# Patient Record
Sex: Male | Born: 1980
Health system: Southern US, Community
[De-identification: ages and names within clinical notes are randomized; demographics above are authoritative.]

## PROBLEM LIST (undated history)

## (undated) DIAGNOSIS — G4730 Sleep apnea, unspecified: Secondary | ICD-10-CM

## (undated) DIAGNOSIS — J45909 Unspecified asthma, uncomplicated: Secondary | ICD-10-CM

## (undated) DIAGNOSIS — I10 Essential (primary) hypertension: Secondary | ICD-10-CM

## (undated) DIAGNOSIS — E7800 Pure hypercholesterolemia, unspecified: Secondary | ICD-10-CM

## (undated) DIAGNOSIS — F909 Attention-deficit hyperactivity disorder, unspecified type: Secondary | ICD-10-CM

## (undated) DIAGNOSIS — K219 Gastro-esophageal reflux disease without esophagitis: Secondary | ICD-10-CM

## (undated) DIAGNOSIS — R000 Tachycardia, unspecified: Secondary | ICD-10-CM

## (undated) DIAGNOSIS — E119 Type 2 diabetes mellitus without complications: Secondary | ICD-10-CM

## (undated) DIAGNOSIS — E46 Unspecified protein-calorie malnutrition: Secondary | ICD-10-CM

## (undated) DIAGNOSIS — E669 Obesity, unspecified: Secondary | ICD-10-CM

## (undated) DIAGNOSIS — E1143 Type 2 diabetes mellitus with diabetic autonomic (poly)neuropathy: Secondary | ICD-10-CM

## (undated) DIAGNOSIS — F419 Anxiety disorder, unspecified: Secondary | ICD-10-CM

## (undated) DIAGNOSIS — G8929 Other chronic pain: Secondary | ICD-10-CM

## (undated) DIAGNOSIS — E049 Nontoxic goiter, unspecified: Secondary | ICD-10-CM

## (undated) DIAGNOSIS — E11649 Type 2 diabetes mellitus with hypoglycemia without coma: Secondary | ICD-10-CM

## (undated) DIAGNOSIS — R519 Headache, unspecified: Secondary | ICD-10-CM

## (undated) DIAGNOSIS — E109 Type 1 diabetes mellitus without complications: Secondary | ICD-10-CM

## (undated) DIAGNOSIS — R5383 Other fatigue: Secondary | ICD-10-CM

## (undated) DIAGNOSIS — IMO0001 Reserved for inherently not codable concepts without codable children: Secondary | ICD-10-CM

## (undated) HISTORY — DX: Unspecified protein-calorie malnutrition: E46

## (undated) HISTORY — DX: Gastro-esophageal reflux disease without esophagitis: K21.9

## (undated) HISTORY — DX: Other chronic pain: G89.29

## (undated) HISTORY — DX: Pure hypercholesterolemia, unspecified: E78.00

## (undated) HISTORY — DX: Anxiety disorder, unspecified: F41.9

## (undated) HISTORY — DX: Attention-deficit hyperactivity disorder, unspecified type: F90.9

## (undated) HISTORY — DX: Obesity, unspecified: E66.9

## (undated) HISTORY — DX: Reserved for inherently not codable concepts without codable children: IMO0001

## (undated) HISTORY — DX: Type 2 diabetes mellitus without complications: E11.9

## (undated) HISTORY — DX: Other fatigue: R53.83

## (undated) HISTORY — DX: Headache, unspecified: R51.9

## (undated) HISTORY — DX: Tachycardia, unspecified: R00.0

## (undated) HISTORY — DX: Essential (primary) hypertension: I10

## (undated) HISTORY — PX: WISDOM TOOTH EXTRACTION: SHX21

## (undated) HISTORY — DX: Type 1 diabetes mellitus without complications: E10.9

## (undated) HISTORY — DX: Type 2 diabetes mellitus with hypoglycemia without coma: E11.649

## (undated) HISTORY — DX: Nontoxic goiter, unspecified: E04.9

## (undated) HISTORY — DX: Sleep apnea, unspecified: G47.30

## (undated) HISTORY — DX: Unspecified asthma, uncomplicated: J45.909

## (undated) HISTORY — PX: REFRACTIVE SURGERY: SHX103

## (undated) HISTORY — DX: Type 2 diabetes mellitus with diabetic autonomic (poly)neuropathy: E11.43

---
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
## 2001-01-04 ENCOUNTER — Encounter: Admission: RE | Admit: 2001-01-04 | Discharge: 2001-01-04 | Payer: Self-pay | Admitting: Family Medicine

## 2001-02-11 ENCOUNTER — Encounter: Admission: RE | Admit: 2001-02-11 | Discharge: 2001-02-11 | Payer: Self-pay | Admitting: Sports Medicine

## 2001-05-04 ENCOUNTER — Encounter: Admission: RE | Admit: 2001-05-04 | Discharge: 2001-05-04 | Payer: Self-pay | Admitting: Family Medicine

## 2002-07-19 ENCOUNTER — Encounter: Admission: RE | Admit: 2002-07-19 | Discharge: 2002-07-19 | Payer: Self-pay | Admitting: Family Medicine

## 2002-09-29 ENCOUNTER — Encounter: Admission: RE | Admit: 2002-09-29 | Discharge: 2002-09-29 | Payer: Self-pay | Admitting: Sports Medicine

## 2003-04-27 ENCOUNTER — Encounter: Admission: RE | Admit: 2003-04-27 | Discharge: 2003-04-27 | Payer: Self-pay | Admitting: Sports Medicine

## 2003-09-19 ENCOUNTER — Encounter: Admission: RE | Admit: 2003-09-19 | Discharge: 2003-09-19 | Payer: Self-pay | Admitting: Sports Medicine

## 2005-08-14 ENCOUNTER — Ambulatory Visit: Payer: Self-pay | Admitting: Sports Medicine

## 2005-09-11 ENCOUNTER — Ambulatory Visit: Payer: Self-pay | Admitting: Sports Medicine

## 2005-09-23 ENCOUNTER — Ambulatory Visit: Payer: Self-pay | Admitting: Family Medicine

## 2006-01-08 ENCOUNTER — Ambulatory Visit: Payer: Self-pay | Admitting: "Endocrinology

## 2006-01-09 ENCOUNTER — Ambulatory Visit: Payer: Self-pay | Admitting: Sports Medicine

## 2006-03-09 ENCOUNTER — Ambulatory Visit: Payer: Self-pay | Admitting: "Endocrinology

## 2006-09-24 ENCOUNTER — Ambulatory Visit: Payer: Self-pay | Admitting: "Endocrinology

## 2006-11-25 ENCOUNTER — Ambulatory Visit: Payer: Self-pay | Admitting: "Endocrinology

## 2006-12-03 ENCOUNTER — Emergency Department (HOSPITAL_COMMUNITY): Admission: EM | Admit: 2006-12-03 | Discharge: 2006-12-03 | Payer: Self-pay | Admitting: Emergency Medicine

## 2006-12-24 DIAGNOSIS — E109 Type 1 diabetes mellitus without complications: Secondary | ICD-10-CM | POA: Insufficient documentation

## 2006-12-24 DIAGNOSIS — J45909 Unspecified asthma, uncomplicated: Secondary | ICD-10-CM | POA: Insufficient documentation

## 2006-12-24 DIAGNOSIS — F909 Attention-deficit hyperactivity disorder, unspecified type: Secondary | ICD-10-CM | POA: Insufficient documentation

## 2007-06-09 ENCOUNTER — Ambulatory Visit: Payer: Self-pay | Admitting: "Endocrinology

## 2008-05-08 ENCOUNTER — Ambulatory Visit: Payer: Self-pay | Admitting: "Endocrinology

## 2008-06-05 ENCOUNTER — Ambulatory Visit: Payer: Self-pay | Admitting: "Endocrinology

## 2008-10-11 ENCOUNTER — Ambulatory Visit: Payer: Self-pay | Admitting: "Endocrinology

## 2008-10-23 ENCOUNTER — Inpatient Hospital Stay (HOSPITAL_COMMUNITY): Admission: EM | Admit: 2008-10-23 | Discharge: 2008-10-25 | Payer: Self-pay | Admitting: Emergency Medicine

## 2008-10-23 ENCOUNTER — Ambulatory Visit: Payer: Self-pay | Admitting: Internal Medicine

## 2008-11-01 ENCOUNTER — Ambulatory Visit: Payer: Self-pay | Admitting: "Endocrinology

## 2008-11-16 ENCOUNTER — Encounter: Admission: RE | Admit: 2008-11-16 | Discharge: 2009-02-14 | Payer: Self-pay | Admitting: "Endocrinology

## 2008-11-24 ENCOUNTER — Ambulatory Visit: Payer: Self-pay | Admitting: "Endocrinology

## 2009-02-21 ENCOUNTER — Ambulatory Visit: Payer: Self-pay | Admitting: "Endocrinology

## 2010-01-07 ENCOUNTER — Ambulatory Visit: Payer: Self-pay | Admitting: "Endocrinology

## 2010-04-19 ENCOUNTER — Ambulatory Visit: Payer: Self-pay | Admitting: "Endocrinology

## 2010-12-03 ENCOUNTER — Ambulatory Visit (INDEPENDENT_AMBULATORY_CARE_PROVIDER_SITE_OTHER): Payer: BC Managed Care – PPO | Admitting: "Endocrinology

## 2010-12-03 DIAGNOSIS — E1065 Type 1 diabetes mellitus with hyperglycemia: Secondary | ICD-10-CM

## 2010-12-03 DIAGNOSIS — E782 Mixed hyperlipidemia: Secondary | ICD-10-CM

## 2010-12-03 DIAGNOSIS — I10 Essential (primary) hypertension: Secondary | ICD-10-CM

## 2010-12-03 DIAGNOSIS — IMO0002 Reserved for concepts with insufficient information to code with codable children: Secondary | ICD-10-CM

## 2011-02-17 ENCOUNTER — Encounter: Payer: Self-pay | Admitting: *Deleted

## 2011-02-17 ENCOUNTER — Other Ambulatory Visit: Payer: Self-pay | Admitting: *Deleted

## 2011-03-03 ENCOUNTER — Ambulatory Visit (INDEPENDENT_AMBULATORY_CARE_PROVIDER_SITE_OTHER): Payer: BC Managed Care – PPO | Admitting: "Endocrinology

## 2011-03-03 DIAGNOSIS — IMO0002 Reserved for concepts with insufficient information to code with codable children: Secondary | ICD-10-CM

## 2011-03-03 DIAGNOSIS — I10 Essential (primary) hypertension: Secondary | ICD-10-CM

## 2011-03-03 DIAGNOSIS — E049 Nontoxic goiter, unspecified: Secondary | ICD-10-CM

## 2011-03-03 DIAGNOSIS — E1065 Type 1 diabetes mellitus with hyperglycemia: Secondary | ICD-10-CM

## 2011-03-03 DIAGNOSIS — E7800 Pure hypercholesterolemia, unspecified: Secondary | ICD-10-CM

## 2011-03-11 NOTE — Discharge Summary (Signed)
[name], [name] NO.:  [id number]   MEDICAL RECORD NO.:  [id number]          PATIENT TYPE:  INP   LOCATION:  2106                         FACILITY:  [facility]   PHYSICIAN:  Manning Charity, MD     DATE OF BIRTH:  1980/11/25   DATE OF ADMISSION:  10/23/2008  DATE OF DISCHARGE:  10/25/2008                               DISCHARGE SUMMARY   DISCHARGE DIAGNOSES:  1. Diabetic ketoacidosis secondary to using expired insulin.  2. Type 1 diabetes.  3. Autonomic neuropathy with intermittent tachycardia.  4. Hypertension.  5. Microalbuminuria.   DISCHARGE MEDICATIONS:  1. NovoLog pump to be administered 1.15 units per hour from the hours      of 5 o'clock a.m. until 1 o'clock p.m., to be administered 1.1      units per hour from 1 o'clock p.m. until midnight, to be      administered 0.8 units per hour from midnight until 5 a.m.  The      patient is also to do mealtime coverage with the NovoLog.  2. Lisinopril 5 mg p.o. daily.   DISPOSITION AND FOLLOWUP:  The patient is to follow up with Dr. Molli Knock, who is an adult and pediatric endocrinologist here in  [place], [place], on November 01, 2008, at 10:30 a.m.  At that  time, the patient will need a BMET to assess his bicarb status.  His  bicarb was 19 at the time of discharge.   PROCEDURES PERFORMED:  1. Chest x-ray on October 23, 2008, revealed no acute disease.  2. Abdominal x-ray on October 23, 2008, revealed substantial midline      bowel gas, which obscures midline retroperitoneal structure,      otherwise, unremarkable exam.   CONSULTATIONS:  David Stall, MD, with Adult and Pediatric  Endocrinology.   BRIEF ADMITTING HISTORY:  The patient is a 30 year old Caucasian male  with history of type 1 diabetes on insulin pump with the last hemoglobin  A1c of 9.1 on October 15, 2008, who presents with nausea and vomiting.  Two days prior to admission, the patient described some abdominal  discomfort that started gradually.  This pain is described as crampy in  nature and is accompanied by nausea.  Approximately 4 o'clock on the day  of admission, the patient awoke with worsening abdominal pain, nausea,  and had approximately 10-12 episodes of clear liquid emesis.  The  patient denies any fever, diarrhea, or sick contacts.  The patient also  states he has been compliant with his insulin regimen.  The patient  states that his capillary blood glucose have been running approximately  300 since the abdominal pain started 2 days prior.   PHYSICAL EXAMINATION:  VITAL SIGNS:  Temperature 98.2, blood pressure  160/85, pulse 136, respirations 25, saturating 98% on room air.  GENERAL:  The patient is to tachypneic and in no acute distress.  EYES:  Pupils equal, round, and reactive to light.  Extraocular  movements intact.  ENT:  Oropharynx clear.  Dry mucous membranes.  NECK:  Supple.  RESPIRATIONS:  Deep rapid respirations.  LUNGS:  Clear to auscultation bilaterally.  Cardiovascular:  Tachy, regular rhythm.  No murmurs, rubs, or gallops.  ABDOMEN:  Soft, nontender, nondistended.  Hypoactive bowel sounds.  No  right upper quadrant tenderness to palpation.  EXTREMITIES:  No clubbing, cyanosis, or edema.  SKIN:  No rashes, no lesions.  LYMPHS:  No lymphadenopathy.  MUSCULOSKELETAL:  No joint abnormalities.  NEURO:  Neuro exam is nonfocal.  PSYCH:  Appropriate.   LABORATORY DATA:  Sodium 132, potassium 6.3, chloride 97, bicarb 6, BUN  24, creatinine 1.70, glucose 509.  White count 21.6, hemoglobin 18.8,  platelets 324.  Initial anion gap is 29, bilirubin 2.1, alk phos 121,  AST 19, ALT 29, protein 8.7, albumin 5.0, calcium 10.2, lipase 15.  Blood acetone is small.  UA shows glucose greater than 1000, ketones  greater than 80, proteins of 100, negative for nitrites, negative for  leukocytes.   HOSPITAL COURSE:  1. Diabetic ketoacidosis.  The patient was admitted to the [facility] for evaluation of his DKA.  On admission, the      patient received aggressive fluid hydration, approximately 4 L in      the emergency department.  He was maintained on IV fluids of 500      mL/hour for the first 2 hours and 250 mL overnight.  In addition,      the patient was placed on an insulin drip.  Initially, it was      unsure what was the precipitating factor of the patient's diabetic      ketoacidosis.  He had no obvious signs of infection or      intoxication.  After later speaking with his mother, she admitted      that she had accidentally given him expired insulin approximately 2      days prior to admission.  The primary team continued to monitor the      patient's capillary blood glucose and basic metabolic panels every      2 hours.  The insulin drip of 1 unit per hour allowed the patient's      bicarb to increase slowly and for his anion gap to close.      Additionally when the glucose levels went below 250, the primary      team started D5 half-normal saline at the recommendations of Dr.      David Stall.  The primary team decided to restart the      patient's insulin pump when his anion gap had closed and he had      come out of DKA.  We obtained a diabetic care consult for him to      evaluate the patient's pump and determine that was in proper      working order.  On the second day of hospitalization, the patient's      pump was restarted at a basal rate of 0.8 units per hour from      midnight to 5:00 a.m., from 5:00 a.m. to 1 o'clock p.m.  The      patient is to receive 1.15 units per hour and from 1 o'clock until      midnight the patient is to receive 1.1 units per hour.  In      addition, the patient must receive mealtime NovoLog coverage.      These are the recommendations of Dr. Nolon Bussing. [facility].  In      addition, the  patient maintained a capillary blood glucoses in the      range from 150-200.  On the day of discharge,  the patient's bicarb      was still stable at level of 19.  We are discharging the patient      with him to receive close followup at the office of Dr. David Stall.  He needs to receive a BMET at the time of followup and to      assess his electrolyte status including his potassium and also his      bicarb.  He has been instructed on the importance of using insulin      that is not expired also and how to use mealtime coverage and to      continue the basal rate, which was taught to him initially by Dr.      Molli Knock.  The patient states he is understanding and      agreeable to this plan.  It should also be mentioned that the      patient was started on a diet on the night prior to discharge,      which he tolerated well and at this time, his dextrose drip was      discontinued.  2. Nausea and vomiting.  This is likely secondary to his DKA.  His      nausea and vomiting was managed with Zofran IV as needed, and also      as soon as his DKA resolved, the patient had no prior episodes of      nausea or vomiting.  3. Leukocytosis.  This is likely secondary to his dehydration and DKA.      This resolved with hydration.  4. Hypokalemia.  This is likely secondary to insulin administration      and emesis.  A magnesium level was checked and found to be within      normal limits, and also the patient's potassium was repleted      orally.  He will need to have a basic metabolic panel drawn at      followup in order to assess his potassium status.  5. Tachycardia.  The patient's tachycardia secondary to a diagnosis of      autonomic neuropathy by Dr. Molli Knock.  His heart rate ranges      anywhere from 80-120 beats per minute.  The patient did not endorse      any chest pain throughout his hospitalization.  He will need to be      followed up and manage accordingly by Dr. Fransico Michael.  6. Hypertension.  The patient has a diagnosis of hypertension by his      endocrinologist,  Dr. Fransico Michael.  His blood pressures at the time of      discharge were 127/70; however, in light of his hypertension and      microalbuminuria, Dr. Fransico Michael had recommended starting lisinopril 5      mg p.o. daily at the time of discharge.  The patient was given a      prescription for his lisinopril, also started on the day of      discharge.  He will need to be followed up and his lisinopril will      need to be managed by his endocrinologist, Dr. Fransico Michael.   DISCHARGE VITAL SIGNS:  Temperature 98.9, blood pressure 127/70, pulse  85, respirations 12, oxygen saturation 100% on room air.   DISCHARGE LABORATORY  DATA:  White count 6.3, hemoglobin 13.2, platelets  167.  Sodium 134, potassium 3.5, chloride 107, bicarb 19, BUN 4,  creatinine 0.67, glucose 165.   The patient is being discharged home in stable and improved condition.      Genia Del, MD  Electronically Signed      Manning Charity, MD  Electronically Signed    ZF/MEDQ  D:  10/25/2008  T:  10/26/2008  Job:  [id number]   cc:   David Stall, M.D.

## 2011-03-11 NOTE — Consult Note (Signed)
NAMETAVARIOUS, FREEL NO.:  [id number]   MEDICAL RECORD NO.:  [id number]          PATIENT TYPE:  INP   LOCATION:  2106                         FACILITY:  [facility]   PHYSICIAN:  David Stall, M.D.DATE OF BIRTH:  02-Nov-1980   DATE OF CONSULTATION:  10/23/2008  DATE OF DISCHARGE:                                 CONSULTATION   SOURCE OF CONSULTATION:  [facility] health staff.   CHIEF COMPLAINT:  Diabetic ketoacidosis.   HISTORY OF PRESENT ILLNESS:  [name] is a 30-1/2-year-old white male  who was admitted today, October 23, 2008, after a two-day illness and  onset of acute nausea and vomiting this morning.  1. [name] was initially diagnosed with type 2 diabetes in 2001 or 2002.      It was later reclassified as type 1 diabetes mellitus and was      followed for several years at [facility].  He was      started on a [employer] paradigm 7/12 insulin pump in the summer or      fall of 2005.  2. He was referred to me in March 2007 by Dr. Annamaria Helling, chief of      the [facility].  Dr. Darrick Penna has seen [facility] in      [facility] for bilateral tears of his meniscus      cartilage in his knees.  When I saw him, he was no more checking      his blood sugars very often, but was instead doing fluid boluses by      his insulin pump.  He does sometimes do correction boluses when he      checked his blood sugars.  His weight at that time was 234.2      pounds, his height was 179.1 cm, and his BMI was 33.2.  Blood      pressure was 128/78.  A hemoglobin A1c was 9.5%.  I noted a goiter.      Subsequent lab tests showed normal TSH values.  A lipid panel was      abnormal with a cholesterol of 257, triglyceride 143, HDL of 49,      and LDL of 179.  His microalbumin-to-creatinine ratio in the urine      was 20.2, which was normal.  3. On subsequent discussion with the patient's mother on February 04, 2006, she revealed that [name]  had extreme ADHD, which made it very      difficult for him to adhere to a diabetes regimen.  [name] had not      disclosed this in his initial visit to [place].  4. The patient did better at working at his diabetes for several      months after I first saw him and his hemoglobin A1c decreased to      8.6% on Feb 27, 2006.  Unfortunately, this improved, was short      lived, and his A1c rose to 11.0 by September 24, 2006.  He never      brought  a blood sugar meter in to be downloaded because he      reportedly lost a blood sugar meter about once a month.  We are not      able to do any adequate pump adjustments because of the lack of      data.  Microalbumin-to-creatinine ratio at that point was 87, which      was elevated beyond the normal 30.  However, on January, 1, 2008,      he was doing a bit better.  His hemoglobin A1c was down to 10.4%.      He then failed to return for followup visits until June 09, 2007,      when his hemoglobin A1c was 11.2%.  He then failed to return for      followup visit until May 08, 2008, at which point, his hemoglobin      A1c was 9.2%.  His history revealed several episodes of      hypoglycemia, which occurred when he was physically active on the      job.  His blood pressure was 134/83.  At that point, he was given a      prescription for lisinopril, which he never filled.  At followup      visit, on June 05, 2008, his meter revealed that he checked his      blood sugars 0-3 times daily.  His blood sugar was mostly in the      300s.  5. At a followup visit on October 01, 2008, the meter showed only      three blood sugar checks in the preceding 30 days.  He was doing 3-      7 boluses per day, but these were almost all fluid boluses.  His      hemoglobin A1c at that time was 10.0%.  He had a fixed tachycardia      with a heart rate of 98 due to autonomic neuropathy.  In my      prospect, his heart rates had been varied from 89-112, but were      mostly  greater than 100 over the past two years.  Blood pressure at      that point was 133/81.  He was again not taking his lisinopril.  I      asked him to resume his medications at that time.  I warned him      about possibilities of diabetic ketoacidosis and hypokalemic      cardiac arrest if he did not take medical care of his blood sugar.      He had a laboratory tests, which he unfortunately did not ever      obtained.  However, the patient's mother called today with      information that he had a GI upset for the past two days and then      he developed intractable nausea and vomiting since about 4:30 this      morning.  I talked with [name], and learnt he did not feel capable      keeping food or fluids down.  I referred him to the emergency      department for evaluation and management and possible acute      gastroenteritis, possible DKA, or possible coaptation of both.  6. When the patient was admitted to the [facility], he reportedly told Dr.      Dimple Nanas that he had been using expired insulin recently.  He told  me that when he visited his parents over the holidays and did a      most recent site change, he inadvertently used insulin, which was      old.   PAST MEDICAL HISTORY:  1. Type 1 diabetes mellitus  2. Goiter.  3. Autonomic neuropathy with tachycardia.  4. Obesity.  5. Hyperlipidemia.  6. Hypertension.  7. Microalbuminuria.  8. Hypoglycemia.   PAST SURGICAL HISTORY:  None.   PSYCHIATRIC HISTORY:  ADHD.  The patient has not been on medicine for  several years.   MEDICATIONS:  1. Lisinopril 5 mg per day, which he is not taking.  2. NovoLog insulin by pump.   SOCIAL HISTORY:  The patient is in his last semester college.  He is  several breaks in order to work.  He lives with his sister in  [place].  His parents also live in [place].  He does not have a  PCP.  He denies using tobacco or illicit drugs.  He drinks alcohol  occasionally.   FAMILY HISTORY:   Positive for type 2 diabetes mellitus in maternal  grandmother and bladder cancer in maternal grandfather.  There is no  family history for atherosclerotic heart disease, strokes, or thyroid  disease.   REVIEW OF SYSTEMS:  The patient feels fairly well at approximately 7  p.m.  He was thirsty, but otherwise doing well.  His nausea, vomiting,  and abdominal pain have resolved.   PHYSICAL EXAMINATION:  VITAL SIGNS:  Heart rate 100, blood pressure  119/55.  GENERAL:  The patient is awake, alert, and oriented to person, place,  and time.  He looks far better than his 1700 labs would have indicated.  EYES:  The eyes are moderately dry.  He has no arcus or proptosis.  MOUTH:  The mouth is moderately dry.  NECK:  There were no bruits present.  He has a goiter.  The goiter was  nontender.  LUNGS:  All lungs are clear.  He moves air well.  HEART:  Heart sounds S1, S2 are normal.  ABDOMEN:  Soft and nontender.  His abdomen is big.  HANDS:  He has an IVM of the dorsum of the right hand, pinkish.  Both  hands are pale.  LEGS:  There is no edema present.  PULSES:  He has 2+ DP pulses.  NEUROLOGIC:  He has 5+ strengths in upper and lower extremities.  Sensation is intact to touch in his feet.   LABORATORY DATA:  On October 24, 2008 at 10:06 a.m., the sodium as 130,  potassium 6.3 with hemolysis, chloride 108, and glucose 529.  Subsequent  BMP sent to the lab showed a sodium of 132, potassium 6.3, chloride of  97, and bicarbonate of 6.  Glucose on that sample was 509 and creatinine  1.7.  Venous pH was 7.053.  Serum acetone was small.  Urinalysis showed  a glucose of greater than 1000 and ketones greater than 80.  Laboratory  data on 16:25 today showed an arterial pH of 7.11.  Laboratory data on  17:55 showed sodium of 138, potassium 4.8, chloride of 114, and bicarb  of 10.  Glucose was 167, creatinine 1.43.   ASSESSMENT:  1. Diabetic ketoacidosis:  The patient feels this episode of  diabetic      ketoacidosis is all due to using expired insulin.  His history of      abdominal discomfort most likely began prior to his most recent  site change.  He has really been in poor control for sometime.  2. Type 1 diabetes mellitus:  The patient has been poorly adherent to      check blood sugar and taking correction boluses.  He had been      taking anywhere from 3-7 fluid boluses per day.  He cannot obtain      adequate control if he continues in this manner.  3. Tachycardia:  Secondary to autonomic neuropathy.  This is his most      obvious and chronic microvascular complication of a poorly      controlled diabetes.  This is also a most common microvascular      complication seen in young adults his age with diabetes.  His      complication is reversible if he will control his blood glucose      values.  4. Goiter:  The patient has been euthyroid in the past.  He has not      had thyroid test done in the recent last year due to his      noncompliance.  I would appreciate if the health staff control has      a set of TSH, free T4, and free T3 during this admission.  5. Hypertension:  The patient was to start lisinopril to treat both      his hypertension and his microalbuminuria.  He should be discharged      home on lisinopril 5 mg per day.  6. Microalbuminuria:  This is potentially reversible if he can receive      reasonable good control of his diabetes and hypertension.  7. Hypoglycemic:  This has occurred occasionally when he guessed wrong      about his insulin doses.  8. Hyperlipidemia:  I suspect this issue will correct without statins      if his blood glucose values are controlled.  If not, he will need      statin therapy.  9. ADHD:  The patient has the intelligent to take better care of      himself and unfortunately he has not forced himself to do so.  He      might benefit from meds for ADHD, but he is unwilling to take any      oral meds for any reason.   10.Dehydration:  This is mild-to-moderate and is resolving.   PLAN:  1. The process that the [facility]U staff and health staff have put in place      is to reverse his DKA and dehydration is working.  2. Tomorrow when he is ready for transfer to the floor, I will      recommend allowing him to restart his insulin pump.  Once he is      stable, he can be discharged.  I am unwilling to follow him on an      outpatient basis.  He will be on call this weekend.  3. The patient most recently has insulin pump setting on October 11, 2008, for a basal rates at midnight at 0.80 units per hour, at 0500      hours on 0.15 units per hour, and at 1300 hours on 1.10 units per      hour.  These insulin collaboration was 1 unit for every 8 g of      carbs.  He has insulin sensitivity factors, 1 unit for every 30      points  blood glucose greater than his target of 100.  4. I will be available at the time the patient is discharged, please      contact me on either pager, [phone] or [phone] and I will be      glad to assist in his outpatient management.           ______________________________  David Stall, M.D.     MJB/MEDQ  D:  10/23/2008  T:  10/24/2008  Job:  [id number]   cc:   [facility], [facility]

## 2011-06-10 ENCOUNTER — Other Ambulatory Visit: Payer: Self-pay | Admitting: "Endocrinology

## 2011-06-24 ENCOUNTER — Ambulatory Visit: Payer: BC Managed Care – PPO | Admitting: "Endocrinology

## 2011-08-01 LAB — BLOOD GAS, ARTERIAL
Acid-base deficit: 23 mmol/L — ABNORMAL HIGH (ref 0.0–2.0)
Drawn by: 276051
O2 Saturation: 98.2 %
Patient temperature: 98.6
TCO2: 5.5 mmol/L (ref 0–100)
pCO2 arterial: 16.3 mmHg — CL (ref 35.0–45.0)

## 2011-08-01 LAB — GLUCOSE, CAPILLARY
Glucose-Capillary: 100 mg/dL — ABNORMAL HIGH (ref 70–99)
Glucose-Capillary: 107 mg/dL — ABNORMAL HIGH (ref 70–99)
Glucose-Capillary: 143 mg/dL — ABNORMAL HIGH (ref 70–99)
Glucose-Capillary: 158 mg/dL — ABNORMAL HIGH (ref 70–99)
Glucose-Capillary: 162 mg/dL — ABNORMAL HIGH (ref 70–99)
Glucose-Capillary: 162 mg/dL — ABNORMAL HIGH (ref 70–99)
Glucose-Capillary: 164 mg/dL — ABNORMAL HIGH (ref 70–99)
Glucose-Capillary: 167 mg/dL — ABNORMAL HIGH (ref 70–99)
Glucose-Capillary: 174 mg/dL — ABNORMAL HIGH (ref 70–99)
Glucose-Capillary: 177 mg/dL — ABNORMAL HIGH (ref 70–99)
Glucose-Capillary: 178 mg/dL — ABNORMAL HIGH (ref 70–99)
Glucose-Capillary: 187 mg/dL — ABNORMAL HIGH (ref 70–99)
Glucose-Capillary: 193 mg/dL — ABNORMAL HIGH (ref 70–99)
Glucose-Capillary: 200 mg/dL — ABNORMAL HIGH (ref 70–99)
Glucose-Capillary: 212 mg/dL — ABNORMAL HIGH (ref 70–99)
Glucose-Capillary: 257 mg/dL — ABNORMAL HIGH (ref 70–99)
Glucose-Capillary: 335 mg/dL — ABNORMAL HIGH (ref 70–99)
Glucose-Capillary: 474 mg/dL — ABNORMAL HIGH (ref 70–99)
Glucose-Capillary: 509 mg/dL (ref 70–99)

## 2011-08-01 LAB — POCT I-STAT 3, VENOUS BLOOD GAS (G3P V)
Acid-base deficit: 22 mmol/L — ABNORMAL HIGH (ref 0.0–2.0)
O2 Saturation: 67 %
TCO2: 7 mmol/L (ref 0–100)
pCO2, Ven: 23.7 mmHg — ABNORMAL LOW (ref 45.0–50.0)

## 2011-08-01 LAB — CBC
HCT: 39.1 % (ref 39.0–52.0)
HCT: 56.8 % — ABNORMAL HIGH (ref 39.0–52.0)
Hemoglobin: 13.2 g/dL (ref 13.0–17.0)
Hemoglobin: 18.8 g/dL — ABNORMAL HIGH (ref 13.0–17.0)
MCHC: 33.8 g/dL (ref 30.0–36.0)
MCHC: 34 g/dL (ref 30.0–36.0)
MCV: 89.2 fL (ref 78.0–100.0)
Platelets: 212 10*3/uL (ref 150–400)
Platelets: 328 10*3/uL (ref 150–400)
RDW: 13.2 % (ref 11.5–15.5)
RDW: 13.2 % (ref 11.5–15.5)
WBC: 10.9 10*3/uL — ABNORMAL HIGH (ref 4.0–10.5)
WBC: 21.3 10*3/uL — ABNORMAL HIGH (ref 4.0–10.5)

## 2011-08-01 LAB — BASIC METABOLIC PANEL
BUN: 12 mg/dL (ref 6–23)
BUN: 4 mg/dL — ABNORMAL LOW (ref 6–23)
BUN: 5 mg/dL — ABNORMAL LOW (ref 6–23)
CO2: 13 mEq/L — ABNORMAL LOW (ref 19–32)
CO2: 16 mEq/L — ABNORMAL LOW (ref 19–32)
CO2: 17 mEq/L — ABNORMAL LOW (ref 19–32)
CO2: 19 mEq/L (ref 19–32)
CO2: <5 mEq/L — CL (ref 19–32)
Calcium: 7.8 mg/dL — ABNORMAL LOW (ref 8.4–10.5)
Calcium: 8.1 mg/dL — ABNORMAL LOW (ref 8.4–10.5)
Calcium: 8.1 mg/dL — ABNORMAL LOW (ref 8.4–10.5)
Calcium: 8.4 mg/dL (ref 8.4–10.5)
Calcium: 8.5 mg/dL (ref 8.4–10.5)
Calcium: 8.5 mg/dL (ref 8.4–10.5)
Calcium: 8.7 mg/dL (ref 8.4–10.5)
Chloride: 106 mEq/L (ref 96–112)
Chloride: 107 mEq/L (ref 96–112)
Chloride: 109 mEq/L (ref 96–112)
Creatinine, Ser: 0.8 mg/dL (ref 0.4–1.5)
Creatinine, Ser: 1.11 mg/dL (ref 0.4–1.5)
GFR calc Af Amer: >60 mL/min (ref >60)
GFR calc Af Amer: >60 mL/min (ref >60)
GFR calc Af Amer: >60 mL/min (ref >60)
GFR calc Af Amer: >60 mL/min (ref >60)
GFR calc non Af Amer: 57 mL/min — ABNORMAL LOW (ref >60)
GFR calc non Af Amer: 58 mL/min — ABNORMAL LOW (ref >60)
GFR calc non Af Amer: 60 mL/min — ABNORMAL LOW (ref >60)
GFR calc non Af Amer: >60 mL/min (ref >60)
GFR calc non Af Amer: >60 mL/min (ref >60)
GFR calc non Af Amer: >60 mL/min (ref >60)
Glucose, Bld: 165 mg/dL — ABNORMAL HIGH (ref 70–99)
Glucose, Bld: 166 mg/dL — ABNORMAL HIGH (ref 70–99)
Glucose, Bld: 167 mg/dL — ABNORMAL HIGH (ref 70–99)
Glucose, Bld: 171 mg/dL — ABNORMAL HIGH (ref 70–99)
Glucose, Bld: 189 mg/dL — ABNORMAL HIGH (ref 70–99)
Glucose, Bld: 199 mg/dL — ABNORMAL HIGH (ref 70–99)
Glucose, Bld: 211 mg/dL — ABNORMAL HIGH (ref 70–99)
Glucose, Bld: 98 mg/dL (ref 70–99)
Potassium: 3.2 mEq/L — ABNORMAL LOW (ref 3.5–5.1)
Potassium: 3.5 mEq/L (ref 3.5–5.1)
Potassium: 4.1 mEq/L (ref 3.5–5.1)
Potassium: 4.5 mEq/L (ref 3.5–5.1)
Potassium: 4.8 mEq/L (ref 3.5–5.1)
Potassium: 5.8 mEq/L — ABNORMAL HIGH (ref 3.5–5.1)
Sodium: 128 mEq/L — ABNORMAL LOW (ref 135–145)
Sodium: 129 mEq/L — ABNORMAL LOW (ref 135–145)
Sodium: 134 mEq/L — ABNORMAL LOW (ref 135–145)
Sodium: 134 mEq/L — ABNORMAL LOW (ref 135–145)
Sodium: 134 mEq/L — ABNORMAL LOW (ref 135–145)
Sodium: 138 mEq/L (ref 135–145)
Sodium: 140 mEq/L (ref 135–145)

## 2011-08-01 LAB — BILIRUBIN, FRACTIONATED(TOT/DIR/INDIR)
Bilirubin, Direct: <0.1 mg/dL (ref 0.0–0.3)
Total Bilirubin: 1.9 mg/dL — ABNORMAL HIGH (ref 0.3–1.2)

## 2011-08-01 LAB — COMPREHENSIVE METABOLIC PANEL
ALT: 29 U/L (ref 0–53)
Albumin: 5 g/dL (ref 3.5–5.2)
Alkaline Phosphatase: 121 U/L — ABNORMAL HIGH (ref 39–117)
BUN: 8 mg/dL (ref 6–23)
CO2: 19 mEq/L (ref 19–32)
Chloride: 114 mEq/L — ABNORMAL HIGH (ref 96–112)
Chloride: 97 mEq/L (ref 96–112)
Creatinine, Ser: 1.11 mg/dL (ref 0.4–1.5)
GFR calc non Af Amer: >60 mL/min (ref >60)
Glucose, Bld: 145 mg/dL — ABNORMAL HIGH (ref 70–99)
Potassium: 6.3 mEq/L (ref 3.5–5.1)
Sodium: 132 mEq/L — ABNORMAL LOW (ref 135–145)
Total Bilirubin: 1.3 mg/dL — ABNORMAL HIGH (ref 0.3–1.2)
Total Bilirubin: 2.1 mg/dL — ABNORMAL HIGH (ref 0.3–1.2)
Total Protein: 8.7 g/dL — ABNORMAL HIGH (ref 6.0–8.3)

## 2011-08-01 LAB — URINE MICROSCOPIC-ADD ON

## 2011-08-01 LAB — POCT I-STAT, CHEM 8
Calcium, Ion: 1.14 mmol/L (ref 1.12–1.32)
Creatinine, Ser: 1.2 mg/dL (ref 0.4–1.5)
Glucose, Bld: 529 mg/dL (ref 70–99)
Hemoglobin: 20.7 g/dL — ABNORMAL HIGH (ref 13.0–17.0)
TCO2: 5 mmol/L (ref 0–100)

## 2011-08-01 LAB — POCT I-STAT 3, ART BLOOD GAS (G3+)
Bicarbonate: 3.5 mEq/L — ABNORMAL LOW (ref 20.0–24.0)
O2 Saturation: 96 %
Patient temperature: 98.2
TCO2: <5 mmol/L (ref 0–100)

## 2011-08-01 LAB — CULTURE, BLOOD (ROUTINE X 2): Culture: NO GROWTH

## 2011-08-01 LAB — DIFFERENTIAL
Basophils Absolute: 0.1 10*3/uL (ref 0.0–0.1)
Basophils Relative: 0 % (ref 0–1)
Eosinophils Absolute: 0 10*3/uL (ref 0.0–0.7)
Eosinophils Relative: 0 % (ref 0–5)
Monocytes Absolute: 0.7 10*3/uL (ref 0.1–1.0)
Monocytes Relative: 3 % (ref 3–12)

## 2011-08-01 LAB — RAPID URINE DRUG SCREEN, HOSP PERFORMED
Barbiturates: NOT DETECTED
Benzodiazepines: NOT DETECTED
Cocaine: NOT DETECTED

## 2011-08-01 LAB — CK TOTAL AND CKMB (NOT AT ARMC)
CK, MB: 1.2 ng/mL (ref 0.3–4.0)
Total CK: 42 U/L (ref 7–232)

## 2011-08-01 LAB — URINALYSIS, ROUTINE W REFLEX MICROSCOPIC
Bilirubin Urine: NEGATIVE
Ketones, ur: >80 mg/dL — AB
Nitrite: NEGATIVE
Protein, ur: 100 mg/dL — AB
pH: 5.5 (ref 5.0–8.0)

## 2011-08-01 LAB — LACTIC ACID, PLASMA: Lactic Acid, Venous: 1.6 mmol/L (ref 0.5–2.2)

## 2011-08-01 LAB — KETONES, QUALITATIVE

## 2011-08-01 LAB — CARDIAC PANEL(CRET KIN+CKTOT+MB+TROPI)
Total CK: 59 U/L (ref 7–232)
Troponin I: <0.01 ng/mL (ref 0.00–0.06)

## 2011-08-01 LAB — MAGNESIUM: Magnesium: 1.8 mg/dL (ref 1.5–2.5)

## 2011-08-01 LAB — TSH: TSH: 1.107 u[IU]/mL (ref 0.350–4.500)

## 2011-08-20 ENCOUNTER — Other Ambulatory Visit: Payer: Self-pay | Admitting: "Endocrinology

## 2011-09-04 ENCOUNTER — Ambulatory Visit (INDEPENDENT_AMBULATORY_CARE_PROVIDER_SITE_OTHER): Payer: No Typology Code available for payment source | Admitting: "Endocrinology

## 2011-09-04 ENCOUNTER — Encounter: Payer: Self-pay | Admitting: "Endocrinology

## 2011-09-04 VITALS — BP 127/87 | HR 99 | Wt 225.0 lb

## 2011-09-04 DIAGNOSIS — I10 Essential (primary) hypertension: Secondary | ICD-10-CM

## 2011-09-04 DIAGNOSIS — R000 Tachycardia, unspecified: Secondary | ICD-10-CM

## 2011-09-04 DIAGNOSIS — E1169 Type 2 diabetes mellitus with other specified complication: Secondary | ICD-10-CM

## 2011-09-04 DIAGNOSIS — E049 Nontoxic goiter, unspecified: Secondary | ICD-10-CM

## 2011-09-04 DIAGNOSIS — E11649 Type 2 diabetes mellitus with hypoglycemia without coma: Secondary | ICD-10-CM

## 2011-09-04 DIAGNOSIS — IMO0002 Reserved for concepts with insufficient information to code with codable children: Secondary | ICD-10-CM

## 2011-09-04 DIAGNOSIS — E1149 Type 2 diabetes mellitus with other diabetic neurological complication: Secondary | ICD-10-CM

## 2011-09-04 DIAGNOSIS — E1143 Type 2 diabetes mellitus with diabetic autonomic (poly)neuropathy: Secondary | ICD-10-CM

## 2011-09-04 DIAGNOSIS — E1142 Type 2 diabetes mellitus with diabetic polyneuropathy: Secondary | ICD-10-CM

## 2011-09-04 DIAGNOSIS — E7800 Pure hypercholesterolemia, unspecified: Secondary | ICD-10-CM

## 2011-09-04 DIAGNOSIS — E1065 Type 1 diabetes mellitus with hyperglycemia: Secondary | ICD-10-CM

## 2011-09-04 DIAGNOSIS — E7801 Familial hypercholesterolemia: Secondary | ICD-10-CM

## 2011-09-04 DIAGNOSIS — G909 Disorder of the autonomic nervous system, unspecified: Secondary | ICD-10-CM

## 2011-09-04 LAB — GLUCOSE, POCT (MANUAL RESULT ENTRY): POC Glucose: 288

## 2011-09-04 MED ORDER — GLUCOSE BLOOD VI STRP: ORAL_STRIP | Status: DC

## 2011-09-04 MED ORDER — LISINOPRIL 5 MG PO TABS: 5.0000 mg | ORAL_TABLET | Freq: Every day | ORAL | Status: DC

## 2011-09-04 MED ORDER — ROSUVASTATIN CALCIUM 10 MG PO TABS: 10.0000 mg | ORAL_TABLET | Freq: Every day | ORAL | Status: DC

## 2011-09-04 NOTE — Progress Notes (Signed)
Subjective:  Patient Name: [name] Date of Birth: [birth year]  MRN: [id number]  [name]  presents to the office today for follow-up of his type 1 diabetes mellitus, goiter, obesity, hypercholesterolemia, ADHD, fa the patient was first referred to me on 01/08/2006 for evaluation and management of his type 1 diabetes related problems. Recurrent provider was Dr. Doristine Church feels, chairman of family medicine. tigue, autonomic neuropathy, tachycardia, hypertension, microalbuminuria, and hypoglycemia.  HISTORY OF PRESENT ILLNESS:   [name] is a 30 y.o. Caucasian young man. [name] was unaccompanied.  1. The patient was first referred to me on 01/08/2006 by his family physician, Dr. Roanna Epley, for evaluation and management of type 1 diabetes mellitus and related problems. Patient was 30 years old.  A. The patient had been diagnosed with type 1 diabetes somewhere in 2001-2002, at the age of [age]. He was initially diagnosed with type 2 diabetes mellitus, but was later re-classified as type 1 diabetes mellitus. He had been followed by Dr. Arther Dames, staff endocrinologist at [facility]. Patient was started on an insulin pump approximately 18 months prior. His pump was a Medtronic Paradigm 712. His blood glucose control was fair-poor. He was frequently not checking blood sugars as often as he needed to or taking insulin boluses as much as he needed to. He frequently noted fast heart rate. The patient's past medical history was positive for hypertension, for which he was taking lisinopril, and for what his mother called "extreme ADHD". He had previously been taking ADHD medicines but had discontinued them. He had prior ankle injuries and knee injuries, to included tears of the lateral menisci bilaterally. He had not had any surgeries. He was working for a [employer] that had many different jobs sites in different is parts of the [place] and in other countries as well. As a result, the patient was on the road  a lot. He did not use tobacco or drugs, but did drink alcohol occasionally.  B. On physical examination, his weight was 234 pounds, his height was 70 inches, his BMI was 33.2. Blood pressure was 128/70. Global A1c was 9.5%. Heart rate was 96. He had normal affect and fair insight. He had a 25-30 g goiter. He had normal 1+ DP pulses in his feet. He had normal sensation in his feet to touch, vibration, and monofilament. is CMP was normal except for glucose of 251. His cholesterol was 257, triglycerides 143, HDL 49, and LDL 179. TSH was 1.017, free T4 was 1.06, and free T3 was 3.3. His urinary microalbumin: Creatinine ratio was 20.1 (normal less than 30).   C. The patient clearly needed better blood glucose control, which would only occur if he had better adherence to his plan. He appeared to have autonomic neuropathy, manifested by tachycardia.  His thyroid goiter suggested that he might have evolving Hashimoto's disease. He stated that he had lost some weight recently. I encouraged him to check his blood sugars more frequently and to take both correction boluses and food boluses. 2. During the past five years, the patient blood glucose control has occasionally been better, but frequently been worse. His hemoglobin A1c values have varied from 8.3-11.2%. In the last year, however, his A1c's have varied from 9.6-9.9%. Throughout the 5 year period, the patient's autonomic neuropathy and tachycardia have remained essentially the same. On 12/03/10 his total cholesterol was 270, triglycerides 94, HDL 50, and LDL 201. I started him on Crestor, 10 mg per day. However at the time of his last visit on  03/03/11, the total cholesterol was 244, triglycerides 102, HDL 40, and LDL 184. Unfortunately, he had been without Crestor for 10 days prior to those lab tests.  3. The patient's last [facility] visit was on 03/03/11.  In the interim, he has been having some left shoulder problems. He hurt the shoulder in an injury at work in September  of this year. He has not sought medical care for it. He is using Novolog aspart insulin in his insulin pump. He is supposed to be taking lisinopril, 5 mg/day and Crestor, 10 mg/day, but frequently misses doses when he is on the road.  3. Pertinent Review of Systems: Constitutional: The patient feels "okay. He has not been exercising much recently. He feels better when he exercises. He has been trying to eat better.  Eyes: Vision is good. There are no significant eye complaints. Last exam was about 6 moths ago.  Neck: The patient has no complaints of anterior neck swelling, soreness, tenderness,  pressure, discomfort, or difficulty swallowing.  Heart: Heart rate increases with exercise or other physical activity. The patient has no complaints of palpitations, irregular heat beats, chest pain, or chest pressure. Gastrointestinal: Bowel movents seem normal. The patient has no complaints of excessive hunger, acid reflux, upset stomach, stomach aches or pains, diarrhea, or constipation. Legs: Muscle mass and strength seem normal. There are no complaints of numbness, tingling, burning, or pain. No edema is noted. Feet: There are no obvious foot problems. There are no complaints of numbness, tingling, burning, or pain. No edema is noted. GU: No problems with libido or performance. Hypoglycemia: occurs 1-2 X per week. Usually about 0100-0200 if he is up late working.  4. BG printout: Sometimes checks 3-4 times/day, sometimes goes without checking BGs for more than 24 hours. Sometimes boluses 3-7 times/day, but sometimes only 1-2 times/day. Sometimes goes 5 days between site changes.    PAST MEDICAL, FAMILY, AND SOCIAL HISTORY:  Past Medical History  Diagnosis Date  . Type 1 diabetes mellitus not at goal   . Goiter   . Obesity   . Hypercholesterolemia   . ADHD (attention deficit hyperactivity disorder)   . Fatigue   . Tachycardia   . Autonomic neuropathy due to diabetes   . Hypertension   .  Uncontrolled DM with microalbuminuria or microproteinuria   . Hypoglycemia associated with diabetes     Family History  Problem Relation Age of Onset  . Diabetes Maternal Grandmother     T2 DM  . Cancer Maternal Grandfather   . Thyroid disease Neg Hx     Current outpatient prescriptions:glucose blood (ONE TOUCH ULTRA TEST) test strip, Use as instructed, Disp: 200 each, Rfl: 6;  lisinopril (PRINIVIL,ZESTRIL) 5 MG tablet, Take 1 tablet (5 mg total) by mouth daily., Disp: 90 tablet, Rfl: 3;  rosuvastatin (CRESTOR) 10 MG tablet, Take 1 tablet (10 mg total) by mouth daily., Disp: 90 tablet, Rfl: 3 NOVOLOG 100 UNIT/ML injection, USE 300 UNITS EVERY 48 TO 72 HOURS IN INSULIN PUMP AS DIRECTED, Disp: 50 mL, Rfl: 3  Allergies as of 09/04/2011  . (No Known Allergies)    1. Work and Family: Still on the road a lot with his [employer]. He will hopefully not be on the road as much in one year. 2. Activities: Not much physical activity 3. Smoking, alcohol, or drugs: Occasional beer. No tobacco or drugs. 4. Primary Care Provider: None  ROS: There are no other significant problems involving Jylan's other body systems.   Objective:  Vital Signs:  BP 127/87  Pulse 99  Wt 225 lb (102.059 kg)   Ht Readings from Last 3 Encounters:  No data found for Ht   Wt Readings from Last 3 Encounters:  09/04/11 225 lb (102.059 kg)   PHYSICAL EXAM:  Constitutional: The patient appears obese, but otherwise healthy. Face: The face appears normal.  Eyes: There is no obvious arcus or proptosis. Moisture appears normal. Mouth: The oropharynx and tongue appear normal. Oral moisture is normal. Neck: The neck appears to be visibly normal. No carotid bruits are noted. The thyroid gland is 25 grams in size. The consistency of the thyroid gland is relatively firm. The thyroid gland is not tender to palpation. Lungs: The lungs are clear to auscultation. Air movement is good. Heart: Heart rate and rhythm  are regular. Heart sounds S1 and S2 are normal. I did not appreciate any pathologic cardiac murmurs. Abdomen: The abdomen appears to be normal in size. Bowel sounds are normal. There is no obvious hepatomegaly, splenomegaly, or other mass effect.  Arms: Muscle size and bulk are normal for age. Hands: There is no obvious tremor. Phalangeal and metacarpophalangeal joints are normal. Palmar muscles are normal. Palmar skin is normal. Palmar moisture is also normal. Legs: Muscles appear normal for age. No edema is present. Feet: Feet are normally formed. Dorsalis pedal pulses are normal 1-2+ bilaterally. Neurologic: Strength is normal for age in both the upper and lower extremities. Muscle tone is normal. Sensation to touch is normal in both the legs and feet.    LAB DATA: Hemoglobin A1c is 9.6%.   Assessment and Plan:   ASSESSMENT:  1. T1DM: He is missing many opportunities to take enough insulin and to tighten up on BG control. 2. Hypoglycemia: Occasional, not severe. 3. Hypertension: Needs to take meds regularly. 4. Hyperlipidemia: Needs to take meds regularly. 5. Autonomic neuropathy and tachycardia: Reversible if he get BGs under control. 6. Obesity: Needs to eat right and to exercise right. 7. Goiter: needs TFTs  PLAN:  1. Diagnostic: Surveillance labs prior to next visit. 2. Therapeutic: Follow care plan. 3. Patient education: Discussed slow progression of long-term complications, especially when HTN and hyperlipidemia are co-morbidities. 4. Follow-up: 3 months  Level of Service: This visit lasted in excess of 40 minutes. More than 50% of the visit was devoted to counseling.  David Stall, MD 12/11/2011 3:33 PM

## 2011-09-04 NOTE — Patient Instructions (Addendum)
Followup visit in 3 months. Please have lab tests done about 2 weeks prior to next visit. Please fast after 10 PM at night, except for water, on the night prior to lab draw.

## 2011-10-31 ENCOUNTER — Other Ambulatory Visit: Payer: Self-pay | Admitting: "Endocrinology

## 2011-12-08 ENCOUNTER — Ambulatory Visit: Payer: No Typology Code available for payment source | Admitting: "Endocrinology

## 2011-12-11 ENCOUNTER — Encounter: Payer: Self-pay | Admitting: "Endocrinology

## 2011-12-11 DIAGNOSIS — R000 Tachycardia, unspecified: Secondary | ICD-10-CM | POA: Insufficient documentation

## 2011-12-11 DIAGNOSIS — E11649 Type 2 diabetes mellitus with hypoglycemia without coma: Secondary | ICD-10-CM | POA: Insufficient documentation

## 2011-12-11 DIAGNOSIS — E1043 Type 1 diabetes mellitus with diabetic autonomic (poly)neuropathy: Secondary | ICD-10-CM | POA: Insufficient documentation

## 2011-12-11 DIAGNOSIS — I10 Essential (primary) hypertension: Secondary | ICD-10-CM | POA: Insufficient documentation

## 2011-12-11 DIAGNOSIS — IMO0001 Reserved for inherently not codable concepts without codable children: Secondary | ICD-10-CM | POA: Insufficient documentation

## 2011-12-11 DIAGNOSIS — R5383 Other fatigue: Secondary | ICD-10-CM | POA: Insufficient documentation

## 2011-12-11 DIAGNOSIS — F909 Attention-deficit hyperactivity disorder, unspecified type: Secondary | ICD-10-CM | POA: Insufficient documentation

## 2011-12-11 DIAGNOSIS — E7800 Pure hypercholesterolemia, unspecified: Secondary | ICD-10-CM | POA: Insufficient documentation

## 2011-12-11 DIAGNOSIS — E049 Nontoxic goiter, unspecified: Secondary | ICD-10-CM | POA: Insufficient documentation

## 2011-12-11 DIAGNOSIS — E109 Type 1 diabetes mellitus without complications: Secondary | ICD-10-CM | POA: Insufficient documentation

## 2012-01-21 ENCOUNTER — Ambulatory Visit (INDEPENDENT_AMBULATORY_CARE_PROVIDER_SITE_OTHER): Payer: No Typology Code available for payment source | Admitting: "Endocrinology

## 2012-01-21 ENCOUNTER — Encounter: Payer: Self-pay | Admitting: "Endocrinology

## 2012-01-21 VITALS — BP 118/74 | HR 93 | Wt 241.2 lb

## 2012-01-21 DIAGNOSIS — E1149 Type 2 diabetes mellitus with other diabetic neurological complication: Secondary | ICD-10-CM

## 2012-01-21 DIAGNOSIS — E782 Mixed hyperlipidemia: Secondary | ICD-10-CM

## 2012-01-21 DIAGNOSIS — G909 Disorder of the autonomic nervous system, unspecified: Secondary | ICD-10-CM

## 2012-01-21 DIAGNOSIS — R5383 Other fatigue: Secondary | ICD-10-CM

## 2012-01-21 DIAGNOSIS — E669 Obesity, unspecified: Secondary | ICD-10-CM

## 2012-01-21 DIAGNOSIS — E1169 Type 2 diabetes mellitus with other specified complication: Secondary | ICD-10-CM

## 2012-01-21 DIAGNOSIS — E049 Nontoxic goiter, unspecified: Secondary | ICD-10-CM

## 2012-01-21 DIAGNOSIS — E1065 Type 1 diabetes mellitus with hyperglycemia: Secondary | ICD-10-CM

## 2012-01-21 DIAGNOSIS — E11649 Type 2 diabetes mellitus with hypoglycemia without coma: Secondary | ICD-10-CM

## 2012-01-21 DIAGNOSIS — R000 Tachycardia, unspecified: Secondary | ICD-10-CM

## 2012-01-21 DIAGNOSIS — I10 Essential (primary) hypertension: Secondary | ICD-10-CM

## 2012-01-21 DIAGNOSIS — E1143 Type 2 diabetes mellitus with diabetic autonomic (poly)neuropathy: Secondary | ICD-10-CM

## 2012-01-21 DIAGNOSIS — R5381 Other malaise: Secondary | ICD-10-CM

## 2012-01-21 DIAGNOSIS — E1142 Type 2 diabetes mellitus with diabetic polyneuropathy: Secondary | ICD-10-CM

## 2012-01-21 DIAGNOSIS — IMO0002 Reserved for concepts with insufficient information to code with codable children: Secondary | ICD-10-CM

## 2012-01-21 MED ORDER — INSULIN ASPART 100 UNIT/ML ~~LOC~~ SOLN: 100.0000 [IU] | Freq: Three times a day (TID) | SUBCUTANEOUS | Status: DC

## 2012-01-21 MED ORDER — ROSUVASTATIN CALCIUM 10 MG PO TABS
10.0000 mg | ORAL_TABLET | Freq: Every day | ORAL | Status: DC
Start: 2012-01-21 — End: 2012-11-08

## 2012-01-21 MED ORDER — GLUCOSE BLOOD VI STRP: ORAL_STRIP | Status: DC

## 2012-01-21 MED ORDER — INSULIN ASPART 100 UNIT/ML ~~LOC~~ SOLN: 50.0000 [IU] | Freq: Three times a day (TID) | SUBCUTANEOUS | Status: DC

## 2012-01-21 MED ORDER — LISINOPRIL 5 MG PO TABS: 5.0000 mg | ORAL_TABLET | Freq: Every day | ORAL | Status: DC

## 2012-01-21 NOTE — Progress Notes (Signed)
Subjective:  Patient Name: [name] Date of Birth: [birth year]  MRN: [id number]  Hamzeh Tall  presents to the office today for follow-up of his type 1 diabetes mellitus, goiter, obesity, hypercholesterolemia, ADHD, fatigue, autonomic neuropathy, tachycardia, hypertension, microalbuminuria, and hypoglycemia.  HISTORY OF PRESENT ILLNESS:   [name] is a 31 y.o. Caucasian young man. [name] was unaccompanied.  1. The patient was first referred to me on 01/08/2006 by his family physician, Dr. Roanna Epley, for evaluation and management of type 1 diabetes mellitus and related problems. Patient was 31 years old.  A. The patient had been diagnosed with type 1 diabetes somewhere in 2001-2002, at the age of 64-19. He was initially diagnosed with type 2 diabetes mellitus, but was later re-classified as type 1 diabetes mellitus. He had been followed by Dr. Arther Dames, staff endocrinologist at [facility]. Patient was started on an insulin pump approximately 18 months prior. His pump was a Medtronic Paradigm 712. His blood glucose control was fair-poor. He was frequently not checking blood sugars as often as he needed to or taking insulin boluses as much as he needed to. He frequently noted fast heart rate. The patient's past medical history was positive for hypertension, for which he was taking lisinopril, and for what his mother called "extreme ADHD". He had previously been taking ADHD medicines but had discontinued them. He had prior ankle injuries and knee injuries, to included tears of the lateral menisci bilaterally. He had not had any surgeries. He was working for a [employer] that had many different jobs sites in different parts of the [place] and in other countries as well. As a result, the patient was on the road a lot. He did not use tobacco or drugs, but did drink alcohol occasionally.  B. On physical examination, his weight was 234 pounds, his height was 70 inches, his BMI was 33.2. Blood pressure  was 128/70. Hemoglobin A1c was 9.5%. Heart rate was 96. He had normal affect and fair insight. He had a 25-30 g goiter. He had normal 1+ DP pulses in his feet. He had normal sensation in his feet to touch, vibration, and monofilament. His CMP was normal except for glucose of 251. His cholesterol was 257, triglycerides 143, HDL 49, and LDL 179. TSH was 1.017, free T4 was 1.06, and free T3 was 3.3. His urinary microalbumin: Creatinine ratio was 20.1 (normal less than 30).   C. The patient clearly needed better blood glucose control, which would only occur if he had better adherence to his plan. He appeared to have autonomic neuropathy, manifested by tachycardia.  His thyroid goiter suggested that he might have evolving Hashimoto's disease. He stated that he had lost some weight recently. I encouraged him to check his blood sugars more frequently and to take both correction boluses and food boluses. 2. During the past five years, the patient's blood glucose control has occasionally been better, but frequently been worse. His hemoglobin A1c values have varied from 8.3-11.2%. In the last year, however, his A1c's have varied from 9.6-9.9%. Throughout the 5 year period, the patient's autonomic neuropathy and tachycardia have remained essentially the same. On 12/03/10 his total cholesterol was 270, triglycerides 94, HDL 50, and LDL 201. I started him on Crestor, 10 mg per day. However at the time of his visit on 03/03/11, the total cholesterol was 244, triglycerides 102, HDL 40, and LDL 184. Unfortunately, he had been without Crestor for 10 days prior to those lab tests.  3. At the patient's [facility]  visit on 09/04/11, he had been having some left shoulder problems. His efforts to strengthen the shoulder muscles have helped. He is not bothered by shoulder problems much now.  He is using Novolog aspart insulin in his insulin pump. He is taking lisinopril, 5 mg/day and Crestor, 10 mg/day every day. He developed a "chest cold"  about four weeks ago. He had a lot of coughing and mucus production for 1-2 weeks, got better, but now has some nasal congestion and some wheezing, especially with exercise. He is also quite tired. He is being much more careful with his diet. He stopped eating out. He now only has a beer about once every two weeks. He also reduced his consumption of dairy products because they gave him "bad gas". 3. Pertinent Review of Systems: Constitutional: The patient feels "okay.  Eyes: Vision is good. There are no significant eye complaints. Last exam was about  4 months ago.  Neck: The patient has no complaints of anterior neck swelling, soreness, tenderness,  pressure, discomfort, or difficulty swallowing.  Heart: Heart rate increases with exercise or other physical activity. The patient has no complaints of palpitations, irregular heat beats, chest pain, or chest pressure. Gastrointestinal: Bowel movents seem normal. The patient has no complaints of excessive hunger, acid reflux, upset stomach, stomach aches or pains, diarrhea, or constipation. Legs: Muscle mass and strength seem normal. There are no complaints of numbness, tingling, burning, or pain. No edema is noted. Feet: There are no obvious foot problems. There are no complaints of numbness, tingling, burning, or pain. No edema is noted. GU: No problems with libido or performance. Hypoglycemia: occurs occasionally, especially if he miscounts his carbs.    4. BG printout: Sometimes checks 3-4 times/day, sometimes goes without checking BGs for more than 48 hours. Sometimes boluses 3-7 times/day, but sometimes only 1-2 times/day. Sometimes goes 5 days between site changes.    PAST MEDICAL, FAMILY, AND SOCIAL HISTORY:  Past Medical History  Diagnosis Date  . Type 1 diabetes mellitus not at goal   . Goiter   . Obesity   . Hypercholesterolemia   . ADHD (attention deficit hyperactivity disorder)   . Fatigue   . Tachycardia   . Autonomic neuropathy due  to diabetes   . Hypertension   . Uncontrolled DM with microalbuminuria or microproteinuria   . Hypoglycemia associated with diabetes     Family History  Problem Relation Age of Onset  . Diabetes Maternal Grandmother     T2 DM  . Cancer Maternal Grandfather   . Thyroid disease Neg Hx     Current outpatient prescriptions:glucose blood (ONE TOUCH ULTRA TEST) test strip, Use as instructed, Disp: 200 each, Rfl: 6;  lisinopril (PRINIVIL,ZESTRIL) 5 MG tablet, Take 1 tablet (5 mg total) by mouth daily., Disp: 90 tablet, Rfl: 3;  NOVOLOG 100 UNIT/ML injection, USE 300 UNITS EVERY 48 TO 72 HOURS IN INSULIN PUMP AS DIRECTED, Disp: 50 mL, Rfl: 3 rosuvastatin (CRESTOR) 10 MG tablet, Take 1 tablet (10 mg total) by mouth daily., Disp: 90 tablet, Rfl: 3  Allergies as of 01/21/2012  . (No Known Allergies)    1. Work and Family: He will not be traveling for his job as frequently in the future. He will soon take a 3-week vacation to [place], [place], and [place]. 2. Activities: He has been lifting weights recently, but has not been doing much cardio.  3. Smoking, alcohol, or drugs: Occasional beer. No tobacco or drugs. 4. Primary Care Provider: None  ROS: There are no other significant problems involving Jermarcus's other body systems.   Objective:  Vital Signs:  BP 118/74  Pulse 93  Wt 241 lb 3.2 oz (109.408 kg)   Ht Readings from Last 3 Encounters:  No data found for Ht   Wt Readings from Last 3 Encounters:  01/21/12 241 lb 3.2 oz (109.408 kg)  09/04/11 225 lb (102.059 kg)   PHYSICAL EXAM:  Constitutional: The patient appears obese, but otherwise healthy. Face: The face appears normal.  Eyes: There is no obvious arcus or proptosis. Moisture appears normal. Mouth: The oropharynx and tongue appear normal. Oral moisture is normal. Neck: The neck appears to be visibly normal. No carotid bruits are noted. The thyroid gland is 25-30 grams in size. The consistency of the thyroid gland is  relatively firm. The thyroid gland is not tender to palpation. Lungs: The lungs are clear to auscultation. Air movement is good. There were no rhonchi or wheezes. Heart: Heart rate and rhythm are regular. Heart sounds S1 and S2 are normal. I did not appreciate any pathologic cardiac murmurs. Abdomen: The abdomen is enlarged. Bowel sounds are normal. There is no obvious hepatomegaly, splenomegaly, or other mass effect.  Arms: Muscle size and bulk are normal for age. He has a subcutaneous nodule of his left forearm which feels like a lipoma, but is in the area where he has forearm hairs, so it could also be a sebaceous cyst.  Hands: There is a trace tremor. Phalangeal and metacarpophalangeal joints are normal. Palmar muscles are normal. Palmar skin is normal. Palmar moisture is also normal. Legs: Muscles appear normal for age. No edema is present. Feet: Feet are normally formed. Dorsalis pedal pulses are normal 2+ bilaterally. Neurologic: Strength is normal for age in both the upper and lower extremities. Muscle tone is normal. Sensation to touch is normal in both the legs and feet.    LAB DATA: Hemoglobin A1c is 10.1%, compared to 9.6% at last visit.   Assessment and Plan:   ASSESSMENT:  1. T1DM: He is missing many opportunities to take enough insulin and to tighten up on BG control. He has also been sick recently. 2. Hypoglycemia: Occasional, not severe. 3. Hypertension: Needs to take meds regularly. 4. Hyperlipidemia:This will improve as he improves his diet.  5. Autonomic neuropathy and tachycardia: Reversible if he get BGs under control. 6. Obesity: Weight has increased. Needs to eat right and to exercise right. 7. Goiter: needs TFTs 8. Fatigue: Likely some residual mild bronchitis, but could have hepatic dysfunction or hypothyroidism.  PLAN:  1. Diagnostic: Surveillance labs today. 2. Therapeutic: Follow care plan. 3. Patient education: Discussed slow progression of long-term  complications, especially when HTN and hyperlipidemia are co-morbidities. 4. Follow-up: 3 months  Level of Service: This visit lasted in excess of 90 minutes. More than 50% of the visit was devoted to counseling.  David Stall, MD 01/21/2012 11:15 AM

## 2012-01-21 NOTE — Patient Instructions (Signed)
Follow up visit in 3 months. 

## 2012-01-22 LAB — CBC WITH DIFFERENTIAL/PLATELET
Eosinophils Absolute: 0.1 10*3/uL (ref 0.0–0.7)
Eosinophils Relative: 1 % (ref 0–5)
HCT: 51.3 % (ref 39.0–52.0)
Lymphs Abs: 1.7 10*3/uL (ref 0.7–4.0)
MCH: 29.4 pg (ref 26.0–34.0)
MCV: 88.6 fL (ref 78.0–100.0)
Monocytes Absolute: 0.6 10*3/uL (ref 0.1–1.0)
Monocytes Relative: 9 % (ref 3–12)
Platelets: 256 10*3/uL (ref 150–400)
RBC: 5.79 MIL/uL (ref 4.22–5.81)

## 2012-01-22 LAB — TSH: TSH: 1.251 u[IU]/mL (ref 0.350–4.500)

## 2012-01-22 LAB — COMPREHENSIVE METABOLIC PANEL
ALT: 20 U/L (ref 0–53)
AST: 17 U/L (ref 0–37)
Albumin: 4.8 g/dL (ref 3.5–5.2)
Alkaline Phosphatase: 64 U/L (ref 39–117)
BUN: 15 mg/dL (ref 6–23)
Creat: 0.84 mg/dL (ref 0.50–1.35)
Potassium: 4.7 mEq/L (ref 3.5–5.3)

## 2012-01-22 LAB — LIPID PANEL
Cholesterol: 187 mg/dL (ref 0–200)
HDL: 45 mg/dL (ref >39)
Total CHOL/HDL Ratio: 4.2 Ratio
VLDL: 13 mg/dL (ref 0–40)

## 2012-01-22 LAB — MICROALBUMIN / CREATININE URINE RATIO: Microalb Creat Ratio: 6.3 mg/g (ref 0.0–30.0)

## 2012-01-22 LAB — THYROID PEROXIDASE ANTIBODY: Thyroperoxidase Ab SerPl-aCnc: <10 IU/mL (ref <35.0)

## 2012-04-26 ENCOUNTER — Ambulatory Visit: Payer: No Typology Code available for payment source | Admitting: "Endocrinology

## 2012-05-03 ENCOUNTER — Ambulatory Visit: Payer: No Typology Code available for payment source | Admitting: "Endocrinology

## 2012-09-15 ENCOUNTER — Ambulatory Visit (INDEPENDENT_AMBULATORY_CARE_PROVIDER_SITE_OTHER): Payer: BC Managed Care – PPO | Admitting: "Endocrinology

## 2012-09-15 ENCOUNTER — Encounter: Payer: Self-pay | Admitting: "Endocrinology

## 2012-09-15 VITALS — BP 133/79 | HR 93 | Wt 211.3 lb

## 2012-09-15 DIAGNOSIS — E669 Obesity, unspecified: Secondary | ICD-10-CM

## 2012-09-15 DIAGNOSIS — E049 Nontoxic goiter, unspecified: Secondary | ICD-10-CM

## 2012-09-15 DIAGNOSIS — G909 Disorder of the autonomic nervous system, unspecified: Secondary | ICD-10-CM

## 2012-09-15 DIAGNOSIS — Z23 Encounter for immunization: Secondary | ICD-10-CM

## 2012-09-15 DIAGNOSIS — I10 Essential (primary) hypertension: Secondary | ICD-10-CM

## 2012-09-15 DIAGNOSIS — E1049 Type 1 diabetes mellitus with other diabetic neurological complication: Secondary | ICD-10-CM

## 2012-09-15 DIAGNOSIS — E1169 Type 2 diabetes mellitus with other specified complication: Secondary | ICD-10-CM

## 2012-09-15 DIAGNOSIS — E7800 Pure hypercholesterolemia, unspecified: Secondary | ICD-10-CM

## 2012-09-15 DIAGNOSIS — E1065 Type 1 diabetes mellitus with hyperglycemia: Secondary | ICD-10-CM

## 2012-09-15 DIAGNOSIS — E11649 Type 2 diabetes mellitus with hypoglycemia without coma: Secondary | ICD-10-CM

## 2012-09-15 DIAGNOSIS — R000 Tachycardia, unspecified: Secondary | ICD-10-CM

## 2012-09-15 DIAGNOSIS — E1043 Type 1 diabetes mellitus with diabetic autonomic (poly)neuropathy: Secondary | ICD-10-CM

## 2012-09-15 DIAGNOSIS — IMO0002 Reserved for concepts with insufficient information to code with codable children: Secondary | ICD-10-CM

## 2012-09-15 NOTE — Patient Instructions (Addendum)
Follow up visit in 3 months. Please have lab tests done about 2 weeks prior to the next visit.

## 2012-09-15 NOTE — Progress Notes (Signed)
Subjective:  Patient Name: [name] Date of Birth: [birth year]  MRN: [id number]  [name]  presents to the office today for follow-up of his type 1 diabetes mellitus, goiter, obesity, hypercholesterolemia, ADHD, fatigue, autonomic neuropathy, tachycardia, hypertension, microalbuminuria, and hypoglycemia.  HISTORY OF PRESENT ILLNESS:   [name] is a 31 y.o. Caucasian young man. [name] was unaccompanied.  1. The patient was first referred to me on 01/08/2006 by his family physician, Dr. Roanna Epley, for evaluation and management of type 1 diabetes mellitus and related problems. Patient was 31 years old.  A. The patient had been diagnosed with type 1 diabetes somewhere in 2001-2002, at the age of 66-19. He was initially diagnosed with type 2 diabetes mellitus, but was later re-classified as type 1 diabetes mellitus. He had been followed by Dr. Arther Dames, staff endocrinologist at [facility]. Patient was started on an insulin pump approximately 18 months prior. His pump was a Medtronic Paradigm 712. His blood glucose control was fair-poor. He was frequently not checking blood sugars as often as he needed to or taking insulin boluses as much as he needed to. He frequently noted fast heart rate. The patient's past medical history was positive for hypertension, for which he was taking lisinopril, and for what his mother called "extreme ADHD". He had previously been taking ADHD medicines but had discontinued them. He had prior ankle injuries and knee injuries, to included tears of the lateral menisci bilaterally. He had not had any surgeries. He was working for a [employer] that had many different jobs sites in different parts of the [place] and in other countries as well. As a result, the patient was on the road a lot. He did not use tobacco or drugs, but did drink alcohol occasionally.  B. On physical examination, his weight was 234 pounds, his height was 70 inches, his BMI was 33.2. Blood pressure  was 128/70. Hemoglobin A1c was 9.5%. Heart rate was 96. He had normal affect and fair insight. He had a 25-30 g goiter. He had normal 1+ DP pulses in his feet. He had normal sensation in his feet to touch, vibration, and monofilament. His CMP was normal except for glucose of 251. His cholesterol was 257, triglycerides 143, HDL 49, and LDL 179. TSH was 1.017, free T4 was 1.06, and free T3 was 3.3. His urinary microalbumin: creatinine ratio was 20.1 (normal less than 30).   C. The patient clearly needed better blood glucose control, which would only occur if he had better adherence to his plan. He appeared to have autonomic neuropathy, manifested by tachycardia.  His thyroid goiter suggested that he might have evolving Hashimoto's disease. He stated that he had lost some weight recently. I encouraged him to check his blood sugars more frequently and to take both correction boluses and food boluses. 2. During the past six years, the patient's blood glucose control has occasionally been better, but frequently been worse. His hemoglobin A1c values have varied from 8.3-11.2%. In the last year, however, his A1c's have varied from 9.6-10.1%. Throughout the 6 year period, the patient's autonomic neuropathy and tachycardia have remained essentially the same. On 12/03/10 his total cholesterol was 270, triglycerides 94, HDL 50, and LDL 201. I started him on Crestor, 10 mg per day. However at the time of his visit on 03/03/11, the total cholesterol was 244, triglycerides 102, HDL 40, and LDL 184. Unfortunately, he had been without Crestor for 10 days prior to those lab tests.  3. At the patient's last  PSS[facility] visit on 01/21/12, he was still having left shoulder problems, but the shoulder is much better now. He has been healthy overall. He is using Novolog aspart insulin in his insulin pump. He is taking lisinopril, 5 mg/day and Crestor, 10 mg/day every day. His fatigue has improved. His diet has not been well controlled for several  months, but he has been doing better more recently.  4. Pertinent Review of Systems: Constitutional: The patient feels "okay".  Eyes: Vision is good. There are no significant eye complaints. Last exam was about a year ago. He is scheduled for a FU exam soon.   Neck: The patient has no complaints of anterior neck swelling, soreness, tenderness,  pressure, discomfort, or difficulty swallowing.  Heart: Heart rate increases with exercise or other physical activity. The patient has no complaints of palpitations, irregular heat beats, chest pain, or chest pressure. Gastrointestinal: He occasionally has heartburn after eating a lot of onions. Bowel movents seem normal. The patient has no other complaints of excessive hunger, acid reflux, upset stomach, stomach aches or pains, diarrhea, or constipation. Legs: Muscle mass and strength seem normal. There are no complaints of numbness, tingling, burning, or pain. No edema is noted. Feet: There are no obvious foot problems. There are no complaints of numbness, tingling, burning, or pain. No edema is noted. GU: No problems with libido or performance. Hypoglycemia: occurs 1-2 times per week, especially if he does strenuous work or delays a meal too long.    5. BG printout: Checks BGs 0-7 times/day, but sometimes goes without checking BGs for more than 48 hours. Sometimes boluses 3-7 times/day, but sometimes only 1-2 times/day. Sometimes goes 4 days between site changes. Has not been following the Hyperglycemia Protocol in response to higher BGs. He has a lot of BG values > 400, usually associated with not responding to bad sites soon enough.    PAST MEDICAL, FAMILY, AND SOCIAL HISTORY:  Past Medical History  Diagnosis Date  . Type 1 diabetes mellitus not at goal   . Goiter   . Obesity   . Hypercholesterolemia   . ADHD (attention deficit hyperactivity disorder)   . Fatigue   . Tachycardia   . Autonomic neuropathy due to diabetes   . Hypertension   .  Uncontrolled DM with microalbuminuria or microproteinuria   . Hypoglycemia associated with diabetes     Family History  Problem Relation Age of Onset  . Diabetes Maternal Grandmother     T2 DM  . Cancer Maternal Grandfather   . Thyroid disease Neg Hx     Current outpatient prescriptions:glucose blood (ONE TOUCH ULTRA TEST) test strip, Use as instructed, Disp: 200 each, Rfl: 6;  insulin aspart (NOVOLOG) 100 UNIT/ML injection, Inject 50 Units into the skin 3 (three) times daily before meals., Disp: 50 mL, Rfl: 3;  insulin aspart (NOVOLOG) 100 UNIT/ML injection, Inject 100 Units into the skin 3 (three) times daily before meals., Disp: 50 mL, Rfl: 3 lisinopril (PRINIVIL,ZESTRIL) 5 MG tablet, Take 1 tablet (5 mg total) by mouth daily., Disp: 90 tablet, Rfl: 3;  rosuvastatin (CRESTOR) 10 MG tablet, Take 1 tablet (10 mg total) by mouth daily., Disp: 90 tablet, Rfl: 3  Allergies as of 09/15/2012  . (No Known Allergies)    1. Work and Family: He in on one job now that takes him frequently to [place], [place] during the week. He is only home on weekends. 2. Activities: He has been very physically active at work. He also plays  a lot of racquetball.  3. Smoking, alcohol, or drugs: Occasional beer. No tobacco or drugs. 4. Primary Care Provider: None  ROS: There are no other significant problems involving [name]'s other body systems.   Objective:  Vital Signs:  BP 133/79  Pulse 93  Wt 211 lb 4.8 oz (95.845 kg)   Ht Readings from Last 3 Encounters:  No data found for Ht   Wt Readings from Last 3 Encounters:  09/15/12 211 lb 4.8 oz (95.845 kg)  01/21/12 241 lb 3.2 oz (109.408 kg)  09/04/11 225 lb (102.059 kg)   PHYSICAL EXAM:  Constitutional: The patient appears obese, but otherwise healthy. He has lost 30 pounds and one pants' size since last visit. He is alert and very bright. Face: The face appears normal.  Eyes: There is no obvious arcus or proptosis. Moisture appears  normal. Mouth: The oropharynx and tongue appear normal. Oral moisture is normal. Neck: The neck appears to be visibly normal. No carotid bruits are noted. The thyroid gland is 20-25 grams in size. The consistency of the thyroid gland is softer today. The thyroid gland is not tender to palpation. Lungs: The lungs are clear to auscultation. Air movement is good. There were no rhonchi or wheezes. Heart: Heart rate and rhythm are regular. Heart sounds S1 and S2 are normal. I did not appreciate any pathologic cardiac murmurs. Abdomen: The abdomen is enlarged. Bowel sounds are normal. There is no obvious hepatomegaly, splenomegaly, or other mass effect.  Arms: Muscle size and bulk are normal for age.  Hands: There is a trace tremor. Phalangeal and metacarpophalangeal joints are normal. Palmar muscles are normal. Palmar skin is normal. Palmar moisture is also normal. Legs: Muscles appear normal for age. No edema is present. Feet: Feet are normally formed. Dorsalis pedal pulses are normal 2+ bilaterally. Neurologic: Strength is normal for age in both the upper and lower extremities. Muscle tone is normal. Sensation to touch is normal in both the legs and feet.    LAB DATA: Hemoglobin A1c is 9.1%, compared with 10.1% at last visit and with 9.6% at the prior visit. Labs: 01/21/12: TSH 1.251, free T4 1.10, free T3 3.6, CBC normal, CMP normal except for glucose 206, urinary microalbumin/creatinine ratio 6.3, cholesterol 187, triglycerides 67, HDL 45, LDL 129   Assessment and Plan:   ASSESSMENT:  1. T1DM: His BG control overall is better, but he is still missing many opportunities to check BG and to take enough insulin. On the days in which he does check BG frequently and does take both correction boluses and food boluses, his BGs are near- normal. 2. Hypoglycemia: Occasional, not severe. 3. Hypertension: Needs to take meds regularly and to exercise regularly.  4. Hyperlipidemia:His LDL was higher in March  than it should have been, but he had been off Crestor for weeks at that time. This will improve as he improves his diet, exercises, controls his BGs, and takes his medications.  5. Autonomic neuropathy and tachycardia: These problems have improved since the last visit. They re completely reversible if he get BGs under control. 6. Obesity: Weight has decreased significantly in a healthy fashion. Keep up the good work. Needs to continue to eat right and to exercise right. 7. Goiter: He was euthyroid in March. His goiter is smaller and softer in consistency today, c/w a resolution of previous thyroiditis. .  8. Fatigue: This problem has improved.   PLAN:  1. Diagnostic: Surveillance labs just prior next visit. . 2. Therapeutic: Follow  DM care plan. 3. Patient education: Discussed slow progression of long-term complications, especially when HTN and hyperlipidemia are co-morbidities. 4. Follow-up: 3 months  Level of Service: This visit lasted in excess of 60 minutes. More than 50% of the visit was devoted to counseling.  David Stall, MD 09/15/2012 10:31 AM

## 2012-10-21 ENCOUNTER — Other Ambulatory Visit: Payer: Self-pay | Admitting: "Endocrinology

## 2012-10-21 ENCOUNTER — Other Ambulatory Visit: Payer: Self-pay | Admitting: *Deleted

## 2012-10-21 DIAGNOSIS — IMO0002 Reserved for concepts with insufficient information to code with codable children: Secondary | ICD-10-CM

## 2012-10-21 DIAGNOSIS — E1065 Type 1 diabetes mellitus with hyperglycemia: Secondary | ICD-10-CM

## 2012-10-21 MED ORDER — INSULIN ASPART 100 UNIT/ML ~~LOC~~ SOLN: SUBCUTANEOUS | Status: DC

## 2012-11-01 ENCOUNTER — Other Ambulatory Visit: Payer: Self-pay | Admitting: *Deleted

## 2012-11-01 DIAGNOSIS — IMO0002 Reserved for concepts with insufficient information to code with codable children: Secondary | ICD-10-CM

## 2012-11-01 DIAGNOSIS — E1065 Type 1 diabetes mellitus with hyperglycemia: Secondary | ICD-10-CM

## 2012-11-01 MED ORDER — GLUCOSE BLOOD VI STRP: ORAL_STRIP | Status: DC

## 2012-11-08 ENCOUNTER — Other Ambulatory Visit: Payer: Self-pay | Admitting: *Deleted

## 2012-11-08 DIAGNOSIS — E1065 Type 1 diabetes mellitus with hyperglycemia: Secondary | ICD-10-CM

## 2012-11-08 DIAGNOSIS — E782 Mixed hyperlipidemia: Secondary | ICD-10-CM

## 2012-11-08 DIAGNOSIS — IMO0002 Reserved for concepts with insufficient information to code with codable children: Secondary | ICD-10-CM

## 2012-11-08 MED ORDER — ROSUVASTATIN CALCIUM 10 MG PO TABS: 10.0000 mg | ORAL_TABLET | Freq: Every day | ORAL | Status: DC

## 2012-11-09 ENCOUNTER — Other Ambulatory Visit: Payer: Self-pay | Admitting: *Deleted

## 2012-11-09 DIAGNOSIS — IMO0002 Reserved for concepts with insufficient information to code with codable children: Secondary | ICD-10-CM

## 2012-11-09 DIAGNOSIS — E782 Mixed hyperlipidemia: Secondary | ICD-10-CM

## 2012-11-09 DIAGNOSIS — E1065 Type 1 diabetes mellitus with hyperglycemia: Secondary | ICD-10-CM

## 2012-11-09 MED ORDER — ROSUVASTATIN CALCIUM 10 MG PO TABS: 10.0000 mg | ORAL_TABLET | Freq: Every day | ORAL | Status: DC

## 2012-11-11 ENCOUNTER — Other Ambulatory Visit: Payer: Self-pay | Admitting: *Deleted

## 2012-11-11 DIAGNOSIS — E7800 Pure hypercholesterolemia, unspecified: Secondary | ICD-10-CM

## 2012-11-11 DIAGNOSIS — E1065 Type 1 diabetes mellitus with hyperglycemia: Secondary | ICD-10-CM

## 2012-11-11 DIAGNOSIS — IMO0002 Reserved for concepts with insufficient information to code with codable children: Secondary | ICD-10-CM

## 2012-11-11 DIAGNOSIS — E782 Mixed hyperlipidemia: Secondary | ICD-10-CM

## 2012-11-11 MED ORDER — ATORVASTATIN CALCIUM 20 MG PO TABS: 20.0000 mg | ORAL_TABLET | Freq: Every day | ORAL | Status: DC

## 2012-12-06 ENCOUNTER — Other Ambulatory Visit: Payer: Self-pay | Admitting: *Deleted

## 2012-12-06 DIAGNOSIS — E1065 Type 1 diabetes mellitus with hyperglycemia: Secondary | ICD-10-CM

## 2012-12-06 DIAGNOSIS — IMO0002 Reserved for concepts with insufficient information to code with codable children: Secondary | ICD-10-CM

## 2012-12-13 ENCOUNTER — Other Ambulatory Visit: Payer: Self-pay | Admitting: *Deleted

## 2012-12-13 DIAGNOSIS — IMO0002 Reserved for concepts with insufficient information to code with codable children: Secondary | ICD-10-CM

## 2012-12-13 DIAGNOSIS — E1065 Type 1 diabetes mellitus with hyperglycemia: Secondary | ICD-10-CM

## 2012-12-13 MED ORDER — INSULIN ASPART 100 UNIT/ML ~~LOC~~ SOLN: SUBCUTANEOUS | Status: DC

## 2013-01-06 ENCOUNTER — Ambulatory Visit: Payer: BC Managed Care – PPO | Admitting: "Endocrinology

## 2013-03-25 ENCOUNTER — Other Ambulatory Visit: Payer: Self-pay | Admitting: *Deleted

## 2013-03-25 DIAGNOSIS — E1065 Type 1 diabetes mellitus with hyperglycemia: Secondary | ICD-10-CM

## 2013-03-25 DIAGNOSIS — IMO0002 Reserved for concepts with insufficient information to code with codable children: Secondary | ICD-10-CM

## 2013-03-25 MED ORDER — INSULIN ASPART 100 UNIT/ML FLEXPEN: PEN_INJECTOR | SUBCUTANEOUS | Status: DC

## 2013-03-29 ENCOUNTER — Telehealth: Payer: Self-pay | Admitting: "Endocrinology

## 2013-03-29 NOTE — Telephone Encounter (Signed)
Patient called to ask about what immunizations he will need for a trip to [place]. He will go on a mission trip to [place] in 2 weeks. He had already decided to go to a travel clinic to see what immunizations he will need. He asked if antimalarials will affect his DM meds? I told him that the antimalarials will not adversely affect his DM medications. I suggested that he take the recommendations from the travel clinic. David Stall

## 2013-04-11 ENCOUNTER — Other Ambulatory Visit: Payer: Self-pay | Admitting: *Deleted

## 2013-04-11 DIAGNOSIS — E1065 Type 1 diabetes mellitus with hyperglycemia: Secondary | ICD-10-CM

## 2013-04-11 DIAGNOSIS — IMO0002 Reserved for concepts with insufficient information to code with codable children: Secondary | ICD-10-CM

## 2013-04-23 LAB — COMPREHENSIVE METABOLIC PANEL
ALT: 17 U/L (ref 0–53)
CO2: 28 mEq/L (ref 19–32)
Calcium: 9.2 mg/dL (ref 8.4–10.5)
Chloride: 104 mEq/L (ref 96–112)
Creat: 0.88 mg/dL (ref 0.50–1.35)

## 2013-04-23 LAB — LIPID PANEL
Cholesterol: 216 mg/dL — ABNORMAL HIGH (ref 0–200)
Total CHOL/HDL Ratio: 5.5 Ratio

## 2013-04-23 LAB — T4, FREE: Free T4: 1.17 ng/dL (ref 0.80–1.80)

## 2013-04-23 LAB — TSH: TSH: 0.505 u[IU]/mL (ref 0.350–4.500)

## 2013-04-24 LAB — MICROALBUMIN / CREATININE URINE RATIO
Creatinine, Urine: 418 mg/dL
Microalb Creat Ratio: 6.9 mg/g (ref 0.0–30.0)

## 2013-04-28 ENCOUNTER — Encounter: Payer: Self-pay | Admitting: "Endocrinology

## 2013-04-28 ENCOUNTER — Ambulatory Visit (INDEPENDENT_AMBULATORY_CARE_PROVIDER_SITE_OTHER): Payer: BC Managed Care – PPO | Admitting: "Endocrinology

## 2013-04-28 VITALS — BP 109/75 | HR 92 | Wt 221.0 lb

## 2013-04-28 DIAGNOSIS — E1049 Type 1 diabetes mellitus with other diabetic neurological complication: Secondary | ICD-10-CM

## 2013-04-28 DIAGNOSIS — E11649 Type 2 diabetes mellitus with hypoglycemia without coma: Secondary | ICD-10-CM

## 2013-04-28 DIAGNOSIS — E049 Nontoxic goiter, unspecified: Secondary | ICD-10-CM

## 2013-04-28 DIAGNOSIS — G909 Disorder of the autonomic nervous system, unspecified: Secondary | ICD-10-CM

## 2013-04-28 DIAGNOSIS — R000 Tachycardia, unspecified: Secondary | ICD-10-CM

## 2013-04-28 DIAGNOSIS — E669 Obesity, unspecified: Secondary | ICD-10-CM

## 2013-04-28 DIAGNOSIS — E1065 Type 1 diabetes mellitus with hyperglycemia: Secondary | ICD-10-CM

## 2013-04-28 DIAGNOSIS — E7800 Pure hypercholesterolemia, unspecified: Secondary | ICD-10-CM

## 2013-04-28 DIAGNOSIS — E1169 Type 2 diabetes mellitus with other specified complication: Secondary | ICD-10-CM

## 2013-04-28 DIAGNOSIS — IMO0002 Reserved for concepts with insufficient information to code with codable children: Secondary | ICD-10-CM

## 2013-04-28 DIAGNOSIS — E1043 Type 1 diabetes mellitus with diabetic autonomic (poly)neuropathy: Secondary | ICD-10-CM

## 2013-04-28 DIAGNOSIS — I10 Essential (primary) hypertension: Secondary | ICD-10-CM

## 2013-04-28 DIAGNOSIS — B353 Tinea pedis: Secondary | ICD-10-CM

## 2013-04-28 MED ORDER — ATORVASTATIN CALCIUM 20 MG PO TABS: 20.0000 mg | ORAL_TABLET | Freq: Every day | ORAL | Status: DC

## 2013-04-28 MED ORDER — INSULIN ASPART 100 UNIT/ML FLEXPEN: PEN_INJECTOR | SUBCUTANEOUS | Status: DC

## 2013-04-28 MED ORDER — LISINOPRIL 5 MG PO TABS: 5.0000 mg | ORAL_TABLET | Freq: Every day | ORAL | Status: DC

## 2013-04-28 MED ORDER — KETOCONAZOLE 2 % EX CREA: TOPICAL_CREAM | Freq: Every day | CUTANEOUS | Status: DC

## 2013-04-28 MED ORDER — INSULIN ASPART 100 UNIT/ML ~~LOC~~ SOLN: SUBCUTANEOUS | Status: DC

## 2013-04-28 NOTE — Patient Instructions (Signed)
Follow up visit in 3 months. 

## 2013-04-28 NOTE — Progress Notes (Signed)
Subjective:  Patient Name: [name] Date of Birth: 1981-08-13  MRN: [id number]  [name]  presents to the office today for follow-up of his type 1 diabetes mellitus, goiter, obesity, hypercholesterolemia, ADHD, fatigue, autonomic neuropathy, tachycardia, hypertension, microalbuminuria, and hypoglycemia.  HISTORY OF PRESENT ILLNESS:   [name] is a 32 y.o. Caucasian young man. [name] was unaccompanied.  1. The patient was first referred to me on 01/08/2006 by his family physician, Dr. Roanna Epley, for evaluation and management of type 1 diabetes mellitus and related problems. Patient was 32 years old.  A. The patient had been diagnosed with type 1 diabetes somewhere in 2001-2002, at the age of 89-19. He was initially diagnosed with type 2 diabetes mellitus, but was later re-classified as type 1 diabetes mellitus. He had been followed by Dr. Arther Dames, staff endocrinologist at [facility]. Patient was started on an insulin pump approximately 18 months prior. His pump was a Medtronic Paradigm 712. His blood glucose control was fair-poor. He was frequently not checking blood sugars as often as he needed to or taking insulin boluses as much as he needed to. He frequently noted fast heart rate. The patient's past medical history was positive for hypertension, for which he was taking lisinopril, and for what his mother called "extreme ADHD". He had previously been taking ADHD medicines but had discontinued them. He had prior ankle injuries and knee injuries, to included tears of the lateral menisci bilaterally. He had not had any surgeries. He was working for a [employer] that had many different jobs sites in different parts of the [place] and in other countries as well. As a result, the patient was on the road a lot. He did not use tobacco or drugs, but did drink alcohol occasionally.  B. On physical examination, his weight was 234 pounds, his height was 70 inches, his BMI was 33.2. Blood pressure  was 128/70. Hemoglobin A1c was 9.5%. Heart rate was 96. He had normal affect and fair insight. He had a 25-30 g goiter. He had normal 1+ DP pulses in his feet. He had normal sensation in his feet to touch, vibration, and monofilament. His CMP was normal except for glucose of 251. His cholesterol was 257, triglycerides 143, HDL 49, and LDL 179. TSH was 1.017, free T4 was 1.06, and free T3 was 3.3. His urinary microalbumin: creatinine ratio was 20.1 (normal less than 30).   C. The patient clearly needed better blood glucose control, which would only occur if he had better adherence to his plan. He appeared to have autonomic neuropathy, manifested by tachycardia.  His thyroid goiter suggested that he might have evolving Hashimoto's disease. He stated that he had lost some weight recently. I encouraged him to check his blood sugars more frequently and to take both correction boluses and food boluses.  2. During the past seven years, the patient's blood glucose control has occasionally been better, but frequently been worse. His hemoglobin A1c values have varied from 8.3-11.2%. In the last year, however, his A1c's have varied from 9.6-10.1%. Throughout the 6 year period, the patient's autonomic neuropathy and tachycardia have remained essentially the same. On 12/03/10 his total cholesterol was 270, triglycerides 94, HDL 50, and LDL 201. I started him on Crestor, 10 mg per day. However at the time of his visit on 03/03/11, the total cholesterol was 244, triglycerides 102, HDL 40, and LDL 184. Unfortunately, he had been without Crestor for 10 days prior to those lab tests.   3. At the  patient's last PSS[facility] visit on 09/15/12, he was still having left shoulder problems, but the shoulder was improving. He has some limitation to range of motion, but no pain now. He has been healthy overall. He is using Novolog aspart insulin in his insulin pump. He ran out of lisinopril, 5 mg/day and atorvastatin, 20 mg/day when he was working  in [place]. His fatigue has resolved. His diet and exercise programs have improved in the past 2 months.  4. Pertinent Review of Systems: Constitutional: The patient feels "a little better".  Eyes: Vision is good. There are no significant eye complaints. Last exam was about 6 weeks ago. There were no signs of DM disease.  ago.  Neck: The patient had some anterior superior central  neck soreness and tenderness yesterday and on other occasions in the past. The soreness is localized to the area of the submental glands. He has had no complaints of anterior neck swelling, pressure, discomfort, or difficulty swallowing.  Heart: Heart rate increases with exercise or other physical activity. The patient has no complaints of palpitations, irregular heat beats, chest pain, or chest pressure. Gastrointestinal: He has become progressively lactose intolerant over time. Bowel movents seem normal. The patient has no other complaints of excessive hunger, heartburn, acid reflux, upset stomach, stomach aches or pains, diarrhea, or constipation. Legs: Muscle mass and strength seem normal. There are no complaints of numbness, tingling, burning, or pain. No edema is noted. Feet: There are no obvious foot problems. There are no complaints of numbness, tingling, burning, or pain. No edema is noted. GU: No problems with libido or performance. Hypoglycemia: "Quite a few", perhaps 2-3 episodes per week in the past month when he has been paying better attention to his diet and exercising more.    5. BG printout: The printout documens fewer than 2 BG checks per day, sometimes none for 48-72 hours. He says that he checks more frequently than that, but often does not input the BGs into the pump if they are only a small amount above his BG target zone because he doesn't want the extra insulin that might cause him to be hypoglycemic during work. Most of his low BGs occur in the late afternoons. Sometimes the low BGs occur when he has  not checked his BG earlier in the day, but some occur when he has checked BGs earlier. Some days he boluses 5 times, but he may also go for 48 hours without bolusing, He changes sites every 4-6 days. He has not been following the Hyperglycemia Protocol in response to higher BGs. He has a lot of BG values > 400, usually associated with not responding to bad sites soon enough.    PAST MEDICAL, FAMILY, AND SOCIAL HISTORY:  Past Medical History  Diagnosis Date  . Type 1 diabetes mellitus not at goal   . Goiter   . Obesity   . Hypercholesterolemia   . ADHD (attention deficit hyperactivity disorder)   . Fatigue   . Tachycardia   . Autonomic neuropathy due to diabetes   . Hypertension   . Uncontrolled DM with microalbuminuria or microproteinuria   . Hypoglycemia associated with diabetes     Family History  Problem Relation Age of Onset  . Diabetes Maternal Grandmother     T2 DM  . Cancer Maternal Grandfather   . Thyroid disease Neg Hx     Current outpatient prescriptions:atorvastatin (LIPITOR) 20 MG tablet, Take 1 tablet (20 mg total) by mouth daily., Disp: 30 tablet, Rfl: 6;  glucose blood (ONE TOUCH ULTRA TEST) test strip, Use as instructed, Disp: 200 each, Rfl: 6;  insulin aspart (NOVOLOG FLEXPEN) 100 unit/mL SOLN FlexPen, Use if pump fails up to 50 units daily, Disp: 5 pen, Rfl: 6 insulin aspart (NOVOLOG) 100 UNIT/ML injection, Use with insulin pump every 48 hours, please dispense 5 vials, Disp: 5 vial, Rfl: 6;  lisinopril (PRINIVIL,ZESTRIL) 5 MG tablet, Take 1 tablet (5 mg total) by mouth daily., Disp: 90 tablet, Rfl: 3  Allergies as of 04/28/2013  . (No Known Allergies)    1. Work and Family: He is now working in [employer], [place]. He will probably start a new job in [place] in about 3 months. He should be local then for about one year. 2. Activities: He has been very physically active at work. He also swims and works out on the [employer] and treadmill. He also lifts free  weights.  3. Smoking, alcohol, or drugs: Occasional beer. No tobacco or drugs. 4. Primary Care Provider: None  REVIEW OF SYSTEMS: There are no other significant problems involving Pasqualino's other body systems.   Objective:  Vital Signs:  BP 109/75  Pulse 92  Wt 221 lb (100.245 kg)   Ht Readings from Last 3 Encounters:  No data found for Ht   Wt Readings from Last 3 Encounters:  04/28/13 221 lb (100.245 kg)  09/15/12 211 lb 4.8 oz (95.845 kg)  01/21/12 241 lb 3.2 oz (109.408 kg)   PHYSICAL EXAM:  Constitutional: The patient appears obese, but otherwise healthy. He has re-gained 10 pounds of the 30 pounds he had previously lost. He is alert and bright, but also looks tired. Face: The face appears normal.  Eyes: There is no obvious arcus or proptosis. Moisture appears normal. Mouth: The oropharynx and tongue appear normal. Oral moisture is normal. There is no evidence for a mandibular infection.  Neck: The neck appears to be visibly normal. No carotid bruits are noted. The thyroid gland is 20-25 grams in size. The consistency of the thyroid gland is soft today. The thyroid gland is not tender to palpation. The submental glands are somewhat tender to palpation.  Lungs: The lungs are clear to auscultation. Air movement is good. There were no rhonchi or wheezes. Heart: Heart rate and rhythm are regular. Heart sounds S1 and S2 are normal. I did not appreciate any pathologic cardiac murmurs. Abdomen: The abdomen is enlarged. Bowel sounds are normal. There is no obvious hepatomegaly, splenomegaly, or other mass effect.  Arms: Muscle size and bulk are normal for age.  Hands: There is a trace tremor. Phalangeal and metacarpophalangeal joints are normal. Palmar muscles are normal. Palmar skin is normal. Palmar moisture is also normal. Legs: Muscles appear normal for age. No edema is present. Feet: Feet are normally formed. Dorsalis pedal pulses are normal 1+ bilaterally. His tinea pedis in the  heels has increased to 2-3+. He also has tinea pedis of the calluses of the balls of his feet.  Neurologic: Strength is normal for age in both the upper and lower extremities. Muscle tone is normal. Sensation to touch is normal in both the legs and feet.    LAB DATA: Hemoglobin A1c is 9.4% today, compared with 9.1%, at last visit and with 10.1% at the prior visit. Labs 04/23/13: CMP normal, except glucose 144; cholesterol 216, triglycerides 97, HDL 39, LDL 158; urinary microalbumin/creatinine ratio 6.9; TSH 0.505, free T4 1.17, free T3 3.8 Labs: 01/21/12: TSH 1.251, free T4 1.10, free T3 3.6, CBC  normal, CMP normal except for glucose 206, urinary microalbumin/creatinine ratio 6.3, cholesterol 187, triglycerides 67, HDL 45, LDL 129   Assessment and Plan:   ASSESSMENT:  1. T1DM: His BG control overall is worse. If it were not for his more frequent hypoglycemia recently, his HbA1c would be even higher. He is still missing many opportunities to check BG and to take enough insulin. On the days in which he does check BG frequently and does take both correction boluses and food boluses, his BGs are usually better.  He often allows sites that are going bad to remain in place too long.  2. Hypoglycemia: Low BGs have been more frequent recently. He often does not check BGs frequently enough to be abe to predict lows. 3. Hypertension: BP is surprisingly good today. He needs to take meds regularly and to exercise regularly.  4. Hyperlipidemia:His total cholesterol and LDL were higherlLast month than in March 201, mostly due to running uout of medication and not getting refills. He absolutely needs to remain on his atorvastatin in order to prevent development of ASCVD.  5. Autonomic neuropathy and tachycardia: These problems improved when his BGs were better at last vitis. Unfortunately, these problems will worsen unless he gets his BGs back down. They are completely reversible if he get BGs under control. 6.  Obesity: Weight has increased again. Needs to continue to eat right and to exercise right. 7. Goiter: He was euthyroid in March 2013, but is borderline hyperthyroid now. I suspect that he has had a recent flare up of Hashitoxicosis, but he  could have Graves' disease. We need to re-check his TFTs,and TSI in two months. . 8. Fatigue: This problem has improved.   PLAN:  1. Diagnostic: TFTs and TSI in two months.  . 2. Therapeutic: Follow DM care plan. 3. Patient education: Discussed slow progression of long-term complications, especially when HTN and hyperlipidemia are co-morbidities. 4. Follow-up: 3 months  Level of Service: This visit lasted in excess of 60 minutes. More than 50% of the visit was devoted to counseling.  David Stall, MD 04/28/2013 9:03 AM

## 2013-08-02 ENCOUNTER — Ambulatory Visit: Payer: BC Managed Care – PPO | Admitting: "Endocrinology

## 2013-08-05 ENCOUNTER — Other Ambulatory Visit: Payer: Self-pay | Admitting: *Deleted

## 2013-08-05 DIAGNOSIS — IMO0002 Reserved for concepts with insufficient information to code with codable children: Secondary | ICD-10-CM

## 2013-08-05 DIAGNOSIS — E1065 Type 1 diabetes mellitus with hyperglycemia: Secondary | ICD-10-CM

## 2013-08-05 MED ORDER — GLUCOSE BLOOD VI STRP: ORAL_STRIP | Status: DC

## 2013-09-21 ENCOUNTER — Other Ambulatory Visit: Payer: Self-pay | Admitting: *Deleted

## 2013-09-21 DIAGNOSIS — E038 Other specified hypothyroidism: Secondary | ICD-10-CM

## 2013-10-05 ENCOUNTER — Encounter (INDEPENDENT_AMBULATORY_CARE_PROVIDER_SITE_OTHER): Payer: Self-pay

## 2013-10-05 ENCOUNTER — Ambulatory Visit (INDEPENDENT_AMBULATORY_CARE_PROVIDER_SITE_OTHER): Payer: Commercial Indemnity | Admitting: "Endocrinology

## 2013-10-05 ENCOUNTER — Encounter: Payer: Self-pay | Admitting: "Endocrinology

## 2013-10-05 VITALS — BP 124/76 | HR 99 | Wt 239.0 lb

## 2013-10-05 DIAGNOSIS — E1169 Type 2 diabetes mellitus with other specified complication: Secondary | ICD-10-CM

## 2013-10-05 DIAGNOSIS — E1042 Type 1 diabetes mellitus with diabetic polyneuropathy: Secondary | ICD-10-CM

## 2013-10-05 DIAGNOSIS — Z23 Encounter for immunization: Secondary | ICD-10-CM

## 2013-10-05 DIAGNOSIS — E1043 Type 1 diabetes mellitus with diabetic autonomic (poly)neuropathy: Secondary | ICD-10-CM

## 2013-10-05 DIAGNOSIS — E1065 Type 1 diabetes mellitus with hyperglycemia: Secondary | ICD-10-CM

## 2013-10-05 DIAGNOSIS — E1049 Type 1 diabetes mellitus with other diabetic neurological complication: Secondary | ICD-10-CM

## 2013-10-05 DIAGNOSIS — E049 Nontoxic goiter, unspecified: Secondary | ICD-10-CM

## 2013-10-05 DIAGNOSIS — I498 Other specified cardiac arrhythmias: Secondary | ICD-10-CM

## 2013-10-05 DIAGNOSIS — IMO0002 Reserved for concepts with insufficient information to code with codable children: Secondary | ICD-10-CM

## 2013-10-05 DIAGNOSIS — I10 Essential (primary) hypertension: Secondary | ICD-10-CM

## 2013-10-05 DIAGNOSIS — R000 Tachycardia, unspecified: Secondary | ICD-10-CM

## 2013-10-05 DIAGNOSIS — E11649 Type 2 diabetes mellitus with hypoglycemia without coma: Secondary | ICD-10-CM

## 2013-10-05 DIAGNOSIS — G909 Disorder of the autonomic nervous system, unspecified: Secondary | ICD-10-CM

## 2013-10-05 DIAGNOSIS — E782 Mixed hyperlipidemia: Secondary | ICD-10-CM

## 2013-10-05 LAB — GLUCOSE, POCT (MANUAL RESULT ENTRY): POC Glucose: 336 mg/dl — AB (ref 70–99)

## 2013-10-05 LAB — POCT GLYCOSYLATED HEMOGLOBIN (HGB A1C): Hemoglobin A1C: 9.9

## 2013-10-05 NOTE — Patient Instructions (Signed)
Follow up visit in 3 months. 

## 2013-10-05 NOTE — Progress Notes (Signed)
Subjective:  Patient Name: [name] Date of Birth: 1981-01-07  MRN: [id number]  [name]  presents to the office today for follow-up of his type 1 diabetes mellitus, goiter, obesity, hypercholesterolemia, ADHD, fatigue, autonomic neuropathy, tachycardia, hypertension, microalbuminuria, and hypoglycemia.  HISTORY OF PRESENT ILLNESS:   [name] is a 32 y.o. Caucasian young man. [name] was unaccompanied.  1. The patient was first referred to me on 01/08/2006 by his family physician, Dr. Roanna Epley, for evaluation and management of type 1 diabetes mellitus and related problems. Patient was 32 years old.  A. The patient had been diagnosed with type 1 diabetes somewhere in 2001-2002, at the age of 82-19. He was initially diagnosed with type 2 diabetes mellitus, but was later re-classified as type 1 diabetes mellitus. He had been followed by Dr. Arther Dames, staff endocrinologist at [facility]. Patient was started on an insulin pump approximately 18 months prior. His pump was a Medtronic Paradigm 712. His blood glucose control was fair-to-poor. He was frequently not checking blood sugars or taking insulin boluses as often as he needed to. He frequently noted fast heart rate. The patient's past medical history was positive for hypertension, for which he was taking lisinopril, and for what his mother called "extreme ADHD". He had previously been taking ADHD medicines but had discontinued them. He had prior ankle injuries and knee injuries, to included tears of the lateral menisci bilaterally. He had not had any surgeries. He was working for a [employer] that had many different jobs sites in different parts of the [place] and in other countries as well. As a result, the patient was on the road a lot. He did not use tobacco or drugs, but did drink alcohol occasionally.  B. On physical examination, his weight was 234 pounds, his height was 70 inches, his BMI was 33.2. Blood pressure was 128/70. Hemoglobin A1c  was 9.5%. Heart rate was 96. He had normal affect and fair insight. He had a 25-30 gram goiter. He had normal 1+ DP pulses in his feet. He had normal sensation in his feet to touch, vibration, and monofilament. His CMP was normal except for glucose of 251. His cholesterol was 257, triglycerides 143, HDL 49, and LDL 179. TSH was 1.017, free T4 was 1.06, and free T3 was 3.3. His urinary microalbumin: creatinine ratio was 20.1 (normal less than 30).   C. The patient clearly needed better blood glucose control, which would only occur if he had better adherence to his plan. He appeared to have autonomic neuropathy, manifested by tachycardia.  His thyroid goiter suggested that he might have evolving Hashimoto's disease. He stated that he had lost some weight recently. I encouraged him to check his blood sugars more frequently and to take both correction boluses and food boluses.  2. During the past seven years, the patient's blood glucose control has occasionally been better, but frequently been worse. His hemoglobin A1c values have varied from 8.3-11.2%. In the last year, however, his A1c's have varied from 9.6-10.1%. Throughout the 67 year period, the patient's autonomic neuropathy and tachycardia have remained essentially the same. On 12/03/10 his total cholesterol was 270, triglycerides 94, HDL 50, and LDL 201. I started him on Crestor, 10 mg per day. However at the time of his visit on 03/03/11, the total cholesterol was 244, triglycerides 102, HDL 40, and LDL 184. Unfortunately, he had been without Crestor for 10 days prior to those lab tests.   3. At the patient's last [facility] visit on 04/28/13,  he was still having left shoulder problems, but the shoulder was improving. The shoulder problems still come and go. He also hurt his low back playing soccer, resulting in some residual LBP, but no sciatica. He has been healthy overall.He just received his flu shot today. He is using Novolog aspart insulin in his insulin pump.  He still takes lisinopril, 5 mg/day and atorvastatin, 20 mg/day pretty reliably. His fatigue has resolved. His diet is pretty good. He has not been exercising as much since he hurt his back.   4. Pertinent Review of Systems: Constitutional: The patient feels "good".  Eyes: Vision is good. There are no significant eye complaints. Last exam was in June. There were no signs of DM disease.   Neck: The patient had not had any anterior superior central  neck soreness and tenderness recently. He has had no complaints of anterior neck swelling, pressure, discomfort, or difficulty swallowing.  Heart: Heart rate increases with exercise or other physical activity. The patient has no complaints of palpitations, irregular heat beats, chest pain, or chest pressure. Gastrointestinal: He has become progressively lactose intolerant over time. Bowel movents seem normal. The patient has no other complaints of excessive hunger, heartburn, acid reflux, upset stomach, stomach aches or pains, diarrhea, or constipation. Legs: Muscle mass and strength seem normal. There are no complaints of numbness, tingling, burning, or pain. No edema is noted. Feet: There are no obvious foot problems. There are no complaints of numbness, tingling, burning, or pain. No edema is noted. GU: No problems with libido or performance. Hypoglycemia: He has 1-2 episodes per week, usually associated with delaying the dinner meal due to working late or occurring after working out.       5. BG printout:  He checks BGs anywhere from 0-5 times per day. He says that he checks more often than he inputs BG values, because if the BG value is good, he knows that value would not affect his insulin doses. He sometimes goes for 36 hours without having a BG value inputted to his pump. He boluses 2-6 times per day. Most boluses are food boluses that do not account for his BG values. Most of his high BGs occur in the late afternoons and evenings, usually when his  sites go bad or when it has been along time between BG checks and/or boluses. He changes sites every 4-6 days. He has not been following the Hyperglycemia Protocol in response to higher BGs. He has a lot of BG values > 400, usually associated with not responding to bad sites soon enough.    PAST MEDICAL, FAMILY, AND SOCIAL HISTORY:  Past Medical History  Diagnosis Date  . Type 1 diabetes mellitus not at goal   . Goiter   . Obesity   . Hypercholesterolemia   . ADHD (attention deficit hyperactivity disorder)   . Fatigue   . Tachycardia   . Autonomic neuropathy due to diabetes   . Hypertension   . Uncontrolled DM with microalbuminuria or microproteinuria   . Hypoglycemia associated with diabetes     Family History  Problem Relation Age of Onset  . Diabetes Maternal Grandmother     T2 DM  . Cancer Maternal Grandfather   . Thyroid disease Neg Hx     Current outpatient prescriptions:atorvastatin (LIPITOR) 20 MG tablet, Take 1 tablet (20 mg total) by mouth daily., Disp: 30 tablet, Rfl: 6;  glucose blood (ONE TOUCH ULTRA TEST) test strip, Check glucose 6x daily, Disp: 200 each, Rfl: 6;  insulin aspart (NOVOLOG FLEXPEN) 100 UNIT/ML SOPN FlexPen, Use if pump fails up to 50 units daily, Disp: 5 pen, Rfl: 6 insulin aspart (NOVOLOG) 100 UNIT/ML injection, Use with insulin pump every 48 hours, please dispense 5 vials, Disp: 5 vial, Rfl: 6;  ketoconazole (NIZORAL) 2 % cream, Apply topically daily., Disp: 15 g, Rfl: 0;  lisinopril (PRINIVIL,ZESTRIL) 5 MG tablet, Take 1 tablet (5 mg total) by mouth daily., Disp: 90 tablet, Rfl: 3  Allergies as of 10/05/2013  . (No Known Allergies)    1. Work and Family: He is now working in [place], [place]. He will probably be in this job for the next 7-8 months. It is more convenient now for him to check BGs and to take boluses during the work week.  2. Activities: He has been very physically active at work. He is not swimming much, but does work out on the elliptical  machine and treadmill. He also lifts free weights. He has also recently gotten into kayaking.  3. Smoking, alcohol, or drugs: Occasional beer. No tobacco or drugs. 4. Primary Care Provider: None  REVIEW OF SYSTEMS: There are no other significant problems involving [name]'s other body systems.   Objective:  Vital Signs:  BP 124/76  Pulse 99  Wt 239 lb (108.41 kg)   Ht Readings from Last 3 Encounters:  No data found for Ht   Wt Readings from Last 3 Encounters:  10/05/13 239 lb (108.41 kg)  04/28/13 221 lb (100.245 kg)  09/15/12 211 lb 4.8 oz (95.845 kg)   PHYSICAL EXAM:  Constitutional: The patient appears obese, but otherwise healthy. He has re-gained another 18 pounds of the 30 pounds he had previously lost. He is alert and bright, but also looks a bit tired. Face: The face appears normal.  Eyes: There is no obvious arcus or proptosis. Moisture appears normal. Mouth: The oropharynx and tongue appear normal. Oral moisture is normal. There is no evidence for a mandibular infection.  Neck: The neck appears to be visibly normal. No carotid bruits are noted. The thyroid gland is smaller at 20-23 grams in size. The consistency of the thyroid gland is soft today. The thyroid gland is not tender to palpation.  Lungs: The lungs are clear to auscultation. Air movement is good.  Heart: Heart rate and rhythm are regular. Heart sounds S1 and S2 are normal. I did not appreciate any pathologic cardiac murmurs. Abdomen: The abdomen is more enlarged. Bowel sounds are normal. There is no obvious hepatomegaly, splenomegaly, or other mass effect.  Arms: Muscle size and bulk are normal for age.  Hands: There is a 1+ tremor of his right hand and a trace tremor of his left hand.  Phalangeal and metacarpophalangeal joints are normal. Palmar muscles are normal. Palmar skin is normal. Palmar moisture is also normal. Legs: Muscles appear normal for age. No edema is present. Feet: Feet are normally formed.  Dorsalis pedal pulses are normal 1+ bilaterally. Tinea pedis of the calluses of the balls of his feet has almost totally resolved.  Neurologic: Strength is normal for age in both the upper and lower extremities. Muscle tone is normal. Sensation to touch is normal in both the legs and feet.    LAB DATA: Hemoglobin A1c is 9.9% today, compared with 9.4%, at last visit and with 9.1% at the prior visit. Labs 04/23/13: CMP normal, except glucose 144; cholesterol 216, triglycerides 97, HDL 39, LDL 158; urinary microalbumin/creatinine ratio 6.9; TSH 0.505, free T4 1.17, free T3 3.8 Labs:  01/21/12: TSH 1.251, free T4 1.10, free T3 3.6, CBC normal, CMP normal except for glucose 206, urinary microalbumin/creatinine ratio 6.3, cholesterol 187, triglycerides 67, HDL 45, LDL 129   Assessment and Plan:   ASSESSMENT:  1. T1DM: His BG control overall is worse again. He is still missing many opportunities to check BG and to take enough insulin. On the days in which he does check BG frequently and does take both correction boluses and food boluses, his BGs are usually much better. He often allows sites that are going bad to remain in place too long.  2. Hypoglycemia: Low BGs have been less frequent recently, but he did have one low BG of 42. None of his hypoglycemic episodes have ben "severe". He often does not check BGs frequently enough to be abe to predict lows. 3. Hypertension: BP is good today. He needs to take meds regularly and to exercise regularly.  4. Hypercholesterolemia: His lipids need to be re-checked. He will do so now. He absolutely needs to remain on his atorvastatin in order to prevent development of ASCVD.  5. Autonomic neuropathy and tachycardia: These problems improved when his BGs were better two visits ago. Unfortunately, these problems worsened when his BGs increased. They are completely reversible if he get BGs under control. 6. Obesity: Weight has increased again. Needs to continue to eat right  and to exercise right. 7. Goiter: He was euthyroid in March 2013, but was borderline hyperthyroid in June. I suspect that he had had a recent flare up of Hashitoxicosis, but he  could have Graves' disease. We need to re-check his TFTs, and TSI now. 8. Fatigue: This problem has improved.   PLAN:  1. Diagnostic:  Repeat lipid panel, TSI, and TFTs.  2. Therapeutic: Follow DM care plan. 3. Patient education: Discussed slow progression of long-term complications, especially when HTN and hyperlipidemia are co-morbidities. 4. Follow-up: 3 months  Level of Service: This visit lasted in excess of 60 minutes. More than 50% of the visit was devoted to counseling.  David Stall, MD 10/05/2013 1:39 PM

## 2013-11-02 ENCOUNTER — Encounter: Payer: Self-pay | Admitting: Sports Medicine

## 2013-11-02 ENCOUNTER — Ambulatory Visit (INDEPENDENT_AMBULATORY_CARE_PROVIDER_SITE_OTHER): Payer: Commercial Indemnity | Admitting: Sports Medicine

## 2013-11-02 VITALS — BP 108/75 | HR 88 | Ht 72.0 in | Wt 240.0 lb

## 2013-11-02 DIAGNOSIS — M546 Pain in thoracic spine: Secondary | ICD-10-CM | POA: Insufficient documentation

## 2013-11-02 MED ORDER — CYCLOBENZAPRINE HCL 10 MG PO TABS: 10.0000 mg | ORAL_TABLET | Freq: Three times a day (TID) | ORAL | Status: DC | PRN

## 2013-11-02 NOTE — Assessment & Plan Note (Signed)
I gave him a series of flexion exercises and some extension exercises to try at home Use some light weight for rotation and dips  Use Flexeril just at nighttime for the next 2 weeks and then periodically if he gets a flare  Avoid heavy lifting or rotation at this time  If not better in 6-8 weeks we may want to reevaluate

## 2013-11-02 NOTE — Progress Notes (Signed)
Patient ID: Beatrix ShipperGabriel J Keator, male   DOB: 05/17/1981, 33 y.o.   MRN: [id number]  1 yr ago front flip on trampoline LBP after tuck Bad x 1 week and grad went away  Now past 2 mos more back pain again Radiates up to shoulder blades and not down to legs No cough pain  Works in [employer] When he lifts or rotate his back he feels pain No radicular symptoms   Physical exam Moderately obese but strong slight male in no acute distress BP 108/75  Pulse 88  Ht 6' (1.829 m)  Wt 240 lb (108.863 kg)  BMI 32.54 kg/m2  Full flexion Mild pain on back extension Pain to the T8-10 level with rotation of back Lateral bending only mildly uncomfortable  Palpation of posterior processes reveals some pain at T8-10 level Mild muscle spasm in that region  Other lumbar muscles unremarkable  Neuro testing unremarkable

## 2014-01-03 ENCOUNTER — Ambulatory Visit: Payer: Commercial Indemnity | Admitting: "Endocrinology

## 2014-02-07 ENCOUNTER — Other Ambulatory Visit: Payer: Self-pay | Admitting: *Deleted

## 2014-02-07 DIAGNOSIS — IMO0002 Reserved for concepts with insufficient information to code with codable children: Secondary | ICD-10-CM

## 2014-02-07 DIAGNOSIS — E1065 Type 1 diabetes mellitus with hyperglycemia: Secondary | ICD-10-CM

## 2014-02-20 ENCOUNTER — Ambulatory Visit: Payer: Commercial Indemnity | Admitting: "Endocrinology

## 2014-04-27 ENCOUNTER — Ambulatory Visit: Payer: Commercial Indemnity | Admitting: "Endocrinology

## 2014-06-03 ENCOUNTER — Other Ambulatory Visit: Payer: Self-pay | Admitting: "Endocrinology

## 2014-06-06 ENCOUNTER — Other Ambulatory Visit: Payer: Self-pay | Admitting: *Deleted

## 2014-06-06 DIAGNOSIS — E1065 Type 1 diabetes mellitus with hyperglycemia: Secondary | ICD-10-CM

## 2014-06-06 DIAGNOSIS — IMO0002 Reserved for concepts with insufficient information to code with codable children: Secondary | ICD-10-CM

## 2014-06-06 MED ORDER — INSULIN ASPART 100 UNIT/ML ~~LOC~~ SOLN: SUBCUTANEOUS | Status: DC

## 2014-07-19 ENCOUNTER — Ambulatory Visit: Payer: Commercial Indemnity | Admitting: "Endocrinology

## 2014-08-03 ENCOUNTER — Encounter: Payer: Self-pay | Admitting: "Endocrinology

## 2014-08-03 ENCOUNTER — Ambulatory Visit (INDEPENDENT_AMBULATORY_CARE_PROVIDER_SITE_OTHER): Payer: Commercial Indemnity | Admitting: "Endocrinology

## 2014-08-03 VITALS — BP 128/86 | HR 92 | Wt 225.0 lb

## 2014-08-03 DIAGNOSIS — I471 Supraventricular tachycardia: Secondary | ICD-10-CM

## 2014-08-03 DIAGNOSIS — E049 Nontoxic goiter, unspecified: Secondary | ICD-10-CM

## 2014-08-03 DIAGNOSIS — Z23 Encounter for immunization: Secondary | ICD-10-CM

## 2014-08-03 DIAGNOSIS — I10 Essential (primary) hypertension: Secondary | ICD-10-CM

## 2014-08-03 DIAGNOSIS — R000 Tachycardia, unspecified: Secondary | ICD-10-CM

## 2014-08-03 DIAGNOSIS — E1043 Type 1 diabetes mellitus with diabetic autonomic (poly)neuropathy: Secondary | ICD-10-CM

## 2014-08-03 DIAGNOSIS — E669 Obesity, unspecified: Secondary | ICD-10-CM

## 2014-08-03 DIAGNOSIS — Z87898 Personal history of other specified conditions: Secondary | ICD-10-CM

## 2014-08-03 DIAGNOSIS — E7800 Pure hypercholesterolemia, unspecified: Secondary | ICD-10-CM

## 2014-08-03 DIAGNOSIS — E10649 Type 1 diabetes mellitus with hypoglycemia without coma: Secondary | ICD-10-CM

## 2014-08-03 DIAGNOSIS — E109 Type 1 diabetes mellitus without complications: Secondary | ICD-10-CM

## 2014-08-03 LAB — COMPREHENSIVE METABOLIC PANEL
ALT: 18 U/L (ref 0–53)
AST: 13 U/L (ref 0–37)
Albumin: 4.3 g/dL (ref 3.5–5.2)
Alkaline Phosphatase: 67 U/L (ref 39–117)
BUN: 18 mg/dL (ref 6–23)
CO2: 26 meq/L (ref 19–32)
CREATININE: 0.84 mg/dL (ref 0.50–1.35)
Calcium: 9.6 mg/dL (ref 8.4–10.5)
Chloride: 103 mEq/L (ref 96–112)
GLUCOSE: 191 mg/dL — AB (ref 70–99)
Potassium: 4.6 mEq/L (ref 3.5–5.3)
Sodium: 139 mEq/L (ref 135–145)
Total Bilirubin: 0.6 mg/dL (ref 0.2–1.2)
Total Protein: 6.7 g/dL (ref 6.0–8.3)

## 2014-08-03 LAB — GLUCOSE, POCT (MANUAL RESULT ENTRY): POC Glucose: 250 mg/dl — AB (ref 70–99)

## 2014-08-03 LAB — T4, FREE: FREE T4: 1.1 ng/dL (ref 0.80–1.80)

## 2014-08-03 LAB — T3, FREE: T3 FREE: 3.7 pg/mL (ref 2.3–4.2)

## 2014-08-03 LAB — LIPID PANEL
CHOLESTEROL: 260 mg/dL — AB (ref 0–200)
HDL: 51 mg/dL (ref >39)
LDL Cholesterol: 191 mg/dL — ABNORMAL HIGH (ref 0–99)
TRIGLYCERIDES: 90 mg/dL (ref <150)
Total CHOL/HDL Ratio: 5.1 Ratio
VLDL: 18 mg/dL (ref 0–40)

## 2014-08-03 LAB — TSH: TSH: 0.548 u[IU]/mL (ref 0.350–4.500)

## 2014-08-03 LAB — POCT GLYCOSYLATED HEMOGLOBIN (HGB A1C): Hemoglobin A1C: 9.5

## 2014-08-03 NOTE — Progress Notes (Signed)
Subjective:  Patient Name: [name] Date of Birth: April 17, 1981  MRN: [id number]  Ashely Joshua  presents to the office today for follow-up of his type 1 diabetes mellitus, goiter, obesity, hypercholesterolemia, ADHD, fatigue, autonomic neuropathy, tachycardia, hypertension, microalbuminuria, and hypoglycemia.  HISTORY OF PRESENT ILLNESS:   [name] is a 33 y.o. Caucasian young man. [name] was unaccompanied.  1. The patient was first referred to me on 01/08/2006 by his family physician, Dr. Roanna Epley, for evaluation and management of type 1 diabetes mellitus and related problems. Patient was 33 years old.  A. The patient had been diagnosed with type 1 diabetes somewhere in 2001-2002, at the age of 74-19. He was initially diagnosed with type 2 diabetes mellitus, but was later re-classified as type 1 diabetes mellitus. He had been followed by Dr. Arther Dames, staff endocrinologist at [facility]. Patient was started on an insulin pump approximately 18 months prior. His pump was a Medtronic Paradigm 712. His blood glucose control was fair-to-poor. He was frequently not checking blood sugars or taking insulin boluses as often as he needed to. He frequently noted fast heart rate. The patient's past medical history was positive for hypertension, for which he was taking lisinopril, and for what his mother called "extreme ADHD". He had previously been taking ADHD medicines but had discontinued them. He had prior ankle injuries and knee injuries, to included tears of the lateral menisci bilaterally. He had not had any surgeries. He was working for a [employer] that had many different jobs sites in different parts of the [place] and in other countries as well. As a result, the patient was on the road a lot. He did not use tobacco or drugs, but did drink alcohol occasionally.  B. On physical examination, his weight was 234 pounds, his height was 70 inches, his BMI was 33.2. Blood pressure was 128/70. Hemoglobin A1c  was 9.5%. Heart rate was 96. He had normal affect and fair insight. He had a 25-30 gram goiter. He had normal 1+ DP pulses in his feet. He had normal sensation in his feet to touch, vibration, and monofilament. His CMP was normal except for glucose of 251. His cholesterol was 257, triglycerides 143, HDL 49, and LDL 179. TSH was 1.017, free T4 was 1.06, and free T3 was 3.3. His urinary microalbumin: creatinine ratio was 20.1 (normal less than 30).   C. The patient clearly needed better blood glucose control, which would only occur if he had better adherence to his plan. He appeared to have autonomic neuropathy, manifested by tachycardia.  His thyroid goiter suggested that he might have evolving Hashimoto's disease. He stated that he had lost some weight recently. I encouraged him to check his blood sugars more frequently and to take both correction boluses and food boluses.  2. During the past eight years, the patient's blood glucose control has occasionally been better, but frequently been worse. His hemoglobin A1c values have varied from 8.3-11.2%. In the last year, however, his A1c's have varied from 9.6-10.1%. Throughout the 8 year period, the patient's autonomic neuropathy and tachycardia have remained essentially the same. On 12/03/10 his total cholesterol was 270, triglycerides 94, HDL 50, and LDL 201. I started him on Crestor, 10 mg per day. However at the time of his visit on 03/03/11, the total cholesterol was 244, triglycerides 102, HDL 40, and LDL 184. Unfortunately, he had been without Crestor for 10 days prior to those lab tests.   3. The patient's last [facility] visit was on 10/05/13.in  the interim he has been fairly healthy, but he has had more nausea and stomach bloating after taking a lot of dairy products. He has his new Enlyte sensor, but has been getting rashes from the adhesive, so he stopped using the sensor. When he was using the sensor it seemed to correlate well with his BG values. The left  shoulder problems continue to improve. He no longer has back problems. He just received his flu shot today. He is using Novolog aspart insulin in his insulin pump. He still takes lisinopril, 5 mg/day and atorvastatin, 20 mg/day pretty reliably. His fatigue has resolved. His diet is pretty good. He has been exercising more.   4. Pertinent Review of Systems: Constitutional: The patient feels "good".  Eyes: Vision is good. There are no significant eye complaints. Last exam was in June 2015. There were no signs of DM disease.   Neck: The patient had not had any anterior superior central  neck soreness and tenderness recently. He has had no complaints of anterior neck swelling, pressure, discomfort, or difficulty swallowing.  Heart: Heart rate increases with exercise or other physical activity. The patient has no complaints of palpitations, irregular heat beats, chest pain, or chest pressure. Gastrointestinal: He has become more lactose intolerant over time. Bowel movents seem normal. The patient has no other complaints of excessive hunger, heartburn, acid reflux, upset stomach, stomach aches or pains, diarrhea, or constipation. Legs: Muscle mass and strength seem normal. There are no complaints of numbness, tingling, burning, or pain. No edema is noted. Feet: There are no obvious foot problems. There are no complaints of numbness, tingling, burning, or pain. No edema is noted. GU: No problems with libido or performance. Hypoglycemia: He has 1-3 episodes per week, usually associated with delaying the dinner meal due to working late or occurring after working out.       5. BG printout:  He checks BGs anywhere from 0-5 times per day. Sometimes he goes for up to 72 hours without checking BGs. He boluses from 2-7 times per day. Most of his boluses are food boluses, not correction boluses. He says that he checks more often than he inputs BG values, because if the BG value is good, he knows that value would not  affect his insulin doses. I explained to him that I need to see all BG checks so that I can verify to the [facility] that he should be allowed to continue to drive. Most of his high BGs occur in the late afternoons and evenings, usually when his sites go bad or when it has been a long time between BG checks and/or boluses. He changes sites every 3-4 days, but often delays a day or more than he should have done. He has not been following the Hyperglycemia Protocol in response to higher BGs. He has a lot of BG values > 400, usually associated with not responding to bad sites soon enough. His lowest BG was 124.   PAST MEDICAL, FAMILY, AND SOCIAL HISTORY:  Past Medical History  Diagnosis Date  . Type 1 diabetes mellitus not at goal   . Goiter   . Obesity   . Hypercholesterolemia   . ADHD (attention deficit hyperactivity disorder)   . Fatigue   . Tachycardia   . Autonomic neuropathy due to diabetes   . Hypertension   . Uncontrolled DM with microalbuminuria or microproteinuria   . Hypoglycemia associated with diabetes     Family History  Problem Relation Age of Onset  .  Diabetes Maternal Grandmother     T2 DM  . Cancer Maternal Grandfather   . Thyroid disease Neg Hx     Current outpatient prescriptions:atorvastatin (LIPITOR) 20 MG tablet, Take 1 tablet (20 mg total) by mouth daily., Disp: 30 tablet, Rfl: 6;  cyclobenzaprine (FLEXERIL) 10 MG tablet, Take 1 tablet (10 mg total) by mouth 3 (three) times daily as needed for muscle spasms., Disp: 30 tablet, Rfl: 0;  glucose blood (ONE TOUCH ULTRA TEST) test strip, Check glucose 6x daily, Disp: 200 each, Rfl: 6 insulin aspart (NOVOLOG) 100 UNIT/ML injection, 300 units in insulin pump every 48 hrs, Disp: 50 mL, Rfl: 6;  lisinopril (PRINIVIL,ZESTRIL) 5 MG tablet, Take 1 tablet (5 mg total) by mouth daily., Disp: 90 tablet, Rfl: 3;  insulin aspart (NOVOLOG FLEXPEN) 100 UNIT/ML SOPN FlexPen, Use if pump fails up to 50 units daily, Disp: 5 pen, Rfl:  6  Allergies as of 08/03/2014  . (No Known Allergies)    1. Work and Family: [place] He will probably be in this job for the next 4 months. It is more convenient now for him to check BGs and to take boluses during the work week.  2. Activities: He has been physically active at work. He continues to go to the gym when he can.  3. Smoking, alcohol, or drugs: Occasional beer. No tobacco or drugs. 4. Primary Care Provider: None  REVIEW OF SYSTEMS: There are no other significant problems involving Altin's other body systems.   Objective:  Vital Signs:  BP 128/86  Pulse 92  Wt 225 lb (102.059 kg)   Ht Readings from Last 3 Encounters:  11/02/13 6' (1.829 m)   Wt Readings from Last 3 Encounters:  08/03/14 225 lb (102.059 kg)  11/02/13 240 lb (108.863 kg)  10/05/13 239 lb (108.41 kg)   PHYSICAL EXAM:  Constitutional: The patient appears obese, but otherwise healthy. He has lost 14 ponds in 10 months. He is alert and bright, but again looks a bit tired. Face: The face appears normal.  Eyes: There is no obvious arcus or proptosis. Moisture appears normal. Mouth: The oropharynx and tongue appear normal. Oral moisture is normal. There is no evidence for a mandibular infection.  Neck: The neck appears to be visibly normal. No carotid bruits are noted. His strap muscles are larger c/w weight lifting. It has become more difficult to assess his thyroid gland size. The thyroid gland is probably larger at 23-25 grams in size. The consistency of the thyroid gland is soft today. The thyroid gland is not tender to palpation.  Lungs: The lungs are clear to auscultation. Air movement is good.  Heart: Heart rate and rhythm are regular. Heart sounds S1 and S2 are normal. I did not appreciate any pathologic cardiac murmurs. Abdomen: The abdomen is enlarged. Bowel sounds are normal. There is no obvious hepatomegaly, splenomegaly, or other mass effect.  Arms: Muscle size and  bulk are normal for age.  Hands: There is no tremor of his hands today. Phalangeal and metacarpophalangeal joints are normal. Palmar muscles are normal. Palmar skin is normal. Palmar moisture is also normal. Legs: Muscles appear normal for age. No edema is present. Feet: Feet are normally formed. Dorsalis pedal pulses are normal 1+ bilaterally. Tinea pedis of the calluses of the balls of his feet has resolved.  Neurologic: Strength is normal for age in both the upper and lower extremities. Muscle tone is normal. Sensation to touch is normal in  both the legs and feet.    LAB DATA: Hemoglobin A1c is 9.5% today, compared with 9.9%, at last visit and with 9.4% at the prior visit.  Labs 04/23/13: CMP normal, except glucose 144; cholesterol 216, triglycerides 97, HDL 39, LDL 158; urinary microalbumin/creatinine ratio 6.9; TSH 0.505, free T4 1.17, free T3 3.8  Labs: 01/21/12: TSH 1.251, free T4 1.10, free T3 3.6, CBC normal, CMP normal except for glucose 206, urinary microalbumin/creatinine ratio 6.3, cholesterol 187, triglycerides 67, HDL 45, LDL 129   Assessment and Plan:   ASSESSMENT:  1. T1DM: His BG control overall is better. He is still missing many opportunities to check BG and to take enough insulin. On the days in which he does check BG frequently and does take both correction boluses and food boluses, his BGs are usually lower. He often allows sites that are going bad to remain in place too long.  2. Hypoglycemia: He has not had any documented low BGs this month.   3. Hypertension: BP is higher today, c/w less exercise. He needs to take meds regularly and to exercise regularly.  4. Hypercholesterolemia: His lipids need to be re-checked. He will do so now. He absolutely needs to remain on his atorvastatin in order to prevent development of ASCVD.  5. Autonomic neuropathy and tachycardia: These problems improved when his BGs were better two visits ago. Unfortunately, these problems worsened when  his BGs increased. They are completely reversible if he get BGs under control. 6. Obesity: Weight has decreased again. He needs to continue to eat right and to exercise right. 7. Goiter: He was euthyroid in March 2013, but was borderline hyperthyroid in June 2014. I suspected that he had had a recent flare up of Hashitoxicosis. He was supposed to repeat labs in April, but they were not drawn. We need to do so now.  8. Fatigue: This problem has improved.   PLAN:  1. Diagnostic:  Repeat lipid panel, TSI, and TFTs, CMP, and urine microalbumin/creatinine.  2. Therapeutic: Follow DM care plan. 3. Patient education: Discussed slow progression of long-term complications, especially when HTN and hyperlipidemia are co-morbidities. Walk for at least 30 minutes every day.  4. Follow-up: 3 months  Level of Service: This visit lasted in excess of 60 minutes. More than 50% of the visit was devoted to counseling.  David Stall, MD 08/03/2014 10:39 AM

## 2014-08-03 NOTE — Patient Instructions (Signed)
Follow up visit in 3 months. 

## 2014-08-04 LAB — THYROID PEROXIDASE ANTIBODY: Thyroperoxidase Ab SerPl-aCnc: <1 IU/mL (ref <9)

## 2014-08-04 LAB — MICROALBUMIN / CREATININE URINE RATIO
Creatinine, Urine: 177.8 mg/dL
Microalb Creat Ratio: 27 mg/g (ref 0.0–30.0)
Microalb, Ur: 4.8 mg/dL — ABNORMAL HIGH

## 2014-08-08 LAB — THYROID STIMULATING IMMUNOGLOBULIN: TSI: 30 %{baseline} (ref <140)

## 2014-08-23 ENCOUNTER — Telehealth: Payer: Self-pay | Admitting: *Deleted

## 2014-08-23 NOTE — Telephone Encounter (Signed)
TC to patient per Dr. Fransico MichaelBrennan, after reviewing pump download with Dr. Fransico MichaelBrennan, he said that if patient can change sites every third day, and has a good site the setting are in target 145-172, 119-197 and 91-175. Pump download shows that when does not change site or has bad site then bg values remain higher. At this time current settings are correct. Patient stated understanding and has no other questions at this time. LI

## 2014-08-29 ENCOUNTER — Other Ambulatory Visit: Payer: Self-pay | Admitting: *Deleted

## 2014-08-29 DIAGNOSIS — E1065 Type 1 diabetes mellitus with hyperglycemia: Secondary | ICD-10-CM

## 2014-08-29 DIAGNOSIS — IMO0002 Reserved for concepts with insufficient information to code with codable children: Secondary | ICD-10-CM

## 2014-08-29 MED ORDER — GLUCOSE BLOOD VI STRP: ORAL_STRIP | Status: DC

## 2014-10-06 ENCOUNTER — Telehealth: Payer: Self-pay | Admitting: *Deleted

## 2014-10-06 NOTE — Telephone Encounter (Signed)
Spoke to patient advised that per Dr. Fransico MichaelBrennan TFTs were normal at the upper end of the normal range. TSI and TPO antibodies were normal. CMP was normal except for a glucose of 191. Urine microalbumin/creatinine ratio was 27, still within the upper limit of normal at 30, but higher than before. Cholesterol and LDL are higher. He needs to really work on BG control. If he has not been taking his atorvastatin, 20 mg/day regularly, he needs to do so. If he has been taking atorvastatin regularly, he needs to increase the dose to 40 mg/day. Patient advises he has not been taking his atorvastatin. I advised that he need to take it daily. Advised we would recheck his labs at his next visit after he has been back on the medication regularly.

## 2014-10-26 ENCOUNTER — Other Ambulatory Visit: Payer: Self-pay | Admitting: "Endocrinology

## 2014-11-09 ENCOUNTER — Ambulatory Visit: Payer: Commercial Indemnity | Admitting: "Endocrinology

## 2014-12-14 ENCOUNTER — Ambulatory Visit: Payer: Commercial Indemnity | Admitting: "Endocrinology

## 2014-12-28 ENCOUNTER — Encounter: Payer: Self-pay | Admitting: "Endocrinology

## 2014-12-28 ENCOUNTER — Ambulatory Visit (INDEPENDENT_AMBULATORY_CARE_PROVIDER_SITE_OTHER): Payer: Commercial Indemnity | Admitting: "Endocrinology

## 2014-12-28 VITALS — BP 129/82 | HR 103 | Wt 205.0 lb

## 2014-12-28 DIAGNOSIS — R000 Tachycardia, unspecified: Secondary | ICD-10-CM

## 2014-12-28 DIAGNOSIS — E10649 Type 1 diabetes mellitus with hypoglycemia without coma: Secondary | ICD-10-CM

## 2014-12-28 DIAGNOSIS — IMO0002 Reserved for concepts with insufficient information to code with codable children: Secondary | ICD-10-CM

## 2014-12-28 DIAGNOSIS — I471 Supraventricular tachycardia: Secondary | ICD-10-CM

## 2014-12-28 DIAGNOSIS — E7800 Pure hypercholesterolemia, unspecified: Secondary | ICD-10-CM

## 2014-12-28 DIAGNOSIS — E049 Nontoxic goiter, unspecified: Secondary | ICD-10-CM

## 2014-12-28 DIAGNOSIS — I10 Essential (primary) hypertension: Secondary | ICD-10-CM

## 2014-12-28 DIAGNOSIS — E1043 Type 1 diabetes mellitus with diabetic autonomic (poly)neuropathy: Secondary | ICD-10-CM

## 2014-12-28 DIAGNOSIS — E1065 Type 1 diabetes mellitus with hyperglycemia: Secondary | ICD-10-CM

## 2014-12-28 DIAGNOSIS — I4711 Inappropriate sinus tachycardia, so stated: Secondary | ICD-10-CM

## 2014-12-28 LAB — GLUCOSE, POCT (MANUAL RESULT ENTRY): POC Glucose: 190 mg/dl — AB (ref 70–99)

## 2014-12-28 LAB — POCT GLYCOSYLATED HEMOGLOBIN (HGB A1C): HEMOGLOBIN A1C: 9.5

## 2014-12-28 NOTE — Patient Instructions (Signed)
Follow up visit in 3 months. 

## 2014-12-28 NOTE — Progress Notes (Signed)
Subjective:  Patient Name: [name] Date of Birth: 04/22/1981  MRN: [id number]  [name]  presents to the office today for follow-up of his type 1 diabetes mellitus, goiter, obesity, hypercholesterolemia, ADHD, fatigue, autonomic neuropathy, tachycardia, hypertension, microalbuminuria, and hypoglycemia.  HISTORY OF PRESENT ILLNESS:   [name] is a 34 y.o. Caucasian young man. [name] was unaccompanied.  1. The patient was first referred to me on 01/08/2006 by his family physician, Dr. Roanna Epley, for evaluation and management of type 1 diabetes mellitus and related problems. Patient was 34 years old.  A. The patient had been diagnosed with type 1 diabetes somewhere in 2001-2002, at the age of 16-19. He was initially diagnosed with type 2 diabetes mellitus, but was later re-classified as type 1 diabetes mellitus. He had been followed by Dr. Arther Dames, staff endocrinologist at [facility]. Patient was started on an insulin pump approximately 18 months prior. His pump was a Medtronic Paradigm 712. His blood glucose control was fair-to-poor. He was frequently not checking blood sugars or taking insulin boluses as often as he needed to. He frequently noted fast heart rate. The patient's past medical history was positive for hypertension, for which he was taking lisinopril, and for what his mother called "extreme ADHD". He had previously been taking ADHD medicines but had discontinued them. He had prior ankle injuries and knee injuries, to included tears of the lateral menisci bilaterally. He had not had any surgeries. He was working for a [employer] that had many different jobs sites in different parts of the [place] and in other countries as well. As a result, the patient was on the road a lot. He did not use tobacco or drugs, but did drink alcohol occasionally.  B. On physical examination, his weight was 234 pounds, his height was 70 inches, his BMI was 33.2. Blood pressure was 128/70. Hemoglobin A1c  was 9.5%. Heart rate was 96. He had normal affect and fair insight. He had a 25-30 gram goiter. He had normal 1+ DP pulses in his feet. He had normal sensation in his feet to touch, vibration, and monofilament. His CMP was normal except for glucose of 251. His cholesterol was 257, triglycerides 143, HDL 49, and LDL 179. TSH was 1.017, free T4 was 1.06, and free T3 was 3.3. His urinary microalbumin: creatinine ratio was 20.1 (normal less than 30).   C. The patient clearly needed better blood glucose control, which would only occur if he had better adherence to his plan. He appeared to have autonomic neuropathy, manifested by tachycardia.  His thyroid goiter suggested that he might have evolving Hashimoto's disease. He stated that he had lost some weight recently. I encouraged him to check his blood sugars more frequently and to take both correction boluses and food boluses.  2. During the past nine years, the patient's blood glucose control has occasionally been better, but frequently been worse. His hemoglobin A1c values have varied from 8.3-11.2%. In the last year, however, his A1c's have varied from 9.6-10.1%. Throughout the 8 year period, the patient's autonomic neuropathy and tachycardia have remained essentially the same. On 12/03/10 his total cholesterol was 270, triglycerides 94, HDL 50, and LDL 201. I started him on Crestor, 10 mg per day. However at the time of his visit on 03/03/11, the total cholesterol was 244, triglycerides 102, HDL 40, and LDL 184. Unfortunately, he had stopped taking Crestor prior to those lab tests.   3. The patient's last [facility] visit was on 08/03/14.in the interim he  has been fairly healthy, but he has had a "light cold" for about the last week. About a month ago he had more coughing and once coughed up blood. [facility] his [facility] were negative. He was given antibiotics for a presumed bronchitis. He has his new Enlite sensor, but has not been using it.  He had been getting rashes from the former adhesive, so he stopped using the sensor. Medtronic is sending him some new adhesive now. The left shoulder is pretty much back to normal.  He no longer has many back problems. He is using Novolog aspart insulin in his insulin pump. He still takes lisinopril, 5 mg/day and atorvastatin, 20 mg/day fairly reliably. His fatigue has increased again due to long work hours. His diet has not been good. He has been exercising a bit more.   4. Pertinent Review of Systems: Constitutional: The patient feels "a little sick".  Eyes: Vision is good. There are no significant eye complaints. Last exam was in June 2015. There were no signs of DM disease.   Neck: The patient had not had any anterior superior central  neck soreness and tenderness recently. He has had no complaints of anterior neck swelling, pressure, discomfort, or difficulty swallowing.  Heart: Heart rate increases with exercise or other physical activity. The patient has no complaints of palpitations, irregular heat beats, chest pain, or chest pressure. Gastrointestinal: He has become more lactose intolerant over time. Bowel movents seem normal. The patient has no other complaints of excessive hunger, heartburn, acid reflux, upset stomach, stomach aches or pains, diarrhea, or constipation. Legs: Muscle mass and strength seem normal. There are no complaints of numbness, tingling, burning, or pain. No edema is noted. Feet: There are no obvious foot problems. There are no complaints of numbness, tingling, burning, or pain. No edema is noted. GU: No problems with libido or performance. Hypoglycemia: He has 1-2 episodes per week, usually associated with eating lighter lunches or with exercise.                           5. BG printout:  He changes his sites every 1-4 days. He checks BGs anywhere from 0-5 times per day. Sometimes he goes for up to 5 days without checking BGs. He boluses from 1-5 times per day. Most of  his boluses are food boluses, not correction boluses. He says that he checks more often than he inputs BG values, because if the BG value is good, he knows that value would not affect his insulin doses. I explained to him that I need to see all BG checks so that I can verify to the [facility] that he should be allowed to continue to drive. Most of his high BGs occur in the late afternoons and evenings, usually when his sites go bad or when it has been a long time between BG checks and/or boluses. His average BG was 295, range 67 to >400   PAST MEDICAL, FAMILY, AND SOCIAL HISTORY:  Past Medical History  Diagnosis Date  . Type 1 diabetes mellitus not at goal   . Goiter   . Obesity   . Hypercholesterolemia   . ADHD (attention deficit hyperactivity disorder)   . Fatigue   . Tachycardia   . Autonomic neuropathy due to diabetes   . Hypertension   . Uncontrolled DM with microalbuminuria or microproteinuria   . Hypoglycemia associated with diabetes     Family  History  Problem Relation Age of Onset  . Diabetes Maternal Grandmother     T2 DM  . Cancer Maternal Grandfather   . Thyroid disease Neg Hx      Current outpatient prescriptions:  .  atorvastatin (LIPITOR) 20 MG tablet, TAKE 1 TABLET BY MOUTH EVERY DAY, Disp: 30 tablet, Rfl: 5 .  glucose blood (ONE TOUCH ULTRA TEST) test strip, Check glucose 6x daily, Disp: 200 each, Rfl: 6 .  insulin aspart (NOVOLOG FLEXPEN) 100 UNIT/ML SOPN FlexPen, Use if pump fails up to 50 units daily, Disp: 5 pen, Rfl: 6 .  insulin aspart (NOVOLOG) 100 UNIT/ML injection, 300 units in insulin pump every 48 hrs, Disp: 50 mL, Rfl: 6 .  lisinopril (PRINIVIL,ZESTRIL) 5 MG tablet, TAKE 1 TABLET BY MOUTH EVERY DAY, Disp: 90 tablet, Rfl: 5 .  cyclobenzaprine (FLEXERIL) 10 MG tablet, Take 1 tablet (10 mg total) by mouth 3 (three) times daily as needed for muscle spasms. (Patient not taking: Reported on 12/28/2014), Disp: 30 tablet, Rfl: 0  Allergies as of 12/28/2014  . (No  Known Allergies)    1. Work and Family: [place] He will probably be in this job for the next 4 weeks. 2. Activities: He has been physically active at work. He continues to walk or to go to the gym when he can.  3. Smoking, alcohol, or drugs: Occasional beer. No tobacco or drugs. 4. Primary Care Provider: None  REVIEW OF SYSTEMS: There are no other significant problems involving [name]'s other body systems.   Objective:  Vital Signs:  BP 129/82 mmHg  Pulse 103  Wt 205 lb (92.987 kg)   Ht Readings from Last 3 Encounters:  11/02/13 6' (1.829 m)   Wt Readings from Last 3 Encounters:  12/28/14 205 lb (92.987 kg)  08/03/14 225 lb (102.059 kg)  11/02/13 240 lb (108.863 kg)   PHYSICAL EXAM:  Constitutional: The patient appears obese, but otherwise healthy. He has lost 20 pounds in 4 months. He is alert and bright, but again looks a bit tired. Face: The face appears normal.  Eyes: There is no obvious arcus or proptosis. Moisture appears normal. Mouth: The oropharynx and tongue appear normal. Oral moisture is normal. There is no evidence for a mandibular infection.  Neck: The neck appears to be visibly normal. No carotid bruits are noted. His strap muscles are larger c/w weight lifting. It has become more difficult to assess his thyroid gland size. The thyroid gland is probably smaller at about 23 grams in size. The consistency of the thyroid gland is soft today. The thyroid gland is not tender to palpation.  Lungs: The lungs are clear to auscultation. Air movement is good.  Heart: Heart rate and rhythm are regular. Heart sounds S1 and S2 are normal. I did not appreciate any pathologic cardiac murmurs. Abdomen: The abdomen is quite enlarged. Bowel sounds are normal. There is no obvious hepatomegaly, splenomegaly, or other mass effect.  Arms: Muscle size and bulk are normal for age.  Hands: There is no tremor of his hands today. Phalangeal and  metacarpophalangeal joints are normal. Palmar muscles are normal. Palmar skin is normal. Palmar moisture is also normal. Legs: Muscles appear normal for age. No edema is present. Feet: Feet are normally formed. Dorsalis pedal pulses are normal 1+ bilaterally. Tinea pedis of the calluses of the balls of his feet has resolved.  Neurologic: Strength is normal for age in both the upper and lower extremities. Muscle  tone is normal. Sensation to touch is normal in both the legs and feet.    LAB DATA: Hemoglobin A1c is 9.5% today, compared with 9.5%, at last visit and with 9.9% at the prior visit.  Labs 08/03/14: TSH 0.548,free T4 1.10, free T3 3.7, TPO antibody < 1, TSI 30; CMP normal except glucose 191; urinary micralbumin/creatinine rato 27; cholesterol 260, triglycerides 90, HDL 51, LDL 191;   Labs 04/23/13: CMP normal, except glucose 144; cholesterol 216, triglycerides 97, HDL 39, LDL 158; urinary microalbumin/creatinine ratio 6.9; TSH 0.505, free T4 1.17, free T3 3.8  Labs: 01/21/12: TSH 1.251, free T4 1.10, free T3 3.6, CBC normal, CMP normal except for glucose 206, urinary microalbumin/creatinine ratio 6.3, cholesterol 187, triglycerides 67, HDL 45, LDL 129   Assessment and Plan:   ASSESSMENT:  1. T1DM: His BG control overall is about the same. He is still missing many opportunities to check BG and to take enough insulin. On the days in which he does check BG frequently and does take both correction boluses and food boluses, his BGs are usually lower. He often allows sites that are going bad to remain in place far too long.  2. Hypoglycemia: He has had one documented low BG this month of 67.   3. Hypertension: BP is higher today, c/w less exercise. He needs to take meds regularly and to exercise regularly.  4. Hypercholesterolemia: His lipids need to be re-checked. He absolutely needs to remain on his atorvastatin in order to prevent development of ASCVD.  5. Autonomic neuropathy and tachycardia:  These problems improved when his BGs were better three visits ago. Unfortunately, these problems worsened when his BGs increased. They are completely reversible if he get BGs under control. 6. Obesity: Weight has decreased again. Although some of his weight loss may have been due to eating better and to exercising more, I suspect that much of his weight loss is due to under-insulinization. He needs to eat right, to exercise right, to check his BGs, and to take his boluses.  7. Goiter: He was euthyroid in March 2013, but was borderline hyperthyroid in June 2014. He was euthyroid in October 2015.   8. Fatigue: This problem has worsened.   PLAN:  1. Diagnostic:  Repeat HbA1c today.   2. Therapeutic: Follow DM care plan. 3. Patient education: Discussed slow progression of long-term complications, especially when HTN and hyperlipidemia are co-morbidities. Walk for at least 30 minutes every day.  4. Follow-up: 3 months  Level of Service: This visit lasted in excess of 60 minutes. More than 50% of the visit was devoted to counseling.  David Stall, MD 12/28/2014 2:38 PM

## 2015-04-05 ENCOUNTER — Ambulatory Visit: Payer: Commercial Indemnity | Admitting: "Endocrinology

## 2015-05-03 ENCOUNTER — Ambulatory Visit: Payer: Commercial Indemnity | Admitting: "Endocrinology

## 2015-06-11 ENCOUNTER — Ambulatory Visit: Payer: Commercial Indemnity | Admitting: "Endocrinology

## 2015-06-20 ENCOUNTER — Other Ambulatory Visit: Payer: Self-pay | Admitting: "Endocrinology

## 2015-06-25 ENCOUNTER — Ambulatory Visit: Payer: Commercial Indemnity | Admitting: "Endocrinology

## 2015-06-29 ENCOUNTER — Encounter: Payer: Self-pay | Admitting: "Endocrinology

## 2015-06-29 ENCOUNTER — Ambulatory Visit (INDEPENDENT_AMBULATORY_CARE_PROVIDER_SITE_OTHER): Payer: 59 | Admitting: "Endocrinology

## 2015-06-29 VITALS — BP 121/81 | HR 91 | Wt 220.0 lb

## 2015-06-29 DIAGNOSIS — IMO0002 Reserved for concepts with insufficient information to code with codable children: Secondary | ICD-10-CM

## 2015-06-29 DIAGNOSIS — E669 Obesity, unspecified: Secondary | ICD-10-CM

## 2015-06-29 DIAGNOSIS — I4711 Inappropriate sinus tachycardia, so stated: Secondary | ICD-10-CM

## 2015-06-29 DIAGNOSIS — I10 Essential (primary) hypertension: Secondary | ICD-10-CM

## 2015-06-29 DIAGNOSIS — E162 Hypoglycemia, unspecified: Secondary | ICD-10-CM

## 2015-06-29 DIAGNOSIS — E7800 Pure hypercholesterolemia, unspecified: Secondary | ICD-10-CM

## 2015-06-29 DIAGNOSIS — R5383 Other fatigue: Secondary | ICD-10-CM

## 2015-06-29 DIAGNOSIS — I471 Supraventricular tachycardia: Secondary | ICD-10-CM

## 2015-06-29 DIAGNOSIS — E1065 Type 1 diabetes mellitus with hyperglycemia: Secondary | ICD-10-CM | POA: Diagnosis not present

## 2015-06-29 DIAGNOSIS — E1043 Type 1 diabetes mellitus with diabetic autonomic (poly)neuropathy: Secondary | ICD-10-CM

## 2015-06-29 DIAGNOSIS — E049 Nontoxic goiter, unspecified: Secondary | ICD-10-CM

## 2015-06-29 DIAGNOSIS — R000 Tachycardia, unspecified: Secondary | ICD-10-CM | POA: Insufficient documentation

## 2015-06-29 LAB — POCT GLYCOSYLATED HEMOGLOBIN (HGB A1C): HEMOGLOBIN A1C: 9.6

## 2015-06-29 LAB — GLUCOSE, POCT (MANUAL RESULT ENTRY): POC Glucose: 223 mg/dl — AB (ref 70–99)

## 2015-06-29 MED ORDER — LISINOPRIL 5 MG PO TABS: ORAL_TABLET | ORAL | Status: DC

## 2015-06-29 NOTE — Patient Instructions (Signed)
Follow up visit in 3 months. Please increase the lisinopril to 7.5 mg/day = 1.5 of the 5 mg tablets per day.

## 2015-06-29 NOTE — Progress Notes (Signed)
Subjective:  Patient Name: [name] Date of Birth: 06/08/1981  MRN: [id number]  [name]  presents to the office today for follow-up of his type 1 diabetes mellitus, goiter, obesity, hypercholesterolemia, ADHD, fatigue, autonomic neuropathy, tachycardia, hypertension, microalbuminuria, and hypoglycemia.  HISTORY OF PRESENT ILLNESS:   [name] is a 34 y.o. Caucasian young man. [name] was unaccompanied.  1. The patient was first referred to me on 01/08/2006 by his family physician, Dr. Roanna Epley, for evaluation and management of type 1 diabetes mellitus and related problems. Patient was 34 years old.  A. The patient had been diagnosed with type 1 diabetes somewhere in 2001-2002, at the age of 85-19. He was initially diagnosed with type 2 diabetes mellitus, but was later re-classified as type 1 diabetes mellitus. He had been followed by Dr. Arther Dames, staff endocrinologist at [facility]. Patient was started on an insulin pump approximately 18 months prior. His pump was a Medtronic Paradigm 712. His blood glucose control was fair-to-poor. He was frequently not checking blood sugars or taking insulin boluses as often as he needed to. He frequently noted fast heart rate. The patient's past medical history was positive for hypertension, for which he was taking lisinopril, and for what his mother called "extreme ADHD". He had previously been taking ADHD medicines but had discontinued them. He had prior ankle injuries and knee injuries, to include tears of the lateral menisci bilaterally. He had not had any surgeries. He was working for a [employer] that had many different jobs sites in different parts of the [place] and in other countries as well. As a result, the patient was on the road a lot. He did not use tobacco or drugs, but did drink alcohol occasionally.  B. On physical examination, his weight was 234 pounds, his height was 70 inches, his BMI was 33.2. Blood pressure was 128/70. Hemoglobin A1c  was 9.5%. Heart rate was 96. He had normal affect and fair insight. He had a 25-30 gram goiter. He had normal 1+ DP pulses in his feet. He had normal sensation in his feet to touch, vibration, and monofilament. His CMP was normal except for glucose of 251. His cholesterol was 257, triglycerides 143, HDL 49, and LDL 179. TSH was 1.017, free T4 was 1.06, and free T3 was 3.3. His urinary microalbumin: creatinine ratio was 20.1 (normal less than 30).   C. The patient clearly needed better blood glucose control, which would only occur if he had better adherence to his plan. He appeared to have autonomic neuropathy, manifested by tachycardia.  His thyroid goiter suggested that he might have evolving Hashimoto's disease. He stated that he had lost some weight recently. I encouraged him to check his blood sugars more frequently and to take both correction boluses and food boluses.  2. During the past nine years, the patient's blood glucose control has occasionally been better, but frequently been worse. His hemoglobin A1c values have varied from 8.3-11.2%. In the last year, however, his A1c's have varied from 9.5-9.9%. Throughout the 9 year period, the patient's autonomic neuropathy and tachycardia have remained essentially the same. On 12/03/10 his total cholesterol was 270, triglycerides 94, HDL 50, and LDL 201. I started him on Crestor, 10 mg per day. However at the time of his visit on 03/03/11, the total cholesterol was 244, triglycerides 102, HDL 40, and LDL 184. Unfortunately, he had stopped taking Crestor prior to those lab tests.   3. The patient's last [facility] visit was on 12/28/14.in the interim he  has been fairly healthy, but he has had more lactose intolerance recently. Some foods such as shellfish, now cause his throat to swell up. He wants to see an allergist. I suggested Dr. Colonel Bald and Dr. Laurette Schimke.   He has his new Enlite sensor, but has not been using it. He had been getting rashes from the former  adhesive, so he stopped using the sensor. [employer] was supposed to send him some new adhesive, but did not. The left shoulder is pretty much back to normal.  He no longer has many back problems. He is using Novolog aspart insulin in his insulin pump. He still takes lisinopril, 5 mg/day and atorvastatin, 20 mg/day fairly reliably. His fatigue has increased again due to long work hours. His diet has been "up and down". He has not  been exercising very much.   4. Pertinent Review of Systems: Constitutional: The patient feels "pretty good".  Eyes: Vision is good. There are no significant eye complaints. Last exam was in June 2015 and possibly again in November 2015. There were no signs of DM disease.   Neck: He has not had any complaints of anterior neck swelling, soreness, tenderness, pressure, discomfort, or difficulty swallowing.  Heart: Heart rate increases with exercise or other physical activity. The patient has no complaints of palpitations, irregular heat beats, chest pain, or chest pressure. Gastrointestinal: He has become more lactose intolerant over time and has had more diarrhea. The patient has no other complaints of excessive hunger, heartburn, acid reflux, upset stomach, stomach aches or pains, or constipation. Legs: Muscle mass and strength seem normal. There are no complaints of numbness, tingling, burning, or pain. No edema is noted. Feet: There are no obvious foot problems. There are no complaints of numbness, tingling, burning, or pain. No edema is noted. GU: No problems with libido or performance. Hypoglycemia: He has about 1 episode per week, usually associated with being active and going too long between meals.                            5. BG printout:  He changes his sites every 2-4 days. He checks BGs anywhere from 1-8 times per day. Sometimes he goes for up to 26 hours without checking BGs. He boluses from 3-6 times per day. Many of his boluses are food boluses, not correction  boluses or meal boluses. He says that he checks more often than he inputs BG values, but he is doing better. He sometimes reduces his insulin boluses to prevent low BGs. His average BG was 243, compared with 295 at his last visit. BG range was 72- >400. When his sites are working well and he is checking BGs and giving meal boluses as requested, BGs vary from 108-152.   PAST MEDICAL, FAMILY, AND SOCIAL HISTORY:  Past Medical History  Diagnosis Date  . Type 1 diabetes mellitus not at goal   . Goiter   . Obesity   . Hypercholesterolemia   . ADHD (attention deficit hyperactivity disorder)   . Fatigue   . Tachycardia   . Autonomic neuropathy due to diabetes   . Hypertension   . Uncontrolled DM with microalbuminuria or microproteinuria   . Hypoglycemia associated with diabetes     Family History  Problem Relation Age of Onset  . Diabetes Maternal Grandmother     T2 DM  . Cancer Maternal Grandfather   . Thyroid disease Neg Hx  Current outpatient prescriptions:  .  atorvastatin (LIPITOR) 20 MG tablet, TAKE 1 TABLET BY MOUTH EVERY DAY, Disp: 30 tablet, Rfl: 5 .  glucose blood (ONE TOUCH ULTRA TEST) test strip, Check glucose 6x daily, Disp: 200 each, Rfl: 6 .  insulin aspart (NOVOLOG FLEXPEN) 100 UNIT/ML SOPN FlexPen, Use if pump fails up to 50 units daily, Disp: 5 pen, Rfl: 6 .  lisinopril (PRINIVIL,ZESTRIL) 5 MG tablet, TAKE 1 TABLET BY MOUTH EVERY DAY, Disp: 90 tablet, Rfl: 5 .  NOVOLOG 100 UNIT/ML injection, USE UPTO 300 UNITS IN INSULIN PUMP EVERY 48 HOURS, Disp: 50 mL, Rfl: 0  Allergies as of 06/29/2015  . (No Known Allergies)    1. Work and Family: He is now working throughout the [place] and [place]. He is driving a lot of hours. He hopes to be working in [place] soon.  2. Activities: He has been physically active at work. He has not been exercising in the last month.   3. Smoking, alcohol, or drugs: Occasional beer, about one per month. No tobacco or drugs. 4. Primary  Care Provider: None  REVIEW OF SYSTEMS: There are no other significant problems involving Billey's other body systems.   Objective:  Vital Signs:  BP 121/81 mmHg  Pulse 91  Wt 220 lb (99.791 kg)   Ht Readings from Last 3 Encounters:  11/02/13 6' (1.829 m)   Wt Readings from Last 3 Encounters:  06/29/15 220 lb (99.791 kg)  12/28/14 205 lb (92.987 kg)  08/03/14 225 lb (102.059 kg)   PHYSICAL EXAM:  Constitutional: The patient appears obese, but otherwise healthy. He has gained 15 pounds in 6 months. He is alert and bright, but again looks tired. Face: The face appears normal.  Eyes: There is no obvious arcus or proptosis. Moisture appears normal. Mouth: The oropharynx and tongue appear normal. Oral moisture is normal. There is no evidence for a mandibular infection.  Neck: The neck appears to be visibly normal. No carotid bruits are noted. His strap muscles are fairly large, so it is somewhat more difficult to assess his thyroid gland size. The thyroid gland is probably again about 23 grams in size. The right lobe is at the upper limit of normal for size. The left lobe is more enlarged and firmer. The consistency of the right lobe is normally soft today. The thyroid gland is not tender to palpation.  Lungs: The lungs are clear to auscultation. Air movement is good.  Heart: Heart rate and rhythm are regular. Heart sounds S1 and S2 are normal. I did not appreciate any pathologic cardiac murmurs. Abdomen: The abdomen is more enlarged. Bowel sounds are normal. There is no obvious hepatomegaly, splenomegaly, or other mass effect.  Arms: Muscle size and bulk are normal for age.  Hands: There is no tremor of his hands today. Phalangeal and metacarpophalangeal joints are normal. Palmar muscles are normal. Palmar skin is normal. Palmar moisture is also normal. Legs: Muscles appear normal for age. No edema is present. Feet: Feet are normally formed. Dorsalis pedal pulses are normal 1+  bilaterally.  Neurologic: Strength is normal for age in both the upper and lower extremities. Muscle tone is normal. Sensation to touch is normal in both the legs and feet.    LAB DATA:   Lbs 06/29/15. HbA1c 9.6%  Labs 12/28/14: Hemoglobin A1c 9.5% today, compared with 9.5%, at last visit and with 9.9% at the prior visit.  Labs 08/03/14: HbA1c 9.5%; TSH 0.548,free T4 1.10, free T3 3.7, TPO antibody <  1, TSI 30; CMP normal except glucose 191; urinary microalbumin/creatinine ratio 27; cholesterol 260, triglycerides 90, HDL 51, LDL 191   Labs 10/05/13: HbA1c 9.9%  Labs 04/23/13: CMP normal, except glucose 144; cholesterol 216, triglycerides 97, HDL 39, LDL 158; urinary microalbumin/creatinine ratio 6.9; TSH 0.505, free T4 1.17, free T3 3.8  Labs: 01/21/12: TSH 1.251, free T4 1.10, free T3 3.6, CBC normal, CMP normal except for glucose 206, urinary microalbumin/creatinine ratio 6.3, cholesterol 187, triglycerides 67, HDL 45, LDL 129   Assessment and Plan:   ASSESSMENT:  1. T1DM: His HbA1c is slightly elevated, but his BG variability is less. He is not having as many BGs >400 or low BGs. He is doing a better job of checking BGs and taking boluses, but still needs to do more combination correction boluses and food boluses. On the days in which he does check BG frequently and does take both correction boluses and food boluses, his BGs are usually pretty good. He often allows sites that are going bad to remain in place for too long, but he is doing better about this problem as well.  2. Hypoglycemia: He has had one documented low BG this month of 72.   3. Hypertension: BP is higher today, c/w less exercise. He needs to take meds regularly and to exercise regularly. He also needs more lisinopril. 4. Hypercholesterolemia: His lipids need to be re-checked. He absolutely needs to remain on his atorvastatin in order to prevent development of ASCVD.  5. Autonomic neuropathy and tachycardia: These problems  improved when his BGs were better three visits ago. Unfortunately, these problems worsened when his BGs increased. His heart rate is somewhat better now, c/w not having as many high BGs. These problems are completely reversible if he get BGs under control. 6. Obesity: Weight has increased again. Although some of his weight gain may have been due to not being as careful about eating and exercising when he is on the road, some of his weight gain is due to taking more insulin.   7. Goiter: His thyroid gland is larger. He was euthyroid in March 2013, but was borderline hyperthyroid in June 2014. He was euthyroid in October 2015. We need to re-assess his TFTs.   8. Fatigue: This problem has improved.   PLAN:  1. Diagnostic:  Repeat HbA1c today.  Annual surveillance labs prior to next visit.  2. Therapeutic: Follow DM care plan. Increase lisinopril to 7.5 mg/day. 3. Patient education: Discussed slow progression of long-term complications, especially when HTN and hyperlipidemia are co-morbidities. Walk for at least 30 minutes every day.  4. Follow-up: 3 months  Level of Service: This visit lasted in excess of 60 minutes. More than 50% of the visit was devoted to counseling.  David Stall, MD 06/29/2015 9:54 AM

## 2015-08-08 ENCOUNTER — Other Ambulatory Visit: Payer: Self-pay | Admitting: "Endocrinology

## 2015-08-16 ENCOUNTER — Other Ambulatory Visit: Payer: Self-pay | Admitting: *Deleted

## 2015-08-16 ENCOUNTER — Telehealth: Payer: Self-pay | Admitting: "Endocrinology

## 2015-08-16 DIAGNOSIS — IMO0001 Reserved for inherently not codable concepts without codable children: Secondary | ICD-10-CM

## 2015-08-16 DIAGNOSIS — E1065 Type 1 diabetes mellitus with hyperglycemia: Principal | ICD-10-CM

## 2015-08-16 MED ORDER — INSULIN LISPRO 100 UNIT/ML ~~LOC~~ SOLN: SUBCUTANEOUS | Status: DC

## 2015-08-16 NOTE — Telephone Encounter (Signed)
Made in error. [name] °

## 2015-10-01 ENCOUNTER — Ambulatory Visit: Payer: 59 | Admitting: "Endocrinology

## 2015-11-12 ENCOUNTER — Other Ambulatory Visit: Payer: Self-pay | Admitting: "Endocrinology

## 2015-11-14 ENCOUNTER — Ambulatory Visit: Payer: 59 | Admitting: "Endocrinology

## 2015-12-17 ENCOUNTER — Other Ambulatory Visit: Payer: Self-pay | Admitting: "Endocrinology

## 2016-01-07 ENCOUNTER — Encounter: Payer: Self-pay | Admitting: "Endocrinology

## 2016-01-07 ENCOUNTER — Ambulatory Visit (INDEPENDENT_AMBULATORY_CARE_PROVIDER_SITE_OTHER): Payer: 59 | Admitting: "Endocrinology

## 2016-01-07 VITALS — BP 124/86 | HR 95 | Wt 243.0 lb

## 2016-01-07 DIAGNOSIS — E739 Lactose intolerance, unspecified: Secondary | ICD-10-CM

## 2016-01-07 DIAGNOSIS — R5383 Other fatigue: Secondary | ICD-10-CM

## 2016-01-07 DIAGNOSIS — E1065 Type 1 diabetes mellitus with hyperglycemia: Principal | ICD-10-CM

## 2016-01-07 DIAGNOSIS — E669 Obesity, unspecified: Secondary | ICD-10-CM

## 2016-01-07 DIAGNOSIS — I4711 Inappropriate sinus tachycardia, so stated: Secondary | ICD-10-CM

## 2016-01-07 DIAGNOSIS — E10649 Type 1 diabetes mellitus with hypoglycemia without coma: Secondary | ICD-10-CM

## 2016-01-07 DIAGNOSIS — E7800 Pure hypercholesterolemia, unspecified: Secondary | ICD-10-CM

## 2016-01-07 DIAGNOSIS — E1043 Type 1 diabetes mellitus with diabetic autonomic (poly)neuropathy: Secondary | ICD-10-CM

## 2016-01-07 DIAGNOSIS — R000 Tachycardia, unspecified: Secondary | ICD-10-CM | POA: Diagnosis not present

## 2016-01-07 DIAGNOSIS — E109 Type 1 diabetes mellitus without complications: Secondary | ICD-10-CM | POA: Diagnosis not present

## 2016-01-07 DIAGNOSIS — E063 Autoimmune thyroiditis: Secondary | ICD-10-CM

## 2016-01-07 DIAGNOSIS — I10 Essential (primary) hypertension: Secondary | ICD-10-CM

## 2016-01-07 DIAGNOSIS — E049 Nontoxic goiter, unspecified: Secondary | ICD-10-CM

## 2016-01-07 DIAGNOSIS — IMO0001 Reserved for inherently not codable concepts without codable children: Secondary | ICD-10-CM

## 2016-01-07 LAB — GLUCOSE, POCT (MANUAL RESULT ENTRY): POC GLUCOSE: 228 mg/dL — AB (ref 70–99)

## 2016-01-07 LAB — POCT GLYCOSYLATED HEMOGLOBIN (HGB A1C): HEMOGLOBIN A1C: 10.5

## 2016-01-07 NOTE — Progress Notes (Signed)
Subjective:  Patient Name: [name] Date of Birth: August 01, 1981  MRN: [id number]  [name]  presents to the office today for follow-up of his type 1 diabetes mellitus, goiter, obesity, hypercholesterolemia, ADHD, fatigue, autonomic neuropathy, tachycardia, hypertension, microalbuminuria, and hypoglycemia.  HISTORY OF PRESENT ILLNESS:   [name] is a 35 y.o. Caucasian young man. [name] was unaccompanied.  1. The patient was first referred to me on 01/08/2006 by his family physician, Dr. Roanna Epley, for evaluation and management of type 1 diabetes mellitus and related problems. Patient was 35 years old.  A. The patient had been diagnosed with type 1 diabetes somewhere in 2001-2002, at the age of 44-19. He was initially diagnosed with type 2 diabetes mellitus, but was later re-classified as type 1 diabetes mellitus. He had been followed by Dr. Arther Dames, staff endocrinologist at [facility]. Patient was started on an insulin pump approximately 18 months prior. His pump was a Medtronic Paradigm 712. His blood glucose control was fair-to-poor. He was frequently not checking blood sugars or taking insulin boluses as often as he needed to. He frequently noted fast heart rate. The patient's past medical history was positive for hypertension, for which he was taking lisinopril, and for what his mother called "extreme ADHD". He had previously been taking ADHD medicines but had discontinued them. He had prior ankle injuries and knee injuries, to include tears of the lateral menisci bilaterally. He had not had any surgeries. He was working for a [employer] that had many different jobs sites in different parts of the [place] and in other countries as well. As a result, the patient was on the road a lot. He did not use tobacco or drugs, but did drink alcohol occasionally.  B. On physical examination, his weight was 234 pounds, his height was 70 inches, his BMI was 33.2. Blood pressure was 128/70. Hemoglobin A1c  was 9.5%. Heart rate was 96. He had normal affect and fair insight. He had a 25-30 gram goiter. He had normal 1+ DP pulses in his feet. He had normal sensation in his feet to touch, vibration, and monofilament. His CMP was normal except for glucose of 251. His cholesterol was 257, triglycerides 143, HDL 49, and LDL 179. TSH was 1.017, free T4 was 1.06, and free T3 was 3.3. His urinary microalbumin: creatinine ratio was 20.1 (normal less than 30).   C. The patient clearly needed better blood glucose control, which would only occur if he had better adherence to his plan. He appeared to have autonomic neuropathy, manifested by tachycardia.  His thyroid goiter suggested that he might have evolving Hashimoto's disease. He stated that he had lost some weight recently. I encouraged him to check his blood sugars more frequently and to take both correction boluses and food boluses.  2. During the past ten years, the patient's blood glucose control has occasionally been better, but frequently been worse. His hemoglobin A1c values have varied from 8.3-11.2%. In the last year, however, his A1c's have varied from 9.5-9.6%. Throughout the 10 year period, the patient's autonomic neuropathy and tachycardia have remained essentially the same. On 12/03/10 his total cholesterol was 270, triglycerides 94, HDL 50, and LDL 201. I started him on Crestor, 10 mg per day. However at the time of his visit on 03/03/11, the total cholesterol was 244, triglycerides 102, HDL 40, and LDL 184. Unfortunately, he had stopped taking Crestor prior to those lab tests.   3. The patient's last [facility] visit was on 06/29/15.  A.  In the interim he had been fairly healthy, but had an episode of pneumonia in late December that was treated with antibiotics and steroids. Then about two weeks ago he developed a URI from which he is still recovering.    B. He continues to have problems with lactose intolerance. When he avoids lactose, however, he is asymptomatic.  Some foods such as shellfish, now cause his throat to swell up. He has not yet seen an allergist. I again suggested Dr. Colonel Bald and Dr. Laurette Schimke.     C. He has his Enlite sensor, but has not been using it. He had been getting rashes from the former adhesive, so he stopped using the sensor. [employer] was supposed to send him some new adhesive, but did not.  D. The left shoulder is pretty much back to normal.  He no longer has many back problems.   E. He is using Humalog aspart insulin in his insulin pump. He still takes lisinopril, 7.5 mg/day and atorvastatin, 20 mg/day fairly reliably.   F. He feels pretty tired and worn out. His fatigue has increased again due to his illnesses and to "being out of shape". His diet has not been good. He has not  been exercising since December.   G. He will get the results of his sleep study later this week.   4. Pertinent Review of Systems: Constitutional: The patient feels "out of shape and kind of bad, congested, low energy, and tired".  Eyes: Vision is good. There are no significant eye complaints. Last exam was about 8 months ago. There were no signs of diabetic eye disease.    Neck: He has had some pain in the area of the right anterior superior cervical node, but no other complaints of anterior neck swelling, soreness, tenderness, pressure, discomfort, or difficulty swallowing.  Heart: Heart rate increases with exercise or other physical activity. The patient has no complaints of palpitations, irregular heat beats, chest pain, or chest pressure. Gastrointestinal: Lactose intolerance as above. The patient has no other complaints of excessive hunger, heartburn, acid reflux, upset stomach, stomach aches or pains, or constipation. Legs: Muscle mass and strength seem normal. There are no complaints of numbness, tingling, burning, or pain. No edema is noted. Feet: There are no obvious foot problems. There are no complaints of numbness, tingling, burning, or  pain. No edema is noted. GU: No problems with libido or performance. Hypoglycemia: He has about 1 episode every other week, usually associated with being active or going too long between meals.                            5. BG printout:  He changes his sites every 3-4 days. He checks BGs anywhere from 1-8 times per day. Sometimes he goes for up to 24 hours without checking BGs. He boluses from 2-6 times per day. Some of his boluses are food boluses, not correction boluses or meal boluses. He says that he sometimes checks more often than he inputs BG values, but he is doing better. He sometimes reduces his insulin boluses to prevent low BGs. His average BG was 264, compared with 243 at his last visit. BG range was 59- >400. Two of his three BGs >400 occurred after going 24 hours between BG checks and boluses. When his sites are working well and he is checking BGs and giving meal boluses as requested, BGs vary from 145-230. The lack of physical activity that  he has had is clearly causing his BGs to be more elevated.    PAST MEDICAL, FAMILY, AND SOCIAL HISTORY:  Past Medical History  Diagnosis Date  . Type 1 diabetes mellitus not at goal [facility])   . Goiter   . Obesity   . Hypercholesterolemia   . ADHD (attention deficit hyperactivity disorder)   . Fatigue   . Tachycardia   . Autonomic neuropathy due to diabetes (HCC)   . Hypertension   . Uncontrolled DM with microalbuminuria or microproteinuria   . Hypoglycemia associated with diabetes (HCC)     Family History  Problem Relation Age of Onset  . Diabetes Maternal Grandmother     T2 DM  . Cancer Maternal Grandfather   . Thyroid disease Neg Hx      Current outpatient prescriptions:  .  atorvastatin (LIPITOR) 20 MG tablet, TAKE 1 TABLET EVERY DAY., Disp: 30 tablet, Rfl: 0 .  glucose blood (ONE TOUCH ULTRA TEST) test strip, Check glucose 6x daily, Disp: 200 each, Rfl: 6 .  insulin lispro (HUMALOG) 100 UNIT/ML injection, 300 units in insulin  pump every 48-72 hours per Hyperglycemia and DKA protocols, Disp: 40 mL, Rfl: 6 .  lisinopril (PRINIVIL,ZESTRIL) 5 MG tablet, Take 1.5 of the 5 mg tablets every day., Disp: 135 tablet, Rfl: 5  Allergies as of 01/07/2016  . (No Known Allergies)    1. 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 He hopes to be working in [place] soon.  2. Activities: He has not been physically active at work or when off-duty.    3. Smoking, alcohol, or drugs: Occasional beer, about one per month. No tobacco or drugs. 4. Primary Care Provider: None  REVIEW OF SYSTEMS: There are no other significant problems involving Godric's other body systems.   Objective:  Vital Signs:  BP 124/86 mmHg  Pulse 95  Wt 243 lb (110.224 kg)   Ht Readings from Last 3 Encounters:  11/02/13 6' (1.829 m)   Wt Readings from Last 3 Encounters:  01/07/16 243 lb (110.224 kg)  06/29/15 220 lb (99.791 kg)  12/28/14 205 lb (92.987 kg)   PHYSICAL EXAM:  Constitutional: The patient appears obese, but otherwise healthy. He has gained 23 pounds in 6 months, 38 pounds in the past year. He looks heavier and tired. Face: The face appears normal.  Eyes: There is no obvious arcus or proptosis. Moisture appears normal. His last diabetes eye exam was about 8 months ago. There were no signs of diabetic eye disease.  Mouth: The oropharynx and tongue appear normal. Oral moisture is normal. Neck: The neck appears to be visibly normal. No carotid bruits are noted. His right anterior cervical node is slightly enlarged and is tender. His strap muscles are fairly large, so it is somewhat difficult to assess his thyroid gland size. The thyroid gland is probably again about 23 grams in size. The right lobe is enlarged today, but the left lobe has shrunk down to just slightly enlarged. The consistency of the right lobe is firmer today, while the consistency of the left lobe is normal. The thyroid gland is  not tender to palpation.  Lungs: The lungs are clear to auscultation. Air movement is good.  Heart: Heart rate and rhythm are regular. Heart sounds S1 and S2 are normal. I did not appreciate any pathologic cardiac murmurs. Abdomen: The abdomen is more enlarged. Bowel sounds are normal. There is no obvious hepatomegaly, splenomegaly, or other  mass effect.  Arms: Muscle size and bulk are normal for age.  Hands: There is no tremor of his hands today. Phalangeal and metacarpophalangeal joints are normal. Palmar muscles are normal. Palmar skin is normal. Palmar moisture is also normal. Legs: Muscles appear normal for age. No edema is present. Feet: Feet are normally formed. Dorsalis pedal pulses are normal 1+ bilaterally.  Neurologic: Strength is normal for age in both the upper and lower extremities. Muscle tone is normal. Sensation to touch is normal in both the legs and feet.    LAB DATA:   Labs 01/07/16: HbA1c 10.5%  Labs 06/29/15. HbA1c 9.6%  Labs 12/28/14: Hemoglobin A1c 9.5% today, compared with 9.5%, at last visit and with 9.9% at the prior visit.  Labs 08/03/14: HbA1c 9.5%; TSH 0.548,free T4 1.10, free T3 3.7, TPO antibody < 1, TSI 30; CMP normal except glucose 191; urinary microalbumin/creatinine ratio 27; cholesterol 260, triglycerides 90, HDL 51, LDL 191   Labs 10/05/13: HbA1c 9.9%  Labs 04/23/13: CMP normal, except glucose 144; cholesterol 216, triglycerides 97, HDL 39, LDL 158; urinary microalbumin/creatinine ratio 6.9; TSH 0.505, free T4 1.17, free T3 3.8  Labs: 01/21/12: TSH 1.251, free T4 1.10, free T3 3.6, CBC normal, CMP normal except for glucose 206, urinary microalbumin/creatinine ratio 6.3, cholesterol 187, triglycerides 67, HDL 45, LDL 129   Assessment and Plan:   ASSESSMENT:  1. T1DM: His HbA1c is higher, c/w his being sick twice, taking steroids, being much less physically active, and being more insulin resistant in parallel with his weight gain and worsening obesity.  He  is not having as many BGs >400. He is doing a better job of checking BGs and taking boluses, but needs to be consistent every day. While he is inactive he will need higher basal rates.  2. Hypoglycemia: He has had one documented low BG this month of 59 following a midnight meal and bolus. He has had three BGs in the 70s in the evening, one of which was are-check, when he ate later than usual.    3. Hypertension: BP is higher today, c/w less exercise. He needs to take meds regularly and to exercise regularly.  4. Hypercholesterolemia: His lipids need to be re-checked. He absolutely needs to remain on his atorvastatin in order to prevent development of ASCVD.  5. Autonomic neuropathy and tachycardia: These problems improved when his BGs were better several visits ago. Unfortunately, these problems worsened when his BGs increased. These problems are completely reversible if he get BGs under control. 6. Obesity: Weight has increased again.  7-8. Goiter/thyroiditis: His thyroid gland is about the same size today, but his right lobe is much larger and his left lobe is much smaller, c/w intermittent flare ups of thyroiditis.  He was euthyroid in March 2013, but was borderline hyperthyroid in June 2014. He was euthyroid in October 2015. We need to re-assess his TFTs.   9. Fatigue: This problem has worsened. He may be hypothyroid today. [name]   PLAN:  1. Diagnostic:  Repeat HbA1c today.  Annual surveillance labs this week, plus LH,  FSH, testosterone.  Call me next Wednesday evening to discuss BGs.  2. Therapeutic: Follow DM care plan. Continue lisinopril dose of 7.5 mg/day. Consider Epipen.  Increase basal rates by about 10%: MN: 0.950 -> 1.00 5 AM: 1.300 -> 1.45 1 PM: 1.10 -> 1.25 3. Patient education: Discussed slow progression of long-term complications, especially when HTN and hyperlipidemia are co-morbidities. Walk for at least 15 minutes every day,  but gradually increase to 30 minutes per day as  tolerated..  4. Follow-up:  1 month  Level of Service: This visit lasted in excess of 55 minutes. More than 50% of the visit was devoted to counseling.  David Stall, MD 01/07/2016 10:35 AM

## 2016-01-07 NOTE — Patient Instructions (Addendum)
Follow up visit in one month. Please call Dr.  Fransico MichaelBrennan on Wednesday evening, 01/16/16, between 8:00-9:30 PM.

## 2016-01-08 LAB — FOLLICLE STIMULATING HORMONE: FSH: 2 m[IU]/mL (ref 1.6–8.0)

## 2016-01-08 LAB — LIPID PANEL
Cholesterol: 262 mg/dL — ABNORMAL HIGH (ref 125–200)
HDL: 40 mg/dL (ref >=40)
LDL Cholesterol: 188 mg/dL — ABNORMAL HIGH (ref <130)
TRIGLYCERIDES: 171 mg/dL — AB (ref <150)
Total CHOL/HDL Ratio: 6.6 Ratio — ABNORMAL HIGH (ref <=5.0)
VLDL: 34 mg/dL — ABNORMAL HIGH (ref <30)

## 2016-01-08 LAB — T4, FREE: FREE T4: 1.3 ng/dL (ref 0.8–1.8)

## 2016-01-08 LAB — COMPREHENSIVE METABOLIC PANEL
ALBUMIN: 4.2 g/dL (ref 3.6–5.1)
ALT: 21 U/L (ref 9–46)
AST: 15 U/L (ref 10–40)
Alkaline Phosphatase: 74 U/L (ref 40–115)
BILIRUBIN TOTAL: 0.5 mg/dL (ref 0.2–1.2)
BUN: 15 mg/dL (ref 7–25)
CO2: 28 mmol/L (ref 20–31)
CREATININE: 0.74 mg/dL (ref 0.60–1.35)
Calcium: 9.3 mg/dL (ref 8.6–10.3)
Chloride: 99 mmol/L (ref 98–110)
Glucose, Bld: 181 mg/dL — ABNORMAL HIGH (ref 70–99)
Potassium: 4.1 mmol/L (ref 3.5–5.3)
SODIUM: 138 mmol/L (ref 135–146)
TOTAL PROTEIN: 6.8 g/dL (ref 6.1–8.1)

## 2016-01-08 LAB — CBC
HEMATOCRIT: 50 % (ref 39.0–52.0)
HEMOGLOBIN: 17.2 g/dL — AB (ref 13.0–17.0)
MCH: 29.6 pg (ref 26.0–34.0)
MCHC: 34.4 g/dL (ref 30.0–36.0)
MCV: 86.1 fL (ref 78.0–100.0)
MPV: 11.2 fL (ref 8.6–12.4)
Platelets: 259 10*3/uL (ref 150–400)
RBC: 5.81 MIL/uL (ref 4.22–5.81)
RDW: 13.1 % (ref 11.5–15.5)
WBC: 6.7 10*3/uL (ref 4.0–10.5)

## 2016-01-08 LAB — MICROALBUMIN / CREATININE URINE RATIO
Creatinine, Urine: 415 mg/dL — ABNORMAL HIGH (ref 20–370)
Microalb Creat Ratio: 31 mcg/mg creat — ABNORMAL HIGH (ref <30)
Microalb, Ur: 12.9 mg/dL

## 2016-01-08 LAB — T3, FREE: T3, Free: 3.7 pg/mL (ref 2.3–4.2)

## 2016-01-08 LAB — TSH: TSH: 1.53 m[IU]/L (ref 0.40–4.50)

## 2016-01-08 LAB — LUTEINIZING HORMONE: LH: 3.9 m[IU]/mL (ref 1.5–9.3)

## 2016-01-17 ENCOUNTER — Telehealth: Payer: Self-pay | Admitting: "Endocrinology

## 2016-01-17 NOTE — Telephone Encounter (Signed)
Received telephone call from [name] 1. Overall status: Since increasing his basal rates at his last visit and resuming walking in the past week, his BGs are lower.  2. New problems: None 3. Last site change: yesterday 4. Rapid-acting insulin: Humalog in his Medtronic pump 5. BG log: 2 AM, Breakfast, Lunch, Supper, Bedtime When I asked him to review his BGs or me, he tried to do so, but said that his pump is missing many of the BGs he has done. Because he uses several different meters, he does not have the BG data available.  6. His recent lab results were as follows: Testosterone pending; LH and FSH low-normal; CBC normal, CMP normal except for glucose of 181, elevated total cholesterol and LDL cholesterol, mildly elevated triglycerides; urine microalbumin/creatinine ratio 31; TFTs normal 6. Assessment:   A. We need to see BG data to know how to adjust his insulin pump settings further.  B. Urine protein is mildly elevated, c/w poorly controled T1DM.  C. Lipids are elevated, c/w poorly controled T1DM and obesity. 7. Plan: He will record all of his BG data for the next three days. 8. FU call: Monday evening [name] J

## 2016-01-20 ENCOUNTER — Other Ambulatory Visit: Payer: Self-pay | Admitting: "Endocrinology

## 2016-01-21 ENCOUNTER — Telehealth: Payer: Self-pay | Admitting: "Endocrinology

## 2016-01-21 NOTE — Telephone Encounter (Signed)
Received telephone call from [name] 1. Overall status: DM care is going better. BGs have been pretty good. The new basal rates have helped. He is trying to be more careful with his carb intake more.  2. New problems: None 3. Last site change: 01/19/16 4. Rapid-acting insulin: Humalog in his Medtronic 530G pump 5. BG log: 2 AM, Breakfast, Lunch, Supper, Bedtime 01/19/16: xxx, 193, 213, 290, xxx 01/20/16: xxx, xxx, 170/light lunch/walk/70/10 oz soda/309/CB, 115, xxx 01/21/16: xxx, 272, 231, 192, pending 6. Assessment: BGs are much more stable overall, but are higher postprandially after higher carb meals. He still tends to over-treat low BGs.  7. Plan: Increase the ICRs for now: MN: 8 -> 7 8. FU on 02/11/16 David StallBRENNAN,MICHAEL J

## 2016-02-05 ENCOUNTER — Other Ambulatory Visit: Payer: Self-pay | Admitting: "Endocrinology

## 2016-02-07 ENCOUNTER — Encounter (INDEPENDENT_AMBULATORY_CARE_PROVIDER_SITE_OTHER): Payer: Self-pay

## 2016-02-07 ENCOUNTER — Ambulatory Visit (INDEPENDENT_AMBULATORY_CARE_PROVIDER_SITE_OTHER): Payer: 59 | Admitting: Sports Medicine

## 2016-02-07 VITALS — BP 120/80 | Ht 72.0 in | Wt 240.0 lb

## 2016-02-07 DIAGNOSIS — M7741 Metatarsalgia, right foot: Secondary | ICD-10-CM

## 2016-02-07 NOTE — Assessment & Plan Note (Signed)
Use MT pad in all shoes on RT  Today with sports insole and MT pad he had less pain and could walk comfortably  Cont this and I will reck prn

## 2016-02-07 NOTE — Progress Notes (Signed)
  [name] - 35 y.o. male MRN [id number]  Date of birth: 11/23/1980  SUBJECTIVE:  Including CC & ROS.  No chief complaint on file. [name] is a 35 yo M with history of Type 1 Diabetes who presents to sports medicine clinic for 2 week history of right foot pain and swelling. Patient reports that he vaguely remembers kicking something with the right foot one evening 2 weeks ago. When he woke up in the morning, he had point tenderness on the sole of his foot under the second metatarsal. His foot has become intermittently swollen and he elevates and ices when this happens. The pain is worst at the end of the day after he has been walking around a lot. Patient reports history of ankle fracture causing poor circulation in the right foot. Left foot has been fine without any issues.  ROS: R foot pain and swelling Denies sensory change in foot No hx of ulcers on foot    HISTORY: Past Medical, Surgical, Social, and Family History Reviewed & Updated per EMR.   Pertinent Historical Findings include:  PMH: Type 1 DM, hypercholesterolemia  Medications: Insulin, lisinopril, atorvastatin   Allergies: NKDA  Social Hx: Works in [employer] with his father   DATA REVIEWED: Prior clinic notes  PHYSICAL EXAM:  VS: BP:120/80 mmHg  HT:6' (182.9 cm)   WT:240 lb (108.863 kg)  BMI:32.6 PHYSICAL EXAM: General: well-appearing, in no acute distress MSK: R foot demonstrates transverse arch collapse, mild flattening of long arch Tenderness to palpation of distal second metatarsal L foot also with transverse arch collapse < R foot Bilateral ankles are grossly normal with full range of motion and no tenderness  ASSESSMENT & PLAN: See problem based charting & AVS for pt instructions. 35 yo M with 2 week history of point tenderness on R foot that is most consistent with metatarslagia related to transverse arch collapse. Insoles with R metatarsal pad were placed and [name] noted improvement in his  symptoms while walking with these in.   1. Metatarsalgia of right foot - Patient placed with insoles containing R metatarsal pad to relieve pressure.  - Try these for 2 weeks, return to clinic if no improvement.

## 2016-02-11 ENCOUNTER — Encounter: Payer: Self-pay | Admitting: "Endocrinology

## 2016-02-11 ENCOUNTER — Encounter: Payer: Self-pay | Admitting: *Deleted

## 2016-02-11 ENCOUNTER — Ambulatory Visit (INDEPENDENT_AMBULATORY_CARE_PROVIDER_SITE_OTHER): Payer: 59 | Admitting: "Endocrinology

## 2016-02-11 VITALS — BP 119/78 | HR 95 | Wt 244.2 lb

## 2016-02-11 DIAGNOSIS — E782 Mixed hyperlipidemia: Secondary | ICD-10-CM

## 2016-02-11 DIAGNOSIS — G4733 Obstructive sleep apnea (adult) (pediatric): Secondary | ICD-10-CM

## 2016-02-11 DIAGNOSIS — E10649 Type 1 diabetes mellitus with hypoglycemia without coma: Secondary | ICD-10-CM

## 2016-02-11 DIAGNOSIS — E049 Nontoxic goiter, unspecified: Secondary | ICD-10-CM

## 2016-02-11 DIAGNOSIS — E1043 Type 1 diabetes mellitus with diabetic autonomic (poly)neuropathy: Secondary | ICD-10-CM

## 2016-02-11 DIAGNOSIS — R000 Tachycardia, unspecified: Secondary | ICD-10-CM

## 2016-02-11 DIAGNOSIS — E109 Type 1 diabetes mellitus without complications: Secondary | ICD-10-CM | POA: Diagnosis not present

## 2016-02-11 DIAGNOSIS — IMO0001 Reserved for inherently not codable concepts without codable children: Secondary | ICD-10-CM

## 2016-02-11 DIAGNOSIS — E1065 Type 1 diabetes mellitus with hyperglycemia: Principal | ICD-10-CM

## 2016-02-11 DIAGNOSIS — I10 Essential (primary) hypertension: Secondary | ICD-10-CM

## 2016-02-11 LAB — GLUCOSE, POCT (MANUAL RESULT ENTRY): POC GLUCOSE: 138 mg/dL — AB (ref 70–99)

## 2016-02-11 LAB — POCT GLYCOSYLATED HEMOGLOBIN (HGB A1C): Hemoglobin A1C: 9.2

## 2016-02-11 MED ORDER — ATORVASTATIN CALCIUM 20 MG PO TABS: ORAL_TABLET | ORAL | Status: DC

## 2016-02-11 MED ORDER — LISINOPRIL 5 MG PO TABS: ORAL_TABLET | ORAL | Status: DC

## 2016-02-11 NOTE — Patient Instructions (Addendum)
Follow up visit in one month. Increase both atorvasttin and lisinopril to 1.5 tablets per day.

## 2016-02-11 NOTE — Progress Notes (Signed)
Subjective:  Patient Name: [name] Date of Birth: 05/10/1981  MRN: [id number]  Joseph ProntoGabriel Christen  presents to the office today for follow-up of his type 1 diabetes mellitus, goiter, obesity, combined hyperlipidemia, adult ADHD, fatigue, autonomic neuropathy, tachycardia, hypertension, microalbuminuria, and hypoglycemia.  HISTORY OF PRESENT ILLNESS:   [name] is a 35 y.o. Caucasian young man. [name] was unaccompanied.  1. The patient was first referred to me on 01/08/2006 by his family physician, Dr. Roanna EpleyBert Fields, for evaluation and management of type 1 diabetes mellitus and related problems. Patient was 35 years old.  A. The patient had been diagnosed with type 1 diabetes somewhere in 2001-2002, at the age of 34-20. He was initially diagnosed with type 2 diabetes mellitus, but was later re-classified as type 1 diabetes mellitus. He had been followed by Dr. Arther Damesom O'Connell, [facility]in[facility]. Patient was started on an insulin pump approximately 18 months prior. His pump was a Medtronic Paradigm 712. His blood glucose control was fair-to-poor. He was frequently not checking blood sugars or taking insulin boluses as often as he needed to. He frequently noted fast heart rate. The patient's past medical history was positive for hypertension, for which he was taking lisinopril, and for what his mother called "extreme ADHD". He had previously been taking ADHD medicines but had discontinued them. He had prior ankle injuries and knee injuries, to include tears of the lateral menisci bilaterally. He had not had any surgeries. He was working for a [employer] company that had many different jobs sites in different parts of the [place] and in other countries as well. As a result, the patient was on the road a lot. He did not use tobacco or drugs, but did drink alcohol occasionally.  B. On physical examination, his weight was 234 pounds, his height was 70 inches, his BMI was 33.2. Blood pressure was 128/70.  Hemoglobin A1c was 9.5%. Heart rate was 96. He had normal affect and fair insight. He had a 25-30 gram goiter. He had normal 1+ DP pulses in his feet. He had normal sensation in his feet to touch, vibration, and monofilament. His CMP was normal except for glucose of 251. His cholesterol was 257, triglycerides 143, HDL 49, and LDL 179. TSH was 1.017, free T4 was 1.06, and free T3 was 3.3. His urinary microalbumin: creatinine ratio was 20.1 (normal less than 30).   C. The patient clearly needed better blood glucose control, which would only occur if he had better adherence to his plan. He appeared to have autonomic neuropathy, manifested by inappropriate sinus tachycardia.  His thyroid goiter suggested that he might have evolving Hashimoto's disease. He stated that he had lost some weight recently. I encouraged him to check his blood sugars more frequently and to take both correction boluses and food boluses.  2. During the past ten years, the patient's blood glucose control has occasionally been better, but frequently been worse. His hemoglobin A1c values have varied from 8.3-11.2%. In the last year, however, his A1c's have varied from 9.5-10.5%. Throughout the 10 year period, the patient's autonomic neuropathy and tachycardia have remained essentially the same. On 12/03/10 his total cholesterol was 270, triglycerides 94, HDL 50, and LDL 201. I started him on Crestor, 10 mg per day. However at the time of his visit on 03/03/11, the total cholesterol was 244, triglycerides 102, HDL 40, and LDL 184. Unfortunately, he had stopped taking Crestor prior to those lab tests.   3. The patient's last [facility] visit was on  01/07/16.  A.  In the interim he had been healthy. His pneumonia resolved. He has started to work out again, but is bothered by some foot problems. He saw Dr. Roanna Epley in sports medicine on 02/08/16. Dr. Darrick Penna prescribed orthotics. [name] is also trying to cut down on carbs.   B. He continues to have problems  with lactose intolerance. When he avoids lactose, however, he is asymptomatic. Some foods such as shellfish, now cause his throat to swell up. He has not yet seen an allergist. I again suggested Dr. Colonel Bald or Dr. Laurette Schimke.     C. He has his Enlite sensor, but has not been using it. He had been getting rashes from the former adhesive, so he stopped using the sensor. [employer] was supposed to send him some new adhesive, but did not.  D. The left shoulder is pretty much back to normal.  He no longer has many back problems.   E. He is using Humalog aspart insulin in his insulin pump. He still takes lisinopril, but only 5 mg/day instead of the 7.5 mg per day that I had asked him to take. He also takes atorvastatin, 20 mg/day. He takes both medications fairly reliably.   F. 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 He feels much more energetic and much less fatigued.  G. The results of his sleep study showed "mild OSA". He did not like the staff at the sleep center in [facility], so he will schedule an appointment with another center.   4. Pertinent Review of Systems: Constitutional: The patient feels "much better, but still out of shape".  Eyes: Vision is good. There are no significant eye complaints. Last exam was about 9 months ago. There were no signs of diabetic eye disease.    Neck: He has had no complaints of anterior neck swelling, soreness, tenderness, pressure, discomfort, or difficulty swallowing.  Heart: Heart rate increases with exercise or other physical activity. The patient has no complaints of palpitations, irregular heat beats, chest pain, or chest pressure. Gastrointestinal: Lactose intolerance as above. The patient has no other complaints of excessive hunger, heartburn, acid reflux, upset stomach, stomach aches or pains, or constipation. Legs: Muscle mass and strength seem normal. There are no complaints of numbness, tingling, burning, or pain. No edema is  noted. Feet: As above. There are no other obvious foot problems. There are no complaints of numbness, tingling, burning, or pain. No edema is noted. GU: No problems with libido or performance. Hypoglycemia: He has about 1-2 episode every other week, usually associated with being unusually active or going too long between meals.         5. BG printout:  He changes his sites every 3-4 days, mostly every three days. He checks BGs anywhere from 1-4 times per day. Unfortunately, he often uses multiple meters, some of which he did not bring with him today. He often misses BG checks at breakfast and at bedtime. He says that he sometimes checks BGs more often than he inputs BG values, but he is doing better. Sometimes he goes for up to 52 hours without checking BGs and taking correction boluses. He boluses from 2-5 times per day. Many of his boluses are food boluses, not correction boluses or meal boluses. He sometimes reduces his insulin boluses to prevent low BGs. His average BG was 260, compared with 264 at his last visit. BG range was 58-400, compared with 59- >400 at his last visit.  When his sites are working well and he is checking BGs and giving meal boluses as requested, BGs vary from 125-165.    PAST MEDICAL, FAMILY, AND SOCIAL HISTORY:  Past Medical History  Diagnosis Date  . Type 1 diabetes mellitus not at goal [facility])   . Goiter   . Obesity   . Hypercholesterolemia   . ADHD (attention deficit hyperactivity disorder)   . Fatigue   . Tachycardia   . Autonomic neuropathy due to diabetes (HCC)   . Hypertension   . Uncontrolled DM with microalbuminuria or microproteinuria   . Hypoglycemia associated with diabetes (HCC)     Family History  Problem Relation Age of Onset  . Diabetes Maternal Grandmother     T2 DM  . Cancer Maternal Grandfather   . Thyroid disease Neg Hx      Current outpatient prescriptions:  .  atorvastatin (LIPITOR) 20 MG tablet, TAKE 1 TABLET EVERY DAY., Disp: 30  tablet, Rfl: 0 .  insulin lispro (HUMALOG) 100 UNIT/ML injection, 300 units in insulin pump every 48-72 hours per Hyperglycemia and DKA protocols, Disp: 40 mL, Rfl: 6 .  lisinopril (PRINIVIL,ZESTRIL) 5 MG tablet, Take 1.5 of the 5 mg tablets every day., Disp: 135 tablet, Rfl: 5 .  ONE TOUCH ULTRA TEST test strip, TEST BLOOD SUGAR 6 TIMES DAILY, Disp: 200 each, Rfl: 6 .  azithromycin (ZITHROMAX) 250 MG tablet, Reported on 02/11/2016, Disp: , Rfl: 0 .  VENTOLIN HFA 108 (90 Base) MCG/ACT inhaler, Reported on 02/11/2016, Disp: , Rfl: 0  Allergies as of 02/11/2016  . (No Known Allergies)    1. 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 He hopes to be working in [place] soon.  2. Activities: He has resumed working out recently.     3. Smoking, alcohol, or drugs: Occasional beer, about one per month. No tobacco or drugs. 4. Primary Care Provider: None  REVIEW OF SYSTEMS: There are no other significant problems involving Rashawd's other body systems.   Objective:  Vital Signs:  BP 119/78 mmHg  Pulse 95  Wt 244 lb 3.2 oz (110.768 kg)   Ht Readings from Last 3 Encounters:  02/07/16 6' (1.829 m)  11/02/13 6' (1.829 m)   Wt Readings from Last 3 Encounters:  02/11/16 244 lb 3.2 oz (110.768 kg)  02/07/16 240 lb (108.863 kg)  01/07/16 243 lb (110.224 kg)   PHYSICAL EXAM:  Constitutional: The patient appears obese, but otherwise healthy. He has gained 1 pound in 1 month, much of which is probably muscle.  Face: The face appears normal.  Eyes: There is no obvious arcus or proptosis. Moisture appears normal. Mouth: The oropharynx and tongue appear normal. Oral moisture is normal. Neck: The neck appears to be visibly normal. No carotid bruits are noted. His strap muscles are fairly large, so it is somewhat difficult to assess his thyroid gland size. The thyroid gland is probably smaller at about 21-22 grams in size. Bot lobes are smaller today. The  consistency of the thyroid gland is still relatively firm. The thyroid gland is not tender to palpation.  Lungs: The lungs are clear to auscultation. Air movement is good.  Heart: Heart rate and rhythm are regular. Heart sounds S1 and S2 are normal. I did not appreciate any pathologic cardiac murmurs. Abdomen: The abdomen is enlarged. Bowel sounds are normal. There is no obvious hepatomegaly, splenomegaly, or other mass effect.  Arms: Muscle size and bulk are normal  for age.  Hands: There is no tremor of his hands today. Phalangeal and metacarpophalangeal joints are normal. Palmar muscles are normal. Palmar skin is normal. Palmar moisture is also normal. Legs: Muscles appear normal for age. No edema is present. Neurologic: Strength is normal for age in both the upper and lower extremities. Muscle tone is normal. Sensation to touch is normal in both legs.   LAB DATA:   Labs 02/11/16: HbA1c 9.2%  Labs 01/07/16: HbA1c 10.5%; CBC with Hgb elevated at 17.2%; LH 3.9, FSH 2.0; TSH 1.53, free T4 1.3, free T3 3.7; cholesterol 262, triglycerides 171, HDL 40, LDL 188; urinary microalbumin/creatinine ratio 31; CMP normal except for glucose 181  Labs 06/29/15. HbA1c 9.6%  Labs 12/28/14: Hemoglobin A1c 9.5% today, compared with 9.5%, at last visit and with 9.9% at the prior visit.  Labs 08/03/14: HbA1c 9.5%; TSH 0.548,free T4 1.10, free T3 3.7, TPO antibody < 1, TSI 30; CMP normal except glucose 191; urinary microalbumin/creatinine ratio 27; cholesterol 260, triglycerides 90, HDL 51, LDL 191   Labs 10/05/13: HbA1c 9.9%  Labs 04/23/13: CMP normal, except glucose 144; cholesterol 216, triglycerides 97, HDL 39, LDL 158; urinary microalbumin/creatinine ratio 6.9; TSH 0.505, free T4 1.17, free T3 3.8  Labs: 01/21/12: TSH 1.251, free T4 1.10, free T3 3.6, CBC normal, CMP normal except for glucose 206, urinary microalbumin/creatinine ratio 6.3, cholesterol 187, triglycerides 67, HDL 45, LDL 129   Assessment and  Plan:   ASSESSMENT:  1. T1DM: His HbA1c is much lower after a month of exercising more and trying to reduce his carb intake. He is trying to take better care of his T1DM, but still finds it very difficult to adhere to a daily schedule that is required to take good care of his T1DM. 2. Hypoglycemia: He has had two documented low BGs this month of 58 and 60.     3. Hypertension: BP is lower today, c/w more exercise. He needs to take meds regularly and to exercise regularly. He also needs to increase his lisinopril to 7.5 mg/day. 4. Combined hyperlipidemia: His lipids are still too high. Eating right, exercising, losing fat weight, and taking his atorvastatin regularly will help. He also needs a higher dose of atorvastatin now.  5. Autonomic neuropathy and tachycardia: These problems improved when his BGs were better several visits ago. Unfortunately, these problems worsened when his BGs increased. These problems are completely reversible if he get BGs under control. 6. Obesity: Weight has increased again, but only slightly.  7-8. Goiter/thyroiditis: His thyroid gland is probably smaller today, c/w less intermittent thyroiditis.  He was euthyroid in March 2013, but was borderline hyperthyroid in June 2014. He was euthyroid in October 2015 and again in march 2017.   9. Fatigue: This problem has improved. Unfortunately, he still has OSA.  10. Obstructive sleep apnea: This problem will improve with weight loss, but he may still need a mouth piece or CPAP. He does not want to take the time at present to schedule an appointment with another sleep center.   PLAN:  1. Diagnostic:  Repeat HbA1c today.   Call me next Wednesday evening to discuss BGs. Call on the last Sunday in April to discuss BGs.  2. Therapeutic: Follow DM care plan. Increase atorvastatin to 30 mg/day. Increase lisinopril dose to 7.5 mg/day. Consider Epipen.  Continue current basal rates: MN: 1.00 5 AM: 1.45 1 PM: 1.25 Add 0.5 units of  Humalog at dinner. 3. Patient education: Discussed slow progression of long-term complications,  especially when HTN and hyperlipidemia are co-morbidities. Walk for at least 15 minutes every day, but gradually increase to 30-60 minutes per day as tolerated. 4. Follow-up:  1 month  Level of Service: This visit lasted in excess of 55 minutes. More than 50% of the visit was devoted to counseling.  David Stall, MD 02/11/2016 9:38 AM

## 2016-03-31 ENCOUNTER — Ambulatory Visit: Payer: 59 | Admitting: "Endocrinology

## 2016-06-13 ENCOUNTER — Ambulatory Visit (INDEPENDENT_AMBULATORY_CARE_PROVIDER_SITE_OTHER): Payer: 59 | Admitting: "Endocrinology

## 2016-06-13 ENCOUNTER — Encounter: Payer: Self-pay | Admitting: "Endocrinology

## 2016-06-13 VITALS — BP 116/80 | HR 99 | Wt 250.2 lb

## 2016-06-13 DIAGNOSIS — I10 Essential (primary) hypertension: Secondary | ICD-10-CM

## 2016-06-13 DIAGNOSIS — E063 Autoimmune thyroiditis: Secondary | ICD-10-CM

## 2016-06-13 DIAGNOSIS — E1043 Type 1 diabetes mellitus with diabetic autonomic (poly)neuropathy: Secondary | ICD-10-CM

## 2016-06-13 DIAGNOSIS — R000 Tachycardia, unspecified: Secondary | ICD-10-CM | POA: Diagnosis not present

## 2016-06-13 DIAGNOSIS — E10649 Type 1 diabetes mellitus with hypoglycemia without coma: Secondary | ICD-10-CM | POA: Diagnosis not present

## 2016-06-13 DIAGNOSIS — R5383 Other fatigue: Secondary | ICD-10-CM

## 2016-06-13 DIAGNOSIS — E109 Type 1 diabetes mellitus without complications: Secondary | ICD-10-CM

## 2016-06-13 DIAGNOSIS — E1065 Type 1 diabetes mellitus with hyperglycemia: Principal | ICD-10-CM

## 2016-06-13 DIAGNOSIS — E049 Nontoxic goiter, unspecified: Secondary | ICD-10-CM

## 2016-06-13 DIAGNOSIS — E782 Mixed hyperlipidemia: Secondary | ICD-10-CM

## 2016-06-13 DIAGNOSIS — G4733 Obstructive sleep apnea (adult) (pediatric): Secondary | ICD-10-CM

## 2016-06-13 DIAGNOSIS — IMO0001 Reserved for inherently not codable concepts without codable children: Secondary | ICD-10-CM

## 2016-06-13 LAB — GLUCOSE, POCT (MANUAL RESULT ENTRY): POC Glucose: 200 mg/dl — AB (ref 70–99)

## 2016-06-13 LAB — POCT GLYCOSYLATED HEMOGLOBIN (HGB A1C): Hemoglobin A1C: 9.8

## 2016-06-13 NOTE — Progress Notes (Signed)
Subjective:  Patient Name: [name] Date of Birth: [birth year]  MRN: [id number]  [name]  presents to the office today for follow-up of his type 1 diabetes mellitus, goiter, obesity, combined hyperlipidemia, adult ADHD, fatigue, autonomic neuropathy, tachycardia, hypertension, microalbuminuria, and hypoglycemia.  HISTORY OF PRESENT ILLNESS:   [name] is a 35 y.o. Caucasian young man. [name] was unaccompanied.  1. The patient was first referred to me on 01/08/2006 by his family physician, Dr. Roanna Epley, for evaluation and management of type 1 diabetes mellitus and related problems. Patient was 35 years old.  A. The patient had been diagnosed with type 1 diabetes somewhere in 2001-2002, at the age of 33-19. He was initially diagnosed with type 2 diabetes mellitus, but was later re-classified as type 1 diabetes mellitus. He had been followed by Dr. Arther Dames, staff endocrinologist at [facility]. Patient was started on an insulin pump approximately 18 months prior. His pump was a Medtronic Paradigm 712. His blood glucose control was fair-to-poor. He was frequently not checking blood sugars or taking insulin boluses as often as he needed to. He frequently noted fast heart rate. The patient's past medical history was positive for hypertension, for which he was taking lisinopril, and for what his mother called "extreme ADHD". He had previously been taking ADHD medicines but had discontinued them. He had prior ankle injuries and knee injuries, to include tears of the lateral menisci bilaterally. He had not had any surgeries. He was working for a [employer] that had many different jobs sites in different parts of the [place] and in other countries as well. As a result, the patient was on the road a lot. He did not use tobacco or drugs, but did drink alcohol occasionally.  B. On physical examination, his weight was 234 pounds, his height was 70 inches, his BMI was 33.2. Blood pressure was 128/70.  Hemoglobin A1c was 9.5%. Heart rate was 96. He had normal affect and fair insight. He had a 25-30 gram goiter. He had normal 1+ DP pulses in his feet. He had normal sensation in his feet to touch, vibration, and monofilament. His CMP was normal except for glucose of 251. His cholesterol was 257, triglycerides 143, HDL 49, and LDL 179. TSH was 1.017, free T4 was 1.06, and free T3 was 3.3. His urinary microalbumin: creatinine ratio was 20.1 (normal less than 30).   C. The patient clearly needed better blood glucose control, which would only occur if he had better adherence to his plan. He appeared to have autonomic neuropathy, manifested by inappropriate sinus tachycardia.  His thyroid goiter suggested that he might have evolving Hashimoto's disease. He stated that he had lost some weight recently. I encouraged him to check his blood sugars more frequently and to take both correction boluses and food boluses.  2. During the past ten years, the patient's blood glucose control has occasionally been better, but frequently been worse. His hemoglobin A1c values have varied from 8.3-11.2%. In the last year, however, his A1c's have varied from 9.5-10.5%. Throughout the 10 year period, the patient's autonomic neuropathy and tachycardia have remained essentially the same. On 12/03/10 his total cholesterol was 270, triglycerides 94, HDL 50, and LDL 201. I started him on Crestor, 10 mg per day. However at the time of his visit on 03/03/11, the total cholesterol was 244, triglycerides 102, HDL 40, and LDL 184. Unfortunately, he had stopped taking Crestor prior to those lab tests.   3. The patient's last [facility] visit was on  02/11/16.  A.  In the interim he had been healthy. He had a pinched nerve in the back of his neck about two weeks ago. The pains gradually resolved.   B. He continues to have problems with lactose intolerance. When he avoids lactose, however, he is asymptomatic. Some foods such as shellfish, now cause his  throat to swell up. He has not yet seen an allergist. I again suggested  Dr. Laurette Schimke.     C. He has not been using his Enlite sensor. He had been getting rashes from the former adhesive, so he stopped using the sensor. [employer] was supposed to send him some new adhesive, but did not. He never followed up.  D. The left shoulder is pretty much back to normal.  He no longer has many back problems.   E. He is using Humalog aspart insulin in his insulin pump. He still takes lisinopril at a dose of 7.5 mg per day. He also takes atorvastatin, 20 mg/day. He takes both medications fairly reliably.   F. 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 He has more energy. Despite the fact that his day-today schedule is now much more stable, he is not using the available time that he now has to the maximum advantage when it comes to exercise and to taking care of his diabetes.   G. The results of his sleep study showed "mild OSA". He did not like the staff at the sleep center in [facility], so he has scheduled an appointment with another center in 1-2 months.    4. Pertinent Review of Systems: Constitutional: The patient feels "better, but out of shape".  Eyes: Vision is good. There are no significant eye complaints. Last exam was about 13 months ago. There were no signs of diabetic eye disease.    Neck: He has had no complaints of anterior neck swelling, soreness, tenderness, pressure, discomfort, or difficulty swallowing.  Heart: Heart rate increases with exercise or other physical activity. The patient has no complaints of palpitations, irregular heat beats, chest pain, or chest pressure. Gastrointestinal: Lactose intolerance as above. The patient has no other complaints of excessive hunger, heartburn, acid reflux, upset stomach, stomach aches or pains, or constipation. Legs: Muscle mass and strength seem normal. There are no complaints of numbness, tingling, burning, or pain. No edema is  noted. Feet: His foot pains resolved after Dr. Darrick Penna prescribed orthotics for him. There are no obvious foot problems now. There are no complaints of numbness, tingling, burning, or pain. No edema is noted. GU: No problems with libido or performance. Hypoglycemia: He has about 1 episode per week, usually associated with being unusually active or going too long between meals.         5. BG printout:  He changes his sites every 1-4 days, mostly every 3-4 days. He checks BGs anywhere from 1-5 times per day, but may go for up to 24 hours without BG checks. [name] Unfortunately, he often uses multiple meters, some of which he did not bring with him today. He often misses BG checks at dinner. If he does not check BGs he may do only a food bolus or may often not give a bolus at all. He boluses from 2-6 times per day. Many of his boluses are food boluses, not correction boluses or meal boluses. He sometimes reduces his insulin boluses to prevent low BGs. His average BG was 279, compared with 260 at his last visit. BG range was  64 to >400, compared with 58-400 at last meal. When his sites are working well and he is checking BGs and giving meal boluses as requested, BGs vary from 158-240. He had one low BG of 64 when he exercised after work and had a delayed dinner meal. Despite the fact that I've asked him to use his temporary basal rate function when he exercises, he does not.      PAST MEDICAL, FAMILY, AND SOCIAL HISTORY:  Past Medical History:  Diagnosis Date  . ADHD (attention deficit hyperactivity disorder)   . Autonomic neuropathy due to diabetes (HCC)   . Fatigue   . Goiter   . Hypercholesterolemia   . Hypertension   . Hypoglycemia associated with diabetes (HCC)   . Obesity   . Tachycardia   . Type 1 diabetes mellitus not at goal [facility])   . Uncontrolled DM with microalbuminuria or microproteinuria     Family History  Problem Relation Age of Onset  . Diabetes Maternal Grandmother     T2 DM  .  Cancer Maternal Grandfather   . Thyroid disease Neg Hx      Current Outpatient Prescriptions:  .  atorvastatin (LIPITOR) 20 MG tablet, Take 1.5 tablets daily., Disp: 45 tablet, Rfl: 6 .  insulin lispro (HUMALOG) 100 UNIT/ML injection, 300 units in insulin pump every 48-72 hours per Hyperglycemia and DKA protocols, Disp: 40 mL, Rfl: 6 .  lisinopril (PRINIVIL,ZESTRIL) 5 MG tablet, Take 1.5 of the 5 mg tablets every day., Disp: 135 tablet, Rfl: 3 .  ONE TOUCH ULTRA TEST test strip, TEST BLOOD SUGAR 6 TIMES DAILY, Disp: 200 each, Rfl: 6 .  azithromycin (ZITHROMAX) 250 MG tablet, Reported on 02/11/2016, Disp: , Rfl: 0 .  VENTOLIN HFA 108 (90 Base) MCG/ACT inhaler, Reported on 02/11/2016, Disp: , Rfl: 0  Allergies as of 06/13/2016  . (No Known Allergies)    1. 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 His hours are more controllable.  2. Activities: He walks some and occasionally lifts weight, but "not too much".      3. Smoking, alcohol, or drugs: Occasional beer, about one-two per month. No tobacco or drugs. 4. Primary Care Provider: None  REVIEW OF SYSTEMS: There are no other significant problems involving Excell's other body systems.   Objective:  Vital Signs:  BP 116/80   Pulse 99   Wt 250 lb 3.6 oz (113.5 kg)   BMI 33.94 kg/m    Ht Readings from Last 3 Encounters:  02/07/16 6' (1.829 m)  11/02/13 6' (1.829 m)   Wt Readings from Last 3 Encounters:  06/13/16 250 lb 3.6 oz (113.5 kg)  02/11/16 244 lb 3.2 oz (110.8 kg)  02/07/16 240 lb (108.9 kg)   PHYSICAL EXAM:  Constitutional: The patient appears more obese, but otherwise healthy. He has gained 6 pounds in 4 months, all of which is probably fat.  His affect and insight are normal.  Face: The face appears normal.  Eyes: There is no obvious arcus or proptosis. Moisture appears normal. Mouth: The oropharynx and tongue appear normal. Oral moisture is normal. Neck: The neck appears to be visibly normal. No carotid  bruits are noted. His strap muscles are fairly large, so it is somewhat difficult to assess his thyroid gland size. The thyroid gland is low lying and still mildly enlarged at about 21-22 grams in size. Both lobes are fairly similar in size today, with the right lobe a bit larger. The consistency of  the thyroid gland is softer. The thyroid gland is not tender to palpation.  Lungs: The lungs are clear to auscultation. Air movement is good.  Heart: Heart rate and rhythm are regular. Heart sounds S1 and S2 are normal. I did not appreciate any pathologic cardiac murmurs. Abdomen: The abdomen is enlarged. Bowel sounds are normal. There is no obvious hepatomegaly, splenomegaly, or other mass effect.  Arms: Muscle size and bulk are normal for age.  Hands: There is no tremor of his hands today. Phalangeal and metacarpophalangeal joints are normal. Palmar muscles are normal. Palmar skin is normal. Palmar moisture is also normal. Legs: Muscles appear normal for age. No edema is present. Feet: Faint 1+ DP pulse on the right and 1+ on the left.  Neurologic: Strength is normal for age in both the upper and lower extremities. Muscle tone is normal. Sensation to touch is normal in both legs and feet.   LAB DATA:   Labs 06/13/16: HbA1c 9.8%  Labs 02/11/16: HbA1c 9.2%  Labs 01/07/16: HbA1c 10.5%; CBC with Hgb elevated at 17.2%; LH 3.9, FSH 2.0; TSH 1.53, free T4 1.3, free T3 3.7; cholesterol 262, triglycerides 171, HDL 40, LDL 188; urinary microalbumin/creatinine ratio 31; CMP normal except for glucose 181  Labs 06/29/15. HbA1c 9.6%  Labs 12/28/14: Hemoglobin A1c 9.5% today, compared with 9.5%, at last visit and with 9.9% at the prior visit.  Labs 08/03/14: HbA1c 9.5%; TSH 0.548,free T4 1.10, free T3 3.7, TPO antibody < 1, TSI 30; CMP normal except glucose 191; urinary microalbumin/creatinine ratio 27; cholesterol 260, triglycerides 90, HDL 51, LDL 191   Labs 10/05/13: HbA1c 9.9%  Labs 04/23/13: CMP normal,  except glucose 144; cholesterol 216, triglycerides 97, HDL 39, LDL 158; urinary microalbumin/creatinine ratio 6.9; TSH 0.505, free T4 1.17, free T3 3.8  Labs: 01/21/12: TSH 1.251, free T4 1.10, free T3 3.6, CBC normal, CMP normal except for glucose 206, urinary microalbumin/creatinine ratio 6.3, cholesterol 187, triglycerides 67, HDL 45, LDL 129   Assessment and Plan:   ASSESSMENT:  1. T1DM: His HbA1c is higher, in part due to having fewer low BGs. He has also reduced his exercise level, but has not reduced his carb intake. He is still trying to take better care of his T1DM, but still finds it very difficult to adhere to a daily schedule that is required to take good care of his T1DM. 2. Hypoglycemia: He has had one documented low BG this month of 64.     3. Hypertension: BP is higher today, c/w less exercise. He needs to take meds regularly and to exercise regularly. He commits to doing so. 4. Combined hyperlipidemia: His lipids in March 2017 were still too high. Eating right, exercising, losing fat weight, and taking his atorvastatin regularly will help. 5. Autonomic neuropathy and tachycardia: These problems worsened somewhat in parallel with the increase in HbA1c. These problems are completely reversible if he gets his BGs under control. 6. Obesity: Weight has increased more, c/w more imbalance between calories ingested and calories burned off.   7-8. Goiter/thyroiditis:   A. His thyroid gland is about the same size today, but is softer. This finding indicates that he hs relatively less thyroid inflammation now.   B. He was euthyroid in March 2013, but was borderline hyperthyroid in June 2014. He was euthyroid in October 2015 and again in March 2017.   9. Fatigue: This problem has improved. Unfortunately, he still has OSA that needs to be treated.  10. Obstructive sleep apnea: This  problem will improve with weight loss, but he may still need a mouth piece or CPAP. He does not want to take the  time at present to schedule an appointment with another sleep center.   PLAN:  1. Diagnostic:  Repeat HbA1c today.   Check LH, FSH, and testosterone today.  Call me next Wednesday evening to discuss BGs.  2. Therapeutic: Follow DM care plan. Continue atorvastatin dose of 30 mg/day. Continue lisinopril dose of 7.5 mg/day. Consider Epipen if he has symptoms of angioedema.  Increase  basal rates: MN: 1.00 -> 1.10 5 AM: 1.45 -> 1.55 1 PM: 1.25 -> 1.40 3. Patient education: Discussed slow progression of long-term complications, especially when HTN and hyperlipidemia are co-morbidities. Walk for at least 45 minutes every day, but gradually increase to 60 minutes per day as tolerated. 4. Follow-up:  2 months  Level of Service: This visit lasted in excess of 55 minutes. More than 50% of the visit was devoted to counseling.  David Stall, MD 06/13/2016 10:02 AM

## 2016-06-13 NOTE — Patient Instructions (Signed)
Follow up visit in 2 months. Please call dr. Fransico Jalia Zuniga next Wednesday evening between 8:00-9:30 PM.

## 2016-06-14 LAB — FOLLICLE STIMULATING HORMONE: FSH: 2.3 m[IU]/mL (ref 1.6–8.0)

## 2016-06-14 LAB — LUTEINIZING HORMONE: LH: 3.6 m[IU]/mL (ref 1.5–9.3)

## 2016-06-16 LAB — TESTOSTERONE TOTAL,FREE,BIO, MALES
ALBUMIN: 4.4 g/dL (ref 3.6–5.1)
SEX HORMONE BINDING: 42 nmol/L (ref 10–50)
TESTOSTERONE BIOAVAILABLE: 119.5 ng/dL — AB (ref 130.5–681.7)
Testosterone, Free: 59.4 pg/mL (ref 47.0–244.0)
Testosterone: 533 ng/dL (ref 250–827)

## 2016-06-18 ENCOUNTER — Telehealth: Payer: Self-pay | Admitting: "Endocrinology

## 2016-06-18 NOTE — Telephone Encounter (Signed)
Received telephone call from [name] 1. Overall status: Things are good. BGs are lower since increasing his basal rates last week, but has also had two low BGs..  2. New problems: None 3. Last site change: tonight 4. Rapid-acting insulin: Humalog 5. BG log: 2 AM, Breakfast, Lunch, Supper, Bedtime 06/16/16: xxx, 217, 86, 145/96/105/95, 120/180 - He was a little more active that day, especially in the late afternoon. He did not use a TBR. 06/17/16: xxx, 165, 252, 263, xxx 06/18/16: xxx, 302, 308, site change/232/288, pending 6. Assessment: BGs increased as his site was increasing less successful. 7. Plan: Continue current pump settings. Use TBR of 50-80% when he plans to be active.  8. FU call: next Wednesday evening [name] J

## 2016-06-25 ENCOUNTER — Telehealth: Payer: Self-pay | Admitting: "Endocrinology

## 2016-06-25 NOTE — Telephone Encounter (Signed)
[facility]  1. Overall status: BGs are better during the week and lower on the weekends when he is more active. 2. New problems: None 3. Last site change: yesterday morning 4. Rapid-acting insulin: Humalog in his Medtronic 530G pump 5. BG log: 2 AM, Breakfast, Lunch, Supper, Bedtime 06/23/16: xxx, 231, 272, 204, xxx 06/24/16: 191, 320, 396/site change/123, 101/103, 255 06/25/16: 206, ?220, 179, pending 6. Assessment: When his sites are working well and he is more compliant with BG checks and boluses, BGs are much better. He can use some increase in his basal rate.  7. Plan:   A. New Standard Pattern for week days basal rates in Basal Rate 1: MN: 1.10 -> 1.20 5:30 AM: 1.55 -> 1.70 1:30 PM: 1.40 -> 1.50  B. New Basal Pattern B for weekends/holidays:  MN: 1.10 8:00 AM: 1.45 1:30 PM: 1.30 8. FU call: Sunday evening [name]

## 2016-06-29 ENCOUNTER — Telehealth: Payer: Self-pay | Admitting: "Endocrinology

## 2016-06-29 NOTE — Telephone Encounter (Signed)
Received telephone call from [name] 1. Overall status: He is doing good. 2. New problems: The battery on his meter died this morning. He is now using a different meter.  3. Last site change: 20 minutes ago, and 2 days prior 4. Rapid-acting insulin:Humalolg in his Medtronic 530G insulin pump 5. BG log: 2 AM, Breakfast, Lunch, Supper, Bedtime 06/29/16: xxx, 164, 211/79, 127, pending - He had an unusually high carb breakfast this morning. He was active after lunch. 6. Assessment: He had some variable BGs today. 7. Plan: Continue his current pump settings. 8. FU call: Wednesday 07/09/16 [name],[name]

## 2016-07-13 ENCOUNTER — Other Ambulatory Visit: Payer: Self-pay | Admitting: "Endocrinology

## 2016-07-13 DIAGNOSIS — I10 Essential (primary) hypertension: Secondary | ICD-10-CM

## 2016-08-13 ENCOUNTER — Encounter: Payer: Self-pay | Admitting: Allergy and Immunology

## 2016-08-13 ENCOUNTER — Ambulatory Visit (INDEPENDENT_AMBULATORY_CARE_PROVIDER_SITE_OTHER): Payer: 59 | Admitting: Allergy and Immunology

## 2016-08-13 ENCOUNTER — Encounter (INDEPENDENT_AMBULATORY_CARE_PROVIDER_SITE_OTHER): Payer: Self-pay

## 2016-08-13 VITALS — BP 130/70 | HR 80 | Temp 98.4°F | Resp 18 | Ht 70.2 in | Wt 248.0 lb

## 2016-08-13 DIAGNOSIS — Z79899 Other long term (current) drug therapy: Secondary | ICD-10-CM

## 2016-08-13 DIAGNOSIS — T783XXA Angioneurotic edema, initial encounter: Secondary | ICD-10-CM | POA: Diagnosis not present

## 2016-08-13 DIAGNOSIS — H1013 Acute atopic conjunctivitis, bilateral: Secondary | ICD-10-CM | POA: Diagnosis not present

## 2016-08-13 DIAGNOSIS — Z91018 Allergy to other foods: Secondary | ICD-10-CM

## 2016-08-13 MED ORDER — FAMOTIDINE 20 MG PO TABS: 20.0000 mg | ORAL_TABLET | Freq: Every day | ORAL | 5 refills | Status: DC

## 2016-08-13 MED ORDER — LOSARTAN POTASSIUM 50 MG PO TABS: 50.0000 mg | ORAL_TABLET | Freq: Every day | ORAL | 2 refills | Status: DC

## 2016-08-13 MED ORDER — AUVI-Q 0.3 MG/0.3ML IJ SOAJ: INTRAMUSCULAR | 2 refills | Status: DC

## 2016-08-13 NOTE — Patient Instructions (Addendum)
  1. Allergen avoidance measures?  2. Stop lisinopril and use losartan 50 mg tablet 1 time per day. Check blood pressure  3. Auvi-Q, Benadryl, M.D./ER evaluation for allergic reaction  4. Prevention:   A. loratadine 10 mg one tablet one time per day  B. famotidine 20 mg one tablet one time per day  5. Investigation: CBC w/diff, CMP, celiac screen w/ IgA, shellfish panel, Carmen red, alpha gal panel, urease breath test (before famotidine use)  6. Further evaluation?  7. Return to clinic in 3 weeks or earlier if problem  8. Obtain fall flu vaccine

## 2016-08-13 NOTE — Progress Notes (Signed)
NEW PATIENT NOTE  Referring Provider: No ref. provider found Primary Provider: No PCP Per Patient Date of office visit: 08/13/2016    Subjective:   Chief Complaint:  [name] (DOB: 10-10-1981) is a 35 y.o. male who presents to the clinic on 08/13/2016 with a chief complaint of Allergic Rhinitis  and Other (Food allergies) .  HPI: [name] presents to this clinic in evaluation of several issues.  First, recently he had a reaction with throat tightness and feeling as though he had slight swallowing difficulty after eating grilled muscles approximately 2 months ago, without any other associated systemic or constitutional symptoms. As well, he has had the sensation of very deep throat swelling at the back of his tongue that has occurred multiple times over the course of the past 6 months without any obvious trigger but on 2 occasions associated with the consumption of starburst minis and on another occasion with a [place] pepperoni and on one other occasion with a [place] jerky.  Second, he gets the sensation of a "head cold" for about 60 minutes after eating any meal over the course of the past 6 months. He develops a sensation of pressure behind his ears and maybe a low-grade bitemporal headache and some ear pressure but no significant nasal airway symptoms and no sneezing and no defect in his ability to smell or taste. There is no obvious provoking factor giving rise to this issue other than the fact that he believes it is the act of eating.  Third, he's had gastrointestinal upset for the past 6 months. He's been having cramping and gas without a tremendous amount of reflux and without any diarrhea. He does have a history of dairy-induced diarrhea over the course of the past 6 years and he avoids dairy at this point in time.  [name], he does get significant eye itching if he is around cats but has no other associated systemic or constitutional symptoms and notes no other obvious  trigger other than cats that gives rise to this issue.  Past Medical History:  Diagnosis Date  . Diabetes (HCC)   . High blood pressure   . High cholesterol     Past Surgical History:  Procedure Laterality Date  . WISDOM TOOTH EXTRACTION        Medication List      atorvastatin 20 MG tablet Commonly known as:  LIPITOR Take 20 mg by mouth daily.   insulin lispro 100 UNIT/ML injection Commonly known as:  HUMALOG Inject into the skin.   lisinopril 5 MG tablet Commonly known as:  PRINIVIL,ZESTRIL Take 5 mg by mouth daily.       No Known Allergies  Review of systems negative except as noted in HPI / PMHx or noted below:  Review of Systems  Constitutional: Negative.   HENT: Negative.   Eyes: Negative.   Respiratory: Negative.   Cardiovascular: Negative.   Gastrointestinal: Negative.   Genitourinary: Negative.   Musculoskeletal: Negative.   Skin: Negative.   Neurological: Negative.   Endo/Heme/Allergies: Negative.   Psychiatric/Behavioral: Negative.     Family History  Problem Relation Age of Onset  . Dementia Paternal Aunt   . Schizophrenia Paternal Aunt   . Cancer Maternal Grandmother     Social History   Social History  . Marital status: Single    Spouse name: N/A  . Number of children: N/A  . Years of education: N/A   Occupational History  . Not on file.  Social History Main Topics  . Smoking status: Never Smoker  . Smokeless tobacco: Never Used  . Alcohol use Not on file  . Drug use: Unknown  . Sexual activity: Not on file   Other Topics Concern  . Not on file   Social History Narrative  . No narrative on file    Environmental and Social history  Lives in a house with a dry environment, no animals located inside the household, hardwoods in the bedroom, no plastic on the bed or pillow, and no smoking ongoing with inside the household. 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  Objective:    Vitals:   08/13/16 0846  BP: 130/70  Pulse: 80  Resp: 18  Temp: 98.4 F (36.9 C)   Height: 5' 10.2" (178.3 cm) Weight: 248 lb (112.5 kg)  Physical Exam  Constitutional: He is well-developed, well-nourished, and in no distress.  HENT:  Head: Normocephalic.  Right Ear: Tympanic membrane, external ear and ear canal normal.  Left Ear: Tympanic membrane, external ear and ear canal normal.  Nose: Nose normal. No mucosal edema or rhinorrhea.  Mouth/Throat: Uvula is midline, oropharynx is clear and moist and mucous membranes are normal. No oropharyngeal exudate.  Eyes: Conjunctivae are normal.  Neck: Trachea normal. No tracheal tenderness present. No tracheal deviation present. No thyromegaly present.  Cardiovascular: Normal rate, regular rhythm, S1 normal, S2 normal and normal heart sounds.   No murmur heard. Pulmonary/Chest: Breath sounds normal. No stridor. No respiratory distress. He has no wheezes. He has no rales.  Musculoskeletal: He exhibits no edema.  Lymphadenopathy:       Head (right side): No tonsillar adenopathy present.       Head (left side): No tonsillar adenopathy present.    He has no cervical adenopathy.  Neurological: He is alert. Gait normal.  Skin: No rash noted. He is not diaphoretic. No erythema. Nails show no clubbing.  Psychiatric: Mood and affect normal.    Diagnostics: Allergy skin tests were performed. He did not demonstrate any hypersensitivity against a screening panel of foods.  Assessment and Plan:    1. Angioedema, initial encounter   2. On angiotensin-converting enzyme (ACE) inhibitors   3. Food allergy   4. Conjunctivitis, atopic, bilateral     1. Allergen avoidance measures?  2. Stop lisinopril and use losartan 50 mg tablet 1 time per day. Check blood pressure  3. Auvi-Q, Benadryl, M.D./ER evaluation for allergic reaction  4. Prevention:   A. loratadine 10 mg one tablet one time per day  B. famotidine 20 mg one tablet one time per  day  5. Investigation: CBC w/diff, CMP, celiac screen w/ IgA, shellfish panel, Carmen red, alpha gal panel, urease breath test (before famotidine use)  6. Further evaluation?  7. Return to clinic in 3 weeks or earlier if problem  8. Obtain fall flu vaccine  [name] appears to have some form of angioedema ongoing within his oral cavity mostly located at the base of his tongue and with his use of an ACE inhibitor I think we need to eliminate this medication given the possibility that he is developing an adverse response to the administration of this medication. I will give him losartan to try and he'll need to check his blood pressure to make sure that this is maintaining good control of this issue. He very well could also had a food allergy especially based upon his history of developing a significant reaction with consumption of muscles  and he also has this very strange syndrome of eating and developing problems with a pressure phenomenon within his head. And as well he has been having significant gastrointestinal upset since that same point in time in which he has developed this postprandial headache issue. I am going to have him obtain blood tests and a urease breath test as noted above in investigation of adverse responses to foods and to rule out the possibility of Helicobacter pylori infection and well as a screen of his major organ function. As well, until we get a little bit more information I would like for him to consistently use an H1 and H2 receptor blocker to prevent the development of any allergic reaction. His allergic conjunctivitis really does not require any therapy at this point in time  Laurette SchimkeEric Kozlow, MD [facility]

## 2016-08-14 ENCOUNTER — Encounter: Payer: Self-pay | Admitting: "Endocrinology

## 2016-08-15 ENCOUNTER — Ambulatory Visit (INDEPENDENT_AMBULATORY_CARE_PROVIDER_SITE_OTHER): Payer: Self-pay | Admitting: "Endocrinology

## 2016-08-16 LAB — H. PYLORI BREATH TEST: H. PYLORI UBIT: NEGATIVE

## 2016-08-19 LAB — CBC WITH DIFFERENTIAL/PLATELET
BASOS: 1 %
Basophils Absolute: 0.1 10*3/uL (ref 0.0–0.2)
EOS (ABSOLUTE): 0.1 10*3/uL (ref 0.0–0.4)
EOS: 2 %
HEMATOCRIT: 47.8 % (ref 37.5–51.0)
HEMOGLOBIN: 16.8 g/dL (ref 12.6–17.7)
IMMATURE GRANS (ABS): 0 10*3/uL (ref 0.0–0.1)
IMMATURE GRANULOCYTES: 0 %
Lymphocytes Absolute: 1.5 10*3/uL (ref 0.7–3.1)
Lymphs: 25 %
MCH: 30.4 pg (ref 26.6–33.0)
MCHC: 35.1 g/dL (ref 31.5–35.7)
MCV: 87 fL (ref 79–97)
MONOCYTES: 8 %
Monocytes Absolute: 0.5 10*3/uL (ref 0.1–0.9)
NEUTROS ABS: 3.7 10*3/uL (ref 1.4–7.0)
Neutrophils: 64 %
PLATELETS: 260 10*3/uL (ref 150–379)
RBC: 5.52 x10E6/uL (ref 4.14–5.80)
RDW: 12.8 % (ref 12.3–15.4)
WBC: 5.7 10*3/uL (ref 3.4–10.8)

## 2016-08-19 LAB — CELIAC DISEASE PANEL
Endomysial IgA: NEGATIVE
IGA/IMMUNOGLOBULIN A, SERUM: 129 mg/dL (ref 90–386)
Transglutaminase IgA: <2 U/mL (ref 0–3)

## 2016-08-19 LAB — COMPREHENSIVE METABOLIC PANEL
A/G RATIO: 1.8 (ref 1.2–2.2)
ALBUMIN: 4.4 g/dL (ref 3.5–5.5)
ALT: 30 IU/L (ref 0–44)
AST: 17 IU/L (ref 0–40)
Alkaline Phosphatase: 88 IU/L (ref 39–117)
BILIRUBIN TOTAL: 0.3 mg/dL (ref 0.0–1.2)
BUN / CREAT RATIO: 16 (ref 9–20)
BUN: 14 mg/dL (ref 6–20)
CALCIUM: 9.7 mg/dL (ref 8.7–10.2)
CO2: 25 mmol/L (ref 18–29)
Chloride: 99 mmol/L (ref 96–106)
Creatinine, Ser: 0.86 mg/dL (ref 0.76–1.27)
GFR, EST AFRICAN AMERICAN: 131 mL/min/{1.73_m2} (ref >59)
GFR, EST NON AFRICAN AMERICAN: 113 mL/min/{1.73_m2} (ref >59)
GLOBULIN, TOTAL: 2.5 g/dL (ref 1.5–4.5)
Glucose: 207 mg/dL — ABNORMAL HIGH (ref 65–99)
POTASSIUM: 4.6 mmol/L (ref 3.5–5.2)
Sodium: 139 mmol/L (ref 134–144)
TOTAL PROTEIN: 6.9 g/dL (ref 6.0–8.5)

## 2016-08-19 LAB — ALLERGEN PROFILE, SHELLFISH
Clam IgE: <0.1 kU/L
Scallop IgE: <0.1 kU/L
Shrimp IgE: <0.1 kU/L

## 2016-08-19 LAB — ALPHA-GAL PANEL
Class Interpretation: 0
Class Interpretation: 0
Class Interpretation: 0
Lamb/Mutton (Ovis spp) IgE: <0.1 kU/L (ref <0.35)
Pork (Sus spp) IgE: <0.1 kU/L (ref <0.35)

## 2016-08-19 LAB — F340-IGE CARMINE RED DYE

## 2016-08-26 ENCOUNTER — Other Ambulatory Visit: Payer: Self-pay | Admitting: "Endocrinology

## 2016-08-26 DIAGNOSIS — IMO0001 Reserved for inherently not codable concepts without codable children: Secondary | ICD-10-CM

## 2016-08-26 DIAGNOSIS — E1065 Type 1 diabetes mellitus with hyperglycemia: Principal | ICD-10-CM

## 2016-09-03 ENCOUNTER — Ambulatory Visit (INDEPENDENT_AMBULATORY_CARE_PROVIDER_SITE_OTHER): Payer: 59 | Admitting: Allergy and Immunology

## 2016-09-03 ENCOUNTER — Encounter: Payer: Self-pay | Admitting: Allergy and Immunology

## 2016-09-03 VITALS — BP 124/78 | HR 100 | Resp 18

## 2016-09-03 DIAGNOSIS — H1013 Acute atopic conjunctivitis, bilateral: Secondary | ICD-10-CM

## 2016-09-03 DIAGNOSIS — Z91018 Allergy to other foods: Secondary | ICD-10-CM

## 2016-09-03 DIAGNOSIS — Z23 Encounter for immunization: Secondary | ICD-10-CM | POA: Diagnosis not present

## 2016-09-03 DIAGNOSIS — T783XXD Angioneurotic edema, subsequent encounter: Secondary | ICD-10-CM | POA: Diagnosis not present

## 2016-09-03 NOTE — Patient Instructions (Signed)
  1. Remain away from ACE inhibitors  2. Auvi-Q, Benadryl, M.D./ER evaluation for allergic reaction  3. Attempt to discontinue Loratadine and famotadine  4. Flu vaccine administered in the clinic today  5. Contact clinic if problems in the future.

## 2016-09-03 NOTE — Progress Notes (Signed)
Follow-up Note  Referring Provider: No ref. provider found Primary Provider: No PCP Per Patient Date of Office Visit: 09/03/2016  Subjective:   [name] (DOB: 1981/05/17) is a 35 y.o. male who [facility] in re-evaluation of the following:  HPI: [name] returns to this clinic in reevaluation of his allergic reactions and apparent angioedema and food allergy and atopic conjunctivitis. I last saw him in his clinic during his initial evaluation of 08/13/2016 at which time we removed his lisinopril. He continues on an H1 and H2 receptor blocker.  He is resolved all his issues. He no longer has the throat tightness sensation and he no longer has the sensation that his head is full and no longer has any gastrointestinal upset.    Medication List      atorvastatin 20 MG tablet Commonly known as:  LIPITOR Take 1.5 tablets daily.   AUVI-Q 0.3 mg/0.3 mL Soaj injection Generic drug:  EPINEPHrine Use as directed for life-threatening allergic reaction.   famotidine 20 MG tablet Commonly known as:  PEPCID Take 1 tablet (20 mg total) by mouth daily.   insulin lispro 100 UNIT/ML injection Commonly known as:  HUMALOG 300 units in insulin pump every 48-72 hours per Hyperglycemia and DKA protocols   HUMALOG 100 UNIT/ML injection Generic drug:  insulin lispro USE 300 UNITS IN INSULIN PUMP EVERY 48 TO 72 HOURS PER HYPERGLYCEMIA AND DKA PROTOCOLS   loratadine 10 MG tablet Commonly known as:  CLARITIN Take 10 mg by mouth daily.   losartan 50 MG tablet Commonly known as:  COZAAR Take 1 tablet (50 mg total) by mouth daily.   ONE TOUCH ULTRA TEST test strip Generic drug:  glucose blood TEST BLOOD SUGAR 6 TIMES DAILY   VENTOLIN HFA 108 (90 Base) MCG/ACT inhaler Generic drug:  albuterol Reported on 02/11/2016       Past Medical History:  Diagnosis Date  . ADHD (attention deficit hyperactivity disorder)   . Autonomic neuropathy  due to diabetes (HCC)   . Diabetes (HCC)   . Fatigue   . Goiter   . High blood pressure   . High cholesterol   . Hypercholesterolemia   . Hypertension   . Hypoglycemia associated with diabetes (HCC)   . Obesity   . Tachycardia   . Type 1 diabetes mellitus not at goal [facility])   . Uncontrolled DM with microalbuminuria or microproteinuria     Past Surgical History:  Procedure Laterality Date  . none    . WISDOM TOOTH EXTRACTION      No Known Allergies  Review of systems negative except as noted in HPI / PMHx or noted below:  Review of Systems  Constitutional: Negative.   HENT: Negative.   Eyes: Negative.   Respiratory: Negative.   Cardiovascular: Negative.   Gastrointestinal: Negative.   Genitourinary: Negative.   Musculoskeletal: Negative.   Skin: Negative.   Neurological: Negative.   Endo/Heme/Allergies: Negative.   Psychiatric/Behavioral: Negative.      Objective:   Vitals:   09/03/16 1046  BP: 124/78  Pulse: 100  Resp: 18          Physical Exam  Constitutional: He is well-developed, well-nourished, and in no distress.  HENT:  Head: Normocephalic.  Right Ear: Tympanic membrane, external ear and ear canal normal.  Left Ear: Tympanic membrane, external ear and ear canal normal.  Nose: Nose normal. No mucosal edema or rhinorrhea.  Mouth/Throat: Uvula is midline, oropharynx  is clear and moist and mucous membranes are normal. No oropharyngeal exudate.  Eyes: Conjunctivae are normal.  Neck: Trachea normal. No tracheal tenderness present. No tracheal deviation present. No thyromegaly present.  Cardiovascular: Normal rate, regular rhythm, S1 normal, S2 normal and normal heart sounds.   No murmur heard. Pulmonary/Chest: Breath sounds normal. No stridor. No respiratory distress. He has no wheezes. He has no rales.  Musculoskeletal: He exhibits no edema.  Lymphadenopathy:       Head (right side): No tonsillar adenopathy present.       Head (left side): No  tonsillar adenopathy present.    He has no cervical adenopathy.  Neurological: He is alert. Gait normal.  Skin: No rash noted. He is not diaphoretic. No erythema. Nails show no clubbing.  Psychiatric: Mood and affect normal.    Diagnostics: Results of blood tests obtained on 08/13/2016 identified normal hepatic and renal function, white blood cell count 5.7 with a normal differential, hemoglobin 16.8 with a platelet count of 260. He did not demonstrate any hypersensitivity against a screening panel of shellfish, negative celiac screen with an IgA level 126 mg per DL, and a negative alpha gal panel      Assessment and Plan:   1. Food allergy   2. Conjunctivitis, atopic, bilateral   3. Angioedema, subsequent encounter     1. Remain away from ACE inhibitors  2. Auvi-Q, Benadryl, M.D./ER evaluation for allergic reaction  3. Attempt to discontinue Loratadine and famotadine  4. Flu vaccine administered in the clinic today  5. Contact clinic if problems in the future.  At this point I am going to assume that [name] had an adverse response to the use of his ACE inhibitor and given the fact that all the symptoms have resolved while discontinuing this agent we will now see if he can discontinue his H1 and H2 receptor blocker. If he does well then there is no reason for him to follow-up in this clinic but certainly if he has recurrent reactions in the future he needs to contact me. He can always restart his H1 and H2 receptor blocker if needed. I still recommended that he remain away from eating muscles as there was a temporal relationship with his initial reaction and the consumption of this food product.  Laurette Schimke, MD [facility]

## 2016-09-26 ENCOUNTER — Ambulatory Visit (INDEPENDENT_AMBULATORY_CARE_PROVIDER_SITE_OTHER): Payer: Commercial Managed Care - HMO | Admitting: "Endocrinology

## 2016-09-26 ENCOUNTER — Encounter (INDEPENDENT_AMBULATORY_CARE_PROVIDER_SITE_OTHER): Payer: Self-pay | Admitting: "Endocrinology

## 2016-09-26 VITALS — BP 122/68 | HR 108 | Wt 247.8 lb

## 2016-09-26 DIAGNOSIS — E1065 Type 1 diabetes mellitus with hyperglycemia: Secondary | ICD-10-CM | POA: Diagnosis not present

## 2016-09-26 DIAGNOSIS — IMO0001 Reserved for inherently not codable concepts without codable children: Secondary | ICD-10-CM

## 2016-09-26 LAB — POCT GLYCOSYLATED HEMOGLOBIN (HGB A1C): Hemoglobin A1C: 9.5

## 2016-09-26 LAB — GLUCOSE, POCT (MANUAL RESULT ENTRY): POC Glucose: 316 mg/dl — AB (ref 70–99)

## 2016-09-26 NOTE — Progress Notes (Signed)
Subjective:  Patient Name: [name] Date of Birth: 19-Feb-1981  MRN: [id number]  [name]  presents to the office today for follow-up of his type 1 diabetes mellitus, goiter, obesity, combined hyperlipidemia, adult ADHD, fatigue, autonomic neuropathy, tachycardia, hypertension, microalbuminuria, and hypoglycemia.  HISTORY OF PRESENT ILLNESS:   [name] is a 35 y.o. Caucasian young man. [name] was unaccompanied.  1. The patient was first referred to me on 01/08/2006 by his family physician, Dr. Roanna Epley, for evaluation and management of type 1 diabetes mellitus and related problems. The patient was 35 years old.  A. [name] had been diagnosed with type 1 diabetes somewhere in 2001-2002, at the age of 40-19. He was initially diagnosed with type 2 diabetes mellitus, but was later re-classified as type 1 diabetes mellitus. He had been followed by Dr. Arther Dames, staff endocrinologist at [facility]. [name] was started on an insulin pump approximately 18 months prior to first seeing me. His pump was a Medtronic Paradigm 712. His blood glucose control was fair-to-poor. He was frequently not checking blood sugars or taking insulin boluses as often as he needed to. He frequently noted fast heart rate. The patient's past medical history was positive for hypertension, for which he was taking lisinopril, and for what his mother called "extreme ADHD". He had previously been taking ADHD medicines but had discontinued them due to adverse effects. He had prior ankle injuries and knee injuries, to include tears of the lateral menisci bilaterally. He had not had any surgeries. He was working for a [employer] that had many different jobs sites in different parts of the [place] and in other countries as well. 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 He did not use tobacco or drugs, but did drink alcohol occasionally.  B. On physical examination, his weight was 234  pounds, his height was 70 inches, his BMI was 33.2. Blood pressure was 128/70. Hemoglobin A1c was 9.5%. Heart rate was 96. He had normal affect and fair insight. He had a 25-30 gram goiter. He had normal 1+ DP pulses in his feet. He had normal sensation in his feet to touch, vibration, and monofilament. His CMP was normal except for glucose of 251. His cholesterol was 257, triglycerides 143, HDL 49, and LDL 179. TSH was 1.017, free T4 was 1.06, and free T3 was 3.3. His urinary microalbumin: creatinine ratio was 20.1 (normal less than 30).   C. The patient clearly needed better blood glucose control, which would only occur if he had better adherence to his plan. He appeared to have autonomic neuropathy, manifested by inappropriate sinus tachycardia.  His thyroid goiter suggested that he might have evolving Hashimoto's disease. He stated that he had lost some weight recently. I encouraged him to check his blood sugars more frequently and to take both correction boluses and food boluses.  2. During the past ten years, the patient's blood glucose control has occasionally been better, but frequently been worse. His hemoglobin A1c values have varied from 8.3-11.2%. In the last year, however, his A1c's have varied from 9.5-10.5%. Throughout the 10 year period, the patient's autonomic neuropathy and tachycardia have remained essentially the same. On 12/03/10 his total cholesterol was 270, triglycerides 94, HDL 50, and LDL 201. I started him on Crestor, 10 mg per day. However at the time of his visit on 03/03/11, the total cholesterol was 244, triglycerides 102, HDL 40, and LDL 184. He had stopped  taking Crestor prior to those lab tests.   3. The patient's last [facility] visit was on 06/13/16. At that visit I increased all of his basal rates and his BGs have been lower overall.   A.  In the interim he developed a URI and chest cold 2-3 weeks ago. His head cold symptoms are improving, but his chest congestion has persisted.   B.  The pinched nerve in the back of his neck resolved.    C. He continues to have problems with lactose intolerance. When he avoids lactose, however, he is asymptomatic. Some foods such as shellfish, now cause his throat to swell up. He saw Dr. Laurette SchimkeEric Kozlow who stopped the lisinopril and started losartan.   His GI symptoms improved thereafter.    D. He has not been using his Enlite sensor. He had been getting rashes from the former adhesive, so he stopped using the sensor. [employer] was supposed to send him some new adhesive, but did not. He never followed up.  E. He is using Humalog aspart insulin in his insulin pump. He takes losartan, 50 mg/day and atorvastatin, 20 mg/day. He takes both medications fairly reliably.   F. 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 Since returning to field work, however, he is getting less sleep and is working longer hours. He is also finding it much harder to check BGs and give insulin boluses regularly.  G. The results of his sleep study showed "mild OSA". He did not like the staff at the sleep center in [facility] [place], so he has scheduled an appointment with another center in 1 month.    4. Pertinent Review of Systems: Constitutional: The patient feels "not very good, a little shaky,and fatigued".   Eyes: Vision is good. There are no significant eye complaints. Last exam was about 16 months ago. There were no signs of diabetic eye disease. He has not re-scheduled a follow up exam.   Neck: He has had more complaints of anterior neck swelling and soreness in both thyroid lobes, plus pressure sensations and difficulty swallowing.  Heart: Heart rate increases with exercise or other physical activity. The patient has no complaints of palpitations, irregular heat beats, chest pain, or chest pressure. He has had more chest wall pains since he's been sick.   Gastrointestinal: Lactose intolerance as above. The patient has no other complaints of excessive hunger, heartburn, acid reflux, upset stomach, stomach aches or pains, or constipation. Legs: Muscle mass and strength seem normal. There are no complaints of numbness, tingling, burning, or pain. No edema is noted. Feet: There are no obvious foot problems now. There are no complaints of numbness, tingling, burning, or pain. No edema is noted. GU: No problems with libido or performance. Hypoglycemia: He has about 1 episode every couple of weeks, still usually associated with him being unusually active or going too long between meals.      Emotional: He would like help with his Adult ADD.    5. BG printout:  He changes his sites every 1-4 days, mostly every 3 days. He checks BGs anywhere from 1-6 times per day, but may go for up to 36 hours without BG checks. If he does not check BGs he may do only a food bolus or may often not give a bolus at all. He boluses from 2-6 times per day. Many of his boluses  are food boluses, not correction boluses or meal boluses. He sometimes reduces his insulin boluses to prevent low BGs. His average BG was 285, compared with 279 at his last visit. BG range was 62 to >400, compared with 64 to >400 at last visit. When his sites are working well and he is checking BGs and giving meal boluses as requested, BGs vary from 146-265 . He had three BGs in the 60s when he exercised or had delayed meals    PAST MEDICAL, FAMILY, AND SOCIAL HISTORY:  Past Medical History:  Diagnosis Date  . ADHD (attention deficit hyperactivity disorder)   . Autonomic neuropathy due to diabetes (HCC)   . Diabetes (HCC)   . Fatigue   . Goiter   . High blood pressure   . High cholesterol   . Hypercholesterolemia   . Hypertension   . Hypoglycemia associated with diabetes (HCC)   . Obesity   . Tachycardia   . Type 1 diabetes mellitus not at goal [facility]HCC)   . Uncontrolled DM with microalbuminuria or  microproteinuria     Family History  Problem Relation Age of Onset  . Dementia Paternal Aunt   . Schizophrenia Paternal Aunt   . Cancer Maternal Grandmother   . Diabetes Maternal Grandmother     T2 DM  . Cancer Maternal Grandfather   . Thyroid disease Neg Hx      Current Outpatient Prescriptions:  .  atorvastatin (LIPITOR) 20 MG tablet, Take 1.5 tablets daily., Disp: 45 tablet, Rfl: 6 .  AUVI-Q 0.3 MG/0.3ML SOAJ injection, Use as directed for life-threatening allergic reaction., Disp: 4 Device, Rfl: 2 .  famotidine (PEPCID) 20 MG tablet, Take 1 tablet (20 mg total) by mouth daily., Disp: 30 tablet, Rfl: 5 .  HUMALOG 100 UNIT/ML injection, USE 300 UNITS IN INSULIN PUMP EVERY 48 TO 72 HOURS PER HYPERGLYCEMIA AND DKA PROTOCOLS, Disp: 40 mL, Rfl: 0 .  insulin lispro (HUMALOG) 100 UNIT/ML injection, 300 units in insulin pump every 48-72 hours per Hyperglycemia and DKA protocols, Disp: 40 mL, Rfl: 6 .  loratadine (CLARITIN) 10 MG tablet, Take 10 mg by mouth daily., Disp: , Rfl:  .  losartan (COZAAR) 50 MG tablet, Take 1 tablet (50 mg total) by mouth daily., Disp: 30 tablet, Rfl: 2 .  ONE TOUCH ULTRA TEST test strip, TEST BLOOD SUGAR 6 TIMES DAILY, Disp: 200 each, Rfl: 6 .  VENTOLIN HFA 108 (90 Base) MCG/ACT inhaler, Reported on 02/11/2016, Disp: , Rfl: 0  Allergies as of 09/26/2016  . (No Known Allergies)    1. Work and Family: He is now back out in the field. 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  2. Activities: He walks some.     3. Smoking, alcohol, or drugs: Occasional beer, about one-two per month. No tobacco or drugs. 4. Primary Care Provider: None  REVIEW OF SYSTEMS: There are no other significant problems involving Eivin's other body systems.   Objective:  Vital Signs:  BP 122/68   Pulse (!) 108   Wt 247 lb 12.8 oz (112.4 kg)   BMI 35.36 kg/m    Ht Readings from Last 3 Encounters:  08/13/16  5' 10.2" (1.783 m)  02/07/16 6' (1.829 m)  11/02/13 6' (1.829 m)   Wt Readings from Last 3 Encounters:  09/26/16 247 lb 12.8 oz (112.4 kg)  08/13/16 248 lb (112.5 kg)  06/13/16 250 lb 3.6  oz (113.5 kg)   PHYSICAL EXAM:  Constitutional: The patient appears obese. He is sniffling and coughing occasionally. He looks moderately ill and tired today. He has lost 2 pounds in 3 months. His affect and insight are normal.  Face: The face appears normal.  Eyes: There is no obvious arcus or proptosis. Moisture appears normal. Mouth: The oropharynx and tongue appear normal. Oral moisture is normal. Neck: The neck appears to be visibly normal. No carotid bruits are noted. His strap muscles are fairly large, so it is somewhat difficult to assess his thyroid gland size. The thyroid gland is low lying and more enlarged at about 22 grams in size. The right .lobe is only mildly enlarged, has a normal consistency, and is not tender to palpation. The left lobe is larger, has a fuller consistency, and is tender in the inferior pole.  Chest: He has point tenderness to palpation to the three upper costo-chondral and chondro-manubrial joints on the left side.  Lungs: The lungs are clear to auscultation. He did have few posterior crackles in both lung fields which cleared immediately after I asked him to cough once. Air movement is good.  Heart: Heart rate and rhythm are regular. Heart sounds S1 and S2 are normal. I did not appreciate any pathologic cardiac murmurs. Abdomen: The abdomen is enlarged. Bowel sounds are normal. There is no obvious hepatomegaly, splenomegaly, or other mass effect.  Arms: Muscle size and bulk are normal for age.  Hands: There is no tremor of his hands today. Phalangeal and metacarpophalangeal joints are normal. Palmar muscles are normal. Palmar skin is normal. Palmar moisture is also normal. Legs: Muscles appear normal for age. No edema is present. Feet: 1+ DP pulse on the right and 1+ on  the left.  Neurologic: Strength is normal for age in both the upper and lower extremities. Muscle tone is normal. Sensation to touch is normal in both legs and is normal to touch, vibration, and monofilament in both feet.    LAB DATA:   Labs 09/26/16: HbA1c 9.5%  Labs 06/13/16: HbA1c 9.8%; LH 3.6, FSH 2.3, testosterone 533 (ref [id number]), free testosterone 59.4 (ref 47-244)  Labs 02/11/16: HbA1c 9.2%  Labs 01/07/16: HbA1c 10.5%; CBC with Hgb elevated at 17.2%; LH 3.9, FSH 2.0; TSH 1.53, free T4 1.3, free T3 3.7; cholesterol 262, triglycerides 171, HDL 40, LDL 188; urinary microalbumin/creatinine ratio 31; CMP normal except for glucose 181  Labs 06/29/15. HbA1c 9.6%  Labs 12/28/14: Hemoglobin A1c 9.5% today, compared with 9.5%, at last visit and with 9.9% at the prior visit.  Labs 08/03/14: HbA1c 9.5%; TSH 0.548,free T4 1.10, free T3 3.7, TPO antibody < 1, TSI 30; CMP normal except glucose 191; urinary microalbumin/creatinine ratio 27; cholesterol 260, triglycerides 90, HDL 51, LDL 191   Labs 10/05/13: HbA1c 9.9%  Labs 04/23/13: CMP normal, except glucose 144; cholesterol 216, triglycerides 97, HDL 39, LDL 158; urinary microalbumin/creatinine ratio 6.9; TSH 0.505, free T4 1.17, free T3 3.8  Labs: 01/21/12: TSH 1.251, free T4 1.10, free T3 3.6, CBC normal, CMP normal except for glucose 206, urinary microalbumin/creatinine ratio 6.3, cholesterol 187, triglycerides 67, HDL 45, LDL 129   Assessment and Plan:   ASSESSMENT:  1. 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  2. Hypoglycemia: He has had three documented low BG in the 60s this month. Two of the low BGs  occurred after a busy day and before a late dinner. One occurred late at night.  3. Hypertension: BP is good today. He needs to take meds regularly and to exercise regularly.  4. Combined hyperlipidemia: His lipids in March 2017 were  still too high. Eating right, exercising, losing fat weight, and taking his atorvastatin regularly will help. 5. Autonomic neuropathy and tachycardia: These problems fluctuate somewhat with his HbA1c levels over time. 6. Obesity: Weight has decreased a bit, probably due to his recent illness.  7-8. Goiter/thyroiditis:   A. His thyroid gland is larger and the left lobe is much larger and tender today. These findings indicates that he has more thyroiditis activity on the left side today.    B. He was euthyroid in March 2013, but was borderline hyperthyroid in June 2014. He was euthyroid in October 2015 and again in March 2017.   9. 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 He probably also has OSA that needs to be treated.  10. Obstructive sleep apnea: This problem will improve with weight loss, but he may still need a mouth piece or CPAP. He does not want to take the time at present to schedule an appointment with another sleep center.  11. Adult ADD: I gave him the website address for Dr. Piedad Climes, MD in [facility].   12. URI with mild bronchitis: He is moderately ill and needs to take three days off to rest and recuperate at home.   PLAN:  1. Diagnostic:  Repeat HbA1c today.   Reviewed lab results from his last visit.  Call in two weeks on a Sunday or Wednesday evening. 2. Therapeutic: Follow DM care plan. Continue atorvastatin and losartan. Increase basal rates: MN: 1.20 -> 1.35 6 AM: 1.70 -> 1.90 2 PM: 1.50 -> 1.60 New 8 PM: 1.65  3. Patient education: Discussed slow progression of long-term complications, especially when HTN and hyperlipidemia are co-morbidities. Walk for at least 45 minutes every day, but gradually increase to 60 minutes per day as tolerated. 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  4. Follow-up:  2 months  Level of Service: This visit lasted in  excess of 85 minutes. More than 50% of the visit was devoted to counseling.  David Stall, MD, CDE Adult and Pediatric Endocrinology  09/26/2016 11:51 AM

## 2016-09-26 NOTE — Patient Instructions (Signed)
Follow up visit in 3 months. 

## 2016-10-03 ENCOUNTER — Other Ambulatory Visit: Payer: Self-pay | Admitting: "Endocrinology

## 2016-10-03 DIAGNOSIS — IMO0001 Reserved for inherently not codable concepts without codable children: Secondary | ICD-10-CM

## 2016-10-03 DIAGNOSIS — E1065 Type 1 diabetes mellitus with hyperglycemia: Principal | ICD-10-CM

## 2016-11-17 ENCOUNTER — Telehealth (INDEPENDENT_AMBULATORY_CARE_PROVIDER_SITE_OTHER): Payer: Self-pay

## 2016-11-17 NOTE — Telephone Encounter (Signed)
Returned TC to Patient. Advised that I have not seen any paperwork on a new pump for him. I will reach out to our rep for [employer] and see if she can see the status and contact him on that. Patient ok with information given.

## 2016-11-17 NOTE — Telephone Encounter (Signed)
  Who's calling (name and relationship to patient) :Ova FreshwaterGabriel  Best contact number:[phone]  Provider they ZOX:WRUEAVWsee:Brennan  Reason for call:Patient wants to speak w/Brennan about if he is getting the right pump. It is time for his old one to be replaced. [employer] has told patient that they have sent info. To our office.     PRESCRIPTION REFILL ONLY  Name of prescription:  Pharmacy:

## 2016-11-19 ENCOUNTER — Other Ambulatory Visit: Payer: Self-pay | Admitting: "Endocrinology

## 2016-11-19 DIAGNOSIS — IMO0001 Reserved for inherently not codable concepts without codable children: Secondary | ICD-10-CM

## 2016-11-19 DIAGNOSIS — E1065 Type 1 diabetes mellitus with hyperglycemia: Principal | ICD-10-CM

## 2016-11-28 ENCOUNTER — Ambulatory Visit (INDEPENDENT_AMBULATORY_CARE_PROVIDER_SITE_OTHER): Payer: Commercial Managed Care - HMO | Admitting: *Deleted

## 2016-11-28 VITALS — BP 118/74 | Wt 257.0 lb

## 2016-11-28 DIAGNOSIS — E1065 Type 1 diabetes mellitus with hyperglycemia: Secondary | ICD-10-CM

## 2016-11-28 DIAGNOSIS — IMO0001 Reserved for inherently not codable concepts without codable children: Secondary | ICD-10-CM

## 2016-11-28 LAB — GLUCOSE, POCT (MANUAL RESULT ENTRY): POC Glucose: 284 mg/dl — AB (ref 70–99)

## 2016-11-28 NOTE — Progress Notes (Signed)
Transfer pump settings from old 530G to new 670G insulin pump.  [name] was here to transfer pump settings from old 530G insulin pump to his new 670G insulin pump. He has been using the 530G for over 4 years and he is very familiar with [employer]'s insulin pumps. He has not received the Guardian sensors yet.   Transfer pump settings as below: Insulin Pump Settings Basal rates Time  Uh/r 12a-6a 1.35 6a-2p  1.90 2p-8p  1.60 8p-12a 1.65 Total Basal 39.50 Units   BG Target Time  Target 12-6 130-150 6a-10p 120-120 10p-12a 130-150  IC Ratios Time  Ratio 12a-12a 7  Insulin Sensitivity Correction Factor 12a-12a 35   Active Insulin Time  3 hours  Bolus wizard   On Max Basal rate 2.5 U/Hr Max Bolus   25 units Auto Off  Off Bolus Increment 0.10 U Low reservoir  20 units Temp Basal  % Reverse correction On Dual/ square wave Off  Patient was able to add insulin pump settings with no problems. Added new reservoir to new insulin pump with no problems.  Will call back once he receives Guardian sensors to start them. Call our office if any questions or concerns.

## 2016-12-06 ENCOUNTER — Other Ambulatory Visit: Payer: Self-pay | Admitting: Allergy and Immunology

## 2016-12-29 ENCOUNTER — Ambulatory Visit (INDEPENDENT_AMBULATORY_CARE_PROVIDER_SITE_OTHER): Payer: Commercial Managed Care - HMO | Admitting: *Deleted

## 2016-12-29 ENCOUNTER — Ambulatory Visit (INDEPENDENT_AMBULATORY_CARE_PROVIDER_SITE_OTHER): Payer: Commercial Managed Care - HMO | Admitting: "Endocrinology

## 2016-12-29 ENCOUNTER — Encounter (INDEPENDENT_AMBULATORY_CARE_PROVIDER_SITE_OTHER): Payer: Self-pay | Admitting: "Endocrinology

## 2016-12-29 VITALS — BP 124/76 | HR 86 | Ht 70.87 in | Wt 255.2 lb

## 2016-12-29 DIAGNOSIS — IMO0001 Reserved for inherently not codable concepts without codable children: Secondary | ICD-10-CM

## 2016-12-29 DIAGNOSIS — E1065 Type 1 diabetes mellitus with hyperglycemia: Principal | ICD-10-CM

## 2016-12-29 DIAGNOSIS — R5383 Other fatigue: Secondary | ICD-10-CM

## 2016-12-29 DIAGNOSIS — E049 Nontoxic goiter, unspecified: Secondary | ICD-10-CM | POA: Diagnosis not present

## 2016-12-29 DIAGNOSIS — I10 Essential (primary) hypertension: Secondary | ICD-10-CM

## 2016-12-29 DIAGNOSIS — E782 Mixed hyperlipidemia: Secondary | ICD-10-CM | POA: Diagnosis not present

## 2016-12-29 DIAGNOSIS — E10649 Type 1 diabetes mellitus with hypoglycemia without coma: Secondary | ICD-10-CM

## 2016-12-29 DIAGNOSIS — F900 Attention-deficit hyperactivity disorder, predominantly inattentive type: Secondary | ICD-10-CM

## 2016-12-29 DIAGNOSIS — E6609 Other obesity due to excess calories: Secondary | ICD-10-CM

## 2016-12-29 LAB — T4, FREE: Free T4: 1.1 ng/dL (ref 0.8–1.8)

## 2016-12-29 LAB — COMPREHENSIVE METABOLIC PANEL
ALK PHOS: 72 U/L (ref 40–115)
ALT: 25 U/L (ref 9–46)
AST: 15 U/L (ref 10–40)
Albumin: 4 g/dL (ref 3.6–5.1)
BUN: 12 mg/dL (ref 7–25)
CO2: 28 mmol/L (ref 20–31)
Calcium: 9.1 mg/dL (ref 8.6–10.3)
Chloride: 104 mmol/L (ref 98–110)
Creat: 0.74 mg/dL (ref 0.60–1.35)
GLUCOSE: 201 mg/dL — AB (ref 70–99)
POTASSIUM: 4.1 mmol/L (ref 3.5–5.3)
SODIUM: 139 mmol/L (ref 135–146)
Total Bilirubin: 0.5 mg/dL (ref 0.2–1.2)
Total Protein: 6.1 g/dL (ref 6.1–8.1)

## 2016-12-29 LAB — LIPID PANEL
CHOLESTEROL: 171 mg/dL (ref <200)
HDL: 40 mg/dL — AB (ref >40)
LDL Cholesterol: 112 mg/dL — ABNORMAL HIGH (ref <100)
TRIGLYCERIDES: 93 mg/dL (ref <150)
Total CHOL/HDL Ratio: 4.3 Ratio (ref <5.0)
VLDL: 19 mg/dL (ref <30)

## 2016-12-29 LAB — TSH: TSH: 0.62 mIU/L (ref 0.40–4.50)

## 2016-12-29 LAB — POCT GLYCOSYLATED HEMOGLOBIN (HGB A1C): HEMOGLOBIN A1C: 9.1

## 2016-12-29 LAB — T3, FREE: T3, Free: 3.5 pg/mL (ref 2.3–4.2)

## 2016-12-29 LAB — GLUCOSE, POCT (MANUAL RESULT ENTRY): POC GLUCOSE: 223 mg/dL — AB (ref 70–99)

## 2016-12-29 NOTE — Progress Notes (Signed)
Guardian Sensor start  [name] was here for an office visit with Dr. Fransico MichaelBrennan and brought in his sensors to start them. [name] does not have any questions about his pump, he said that he was running a little high last month, not sure if it was to stress. No changes on basal settings today. Added sensor settings as listed below:  Sensor Settings: Low Alert  On 12a-12a  80 mg/ dL Alert before low Off Alert on Low  On  Snooze  On 15 mins  Suspend before low On Suspend on Low Off  High Alert  On !2a-12a  250 mg/dL Alert on High  On Alert before High  Off Snooze  2 hours  Patient was  Able to add settings to pump with no trouble.  Transmitter needed additional charging so he was not able to start sensor on. Advised to recharge transmitter and once its fully charged he can start sensor on Pump.  Advised to call me if he has any problems or concerns.

## 2016-12-29 NOTE — Patient Instructions (Addendum)
Follow up visit in one month.  

## 2016-12-29 NOTE — Progress Notes (Signed)
Subjective:  Patient Name: [name] Date of Birth: 03/23/1981  MRN: [id number]  [name]  presents to the office today for follow-up of his type 1 diabetes mellitus, goiter, obesity, combined hyperlipidemia, adult ADHD, fatigue, autonomic neuropathy, tachycardia, hypertension, microalbuminuria, and hypoglycemia.  HISTORY OF PRESENT ILLNESS:   [name] is a 36 y.o. Caucasian gentleman. [name] was unaccompanied.  1. The patient was first referred to me on 01/08/2006 by his family physician, Dr. Roanna Epley, for evaluation and management of type 1 diabetes mellitus and related problems. The patient was 36 years old.  A. [name] had been diagnosed with type 1 diabetes somewhere in 2001-2002, at the age of 1-19. He was initially diagnosed with type 2 diabetes mellitus, but was later re-classified as type 1 diabetes mellitus. He had been followed by Dr. Arther Dames, staff endocrinologist at [facility]. [name] was started on an insulin pump approximately 18 months prior to first seeing me. His pump was a Medtronic Paradigm 712. His blood glucose control was fair-to-poor. He was frequently not checking blood sugars or taking insulin boluses as often as he needed to. He frequently noted fast heart rate. The patient's past medical history was positive for hypertension, for which he was taking lisinopril, and for what his mother called "extreme ADHD". He had previously been taking ADHD medicines but had discontinued them due to adverse effects. He had prior ankle injuries and knee injuries, to include tears of the lateral menisci bilaterally. He had not had any surgeries. He was working for a [employer] that had many different jobs sites in different parts of the [place] and in other countries as well. 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 He did not use tobacco or drugs, but did drink alcohol occasionally.  B. On physical examination, his weight was 234  pounds, his height was 70 inches, his BMI was 33.2. Blood pressure was 128/70. Hemoglobin A1c was 9.5%. Heart rate was 96. He had normal affect and fair insight. He had a 25-30 gram goiter. He had normal 1+ DP pulses in his feet. He had normal sensation in his feet to touch, vibration, and monofilament. His CMP was normal except for glucose of 251. His cholesterol was 257, triglycerides 143, HDL 49, and LDL 179. TSH was 1.017, free T4 was 1.06, and free T3 was 3.3. His urinary microalbumin: creatinine ratio was 20.1 (normal less than 30).   C. The patient clearly needed better blood glucose control, which would only occur if he had better adherence to his plan. He appeared to have autonomic neuropathy, manifested by inappropriate sinus tachycardia.  His thyroid goiter suggested that he might have evolving Hashimoto's disease. He stated that he had lost some weight recently. I encouraged him to check his blood sugars more frequently and to take both correction boluses and food boluses.  2. 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 [name] His hemoglobin A1c values have varied from 8.3-11.2%. In the last year, however, his A1c's have varied from 9.5-10.5%. Throughout the 11 year period, the patient's autonomic neuropathy and tachycardia have remained essentially the same. On 12/03/10 his total cholesterol was 270, triglycerides 94, HDL 50, and LDL 201. I started him on Crestor, 10 mg per day. He has since begun treatment with atorvastatin  and his cholesterol values have improved.   3. The patient's last [facility] visit was on 09/26/16. At that visit I increased all of his basal rates and his BGs have been lower overall.   A.  In the interim he has been healthy.    B. He started on his new Medtronic 670G pump on 11/28/16. He just  received his Guardian 3 sensor and will start the sensor today. It was somewhat difficult for him to get used to the different ergonomics of the 670G compared to his prior pump, but he is doing better now.    C. He continues to have problems with lactose intolerance. When he avoids lactose, however, he is asymptomatic. Some foods such as shellfish, now cause his throat to swell up. He is tolerating losartan and atorvastatin well.     D. He is using Humalog aspart insulin in his insulin pump. He takes losartan, 50 mg/day and atorvastatin, 20 mg/day. He takes both medications fairly reliably.   E. 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 It is much easier for him to control his DM and to take time to work out. He is doing a lot of weight lifting and other resistance exercises. He is trying to do about 20 minutes on the elliptical in the mornings and walks for about 30 minutes in the evenings.   F. The results of his sleep study showed "mild OSA". He did not like the staff at the sleep center in [facility], so he has scheduled an appointment with another center in the near future.    4. Pertinent Review of Systems: Constitutional: The patient feels "pretty good, healthier".  He still occasionally feel tired and fatigued, but is much better overall.  Eyes: Vision is good. There are no significant eye complaints. Last exam was about 16 months ago. There were no signs of diabetic eye disease. He has re-scheduled a follow up exam.   Neck: He has not had had any further complaints of anterior neck swelling, soreness in the thyroid lobes, pressure sensations, or difficulty swallowing.  Heart: Heart rate increases with exercise or other physical activity. The patient has no complaints of palpitations, irregular heat beats, chest pain, or chest pressure. Gastrointestinal: Lactose intolerance as above. The patient has no other complaints of bloating after meals, excessive hunger, heartburn,  acid reflux, upset stomach, stomach aches or pains, or constipation. Legs: Muscle mass and strength seem normal. There are no complaints of numbness, tingling, burning, or pain. No edema is noted. Feet: There are no obvious foot problems now. There are no complaints of numbness, tingling, burning, or pain. No edema is noted. GU: No problems with libido or performance. Hypoglycemia: He has about 1 episode every week, still usually associated with him going too long between meals.      Emotional: He would like help with his Adult ADD.    5. BG printout: He changes his sites every 1-4 days, mostly every 3 days. He checks BGs 2-5 times per day, usually 4 times. He boluses 3-5 times per day, usually 4 times.  BG range is 92 to >400. He had 15 BGs >300, 4 of which were >400. Average BG is 257, compared with 285 at his last visit and with 279 at the prior visit.     PAST MEDICAL, FAMILY, AND SOCIAL HISTORY:  Past Medical History:  Diagnosis Date  . ADHD (attention deficit hyperactivity disorder)   . Autonomic neuropathy due to diabetes (HCC)   .  Diabetes (HCC)   . Fatigue   . Goiter   . High blood pressure   . High cholesterol   . Hypercholesterolemia   . Hypertension   . Hypoglycemia associated with diabetes (HCC)   . Obesity   . Tachycardia   . Type 1 diabetes mellitus not at goal [facility])   . Uncontrolled DM with microalbuminuria or microproteinuria     Family History  Problem Relation Age of Onset  . Dementia Paternal Aunt   . Schizophrenia Paternal Aunt   . Cancer Maternal Grandmother   . Diabetes Maternal Grandmother     T2 DM  . Cancer Maternal Grandfather   . Thyroid disease Neg Hx      Current Outpatient Prescriptions:  .  atorvastatin (LIPITOR) 20 MG tablet, Take 1.5 tablets daily., Disp: 45 tablet, Rfl: 6 .  insulin lispro (HUMALOG) 100 UNIT/ML injection, 300 units in insulin pump every 48-72 hours per Hyperglycemia and DKA protocols, Disp: 40 mL, Rfl: 6 .  losartan  (COZAAR) 50 MG tablet, TAKE 1 TABLET(50 MG) BY MOUTH DAILY, Disp: 30 tablet, Rfl: 5 .  ONE TOUCH ULTRA TEST test strip, TEST BLOOD SUGAR 6 TIMES DAILY, Disp: 200 each, Rfl: 6 .  AUVI-Q 0.3 MG/0.3ML SOAJ injection, Use as directed for life-threatening allergic reaction. (Patient not taking: Reported on 12/29/2016), Disp: 4 Device, Rfl: 2 .  famotidine (PEPCID) 20 MG tablet, Take 1 tablet (20 mg total) by mouth daily. (Patient not taking: Reported on 12/29/2016), Disp: 30 tablet, Rfl: 5 .  loratadine (CLARITIN) 10 MG tablet, Take 10 mg by mouth daily., Disp: , Rfl:  .  VENTOLIN HFA 108 (90 Base) MCG/ACT inhaler, Reported on 02/11/2016, Disp: , Rfl: 0  Allergies as of 12/29/2016  . (No Known Allergies)    1. Work and Family: He is now working in the office full-time.   2. Activities: He walks more and works out frequently.     3. Smoking, alcohol, or drugs: Occasional beer, about one-two per month. No tobacco or drugs. 4. Primary Care Provider: None - I recommended Dr. Guerry Bruin, MD  REVIEW OF SYSTEMS: There are no other significant problems involving Maddix's other body systems.   Objective:  Vital Signs:  BP 124/76   Pulse 86   Ht 5' 10.87" (1.8 m)   Wt 255 lb 3.2 oz (115.8 kg)   BMI 35.73 kg/m    Ht Readings from Last 3 Encounters:  12/29/16 5' 10.87" (1.8 m)  08/13/16 5' 10.2" (1.783 m)  02/07/16 6' (1.829 m)   Wt Readings from Last 3 Encounters:  12/29/16 255 lb 3.2 oz (115.8 kg)  11/28/16 257 lb (116.6 kg)  09/26/16 247 lb 12.8 oz (112.4 kg)   PHYSICAL EXAM:  Constitutional: The patient appears obese, but slimmer. He looks good today. He has lost almost 2 pounds in the past month. His affect and insight are normal.  Face: The face appears normal.  Eyes: There is no obvious arcus or proptosis. Moisture appears normal. Mouth: The oropharynx and tongue appear normal. Oral moisture is normal. Neck: The neck appears to be visibly normal. No carotid bruits are noted. His  strap muscles are larger, so it is more difficult to assess his thyroid gland size. The thyroid gland is low lying and still enlarged, but probably smaller, at about 21-22 grams in size. The right .lobe is only mildly enlarged, has a normal consistency, and is not tender to palpation. The left lobe is larger, has a fuller consistency,  and is not tender to palpation today.  Lungs: The lungs are clear to auscultation. Air movement is good.  Heart: Heart rate and rhythm are regular. Heart sounds S1 and S2 are normal. I did not appreciate any pathologic cardiac murmurs. Abdomen: The abdomen is enlarged. Bowel sounds are normal. There is no obvious hepatomegaly, splenomegaly, or other mass effect.  Arms: Muscle size and bulk are normal for age.  Hands: There is no tremor of his hands today. Phalangeal and metacarpophalangeal joints are normal. Palmar muscles are normal. Palmar skin is normal. Palmar moisture is also normal. Legs: Muscles appear normal for age. No edema is present. Feet: 1+ DP pulse on the right and 1+ on the left.  Neurologic: Strength is normal for age in both the upper and lower extremities. Muscle tone is normal. Sensation to touch is normal in both legs and is normal to touch, vibration, and monofilament in both feet.    LAB DATA:   Labs 12/29/16: HbA1c 9.1%, CBG 223  Labs 09/26/16: HbA1c 9.5%  Labs 08/13/16: Celiac panel negative; CMP normal except for glucose 207; CBC normal  Labs 06/13/16: HbA1c 9.8%; LH 3.6, FSH 2.3, testosterone 533 (ref [id number]), free testosterone 59.4 (ref 47-244)  Labs 02/11/16: HbA1c 9.2%  Labs 01/07/16: HbA1c 10.5%; CBC with Hgb elevated at 17.2%; LH 3.9, FSH 2.0; TSH 1.53, free T4 1.3, free T3 3.7; cholesterol 262, triglycerides 171, HDL 40, LDL 188; urinary microalbumin/creatinine ratio 31; CMP normal except for glucose 181  Labs 06/29/15. HbA1c 9.6%  Labs 12/28/14: Hemoglobin A1c 9.5% today, compared with 9.5%, at last visit and with 9.9% at the prior  visit.  Labs 08/03/14: HbA1c 9.5%; TSH 0.548,free T4 1.10, free T3 3.7, TPO antibody < 1, TSI 30; CMP normal except glucose 191; urinary microalbumin/creatinine ratio 27; cholesterol 260, triglycerides 90, HDL 51, LDL 191   Labs 10/05/13: HbA1c 9.9%  Labs 04/23/13: CMP normal, except glucose 144; cholesterol 216, triglycerides 97, HDL 39, LDL 158; urinary microalbumin/creatinine ratio 6.9; TSH 0.505, free T4 1.17, free T3 3.8  Labs: 01/21/12: TSH 1.251, free T4 1.10, free T3 3.6, CBC normal, CMP normal except for glucose 206, urinary microalbumin/creatinine ratio 6.3, cholesterol 187, triglycerides 67, HDL 45, LDL 129   Assessment and Plan:   ASSESSMENT:  1. T1DM: His HbA1c is lower due to taking better care of himself. , 2. Hypoglycemia: He has not had any documented low BG this month.  3. Hypertension: BP is good today. He needs to take meds regularly and to exercise regularly.  4. Combined hyperlipidemia: His lipids in March 2017 were still too high. Eating right, exercising, losing fat weight, and taking his atorvastatin regularly will help. 5. Autonomic neuropathy and tachycardia: His heart rate is much lower today. These problems fluctuate somewhat with his HbA1c levels over time. 6. Obesity: Weight has decreased in the past month, probably due to working out and eating out less.    7-8. Goiter/thyroiditis:   A. His thyroid gland is probably smaller today. The process of waxing and waning of thyroid gland size is c/w evolving Hashimoto's thyroiditis. His thyroiditis is clinically quiescent today.   B. He was euthyroid in March 2013, but was borderline hyperthyroid in June 2014. He was euthyroid in October 2015 and again in March 2017.  It is time for his annual TFTs.  9. Fatigue: This problem has improved, but he probably still has OSA that needs to be treated.  10. Obstructive sleep apnea: This problem will improve with weight loss,  but he may still need a mouth piece or CPAP. He says  that he will try to schedule an appointment with another sleep center.  11. Adult ADD: At last visit I gave him the website address for Dr. Piedad Climes, MD in [facility]. I again encouraged [name] to call that office.     PLAN:  1. Diagnostic:  Repeat HbA1c today.  Annual surveillance labs today. Call in two weeks on a Sunday or Wednesday evening. 2. Therapeutic: Follow DM care plan. Continue atorvastatin and losartan. Continue current basal rates: Basal Pattern 1 MN: 1.35 6 AM: 1.90 2 PM: 1.60 New 8 PM: 1.65  3. Patient education: Again discussed slow progression of long-term complications, especially when HTN and hyperlipidemia are co-morbidities. Walk for at least 45 minutes every day, but gradually increase to 60 minutes per day as tolerated. He will come in to see our diabetes educator in 2 weeks to begin the "auto mode" using his sensor-pump combination.  4. Follow-up:  1 month  Level of Service: This visit lasted in excess of 65 minutes. More than 50% of the visit was devoted to counseling.  Molli Knock, MD, CDE Adult and Pediatric Endocrinology  12/29/2016 10:55 AM

## 2016-12-30 LAB — MICROALBUMIN / CREATININE URINE RATIO
Creatinine, Urine: 151 mg/dL (ref 20–370)
MICROALB UR: 3.3 mg/dL
MICROALB/CREAT RATIO: 22 ug/mg{creat} (ref <30)

## 2016-12-31 ENCOUNTER — Other Ambulatory Visit: Payer: Self-pay | Admitting: "Endocrinology

## 2016-12-31 DIAGNOSIS — IMO0001 Reserved for inherently not codable concepts without codable children: Secondary | ICD-10-CM

## 2016-12-31 DIAGNOSIS — E1065 Type 1 diabetes mellitus with hyperglycemia: Principal | ICD-10-CM

## 2017-01-05 ENCOUNTER — Encounter (INDEPENDENT_AMBULATORY_CARE_PROVIDER_SITE_OTHER): Payer: Self-pay | Admitting: *Deleted

## 2017-01-12 ENCOUNTER — Ambulatory Visit (INDEPENDENT_AMBULATORY_CARE_PROVIDER_SITE_OTHER): Payer: Commercial Managed Care - HMO | Admitting: *Deleted

## 2017-01-12 VITALS — BP 108/64 | Wt 256.0 lb

## 2017-01-12 DIAGNOSIS — IMO0001 Reserved for inherently not codable concepts without codable children: Secondary | ICD-10-CM

## 2017-01-12 DIAGNOSIS — E1065 Type 1 diabetes mellitus with hyperglycemia: Secondary | ICD-10-CM

## 2017-01-12 LAB — GLUCOSE, POCT (MANUAL RESULT ENTRY): POC Glucose: 166 mg/dl — AB (ref 70–99)

## 2017-01-12 NOTE — Progress Notes (Signed)
670G Auto Mose Start   [name] was here to start on the Auto Mode Start of his 670G insulin pump. He has been using his 670G insulin pump and sensor for the last two weeks and is very happy with the out come. He is ready to start on the auto mode now.  Reviews and reminders before starting Auto Mode . Calibrating 4 times a day is optimal. It is best to calibrate when your glucose is not changing very rapidly 1 arrow up or down ok, 2-3 arrows not good calibration.  . Correct carb entry before a meal . Care Link Account activation    Note: When the Auto Mode setting is turned on, other steps must be completed for it to activate, or start working. If you are using Suspend before low or suspend on low, they are automatically turned off when Auto Mode becomes active.   Safe Basal Mode is different than Auto Mode, it activates when: 1. An SG reading is not available because your transmitter and pump are not communicating, or the sensor calibration has expired. 2. Your sensor might be reading lower than your actual glucose values. 3. Your BG value is different from your SG value by 35% or more. 4. After you, change your sensor, during the sensor warm up. 5. Auto Mode has been at your personal minimum Auto Mode basal delivery rate for 2 1/2 hours. 6. Auto Mode has been at your personal maximum Auto Mode basal delivery rate for 4 hours.   . The maximum time your pump will stay in Safe Basal is 90 minutes. However, it may be shorter than that and resolve itself before you are aware of it.    . Examples of these actions are entering a calibration, entering a new BG, or responding to a Lost Sensor alert.   Returning to Auto Mode Do not use Auto Mode for a period after giving a manual injection of insulin by syringe or pen. Manual injections are not accounted for in Auto Mode. Therefore, Auto Mode could deliver too much insulin. Too much insulin may cause Hypoglycemia.   Alarms and alerts in Auto Mode   The pump will exit Auto Mode if:   Auto Mode has been at maximum basal delivery for 4 hours.  Enter BG to continue in Auto Mode.  Or  Auto Mode has been at minimum delivery for 2:30 hours. Enter BG to continue in Auto Mode. Select OK. Enter a BG to continue in Auto Mode.  Assessment / Plan Patient was able to start Auto Mode on insulin pump with no problems Patient verbalized understanding information discussed with Auto and Manual modes. Continue to bolus before each meal, calibrate sensor 4 times a day and respond to any alerts on the insulin pump. Try to sign up on the Carelink account again, that way if you need the provider to check your Blood sugar results you can down load the pump at home and we can print it here at the office.  If any questions or concerns regarding your pump and or diabetes, please call our office.

## 2017-01-18 ENCOUNTER — Other Ambulatory Visit: Payer: Self-pay | Admitting: "Endocrinology

## 2017-01-18 DIAGNOSIS — E782 Mixed hyperlipidemia: Secondary | ICD-10-CM

## 2017-01-19 ENCOUNTER — Encounter (INDEPENDENT_AMBULATORY_CARE_PROVIDER_SITE_OTHER): Payer: Self-pay | Admitting: *Deleted

## 2017-01-29 ENCOUNTER — Encounter (INDEPENDENT_AMBULATORY_CARE_PROVIDER_SITE_OTHER): Payer: Self-pay | Admitting: "Endocrinology

## 2017-01-29 ENCOUNTER — Ambulatory Visit (INDEPENDENT_AMBULATORY_CARE_PROVIDER_SITE_OTHER): Payer: Commercial Managed Care - HMO | Admitting: "Endocrinology

## 2017-01-29 VITALS — BP 122/80 | HR 78 | Ht 70.87 in | Wt 256.4 lb

## 2017-01-29 DIAGNOSIS — E049 Nontoxic goiter, unspecified: Secondary | ICD-10-CM

## 2017-01-29 DIAGNOSIS — E6609 Other obesity due to excess calories: Secondary | ICD-10-CM | POA: Diagnosis not present

## 2017-01-29 DIAGNOSIS — E1043 Type 1 diabetes mellitus with diabetic autonomic (poly)neuropathy: Secondary | ICD-10-CM | POA: Diagnosis not present

## 2017-01-29 DIAGNOSIS — E10649 Type 1 diabetes mellitus with hypoglycemia without coma: Secondary | ICD-10-CM | POA: Diagnosis not present

## 2017-01-29 DIAGNOSIS — F902 Attention-deficit hyperactivity disorder, combined type: Secondary | ICD-10-CM

## 2017-01-29 DIAGNOSIS — G4733 Obstructive sleep apnea (adult) (pediatric): Secondary | ICD-10-CM

## 2017-01-29 DIAGNOSIS — R000 Tachycardia, unspecified: Secondary | ICD-10-CM

## 2017-01-29 DIAGNOSIS — E1065 Type 1 diabetes mellitus with hyperglycemia: Secondary | ICD-10-CM | POA: Diagnosis not present

## 2017-01-29 DIAGNOSIS — I10 Essential (primary) hypertension: Secondary | ICD-10-CM | POA: Diagnosis not present

## 2017-01-29 DIAGNOSIS — R5383 Other fatigue: Secondary | ICD-10-CM | POA: Diagnosis not present

## 2017-01-29 DIAGNOSIS — IMO0001 Reserved for inherently not codable concepts without codable children: Secondary | ICD-10-CM

## 2017-01-29 LAB — POCT GLUCOSE (DEVICE FOR HOME USE): POC GLUCOSE: 148 mg/dL — AB (ref 70–99)

## 2017-01-29 NOTE — Progress Notes (Signed)
Subjective:  Patient Name: [name] Date of Birth: 03/23/1981  MRN: [id number]  [name]  presents to the office today for follow-up of his type 1 diabetes mellitus, goiter, obesity, combined hyperlipidemia, adult ADHD, fatigue, autonomic neuropathy, tachycardia, hypertension, microalbuminuria, and hypoglycemia.  HISTORY OF PRESENT ILLNESS:   [name] is a 36 y.o. Caucasian gentleman. [name] was unaccompanied.  1. The patient was first referred to me on 01/08/2006 by his family physician, Dr. Roanna Epley, for evaluation and management of type 1 diabetes mellitus and related problems. The patient was 36 years old.  A. [name] had been diagnosed with type 1 diabetes somewhere in 2001-2002, at the age of 1-19. He was initially diagnosed with type 2 diabetes mellitus, but was later re-classified as type 1 diabetes mellitus. He had been followed by Dr. Arther Dames, staff endocrinologist at [facility]. [name] was started on an insulin pump approximately 18 months prior to first seeing me. His pump was a Medtronic Paradigm 712. His blood glucose control was fair-to-poor. He was frequently not checking blood sugars or taking insulin boluses as often as he needed to. He frequently noted fast heart rate. The patient's past medical history was positive for hypertension, for which he was taking lisinopril, and for what his mother called "extreme ADHD". He had previously been taking ADHD medicines but had discontinued them due to adverse effects. He had prior ankle injuries and knee injuries, to include tears of the lateral menisci bilaterally. He had not had any surgeries. He was working for a [employer] that had many different jobs sites in different parts of the [place] and in other countries as well. 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 He did not use tobacco or drugs, but did drink alcohol occasionally.  B. On physical examination, his weight was 234  pounds, his height was 70 inches, his BMI was 33.2. Blood pressure was 128/70. Hemoglobin A1c was 9.5%. Heart rate was 96. He had normal affect and fair insight. He had a 25-30 gram goiter. He had normal 1+ DP pulses in his feet. He had normal sensation in his feet to touch, vibration, and monofilament. His CMP was normal except for glucose of 251. His cholesterol was 257, triglycerides 143, HDL 49, and LDL 179. TSH was 1.017, free T4 was 1.06, and free T3 was 3.3. His urinary microalbumin: creatinine ratio was 20.1 (normal less than 30).   C. The patient clearly needed better blood glucose control, which would only occur if he had better adherence to his plan. He appeared to have autonomic neuropathy, manifested by inappropriate sinus tachycardia.  His thyroid goiter suggested that he might have evolving Hashimoto's disease. He stated that he had lost some weight recently. I encouraged him to check his blood sugars more frequently and to take both correction boluses and food boluses.  2. 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 [name] His hemoglobin A1c values have varied from 8.3-11.2%. In the last year, however, his A1c's have varied from 9.5-10.5%. Throughout the 11 year period, the patient's autonomic neuropathy and tachycardia have remained essentially the same. On 12/03/10 his total cholesterol was 270, triglycerides 94, HDL 50, and LDL 201. I started him on Crestor, 10 mg per day. He has since begun treatment with atorvastatin  and his cholesterol values have improved.   3. The patient's last [facility] visit was on 12/29/16. At that visit I increased all of his basal rates and his BGs have been lower overall.   A.  In the interim he has been healthy overall, but has had some back tenderness that he is treating with heat  and ibuprofen.   B. He is having more low BGs during the night since starting on auto mode on his Medtronic 670G pump two weeks ago. The pump is alerting him to the problem about 4-5 AM before his BGs drop too low. The combination of the Guardian sensor and the 670G pump seems to be working well.   C. He continues to have problems with lactose intolerance. When he avoids lactose, however, he is asymptomatic. Some foods such as shellfish cause his throat to swell up. He is tolerating losartan and atorvastatin well.     D. He is using Humalog aspart insulin in his insulin pump. He takes losartan, 50 mg/day and atorvastatin, 20 mg/day. He takes both medications fairly reliably.   E. 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 It is much easier for him to control his DM and to take time to work out. He is doing a lot of weight lifting and other resistance exercises. He was trying to do about 20 minutes on the elliptical in the mornings and walks for about 30 minutes in the evenings, but has not been exercising in the past week since "tweaking" his back.    F. The results of his sleep study showed "mild OSA". He did not like the staff at the sleep center in [facility], so he will schedule an appointment with another center in the near future.    4. Pertinent Review of Systems: Constitutional: The patient feels "not too bad, about the same".  He still occasionally feel tired and fatigued, but is much better overall.  Eyes: Vision is good. There are no significant eye complaints. Last exam was about 16 months ago. There were no signs of diabetic eye disease. He has re-scheduled a follow up exam in May.   Neck: He has noted occasional tenderness in his right thyroid area when he sneezes.  Heart: Heart rate increases with exercise or other physical activity. The patient has no complaints of palpitations, irregular heat beats, chest pain, or chest pressure.  Gastrointestinal: Lactose  intolerance as above. The patient has no other complaints of bloating after meals, excessive hunger, heartburn, acid reflux, upset stomach, stomach aches or pains, or constipation. Legs: Muscle mass and strength seem normal. There are no complaints of numbness, tingling, burning, or pain. No edema is noted. Feet: There are no obvious foot problems now. There are no complaints of numbness, tingling, burning, or pain. No edema is noted. GU: No problems with libido or performance. Hypoglycemia: He has about 1 episode of BG alerts every day, still usually associated with him going too long between meals.   Emotional: He would like help with his Adult ADD. He will make an appointment with Dr. Wylene Simmer in the near future.    5. BG printout: He changes his sites about every 4 days, although the BG report indicates that sometimes he does not actually change the sites for up to 6 days. He checks BGs 4-7 times per day, usually 4 times. He boluses 5-7 times per day, usually 4-5 times.  BG range is 100-327, compared with 92 to >400 at his last  visit. He had 2 BGs >300, none of which were >327. Average BG is 177, compared with 257 at his last visit.    6. CGM printout: The majority of his skin glucose readings were between 75-228, with an occasional skin glucose reading from 260-326. He did not have any sensor readings below 71. He treats the lower BG trends in the 4-5 AM timeframe.     PAST MEDICAL, FAMILY, AND SOCIAL HISTORY:  Past Medical History:  Diagnosis Date  . ADHD (attention deficit hyperactivity disorder)   . Autonomic neuropathy due to diabetes (HCC)   . Diabetes (HCC)   . Fatigue   . Goiter   . High blood pressure   . High cholesterol   . Hypercholesterolemia   . Hypertension   . Hypoglycemia associated with diabetes (HCC)   . Obesity   . Tachycardia   . Type 1 diabetes mellitus not at goal [facility])   . Uncontrolled DM with microalbuminuria or microproteinuria     Family History  Problem  Relation Age of Onset  . Dementia Paternal Aunt   . Schizophrenia Paternal Aunt   . Cancer Maternal Grandmother   . Diabetes Maternal Grandmother     T2 DM  . Cancer Maternal Grandfather   . Thyroid disease Neg Hx      Current Outpatient Prescriptions:  .  atorvastatin (LIPITOR) 20 MG tablet, TAKE 1.5 TABLETS BY MOUTH DAILY, Disp: 45 tablet, Rfl: 0 .  HUMALOG 100 UNIT/ML injection, USE 300 UNITS VIA INSULIN PUMP EVERY 48 TO 72 HOURS PER HYPERGLYCEMIA AND DKA PROTOCOLS, Disp: 40 mL, Rfl: 0 .  losartan (COZAAR) 50 MG tablet, TAKE 1 TABLET(50 MG) BY MOUTH DAILY, Disp: 30 tablet, Rfl: 5 .  AUVI-Q 0.3 MG/0.3ML SOAJ injection, Use as directed for life-threatening allergic reaction. (Patient not taking: Reported on 12/29/2016), Disp: 4 Device, Rfl: 2 .  famotidine (PEPCID) 20 MG tablet, Take 1 tablet (20 mg total) by mouth daily. (Patient not taking: Reported on 12/29/2016), Disp: 30 tablet, Rfl: 5 .  loratadine (CLARITIN) 10 MG tablet, Take 10 mg by mouth daily., Disp: , Rfl:  .  ONE TOUCH ULTRA TEST test strip, TEST BLOOD SUGAR 6 TIMES DAILY. (Patient not taking: Reported on 01/29/2017), Disp: 200 each, Rfl: 3 .  VENTOLIN HFA 108 (90 Base) MCG/ACT inhaler, Reported on 02/11/2016, Disp: , Rfl: 0  Allergies as of 01/29/2017  . (No Known Allergies)    1. Work and Family: He is now working in the office full-time.   2. Activities: He walks more and works out frequently.     3. Smoking, alcohol, or drugs: Occasional beer, about one-two per month. No tobacco or drugs. 4. Primary Care Provider: None - I recommended Dr. Guerry Bruin, MD  REVIEW OF SYSTEMS: There are no other significant problems involving [name]'s other body systems.   Objective:  Vital Signs:  BP 122/80   Pulse 78   Ht 5' 10.87" (1.8 m)   Wt 256 lb 6.4 oz (116.3 kg)   BMI 35.90 kg/m    Ht Readings from Last 3 Encounters:  01/29/17 5' 10.87" (1.8 m)  12/29/16 5' 10.87" (1.8 m)  08/13/16 5' 10.2" (1.783 m)   Wt Readings  from Last 3 Encounters:  01/29/17 256 lb 6.4 oz (116.3 kg)  01/12/17 256 lb (116.1 kg)  12/29/16 255 lb 3.2 oz (115.8 kg)   PHYSICAL EXAM:  Constitutional: The patient appears obese, but somewhat slimmer over time. He looks good today. He has gained  6.4 ounces in the past month. His affect and insight are normal.  Face: The face appears normal.  Eyes: There is no obvious arcus or proptosis. Moisture appears normal. Mouth: The oropharynx and tongue appear normal. Oral moisture is normal. Neck: The neck appears to be visibly normal. No carotid bruits are noted. His strap muscles are larger, so it is more difficult to assess his thyroid gland size. The thyroid gland is low lying and still enlarged at about 21-22 grams in size. The lobes are symmetric in size. The consistency of the lobes is normal. The lobes are not tender to palpation.   Lungs: The lungs are clear to auscultation. Air movement is good.  Heart: Heart rate and rhythm are regular. Heart sounds S1 and S2 are normal. I did not appreciate any pathologic cardiac murmurs. Abdomen: The abdomen is enlarged. Bowel sounds are normal. There is no obvious hepatomegaly, splenomegaly, or other mass effect.  Arms: Muscle size and bulk are normal for age.  Hands: There is no tremor of his hands today. Phalangeal and metacarpophalangeal joints are normal. Palmar muscles are normal. Palmar skin is normal. Palmar moisture is also normal. Legs: Muscles appear normal for age. No edema is present. Feet: 1+ DP pulse on the right and 1+ on the left.  Neurologic: Strength is normal for age in both the upper and lower extremities. Muscle tone is normal. Sensation to touch is normal in both legs and in both feet.    LAB DATA:   Labs 01/29/17: CBG 148  Labs 01/12/17: CBG 116  Labs 12/29/16: HbA1c 9.1%, CBG 223; TSH 0.62, free T4 1.1, free T3 3.5; urine microalbumin/creatinine ratio 22; cholesterol 171, triglycerides 93, HDL 40, LDL 112;  CMP glucose  201  Labs 09/26/16: HbA1c 9.5%  Labs 08/13/16: Celiac panel negative; CMP normal except for glucose 207; CBC normal  Labs 06/13/16: HbA1c 9.8%; LH 3.6, FSH 2.3, testosterone 533 (ref [id number]), free testosterone 59.4 (ref 47-244)  Labs 02/11/16: HbA1c 9.2%  Labs 01/07/16: HbA1c 10.5%; CBC with Hgb elevated at 17.2%; LH 3.9, FSH 2.0; TSH 1.53, free T4 1.3, free T3 3.7; cholesterol 262, triglycerides 171, HDL 40, LDL 188; urinary microalbumin/creatinine ratio 31; CMP normal except for glucose 181  Labs 06/29/15. HbA1c 9.6%  Labs 12/28/14: Hemoglobin A1c 9.5% today, compared with 9.5%, at last visit and with 9.9% at the prior visit.  Labs 08/03/14: HbA1c 9.5%; TSH 0.548,free T4 1.10, free T3 3.7, TPO antibody < 1, TSI 30; CMP normal except glucose 191; urinary microalbumin/creatinine ratio 27; cholesterol 260, triglycerides 90, HDL 51, LDL 191   Labs 10/05/13: HbA1c 9.9%  Labs 04/23/13: CMP normal, except glucose 144; cholesterol 216, triglycerides 97, HDL 39, LDL 158; urinary microalbumin/creatinine ratio 6.9; TSH 0.505, free T4 1.17, free T3 3.8  Labs: 01/21/12: TSH 1.251, free T4 1.10, free T3 3.6, CBC normal, CMP normal except for glucose 206, urinary microalbumin/creatinine ratio 6.3, cholesterol 187, triglycerides 67, HDL 45, LDL 129   Assessment and Plan:   ASSESSMENT:  1. T1DM: His HbA1c was lower at his last visit due to him taking better care of himself. His average BG for the past two weeks is even better. The combination of the Medtronic 670G pump and Guardian sensor are really helping him achieve better BG control.  2. Hypoglycemia: He has not had any documented low BGs this month, although his alerts have identified many potential low BGs.   3. Hypertension: SBP is good today. DBP is still somewhat elevated. He needs  to take meds regularly and to exercise regularly.  4. Combined hyperlipidemia: His lipids in March 2017 were still too high, but in March 2018 were much better. Eating  right, exercising, losing fat weight, and taking his atorvastatin regularly will help. 5. Autonomic neuropathy and tachycardia: His heart rate lower today. These problems fluctuate somewhat with his HbA1c levels over time. 6. Obesity: Weight has increased slightly in the past month. He needs to be careful.     7-8. Goiter/thyroiditis:   A. His thyroid gland is about the same size today. The process of waxing and waning of thyroid gland size is c/w evolving Hashimoto's thyroiditis. His thyroiditis is clinically quiescent today.   B. He was euthyroid in March 2013, but was borderline hyperthyroid in June 2014. He was euthyroid in October 2015 and again in March 2017.  He was also euthyroid again in March 2018.  9. Fatigue: This problem has improved, but he probably still has OSA that needs to be treated.  10. Obstructive sleep apnea: This problem will improve with weight loss, but he may still need a mouth piece or CPAP. He says that he will try to schedule an appointment with another sleep center.  11. Adult ADD: At last visit I gave him the website address for Dr. Piedad Climes, MD in [facility]. I again encouraged [name] to call that office.     PLAN:  1. Diagnostic:  Repeat CBG today. Call in two weeks on a Sunday or Wednesday evening. 2. Therapeutic: Follow DM care plan. Continue atorvastatin and losartan. Change current basal rates: Basal Pattern 1 MN: 1.35 -> 1.20 6 AM: 1.90 2 PM: 1.60 New 8 PM: 1.65  3. Patient education: Discussed his improvements in BGs since being on the auto mode.Discusses his improvements in lipids and microalbumin/creatinine ratio. Walk for at least 45 minutes every day, but gradually increase to 60 minutes per day as tolerated. 4. Follow-up:  1 month  Level of Service: This visit lasted in excess of 65 minutes. More than 50% of the visit was devoted to counseling.  Molli Knock, MD, CDE Adult and Pediatric Endocrinology  01/29/2017 9:31  AM

## 2017-01-29 NOTE — Patient Instructions (Signed)
Follow up visit in one month.  

## 2017-02-02 ENCOUNTER — Other Ambulatory Visit: Payer: Self-pay | Admitting: "Endocrinology

## 2017-02-02 DIAGNOSIS — E1065 Type 1 diabetes mellitus with hyperglycemia: Principal | ICD-10-CM

## 2017-02-02 DIAGNOSIS — IMO0001 Reserved for inherently not codable concepts without codable children: Secondary | ICD-10-CM

## 2017-02-03 ENCOUNTER — Encounter: Payer: Self-pay | Admitting: "Endocrinology

## 2017-02-03 LAB — HM DIABETES EYE EXAM

## 2017-02-20 ENCOUNTER — Encounter (INDEPENDENT_AMBULATORY_CARE_PROVIDER_SITE_OTHER): Payer: Self-pay | Admitting: "Endocrinology

## 2017-02-20 ENCOUNTER — Ambulatory Visit (INDEPENDENT_AMBULATORY_CARE_PROVIDER_SITE_OTHER): Payer: Commercial Managed Care - HMO | Admitting: "Endocrinology

## 2017-02-20 VITALS — BP 118/68 | HR 108 | Wt 249.2 lb

## 2017-02-20 DIAGNOSIS — E1043 Type 1 diabetes mellitus with diabetic autonomic (poly)neuropathy: Secondary | ICD-10-CM | POA: Diagnosis not present

## 2017-02-20 DIAGNOSIS — E083552 Diabetes mellitus due to underlying condition with stable proliferative diabetic retinopathy, left eye: Secondary | ICD-10-CM | POA: Diagnosis not present

## 2017-02-20 DIAGNOSIS — F988 Other specified behavioral and emotional disorders with onset usually occurring in childhood and adolescence: Secondary | ICD-10-CM

## 2017-02-20 DIAGNOSIS — E063 Autoimmune thyroiditis: Secondary | ICD-10-CM

## 2017-02-20 DIAGNOSIS — E10649 Type 1 diabetes mellitus with hypoglycemia without coma: Secondary | ICD-10-CM | POA: Diagnosis not present

## 2017-02-20 DIAGNOSIS — E1065 Type 1 diabetes mellitus with hyperglycemia: Secondary | ICD-10-CM

## 2017-02-20 DIAGNOSIS — IMO0001 Reserved for inherently not codable concepts without codable children: Secondary | ICD-10-CM

## 2017-02-20 DIAGNOSIS — I10 Essential (primary) hypertension: Secondary | ICD-10-CM | POA: Diagnosis not present

## 2017-02-20 DIAGNOSIS — E049 Nontoxic goiter, unspecified: Secondary | ICD-10-CM

## 2017-02-20 LAB — POCT GLUCOSE (DEVICE FOR HOME USE): Glucose Fasting, POC: 180 mg/dL — AB (ref 70–99)

## 2017-02-20 NOTE — Progress Notes (Signed)
Subjective:  Patient Name: [name] Date of Birth: [birth year]  MRN: [id number]  [name]  presents to the office today for follow-up of his type 1 diabetes mellitus, goiter, obesity, combined hyperlipidemia, adult ADHD, fatigue, autonomic neuropathy, tachycardia, hypertension, microalbuminuria, and hypoglycemia.  HISTORY OF PRESENT ILLNESS:   [name] is a 36 y.o. Caucasian gentleman. [name] was unaccompanied.  1. The patient was first referred to me on 01/08/2006 by his family physician, Dr. Roanna Epley, for evaluation and management of type 1 diabetes mellitus and related problems. The patient was 36 years old.  A. [name] had been diagnosed with type 1 diabetes somewhere in 2001-2002, at the age of 30-19. He was initially diagnosed with type 2 diabetes mellitus, but was later re-classified as type 1 diabetes mellitus. He had been followed by Dr. Arther Dames, staff endocrinologist at [facility]. [name] was started on an insulin pump approximately 18 months prior to first seeing me. His pump was a Medtronic Paradigm 712. His blood glucose control was fair-to-poor. He was frequently not checking blood sugars or taking insulin boluses as often as he needed to. He frequently noted fast heart rate. The patient's past medical history was positive for hypertension, for which he was taking lisinopril, and for what his mother called "extreme ADHD". He had previously been taking ADHD medicines but had discontinued them due to adverse effects. He had prior ankle injuries and knee injuries, to include tears of the lateral menisci bilaterally. He had not had any surgeries. He was working for a [employer] that had many different job sites in different parts of the [place] and in other countries as well. 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 He did not use tobacco or drugs, but did drink alcohol occasionally.  B. On physical examination, his weight was 234  pounds, his height was 70 inches, his BMI was 33.2. Blood pressure was 128/70. Hemoglobin A1c was 9.5%. Heart rate was 96. He had normal affect and fair insight. He had a 25-30 gram goiter. He had normal 1+ DP pulses in his feet. He had normal sensation in his feet to touch, vibration, and monofilament. His CMP was normal except for glucose of 251. His cholesterol was 257, triglycerides 143, HDL 49, and LDL 179. TSH was 1.017, free T4 was 1.06, and free T3 was 3.3. His urinary microalbumin: creatinine ratio was 20.1 (normal less than 30).   C. The patient clearly needed better blood glucose control, which would only occur if he had better adherence to his plan. He appeared to have autonomic neuropathy, manifested by inappropriate sinus tachycardia.  His thyroid goiter suggested that he might have evolving Hashimoto's disease. He stated that he had lost some weight recently. I encouraged him to check his blood sugars more frequently and to take both correction boluses and food boluses.  2. 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 [name] His hemoglobin A1c values have varied from 8.3-11.2%. In the last year, however, his A1c's have varied from 9.5-10.5%. Throughout the 11 year period, the patient's autonomic neuropathy and tachycardia have remained essentially the same. On 12/03/10 his total cholesterol was 270, triglycerides 94, HDL 50, and LDL 201. I started him on Crestor, 10 mg per day. He has since begun treatment with atorvastatin  and his cholesterol values have improved. He has also been converted to a Medtronic 670G pump and Guardian 3 CGM sensor.  3. The patient's last [facility] visit was on 01/29/17. At that visit I decreased his basal rate from midnight to 6 AM in order to reduce the frequency of nocturnal hypoglycemia.  He says that this change did help.    A.  In the interim he has been "pretty sick" for the past week or so. He has had severe fatigue, numbness and muscle cramps on his chest, arms, hands, legs, and feet, was jittery, and had head congestion. He went to the ED and was told that he did not have flu. He is better, but the illness has not yet resolved. His "BGs were not too bad" during the illness.   B. He is not having any low BGs during the night.   C. He is on auto mode most of the time. He feels that the auto mode made the difference in BG control during his illness.  The combination of the Guardian sensor and the 670G pump seems to be working well.   D. He continues to have problems with lactose intolerance. When he avoids lactose, however, he is asymptomatic. Some foods such as shellfish cause his throat to swell up. He is tolerating losartan and atorvastatin well.     E. He is using Humalog aspart insulin in his insulin pump. He takes losartan, 50 mg/day and atorvastatin, 20 mg/day. He takes both medications fairly reliably.   F. 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 It is much easier for him to control his DM and to take time to work out. He is doing a lot of weight lifting and other resistance exercises. He was trying to do about 20 minutes on the elliptical in the mornings and walks for about 30 minutes in the evenings, but has not been exercising in the past 10 days due to his illness.     G. The results of his sleep study showed "mild OSA". He did not like the staff at the sleep center in [facility], so he will schedule an appointment with another center in the near future.    4. Pertinent Review of Systems: Constitutional: The patient feels "kinda bad, but better". He is still jittery. He stopped caffeine two weeks ago. He is still pretty tired and fatigued.   Eyes: Vision is good. There are no significant eye complaints. Last exam was about one month ago. He had  to have laser treatment of vessels in his left eye. He will have a follow up exam next week.    Neck: He has had more frequent anterior neck swelling and tenderness during this illness.  Heart: His heart rate felt faster during the illness. The patient has no other complaints of palpitations, irregular heat beats, chest pain, or chest pressure.  Gastrointestinal: He has had more nausea during this illness, but no vomiting or diarrhea. The patient has no other complaints of bloating after meals, excessive hunger, heartburn, acid reflux, upset stomach, stomach aches or pains, or constipation. Legs: As above. Calf muscles are still tight and tingle, but are now less symptomatic. Muscle mass and strength seem normal. There are no complaints of numbness, tingling, burning, or pain. No edema is noted. Feet: There are no obvious foot problems now. There are no complaints of numbness, burning, or pain. No edema is noted. GU: No problems with libido or performance.   Hypoglycemia:  He has had about 2 episodes of low BG in the past 10 days.    Emotional: He still wants help with his Adult ADD. He made an appointment with Dr. Piedad Climes in [place].    5. BG printout: We have data from 02/07/17-02/20/17. He changes sites every 1-3 days. He checks BGs 5-9 times daily, average 7.2 times. BGs varied from 86-257, average 159. BGs were more variable and higher on the days that he was most sick, varying from 86-254.  Prior to the illness, BGs were the most stable on 02/14/17, varying from 100-135. BGs were almost as stable on 02/13/17, with BGs varying from 100-165.    6. CGM printout:  Skin glucose values and BG values correlated very well. Average SG was 146. He was in auto mode 98% of the time. His highest SGs occurred around 2 PM and his lowest SGs from 6-10 PM.   PAST MEDICAL, FAMILY, AND SOCIAL HISTORY:  Past Medical History:  Diagnosis Date  . ADHD (attention deficit hyperactivity disorder)   . Autonomic  neuropathy due to diabetes (HCC)   . Diabetes (HCC)   . Fatigue   . Goiter   . High blood pressure   . High cholesterol   . Hypercholesterolemia   . Hypertension   . Hypoglycemia associated with diabetes (HCC)   . Obesity   . Tachycardia   . Type 1 diabetes mellitus not at goal [facility])   . Uncontrolled DM with microalbuminuria or microproteinuria     Family History  Problem Relation Age of Onset  . Dementia Paternal Aunt   . Schizophrenia Paternal Aunt   . Cancer Maternal Grandmother   . Diabetes Maternal Grandmother     T2 DM  . Cancer Maternal Grandfather   . Thyroid disease Neg Hx      Current Outpatient Prescriptions:  .  atorvastatin (LIPITOR) 20 MG tablet, TAKE 1.5 TABLETS BY MOUTH DAILY, Disp: 45 tablet, Rfl: 0 .  HUMALOG 100 UNIT/ML injection, INJECT 300 UNITS VIA INSULIN PUMP EVERY 48 TO 72 HOURS PER HYPERGLYCEMIA AND DKA PROTOCOLS, Disp: 40 mL, Rfl: 0 .  losartan (COZAAR) 50 MG tablet, TAKE 1 TABLET(50 MG) BY MOUTH DAILY, Disp: 30 tablet, Rfl: 5 .  AUVI-Q 0.3 MG/0.3ML SOAJ injection, Use as directed for life-threatening allergic reaction. (Patient not taking: Reported on 12/29/2016), Disp: 4 Device, Rfl: 2 .  famotidine (PEPCID) 20 MG tablet, Take 1 tablet (20 mg total) by mouth daily. (Patient not taking: Reported on 12/29/2016), Disp: 30 tablet, Rfl: 5 .  loratadine (CLARITIN) 10 MG tablet, Take 10 mg by mouth daily., Disp: , Rfl:  .  ONE TOUCH ULTRA TEST test strip, TEST BLOOD SUGAR 6 TIMES DAILY. (Patient not taking: Reported on 01/29/2017), Disp: 200 each, Rfl: 3 .  VENTOLIN HFA 108 (90 Base) MCG/ACT inhaler, Reported on 02/11/2016, Disp: , Rfl: 0  Allergies as of 02/20/2017  . (No Known Allergies)    1. Work and Family: He is now working in the office full-time.   2. Activities: He was walking more and working out frequently prior to this recent illness.     3. Smoking, alcohol, or drugs: Occasional beer, about one-two per month. No tobacco or drugs. 4. Primary Care  Provider: None - I recommended Dr. Guerry Bruin, MD  REVIEW OF SYSTEMS: There are no other significant problems involving [name]'s other body systems.   Objective:  Vital Signs:  BP 118/68   Pulse (!) 108   Wt 249 lb  3.2 oz (113 kg)   BMI 34.89 kg/m    Ht Readings from Last 3 Encounters:  01/29/17 5' 10.87" (1.8 m)  12/29/16 5' 10.87" (1.8 m)  08/13/16 5' 10.2" (1.783 m)   Wt Readings from Last 3 Encounters:  02/20/17 249 lb 3.2 oz (113 kg)  01/29/17 256 lb 6.4 oz (116.3 kg)  01/12/17 256 lb (116.1 kg)   PHYSICAL EXAM:  Constitutional: The patient appears obese, but somewhat slimmer over time. He looks somewhat ill and tired today. He has lost 7 pounds in the past month. His affect and insight are normal.  Face: The face appears normal.  Eyes: There is no obvious arcus or proptosis. Moisture appears normal. Mouth: The oropharynx and tongue appear normal. Oral moisture is normal. Neck: The neck appears to be visibly normal. No carotid bruits are noted. His strap muscles are larger, so it is more difficult to assess his thyroid gland size. The thyroid gland is low lying and still enlarged at about 21-22 grams in size. The lobes are symmetric in size. The consistency of the lobes is normal. The lobes are both tender to palpation today, the right lobe more so.    Lungs: The lungs are clear to auscultation. Air movement is good.  Heart: Heart rate and rhythm are regular. Heart sounds S1 and S2 are normal. I did not appreciate any pathologic cardiac murmurs. Abdomen: The abdomen is enlarged. Bowel sounds are normal. There is no obvious hepatomegaly, splenomegaly, or other mass effect.  Arms: Muscle size and bulk are normal for age. The forearm flexor muscles are tender.  Hands: There is no tremor of his hands today. Phalangeal and metacarpophalangeal joints are normal. Palmar muscles are normal. Palmar skin is normal. Palmar moisture is also normal. Legs: Muscles appear normal for  age. No edema is present. Neurologic: Strength is normal for age in both the upper and lower extremities. Muscle tone is normal. Sensation to touch is normal in both legs, but decreased in the right and left palmar aspects of the 4th and 5th fingers in the distribution of the ulnar nerve.    LAB DATA:   Labs 02/20/17: CBG 180 fasting  Labs 01/29/17: CBG 148  Labs 01/12/17: CBG 116  Labs 12/29/16: HbA1c 9.1%, CBG 223; TSH 0.62, free T4 1.1, free T3 3.5; urine microalbumin/creatinine ratio 22; cholesterol 171, triglycerides 93, HDL 40, LDL 112;  CMP glucose 201  Labs 09/26/16: HbA1c 9.5%  Labs 08/13/16: Celiac panel negative; CMP normal except for glucose 207; CBC normal  Labs 06/13/16: HbA1c 9.8%; LH 3.6, FSH 2.3, testosterone 533 (ref [id number]), free testosterone 59.4 (ref 47-244)  Labs 02/11/16: HbA1c 9.2%  Labs 01/07/16: HbA1c 10.5%; CBC with Hgb elevated at 17.2%; LH 3.9, FSH 2.0; TSH 1.53, free T4 1.3, free T3 3.7; cholesterol 262, triglycerides 171, HDL 40, LDL 188; urinary microalbumin/creatinine ratio 31; CMP normal except for glucose 181  Labs 06/29/15. HbA1c 9.6%  Labs 12/28/14: Hemoglobin A1c 9.5% today, compared with 9.5%, at last visit and with 9.9% at the prior visit.  Labs 08/03/14: HbA1c 9.5%; TSH 0.548,free T4 1.10, free T3 3.7, TPO antibody < 1, TSI 30; CMP normal except glucose 191; urinary microalbumin/creatinine ratio 27; cholesterol 260, triglycerides 90, HDL 51, LDL 191   Labs 10/05/13: HbA1c 9.9%  Labs 04/23/13: CMP normal, except glucose 144; cholesterol 216, triglycerides 97, HDL 39, LDL 158; urinary microalbumin/creatinine ratio 6.9; TSH 0.505, free T4 1.17, free T3 3.8  Labs: 01/21/12: TSH 1.251, free T4 1.10, free  T3 3.6, CBC normal, CMP normal except for glucose 206, urinary microalbumin/creatinine ratio 6.3, cholesterol 187, triglycerides 67, HDL 45, LDL 129   Assessment and Plan:   ASSESSMENT:  1. T1DM: His BGs are lower and much more stable on auto mode.  Although he has been ill, the BGs have been surprisingly good.  The combination of the Medtronic 670G pump and Guardian sensor are really helping him achieve better BG control.  2. Hypoglycemia: He has not had any documented low BGs or threshold suspends this month.   3. Hypertension: SBP is good today. He needs to continue to take meds regularly and to exercise regularly.  4. Combined hyperlipidemia: His lipids in March 2017 were still too high, but in March 2018 were much better. Eating right, exercising, losing fat weight, and taking his atorvastatin regularly will help. 5. Autonomic neuropathy and tachycardia: His heart rate is higher today, c/w his illness. These problems fluctuate somewhat with his HbA1c levels over time. 6. Obesity: Weight has decreased in the past month, probably in part due to his illness.      7-8. Goiter/thyroiditis:   A. His thyroid gland is about the same size today. The process of waxing and waning of thyroid gland size is c/w evolving Hashimoto's thyroiditis. His thyroiditis is clinically active bilaterally today.    B. He was euthyroid in March 2013, but was borderline hyperthyroid in June 2014. He was euthyroid in October 2015 and again in March 2017.  He was also euthyroid again in March 2018.  9. Fatigue: This problem has improved, but he probably still has OSA that needs to be treated.  10. Obstructive sleep apnea: This problem will improve with weight loss, but he may still need a mouth piece or CPAP. He says that he will try to schedule an appointment with another sleep center.  11. Adult ADD: He has an appointment with Dr. Piedad Climes, MD in [place] soon.  12. Proliferative diabetic retinopathy: This problem is due to his long period of poorly controled BGs. It will take at least 6-12 months of near-normal BGs for his retinopathy to stabilize.    PLAN:  1. Diagnostic:  Repeat CBG today. Call in two weeks on a Sunday or Wednesday evening. 2. Therapeutic:  Follow DM care plan. Continue atorvastatin and losartan. Continue his current pump settings.  Basal Pattern 1 MN: 1.20 6 AM: 1.90 2 PM: 1.60 New 8 PM: 1.65  3. Patient education: We discussed his significant improvements in BGs since being on the auto mode. We also discussed the need for him to resume walking and working out as soon as he recovers from this illness. I again asked him to walk for at least 45 minutes every day, but gradually increase to 60 minutes per day as tolerated. I praised him for doing such a great job of taking better care of his T1DM.  4. Follow-up:  1 month  Level of Service: This visit lasted in excess of 55 minutes. More than 50% of the visit was devoted to counseling.  Molli Knock, MD, CDE Adult and Pediatric Endocrinology  02/20/2017 9:47 AM

## 2017-02-20 NOTE — Patient Instructions (Signed)
Follow up visit in 4 weeks. Please call Dr. Fransico Michael in 2 weeks on a Wednesday or Sunday evening to discuss BGs.

## 2017-02-28 ENCOUNTER — Encounter (HOSPITAL_COMMUNITY): Payer: Self-pay

## 2017-02-28 ENCOUNTER — Emergency Department (HOSPITAL_COMMUNITY)
Admission: EM | Admit: 2017-02-28 | Discharge: 2017-02-28 | Disposition: A | Discharge status: Home / Self Care | Payer: Commercial Managed Care - HMO | Attending: Emergency Medicine | Admitting: Emergency Medicine

## 2017-02-28 ENCOUNTER — Emergency Department (HOSPITAL_COMMUNITY): Payer: Commercial Managed Care - HMO

## 2017-02-28 DIAGNOSIS — F909 Attention-deficit hyperactivity disorder, unspecified type: Secondary | ICD-10-CM | POA: Insufficient documentation

## 2017-02-28 DIAGNOSIS — R0602 Shortness of breath: Secondary | ICD-10-CM | POA: Diagnosis not present

## 2017-02-28 DIAGNOSIS — R0789 Other chest pain: Secondary | ICD-10-CM | POA: Diagnosis present

## 2017-02-28 DIAGNOSIS — Z79899 Other long term (current) drug therapy: Secondary | ICD-10-CM | POA: Diagnosis not present

## 2017-02-28 DIAGNOSIS — I10 Essential (primary) hypertension: Secondary | ICD-10-CM | POA: Diagnosis not present

## 2017-02-28 DIAGNOSIS — E1043 Type 1 diabetes mellitus with diabetic autonomic (poly)neuropathy: Secondary | ICD-10-CM | POA: Insufficient documentation

## 2017-02-28 DIAGNOSIS — M791 Myalgia, unspecified site: Secondary | ICD-10-CM

## 2017-02-28 LAB — COMPREHENSIVE METABOLIC PANEL WITH GFR
ALT: 31 U/L (ref 17–63)
AST: 20 U/L (ref 15–41)
Albumin: 4.3 g/dL (ref 3.5–5.0)
Alkaline Phosphatase: 65 U/L (ref 38–126)
Anion gap: 10 (ref 5–15)
BUN: 9 mg/dL (ref 6–20)
CO2: 25 mmol/L (ref 22–32)
Calcium: 9.3 mg/dL (ref 8.9–10.3)
Chloride: 101 mmol/L (ref 101–111)
Creatinine, Ser: 0.9 mg/dL (ref 0.61–1.24)
GFR calc Af Amer: >60 mL/min
GFR calc non Af Amer: >60 mL/min
Glucose, Bld: 270 mg/dL — ABNORMAL HIGH (ref 65–99)
Potassium: 4 mmol/L (ref 3.5–5.1)
Sodium: 136 mmol/L (ref 135–145)
Total Bilirubin: 1.3 mg/dL — ABNORMAL HIGH (ref 0.3–1.2)
Total Protein: 6.9 g/dL (ref 6.5–8.1)

## 2017-02-28 LAB — CBC WITH DIFFERENTIAL/PLATELET
Basophils Absolute: 0.1 10*3/uL (ref 0.0–0.1)
Basophils Relative: 1 %
EOS PCT: 1 %
Eosinophils Absolute: 0.1 10*3/uL (ref 0.0–0.7)
HEMATOCRIT: 48.2 % (ref 39.0–52.0)
Hemoglobin: 16.8 g/dL (ref 13.0–17.0)
LYMPHS PCT: 15 %
Lymphs Abs: 1.5 10*3/uL (ref 0.7–4.0)
MCH: 29.7 pg (ref 26.0–34.0)
MCHC: 34.9 g/dL (ref 30.0–36.0)
MCV: 85.2 fL (ref 78.0–100.0)
MONO ABS: 0.8 10*3/uL (ref 0.1–1.0)
MONOS PCT: 8 %
NEUTROS ABS: 7.6 10*3/uL (ref 1.7–7.7)
Neutrophils Relative %: 75 %
PLATELETS: 280 10*3/uL (ref 150–400)
RBC: 5.66 MIL/uL (ref 4.22–5.81)
RDW: 12.4 % (ref 11.5–15.5)
WBC: 10 10*3/uL (ref 4.0–10.5)

## 2017-02-28 LAB — I-STAT TROPONIN, ED: TROPONIN I, POC: 0 ng/mL (ref 0.00–0.08)

## 2017-02-28 LAB — BRAIN NATRIURETIC PEPTIDE: B Natriuretic Peptide: 9.4 pg/mL (ref 0.0–100.0)

## 2017-02-28 LAB — D-DIMER, QUANTITATIVE (NOT AT ARMC)

## 2017-02-28 NOTE — ED Triage Notes (Signed)
Pt presents with 3 week h/o generalized cramping and nausea, has been seen for same and treated with ibuprofen.  Pt reports yesterday, he became short of breath, reports productive cough with yellow phlegm and chest discomfort when he lays down.

## 2017-02-28 NOTE — Discharge Instructions (Signed)
There were no significant findings, to be concerned about.  It is safe to use Tylenol, for pain, and consider applying heat to sore areas.  If your symptoms continue or worsen you should follow-up with a primary care doctor.  Follow-up with your endocrinologist, as scheduled.

## 2017-02-28 NOTE — ED Notes (Signed)
Papers reviewed with patient and family and they verbalize understanding and intent to follow up if needed

## 2017-02-28 NOTE — ED Provider Notes (Signed)
[facility]-EMER[facility]ENCY DEPT Provider Note   CSN: [id number] Arrival date & time: 02/28/17  1300     History   Chief Complaint Chief Complaint  Patient presents with  . Shortness of Breath    HPI [name] is a 36 y.o. male.  Presents for evaluation of progression of symptoms, from cramping in his abdomen with nausea, to chest discomfort.  He also reports productive cough with yellow to clear phlegm, but no associated fever or shortness of breath.  His chest pain is mild.  He is worried about myocardial infarction.  He follows up with his endocrinologist closely, and saw him several days ago.  He continues to use his insulin pump, as prescribed and take all of his medications, regularly.  No prior similar episodes of chest discomfort.  Relative to the abdominal discomfort his endocrinologist did not find any significant abnormalities when he checked him for it.  There are no other known modifying factors.  HPI  Past Medical History:  Diagnosis Date  . ADHD (attention deficit hyperactivity disorder)   . Autonomic neuropathy due to diabetes (HCC)   . Diabetes (HCC)   . Fatigue   . Goiter   . High blood pressure   . High cholesterol   . Hypercholesterolemia   . Hypertension   . Hypoglycemia associated with diabetes (HCC)   . Obesity   . Tachycardia   . Type 1 diabetes mellitus not at goal [facility])   . Uncontrolled DM with microalbuminuria or microproteinuria     Patient Active Problem List   Diagnosis Date Noted  . Combined hyperlipidemia 02/11/2016  . Metatarsalgia of right foot 02/07/2016  . Lactose intolerance 01/07/2016  . Inappropriate sinus tachycardia 06/29/2015  . Thoracic back pain 11/02/2013  . Type 1 diabetes mellitus not at goal [facility]   . Goiter   . Obesity   . Hypercholesterolemia   . ADHD (attention deficit hyperactivity disorder)   . Fatigue   . Tachycardia   . Autonomic neuropathy due to diabetes (HCC)   . Hypertension   . Uncontrolled DM with  microalbuminuria or microproteinuria   . Hypoglycemia associated with diabetes (HCC)   . DIABETES MELLITUS, I 12/24/2006  . ATTENTION DEFICIT, W/HYPERACTIVITY 12/24/2006    Past Surgical History:  Procedure Laterality Date  . none    . WISDOM TOOTH EXTRACTION         Home Medications    Prior to Admission medications   Medication Sig Start Date End Date Taking? Authorizing Provider  atorvastatin (LIPITOR) 20 MG tablet TAKE 1.5 TABLETS BY MOUTH DAILY 01/19/17  Yes David Stall, MD  HUMALOG 100 UNIT/ML injection INJECT 300 UNITS VIA INSULIN PUMP EVERY 48 TO 72 HOURS PER HYPERGLYCEMIA AND DKA PROTOCOLS 02/02/17  Yes David Stall, MD  losartan (COZAAR) 50 MG tablet TAKE 1 TABLET(50 MG) BY MOUTH DAILY 12/08/16  Yes Kozlow, Alvira Philips, MD  promethazine (PHENERGAN) 25 MG tablet Take 25 mg by mouth every 6 (six) hours as needed for nausea/vomiting. 02/17/17  Yes [provider]  AUVI-Q 0.3 MG/0.3ML SOAJ injection Use as directed for life-threatening allergic reaction. Patient not taking: Reported on 12/29/2016 08/13/16   Jessica Priest, MD  famotidine (PEPCID) 20 MG tablet Take 1 tablet (20 mg total) by mouth daily. Patient not taking: Reported on 12/29/2016 08/13/16   Kozlow, Alvira Philips, MD  ONE TOUCH ULTRA TEST test strip TEST BLOOD SUGAR 6 TIMES DAILY. Patient not taking: Reported on 01/29/2017 01/01/17   David Stall,  MD    Family History Family History  Problem Relation Age of Onset  . Dementia Paternal Aunt   . Schizophrenia Paternal Aunt   . Cancer Maternal Grandmother   . Diabetes Maternal Grandmother     T2 DM  . Cancer Maternal Grandfather   . Thyroid disease Neg Hx     Social History Social History  Substance Use Topics  . Smoking status: Never Smoker  . Smokeless tobacco: Never Used  . Alcohol use Not on file     Allergies   Patient has no known allergies.   Review of Systems Review of Systems  All other systems reviewed and are  negative.    Physical Exam Updated Vital Signs BP (!) 126/91   Pulse (!) 101   Temp 98.2 F (36.8 C) (Oral)   Resp 18   Ht 6' (1.829 m)   Wt 249 lb (112.9 kg)   SpO2 99%   BMI 33.77 kg/m   Physical Exam  Constitutional: He is oriented to person, place, and time. He appears well-developed and well-nourished. No distress.  HENT:  Head: Normocephalic and atraumatic.  Right Ear: External ear normal.  Left Ear: External ear normal.  Eyes: Conjunctivae and EOM are normal. Pupils are equal, round, and reactive to light.  Neck: Normal range of motion and phonation normal. Neck supple.  Cardiovascular: Normal rate, regular rhythm and normal heart sounds.   Pulmonary/Chest: Effort normal and breath sounds normal. No respiratory distress. He exhibits no tenderness and no bony tenderness.  Abdominal: Soft. There is no tenderness.  Musculoskeletal: Normal range of motion.  Neurological: He is alert and oriented to person, place, and time. No cranial nerve deficit or sensory deficit. He exhibits normal muscle tone. Coordination normal.  Skin: Skin is warm, dry and intact.  Psychiatric: He has a normal mood and affect. His behavior is normal. Judgment and thought content normal.  Nursing note and vitals reviewed.    ED Treatments / Results  Labs (all labs ordered are listed, but only abnormal results are displayed) Labs Reviewed  COMPREHENSIVE METABOLIC PANEL - Abnormal; Notable for the following:       Result Value   Glucose, Bld 270 (*)    Total Bilirubin 1.3 (*)    All other components within normal limits  CBC WITH DIFFERENTIAL/PLATELET  BRAIN NATRIURETIC PEPTIDE  D-DIMER, QUANTITATIVE (NOT AT [facility])  Rosezena Sensor, ED    EKG  EKG Interpretation  Date/Time:  Saturday Feb 28 2017 16:11:52 EDT Ventricular Rate:  102 PR Interval:    QRS Duration: 107 QT Interval:  350 QTC Calculation: 456 R Axis:   -17 Text Interpretation:  Sinus tachycardia Borderline left axis  deviation Low voltage, precordial leads Borderline T wave abnormalities since last tracing no significant change Confirmed by Mancel Bale [id number]) on 02/28/2017 6:25:50 PM       Radiology Dg Chest 2 View  Result Date: 02/28/2017 CLINICAL DATA:  Shortness of breath, chest pain. EXAM: CHEST  2 VIEW COMPARISON:  Radiographs of October 23, 2008. FINDINGS: The heart size and mediastinal contours are within normal limits. Both lungs are clear. No pneumothorax or pleural effusion is noted. The visualized skeletal structures are unremarkable. IMPRESSION: No active cardiopulmonary disease. Electronically Signed   By: Lupita Raider, M.D.   On: 02/28/2017 13:44    Procedures Procedures (including critical care time)  Medications Ordered in ED Medications - No data to display   Initial Impression / Assessment and Plan / ED Course  I have reviewed the triage vital signs and the nursing notes.  Pertinent labs & imaging results that were available during my care of the patient were reviewed by me and considered in my medical decision making (see chart for details).     Nursing Notes Reviewed/ Care Coordinated Applicable Imaging Reviewed Interpretation of Laboratory Data incorporated into ED treatment  The patient appears reasonably screened and/or stabilized for discharge and I doubt any other medical condition or other [facility] requiring further screening, evaluation, or treatment in the ED at this time prior to discharge.  Plan: Home Medications-APAP for pain continue regular; Home Treatments-rest, heat; return here if the recommended treatment, does not improve the symptoms; Recommended follow up-PCP as needed   Final Clinical Impressions(s) / ED Diagnoses   Final diagnoses:  None    New Prescriptions New Prescriptions   No medications on file     Mancel BaleWentz, Jaisa Defino, MD 02/28/17 1836

## 2017-03-04 ENCOUNTER — Telehealth (INDEPENDENT_AMBULATORY_CARE_PROVIDER_SITE_OTHER): Payer: Self-pay | Admitting: "Endocrinology

## 2017-03-04 NOTE — Telephone Encounter (Signed)
°  Who's calling (name and relationship to patient) : [name] (patient)  Best contact number: [phone]  Provider they see: Fransico MichaelBrennan  Reason for call: Patient stated he has been sick for about 3 weeks and wants Dr Fransico MichaelBrennan to call.     PRESCRIPTION REFILL ONLY  Name of prescription:  Pharmacy:

## 2017-03-04 NOTE — Telephone Encounter (Signed)
1. [name] called. 2. Subjective:   A. He is still sick. He still has nausea. He still has cramping of his arms, legs, and chest.  B. He saw a doctor at an urgent care site on 02/28/17. He had difficulty breathing at the time. He was then [facility], where he was examined, blood was taken, and a CXR was done. No specific diagnosis was made.  He saw the same UC doctor on 03/02/17. The doctor did not give [name] aspecific diagnosis, but treated him with an antibiotic, amoxicillin-clavulanate, 875 mg, twice daily, for one week.   Darrold Span. [name] feels somewhat better today. He still has cramping all over, nausea, stomach cramps, tightness in his chest, and jitteriness. His cough is better. His nasal congestion is better. His head does not feel as cloudy.   3. Objective: CXR on 02/28/17 was negative. CMP, D-dimer, BNP, Troponin, and CMP normal.  4. Assessment: This illness sounds like a mycoplasma infection. If so, he is on the proper antibiotic. It also sounds as if he may be improving, albeit slowly. 5. Plan: Push fluids, soups, and carbs. Use his pump to control his BGs. If he is not feeling better by tomorrow morning, I will fit him in to see me at 3 PM tomorrow afternoon.  Molli KnockMichael Brennan, MD, CDE

## 2017-03-04 NOTE — Telephone Encounter (Signed)
Routed to provider

## 2017-03-05 ENCOUNTER — Ambulatory Visit (INDEPENDENT_AMBULATORY_CARE_PROVIDER_SITE_OTHER): Payer: Commercial Managed Care - HMO | Admitting: "Endocrinology

## 2017-03-05 ENCOUNTER — Encounter (INDEPENDENT_AMBULATORY_CARE_PROVIDER_SITE_OTHER): Payer: Self-pay | Admitting: "Endocrinology

## 2017-03-05 VITALS — BP 120/74 | HR 100 | Wt 242.6 lb

## 2017-03-05 DIAGNOSIS — H8113 Benign paroxysmal vertigo, bilateral: Secondary | ICD-10-CM | POA: Diagnosis not present

## 2017-03-05 DIAGNOSIS — E1065 Type 1 diabetes mellitus with hyperglycemia: Secondary | ICD-10-CM | POA: Diagnosis not present

## 2017-03-05 DIAGNOSIS — R110 Nausea: Secondary | ICD-10-CM | POA: Diagnosis not present

## 2017-03-05 DIAGNOSIS — J069 Acute upper respiratory infection, unspecified: Secondary | ICD-10-CM

## 2017-03-05 DIAGNOSIS — M940 Chondrocostal junction syndrome [Tietze]: Secondary | ICD-10-CM | POA: Diagnosis not present

## 2017-03-05 DIAGNOSIS — IMO0001 Reserved for inherently not codable concepts without codable children: Secondary | ICD-10-CM

## 2017-03-05 LAB — POCT GLUCOSE (DEVICE FOR HOME USE): POC Glucose: 241 mg/dl — AB (ref 70–99)

## 2017-03-05 LAB — POCT GLYCOSYLATED HEMOGLOBIN (HGB A1C): HEMOGLOBIN A1C: 7.3

## 2017-03-05 NOTE — Patient Instructions (Addendum)
Follow up 03/27/17: Patient is excused from work during the period 02/17/17-03/06/17. He may need to be out of work next week as well.

## 2017-03-05 NOTE — Progress Notes (Signed)
CC: URI symptoms, chest tightness, cough, nausea, stomach cramps, muscle cramps, in setting of T1DM  Subjective:   1. [name] called me last night about these problems. He had seen a doctor in urgent care on 02/28/17 and was sent to the ED at [facility] for further evaluation. Lab work and CXR were negative. He was then seen again at urgent care on 03/02/17 and started on amoxicillin-clavulanate. 875 mg, twice daily for one week.   2. By yesterday evening he was feeling somewhat better, but was still having symptoms. I told him that I would fit him in today if he desired. He subsequently called and requested to be seen.  3. He feels better, pretty normal for the most part, but just moving around enough today to drive to this visit and walk into the clinic really tired him out. Nasal congestion is better. The foggy feeling in his head is better. His spinning dizziness has not been very active today. His cough has markedly decreased. He has some left lateral chest discomfort today, but much less than in the past few days. He still has nausea, but not as bad. He is still having some stomach cramping in the LUQ and RUQ. He is still constipated. The cramps in the muscles of his arms and legs have markedly decreased. He has some residual tightness in his right posterolateral calf.   4. PROS: As above Eyes: Vision is good. There are no significant eye complaints. Neck: The patient has some anterior neck tightness today.   Heart: He feels that his HR has been faster for the past several days. The patient has no complaints of palpitations or irregular heart beats. Gastrointestinal: As above.  Legs: As above. Muscle mass and strength seem normal. There are no complaints of numbness, tingling, or burning. No edema is noted. Feet: There are no obvious foot problems. There are no complaints of numbness, tingling, burning, or pain. No edema is noted.  Social history: 1. Dr. Guerry Bruin will be his new internist. 2. Dr.  Piedad Climes will see him next month in his [facility] office in [place]. .    Objective:  BP: 120/74     HR: 100     Weight 242 lbs and 9.4 ounces       Constitutional: The patient appears tired and mildly ill.  He is not coughing today. Face: The face appears normal.  Eyes: There is no obvious arcus or proptosis. Eyes are somewhat dry.  Mouth: The oropharynx and tongue appear normal. Mouth is somewhat dry.  Neck: The neck appears to be visibly normal. No carotid bruits are noted. The thyroid gland is mildly enlarged at about 22 grams in size.  The consistency of the thyroid gland is normal. The thyroid gland is tender to palpation in the right mid-lobe.  Lungs: The lungs are clear to auscultation. Air movement is good. Heart: Heart rate and rhythm are regular. Heart sounds S1 and S2 are normal. I did not appreciate any pathologic cardiac murmurs. Chest: He has point tenderness of several costo-chondral junctions of the right anterior chest, left anterior chest, lateral chest bilaterally, and of the lower anterior junctions just superior to the LUQ and RUQ. These areas are the areas that he has been complaining about when he complains of chest pain of upper abdominal pin. Abdomen: The abdomen is enlarged. Bowel sounds are normal. There is no obvious hepatomegaly, splenomegaly, or other mass effect. Abdomen is not tender.  Arms: Muscle size and bulk  are normal for age. Hands: There is no obvious tremor. Phalangeal and metacarpophalangeal joints are normal. Palmar muscles are normal. Palmar skin is normal. Palmar moisture is also normal. Legs: Muscles appear normal for age. No edema is present. He has some mild tenderness of his right posterolateral calf.  Neurologic: Strength is normal for age in both the upper and lower extremities. Muscle tone is normal. Sensation to touch is normal in both legs.     LAB DATA:  Results for orders placed or performed in visit on 03/05/17 (from the  past 504 hour(s))  POCT Glucose (Device for Home Use)   Collection Time: 03/05/17  2:40 PM  Result Value Ref Range   Glucose Fasting, POC  70 - 99 mg/dL   POC Glucose 409 (A) 70 - 99 mg/dl  POCT HgB W1X   Collection Time: 03/05/17  2:47 PM  Result Value Ref Range   Hemoglobin A1C 7.3   Results for orders placed or performed during the hospital encounter of 02/28/17 (from the past 504 hour(s))  CBC with Differential   Collection Time: 02/28/17  1:20 PM  Result Value Ref Range   WBC 10.0 4.0 - 10.5 K/uL   RBC 5.66 4.22 - 5.81 MIL/uL   Hemoglobin 16.8 13.0 - 17.0 g/dL   HCT 91.4 78.2 - 95.6 %   MCV 85.2 78.0 - 100.0 fL   MCH 29.7 26.0 - 34.0 pg   MCHC 34.9 30.0 - 36.0 g/dL   RDW 21.3 08.6 - 57.8 %   Platelets 280 150 - 400 K/uL   Neutrophils Relative % 75 %   Neutro Abs 7.6 1.7 - 7.7 K/uL   Lymphocytes Relative 15 %   Lymphs Abs 1.5 0.7 - 4.0 K/uL   Monocytes Relative 8 %   Monocytes Absolute 0.8 0.1 - 1.0 K/uL   Eosinophils Relative 1 %   Eosinophils Absolute 0.1 0.0 - 0.7 K/uL   Basophils Relative 1 %   Basophils Absolute 0.1 0.0 - 0.1 K/uL  Comprehensive metabolic panel   Collection Time: 02/28/17  1:20 PM  Result Value Ref Range   Sodium 136 135 - 145 mmol/L   Potassium 4.0 3.5 - 5.1 mmol/L   Chloride 101 101 - 111 mmol/L   CO2 25 22 - 32 mmol/L   Glucose, Bld 270 (H) 65 - 99 mg/dL   BUN 9 6 - 20 mg/dL   Creatinine, Ser 4.69 0.61 - 1.24 mg/dL   Calcium 9.3 8.9 - 62.9 mg/dL   Total Protein 6.9 6.5 - 8.1 g/dL   Albumin 4.3 3.5 - 5.0 g/dL   AST 20 15 - 41 U/L   ALT 31 17 - 63 U/L   Alkaline Phosphatase 65 38 - 126 U/L   Total Bilirubin 1.3 (H) 0.3 - 1.2 mg/dL   GFR calc non Af Amer >60 >60 mL/min   GFR calc Af Amer >60 >60 mL/min   Anion gap 10 5 - 15  I-Stat Troponin, ED (not at [facility])   Collection Time: 02/28/17  1:50 PM  Result Value Ref Range   Troponin i, poc 0.00 0.00 - 0.08 ng/mL   Comment 3          Brain natriuretic peptide   Collection Time: 02/28/17   4:08 PM  Result Value Ref Range   B Natriuretic Peptide 9.4 0.0 - 100.0 pg/mL  D-dimer, quantitative   Collection Time: 02/28/17  4:08 PM  Result Value Ref Range   D-Dimer, Quant <0.27 0.00 -  0.50 ug/mL-FEU  Results for orders placed or performed in visit on 02/20/17 (from the past 504 hour(s))  POCT Glucose (Device for Home Use)   Collection Time: 02/20/17  9:16 AM  Result Value Ref Range   Glucose Fasting, POC 180 (A) 70 - 99 mg/dL   POC Glucose  70 - 99 mg/dl   Labs 1/61/095/10/18: [id number] 0.9%7.3%.   Labs 02/28/17: As above    Assessment and Plan:   ASSESSMENT:  1. URI/cough, fatigue/nasal congestion: He appears to have either a viral process or a mycoplasmal process. The duration of symptoms favors the latter. He is slowly improving, but may not be able to return to work for another week.  2. Chest pains: He is having costo-chondritis due to the coughing that he has had during this illness.  3. Dizziness: His symptoms sound like URI-related vertigo. These symptoms are resolving.  4. Constipation/dehydration: He is somewhat dehydrated. He needs more fluids.  5. Nausea: He appears to have some residual gastritis/dyspepsia.  6-7. T1DM/hypoglycemia: His BGs are better despite his recent illness. His lowest BG was 71.  PLAN:  1. Diagnostic: None 2. Therapeutic: Finish antibiotics. Push fluids. Maalox or Mylanta 3-4 times per day.  3. Patient education: We discussed all of the above.  4. Follow-up: As planned  Level of Service: This visit lasted in excess of 55 minutes. More than 50% of the visit was devoted to counseling.   Molli KnockMichael Bertina Guthridge, MD, CDE Adult and Pediatric Endocrinology 03/05/2017 3:41 PM

## 2017-03-06 DIAGNOSIS — M940 Chondrocostal junction syndrome [Tietze]: Secondary | ICD-10-CM | POA: Insufficient documentation

## 2017-03-06 DIAGNOSIS — J069 Acute upper respiratory infection, unspecified: Secondary | ICD-10-CM | POA: Insufficient documentation

## 2017-03-09 ENCOUNTER — Other Ambulatory Visit: Payer: Self-pay | Admitting: "Endocrinology

## 2017-03-09 DIAGNOSIS — E782 Mixed hyperlipidemia: Secondary | ICD-10-CM

## 2017-03-27 ENCOUNTER — Encounter (INDEPENDENT_AMBULATORY_CARE_PROVIDER_SITE_OTHER): Payer: Self-pay | Admitting: "Endocrinology

## 2017-03-27 ENCOUNTER — Ambulatory Visit (INDEPENDENT_AMBULATORY_CARE_PROVIDER_SITE_OTHER): Payer: Commercial Managed Care - HMO | Admitting: "Endocrinology

## 2017-03-27 VITALS — BP 112/70 | HR 76 | Wt 242.4 lb

## 2017-03-27 DIAGNOSIS — R05 Cough: Secondary | ICD-10-CM | POA: Diagnosis not present

## 2017-03-27 DIAGNOSIS — IMO0001 Reserved for inherently not codable concepts without codable children: Secondary | ICD-10-CM

## 2017-03-27 DIAGNOSIS — E10649 Type 1 diabetes mellitus with hypoglycemia without coma: Secondary | ICD-10-CM | POA: Diagnosis not present

## 2017-03-27 DIAGNOSIS — E1065 Type 1 diabetes mellitus with hyperglycemia: Secondary | ICD-10-CM

## 2017-03-27 DIAGNOSIS — M940 Chondrocostal junction syndrome [Tietze]: Secondary | ICD-10-CM | POA: Diagnosis not present

## 2017-03-27 DIAGNOSIS — H8110 Benign paroxysmal vertigo, unspecified ear: Secondary | ICD-10-CM | POA: Diagnosis not present

## 2017-03-27 DIAGNOSIS — R059 Cough, unspecified: Secondary | ICD-10-CM

## 2017-03-27 LAB — POCT GLUCOSE (DEVICE FOR HOME USE): POC GLUCOSE: 202 mg/dL — AB (ref 70–99)

## 2017-03-27 NOTE — Progress Notes (Signed)
CC: URI symptoms, chest tightness, cough, nausea, stomach cramps, muscle cramps, in setting of T1DM  [name]'s last PS visit occurred on 03/05/17. Because at the time it was not clear if he would be able to see Dr. Wylene Simmer as his new PCP, I scheduled today's visit for follow up of his URI/cough.   Subjective:   1. [name] felt better on his first course of antibiotics. He then had a relapse about 2-3 days after finishing the antibiotics.  2. He saw Dr. Wylene Simmer on 03/20/17. Dr. Wylene Simmer prescribed SMP/TMZ and Claritin. On 03/22/17 he had nausea, vomiting, and jitteriness, leg cramps, and cold sweats. BGs were not high or low at that time. He stopped the antibiotic and the Claritin on 03/22/17. He called Dr. Wylene Simmer who agreed with stopping the antibiotic. [name] felt better for several days, but now feels worse.   3. Dr. Wylene Simmer wants him to see an allergist, but [name] did see Dr. Sharyn Lull last October and November, who felt that his symptoms at the time were due to lisinopril. [name] also stopped his losartan to see if that medication was causing some of his problems.   4. In the past 3 days he has felt better in his body, but worse in his head. He has had more irritation, tiredness, and discomfort in his eyes, more pressure in his ears, more spinning dizziness, more coughing, more chest wall tenderness, more light sensitivity, and more headaches. Leg cramps have resolved.   4. PROS: As above Eyes: Vision is better today.  There are no significant eye complaints. Neck: The patient has some anterior neck tightness and some tenderness of his costochondral junctions.   Heart: He feels that his HR has been faster for the past several days. The patient has no complaints of palpitations or irregular heart beats. Gastrointestinal: As above.  Legs: As above.  Muscle mass and strength seem normal. There are no complaints of numbness, tingling, or burning. No edema is noted. Feet: There are no obvious foot problems. There are no  complaints of numbness, tingling, burning, or pain. No edema is noted.  5. BG printout: He changes his sites every 3-4 days. Average BG was 175. His sites go bad at the end of the 3rd days.   6. CGM printout: He has been in auto mode 88% of the time. Average skin glucose (SG) was 157.   Social history: 1. Dr. Guerry Bruin is his new internist. 2. Dr. Piedad Climes will see him next week in his [facility] office in [facility] for evaluation of adult ADHD.    Objective:  BP: 112/70     HR: 76     Weight 242 lbs and 6.4 ounces       Constitutional: The patient appears tired and mildly ill.  He is not coughing much today. Face: The face appears normal except for his swollen eye lids.   Eyes: There is no obvious arcus or proptosis. Eyes are somewhat dry. He has significant conjunctival redness, but no exudates. Maxillary sinuses are not tender to percussion.  Ears: TMs are retracted bilaterally.  Mouth: The oropharynx shows some mild erythema of the tonsillar pillars. Tongue appears normal. Mouth is somewhat dry.  Neck: The neck appears to be visibly normal. No carotid bruits are noted. The thyroid gland is mildly enlarged at about 22 grams in size.  The consistency of the thyroid gland is normal. The thyroid gland is quite tender to palpation in the right mid-lobe, but also mildly tender  on the left.  Chest: He has point tenderness of several  costo-chondral junctions bilaterally.  Lungs: The lungs are clear to auscultation. Air movement is good. Heart: Heart rate and rhythm are regular. Heart sounds S1 and S2 are normal. I did not appreciate any pathologic cardiac murmurs. Abdomen: The abdomen is enlarged. Bowel sounds are normal. There is no obvious hepatomegaly, splenomegaly, or other mass effect. Abdomen is not tender.  Arms: Muscle size and bulk are normal for age. Hands: There is no obvious tremor. Phalangeal and metacarpophalangeal joints are normal. Palmar muscles are normal.  Palmar skin is normal. Palmar moisture is also normal. Legs: Muscles appear normal for age. No edema is present. He has no tenderness of his thigh and calf muscles.   Neurologic: Strength is normal for age in both the upper and lower extremities. Muscle tone is normal. Sensation to touch is normal in both legs.     LAB DATA:  Results for orders placed or performed in visit on 03/27/17 (from the past 504 hour(s))  POCT Glucose (Device for Home Use)   Collection Time: 03/27/17 11:00 AM  Result Value Ref Range   Glucose Fasting, POC  70 - 99 mg/dL   POC Glucose 409202 (A) 70 - 99 mg/dl    Labs 8/11/916/01/18: CBG 478202  Labs 03/05/17: HbA1c 7.3%.   Labs 02/28/17: As above    Assessment and Plan:   ASSESSMENT:  1. URI/cough, fatigue/nasal congestion: He appears to have either a viral process, a mycoplasmal process, and/or an allergic/sensitivity process going on.  2. Chest pains: He is having costo-chondritis due to the coughing that he has had during this illness.  3. Dizziness: His symptoms sound like URI-related or allergy-related vertigo. These symptoms have been worse again recently.  4. Nausea: The nausea is worse when he is dizzy.   6-7. T1DM/hypoglycemia: His BGs are better despite his recent illness. When his sites are working his BGs can be in the 100-141 range. He did have several SGs down to the 50s.   PLAN:  1. Diagnostic: None 2. Therapeutic: Push fluids. Take Maalox or Mylanta 3-4 times per day when needed. Take meclizine, 25 mg tablets, take one tablet 2-4 times daily. Start Zyrtec. Try the neti pot and/or hot showers. Change sites every 3 days.  3. Patient education: We discussed all of the above.  4. Follow-up: 2 weeks  Level of Service: This visit lasted in excess of 55 minutes. More than 50% of the visit was devoted to counseling.   Molli KnockMichael Adrieanna Boteler, MD, CDE Adult and Pediatric Endocrinology 03/27/2017 11:27 AM

## 2017-03-27 NOTE — Patient Instructions (Signed)
Followup visit in 2 weeks. 

## 2017-03-29 DIAGNOSIS — H8110 Benign paroxysmal vertigo, unspecified ear: Secondary | ICD-10-CM | POA: Insufficient documentation

## 2017-04-07 ENCOUNTER — Other Ambulatory Visit: Payer: Self-pay | Admitting: "Endocrinology

## 2017-04-07 DIAGNOSIS — IMO0001 Reserved for inherently not codable concepts without codable children: Secondary | ICD-10-CM

## 2017-04-07 DIAGNOSIS — E1065 Type 1 diabetes mellitus with hyperglycemia: Principal | ICD-10-CM

## 2017-04-08 ENCOUNTER — Ambulatory Visit (INDEPENDENT_AMBULATORY_CARE_PROVIDER_SITE_OTHER): Payer: Commercial Managed Care - HMO | Admitting: Allergy

## 2017-04-08 ENCOUNTER — Ambulatory Visit (HOSPITAL_COMMUNITY)
Admission: EM | Admit: 2017-04-08 | Discharge: 2017-04-08 | Disposition: A | Discharge status: Home / Self Care | Payer: 59 | Attending: Internal Medicine | Admitting: Internal Medicine

## 2017-04-08 ENCOUNTER — Encounter (HOSPITAL_COMMUNITY): Payer: Self-pay | Admitting: Emergency Medicine

## 2017-04-08 ENCOUNTER — Encounter: Payer: Self-pay | Admitting: Allergy

## 2017-04-08 VITALS — BP 114/72 | HR 88 | Resp 20

## 2017-04-08 DIAGNOSIS — T887XXA Unspecified adverse effect of drug or medicament, initial encounter: Secondary | ICD-10-CM

## 2017-04-08 DIAGNOSIS — H1013 Acute atopic conjunctivitis, bilateral: Secondary | ICD-10-CM | POA: Diagnosis not present

## 2017-04-08 DIAGNOSIS — R42 Dizziness and giddiness: Secondary | ICD-10-CM

## 2017-04-08 DIAGNOSIS — H6983 Other specified disorders of Eustachian tube, bilateral: Secondary | ICD-10-CM | POA: Diagnosis not present

## 2017-04-08 DIAGNOSIS — T50905A Adverse effect of unspecified drugs, medicaments and biological substances, initial encounter: Secondary | ICD-10-CM

## 2017-04-08 DIAGNOSIS — Z91018 Allergy to other foods: Secondary | ICD-10-CM | POA: Diagnosis not present

## 2017-04-08 LAB — GLUCOSE, CAPILLARY: GLUCOSE-CAPILLARY: 105 mg/dL — AB (ref 65–99)

## 2017-04-08 MED ORDER — OLOPATADINE HCL 0.2 % OP SOLN: 1.0000 [drp] | Freq: Every day | OPHTHALMIC | 3 refills | Status: DC

## 2017-04-08 MED ORDER — FLUTICASONE PROPIONATE 93 MCG/ACT NA EXHU: 2.0000 | INHALANT_SUSPENSION | Freq: Two times a day (BID) | NASAL | 3 refills | Status: DC | PRN

## 2017-04-08 NOTE — Discharge Instructions (Signed)
Keep an close eye on Sx. If symptoms return go to ED immediately. Call and consult PCP before taking next Flonase dose.  FU PCP x 24 hrs.

## 2017-04-08 NOTE — Progress Notes (Signed)
Follow-up Note  RE: Martrell Rummler MRN: [id number] DOB: 02/13/1981 Date of Office Visit: 04/08/2017   History of present illness: [name] is a 36 y.o. male presenting today for issues with his ears.  He was last seen in the office on 09/03/16 by Dr. Lucie Leather for food allergy, angioedema 2/2 ACEI and atopic conjunctivitis.    He reports 2 months ago he started getting sick which he thought was viral related and he was treated with antibiotics where he reports he had symptoms of vertigo.  He reports his GP and his endocrinologist thought he had 'swelling of the tube between his ear and nose'.  He reports symptoms are worse on left ear where he has more pressure and pain in the sinus.  His right ear does have ringing and "feels like a tuning fork".  He has been using saline rinse, flonase 1 spray each side and a zantac.  Also feels his eye is itchy and irritated.    After his last visit he was advised to avoid Ace inhibitors and he has come off of his antihistamine regimen of H1 and H2 blockers. He reports he has not had any further reactions. He continues to avoid mussels.    Review of systems: Review of Systems  Constitutional: Negative for chills, fever and malaise/fatigue.  HENT: Positive for ear pain, sinus pain and tinnitus. Negative for congestion, ear discharge, hearing loss, nosebleeds and sore throat.   Eyes: Negative for pain, discharge and redness.  Respiratory: Negative for cough, shortness of breath and wheezing.   Cardiovascular: Negative for chest pain.  Gastrointestinal: Negative for abdominal pain, diarrhea, nausea and vomiting.  Musculoskeletal: Negative for joint pain and myalgias.  Skin: Negative for itching and rash.  Neurological: Negative for headaches.    All other systems negative unless noted above in HPI  Past medical/social/surgical/family history have been reviewed and are unchanged unless specifically indicated below.  No  changes  Medication List: Allergies as of 04/08/2017   No Known Allergies     Medication List       Accurate as of 04/08/17  3:42 PM. Always use your most recent med list.          atorvastatin 20 MG tablet Commonly known as:  LIPITOR TAKE 1 AND 1/2 TABLETS BY MOUTH DAILY   AUVI-Q 0.3 mg/0.3 mL Soaj injection Generic drug:  EPINEPHrine Use as directed for life-threatening allergic reaction.   buPROPion 150 MG 24 hr tablet Commonly known as:  WELLBUTRIN XL   famotidine 20 MG tablet Commonly known as:  PEPCID Take 1 tablet (20 mg total) by mouth daily.   HUMALOG 100 UNIT/ML injection Generic drug:  insulin lispro INJECT 300 UNITS VIA INSULIN PUMP EVERY 48 TO 72 HOURS PER HYPERGLYCEMIA AND DKA PROTOCOLS   losartan 50 MG tablet Commonly known as:  COZAAR TAKE 1 TABLET(50 MG) BY MOUTH DAILY   ONE TOUCH ULTRA TEST test strip Generic drug:  glucose blood TEST BLOOD SUGAR 6 TIMES DAILY.   traZODone 50 MG tablet Commonly known as:  DESYREL TK 1-2 TS PO NIGHTLY       Known medication allergies: No Known Allergies   Physical examination: Blood pressure 114/72, pulse 88, resp. rate 20.  General: Alert, interactive, in no acute distress. HEENT: Left TM is clear, right TM has visible fluid without any erythema or bulging,  turbinates mildly edematous without discharge, post-pharynx non erythematous. Neck: Supple without lymphadenopathy. Lungs: Clear to auscultation without wheezing, rhonchi or  rales. {no increased work of breathing. CV: Normal S1, S2 without murmurs. Abdomen: Nondistended, nontender. Skin: Warm and dry, without lesions or rashes. Extremities:  No clubbing, cyanosis or edema. Neuro:   Grossly intact.  Diagnositics/Labs: None today  Assessment and plan:   Eustachian tube dysfunction     - will have you try XHance which is Flonase that provides better deposition of the medication into the sinus passages.  Use 1-2 sprays each nostril twice a day.        - use Xyzal 5mg  daily     - will try to avoid use of prednisone due to history of diabetes     - if above does not help ear symptoms recommend you see ENT   Atopic conjunctivitis     - use pataday 1 drop each eye as needed daily for itchy watery red eyes     - xyzal as above   food allergy     - Continue avoidance of mussels with AuviQ when necessary in case of allergic reaction   Follow-up 1 year or sooner if needed   I appreciate the opportunity to take part in [place] care. Please do not hesitate to contact me with questions.  Sincerely,   Margo Aye, MD Allergy/Immunology [facility]

## 2017-04-08 NOTE — Patient Instructions (Addendum)
Eustachian tube dysfunction     - will have you try XHance which is Flonase that provides better deposition of the medication into the sinus passages.  Use 1-2 sprays each nostril twice a day.       - use Xyzal 5mg  daily     - will try to avoid use of prednisone due to history of diabetes     - if above does not help ear symptoms recommend you see ENT   Atopic conjunctivitis     - use pataday 1 drop each eye as needed daily for itchy watery red eyes     - xyzal as above   Follow-up 1 year or sooner if needed

## 2017-04-08 NOTE — ED Triage Notes (Signed)
[facility] a complaint of a sudden episode of dizziness , chest soreness and flushness after taking Flonase about 1 hour prior.

## 2017-04-08 NOTE — ED Provider Notes (Signed)
CSN: [id number]     Arrival date & time 04/08/17  1714 History   None    Chief Complaint  Patient presents with  . Medication Reaction   (Consider location/radiation/quality/duration/timing/severity/associated sxs/prior Treatment) The history is provided by the patient.  : 36 Y/O male with PMH of DM (on Insulin pump), depression presented to clinic with CC of chest tightness,dizziness post Flonase nasal spray. Pt reports suffering from dizziness x 2 months and PCP advise to take flonase. Started 2 weeks ago, 1 spray Q nostril. states today took 2 spray each nostril and Sx started since then. FS: 105. EKG: Sinus Tachy with RBBB. BBB is Not a new findings. Comparison done with previous EKG and BBB was noted.Pt currently asymptomatic. Denies CP, SOB, palpitations, N/V. Skin dry and warm.. No diaphoresis. Reports feel light headed. Neurologically Intact. Speech clear. Exam WNL. No other acute findings noted.   Past Medical History:  Diagnosis Date  . ADHD (attention deficit hyperactivity disorder)   . Autonomic neuropathy due to diabetes (HCC)   . Diabetes (HCC)   . Fatigue   . Goiter   . High blood pressure   . High cholesterol   . Hypercholesterolemia   . Hypertension   . Hypoglycemia associated with diabetes (HCC)   . Obesity   . Tachycardia   . Type 1 diabetes mellitus not at goal [facility](HCC)   . Uncontrolled DM with microalbuminuria or microproteinuria    Past Surgical History:  Procedure Laterality Date  . none    . WISDOM TOOTH EXTRACTION     Family History  Problem Relation Age of Onset  . Dementia Paternal Aunt   . Schizophrenia Paternal Aunt   . Cancer Maternal Grandmother   . Diabetes Maternal Grandmother        T2 DM  . Cancer Maternal Grandfather   . Thyroid disease Neg Hx    Social History  Substance Use Topics  . Smoking status: Never Smoker  . Smokeless tobacco: Never Used  . Alcohol use Not on file    Review of Systems  Constitutional: Negative.   HENT:  Negative.   Eyes: Negative.   Respiratory: Negative.   Cardiovascular: Negative.   Gastrointestinal: Negative.   Neurological: Positive for light-headedness.  Psychiatric/Behavioral: Negative.     Allergies  Patient has no known allergies.  Home Medications   Prior to Admission medications   Medication Sig Start Date End Date Taking? Authorizing Provider  atorvastatin (LIPITOR) 20 MG tablet TAKE 1 AND 1/2 TABLETS BY MOUTH DAILY 03/09/17   David StallBrennan, Michael J, MD  AUVI-Q 0.3 MG/0.3ML SOAJ injection Use as directed for life-threatening allergic reaction. 08/13/16   Kozlow, Alvira PhilipsEric J, MD  buPROPion (WELLBUTRIN XL) 150 MG 24 hr tablet  03/30/17   [provider]  Fluticasone Propionate (XHANCE) 93 MCG/ACT EXHU Place 2 sprays into both nostrils 2 (two) times daily as needed. 04/08/17   Marcelyn BruinsPadgett, Shaylar Patricia, MD  HUMALOG 100 UNIT/ML injection INJECT 300 UNITS VIA INSULIN PUMP EVERY 48 TO 72 HOURS PER HYPERGLYCEMIA AND DKA PROTOCOLS 04/07/17   David StallBrennan, Michael J, MD  losartan (COZAAR) 50 MG tablet TAKE 1 TABLET(50 MG) BY MOUTH DAILY 12/08/16   Kozlow, Alvira PhilipsEric J, MD  Olopatadine HCl (PATADAY) 0.2 % SOLN Place 1 drop into both eyes daily. 04/08/17   Marcelyn BruinsPadgett, Shaylar Patricia, MD  ONE TOUCH ULTRA TEST test strip TEST BLOOD SUGAR 6 TIMES DAILY. 01/01/17   David StallBrennan, Michael J, MD  traZODone (DESYREL) 50 MG tablet TK 1-2 TS PO  NIGHTLY 03/30/17   [provider]   Meds Ordered and Administered this Visit  Medications - No data to display  BP 130/86 (BP Location: Right Arm)   Pulse (!) 110   Temp 98.3 F (36.8 C) (Oral)   Resp 20   SpO2 98%  No data found.   Physical Exam  Constitutional: He is oriented to person, place, and time. He appears well-developed and well-nourished. No distress.  HENT:  Head: Normocephalic.  Eyes: Conjunctivae and EOM are normal. Pupils are equal, round, and reactive to light.  Neck: Normal range of motion.  Cardiovascular: Regular rhythm and normal heart  sounds.   Tachycardic with HR 108 on exam. S1S2 normal. Denies CP, SOB, palpitations at the time of Exam. No acute visible distress  Pulmonary/Chest: Effort normal and breath sounds normal. No respiratory distress.  Abdominal: Soft. Bowel sounds are normal.  Neurological: He is alert and oriented to person, place, and time.  Skin: Skin is warm. Capillary refill takes less than 2 seconds.  Psychiatric: He has a normal mood and affect.    Urgent Care Course     Procedures (including critical care time)  Labs Review Labs Reviewed  GLUCOSE, CAPILLARY - Abnormal; Notable for the following:       Result Value   Glucose-Capillary 105 (*)    All other components within normal limits    Imaging Review No results found.   Visual Acuity Review  Right Eye Distance:   Left Eye Distance:   Bilateral Distance:    Right Eye Near:   Left Eye Near:    Bilateral Near:         MDM   1. Medication reaction, initial encounter   2. Dizziness and giddiness   EKG Sinus Tachy with RBBB. Comparison done with previous EKG and BBB noted in previous EKG. Pt stable and only c/o of mild lightheadedness. If symptoms return go to ED immediately. Call and consult PCP before taking next Flonase dose.  FU PCP x 24 hrs.   Porshia Blizzard, NP 04/08/17 1846

## 2017-04-09 ENCOUNTER — Other Ambulatory Visit: Payer: Self-pay

## 2017-04-09 MED ORDER — OLOPATADINE HCL 0.1 % OP SOLN: 1.0000 [drp] | Freq: Two times a day (BID) | OPHTHALMIC | 5 refills | Status: DC

## 2017-04-10 ENCOUNTER — Encounter (INDEPENDENT_AMBULATORY_CARE_PROVIDER_SITE_OTHER): Payer: Self-pay | Admitting: "Endocrinology

## 2017-04-10 ENCOUNTER — Ambulatory Visit (INDEPENDENT_AMBULATORY_CARE_PROVIDER_SITE_OTHER): Payer: Commercial Managed Care - HMO | Admitting: "Endocrinology

## 2017-04-10 ENCOUNTER — Telehealth: Payer: Self-pay

## 2017-04-10 VITALS — BP 112/70 | HR 88 | Wt 241.4 lb

## 2017-04-10 DIAGNOSIS — E049 Nontoxic goiter, unspecified: Secondary | ICD-10-CM

## 2017-04-10 DIAGNOSIS — IMO0001 Reserved for inherently not codable concepts without codable children: Secondary | ICD-10-CM

## 2017-04-10 DIAGNOSIS — R110 Nausea: Secondary | ICD-10-CM

## 2017-04-10 DIAGNOSIS — E1065 Type 1 diabetes mellitus with hyperglycemia: Secondary | ICD-10-CM | POA: Diagnosis not present

## 2017-04-10 DIAGNOSIS — J309 Allergic rhinitis, unspecified: Secondary | ICD-10-CM

## 2017-04-10 DIAGNOSIS — H8110 Benign paroxysmal vertigo, unspecified ear: Secondary | ICD-10-CM

## 2017-04-10 DIAGNOSIS — E063 Autoimmune thyroiditis: Secondary | ICD-10-CM

## 2017-04-10 DIAGNOSIS — E10649 Type 1 diabetes mellitus with hypoglycemia without coma: Secondary | ICD-10-CM | POA: Diagnosis not present

## 2017-04-10 DIAGNOSIS — E083591 Diabetes mellitus due to underlying condition with proliferative diabetic retinopathy without macular edema, right eye: Secondary | ICD-10-CM

## 2017-04-10 LAB — POCT GLUCOSE (DEVICE FOR HOME USE): Glucose Fasting, POC: 196 mg/dL — AB (ref 70–99)

## 2017-04-10 MED ORDER — ALCAFTADINE 0.25 % OP SOLN: 1.0000 [drp] | Freq: Every day | OPHTHALMIC | 3 refills | Status: DC | PRN

## 2017-04-10 NOTE — Patient Instructions (Signed)
Follow up visit in 4 weeks. Please call Dr. Fransico Vanissa Strength on Wednesday evening, 04/15/17, between 8;00-9:30 PM.

## 2017-04-10 NOTE — Telephone Encounter (Signed)
New rx sent for insurance coverage.

## 2017-04-10 NOTE — Telephone Encounter (Signed)
Pt's plan does not cover olopatadine 0.1%. An alternate requested was Lastac[place]. Okay to send? Please advise of new sig.

## 2017-04-10 NOTE — Telephone Encounter (Signed)
Ok.   [place] 1 drop each eye as needed daily for itchy/watery/red eyes

## 2017-04-10 NOTE — Progress Notes (Signed)
CC: URI symptoms, chest tightness, cough, nausea, stomach cramps, muscle cramps, in setting of T1DM  [name]'s last PS visit occurred on 03/27/17. I scheduled today's visit for follow up of his URI/cough/allergies/vertigo and his diabetes care using his new Medtronic 670G insulin pump and Guardian CGM sensor. .   Subjective:  1. [name] feels somewhat better, but he still has some  nasal congestion, congestion in his ears, vertigo, and vertigo-related nausea. He still has some cough. His eyes are also irritated frequently. He feels that the meclizine reduces his vertigo, but also causes him to have some problems concentrating.  2. When he tried to increase Flonase he became short of breath and he felt as if he would pass out. These symptoms were severe for several hours, and did not fully subside until the next day.  [name] has not taken any more Flonase since then.  3. [name] saw Dr. Sharyn LullKoslow last October and November 2017, who did allergy testing. The testing was reportedly negative. Dr. Sharyn LullKoslow felt that his symptoms at the time were due to lisinopril. [name] also stopped his losartan to see if that medication was causing some of his problems.  4. [name] saw Dr. Annitta JerseyKoslow's partner two days ago, who spent very little time with him. That physician prescribed more antihistamines. [name] is not sure if the increased antihistamines are helping him.   5. PROS: He does not feel good today. His eyes still hurt and it is difficult to focus at times. He still has the URI-like symptoms described above. He is also having some posterior calf cramps at times.  Eyes: As above. He had a high-carb early lunch on Tuesday, 04/07/17, followed by laser surgery on his right eye.  Neck: The patient has had some anterior neck tightness and tenderness intermittently, more on the left than on the right.    Heart: He feels that his HR has been faster for weeks. He checked the HR recently and it was 104. The patient has no complaints of palpitations  or irregular heart beats. Chest: He still has some costochondral pains at times, but much less frequent and severe.  Gastrointestinal: He does not have any GI complaints today.  Legs: As above.  Muscle mass and strength seem normal. There are no complaints of numbness, tingling, or burning. No edema is noted. Feet: There are no obvious foot problems. There are no complaints of numbness, tingling, burning, or pain. No edema is noted. 5. [name] saw Dr. Piedad Climesom Gualtieri about 3 weeks ago. Dr. Shane CrutchGualtieri diagnosed [name] with ADHD and major depressive disorder. He put [name] on Bupropion and trazodone. [name] will see Dr. Shane CrutchGualtieri again in another two weeks. 6. BG printout: [name] changes his sites every 2.6 days. Average BG was 165, compared with 175 at his last visit. BG range was 83-329. When his BGs spike upward, it is usually due to high carb meals and/or stress. .  7. CGM printout: He has been in auto mode 94% of the time. BGs tend to be highest about 1-2 PM. Lunch tends to be the meal with the highest carb count. BGs and SGs correlate very well. On his best days his BGs vary from 109-166.   Social history: 1. Dr. Guerry Bruinichard Tisovec is his new internist. 2. His job site put him on temporary disability. He has been working from home.   PHYSICAL EXAM:   Objective:  BP: 112/70     HR: 88     Weight 241.6 lbs  Constitutional: The patient appears tired and mildly ill, but much better than two weeks ago. He is not coughing much today. His sensorium is clear. His affect and insight are quite normal.  Face: The face appears normal except for his swollen eye lids.   Eyes: There is no obvious arcus or proptosis. Eyes are somewhat dry. He has some residual conjunctival redness, but no exudates. Maxillary sinuses are not tender to percussion.  Ears: TMs are retracted bilaterally.  Mouth: The oropharynx shows some mild erythema of the tonsillar pillars. Tongue appears normal. Mouth is somewhat dry.  Neck: The neck  appears to be visibly normal. No carotid bruits are noted. The thyroid gland is mildly enlarged at about 22 grams in size again today.  The consistency of the thyroid gland is normal. The thyroid gland is mildly tender to palpation in the right mid-lobe, but more tender in the left midlobe today.   Chest: He has point tenderness of several  costo-chondral junctions bilaterally.  Lungs: The lungs are clear to auscultation. Air movement is good. Heart: Heart rate and rhythm are regular. Heart sounds S1 and S2 are normal. I did not appreciate any pathologic cardiac murmurs. Abdomen: The abdomen is enlarged. Bowel sounds are normal. There is no obvious hepatomegaly, splenomegaly, or other mass effect. Abdomen is not tender.  Arms: Muscle size and bulk are normal for age. Hands: There is no obvious tremor. Phalangeal and metacarpophalangeal joints are normal. Palmar muscles are normal. Palmar skin is normal. Palmar moisture is also normal. Legs: Muscles appear normal for age. No edema is present. He has mild tenderness of his posterior calf muscles.   Neurologic: Strength is normal for age in both the upper and lower extremities. Muscle tone is normal. Sensation to touch is normal in both legs.     LAB DATA:  Results for orders placed or performed in visit on 04/10/17 (from the past 504 hour(s))  POCT Glucose (Device for Home Use)   Collection Time: 04/10/17  8:15 AM  Result Value Ref Range   Glucose Fasting, POC 196 (A) 70 - 99 mg/dL   POC Glucose  70 - 99 mg/dl  Results for orders placed or performed during the hospital encounter of 04/08/17 (from the past 504 hour(s))  Glucose, capillary   Collection Time: 04/08/17  5:59 PM  Result Value Ref Range   Glucose-Capillary 105 (H) 65 - 99 mg/dL  Results for orders placed or performed in visit on 03/27/17 (from the past 504 hour(s))  POCT Glucose (Device for Home Use)   Collection Time: 03/27/17 11:00 AM  Result Value Ref Range   Glucose Fasting,  POC  70 - 99 mg/dL   POC Glucose 161 (A) 70 - 99 mg/dl    Labs 0/96/04: CBG 540  Labs 04/08/17: CBG 105  Labs 03/27/17: CBG 202  Labs 03/05/17: HbA1c 7.3%.   Labs 02/28/17: As above    Assessment and Plan:   ASSESSMENT:  1. URI/cough, fatigue/nasal congestion: He appears to have an allergic/sensitivity process going on. He is symptomatically and clinically better than he was two weeks ago.  2. Chest pains: He is still having some costo-chondritis due to the coughing, but much less than at his last visit.   3-5. Dizziness/vertigo/nausea:   A. Spinning dizziness/vertigo: His symptoms sound like URI-related or allergy-related vertigo. The spinning dizziness symptoms have improved as his nasal congestion and ear fullness have improved.   B. Nausea: The nausea os less frequent and less severe. However, the  nausea is worse whenever the vertigo is bothering him more.  6-7. T1DM/hypoglycemia: His BGs are much better despite his illness. When his sites are working his BGs can be in the 100-170 range. He has not had any BGs <83. He had one SG in the high 60s and one in the 70s.   8-9: Goiter/thyroiditis: His thyroid gland is the same size overall, but the lobes have shifted somewhat in size. The tenderness of the lobes has also shifted since last visit. His thyroiditis is clinically active today.   PLAN:  1. Diagnostic: Call Dr. Fransico Milanna Kozlov next Wednesday evening to discuss BGs. 2. Therapeutic: Push fluids. Take Maalox or Mylanta 3-4 times per day when needed. Take meclizine, 25 mg tablets, one tablet 1-3 times daily as needed. Continue Xyzal. . Use the neti pot and/or hot showers. Change sites every 3 days. Increase his ICRs as follows: MN: 7 8 AM: 6 9 PM: 7 3. Patient education: We discussed all of the above.  4. Follow-up: 4 weeks  Level of Service: This visit lasted in excess of 70 minutes. More than 50% of the visit was devoted to counseling.  Molli Knock, MD, CDE Adult and Pediatric  Endocrinology 04/10/2017 8:51 AM

## 2017-04-11 DIAGNOSIS — E113591 Type 2 diabetes mellitus with proliferative diabetic retinopathy without macular edema, right eye: Secondary | ICD-10-CM | POA: Insufficient documentation

## 2017-04-14 ENCOUNTER — Encounter: Payer: Self-pay | Admitting: Sports Medicine

## 2017-04-14 ENCOUNTER — Ambulatory Visit (INDEPENDENT_AMBULATORY_CARE_PROVIDER_SITE_OTHER): Payer: 59 | Admitting: Sports Medicine

## 2017-04-14 VITALS — BP 126/80 | Ht 72.0 in | Wt 241.0 lb

## 2017-04-14 DIAGNOSIS — M791 Myalgia, unspecified site: Secondary | ICD-10-CM

## 2017-04-14 NOTE — Progress Notes (Addendum)
   Subjective:    Patient ID: [name], male    DOB: April 23, 1981, 36 y.o.   MRN: [id number]  HPI Mr. Lehnen is a 36 year old male presenting today for generalized body aches and cramps. Reports 3 month history of multiple symptoms, including generalized muscle aches, vertigo, and muscle spasms. Has been referred to ENT, with appointment tomorrow with Dr. Lucia Gaskins. Muscle pains and cramps occur all over her body, but are most prominent in his right bicep, upper back, and calves. Also notes occasional numbness in fourth and fifth digit of bilateral hands, worse when he bends his elbow. Generalized weakness, but also has occasional flares of weakness or is unable to hold anything in his hands. New since Friday, he notes muscle twitching in his chest. Denies any supplement use. Has been using meclizine for dizziness, which has been helping. Reports starting trazodone and Wellbutrin and his therapist 3 weeks ago. Also been taking atorvastatin for the last 2 years.  Note he was evaluated by Dr Tobe Sos this past week and DM is stable.  Tremor and muscle twitches have increased since that visit.  Past Hx;  Several respiratory infections this past year Retinal surgeries for DM   Review of Systems Eyes fever, changes in urine color, headaches. Does note increased sweating, denies night sweats. Notes vertigo, improved. Numbness, weakness, muscle spasms, muscle aches. Occasional shortness of breath.    Objective:   Physical Exam  Constitutional: He appears well-developed and well-nourished. No distress.  HENT:  Head: Normocephalic and atraumatic.  Cardiovascular: Normal rate.   Pulmonary/Chest: Effort normal. No respiratory distress.  Musculoskeletal:  Tenderness noted diffusely across upper back, bilateral biceps (right greater than left), forearms bilaterally, quads bilaterally, gastrocnemius bilaterally. Muscle strength 5 out of 5 in both upper and lower extremities. Diminished reflexes  noted. Tremor at rest noted in upper extremities bilaterally, right worse than left. No fasciculations noted across chest or back.      Assessment & Plan:   1. Generalized muscle ache, muscles spasms, tremor Unknown etiology. CMP, CBC, and TSH all normal last month. Will obtain ESR, CK, and CRP. If CK elevated, recommend discontinuing atorvastatin. Both trazodone and Wellbutrin adverse effects reviewed and both may contribute to myalgia, tremor, and muscle twitching-will discontinue trazodone and monitor for improvement. Consider referral to neurology for further evaluation if no improvement.   I observed and examined the patient with the resident and agree with assessment and plan.  Note reviewed and modified by me. Stefanie Libel, MD

## 2017-04-15 ENCOUNTER — Telehealth (INDEPENDENT_AMBULATORY_CARE_PROVIDER_SITE_OTHER): Payer: Self-pay | Admitting: "Endocrinology

## 2017-04-15 NOTE — Telephone Encounter (Addendum)
Received telephone call from [name] 1. Overall status: Things have been rough for several days,  A. He has had a tremor that began over the weekend. He went to Dr. Darrick PennaFields on 04/14/17, who thought the tremor might be de to trazodone. [name] stopped the trazodone.[name] feels that he is still jittery and has been having cramps and shooting pains in his arms. He has also has three episodes of sensory overload and getting very anxious and upset. Dr. Shane CrutchGualtieri said that it was fine to stop the trazodone and to call back in 3-4 days if necessary. He has not resumed taking caffeine.   B. The cough, ear popping, and vertigo are much better. He still has some sinus pressure above the bridge of his nose.  His vertigo has eased up, so he stopped the meclizine 3-4 days ago.  He did see ENT today. The ENT will schedule a CT scan because of his sinus issues and his vertigo.  2. New problems: BGs have been higher at times this week.   3. Last site change: this evening and yesterday 4. Rapid-acting insulin: Humalog in his 670G pump 5. BG log: 2 AM, Breakfast, Lunch, Supper, Bedtime 04/13/17: xxx, 135, 206/202/119, 117, 179 04/14/17: 136, 188, site change/xxx, 192, 137 04/15/17: 95/snack, 138, 190/265/upset/239/site change, 126, pending 6. Assessment: BGs are doing well considering all that's going on.  7. Plan: No changes to pump settings 8. FU call: next Wednesday evening, or earlier if needed Molli KnockMichael Dimitri Shakespeare, MD, CDE

## 2017-04-20 ENCOUNTER — Other Ambulatory Visit: Payer: Self-pay

## 2017-04-20 MED ORDER — ALCAFTADINE 0.25 % OP SOLN: 1.0000 [drp] | Freq: Every day | OPHTHALMIC | 3 refills | Status: DC | PRN

## 2017-04-24 ENCOUNTER — Other Ambulatory Visit: Payer: Self-pay | Admitting: Otolaryngology

## 2017-04-24 ENCOUNTER — Ambulatory Visit
Admission: RE | Admit: 2017-04-24 | Discharge: 2017-04-24 | Disposition: A | Discharge status: Home / Self Care | Payer: 59 | Source: Ambulatory Visit | Attending: Otolaryngology | Admitting: Otolaryngology

## 2017-04-24 DIAGNOSIS — J329 Chronic sinusitis, unspecified: Secondary | ICD-10-CM

## 2017-04-27 ENCOUNTER — Telehealth: Payer: Self-pay | Admitting: *Deleted

## 2017-04-27 DIAGNOSIS — R251 Tremor, unspecified: Secondary | ICD-10-CM

## 2017-04-27 NOTE — Telephone Encounter (Signed)
Order placed and faxed to [facility]

## 2017-04-30 ENCOUNTER — Ambulatory Visit: Payer: 59 | Admitting: Sports Medicine

## 2017-05-05 ENCOUNTER — Other Ambulatory Visit: Payer: Self-pay | Admitting: "Endocrinology

## 2017-05-05 DIAGNOSIS — E782 Mixed hyperlipidemia: Secondary | ICD-10-CM

## 2017-05-15 ENCOUNTER — Encounter (INDEPENDENT_AMBULATORY_CARE_PROVIDER_SITE_OTHER): Payer: Self-pay | Admitting: "Endocrinology

## 2017-05-15 ENCOUNTER — Ambulatory Visit (INDEPENDENT_AMBULATORY_CARE_PROVIDER_SITE_OTHER): Payer: 59 | Admitting: "Endocrinology

## 2017-05-15 VITALS — BP 120/80 | HR 82 | Wt 241.2 lb

## 2017-05-15 DIAGNOSIS — E10649 Type 1 diabetes mellitus with hypoglycemia without coma: Secondary | ICD-10-CM | POA: Diagnosis not present

## 2017-05-15 DIAGNOSIS — G4733 Obstructive sleep apnea (adult) (pediatric): Secondary | ICD-10-CM

## 2017-05-15 DIAGNOSIS — E1065 Type 1 diabetes mellitus with hyperglycemia: Secondary | ICD-10-CM | POA: Diagnosis not present

## 2017-05-15 DIAGNOSIS — K3184 Gastroparesis: Secondary | ICD-10-CM

## 2017-05-15 DIAGNOSIS — R5383 Other fatigue: Secondary | ICD-10-CM

## 2017-05-15 DIAGNOSIS — E049 Nontoxic goiter, unspecified: Secondary | ICD-10-CM | POA: Diagnosis not present

## 2017-05-15 DIAGNOSIS — E083553 Diabetes mellitus due to underlying condition with stable proliferative diabetic retinopathy, bilateral: Secondary | ICD-10-CM

## 2017-05-15 DIAGNOSIS — E069 Thyroiditis, unspecified: Secondary | ICD-10-CM | POA: Diagnosis not present

## 2017-05-15 DIAGNOSIS — IMO0001 Reserved for inherently not codable concepts without codable children: Secondary | ICD-10-CM

## 2017-05-15 DIAGNOSIS — R000 Tachycardia, unspecified: Secondary | ICD-10-CM

## 2017-05-15 DIAGNOSIS — E1043 Type 1 diabetes mellitus with diabetic autonomic (poly)neuropathy: Secondary | ICD-10-CM | POA: Diagnosis not present

## 2017-05-15 DIAGNOSIS — R140 Abdominal distension (gaseous): Secondary | ICD-10-CM

## 2017-05-15 DIAGNOSIS — E6609 Other obesity due to excess calories: Secondary | ICD-10-CM | POA: Diagnosis not present

## 2017-05-15 LAB — POCT GLUCOSE (DEVICE FOR HOME USE): GLUCOSE FASTING, POC: 209 mg/dL — AB (ref 70–99)

## 2017-05-15 MED ORDER — ONETOUCH ULTRA BLUE VI STRP: ORAL_STRIP | 3 refills | Status: DC

## 2017-05-15 NOTE — Progress Notes (Signed)
Subjective:  Patient Name: [name] Date of Birth: 01/19/1981  MRN: [id number]  [name]  presents to the office today for follow-up of his type 1 diabetes mellitus, goiter, obesity, combined hyperlipidemia, adult ADHD, fatigue, autonomic neuropathy, tachycardia, hypertension, microalbuminuria, and hypoglycemia, plus a new illness syndrome.  HISTORY OF PRESENT ILLNESS:   [name] is a 36 y.o. Caucasian gentleman. [name] was unaccompanied.  1. The patient was first referred to me on 01/08/2006 by his family physician, Dr. Roanna EpleyBert Fields, for evaluation and management of type 1 diabetes mellitus and related problems. The patient was 36 years old.  A. [name] had been diagnosed with type 1 diabetes somewhere in 2001-2002, at the age of 35-19. He was initially diagnosed with type 2 diabetes mellitus, but was later re-classified as type 1 diabetes mellitus. He had been followed by Dr. Arther Damesom O'Connell, staff [facility]ocrinologist [facility]. [name] was started on an insulin pump approximately 18 months prior to first seeing me. His pump was a Medtronic Paradigm 712. His blood glucose control was fair-to-poor. He was frequently not checking blood sugars or taking insulin boluses as often as he needed to. He frequently noted fast heart rate. The patient's past medical history was positive for hypertension, for which he was taking lisinopril, and for what his mother called "extreme ADHD". He had previously been taking ADHD medicines but had discontinued them due to adverse effects. He had prior ankle injuries and knee injuries, to include tears of the lateral menisci bilaterally. He had not had any surgeries. He was working for a [employer] company that had many different job sites in different parts of the [place] and in other countries as well. 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 He did not use tobacco or drugs, but did drink alcohol occasionally.  B. On physical  examination, his weight was 234 pounds, his height was 70 inches, his BMI was 33.2. Blood pressure was 128/70. Hemoglobin A1c was 9.5%. Heart rate was 96. He had normal affect and fair insight. He had a 25-30 gram goiter. He had normal 1+ DP pulses in his feet. He had normal sensation in his feet to touch, vibration, and monofilament. His CMP was normal except for glucose of 251. His cholesterol was 257, triglycerides 143, HDL 49, and LDL 179. TSH was 1.017, free T4 was 1.06, and free T3 was 3.3. His urinary microalbumin: creatinine ratio was 20.1 (normal less than 30).   C. The patient clearly needed better blood glucose control, which would only occur if he had better adherence to his plan. He appeared to have autonomic neuropathy, manifested by inappropriate sinus tachycardia.  His thyroid goiter suggested that he might have evolving Hashimoto's disease. He stated that he had lost some weight recently. I encouraged him to check his blood sugars more frequently and to take both correction boluses and food boluses.  2. 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 His hemoglobin A1c values have varied from 8.3-11.2%. In the last year, however, his A1c's have varied from 9.5-10.5%. Throughout the 11 year period, the patient's autonomic neuropathy and tachycardia have remained essentially the same. On 12/03/10 his total cholesterol was 270, triglycerides 94, HDL 50, and LDL 201. I started him on Crestor, 10 mg per day. He has since  begun treatment with atorvastatin and his cholesterol values have improved. In march 2018 he was converted to a Medtronic 670G pump and Guardian 3 CGM sensor. Since then his BGs have been much better.   3. The patient's last [facility] visit was on 04/10/17. At that visit I increased his ICRs. He  believes that these changes helped his BG control ;.  A.  In the interim he has been "pretty sick" for the past month, but variably. He stopped his eye drops, trazodone, Wellbutrin, and atorvastatin after seeing Dr. Darrick Penna on 04/14/17. He still has nasal congestion. His nausea has resolved. His spinning dizziness and vertigo have improved. He has more headaches. The headaches remain bitemporal and posterior at times. His severe muscle pains resolved. Muscle twitching is about the same or worse. His jitteriness and tremors of his right hand and right leg are worse. His eyes remain very sensitive to light. He had a follow up eye exam which showed that he is having some problems with eye muscle spasm.    B. He is not having many low BGs during the night.   C. He is in auto mode most of the time. He feels that the auto mode is working well.    D. He continues to have problems with lactose intolerance. When he avoids lactose, however, he is asymptomatic. Some foods such as shellfish cause his throat to swell up. He is tolerating losartan..     E. He is using Humalog aspart insulin in his insulin pump. He takes losartan, 50 mg/day.   F. He is still working from home. Due to his new illness is hours have been cut back. He will go on disability status soon.   G. The results of his sleep study showed "mild OSA". He did not like the staff at the sleep center in [facility], so he will schedule an appointment with another center in the near future.    H. He is scheduled to see a neurologist, Dr. Porfirio Mylar Dohmeier, on 05/28/17. He will also see Dr. Lucie Leather soon.   4. Pertinent Review of Systems: Constitutional: The patient feels "sick". He is still jittery. He has remained caffeine-free. He is still pretty tired and fatigued.   Eyes: As above. Last exam was on 05/04/17.  Neck: He has had more frequent anterior neck swelling and tenderness.  Heart: His heart rate has been normal. The patient has no complaints of  palpitations, irregular heat beats, chest pain, or chest pressure.  Gastrointestinal: As above. The patient has no other complaints of bloating after meals, excessive hunger, heartburn, acid reflux, upset stomach, stomach aches or pains, or constipation. Legs: As above. Calf muscles do not feel as tight. Muscle mass and strength seem normal. There are no other complaints of numbness, tingling, burning, or pain in the left foot. No edema is noted. Feet: As above. There are no obvious foot problems now. There are no other complaints of numbness, burning, or pain in the left foot.  No edema is noted. GU: No problems with libido or performance.   Hypoglycemia: Infrequent    Emotional: He still wants help with his Adult ADD. He has a follow up appointment with Dr. Piedad Climes in [place] next week..    5. BG printout: We have data from the past 4 weeks. He changes sites every 3-5 days. He checks BGs 6-12 times daily, average 9.3 times. BGs varied from 95-337, compared with 86-257 at last visit. Average BG is 169, compared with  159 at the last visit. Since he has been sleeping in late, he does not usually eat breakfast. The largest meal of the day is lunch. Dinners tend to be small most days.  6. CGM printout:  Skin glucose values and BG values correlated very well. Average SG was 151, compared with 146 at his last visit  He was in auto mode 77% of the time. His highest SGs occurred around 2 PM and his lowest SGs from 4-10 PM.   PAST MEDICAL, FAMILY, AND SOCIAL HISTORY:  Past Medical History:  Diagnosis Date  . ADHD (attention deficit hyperactivity disorder)   . Autonomic neuropathy due to diabetes (HCC)   . Diabetes (HCC)   . Fatigue   . Goiter   . High blood pressure   . High cholesterol   . Hypercholesterolemia   . Hypertension   . Hypoglycemia associated with diabetes (HCC)   . Obesity   . Tachycardia   . Type 1 diabetes mellitus not at goal [facility])   . Uncontrolled DM with  microalbuminuria or microproteinuria     Family History  Problem Relation Age of Onset  . Dementia Paternal Aunt   . Schizophrenia Paternal Aunt   . Cancer Maternal Grandmother   . Diabetes Maternal Grandmother        T2 DM  . Cancer Maternal Grandfather   . Thyroid disease Neg Hx      Current Outpatient Prescriptions:  .  Alcaftadine 0.25 % SOLN, Place 1 drop into both eyes daily as needed., Disp: 1 Bottle, Rfl: 3 .  atorvastatin (LIPITOR) 20 MG tablet, TAKE 1 AND 1/2 TABLETS BY MOUTH DAILY, Disp: 45 tablet, Rfl: 0 .  buPROPion (WELLBUTRIN XL) 150 MG 24 hr tablet, , Disp: , Rfl: 0 .  Fluticasone Propionate (XHANCE) 93 MCG/ACT EXHU, Place 2 sprays into both nostrils 2 (two) times daily as needed., Disp: 32 mL, Rfl: 3 .  HUMALOG 100 UNIT/ML injection, INJECT 300 UNITS VIA INSULIN PUMP EVERY 48 TO 72 HOURS PER HYPERGLYCEMIA AND DKA PROTOCOLS, Disp: 40 mL, Rfl: 0 .  losartan (COZAAR) 50 MG tablet, TAKE 1 TABLET(50 MG) BY MOUTH DAILY, Disp: 30 tablet, Rfl: 5 .  olopatadine (PATANOL) 0.1 % ophthalmic solution, Place 1 drop into both eyes 2 (two) times daily., Disp: 5 mL, Rfl: 5 .  Olopatadine HCl (PATADAY) 0.2 % SOLN, Place 1 drop into both eyes daily., Disp: 1 Bottle, Rfl: 3 .  traZODone (DESYREL) 50 MG tablet, TK 1-2 TS PO NIGHTLY, Disp: , Rfl: 0 .  AUVI-Q 0.3 MG/0.3ML SOAJ injection, Use as directed for life-threatening allergic reaction. (Patient not taking: Reported on 04/10/2017), Disp: 4 Device, Rfl: 2 .  ONE TOUCH ULTRA TEST test strip, TEST BLOOD SUGAR 6 TIMES DAILY. (Patient not taking: Reported on 04/10/2017), Disp: 200 each, Rfl: 3  Allergies as of 05/15/2017  . (No Known Allergies)    1. Work and Family: He is now working at home, but will soon start disability.  2. Activities: He has not been very physically active for the past several months.      3. Smoking, alcohol, or drugs: Occasional beer, about one-two per month. No tobacco or drugs. 4. Primary Care Provider: Dr.  Guerry Bruin, MD, [facility]  REVIEW OF SYSTEMS: There are no other significant problems involving Vicente's other body systems.   Objective:  Vital Signs:  BP 120/80   Pulse 82   Wt 241 lb 3.2 oz (109.4 kg)   BMI 32.71 kg/m  Ht Readings from Last 3 Encounters:  04/14/17 6' (1.829 m)  02/28/17 6' (1.829 m)  01/29/17 5' 10.87" (1.8 m)   Wt Readings from Last 3 Encounters:  05/15/17 241 lb 3.2 oz (109.4 kg)  04/14/17 241 lb (109.3 kg)  04/10/17 241 lb 6.4 oz (109.5 kg)   PHYSICAL EXAM:  Constitutional: The patient appears obese and feels ill. His weight is unchanged form his last visit. His affect is relatively flat. His insight is normal.  Face: The face appears normal.  Eyes: There is no obvious arcus or proptosis. Moisture appears normal. The conjunctivae are moderately inflamed, c/w allergic conjunctivitis. .  Mouth: The oropharynx and tongue appear normal. Oral moisture is normal. Neck: The neck appears to be visibly normal. No carotid bruits are noted. He has a short neck. His strap muscles are smaller. The thyroid gland is low lying and still enlarged at about 21-22 grams in size. The left lobe is larger today. The consistency of the lobes is normal. The lobes are both tender to palpation today, the left lobe more so.   Lungs: The lungs are clear to auscultation. Air movement is good.  Heart: Heart rate and rhythm are regular. Heart sounds S1 and S2 are normal. I did not appreciate any pathologic cardiac murmurs. Abdomen: The abdomen is enlarged. Bowel sounds are normal. There is no obvious hepatomegaly, splenomegaly, or other mass effect.  Arms: Muscle size and bulk are normal for age. Hands: There is 1+ tremor of his hands today. Phalangeal and metacarpophalangeal joints are normal. Palmar muscles are normal. Palmar skin is normal. Palmar moisture is also normal. Legs: Muscles appear normal for age. No edema is present. Neurologic: Strength is normal  for age in both the upper and lower extremities. Muscle tone is normal. Sensation to touch is normal in both legs and in both feet.     LAB DATA:   Labs 05/15/17: CBG 209  Labs 03/05/17: HbA1c 7.3%, CBG 241  Labs 02/20/17: CBG 180 fasting  Labs 01/29/17: CBG 148  Labs 01/12/17: CBG 116  Labs 12/29/16: HbA1c 9.1%, CBG 223; TSH 0.62, free T4 1.1, free T3 3.5; urine microalbumin/creatinine ratio 22; cholesterol 171, triglycerides 93, HDL 40, LDL 112;  CMP glucose 201  Labs 09/26/16: HbA1c 9.5%  Labs 08/13/16: Celiac panel negative; CMP normal except for glucose 207; CBC normal  Labs 06/13/16: HbA1c 9.8%; LH 3.6, FSH 2.3, testosterone 533 (ref [id number]), free testosterone 59.4 (ref 47-244)  Labs 02/11/16: HbA1c 9.2%  Labs 01/07/16: HbA1c 10.5%; CBC with Hgb elevated at 17.2%; LH 3.9, FSH 2.0; TSH 1.53, free T4 1.3, free T3 3.7; cholesterol 262, triglycerides 171, HDL 40, LDL 188; urinary microalbumin/creatinine ratio 31; CMP normal except for glucose 181  Labs 06/29/15. HbA1c 9.6%  Labs 12/28/14: Hemoglobin A1c 9.5% today, compared with 9.5%, at last visit and with 9.9% at the prior visit.  Labs 08/03/14: HbA1c 9.5%; TSH 0.548,free T4 1.10, free T3 3.7, TPO antibody < 1, TSI 30; CMP normal except glucose 191; urinary microalbumin/creatinine ratio 27; cholesterol 260, triglycerides 90, HDL 51, LDL 191   Labs 10/05/13: HbA1c 9.9%  Labs 04/23/13: CMP normal, except glucose 144; cholesterol 216, triglycerides 97, HDL 39, LDL 158; urinary microalbumin/creatinine ratio 6.9; TSH 0.505, free T4 1.17, free T3 3.8  Labs: 01/21/12: TSH 1.251, free T4 1.10, free T3 3.6, CBC normal, CMP normal except for glucose 206, urinary microalbumin/creatinine ratio 6.3, cholesterol 187, triglycerides 67, HDL 45, LDL 129   Assessment and Plan:   ASSESSMENT:  1. T1DM: His BGs are better and more stable on automode. Although he has been ill, the BGs have been surprisingly good.  The combination of the Medtronic 670G  pump and Guardian sensor are really helping him achieve better BG control.  2. Hypoglycemia: He has not had any documented low BGs or threshold suspends this month.   3. Hypertension: BP is good today. He needs to continue to take meds regularly and to exercise regularly.  4. Combined hyperlipidemia: His lipids in March 2017 were still too high, but in March 2018 were much better on atorvastatin. He discontinued the atorvastatin due to concern that this medication might be causing his neuromuscular symptoms. 5. Autonomic neuropathy with tachycardia and gastroparesis: His heart rate and postprandial bloating have normalized over time with better BG control. These problems fluctuate somewhat with his HbA1c levels over time. 6. Obesity: Weight has remained unchanged.       7-8. Goiter/thyroiditis:   A. His thyroid gland is about the same size today. The process of waxing and waning of thyroid gland size is c/w evolving Hashimoto's thyroiditis. His thyroiditis is clinically active bilaterally today, but more on the left today. .    B. He was euthyroid in March 2013, but was borderline hyperthyroid in June 2014. He was euthyroid in October 2015 and again in March 2017.  He was also euthyroid again in March 2018.  9. Fatigue: This problem has not improved.  10. Obstructive sleep apnea: He needs to move forward with this issue.   11. Adult ADD: He has an appointment with Dr. Piedad Climes, MD in [facility] again next week.  12. Proliferative diabetic retinopathy: This problem is due to his long period of poorly controled BGs. It will take at least 6-12 months of near-normal BGs for his retinopathy to stabilize.   13. URI/nasal congestion/vertigo/allergic rhinitis and conjunctivitis: He has been sick for several months, but seems to be slowly improving. He will see Dr. Lucie Leather again soon.   14. Neuromuscular symptoms: Some of these symptoms are new and have persisted despite discontinuing his trazodone,  Wellbutrin, and atorvastatin. The fact that he has had a variety of neuromuscular symptoms that have changed in location over time suggests the possibility that he might have early multiple sclerosis, which is another autoimmune disease. He will see a neurologist in two week.   PLAN:  1. Diagnostic:  Repeat CBG today. Call in two weeks on a Sunday or Wednesday evening. 2. Therapeutic: Follow DM care plan. Continue losartan. Continue his current pump basal rates, ISFs, and BG targets. New ICRs:   MN: 7 8 AM: 6 -> 5 9 PM: 7 3. Patient education: We discussed the significant improvements in his BGs since being on the auto mode. We also discussed the need for him to be evaluated again by Drs Lucie Leather and Shane Crutch and to be evaluated soon by Dr. Vickey Huger.  I praised him for doing such a great job of taking better care of his T1DM.  4. Follow-up:  1 month  Level of Service: This visit lasted in excess of 75 minutes. More than 50% of the visit was devoted to counseling.  Molli Knock, MD, CDE Adult and Pediatric Endocrinology  05/15/2017 9:48 AM

## 2017-05-15 NOTE — Patient Instructions (Signed)
Follow up visit in one month.  

## 2017-05-16 LAB — T3, FREE: T3 FREE: 3.8 pg/mL (ref 2.3–4.2)

## 2017-05-16 LAB — TSH: TSH: 0.9 mIU/L (ref 0.40–4.50)

## 2017-05-16 LAB — T4, FREE: FREE T4: 1.2 ng/dL (ref 0.8–1.8)

## 2017-05-26 ENCOUNTER — Other Ambulatory Visit: Payer: Self-pay

## 2017-05-26 ENCOUNTER — Ambulatory Visit
Admission: RE | Admit: 2017-05-26 | Discharge: 2017-05-26 | Disposition: A | Discharge status: Home / Self Care | Payer: 59 | Source: Ambulatory Visit | Attending: Sports Medicine | Admitting: Sports Medicine

## 2017-05-26 DIAGNOSIS — R251 Tremor, unspecified: Secondary | ICD-10-CM

## 2017-05-28 ENCOUNTER — Ambulatory Visit: Payer: 59 | Admitting: Neurology

## 2017-05-28 ENCOUNTER — Encounter: Payer: Self-pay | Admitting: Neurology

## 2017-05-28 ENCOUNTER — Ambulatory Visit (INDEPENDENT_AMBULATORY_CARE_PROVIDER_SITE_OTHER): Payer: 59 | Admitting: Neurology

## 2017-05-28 VITALS — BP 124/78 | HR 93 | Ht 72.0 in | Wt 248.0 lb

## 2017-05-28 DIAGNOSIS — E1043 Type 1 diabetes mellitus with diabetic autonomic (poly)neuropathy: Secondary | ICD-10-CM | POA: Diagnosis not present

## 2017-05-28 DIAGNOSIS — G4733 Obstructive sleep apnea (adult) (pediatric): Secondary | ICD-10-CM

## 2017-05-28 DIAGNOSIS — G629 Polyneuropathy, unspecified: Secondary | ICD-10-CM | POA: Diagnosis not present

## 2017-05-28 DIAGNOSIS — Z9641 Presence of insulin pump (external) (internal): Secondary | ICD-10-CM | POA: Diagnosis not present

## 2017-05-28 DIAGNOSIS — R251 Tremor, unspecified: Secondary | ICD-10-CM | POA: Diagnosis not present

## 2017-05-28 DIAGNOSIS — E133599 Other specified diabetes mellitus with proliferative diabetic retinopathy without macular edema, unspecified eye: Secondary | ICD-10-CM

## 2017-05-28 DIAGNOSIS — F515 Nightmare disorder: Secondary | ICD-10-CM | POA: Diagnosis not present

## 2017-05-28 DIAGNOSIS — R5383 Other fatigue: Secondary | ICD-10-CM

## 2017-05-28 DIAGNOSIS — R29818 Other symptoms and signs involving the nervous system: Secondary | ICD-10-CM | POA: Diagnosis not present

## 2017-05-28 NOTE — Progress Notes (Signed)
SLEEP MEDICINE CLINIC   Provider:  Melvyn Novasarmen  Nicholai [name], M D  Primary Care Physician:  Joseph Garbeisovec, Richard W, MD   [facility] ,    Chief Complaint  Patient presents with  . New Patient (Initial Visit)    HST yr ago diagnosed with OSA not treaed, tremors in right hand/foot and shakey vision    HPI:  [name] is a 36 y.o. male , seen here as in a referral/ revisit  from sports medicine and Dr [name], ped endocrinologist.  I would like to thank Dr. Fransico [name] for his report notes which helped me today to design further sleep evaluation and treatment for this patient. He also gave me an excellent summary of the past medical problems that [name] has faced. [name] was diagnosed with diabetes at age 36 and has been followed by a pediatric endocrinologist, Dr. Fransico [name]. His blood sugars have been better and more stable on an auto mode Medtronic insulin pump. He also faces problems with obesity, hypertension, hyperlipidemia and an autonomic neuropathy with tachycardia and gastroparesis. As blood pressure control had improved so did his heart rate and his postprandial bloating. He has been treated for thyroiditis consistent with an evolving Hashimoto's ultra immune thyroid disease. He was euthyroid again in March 2018 when last checked he had reported fatigue and was diagnosed by a home sleep test last year at " [facility] was  supposedly diagnosed with obstructive sleep apnea but had not treatment initiated .  He has proliferative retinopathy, attributed to diabetes. Dr. Fransico [name] also wanted to make sure that current rather newish neuromuscular symptoms beI evaluated -he was especially concerned that this patient is prone to further auto-immune diseases such as central demyelination or multiple sclerosis. Dr. Darrick [name] in this sport medicine section of [place] had added in this visit on 04/15/2007 but the patient habits and improvement of nausea, vertigo,  after discontinuing eyedrops, trazodone and Wellbutrin and atorvastatin. He had more headaches. He felt jittery and tremors of his right hand and right leg were temporary worse. He describes photophobia. He felt shaking more in the morning l right hand and right leg, an inner jitteriness. It has improved. Vertigo not present today.    Sleep habits are as follows:  " Bedtime between 10 and 12 midnight, rises at 7 AM. He describes his sleep is rather restless, trying to find a comfortable sleep position, but she doesn't necessarily use the bed during the night. He does not report nocturia, and only twice or so in the last month has he woke up with palpitations or clamminess, likely related to hypoglycemia. He is known to snore, and he had a [facility] and was told that he has obstructive sleep apnea. Treatment was not initiated yet. He wakes up not feeling restored or refreshed, and he has noticed that his jittery illness affecting the right extremities and a feeling of inner trembling of his torso is the worst in the morning hours. It doesn't last that long. He also has a pulsating change of vision when he first gets up and he suffered from vertigo for much of the last 3 months. No daytime naps, he wouldn't sleep at night if he did.    Sleep medical history and family sleep history:  Dad snoring, mom is an insomniac. He remembers having problems sleeping even in childhood. He has felt fatigued and not restored for several months.  Social history: works as Games developer. The patient is single, no children. No tobacco use, seldom alcohol use,  4 months ago he stopped drinking caffeine due to the jitteriness, and headaches with photophobia and a feeling of the head being full could also be relating to sinusitis.  Review of Systems: Out of a complete 14 system review, the patient complains of only the following symptoms, and all other  reviewed systems are negative.   Epworth score 7 , Fatigue severity score 35  , depression score n/a    Social History   Social History  . Marital status: Single    Spouse name: N/A  . Number of children: N/A  . Years of education: N/A   Occupational History  . Not on file.   Social History Main Topics  . Smoking status: Never Smoker  . Smokeless tobacco: Never Used  . Alcohol use Not on file  . Drug use: Unknown  . Sexual activity: Not on file   Other Topics Concern  . Not on file   Social History Narrative   ** Merged History Encounter **        Family History  Problem Relation Age of Onset  . Dementia Paternal Aunt   . Schizophrenia Paternal Aunt   . Cancer Maternal Grandmother   . Diabetes Maternal Grandmother        T2 DM  . Cancer Maternal Grandfather   . Thyroid disease Neg Hx     Past Medical History:  Diagnosis Date  . ADHD (attention deficit hyperactivity disorder)   . Autonomic neuropathy due to diabetes (HCC)   . Diabetes (HCC)   . Fatigue   . Goiter   . High blood pressure   . High cholesterol   . Hypercholesterolemia   . Hypertension   . Hypoglycemia associated with diabetes (HCC)   . Obesity   . Tachycardia   . Type 1 diabetes mellitus not at goal [facility])   . Uncontrolled DM with microalbuminuria or microproteinuria     Past Surgical History:  Procedure Laterality Date  . none    . WISDOM TOOTH EXTRACTION      Current Outpatient Prescriptions  Medication Sig Dispense Refill  . atorvastatin (LIPITOR) 20 MG tablet TAKE 1 AND 1/2 TABLETS BY MOUTH DAILY 45 tablet 0  . AUVI-Q 0.3 MG/0.3ML SOAJ injection Use as directed for life-threatening allergic reaction. 4 Device 2  . clonazePAM (KLONOPIN) 0.5 MG tablet Take 0.5 mg by mouth 2 (two) times daily as needed.  0  . HUMALOG 100 UNIT/ML injection INJECT 300 UNITS VIA INSULIN PUMP EVERY 48 TO 72 HOURS PER HYPERGLYCEMIA AND DKA PROTOCOLS 40 mL 0  . losartan (COZAAR) 50 MG tablet TAKE 1  TABLET(50 MG) BY MOUTH DAILY 30 tablet 5  . ONE TOUCH ULTRA TEST test strip TEST BLOOD SUGAR 6 TIMES DAILY. 200 each 3  . ONE TOUCH ULTRA TEST test strip Check sugar 15 x daily 450 each 3   No current facility-administered medications for this visit.     Allergies as of 05/28/2017  . (No Known Allergies)    Vitals: BP 124/78   Pulse 93   Ht 6' (1.829 m)   Wt 248 lb (112.5 kg)   BMI 33.63 kg/m  Last Weight:  Wt Readings from Last 1 Encounters:  05/28/17 248 lb (112.5 kg)   ZOX:WRUE mass index is 33.63 kg/m.     Last Height:   Ht Readings from Last 1 Encounters:  05/28/17 6' (1.829  m)    Physical exam:  General: The patient is awake, alert and appears not in acute distress. The patient is well groomed. Head: Normocephalic, atraumatic. Neck is supple. Mallampati 5-  ,  neck circumference:21. Nasal airflow patent , TMJ is  Not  evident . Retrognathia is not seen. Full facial hair.  Cardiovascular:  Regular rate and rhythm , without  murmurs or carotid bruit, and without distended neck veins. Respiratory: Lungs are clear to auscultation. Skin:  Without evidence of edema, or rash Trunk: BMI is elevated  The patient's posture is erect   Neurologic exam : The patient is awake and alert, oriented to place and time.   Memory subjective described as intact.  Attention span & concentration ability appears normal.  Speech is fluent,  without   dysarthria, dysphonia or aphasia.  Mood and affect are appropriate.  Cranial nerves: Pupils are equal and briskly reactive to light. Funduscopic exam with evidence of pallor / edema left more than right eye, no cataract,laser scars.  Extraocular movements  in vertical and horizontal planes intact and without nystagmus. Visual fields by finger perimetry are intact. Hearing to finger rub intact.  Facial sensation intact to fine touch. Facial motor strength is symmetric and tongue and uvula move midline. Shoulder shrug was symmetrical.   Motor  exam:  Normal tone, muscle bulk and symmetric strength in all extremities.  Sensory:  Fine touch, pinprick and vibration were tested in all extremities. Proprioception tested in the upper extremities was normal.  Coordination: Rapid alternating movements in the fingers/hands was normal. Finger-to-nose maneuver  normal without evidence of ataxia, dysmetria or tremor. NO TREMOR HERE>   Gait and station: Patient walks without assistive device and is able unassisted to climb up to the exam table. Strength within normal limits.  Stance is stable and normal.    Deep tendon reflexes: in the  upper and lower extremities are symmetric and intact. Babinski maneuver response is  downgoing.    Assessment:  After physical and neurologic examination, review of laboratory studies,  Personal review of imaging studies, reports of other /same  Imaging studies, results of polysomnography and / or neurophysiology testing and pre-existing records as far as provided in visit., my assessment is   1) as to the patient's feeling of jitteriness and right sided tremors, these have much improved over the last month or so. He describes some worsen the morning and I do wonder if there is a component of hypoglycemia or an allergic component. He reports a fullness, headaches the feeling that his ear is filled with fluid and that his sinuses are swollen. He has taken antihistamines, but they made the tremor is worse. He felt more restless and jittery. At this time he seems to be not anxious and actually presented without tremor, without fasciculation and with equal muscle tone in left and right upper and lower extremities. He has attenuated reflexes, he has good balance, stable gait. As he had felt the tremor is only affecting the right side I would consider an MRI with and without contrast for this patient to make sure that this is not a small vessel manifestation. As a young onset diabetic with multiple sequelae of diabetic  neuropathy, autonomic neuropathy, retinopathy he could be prone to cerebrovascular changes as well.  2) his sleep evaluation has been in complete. I would like for him to undergo an attended sleep study with sleep behavior montage. PLM screening, and due to his headaches which seem to also be worse  in the morning we will need to incorporate capnography.    The patient was advised of the nature of the diagnosed disorder , the treatment options and the  risks for general health and wellness arising from not treating the condition.   I spent more than 60 minutes of face to face time with the patient.  Greater than 50% of time was spent in counseling and coordination of care. We have discussed the diagnosis and differential and I answered the patient's questions.    Plan:  Treatment plan and additional workup :   MRI brain - with and without gad. rule out microvascular disease or demyelination.  PSG with REM BH montage. Capnography. May need to SPLIT -     Joseph Novas, MD 05/28/2017, 10:21 AM  Certified in Neurology by ABPN Certified in Sleep Medicine by [facility] [address], [address] [place], [place] [address]

## 2017-05-31 ENCOUNTER — Other Ambulatory Visit (INDEPENDENT_AMBULATORY_CARE_PROVIDER_SITE_OTHER): Payer: Self-pay | Admitting: "Endocrinology

## 2017-05-31 DIAGNOSIS — IMO0001 Reserved for inherently not codable concepts without codable children: Secondary | ICD-10-CM

## 2017-05-31 DIAGNOSIS — E1065 Type 1 diabetes mellitus with hyperglycemia: Principal | ICD-10-CM

## 2017-06-03 ENCOUNTER — Ambulatory Visit (INDEPENDENT_AMBULATORY_CARE_PROVIDER_SITE_OTHER): Payer: 59

## 2017-06-03 DIAGNOSIS — R29818 Other symptoms and signs involving the nervous system: Secondary | ICD-10-CM | POA: Diagnosis not present

## 2017-06-03 MED ORDER — GADOPENTETATE DIMEGLUMINE 469.01 MG/ML IV SOLN: 20.0000 mL | Freq: Once | INTRAVENOUS | Status: AC | PRN

## 2017-06-04 ENCOUNTER — Ambulatory Visit (INDEPENDENT_AMBULATORY_CARE_PROVIDER_SITE_OTHER): Payer: 59 | Admitting: Sports Medicine

## 2017-06-04 ENCOUNTER — Encounter: Payer: Self-pay | Admitting: Sports Medicine

## 2017-06-04 DIAGNOSIS — R29818 Other symptoms and signs involving the nervous system: Secondary | ICD-10-CM | POA: Diagnosis not present

## 2017-06-04 DIAGNOSIS — I898 Other specified noninfective disorders of lymphatic vessels and lymph nodes: Secondary | ICD-10-CM | POA: Diagnosis not present

## 2017-06-04 NOTE — Progress Notes (Signed)
   HPI  CC: Tremor and muscle twitching Patient is here for follow-up on his tremor and muscle twitching. He states that his symptoms have significantly improved since last visit. He has not identified any specific change that was made that improved his symptoms. He states he still has an occasional muscle twitch in his lower extremities but this is significantly less debilitating than it had been previously. He states that he has discontinued many of his medications with the exception of his blood pressure medication and insulin. He endorses improved insulin control. He denies any weakness, numbness, or paresthesias.  Patient was seen by neurology last week and a brain MRI was ordered. MRI was obtained yesterday but radiology reads have not been released. X-ray of his cervical spine at the last visit yielded no significant bony abnormality. Of note there was a calcified lymph node/salivary gland seen.  See HPI and/or previous note for associated ROS.  Objective: BP 120/80   Ht 6' (1.829 m)   Wt 230 lb (104.3 kg)   BMI 31.19 kg/m  Gen: NAD, well groomed, a/o x3, normal affect.  CV: Well-perfused. Warm.  Resp: Non-labored.  Neuro: Sensation intact throughout. No gross coordination deficits.  Gait: Nonpathologic posture, unremarkable stride without signs of limp or balance issues. MSK: No obvious bony abnormalities. No tenderness to palpation over his joints. No erythema, ecchymosis, or swelling appreciated. Range of motion intact throughout. Strength 5/5 throughout. Sensation intact throughout. DTRs +2 bilaterally. No evidence of tremor on exam.  Assessment and plan:  Other symptoms and signs involving the nervous system Patient is here for follow-up on his tremor/muscle twitching. Symptoms have nearly resolved since our last visit. No identified source at this time. There is strong suspicion that patient may have been experiencing a drug-drug interaction with patient's Wellbutrin and  trazodone, as this was the only change that had been done at the previous visit. - No interventions deemed necessary at this time, as patient is mostly asymptomatic. - Interested in MRI results - Follow-up as needed - We'll forward to neurology and endocrinology.  Calcified lymph nodes X-ray of patient's cervical spine yielded an incidental finding of a calcified lymph node. Unlikely to be the cause of any of patient's previous symptoms. Consider workup if patient is persistently symptomatic with pain over this region.   Kathee DeltonIan D Wilberta Dorvil, MD,MS [facility] 06/04/2017 12:48 PM

## 2017-06-04 NOTE — Assessment & Plan Note (Signed)
X-ray of patient's cervical spine yielded an incidental finding of a calcified lymph node. Unlikely to be the cause of any of patient's previous symptoms. Consider workup if patient is persistently symptomatic with pain over this region.

## 2017-06-04 NOTE — Assessment & Plan Note (Signed)
Patient is here for follow-up on his tremor/muscle twitching. Symptoms have nearly resolved since our last visit. No identified source at this time. There is strong suspicion that patient may have been experiencing a drug-drug interaction with patient's Wellbutrin and trazodone, as this was the only change that had been done at the previous visit. - No interventions deemed necessary at this time, as patient is mostly asymptomatic. - Interested in MRI results - Follow-up as needed - We'll forward to neurology and endocrinology.

## 2017-06-06 NOTE — Progress Notes (Signed)
I agree with the assessment and plan as directed by Dr Darrick PennaFields, and I am happy to see his diabetis control has improved.    Yarisa Lynam, MD

## 2017-06-08 ENCOUNTER — Telehealth: Payer: Self-pay | Admitting: Neurology

## 2017-06-08 NOTE — Telephone Encounter (Signed)
Called to speak with patient. Wanted to inform him that his MRI was normal. No answer at this time. Pt voicemail box was full was unable to leave a VM.

## 2017-06-08 NOTE — Telephone Encounter (Signed)
-----   Message from Melvyn Novasarmen Dohmeier, MD sent at 06/07/2017 10:26 AM EDT ----- Normal MRI brain, cc to PCP. CD

## 2017-06-08 NOTE — Progress Notes (Signed)
Called 8/13 to discuss, no answer, was unable to leave VM

## 2017-06-08 NOTE — Telephone Encounter (Signed)
Pt returned RN's call. Message relayed his mailbox was full and the MRI was normal. Pt did not have any questions.

## 2017-06-10 ENCOUNTER — Ambulatory Visit (INDEPENDENT_AMBULATORY_CARE_PROVIDER_SITE_OTHER): Payer: 59 | Admitting: Allergy and Immunology

## 2017-06-10 ENCOUNTER — Encounter: Payer: Self-pay | Admitting: Allergy and Immunology

## 2017-06-10 VITALS — BP 122/82 | HR 103 | Temp 97.8°F | Resp 18

## 2017-06-10 DIAGNOSIS — J3089 Other allergic rhinitis: Secondary | ICD-10-CM | POA: Diagnosis not present

## 2017-06-10 DIAGNOSIS — H6983 Other specified disorders of Eustachian tube, bilateral: Secondary | ICD-10-CM | POA: Diagnosis not present

## 2017-06-10 DIAGNOSIS — Z91018 Allergy to other foods: Secondary | ICD-10-CM

## 2017-06-10 MED ORDER — MONTELUKAST SODIUM 10 MG PO TABS: 10.0000 mg | ORAL_TABLET | Freq: Every day | ORAL | 5 refills | Status: DC

## 2017-06-10 NOTE — Patient Instructions (Signed)
  1. Allergen avoidance measures  2. Treat and prevent inflammation:   A. OTC Nasacort 1 spray each nostril once a day  B. montelukast 10 mg tablet once a day  3. If needed:   A. nasal saline  B. OTC antihistamine  C. Auvi-Q 0.3  4. Obtain fall flu vaccine  5. Return to clinic in 4 weeks or earlier if problem

## 2017-06-10 NOTE — Progress Notes (Signed)
Follow-up Note  Referring Provider: Gaspar Garbe, MD Primary Provider: Gaspar Garbe, MD Date of Office Visit: 06/10/2017  Subjective:   [name] (DOB: 07-Jan-1981) is a 36 y.o. male who [facility] in re-evaluation of the following:  HPI: [name] presents to this clinic in evaluation of recurrent respiratory tract symptoms that have developed over the course the past several months. He has had nasal congestion and sneezing and ear pressure and has been diagnosed with ETD and has been treated with various agents including Flonase which gave rise to an acute episode of shortness of breath and possible throat closing. He has seen Dr. Ezzard Standing, ENT doctor, for this issue. He has also been having some itchy eyes on occasion.  I last saw [name] in this clinic November 2017 for what appeared to be ACE inhibitor induced swelling reactions which fortunately completely terminated with the discontinuation of his ACE inhibitor. He did visit with Dr. Delorse Lek in June 2018 who gave him Flonase and Xyzal and an eyedrop. He does have a history of reactivity to muscles and has a epinephrine delivery device for that issue.He remains away from consuming shellfish.  Allergies as of 06/10/2017      Reactions   Flonase [fluticasone] Shortness Of Breath      Medication List      atorvastatin 20 MG tablet Commonly known as:  LIPITOR TAKE 1 AND 1/2 TABLETS BY MOUTH DAILY   AUVI-Q 0.3 mg/0.3 mL Soaj injection Generic drug:  EPINEPHrine Use as directed for life-threatening allergic reaction.   clonazePAM 0.5 MG tablet Commonly known as:  KLONOPIN Take 0.5 mg by mouth 2 (two) times daily as needed.   HUMALOG 100 UNIT/ML injection Generic drug:  insulin lispro INJECT 300 UNITS VIA INSULIN PUMP EVERY 48 TO 72 HOURS PER HYPERGLCYMIA AND DKA PROTOCOLS   losartan 50 MG tablet Commonly known as:  COZAAR TAKE 1 TABLET(50 MG) BY MOUTH DAILY   ONE  TOUCH ULTRA TEST test strip Generic drug:  glucose blood TEST BLOOD SUGAR 6 TIMES DAILY.   ONE TOUCH ULTRA TEST test strip Generic drug:  glucose blood Check sugar 15 x daily       Past Medical History:  Diagnosis Date  . ADHD (attention deficit hyperactivity disorder)   . Autonomic neuropathy due to diabetes (HCC)   . Diabetes (HCC)   . Fatigue   . Goiter   . High blood pressure   . High cholesterol   . Hypercholesterolemia   . Hypertension   . Hypoglycemia associated with diabetes (HCC)   . Obesity   . Tachycardia   . Type 1 diabetes mellitus not at goal [facility])   . Uncontrolled DM with microalbuminuria or microproteinuria     Past Surgical History:  Procedure Laterality Date  . none    . WISDOM TOOTH EXTRACTION      Review of systems negative except as noted in HPI / PMHx or noted below:  Review of Systems  Constitutional: Negative.   HENT: Negative.   Eyes: Negative.   Respiratory: Negative.   Cardiovascular: Negative.   Gastrointestinal: Negative.   Genitourinary: Negative.   Musculoskeletal: Negative.   Skin: Negative.   Neurological: Negative.   Endo/Heme/Allergies: Negative.   Psychiatric/Behavioral: Negative.      Objective:   Vitals:   06/10/17 0917  BP: 122/82  Pulse: (!) 103  Resp: 18  Temp: 97.8 F (36.6 C)  SpO2: 98%  Physical Exam  Constitutional: He is well-developed, well-nourished, and in no distress.  HENT:  Head: Normocephalic.  Right Ear: Tympanic membrane, external ear and ear canal normal.  Left Ear: Tympanic membrane, external ear and ear canal normal.  Nose: Nose normal. No mucosal edema or rhinorrhea.  Mouth/Throat: Uvula is midline, oropharynx is clear and moist and mucous membranes are normal. No oropharyngeal exudate.  Eyes: Conjunctivae are normal.  Neck: Trachea normal. No tracheal tenderness present. No tracheal deviation present. No thyromegaly present.  Cardiovascular: Normal rate, regular rhythm, S1  normal, S2 normal and normal heart sounds.   No murmur heard. Pulmonary/Chest: Breath sounds normal. No stridor. No respiratory distress. He has no wheezes. He has no rales.  Musculoskeletal: He exhibits no edema.  Lymphadenopathy:       Head (right side): No tonsillar adenopathy present.       Head (left side): No tonsillar adenopathy present.    He has no cervical adenopathy.  Neurological: He is alert. Gait normal.  Skin: No rash noted. He is not diaphoretic. No erythema. Nails show no clubbing.  Psychiatric: Mood and affect normal.    Diagnostics: Allergy skin testing was performed. He demonstrated hypersensitivity against tree pollen and molds.   Assessment and Plan:   1. Other allergic rhinitis   2. ETD (Eustachian tube dysfunction), bilateral   3. Food allergy     1. Allergen avoidance measures  2. Treat and prevent inflammation:   A. OTC Nasacort 1 spray each nostril once a day  B. montelukast 10 mg tablet once a day  3. If needed:   A. nasal saline  B. OTC antihistamine  C. Auvi-Q 0.3  4. Obtain fall flu vaccine  5. Return to clinic in 4 weeks or earlier if problem  I will have [name] use a combination of anti-inflammatory medications for his respiratory tract and we will see what type of result we get over the course of the next 4 weeks. He would be a candidate for immunotherapy if he fails medical treatment.  Laurette Schimke, MD Allergy / Immunology [facility]

## 2017-06-12 NOTE — Addendum Note (Signed)
Addended by: Dub Mikes on: 06/12/2017 03:19 PM   Modules accepted: Orders

## 2017-06-16 ENCOUNTER — Ambulatory Visit (INDEPENDENT_AMBULATORY_CARE_PROVIDER_SITE_OTHER): Payer: 59 | Admitting: "Endocrinology

## 2017-06-16 ENCOUNTER — Encounter (INDEPENDENT_AMBULATORY_CARE_PROVIDER_SITE_OTHER): Payer: Self-pay | Admitting: "Endocrinology

## 2017-06-16 VITALS — BP 116/70 | HR 90 | Wt 247.6 lb

## 2017-06-16 DIAGNOSIS — F902 Attention-deficit hyperactivity disorder, combined type: Secondary | ICD-10-CM | POA: Diagnosis not present

## 2017-06-16 DIAGNOSIS — R5383 Other fatigue: Secondary | ICD-10-CM | POA: Diagnosis not present

## 2017-06-16 DIAGNOSIS — E1065 Type 1 diabetes mellitus with hyperglycemia: Secondary | ICD-10-CM

## 2017-06-16 DIAGNOSIS — I898 Other specified noninfective disorders of lymphatic vessels and lymph nodes: Secondary | ICD-10-CM

## 2017-06-16 DIAGNOSIS — R000 Tachycardia, unspecified: Secondary | ICD-10-CM | POA: Diagnosis not present

## 2017-06-16 DIAGNOSIS — I10 Essential (primary) hypertension: Secondary | ICD-10-CM | POA: Diagnosis not present

## 2017-06-16 DIAGNOSIS — E049 Nontoxic goiter, unspecified: Secondary | ICD-10-CM

## 2017-06-16 DIAGNOSIS — IMO0001 Reserved for inherently not codable concepts without codable children: Secondary | ICD-10-CM

## 2017-06-16 DIAGNOSIS — G4733 Obstructive sleep apnea (adult) (pediatric): Secondary | ICD-10-CM

## 2017-06-16 DIAGNOSIS — E069 Thyroiditis, unspecified: Secondary | ICD-10-CM

## 2017-06-16 DIAGNOSIS — J309 Allergic rhinitis, unspecified: Secondary | ICD-10-CM

## 2017-06-16 DIAGNOSIS — E1043 Type 1 diabetes mellitus with diabetic autonomic (poly)neuropathy: Secondary | ICD-10-CM

## 2017-06-16 DIAGNOSIS — E10649 Type 1 diabetes mellitus with hypoglycemia without coma: Secondary | ICD-10-CM | POA: Diagnosis not present

## 2017-06-16 LAB — POCT GLUCOSE (DEVICE FOR HOME USE): POC GLUCOSE: 193 mg/dL — AB (ref 70–99)

## 2017-06-16 LAB — POCT GLYCOSYLATED HEMOGLOBIN (HGB A1C): HEMOGLOBIN A1C: 6.5

## 2017-06-16 NOTE — Patient Instructions (Signed)
Follow up visit in one month.  

## 2017-06-16 NOTE — Progress Notes (Signed)
Subjective:  Patient Name: [name] Date of Birth: 01/19/1981  MRN: [id number]  [name]  presents to the office today for follow-up of his type 1 diabetes mellitus, goiter, obesity, combined hyperlipidemia, adult ADHD, fatigue, autonomic neuropathy, tachycardia, hypertension, microalbuminuria, and hypoglycemia, plus a new illness syndrome.  HISTORY OF PRESENT ILLNESS:   [name] is a 36 y.o. Caucasian gentleman. [name] was unaccompanied.  1. The patient was first referred to me on 01/08/2006 by his family physician, Dr. Roanna EpleyBert Fields, for evaluation and management of type 1 diabetes mellitus and related problems. The patient was 36 years old.  A. [name] had been diagnosed with type 1 diabetes somewhere in 2001-2002, at the age of [age]. He was initially diagnosed with type 2 diabetes mellitus, but was later re-classified as type 1 diabetes mellitus. He had been followed by Dr. Arther Damesom O'Connell, staff endocrinologist at [facility]. [name] was started on an insulin pump approximately 18 months prior to first seeing me. His pump was a Medtronic Paradigm 712. His blood glucose control was fair-to-poor. He was frequently not checking blood sugars or taking insulin boluses as often as he needed to. He frequently noted fast heart rate. The patient's past medical history was positive for hypertension, for which he was taking lisinopril, and for what his mother called "extreme ADHD". He had previously been taking ADHD medicines but had discontinued them due to adverse effects. He had prior ankle injuries and knee injuries, to include tears of the lateral menisci bilaterally. He had not had any surgeries. He was working for a [employer] company that had many different job sites in different parts of the [place] and in other countries as well. 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 He did not use tobacco or drugs, but did drink alcohol occasionally.  B. On physical  examination, his weight was 234 pounds, his height was 70 inches, his BMI was 33.2. Blood pressure was 128/70. Hemoglobin A1c was 9.5%. Heart rate was 96. He had normal affect and fair insight. He had a 25-30 gram goiter. He had normal 1+ DP pulses in his feet. He had normal sensation in his feet to touch, vibration, and monofilament. His CMP was normal except for glucose of 251. His cholesterol was 257, triglycerides 143, HDL 49, and LDL 179. TSH was 1.017, free T4 was 1.06, and free T3 was 3.3. His urinary microalbumin: creatinine ratio was 20.1 (normal less than 30).   C. The patient clearly needed better blood glucose control, which would only occur if he had better adherence to his plan. He appeared to have autonomic neuropathy, manifested by inappropriate sinus tachycardia.  His thyroid goiter suggested that he might have evolving Hashimoto's disease. He stated that he had lost some weight recently. I encouraged him to check his blood sugars more frequently and to take both correction boluses and food boluses.  2. 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 His hemoglobin A1c values have varied from 8.3-11.2%. In the last year, however, his A1c's have varied from 9.5-10.5%. Throughout the 11 year period, the patient's autonomic neuropathy and tachycardia have remained essentially the same. On 12/03/10 his total cholesterol was 270, triglycerides 94, HDL 50, and LDL 201. I started him on Crestor, 10 mg per day. He has since  begun treatment with atorvastatin and his cholesterol values have improved. In March 2018 he was converted to a Medtronic 670G pump and Guardian 3 CGM sensor. Since then his BGs have been much better.   3. The patient's last [facility] visit was on 05/15/17. At that visit I increased his ICRs. He  believes that these changes helped his BG control ;.  A.  In the interim he has still been "pretty sick" for the past month, but variably. He stopped his eye drops, trazodone, Wellbutrin, and atorvastatin after seeing Dr. Darrick Penna on 04/14/17. He still has nasal congestion. His nausea has recurred, but only a little bit. His spinning dizziness and vertigo have improved and are not as bad. He has many headaches. The headaches remain bitemporal and posterior at times. His severe muscle pains resolved. Muscle twitching is about the same or better. His jitteriness and tremors have "gotten a lot better", both less frequent and less severe. His eyes remain very sensitive to light. He had a follow up eye exam in July 2018 which showed that he is having some problems with eye muscle spasm.    B. He is having more low BGs about 4:30 PM.    C. He is in auto mode most of the time. He feels that the auto mode is working well.    D. He continues to have problems with lactose intolerance. When he avoids lactose, however, he is asymptomatic. Some foods such as shellfish cause his throat to swell up. He is tolerating losartan..     E. He is using Humalog aspart insulin in his insulin pump. He takes losartan, 50 mg/day.   F. He saw Dr. Lucie Leather again on 06/10/17.  Allergy tests showed that he has many allergies, to include molds, trees, and pollens. He is now allergic year-round. OTC antihistamines such as Claritin and Xyzal make him feel "terrible". He had a CT of his sinuses on 04/24/17. This study showed mild-moderate edema of the sinuses and occlusion of the left ostiomeatal unit due to edema. He also had a cervical spine series on 05/26/17 that showed an apparent calcified lymph node to the right of C3 measuring 1 x 1 cm.  G. He saw Dr. Vickey Huger in neurology on 05/28/17. His MRI was essentially normal.   F. He is still working from home. Due to his new illness is hours have been cut back. He may go on disability status soon.    G. Her is due to have a new sleep study soon.      4. Pertinent Review of Systems: Constitutional: The patient feels "sick". He has remained caffeine-free. He is still pretty tired and fatigued.   Eyes: As above. Last exam was on 05/04/17. He is receiving intraocular injections, but is not sure why.  Neck: He has had more frequent anterior neck swelling and tenderness.  Heart: His heart rate has been normal. The patient has no complaints of palpitations, irregular heat beats, chest pain, or chest pressure.  Gastrointestinal: He has more nausea. The patient has no other complaints of bloating after meals, excessive hunger, heartburn, acid reflux, upset stomach, stomach aches or pains, or constipation. Legs: As above. Muscle mass and strength seem normal. There are no other complaints of numbness, tingling, burning, or pain in the left foot. No edema is noted. Feet: As above. There are no obvious foot problems now. There are no other complaints of numbness, burning, or pain in the left foot.  No edema is noted. Hypoglycemia: Infrequent  Emotional: He still wants help with his Adult ADD. He has a follow up appointment with Dr. Piedad Climes in [place] next week..    5. BG printout: We have data from the past 4 weeks. He changes sites every 2-4 days. He checks BGs 4-10 times daily. BGs varied from 58-250, compared with 95-337 at his last visit and with 86-257 at prior visit. Average BG is  163, compared with 169 at his last visit and with 159 at the prior visit. He usually has only a light breakfast, then sometimes eats more at lunch or sometimes at dinner. His highest BG occurred after he had an injection in his left eye. His next highest BG occurred after he ate Sunday dinner at his parents' house.   6. CGM printout: Skin glucose values and BG values correlated very well. Average SG was 149, compared with 151 at his last visit and with 146 at his prior visit  He was in auto mode 97% of the time.  His highest SGs occurred around 2 PM and his lowest SGs from 4-10 PM.   PAST MEDICAL, FAMILY, AND SOCIAL HISTORY:  Past Medical History:  Diagnosis Date  . ADHD (attention deficit hyperactivity disorder)   . Autonomic neuropathy due to diabetes (HCC)   . Diabetes (HCC)   . Fatigue   . Goiter   . High blood pressure   . High cholesterol   . Hypercholesterolemia   . Hypertension   . Hypoglycemia associated with diabetes (HCC)   . Obesity   . Tachycardia   . Type 1 diabetes mellitus not at goal [facility])   . Uncontrolled DM with microalbuminuria or microproteinuria     Family History  Problem Relation Age of Onset  . Dementia Paternal Aunt   . Schizophrenia Paternal Aunt   . Cancer Maternal Grandmother   . Diabetes Maternal Grandmother        T2 DM  . Cancer Maternal Grandfather   . Thyroid disease Neg Hx      Current Outpatient Prescriptions:  .  HUMALOG 100 UNIT/ML injection, INJECT 300 UNITS VIA INSULIN PUMP EVERY 48 TO 72 HOURS PER HYPERGLCYMIA AND DKA PROTOCOLS, Disp: 40 mL, Rfl: 0 .  losartan (COZAAR) 50 MG tablet, TAKE 1 TABLET(50 MG) BY MOUTH DAILY, Disp: 30 tablet, Rfl: 5 .  ONE TOUCH ULTRA TEST test strip, Check sugar 15 x daily, Disp: 450 each, Rfl: 3 .  atorvastatin (LIPITOR) 20 MG tablet, TAKE 1 AND 1/2 TABLETS BY MOUTH DAILY (Patient not taking: Reported on 06/16/2017), Disp: 45 tablet, Rfl: 0 .  AUVI-Q 0.3 MG/0.3ML SOAJ injection, Use as directed for life-threatening allergic reaction. (Patient not taking: Reported on 06/16/2017), Disp: 4 Device, Rfl: 2 .  clonazePAM (KLONOPIN) 0.5 MG tablet, Take 0.5 mg by mouth 2 (two) times daily as needed., Disp: , Rfl: 0 .  montelukast (SINGULAIR) 10 MG tablet, Take 1 tablet (10 mg total) by mouth at bedtime. (Patient not taking: Reported on 06/16/2017), Disp: 30 tablet, Rfl: 5 No current facility-administered medications for this visit.   Facility-Administered Medications Ordered in Other Visits:  .  gadopentetate dimeglumine  (MAGNEVIST) injection 20 mL, 20 mL, Intravenous, Once PRN, Dohmeier, Porfirio Mylar, MD  Allergies as of 06/16/2017 - Review Complete 06/16/2017  Allergen Reaction Noted  . Flonase [fluticasone] Shortness Of Breath 06/10/2017    1. Work and Family: He is now working at home, but will soon start disability.  2. Activities: He has not been very physically active for the  past several months.      3. Smoking, alcohol, or drugs: Occasional beer, about one-two per month. No tobacco or drugs. 4. Primary Care Provider: Dr. Guerry Bruin, MD, [facility]  REVIEW OF SYSTEMS: There are no other significant problems involving [name]'s other body systems.   Objective:  Vital Signs:  BP 116/70   Pulse 90   Wt 247 lb 9.6 oz (112.3 kg)   BMI 33.58 kg/m    Ht Readings from Last 3 Encounters:  06/04/17 6' (1.829 m)  05/28/17 6' (1.829 m)  04/14/17 6' (1.829 m)   Wt Readings from Last 3 Encounters:  06/16/17 247 lb 9.6 oz (112.3 kg)  06/04/17 230 lb (104.3 kg)  05/28/17 248 lb (112.5 kg)   PHYSICAL EXAM:  Constitutional: The patient appears obese and looks ill. His weight has increased 6 pounds. His affect is relatively flat. His insight is normal.  Face: The face appears normal.  Eyes: There is no obvious arcus or proptosis. Moisture appears normal. The conjunctivae are moderately inflamed, c/w allergic conjunctivitis. .  Mouth: The oropharynx and tongue appear normal. Oral moisture is normal. Neck: The neck appears to be visibly normal. No carotid bruits are noted. He has a short neck. His strap muscles are smaller. The thyroid gland is low lying and still enlarged, but smaller, at about 21 grams in size. The lobes are mildly but symmetrically enlarged today.  The consistency of the lobes is normal. The lobes are both tender to palpation today, more on the right today. The tenderness was less today than at his last visit.    Lungs: The lungs are clear to auscultation. Air movement  is good.  Heart: Heart rate and rhythm are regular. Heart sounds S1 and S2 are normal. I did not appreciate any pathologic cardiac murmurs. Abdomen: The abdomen is enlarged. Bowel sounds are normal. There is no obvious hepatomegaly, splenomegaly, or other mass effect.  Arms: Muscle size and bulk are normal for age. Hands: There is trace-to-1+ tremor of his hands today. Phalangeal and metacarpophalangeal joints are normal. Palmar muscles are normal. Palmar skin is normal. Palmar moisture is also normal. Legs: Muscles appear normal for age. No edema is present. Neurologic: Strength is normal for age in both the upper and lower extremities. Muscle tone is normal. Sensation to touch is normal in both legs and in both feet.     LAB DATA:   Labs 06/16/17: HbA1c 6.5%, CBG 193  Labs 05/15/17: CBG 209; TSH 0.90, free T4 1.2, free T3 3.8  Labs 03/05/17: HbA1c 7.3%, CBG 241  Labs 02/20/17: CBG 180 fasting  Labs 01/29/17: CBG 148  Labs 01/12/17: CBG 116  Labs 12/29/16: HbA1c 9.1%, CBG 223; TSH 0.62, free T4 1.1, free T3 3.5; urine microalbumin/creatinine ratio 22; cholesterol 171, triglycerides 93, HDL 40, LDL 112;  CMP glucose 201  Labs 09/26/16: HbA1c 9.5%  Labs 08/13/16: Celiac panel negative; CMP normal except for glucose 207; CBC normal  Labs 06/13/16: HbA1c 9.8%; LH 3.6, FSH 2.3, testosterone 533 (ref [id number]), free testosterone 59.4 (ref 47-244)  Labs 02/11/16: HbA1c 9.2%  Labs 01/07/16: HbA1c 10.5%; CBC with Hgb elevated at 17.2%; LH 3.9, FSH 2.0; TSH 1.53, free T4 1.3, free T3 3.7; cholesterol 262, triglycerides 171, HDL 40, LDL 188; urinary microalbumin/creatinine ratio 31; CMP normal except for glucose 181  Labs 06/29/15. HbA1c 9.6%  Labs 12/28/14: Hemoglobin A1c 9.5% today, compared with 9.5%, at last visit and with 9.9% at the prior visit.  Labs 08/03/14:  HbA1c 9.5%; TSH 0.548,free T4 1.10, free T3 3.7, TPO antibody < 1, TSI 30; CMP normal except glucose 191; urinary  microalbumin/creatinine ratio 27; cholesterol 260, triglycerides 90, HDL 51, LDL 191   Labs 10/05/13: HbA1c 9.9%  Labs 04/23/13: CMP normal, except glucose 144; cholesterol 216, triglycerides 97, HDL 39, LDL 158; urinary microalbumin/creatinine ratio 6.9; TSH 0.505, free T4 1.17, free T3 3.8  Labs: 01/21/12: TSH 1.251, free T4 1.10, free T3 3.6, CBC normal, CMP normal except for glucose 206, urinary microalbumin/creatinine ratio 6.3, cholesterol 187, triglycerides 67, HDL 45, LDL 129   Assessment and Plan:   ASSESSMENT:  1. T1DM: His BGs continue to improve on automode, although he is having more hypoglycemia in the afternoons when he is more active. He needs a lower ICR from 1 PM onward. Although he has been ill, the BGs have been surprisingly good.  The combination of the Medtronic 670G pump and Guardian sensor are really helping him achieve better BG control.  2. Hypoglycemia: He has had more documented low BGs and alerts.    3. Hypertension: BP is good today. He needs to continue to take meds regularly and to exercise regularly.  4. Combined hyperlipidemia: His lipids in March 2017 were still too high, but in March 2018 were much better on atorvastatin. He discontinued the atorvastatin due to concern that this medication might be causing his neuromuscular symptoms. We will follow up on this issue later.  5. Autonomic neuropathy with tachycardia and gastroparesis: His heart rate and postprandial bloating have normalized over time with better BG control.  6. Obesity: Weight has increased, predominantly due to lack of physical activity.       7-8. Goiter/thyroiditis:   A. His thyroid gland is about the same size today. The process of waxing and waning of thyroid gland size is c/w evolving Hashimoto's thyroiditis. His thyroiditis is clinically active bilaterally today, but more on the left lobe today. .    B. He was euthyroid in March 2013, but was borderline hyperthyroid in June 2014. He was  euthyroid in October 2015 and again in March 2017.  He was also euthyroid again in March 2018.  9. Fatigue: This problem has not improved.  10. Obstructive sleep apnea: He needs to move forward with this issue.   11. Adult ADD: He has an appointment with Dr. Piedad Climes, MD in [facility] again next week.  12. Proliferative diabetic retinopathy: This problem is due to his long period of poorly controled BGs. It will take at least 6-12 months of near-normal BGs for his retinopathy to stabilize.   13. URI/nasal congestion/vertigo/allergic rhinitis and conjunctivitis: He has been sick for several months, but seems to be slowly improving. He will see Dr. Lucie Leather again soon. I suspect that [name] may need allergy shots.  14. Neuromuscular symptoms: Dr. Percival Spanish did not find any signs or any serious neurologic disease. His MRI of his brain was unremarkable. Fortunately, his neuromuscular symptoms are less now.  15. Calcified lymph node: It is possible that he may have contracted a fungus while he was working in the [employer]. Several years ago. I want to obtain a thyroid [place] and [place] of that node.  PLAN:  1. Diagnostic:  Repeat CBG and HbA1c today. Call in two weeks on a Sunday or Wednesday evening. [place] of thyroid and node 2. Therapeutic: Follow DM care plan. Continue losartan. Continue his current pump basal rates, ISFs, and BG targets. New ICRs:   MN: 7 8 AM:  5 New 1 PM: 7 9 PM: 7 3. Patient education: We discussed the significant improvements in his BGs since being on the auto mode. We also discussed the need for him to be re-evaluated again by Drs Blane Ohara,  and Dohmeier.  I praised him again for doing such a great job of taking better care of his T1DM.  4. Follow-up:  1 month  Level of Service: This visit lasted in excess of 85 minutes. More than 50% of the visit was devoted to counseling.  Molli Knock, MD, CDE Adult and Pediatric Endocrinology  06/16/2017 10:11  AM

## 2017-06-17 ENCOUNTER — Ambulatory Visit
Admission: RE | Admit: 2017-06-17 | Discharge: 2017-06-17 | Disposition: A | Discharge status: Home / Self Care | Payer: 59 | Source: Ambulatory Visit | Attending: "Endocrinology | Admitting: "Endocrinology

## 2017-06-17 DIAGNOSIS — E049 Nontoxic goiter, unspecified: Secondary | ICD-10-CM

## 2017-06-17 DIAGNOSIS — I898 Other specified noninfective disorders of lymphatic vessels and lymph nodes: Secondary | ICD-10-CM

## 2017-06-18 ENCOUNTER — Ambulatory Visit (INDEPENDENT_AMBULATORY_CARE_PROVIDER_SITE_OTHER): Payer: 59 | Admitting: Neurology

## 2017-06-18 DIAGNOSIS — G4733 Obstructive sleep apnea (adult) (pediatric): Secondary | ICD-10-CM | POA: Diagnosis not present

## 2017-06-18 DIAGNOSIS — G629 Polyneuropathy, unspecified: Secondary | ICD-10-CM

## 2017-06-18 DIAGNOSIS — E133599 Other specified diabetes mellitus with proliferative diabetic retinopathy without macular edema, unspecified eye: Secondary | ICD-10-CM

## 2017-06-18 DIAGNOSIS — Z9641 Presence of insulin pump (external) (internal): Secondary | ICD-10-CM

## 2017-06-18 DIAGNOSIS — R251 Tremor, unspecified: Secondary | ICD-10-CM

## 2017-06-18 DIAGNOSIS — R29818 Other symptoms and signs involving the nervous system: Secondary | ICD-10-CM

## 2017-06-22 NOTE — Procedures (Signed)
PATIENT'S NAME:  [name], [name] DOB:      [birth year]      MR#:    [id number]     DATE OF RECORDING: 06/18/2017 REFERRING M.D.:  Guerry Bruin, MD Study Performed:   Baseline Polysomnogram HISTORY:  Patient is a 36 y.o. male, with juvenile onset diabetes, goiter, HTN, obesity and ADHD, autonomic neuropathy.  He is known to snore, and he had a home sleep test at [facility] last year [facility]) and was told that he has obstructive sleep apnea. Treatment was not initiated yet. He wakes up not feeling restored or refreshed, and he has noticed that his jittery illness affecting the right extremities and a feeling of inner trembling of his torso is the worst in the morning hours.  The patient endorsed the Epworth Sleepiness Scale at 7/24 points.   The patient's weight 248 pounds with a height of 72 (inches), resulting in a BMI of 33.4 kg/m2. The patient's neck circumference measured 21 inches.  CURRENT MEDICATIONS: Lipitor, Auvi-Q, Klonopin, Humalog, Losartan   PROCEDURE:  This is a multichannel digital polysomnogram utilizing the Somnostar 11.2 system.  Electrodes and sensors were applied and monitored per AASM Specifications.   EEG, EOG, Chin and Limb EMG, were sampled at 200 Hz.  ECG, Snore and Nasal Pressure, Thermal Airflow, Respiratory Effort, CPAP Flow and Pressure, Oximetry was sampled at 50 Hz. Digital video and audio were recorded.      BASELINE STUDY : Lights Out was at 22:27 and Lights On at 05:01.  Total recording time (TRT) was 394.5 minutes, with a total sleep time (TST) of 305 minutes.   The patient's sleep latency was 83.5 minutes.  REM latency was prolonged at 175 minutes.  The sleep efficiency was 77.3 %.     SLEEP ARCHITECTURE: WASO (Wake after sleep onset) was 20 minutes.  There were 4.5 minutes in Stage N1, 241.5 minutes Stage N2, 25.5 minutes Stage N3 and 33.5 minutes in Stage REM.  The percentage of Stage N1 was 1.5%, Stage N2 was 79.2%, Stage N3 was 8.4% and Stage  R (REM sleep) was 11.%.   RESPIRATORY ANALYSIS:  There were a total of 13 respiratory events:  5 obstructive apneas, 0 central apneas and 0 mixed apneas with a total of 5 apneas and an apnea index (AI) of 1. /hour. There were 8 hypopneas with a hypopnea index of 1.6 /hour. The patient also had 0 respiratory event related arousals (RERAs).     The total APNEA/HYPOPNEA INDEX (AHI) was 2.6/hour and the total RESPIRATORY DISTURBANCE INDEX was 2.6 /hour.  8 events occurred in REM sleep and 8 events in NREM. The REM AHI was 14.3 /hour, versus a non-REM AHI of 1.1. The patient spent 213.5 minutes of total sleep time in the supine position and 92 minutes in non-supine.. The supine AHI was 3.4 versus a non-supine AHI of 0.7.  OXYGEN SATURATION & C02:  The Wake baseline 02 saturation was 94%, with the lowest being 89%. Time spent below 89% saturation equaled 0 minutes.   PERIODIC LIMB MOVEMENTS:  The patient had a total of 35 Periodic Limb Movements.  The Periodic Limb Movement (PLM) index was 6.9/hr. The arousals were noted as: 60 were spontaneous, 2 were associated with PLMs, and 9 were associated with respiratory events.  Audio and video analysis did not show any unusual movements, behaviors, phonations or vocalizations.   The patient did not take bathroom breaks. There were PLMs.  Moderate Snoring was noted. EKG was  in keeping with normal sinus rhythm (NSR).  Post-study, the patient indicated that sleep was the same as usual.    IMPRESSION:  1. Periodic Limb Movement Disorder (PLMD) with few arousals.  2. Primary Snoring 3. Insignificant amount of Sleep Apnea.   RECOMMENDATIONS:  No need for CPAP intervention.  Snoring may be treated with weight loss and /or a dental device.   1. PLM: Avoid caffeine-containing beverages and chocolate. PLMs were only seen in NREM sleep. 2. Certain medications or substances may aggravate PLM. RLS and common offenders may include the following:  nicotine,  caffeine, SSRIs, TCAs, phenothiazine, dopamine antagonists, diphenhydramine, and alcohol.   3. A follow up appointment can be scheduled in the Sleep Clinic at [facility]. The referring provider will be notified of the results.      I certify that I have reviewed the entire raw data recording prior to the issuance of this report in accordance with the Standards of Accreditation of the American Academy of Sleep Medicine (AASM)   Melvyn Novas, MD  06-22-2017  Diplomat, American Board of Psychiatry and Neurology  Diplomat, American Board of Sleep Medicine Medical Director, [place] [facility] [employer]

## 2017-06-24 ENCOUNTER — Other Ambulatory Visit: Payer: Self-pay | Admitting: Allergy and Immunology

## 2017-06-25 ENCOUNTER — Telehealth: Payer: Self-pay | Admitting: Neurology

## 2017-06-25 NOTE — Telephone Encounter (Signed)
Called and discussed the sleep study results with the patient. I offered the dental referral for the dental device but at this time the patient doesn't wish to pursue that. I explained that weight loss can help with the snoring and that for the restlessness the patient should avoid caffeine containing products and chocolate before bed. Pt verbalized understanding and had no further questions at this time. I offered follow up apt but patient declined at this time.

## 2017-06-25 NOTE — Telephone Encounter (Signed)
-----   Message from Melvyn Novasarmen Dohmeier, MD sent at 06/22/2017  5:42 PM EDT ----- Mr [name] had no significant sleep apnea and only PLMs ,which did not affect REM sleep- this means he does not have REM behavior disorder. He will not need CPAP. He snored, and if interested can be treated by dental device and weight loss. He can see me for a referral to dentist or to medical weight management , no other intervention is needed.   Cc Dr Wylene Simmerisovec

## 2017-07-08 ENCOUNTER — Ambulatory Visit (INDEPENDENT_AMBULATORY_CARE_PROVIDER_SITE_OTHER): Payer: 59 | Admitting: Allergy and Immunology

## 2017-07-08 ENCOUNTER — Encounter: Payer: Self-pay | Admitting: Allergy and Immunology

## 2017-07-08 ENCOUNTER — Telehealth (INDEPENDENT_AMBULATORY_CARE_PROVIDER_SITE_OTHER): Payer: Self-pay | Admitting: "Endocrinology

## 2017-07-08 VITALS — BP 122/80 | HR 99 | Temp 97.6°F | Resp 18 | Ht 70.0 in | Wt 249.2 lb

## 2017-07-08 DIAGNOSIS — J4541 Moderate persistent asthma with (acute) exacerbation: Secondary | ICD-10-CM

## 2017-07-08 DIAGNOSIS — H6983 Other specified disorders of Eustachian tube, bilateral: Secondary | ICD-10-CM

## 2017-07-08 DIAGNOSIS — B999 Unspecified infectious disease: Secondary | ICD-10-CM

## 2017-07-08 DIAGNOSIS — J3089 Other allergic rhinitis: Secondary | ICD-10-CM

## 2017-07-08 DIAGNOSIS — Z91018 Allergy to other foods: Secondary | ICD-10-CM

## 2017-07-08 MED ORDER — AMOXICILLIN-POT CLAVULANATE 875-125 MG PO TABS: 1.0000 | ORAL_TABLET | Freq: Two times a day (BID) | ORAL | 0 refills | Status: DC

## 2017-07-08 MED ORDER — BUDESONIDE 180 MCG/ACT IN AEPB: INHALATION_SPRAY | RESPIRATORY_TRACT | 5 refills | Status: DC

## 2017-07-08 NOTE — Progress Notes (Signed)
Follow-up Note  Referring Provider: Gaspar Garbeisovec, Richard W, MD Primary Provider: Gaspar Garbeisovec, Richard W, MD Date of Office Visit: 07/08/2017  Subjective:   [name] (DOB: 11/07/1980) is a 36 y.o. male who [facility] in re-evaluation of the following:  HPI: Joseph BeachGabe presents to this clinic in evaluation of an event that occurred this past weekend. He had 2 hours of outdoor exposure this past Sunday and that night he developed headache and head fullness and nasal congestion and coughing and possibly some wheezing and feeling achy in general and having some tingling in his hands and feet. He did not have any anosmia or decreased ability to taste or ugly nasal discharge or chest pain or sputum production. His head feels a little bit better today. This developed even though he continued to use Nasacort and montelukast.  He continues to remain away from consumption of muscles and does have an epinephrine delivery device.  Allergies as of 07/08/2017      Reactions   Flonase [fluticasone] Shortness Of Breath      Medication List      AUVI-Q 0.3 mg/0.3 mL Soaj injection Generic drug:  EPINEPHrine Use as directed for life-threatening allergic reaction.   clonazePAM 0.5 MG tablet Commonly known as:  KLONOPIN Take 0.5 mg by mouth 2 (two) times daily as needed.   HUMALOG 100 UNIT/ML injection Generic drug:  insulin lispro INJECT 300 UNITS VIA INSULIN PUMP EVERY 48 TO 72 HOURS PER HYPERGLCYMIA AND DKA PROTOCOLS   losartan 50 MG tablet Commonly known as:  COZAAR TAKE 1 TABLET(50 MG) BY MOUTH DAILY   montelukast 10 MG tablet Commonly known as:  SINGULAIR Take 1 tablet (10 mg total) by mouth at bedtime.   ONE TOUCH ULTRA TEST test strip Generic drug:  glucose blood Check sugar 15 x daily       Past Medical History:  Diagnosis Date  . ADHD (attention deficit hyperactivity disorder)   . Autonomic neuropathy due to diabetes (HCC)   .  Diabetes (HCC)   . Fatigue   . Goiter   . High blood pressure   . High cholesterol   . Hypercholesterolemia   . Hypertension   . Hypoglycemia associated with diabetes (HCC)   . Obesity   . Tachycardia   . Type 1 diabetes mellitus not at goal [facility](HCC)   . Uncontrolled DM with microalbuminuria or microproteinuria     Past Surgical History:  Procedure Laterality Date  . none    . WISDOM TOOTH EXTRACTION      Review of systems negative except as noted in HPI / PMHx or noted below:  Review of Systems  Constitutional: Negative.   HENT: Negative.   Eyes: Negative.   Respiratory: Negative.   Cardiovascular: Negative.   Gastrointestinal: Negative.   Genitourinary: Negative.   Musculoskeletal: Negative.   Skin: Negative.   Neurological: Negative.   Endo/Heme/Allergies: Negative.   Psychiatric/Behavioral: Negative.      Objective:   Vitals:   07/08/17 1050  BP: 122/80  Pulse: 99  Resp: 18  Temp: 97.6 F (36.4 C)  SpO2: 95%   Height: 5\' 10"  (177.8 cm)  Weight: 249 lb 3.2 oz (113 kg)   Physical Exam  Constitutional: He is well-developed, well-nourished, and in no distress.  HENT:  Head: Normocephalic.  Right Ear: Tympanic membrane, external ear and ear canal normal.  Left Ear: Tympanic membrane, external ear and ear canal normal.  Nose: Mucosal edema (  erythematous) present. No rhinorrhea.  Mouth/Throat: Uvula is midline, oropharynx is clear and moist and mucous membranes are normal. No oropharyngeal exudate.  Eyes: Conjunctivae are normal.  Neck: Trachea normal. No tracheal tenderness present. No tracheal deviation present. No thyromegaly present.  Cardiovascular: Normal rate, regular rhythm, S1 normal, S2 normal and normal heart sounds.   No murmur heard. Pulmonary/Chest: Breath sounds normal. No stridor. No respiratory distress. He has no wheezes. He has no rales.  Musculoskeletal: He exhibits no edema.  Lymphadenopathy:       Head (right side): No tonsillar  adenopathy present.       Head (left side): No tonsillar adenopathy present.    He has no cervical adenopathy.  Neurological: He is alert. Gait normal.  Skin: No rash noted. He is not diaphoretic. No erythema. Nails show no clubbing.  Psychiatric: Mood and affect normal.    Diagnostics:    Spirometry was performed and demonstrated an FEV1 of 2.97 at 69 % of predicted.  Results of a sinus CT scan obtained 04/24/2017 identified the following:  Paranasal sinuses: Frontal: Mild mucosal edema bilaterally. Ethmoid: Moderate mucosal edema bilaterally Maxillary: Mucosal edema in the maxillary sinus bilaterally, left greater than right Sphenoid: Mild mucosal edema bilaterally. Mastoids: Clear Right ostiomeatal unit: Patent. Left ostiomeatal unit: Occluded due to mucosal edema Nasal passages: Patent. Intact nasal septum is midline. Other: Limited intracranial imaging negative.  Normal orbit.  Results of a chest x-ray obtained 02/28/2017 identified the following:  The heart size and mediastinal contours are within normal limits. Both lungs are clear. No pneumothorax or pleural effusion is noted. The visualized skeletal structures are unremarkable.   Assessment and Plan:   1. Asthma, not well controlled, moderate persistent, with acute exacerbation   2. Other allergic rhinitis   3. ETD (Eustachian tube dysfunction), bilateral   4. Food allergy   5. Recurrent infections     1. Allergen avoidance measures  2. Treat and prevent inflammation:   A. OTC Nasacort 1 spray each nostril once a day  B. montelukast 10 mg tablet once a day  C. Start Pulmicort 160 two inhalation two time per day  D. Prednisone  one time per day for three days only. Check blood sugars  3. Treat infection:    A. Augmentin 875 one tablet two times per day for 20 days  4. If needed:   A. nasal saline  B. OTC antihistamine  C. Auvi-Q 0.3  D. Proair HFA 2 inhalations every 4-6 hours  5.  Blood - CBC w/diff, IgA/G/M  6. Obtain fall flu vaccine  7. Return to clinic in 4 weeks or earlier if problem  It appears as though [name] has had a significant change in his respiratory status over the course of the past several months. I think we are obligated to rule out the possibility of a defect in his immune system contributing to his recurrent infections and recurrent respiratory tract symptoms. As well, he has very significant depressed lung function and I will treat him with anti-inflammatory agents for his lower respiratory tract as noted above. He does have type 1 diabetes and I had a talk with him today about the need to adjust his insulin administration while utilizing even this low dose of systemic steroid. I will contact him with the results of his blood tests once they are available for review and I will see him back in this clinic in 4 weeks or earlier if there is a problem.  Laurette Schimke, MD  Allergy / Immunology [facility]

## 2017-07-08 NOTE — Telephone Encounter (Signed)
Received telephone call from [name] 1. Overall status: He is still sick. He still has the URI symptoms. The allergist today put him on another round of Augmentin for 20 days. He is also taking prednisone, one 10 mg tablet per day for three days. He is also starting Pulmicort, twice daily, for 20 days. 2. New problems: None 3. Last site change: yesterday 4. Rapid-acting insulin: Humalog in his Medtronic 670G pump. His Guardian 3 sensor is working well. 5. BG log: 2 AM, Breakfast, Lunch, Supper, Bedtime 07/06/17: xxx, 113, 151/146/180, 128, 113 07/07/17: xxx, 132/171, 130, 97, 111 07/08/17: xxx, 122, 119, 211/155, pending - He has not been eating much this afternoon. 6. Assessment: BGs are doing quite well considering how sick he is and considering the use of steroids.  7. Plan: Continue the current plan. 8. FU call: Check in in about two weeks Molli KnockMichael Shaleah Nissley, MD, CDE

## 2017-07-08 NOTE — Patient Instructions (Addendum)
  1. Allergen avoidance measures  2. Treat and prevent inflammation:   A. OTC Nasacort 1 spray each nostril once a day  B. montelukast 10 mg tablet once a day  C. Start Pulmicort 180 two inhalation two time per day  D. Prednisone  one time per day for three days only. Check blood sugars  3. Treat infection:    A. Augmentin 875 one tablet two times per day for 20 days  4. If needed:   A. nasal saline  B. OTC antihistamine  C. Auvi-Q 0.3  D. Proair HFA 2 inhalations every 4-6 hours  5. Blood - CBC w/diff, IgA/G/M  6. Obtain fall flu vaccine  7. Return to clinic in 4 weeks or earlier if problem

## 2017-07-09 LAB — CBC WITH DIFFERENTIAL/PLATELET
BASOS ABS: 0 10*3/uL (ref 0.0–0.2)
BASOS: 1 %
EOS (ABSOLUTE): 0.1 10*3/uL (ref 0.0–0.4)
Eos: 1 %
Hematocrit: 46.9 % (ref 37.5–51.0)
Hemoglobin: 16.1 g/dL (ref 13.0–17.7)
IMMATURE GRANS (ABS): 0 10*3/uL (ref 0.0–0.1)
Immature Granulocytes: 1 %
LYMPHS: 27 %
Lymphocytes Absolute: 1.8 10*3/uL (ref 0.7–3.1)
MCH: 29.7 pg (ref 26.6–33.0)
MCHC: 34.3 g/dL (ref 31.5–35.7)
MCV: 87 fL (ref 79–97)
MONOS ABS: 0.5 10*3/uL (ref 0.1–0.9)
Monocytes: 8 %
NEUTROS ABS: 4.2 10*3/uL (ref 1.4–7.0)
Neutrophils: 62 %
PLATELETS: 232 10*3/uL (ref 150–379)
RBC: 5.42 x10E6/uL (ref 4.14–5.80)
RDW: 13.4 % (ref 12.3–15.4)
WBC: 6.6 10*3/uL (ref 3.4–10.8)

## 2017-07-09 LAB — IGG, IGA, IGM
IgA/Immunoglobulin A, Serum: 133 mg/dL (ref 90–386)
IgG (Immunoglobin G), Serum: 980 mg/dL (ref 700–1600)
IgM (Immunoglobulin M), Srm: 58 mg/dL (ref 20–172)

## 2017-07-10 ENCOUNTER — Telehealth: Payer: Self-pay | Admitting: Allergy and Immunology

## 2017-07-10 NOTE — Telephone Encounter (Signed)
Received a fax to do a PA for Pulmicort Flexhaler. PA was done and approved. Pharmacy stated that it would still cost $150. I called and spoke with patient about the cost to see if he would want to pay the $150 or if he would like to try and get the medication switched to another inhaler, he stated no that he could pay the $150.

## 2017-07-13 ENCOUNTER — Telehealth: Payer: Self-pay | Admitting: Allergy and Immunology

## 2017-07-13 ENCOUNTER — Other Ambulatory Visit (INDEPENDENT_AMBULATORY_CARE_PROVIDER_SITE_OTHER): Payer: Self-pay | Admitting: "Endocrinology

## 2017-07-13 DIAGNOSIS — E1065 Type 1 diabetes mellitus with hyperglycemia: Principal | ICD-10-CM

## 2017-07-13 DIAGNOSIS — IMO0001 Reserved for inherently not codable concepts without codable children: Secondary | ICD-10-CM

## 2017-07-13 NOTE — Telephone Encounter (Signed)
Patient is wanting lab results Are they available?

## 2017-07-13 NOTE — Telephone Encounter (Signed)
Patient aware of lab results.

## 2017-07-20 ENCOUNTER — Encounter (INDEPENDENT_AMBULATORY_CARE_PROVIDER_SITE_OTHER): Payer: Self-pay | Admitting: *Deleted

## 2017-07-20 ENCOUNTER — Ambulatory Visit (INDEPENDENT_AMBULATORY_CARE_PROVIDER_SITE_OTHER): Payer: 59 | Admitting: "Endocrinology

## 2017-07-20 ENCOUNTER — Encounter (INDEPENDENT_AMBULATORY_CARE_PROVIDER_SITE_OTHER): Payer: Self-pay | Admitting: "Endocrinology

## 2017-07-20 VITALS — BP 118/76 | HR 82 | Wt 249.4 lb

## 2017-07-20 DIAGNOSIS — E049 Nontoxic goiter, unspecified: Secondary | ICD-10-CM

## 2017-07-20 DIAGNOSIS — E10649 Type 1 diabetes mellitus with hypoglycemia without coma: Secondary | ICD-10-CM

## 2017-07-20 DIAGNOSIS — E669 Obesity, unspecified: Secondary | ICD-10-CM | POA: Diagnosis not present

## 2017-07-20 DIAGNOSIS — I10 Essential (primary) hypertension: Secondary | ICD-10-CM

## 2017-07-20 DIAGNOSIS — E063 Autoimmune thyroiditis: Secondary | ICD-10-CM | POA: Diagnosis not present

## 2017-07-20 DIAGNOSIS — I898 Other specified noninfective disorders of lymphatic vessels and lymph nodes: Secondary | ICD-10-CM | POA: Diagnosis not present

## 2017-07-20 DIAGNOSIS — J0191 Acute recurrent sinusitis, unspecified: Secondary | ICD-10-CM

## 2017-07-20 DIAGNOSIS — E1043 Type 1 diabetes mellitus with diabetic autonomic (poly)neuropathy: Secondary | ICD-10-CM

## 2017-07-20 DIAGNOSIS — E1065 Type 1 diabetes mellitus with hyperglycemia: Secondary | ICD-10-CM

## 2017-07-20 DIAGNOSIS — IMO0001 Reserved for inherently not codable concepts without codable children: Secondary | ICD-10-CM

## 2017-07-20 LAB — POCT GLUCOSE (DEVICE FOR HOME USE): POC GLUCOSE: 216 mg/dL — AB (ref 70–99)

## 2017-07-20 NOTE — Progress Notes (Signed)
Subjective:  Patient Name: [name] Date of Birth: [birth year]  MRN: [id number]  Lars Jeziorski  presents to the office today for follow-up of his type 1 diabetes mellitus, goiter, obesity, combined hyperlipidemia, adult ADHD, fatigue, autonomic neuropathy, tachycardia, hypertension, microalbuminuria, and hypoglycemia, plus a new sinusitis syndrome.  HISTORY OF PRESENT ILLNESS:   [name] is a 36 y.o. Caucasian gentleman. [name] was unaccompanied.  1. The patient was first referred to me on 01/08/2006 by his family physician, Dr. Roanna Epley, for evaluation and management of type 1 diabetes mellitus and related problems. The patient was 36 years old.  A. [name] had been diagnosed with type 1 diabetes somewhere in 2001-2002, at the age of 49-19. He was initially diagnosed with type 2 diabetes mellitus, but was later re-classified as type 1 diabetes mellitus. He had been followed by Dr. Arther Dames, staff endocrinologist at [facility]. [name] was started on an insulin pump approximately 18 months prior to first seeing me. His pump was a Medtronic Paradigm 712. His blood glucose control was fair-to-poor. He was frequently not checking blood sugars or taking insulin boluses as often as he needed to. He frequently noted fast heart rate. The patient's past medical history was positive for hypertension, for which he was taking lisinopril, and for what his mother called "extreme ADHD". He had previously been taking ADHD medicines but had discontinued them due to adverse effects. He had prior ankle injuries and knee injuries, to include tears of the lateral menisci bilaterally. He had not had any surgeries. He was working for a [employer] that had many different job sites in different parts of the [place] and in other countries as well. 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 He did not use tobacco or drugs, but did drink alcohol occasionally.  B. On physical  examination, his weight was 234 pounds, his height was 70 inches, his BMI was 33.2. Blood pressure was 128/70. Hemoglobin A1c was 9.5%. Heart rate was 96. He had normal affect and fair insight. He had a 25-30 gram goiter. He had normal 1+ DP pulses in his feet. He had normal sensation in his feet to touch, vibration, and monofilament. His CMP was normal except for glucose of 251. His cholesterol was 257, triglycerides 143, HDL 49, and LDL 179. TSH was 1.017, free T4 was 1.06, and free T3 was 3.3. His urinary microalbumin: creatinine ratio was 20.1 (normal less than 30).   C. The patient clearly needed better blood glucose control, which would only occur if he had better adherence to his plan. He appeared to have autonomic neuropathy, manifested by inappropriate sinus tachycardia.  His thyroid goiter suggested that he might have evolving Hashimoto's disease. He stated that he had lost some weight recently. I encouraged him to check his blood sugars more frequently and to take both correction boluses and food boluses.  2. 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 His hemoglobin A1c values have varied from 8.3-11.2%. In the last year, however, his A1c's have varied from 9.5-10.5%. Throughout the 11 year period, the patient's autonomic neuropathy and tachycardia have remained essentially the same. On 12/03/10 his total cholesterol was 270, triglycerides 94, HDL 50, and LDL 201. I started him on Crestor, 10 mg per day. He has since  begun treatment with atorvastatin and his cholesterol values have improved. In March 2018 he was converted to a Medtronic 670G pump and Guardian 3 CGM sensor. Since then his BGs have been much better. Unfortunately, he has also had a series of episodes of bronchitis and sinusitis, presumably due to  severe allergies, which have caused him to be very sick for much of the past six months.    3. The patient's last [facility] visit was on 06/16/17. At that visit I changed his ICRs. He believes that these changes helped his BG control initially, but not as well since he developed his new sinusitis and has been taking antibiotics and both nasal and inhaled steroids.  A.  In the interim he began feeling worse in early September. He saw Dr. Lucie Leather on 07/08/17, who diagnosed a recurrent sinusitis. [name] has ben treated with oral antibiotics, nasal steroids, and inhaled steroids.  B. He has remained off his eye drops, trazodone, Wellbutrin, and atorvastatin after seeing Dr. Darrick Penna on 04/14/17.   C. His nausea is not too bad. His spinning dizziness and vertigo have improved and are not as bad. He still has daily headaches, but often not too bad. The headaches remain bitemporal and posterior at times. His severe muscle pains resolved. Muscle twitching is much less frequent. His jitteriness and tremors are "a lot better", both less frequent and less severe. His eyes remain very sensitive to light. He had a follow up eye exam in July 2018 which showed that he is having some problems with eye muscle spasm.    D. He continues to have problems with lactose intolerance. When he avoids lactose, however, he is asymptomatic. Some foods such as shellfish cause his throat to swell up. He is tolerating losartan..     E. He is using Humalog aspart insulin in his insulin pump. He takes losartan, 50 mg/day.   F. He saw Dr. Vickey Huger in neurology on 05/28/17. His MRI was essentially normal.   G. He is still working from home. Due to his new illness his hours have been cut back. He may go on disability status soon.   H. He had a new sleep study on 06/18/17 that show 1 episode of obstructive apnea per hour and 1.6 episodes of hypopnea per hour. These findings were not felt to be significant for OSA. However, based upon the moderate amount of  snoring that he did, a dental appliance was recommended.   I. He feels a new mass in his left lateral neck at times.     4. Pertinent Review of Systems: Constitutional: The patient feels "not as bad". Eyes: As above. Last exam was on 05/04/17. He is due for follow up this afternoon.   Neck: He has not had had more anterior neck swelling and tenderness.  Heart: His heart rate has been elevated at times. The patient has no complaints of palpitations, irregular heat beats, chest pain, or chest pressure.  Gastrointestinal: He has some persistent.nausea. The patient has no other complaints of bloating after meals, excessive hunger, heartburn, acid reflux, upset stomach, stomach aches or pains, or constipation. Legs: As above. Muscle mass and strength seem normal. There are no other complaints of numbness, tingling, burning, or pain in the left foot. No edema is noted. Feet: As above. There are no obvious foot problems now. There are no other complaints of numbness, burning, or pain in the left foot.  No edema is noted. Hypoglycemia: He was doing well until the past week.  Emotional: He had a follow up appointment with Dr. Piedad Climes in [place] in late August. Dr. Shane Crutch will wait until [name] overall health situation stabilizes before trying any new medication regimen.     5. BG printout: We have data from the past 2 weeks. He changes sites every 3-6 days. He checks BGs 4-9 times daily. BGs varied from 87-304, compared with 58-250 at his last visit and with 95-337 at the prior visit. Average BG is 168, compared with 163 at his last visit and with 169 at the prior visit. His highest BGs occurred on one day when his new site was bad from the beginning.   6. CGM printout: Skin glucose values and BG values correlate very well. Average SG was 145, compared with 149 at his last visit and with 151 at his prior visit  He was again in auto mode 97% of the time. His highest SGs occurred around 1-2 PM and  again from 8 PM-1 AM. His lowest SGs from 4 PM-MN-4 AM.  PM.   PAST MEDICAL, FAMILY, AND SOCIAL HISTORY:  Past Medical History:  Diagnosis Date  . ADHD (attention deficit hyperactivity disorder)   . Autonomic neuropathy due to diabetes (HCC)   . Diabetes (HCC)   . Fatigue   . Goiter   . High blood pressure   . High cholesterol   . Hypercholesterolemia   . Hypertension   . Hypoglycemia associated with diabetes (HCC)   . Obesity   . Tachycardia   . Type 1 diabetes mellitus not at goal [facility])   . Uncontrolled DM with microalbuminuria or microproteinuria     Family History  Problem Relation Age of Onset  . Dementia Paternal Aunt   . Schizophrenia Paternal Aunt   . Cancer Maternal Grandmother   . Diabetes Maternal Grandmother        T2 DM  . Cancer Maternal Grandfather   . Thyroid disease Neg Hx      Current Outpatient Prescriptions:  .  amoxicillin-clavulanate (AUGMENTIN) 875-125 MG tablet, Take 1 tablet by mouth 2 (two) times daily., Disp: 40 tablet, Rfl: 0 .  AUVI-Q 0.3 MG/0.3ML SOAJ injection, Use as directed for life-threatening allergic reaction., Disp: 4 Device, Rfl: 2 .  budesonide (PULMICORT FLEXHALER) 180 MCG/ACT inhaler, Inhale two doses into the lungs twice daily to prevent cough or wheeze.  Rinse, gargle, and spit after use., Disp: 1 Inhaler, Rfl: 5 .  HUMALOG 100 UNIT/ML injection, INJECT 300 UNITS VIA INSULIN PUMP EVERY 48 TO 72 HOURS PER HYPERGLYCEMIA AND DKA PROTOCOLS., Disp: 40 mL, Rfl: 5 .  losartan (COZAAR) 50 MG tablet, TAKE 1 TABLET(50 MG) BY MOUTH DAILY, Disp: 30 tablet, Rfl: 4 .  montelukast (SINGULAIR) 10 MG tablet, Take 1 tablet (10 mg total) by mouth at bedtime., Disp: 30 tablet, Rfl: 5 .  ONE TOUCH ULTRA TEST test strip, Check sugar 15 x daily, Disp: 450 each, Rfl: 3 .  clonazePAM (KLONOPIN) 0.5 MG tablet, Take 0.5 mg by mouth 2 (two) times daily as needed., Disp: , Rfl: 0 No current facility-administered medications for this visit.    Facility-Administered Medications Ordered in Other Visits:  .  gadopentetate dimeglumine (MAGNEVIST) injection 20 mL, 20 mL, Intravenous, Once PRN, Dohmeier, Porfirio Mylar, MD  Allergies as of 07/20/2017 - Review Complete 07/20/2017  Allergen Reaction Noted  . Flonase [fluticasone] Shortness Of Breath 06/10/2017    1. Work and Family: He is now working at home at [employer]. 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  2. Activities: He has not been very physically active for the past several months.      3. Smoking, alcohol, or drugs: Occasional beer, about one-two per month. No tobacco or drugs. 4. Primary Care Provider: Dr. Guerry Bruin, MD, [facility]  REVIEW OF SYSTEMS: There are no other significant problems involving Vang's other body systems.   Objective:  Vital Signs:  BP 118/76   Pulse 82   Wt 249 lb 6.4 oz (113.1 kg)   BMI 35.79 kg/m    Ht Readings from Last 3 Encounters:  07/08/17  (1.778 m)  06/04/17 6' (1.829 m)  05/28/17 6' (1.829 m)   Wt Readings from Last 3 Encounters:  07/20/17 249 lb 6.4 oz (113.1 kg)  07/08/17 249 lb 3.2 oz (113 kg)  06/16/17 247 lb 9.6 oz (112.3 kg)   PHYSICAL EXAM:  Constitutional: The patient appears obese and looks ill, but a bit better than at his last visit. His weight is unchanged. His affect is relatively flat. His insight is normal. He coughs frequently. Face: The face appears normal.  Eyes: There is no obvious arcus or proptosis. Moisture appears normal. The lower eyelids are moderately swollen, c/w allergic conjunctivitis. .  Mouth: The oropharynx and tongue appear normal. Oral moisture is normal. Neck: The neck appears to be visibly normal. No carotid bruits are noted. He has a short neck. His strap muscles are smaller. The thyroid gland is low lying and still enlarged, but smaller, at about 21 grams in size. The right lobe has shrunk back almost to normal size, but the left lobe is  still enlarged. The consistency of the lobes is normal. The lobes are both tender to palpation today, more so on the left today.  Lungs: The lungs are clear to auscultation. Air movement is good.  Heart: Heart rate and rhythm are regular. Heart sounds S1 and S2 are normal. I did not appreciate any pathologic cardiac murmurs. Abdomen: The abdomen is enlarged. Bowel sounds are normal. There is no obvious hepatomegaly, splenomegaly, or other mass effect.  Arms: Muscle size and bulk are normal for age. Hands: There is no tremor of his hands today. Phalangeal and metacarpophalangeal joints are normal. Palmar muscles are normal. Palmar skin is normal. Palmar moisture is also normal. Legs: Muscles appear normal for age. No edema is present. Neurologic: Strength is normal for age in both the upper and lower extremities. Muscle tone is normal. Sensation to touch is normal in both legs.     LAB DATA:   Labs 07/20/17: CBG 216  Labs 07/08/17: IgA, IgG, and IgM normal. CBC normal.  Labs 06/16/17: HbA1c 6.5%, CBG 193  Labs 05/15/17: CBG 209; TSH 0.90, free T4 1.2, free T3 3.8  Labs 03/05/17: HbA1c 7.3%, CBG 241  Labs 02/20/17: CBG 180 fasting  Labs 01/29/17: CBG 148  Labs 01/12/17: CBG 116  Labs 12/29/16: HbA1c 9.1%, CBG 223; TSH 0.62, free T4 1.1, free T3 3.5; urine microalbumin/creatinine ratio 22; cholesterol 171, triglycerides 93, HDL 40, LDL 112;  CMP glucose 201  Labs 09/26/16: HbA1c 9.5%  Labs 08/13/16: Celiac panel negative; CMP normal except for glucose 207; CBC normal  Labs 06/13/16: HbA1c 9.8%; LH 3.6, FSH 2.3, testosterone 533 (ref [id number]), free testosterone 59.4 (ref 47-244)  Labs 02/11/16: HbA1c 9.2%  Labs 01/07/16: HbA1c 10.5%; CBC with Hgb elevated at 17.2%; LH 3.9, FSH 2.0; TSH 1.53, free T4 1.3, free T3 3.7; cholesterol 262, triglycerides 171, HDL 40,  LDL 188; urinary microalbumin/creatinine ratio 31; CMP normal except for glucose 181  Labs 06/29/15. HbA1c 9.6%  Labs 12/28/14:  Hemoglobin A1c 9.5% today, compared with 9.5%, at last visit and with 9.9% at the prior visit.  Labs 08/03/14: HbA1c 9.5%; TSH 0.548,free T4 1.10, free T3 3.7, TPO antibody < 1, TSI 30; CMP normal except glucose 191; urinary microalbumin/creatinine ratio 27; cholesterol 260, triglycerides 90, HDL 51, LDL 191   Labs 10/05/13: HbA1c 9.9%  Labs 04/23/13: CMP normal, except glucose 144; cholesterol 216, triglycerides 97, HDL 39, LDL 158; urinary microalbumin/creatinine ratio 6.9; TSH 0.505, free T4 1.17, free T3 3.8  Labs: 01/21/12: TSH 1.251, free T4 1.10, free T3 3.6, CBC normal, CMP normal except for glucose 206, urinary microalbumin/creatinine ratio 6.3, cholesterol 187, triglycerides 67, HDL 45, LDL 129   Assessment and Plan:   ASSESSMENT:  1. T1DM: His BGs were continuing to improve on auto mode, but have been higher and more variable since developing his new sinusitis and being treated with nasal and inhaled steroids. He needs a higher ICR from 1 PM to 9 PM for now. Although he has been ill, the BGs have been much more controled than they were with similar illnesses before he started on the 670G pump. The combination of the Medtronic 670G pump and Guardian sensor are still helping him achieve better BG control.  2. Hypoglycemia: He has had several low SGs in the afternoons and evenings recently after site changes.     3. Hypertension: BP is good today. He needs to continue to take meds regularly and to exercise regularly when he feels well enough to do so.  4. Combined hyperlipidemia: His lipids in March 2017 were too high, but the lipids in March 2018 were much better on atorvastatin. He discontinued the atorvastatin due to concern that this medication might be causing his neuromuscular symptoms. We will follow up on this issue later.  5. Autonomic neuropathy with tachycardia and gastroparesis: His heart rate and postprandial bloating have normalized over time with better BG control.  6.  Obesity: Weight has been stable in the past month.        7-8. Goiter/thyroiditis:   A. His thyroid gland is slightly larger today. The process of waxing and waning of thyroid gland size is c/w evolving Hashimoto's thyroiditis. His thyroiditis is clinically active bilaterally today, but more on the left lobe today.     B. He was euthyroid in March 2013, but was borderline hyperthyroid in June 2014. He was euthyroid in October 2015 and again in March 2017.  He was also euthyroid again in March 2018.  9. Fatigue: This problem has not improved.  10. Obstructive sleep apnea: He did not meet the criteria for OSA, but because of his snoring a dental appliance was suggested.    11. Adult ADD: He had an appointment with Dr. Piedad Climes, MD in [place] again last month. Dr. Shane Crutch is waiting for [name] medical situation to stabilize before beginning any new psych meds.   12. Proliferative diabetic retinopathy: This problem is due to his long period of poorly controled BGs. It will take at least 6-12 months of near-normal BGs for his retinopathy to stabilize.  He will have follow up exam this afternoon.  13. URI/nasal congestion/vertigo/allergic rhinitis and conjunctivitis: He has been sick for several months, but seems to be slowly improving. He will see Dr. Lucie Leather again soon. I suspect that [name] may need allergy shots.  14. Neuromuscular symptoms: Dr. Vickey Huger  did not find any signs or any serious neurologic disease. His MRI of his brain was unremarkable. Fortunately, his neuromuscular symptoms are less now.  15. Calcified lymph node: It is possible that he may have contracted a fungus while he was working in the [employer]. Several years ago. His thyroid US showed his goiter, but no intrinsic thyroid abnormality. A partially calcified cervical lymph node on the right was noted, c/w a post infections/inflammatory node.  PLAN:  1. Diagnostic:  Repeat CBG  today. Call in two weeks on a Sunday or  Wednesday evening.  2. Therapeutic: Follow DM care plan. Continue losartan. Continue his current pump basal rates, ISFs, and BG targets. New ICRs:   MN: 7 8 AM: 5 1 PM: 7 -> 6 9 PM: 7 3. Patient education: We discussed the significant improvements in his BGs since being on the auto mode. We also discussed the need for him to be followed closely by Drs Lucie Leather and Shane Crutch.  I praised [name] for trying so hard to take care of his DM even e\when he's been sick.   4. Follow-up:  1 month  Level of Service: This visit lasted in excess of 65 minutes. More than 50% of the visit was devoted to counseling.  Molli Knock, MD, CDE Adult and Pediatric Endocrinology  07/20/2017 10:35 AM

## 2017-07-20 NOTE — Patient Instructions (Addendum)
Follow up visit in one month. Please call Dr. Fransico Jailyn Leeson in two weeks on a Wednesday or Sunday evening to discuss BGs.

## 2017-07-21 NOTE — Progress Notes (Signed)
I agree with the assessment and plan as directed by Dr. Fransico Michael  .The patient is known to me .   Bruna Dills, MD

## 2017-08-05 ENCOUNTER — Encounter: Payer: Self-pay | Admitting: Allergy and Immunology

## 2017-08-05 ENCOUNTER — Ambulatory Visit (INDEPENDENT_AMBULATORY_CARE_PROVIDER_SITE_OTHER): Payer: 59 | Admitting: Allergy and Immunology

## 2017-08-05 VITALS — BP 132/88 | HR 100 | Resp 16

## 2017-08-05 DIAGNOSIS — B999 Unspecified infectious disease: Secondary | ICD-10-CM | POA: Diagnosis not present

## 2017-08-05 DIAGNOSIS — J3089 Other allergic rhinitis: Secondary | ICD-10-CM | POA: Diagnosis not present

## 2017-08-05 DIAGNOSIS — H6983 Other specified disorders of Eustachian tube, bilateral: Secondary | ICD-10-CM | POA: Diagnosis not present

## 2017-08-05 DIAGNOSIS — Z91018 Allergy to other foods: Secondary | ICD-10-CM

## 2017-08-05 DIAGNOSIS — J4540 Moderate persistent asthma, uncomplicated: Secondary | ICD-10-CM

## 2017-08-05 MED ORDER — MUPIROCIN 2 % EX OINT: 1.0000 "application " | TOPICAL_OINTMENT | Freq: Two times a day (BID) | CUTANEOUS | 0 refills | Status: AC

## 2017-08-05 NOTE — Patient Instructions (Addendum)
  1. Continue to Allergen avoidance measures as best as possible  2. Treat and prevent inflammation:   A. OTC Nasacort 1 spray each nostril 3-7 times per week  B. montelukast 10 mg tablet once a day  C. Pulmicort 180 two inhalation two time per day  3. Treat infection:    A. Bactroban apply inside nose three times a day for 10 days  4. Treat reflux:   A. OTC ranitidine 150 two tablets one time per day  4. If needed:   A. nasal saline  B. OTC antihistamine  C. Auvi-Q 0.3  D. Proair HFA 2 inhalations every 4-6 hours  5. Call with update at end of 10 days of bactroban  6. Return to clinic in 12 weeks or earlier if problem

## 2017-08-05 NOTE — Progress Notes (Signed)
Follow-up Note  Referring Provider: Gaspar Garbe, MD Primary Provider: Gaspar Garbe, MD Date of Office Visit: 08/05/2017  Subjective:   [name] (DOB: [birth year]) is a 36 y.o. male who [facility] in re-evaluation of the following:  HPI: [name] returns to this clinic in reevaluation of what appeared to be a prolonged episode of sinusitis as well as respiratory tract inflammation affecting both his upper and lower airway addressed during his last visit of 07/08/2017.  While utilizing 20 days of Augmentin he did better initially but since he discontinued this Augmentin he feels as though he has become sick once again with rather persistent nasal congestion. In addition, he has developed some nausea and some occasional dizziness upon awakening in the morning and he also hears some "rushing air" noise in his ears, predominantly his right ear in the morning. This last several minutes. He has had a very slight cough. He has not had any wheezing. He still feels achy in general.  Allergies as of 08/05/2017      Reactions   Flonase [fluticasone] Shortness Of Breath      Medication List      AUVI-Q 0.3 mg/0.3 mL Soaj injection Generic drug:  EPINEPHrine Use as directed for life-threatening allergic reaction.   budesonide 180 MCG/ACT inhaler Commonly known as:  PULMICORT FLEXHALER Inhale two doses into the lungs twice daily to prevent cough or wheeze.  Rinse, gargle, and spit after use.   clonazePAM 0.5 MG tablet Commonly known as:  KLONOPIN Take 0.5 mg by mouth 2 (two) times daily as needed.   HUMALOG 100 UNIT/ML injection Generic drug:  insulin lispro INJECT 300 UNITS VIA INSULIN PUMP EVERY 48 TO 72 HOURS PER HYPERGLYCEMIA AND DKA PROTOCOLS.   losartan 50 MG tablet Commonly known as:  COZAAR TAKE 1 TABLET(50 MG) BY MOUTH DAILY   montelukast 10 MG tablet Commonly known as:  SINGULAIR Take 1 tablet (10 mg total)  by mouth at bedtime.   ONE TOUCH ULTRA TEST test strip Generic drug:  glucose blood Check sugar 15 x daily       Past Medical History:  Diagnosis Date  . ADHD (attention deficit hyperactivity disorder)   . Autonomic neuropathy due to diabetes (HCC)   . Diabetes (HCC)   . Fatigue   . Goiter   . High blood pressure   . High cholesterol   . Hypercholesterolemia   . Hypertension   . Hypoglycemia associated with diabetes (HCC)   . Obesity   . Tachycardia   . Type 1 diabetes mellitus not at goal [facility])   . Uncontrolled DM with microalbuminuria or microproteinuria     Past Surgical History:  Procedure Laterality Date  . none    . WISDOM TOOTH EXTRACTION      Review of systems negative except as noted in HPI / PMHx or noted below:  Review of Systems  Constitutional: Negative.   HENT: Negative.   Eyes: Negative.   Respiratory: Negative.   Cardiovascular: Negative.   Gastrointestinal: Negative.   Genitourinary: Negative.   Musculoskeletal: Negative.   Skin: Negative.   Neurological: Negative.   Endo/Heme/Allergies: Negative.   Psychiatric/Behavioral: Negative.      Objective:   Vitals:   08/05/17 1045  BP: 132/88  Pulse: 100  Resp: 16  SpO2: 98%          Physical Exam  Constitutional: He is well-developed, well-nourished, and in no distress.  HENT:  Head: Normocephalic.  Right Ear: Tympanic membrane, external ear and ear canal normal.  Left Ear: Tympanic membrane, external ear and ear canal normal.  Nose: Nose normal. No mucosal edema (Erythematous) or rhinorrhea.  Mouth/Throat: Uvula is midline, oropharynx is clear and moist and mucous membranes are normal. No oropharyngeal exudate.  Eyes: Conjunctivae are normal.  Neck: Trachea normal. No tracheal tenderness present. No tracheal deviation present. No thyromegaly present.  Cardiovascular: Normal rate, regular rhythm, S1 normal, S2 normal and normal heart sounds.   No murmur heard. Pulmonary/Chest:  Breath sounds normal. No stridor. No respiratory distress. He has no wheezes. He has no rales.  Musculoskeletal: He exhibits no edema.  Lymphadenopathy:       Head (right side): No tonsillar adenopathy present.       Head (left side): No tonsillar adenopathy present.    He has no cervical adenopathy.  Neurological: He is alert. Gait normal.  Skin: No rash noted. He is not diaphoretic. No erythema. Nails show no clubbing.  Psychiatric: Mood and affect normal.    Diagnostics:    Spirometry was performed and demonstrated an FEV1 of 3.34 at 77 % of predicted.   Results of blood test obtained 07/08/2017 identifies a IgG 980 MG/DL, IgM 58 MG/DL, IgA 657 MG/DL, WBC 6.6, absolute eosinophil 100, absolute lymphocyte 1800, hemoglobin 16.1, platelet 232.  Assessment and Plan:   1. Not well controlled moderate persistent asthma   2. Other allergic rhinitis   3. ETD (Eustachian tube dysfunction), bilateral   4. Recurrent infections   5. Food allergy     1. Continue to Allergen avoidance measures as best as possible  2. Treat and prevent inflammation:   A. OTC Nasacort 1 spray each nostril 3-7 times per week  B. montelukast 10 mg tablet once a day  C. Pulmicort 180 two inhalation two time per day  3. Treat infection:    A. Bactroban apply inside nose three times a day for 10 days  4. Treat reflux:   A. OTC ranitidine 150 two tablets one time per day  4. If needed:   A. nasal saline  B. OTC antihistamine  C. Auvi-Q 0.3  D. Proair HFA 2 inhalations every 4-6 hours  5. Call with update at end of 10 days of bactroban  6. Return to clinic in 12 weeks or earlier if problem  I will assume that [name] will respond to topical Bactroban as he may have some form of infectious rhinitis. I did review his CT scan from earlier this year and there was some degree of ethmoidal inflammation and obstruction but the rest of the sinus cavities looked relatively well. He will contact me in 10 days  noting his response to this approach. If he still continues to remain symptomatic we may need to image his airway once again. I will give him ranitidine for his nausea. He will continue to use anti-inflammatory agents for both his upper and lower airway at this point. If he remains with systemic symptoms such as achiness then we may need to evaluate him for a systemic disease above and beyond that being addressed today.  Laurette Schimke, MD Allergy / Immunology [facility]

## 2017-08-09 ENCOUNTER — Telehealth (INDEPENDENT_AMBULATORY_CARE_PROVIDER_SITE_OTHER): Payer: Self-pay | Admitting: "Endocrinology

## 2017-08-09 NOTE — Telephone Encounter (Signed)
Received telephone call from [name] 1. Overall status: He still feels "kinda lousy", a bit worse. His URI and chest symptoms are worse today. He had severe vertigo yesterday morning. BP was high at Urgent Care. He was given injections of ceftriaxone and diphenhydramine and is on a Z-pack and Xyzol now. He is allergic to birch, trees, molds, and other allergens. 2. New problems: None 3. Last site change: this morning 4. Rapid-acting insulin: Humalog in his [employer]onic 670G pump 5. BG log: 2 AM, Breakfast, Lunch, Supper, Bedtime 08/07/17: 109, 147, 141,158, 111 08/08/17: 140, 138, may have missed food bolus/doctor visit and injection/255/228, 191, 149 08/09/17: xxx, 124, 140/site change/156/122, 118, pending 6. Assessment: [name], the 670G pump and Guardian 3 CGM are doing a beautiful job of controlling BGs. 7. Plan: Continue your current T1DM care plan. Call Dr Lucie Leather.  8. FU call: Clinic visit 08/24/17 Molli Knock, MD, CDE

## 2017-08-10 ENCOUNTER — Telehealth: Payer: Self-pay | Admitting: Allergy and Immunology

## 2017-08-10 NOTE — Telephone Encounter (Signed)
Please inform patient that it is okay to utilize the therapy prescribed by primary care doctor but if he does not have a complete response over the next several weeks then we do need to step ahead and think about something else is going on that could be contributing to his symptoms. Please have him contact me noting his response to this approach.

## 2017-08-10 NOTE — Telephone Encounter (Signed)
Informed patient to go ahead with PCP medications and to call the office in a couple weeks with his response.

## 2017-08-10 NOTE — Telephone Encounter (Signed)
[name] called in and stated he was still feeling bad so he went to a Doctor over the weekend.  That doctor stated he felt [name] had an upper respiratory infection and administered DIPHENHYDRAMINE and CEFTRIAXONE while in office.  The doctor also prescribed AZITHROMYCIN and XYZAL.  [name] is wanting to know is the AZITHROMYCIN and XYZAL ok to take in addition to what Dr. Lucie Leather has given, or should he take something else?  Please advise.

## 2017-08-24 ENCOUNTER — Encounter (INDEPENDENT_AMBULATORY_CARE_PROVIDER_SITE_OTHER): Payer: Self-pay | Admitting: "Endocrinology

## 2017-08-24 ENCOUNTER — Ambulatory Visit (INDEPENDENT_AMBULATORY_CARE_PROVIDER_SITE_OTHER): Payer: 59 | Admitting: "Endocrinology

## 2017-08-24 VITALS — BP 120/76 | HR 78 | Wt 249.2 lb

## 2017-08-24 DIAGNOSIS — R000 Tachycardia, unspecified: Secondary | ICD-10-CM | POA: Diagnosis not present

## 2017-08-24 DIAGNOSIS — E049 Nontoxic goiter, unspecified: Secondary | ICD-10-CM | POA: Diagnosis not present

## 2017-08-24 DIAGNOSIS — IMO0001 Reserved for inherently not codable concepts without codable children: Secondary | ICD-10-CM

## 2017-08-24 DIAGNOSIS — E1065 Type 1 diabetes mellitus with hyperglycemia: Secondary | ICD-10-CM

## 2017-08-24 DIAGNOSIS — I10 Essential (primary) hypertension: Secondary | ICD-10-CM

## 2017-08-24 DIAGNOSIS — E1043 Type 1 diabetes mellitus with diabetic autonomic (poly)neuropathy: Secondary | ICD-10-CM | POA: Diagnosis not present

## 2017-08-24 DIAGNOSIS — E063 Autoimmune thyroiditis: Secondary | ICD-10-CM

## 2017-08-24 DIAGNOSIS — R5383 Other fatigue: Secondary | ICD-10-CM

## 2017-08-24 DIAGNOSIS — E10649 Type 1 diabetes mellitus with hypoglycemia without coma: Secondary | ICD-10-CM | POA: Diagnosis not present

## 2017-08-24 LAB — POCT GLUCOSE (DEVICE FOR HOME USE): Glucose Fasting, POC: 117 mg/dL — AB (ref 70–99)

## 2017-08-24 NOTE — Patient Instructions (Signed)
Follow up visit in one month.  

## 2017-08-24 NOTE — Progress Notes (Signed)
Subjective:  Patient Name: [name] Date of Birth: [birth year]  MRN: [id number]  [name]  presents to the office today for follow-up of his type 1 diabetes mellitus, goiter, obesity, combined hyperlipidemia, adult ADHD, fatigue, autonomic neuropathy, tachycardia, hypertension, microalbuminuria, and hypoglycemia, sinusitis, bronchitis, and allergies.  HISTORY OF PRESENT ILLNESS:   [name] is a 36 y.o. Caucasian gentleman. [name] was unaccompanied.  1. The patient was first referred to me on 01/08/2006 by his family physician, Dr. Roanna Epley, for evaluation and management of type 1 diabetes mellitus and related problems. The patient was 36 years old.  A. [name] had been diagnosed with type 1 diabetes somewhere in 2001-2002, at the age of 75-19. He was initially diagnosed with type 2 diabetes mellitus, but was later re-classified as type 1 diabetes mellitus. He had been followed by Dr. Arther Dames, staff endocrinologist at [facility]. [name] was started on an insulin pump approximately 18 months prior to first seeing me. His pump was a Medtronic Paradigm 712. His blood glucose control was fair-to-poor. He was frequently not checking blood sugars or taking insulin boluses as often as he needed to. He frequently noted fast heart rate. The patient's past medical history was positive for hypertension, for which he was taking lisinopril, and for what his mother called "extreme ADHD". He had previously been taking ADHD medicines but had discontinued them due to adverse effects. He had prior ankle injuries and knee injuries, to include tears of the lateral menisci bilaterally. He had not had any surgeries. He was working for a [employer] that had many different job sites in different parts of the [place] and in other countries as well. 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 He did not use tobacco or drugs, but did drink alcohol occasionally.  B. On  physical examination, his weight was 234 pounds, his height was 70 inches, his BMI was 33.2. Blood pressure was 128/70. Hemoglobin A1c was 9.5%. Heart rate was 96. He had normal affect and fair insight. He had a 25-30 gram goiter. He had normal 1+ DP pulses in his feet. He had normal sensation in his feet to touch, vibration, and monofilament. His CMP was normal except for glucose of 251. His cholesterol was 257, triglycerides 143, HDL 49, and LDL 179. TSH was 1.017, free T4 was 1.06, and free T3 was 3.3. His urinary microalbumin: creatinine ratio was 20.1 (normal less than 30).   C. The patient clearly needed better blood glucose control, which would only occur if he had better adherence to his plan. He appeared to have autonomic neuropathy, manifested by inappropriate sinus tachycardia.  His thyroid goiter suggested that he might have evolving Hashimoto's disease. He stated that he had lost some weight recently. I encouraged him to check his blood sugars more frequently and to take both correction boluses and food boluses.  2. 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 His hemoglobin A1c values have varied from 8.3-11.2%. In the last year, however, his A1c's have varied from 9.5-10.5%. Throughout the 11 year period, the patient's autonomic neuropathy and tachycardia have remained essentially the same. On 12/03/10 his total cholesterol was 270, triglycerides 94, HDL 50, and LDL 201. I started him on Crestor, 10 mg per day. He has since begun  treatment with atorvastatin and his cholesterol values have improved. In March 2018 he was converted to a Medtronic 670G pump and Guardian 3 CGM sensor. Since then his BGs have been much better. Unfortunately, he has also had a series of episodes of bronchitis and sinusitis,  presumably due to severe allergies, which have caused him to be very sick for much of the past six months.    3. The patient's last [facility] visit was on 07/20/17. At that visit I changed his ICRs. He believes that these changes helped his BG control. He remains on Pulmicort and Singulair daily. His allergies have been better. He no longer has an active sinusitis/bronchitis.   A.  He saw Dr. [name] on 10/10/1 Joseph BeachGabe also saw an ENT specialists on 08/22/17 who conducted an initial evaluation and who feels that Joseph BeachGabe has asthma and needs both an inhaler and nasal steroids daily.   B. He has remained off his eye drops, trazodone, Wellbutrin, and atorvastatin since seeing Dr. Darrick PennaFields on 04/14/17.   C. His nausea is a little better. His spinning dizziness and vertigo have improved, but still come and go.  He still has daily headaches, but often not too bad. The headaches remain bitemporal and posterior at times. His muscle pains also come and go, but are not as severe. His jitteriness and tremors are "better", both less frequent and less severe. His eyes remain sensitive to light, but not as sensitive as at his last visit. At his eye exam in July 2018 he was having some problems with eye muscle spasm.    D. He continues to have problems with lactose intolerance. When he avoids lactose, however, he is asymptomatic. Some foods such as shellfish cause his throat to swell up.   E. He is using Humalog aspart insulin in his insulin pump. He takes losartan, 50 mg/day.   F. He saw Dr. Vickey Hugerohmeier in neurology on 05/28/17. His MRI was essentially normal.   G. He is still working from home. Due to his upper respiratory illness his hours have been cut back.   H. He had a sleep study on 06/18/17 that show 1 episode of obstructive apnea per hour and 1.6 episodes of hypopnea per hour. These findings were not felt to be significant for OSA. However, based upon the moderate amount of snoring that he did, a dental appliance was recommended.  He has not pursued that recommendation yet.   I. He no longer feels a mass in his left lateral neck at times.     4. Pertinent Review of Systems: Constitutional: The patient feels "better, but still not the way I used to be". Eyes: Last exam was on 07/20/17. He is due for follow up next week.   Neck: He has not had had any more anterior neck swelling and tenderness.  Heart: His heart rate has been elevated at times. The patient has no complaints of palpitations, irregular heat beats, chest pain, or chest pressure.  Gastrointestinal: He has much less nausea. The patient has no other complaints of bloating after meals, excessive hunger, heartburn, acid reflux, upset stomach, stomach aches or pains, or constipation. Legs: As above. Muscle mass and strength seem normal. He occasionally has pains in his right quadriceps muscle. There are no other complaints of numbness, tingling, burning, or pain. No edema is noted. Feet: There are no obvious foot problems now. There are no complaints of numbness, burning, or pain in the left foot.  No edema is noted. Hypoglycemia: He was doing  well until the past week.  Emotional: He had a follow up appointment with Dr. Piedad Climes in [place] in late August. Dr. Shane Crutch will wait until [name]'s overall health situation stabilizes before trying any new psych medications.     5. BG printout: We have data from the past 2 weeks. He changes sites every 2-3 days. He checks BGs 7-9 times daily. BGs varied from 82-294, compared with 87-304 at his last visit and with 58-250 at the prior visit. Average BG is 172, compared with 168 at his last visit and with 163 at the prior visit. His higher BGs occurred after lunch.    6. CGM printout: Skin glucose values and BG values correlate very well. Average SG was 148, compared with 145 at his last visit and with 149 at his prior visit  He was again in auto mode 97% of the time. His highest SGs occurred around 1-2 PM and again from 8  PM-1 AM. His lowest SGs occurred from 5 PM-9 PM.    PAST MEDICAL, FAMILY, AND SOCIAL HISTORY:  Past Medical History:  Diagnosis Date  . ADHD (attention deficit hyperactivity disorder)   . Autonomic neuropathy due to diabetes (HCC)   . Diabetes (HCC)   . Fatigue   . Goiter   . High blood pressure   . High cholesterol   . Hypercholesterolemia   . Hypertension   . Hypoglycemia associated with diabetes (HCC)   . Obesity   . Tachycardia   . Type 1 diabetes mellitus not at goal [facility])   . Uncontrolled DM with microalbuminuria or microproteinuria     Family History  Problem Relation Age of Onset  . Dementia Paternal Aunt   . Schizophrenia Paternal Aunt   . Cancer Maternal Grandmother   . Diabetes Maternal Grandmother        T2 DM  . Cancer Maternal Grandfather   . Thyroid disease Neg Hx      Current Outpatient Prescriptions:  .  AUVI-Q 0.3 MG/0.3ML SOAJ injection, Use as directed for life-threatening allergic reaction., Disp: 4 Device, Rfl: 2 .  budesonide (PULMICORT FLEXHALER) 180 MCG/ACT inhaler, Inhale two doses into the lungs twice daily to prevent cough or wheeze.  Rinse, gargle, and spit after use., Disp: 1 Inhaler, Rfl: 5 .  HUMALOG 100 UNIT/ML injection, INJECT 300 UNITS VIA INSULIN PUMP EVERY 48 TO 72 HOURS PER HYPERGLYCEMIA AND DKA PROTOCOLS., Disp: 40 mL, Rfl: 5 .  losartan (COZAAR) 50 MG tablet, TAKE 1 TABLET(50 MG) BY MOUTH DAILY, Disp: 30 tablet, Rfl: 4 .  montelukast (SINGULAIR) 10 MG tablet, Take 1 tablet (10 mg total) by mouth at bedtime., Disp: 30 tablet, Rfl: 5 .  ONE TOUCH ULTRA TEST test strip, Check sugar 15 x daily, Disp: 450 each, Rfl: 3 .  clonazePAM (KLONOPIN) 0.5 MG tablet, Take 0.5 mg by mouth 2 (two) times daily as needed., Disp: , Rfl: 0 No current facility-administered medications for this visit.   Facility-Administered Medications Ordered in Other Visits:  .  gadopentetate dimeglumine (MAGNEVIST) injection 20 mL, 20 mL, Intravenous, Once PRN,  Dohmeier, Porfirio Mylar, MD  Allergies as of 08/24/2017 - Review Complete 08/24/2017  Allergen Reaction Noted  . Flonase [fluticasone] Shortness Of Breath 06/10/2017    1. Work and Family: He is now working at home at reduced hours. 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  2. Activities: He has not been very physically active for the past several months.   3. Smoking,  alcohol, or drugs: Occasional beer, about one-two per month. No tobacco or drugs. 4. Primary Care Provider: Dr. Guerry Bruin, MD, [facility]  REVIEW OF SYSTEMS: There are no other significant problems involving Deborah's other body systems.   Objective:  Vital Signs:  BP 120/76   Pulse 78   Wt 249 lb 3.2 oz (113 kg)   BMI 35.76 kg/m    Ht Readings from Last 3 Encounters:  07/08/17 5\' 10"  (1.778 m)  06/04/17 6' (1.829 m)  05/28/17 6' (1.829 m)   Wt Readings from Last 3 Encounters:  08/24/17 249 lb 3.2 oz (113 kg)  07/20/17 249 lb 6.4 oz (113.1 kg)  07/08/17 249 lb 3.2 oz (113 kg)   PHYSICAL EXAM:  Constitutional: The patient appears obese, but much better physically. He still coughs frequently.  His weight is unchanged. His affect is pretty normal. His insight is normal.  Face: The face appears normal.  Eyes: There is no obvious arcus or proptosis. Moisture appears normal. The lower eyelids are still moderately swollen, c/w allergic conjunctivitis. .  Mouth: The oropharynx and tongue appear normal. Oral moisture is normal. Neck: The neck appears to be visibly normal. No carotid bruits are noted. He has a short neck. His strap muscles are smaller. The thyroid gland is low lying and still enlarged, but smaller, at about 21 grams in size.The left lobe is mildly tender to palpation today.  Lungs: The lungs are clear to auscultation. Air movement is good.  Heart: Heart rate and rhythm are regular. Heart sounds S1 and S2 are normal. I did not appreciate any pathologic cardiac  murmurs. Abdomen: The abdomen is enlarged. Bowel sounds are normal. There is no obvious hepatomegaly, splenomegaly, or other mass effect.  Arms: Muscle size and bulk are normal for age. Hands: There is no tremor of his hands today. Phalangeal and metacarpophalangeal joints are normal. Palmar muscles are normal. Palmar skin is normal. Palmar moisture is also normal. Legs: Muscles appear normal for age. No edema is present. Feet: 1+ DP pulses. Neurologic: Strength is normal for age in both the upper and lower extremities. Muscle tone is normal. Sensation to touch is normal in both legs and both feet.     LAB DATA:   Labs 08/24/17: CBG 117  Labs 07/20/17: CBG 216  Labs 07/08/17: IgA, IgG, and IgM normal. CBC normal.  Labs 06/16/17: HbA1c 6.5%, CBG 193  Labs 05/15/17: CBG 209; TSH 0.90, free T4 1.2, free T3 3.8  Labs 03/05/17: HbA1c 7.3%, CBG 241  Labs 02/20/17: CBG 180 fasting  Labs 01/29/17: CBG 148  Labs 01/12/17: CBG 116  Labs 12/29/16: HbA1c 9.1%, CBG 223; TSH 0.62, free T4 1.1, free T3 3.5; urine microalbumin/creatinine ratio 22; cholesterol 171, triglycerides 93, HDL 40, LDL 112;  CMP glucose 201  Labs 09/26/16: HbA1c 9.5%  Labs 08/13/16: Celiac panel negative; CMP normal except for glucose 207; CBC normal  Labs 06/13/16: HbA1c 9.8%; LH 3.6, FSH 2.3, testosterone 533 (ref [id number]), free testosterone 59.4 (ref 47-244)  Labs 02/11/16: HbA1c 9.2%  Labs 01/07/16: HbA1c 10.5%; CBC with Hgb elevated at 17.2%; LH 3.9, FSH 2.0; TSH 1.53, free T4 1.3, free T3 3.7; cholesterol 262, triglycerides 171, HDL 40, LDL 188; urinary microalbumin/creatinine ratio 31; CMP normal except for glucose 181  Labs 06/29/15. HbA1c 9.6%  Labs 12/28/14: Hemoglobin A1c 9.5% today, compared with 9.5%, at last visit and with 9.9% at the prior visit.  Labs 08/03/14: HbA1c 9.5%; TSH 0.548,free T4 1.10, free T3 3.7,  TPO antibody < 1, TSI 30; CMP normal except glucose 191; urinary microalbumin/creatinine ratio 27;  cholesterol 260, triglycerides 90, HDL 51, LDL 191   Labs 10/05/13: HbA1c 9.9%  Labs 04/23/13: CMP normal, except glucose 144; cholesterol 216, triglycerides 97, HDL 39, LDL 158; urinary microalbumin/creatinine ratio 6.9; TSH 0.505, free T4 1.17, free T3 3.8  Labs: 01/21/12: TSH 1.251, free T4 1.10, free T3 3.6, CBC normal, CMP normal except for glucose 206, urinary microalbumin/creatinine ratio 6.3, cholesterol 187, triglycerides 67, HDL 45, LDL 129   Assessment and Plan:   ASSESSMENT:  1. T1DM: His BGs are a bit higher due to using both inhaled and nasal steroids. Although he has been ill, the BGs have been much more controlled than they were with similar illnesses before he started on the 670G pump. The combination of the Medtronic 670G pump and Guardian sensor are still helping him achieve better BG control.  2. Hypoglycemia: He has had several low SGs in the evenings recently, often  after site changes.     3. Hypertension: BP is good today. He needs to continue to take meds regularly and to exercise regularly when he feels well enough to do so.  4. Combined hyperlipidemia: His lipids in March 2017 were too high, but the lipids in March 2018 were much better on atorvastatin. He discontinued the atorvastatin due to concern that this medication might be causing his neuromuscular symptoms. We will follow up on this issue later.  5. Autonomic neuropathy with tachycardia and gastroparesis: His heart rate and postprandial bloating have normalized over time with better BG control.  6. Obesity: Weight has been stable in the past month.        7-8. Goiter/thyroiditis:   A. His thyroid gland is unchanged in overall size, but the lobes have shifted in size again. The process of waxing and waning of thyroid gland size is c/w evolving Hashimoto's thyroiditis. His thyroiditis is clinically active on the left lobe today.     B. He was euthyroid in March 2013, but was borderline hyperthyroid in June 2014. He  was euthyroid in October 2015 and again in March 2017.  He was also euthyroid again in March 2018.  9. Fatigue: This problem has improved slightly.  10. Obstructive sleep apnea: He did not meet the criteria for OSA, but because of his snoring a dental appliance was suggested.    11. Adult ADD: He had an appointment with Dr. Piedad Climes, MD in [place] again last month. Dr. Shane Crutch is waiting for [name]'s medical situation to stabilize before beginning any new psych meds.   12. Proliferative diabetic retinopathy: This problem is due to his long period of poorly controled BGs. It will take at least 6-12 months of near-normal BGs for his retinopathy to stabilize.  He will have follow up exam soon.  13. URI/nasal congestion/vertigo/allergic rhinitis and conjunctivitis: He has been sick for several months, but seems to be slowly improving. He will see Dr. Lucie Leather again soon. I suspect that [name] may need allergy shots.  14. Neuromuscular symptoms: Dr. Vickey Huger did not find any signs or any serious neurologic disease. His MRI of his brain was unremarkable. Fortunately, his neuromuscular symptoms are less now.  15. Calcified lymph node: It is possible that he may have contracted a fungus while he was working in the [employer]. Several years ago. His thyroid US showed his goiter, but no intrinsic thyroid abnormality. A partially calcified cervical lymph node on the right was noted, c/w  a post infections/inflammatory node.  PLAN:  1. Diagnostic:  Repeat CBG  today. Call in two weeks on a Sunday or Wednesday evening.  2. Therapeutic: Follow DM care plan. Continue losartan. Continue his current pump basal rates, ISFs, and BG targets. New ICRs:   MN: 7 8 AM: 5 New 11 AM: 5 9 PM: 7 3. Patient education: We discussed the significant improvements in his health since last month and the fact that he needs more insulin to cover lunch and dinner. We also discussed the need for him to continue to be followed  closely by Drs Lucie Leather and Shane Crutch.  4. Follow-up:  1 month  Level of Service: This visit lasted in excess of 55 minutes. More than 50% of the visit was devoted to counseling.  Molli Knock, MD, CDE Adult and Pediatric Endocrinology  08/24/2017 9:16 AM

## 2017-08-25 ENCOUNTER — Ambulatory Visit (INDEPENDENT_AMBULATORY_CARE_PROVIDER_SITE_OTHER): Payer: 59 | Admitting: Allergy and Immunology

## 2017-08-25 ENCOUNTER — Encounter: Payer: Self-pay | Admitting: Allergy and Immunology

## 2017-08-25 VITALS — BP 116/70 | HR 92 | Resp 17

## 2017-08-25 DIAGNOSIS — H6983 Other specified disorders of Eustachian tube, bilateral: Secondary | ICD-10-CM | POA: Diagnosis not present

## 2017-08-25 DIAGNOSIS — Z91018 Allergy to other foods: Secondary | ICD-10-CM | POA: Diagnosis not present

## 2017-08-25 DIAGNOSIS — B370 Candidal stomatitis: Secondary | ICD-10-CM

## 2017-08-25 DIAGNOSIS — J4540 Moderate persistent asthma, uncomplicated: Secondary | ICD-10-CM | POA: Diagnosis not present

## 2017-08-25 DIAGNOSIS — J3089 Other allergic rhinitis: Secondary | ICD-10-CM

## 2017-08-25 MED ORDER — FLUCONAZOLE 150 MG PO TABS: 150.0000 mg | ORAL_TABLET | Freq: Once | ORAL | 0 refills | Status: AC

## 2017-08-25 MED ORDER — ALBUTEROL SULFATE HFA 108 (90 BASE) MCG/ACT IN AERS: 2.0000 | INHALATION_SPRAY | Freq: Four times a day (QID) | RESPIRATORY_TRACT | 2 refills | Status: DC | PRN

## 2017-08-25 MED ORDER — BUDESONIDE-FORMOTEROL FUMARATE 160-4.5 MCG/ACT IN AERO: 2.0000 | INHALATION_SPRAY | Freq: Two times a day (BID) | RESPIRATORY_TRACT | 5 refills | Status: DC

## 2017-08-25 MED ORDER — RANITIDINE HCL 150 MG PO TABS: 300.0000 mg | ORAL_TABLET | Freq: Every day | ORAL | 5 refills | Status: DC

## 2017-08-25 NOTE — Progress Notes (Signed)
Follow-up Note  Referring Provider: Gaspar Garbe, MD Primary Provider: Gaspar Garbe, MD Date of Office Visit: 08/25/2017  Subjective:   [name] (DOB: 1981/05/26) is a 36 y.o. male who [facility] in re-evaluation of the following:  HPI: [name] returns to this clinic in reevaluation of his recurrent respiratory tract inflammatory flares and infections. I last saw him in this clinic 08/05/2017 at which point in time he appeared to have a component of infectious rhinitis and also appeared to have a issue tied up with reflux disease for which we gave him topical Bactroban for his nose and started him on ranitidine.  He subsequently ended up at his doctor's office and was treated with ceftriaxone, azithromycin and some other medications for what appeared to be a persistent upper airway issue. He is somewhat better at this point in time yet still continues to have some congestion of his upper airway. He did visit with an ENT doctor in [place] who examined him and looked at his x-rays and did not really make a significant change or suggestions regarding his medical plan.  He does not feel short of breath very often but has coughing in the morning.  As well, he has had some intermittent indigestion and had some regurgitation especially recently.  In addition, he has "crackling" in his ears especially when he moves his jaw but does not have a decrease in his hearing or any vertigo.  He does not consume shellfish, specifically muscles.  Allergies as of 08/25/2017      Reactions   Flonase [fluticasone] Shortness Of Breath      Medication List      albuterol 108 (90 Base) MCG/ACT inhaler Commonly known as:  PROAIR HFA Inhale 2 puffs into the lungs every 6 (six) hours as needed for wheezing or shortness of breath.   AUVI-Q 0.3 mg/0.3 mL Soaj injection Generic drug:  EPINEPHrine Use as directed for life-threatening  allergic reaction.   budesonide-formoterol 160-4.5 MCG/ACT inhaler Commonly known as:  SYMBICORT Inhale 2 puffs into the lungs 2 (two) times daily.   clonazePAM 0.5 MG tablet Commonly known as:  KLONOPIN Take 0.5 mg by mouth 2 (two) times daily as needed.   fluconazole 150 MG tablet Commonly known as:  DIFLUCAN Take 1 tablet (150 mg total) by mouth once. Then repeat dose in one week.   HUMALOG 100 UNIT/ML injection Generic drug:  insulin lispro INJECT 300 UNITS VIA INSULIN PUMP EVERY 48 TO 72 HOURS PER HYPERGLYCEMIA AND DKA PROTOCOLS.   losartan 50 MG tablet Commonly known as:  COZAAR TAKE 1 TABLET(50 MG) BY MOUTH DAILY   montelukast 10 MG tablet Commonly known as:  SINGULAIR Take 1 tablet (10 mg total) by mouth at bedtime.   ONE TOUCH ULTRA TEST test strip Generic drug:  glucose blood Check sugar 15 x daily   ranitidine 150 MG tablet Commonly known as:  ZANTAC Take 2 tablets (300 mg total) by mouth daily.       Past Medical History:  Diagnosis Date  . ADHD (attention deficit hyperactivity disorder)   . Autonomic neuropathy due to diabetes (HCC)   . Diabetes (HCC)   . Fatigue   . Goiter   . High blood pressure   . High cholesterol   . Hypercholesterolemia   . Hypertension   . Hypoglycemia associated with diabetes (HCC)   . Obesity   . Tachycardia   . Type 1 diabetes  mellitus not at goal [facility])   . Uncontrolled DM with microalbuminuria or microproteinuria     Past Surgical History:  Procedure Laterality Date  . none    . WISDOM TOOTH EXTRACTION      Review of systems negative except as noted in HPI / PMHx or noted below:  Review of Systems  Constitutional: Negative.   HENT: Negative.   Eyes: Negative.   Respiratory: Negative.   Cardiovascular: Negative.   Gastrointestinal: Negative.   Genitourinary: Negative.   Musculoskeletal: Negative.   Skin: Negative.   Neurological: Negative.   Endo/Heme/Allergies: Negative.   Psychiatric/Behavioral:  Negative.      Objective:   Vitals:   08/25/17 0955  BP: 116/70  Pulse: 92  Resp: 17  SpO2: 98%          Physical Exam  Constitutional: He is well-developed, well-nourished, and in no distress.  HENT:  Head: Normocephalic.  Right Ear: Tympanic membrane, external ear and ear canal normal.  Left Ear: Tympanic membrane, external ear and ear canal normal.  Nose: Nose normal. No mucosal edema or rhinorrhea.  Mouth/Throat: Uvula is midline and mucous membranes are normal. Oropharyngeal exudate (thrush) present.  Eyes: Conjunctivae are normal.  Neck: Trachea normal. No tracheal tenderness present. No tracheal deviation present. No thyromegaly present.  Cardiovascular: Normal rate, regular rhythm, S1 normal, S2 normal and normal heart sounds.   No murmur heard. Pulmonary/Chest: Breath sounds normal. No stridor. No respiratory distress. He has no wheezes. He has no rales.  Musculoskeletal: He exhibits no edema.  Lymphadenopathy:       Head (right side): No tonsillar adenopathy present.       Head (left side): No tonsillar adenopathy present.    He has no cervical adenopathy.  Neurological: He is alert. Gait normal.  Skin: No rash noted. He is not diaphoretic. No erythema. Nails show no clubbing.  Psychiatric: Mood and affect normal.    Diagnostics:    Spirometry was performed and demonstrated an FEV1 of 3.01 at 69 % of predicted.   Assessment and Plan:   1. Not well controlled moderate persistent asthma   2. Other allergic rhinitis   3. ETD (Eustachian tube dysfunction), bilateral   4. Food allergy   5. Thrush     1. Continue to perform Allergen avoidance measures as best as possible  2. Treat and prevent inflammation:   A. Restart OTC RHINOCORT one spray each nostril three times per week  B. montelukast 10 mg tablet once a day  C. Symbicort 160 - 2 inhalations 2 times per day with spacer  3. Treat reflux:   A. OTC ranitidine 150 two tablets one time per day  4.  Treat fungal infection:   A. Diflucan 150 mg tablet today and repeat in one week  4. If needed:   A. nasal saline  B. OTC antihistamine  C. Auvi-Q 0.3  D. Proair HFA 2 inhalations every 4-6 hours  5. Consider a course of immunotherapy  6. Return to clinic in Christmas 2018 or earlier if problem  7. Obtain fall flu vaccine  [name] still appears to have some inflammation of his respiratory tract and I will have him restart a low dose of nasal steroid as he did get into some problems with nasal irritation when using full dose nasal steroid in the past. As well, I will increase his therapy for lower airway inflammation with the use of Symbicort as noted above. He will use ranitidine as noted above to treat  what appears to be a component of reflux and I have given him Diflucan for the next 2 weeks to address thrush which is probably a reflection of the administration of ceftriaxone and azithromycin. I think that most of his issue that occurred over the course of the past few weeks was probably tied up with some type of viral respiratory tract infection that may have been associated with some bacterial overgrowth in his airway. He seems better and hopefully with the plan noted above everything will clear out and we will see what happens over the course the next 8 weeks. He will contact me during the interval should there be a problem.  Laurette SchimkeEric Kozlow, MD Allergy / Immunology [facility]

## 2017-08-25 NOTE — Patient Instructions (Addendum)
  1. Continue to perform Allergen avoidance measures as best as possible  2. Treat and prevent inflammation:   A. Restart OTC RHINOCORT one spray each nostril three times per week  B. montelukast 10 mg tablet once a day  C. Symbicort 160 - 2 inhalations 2 times per day with spacer  3. Treat reflux:   A. OTC ranitidine 150 two tablets one time per day  4. Treat fungal infection:   A. Diflucan 150 mg tablet today and repeat in one week  4. If needed:   A. nasal saline  B. OTC antihistamine  C. Auvi-Q 0.3  D. Proair HFA 2 inhalations every 4-6 hours  5. Consider a course of immunotherapy  6. Return to clinic in Christmas 2018 or earlier if problem  7. Obtain fall flu vaccine

## 2017-08-27 ENCOUNTER — Telehealth: Payer: Self-pay | Admitting: *Deleted

## 2017-08-27 NOTE — Telephone Encounter (Signed)
Please inform patient that this may be a side effect of medications. Decrease ranitidine to 150mg  per day, hold off on second dose of diflucan. See if this helps over next 24 hours.

## 2017-08-27 NOTE — Telephone Encounter (Signed)
Pt calling he is experiencing sob, dizziness, foggy, and nauseous . It started after his apt yesterday. He is taking all medications including diflucan. Please advise.

## 2017-08-27 NOTE — Telephone Encounter (Signed)
Patient has been informed and advised. He will call back with an update.

## 2017-08-31 ENCOUNTER — Encounter: Payer: Self-pay | Admitting: Neurology

## 2017-08-31 ENCOUNTER — Ambulatory Visit: Payer: 59 | Admitting: Neurology

## 2017-08-31 VITALS — BP 124/80 | HR 95 | Ht 72.0 in | Wt 253.0 lb

## 2017-08-31 DIAGNOSIS — E669 Obesity, unspecified: Secondary | ICD-10-CM

## 2017-08-31 DIAGNOSIS — M542 Cervicalgia: Secondary | ICD-10-CM | POA: Insufficient documentation

## 2017-08-31 DIAGNOSIS — R0683 Snoring: Secondary | ICD-10-CM

## 2017-08-31 DIAGNOSIS — E1043 Type 1 diabetes mellitus with diabetic autonomic (poly)neuropathy: Secondary | ICD-10-CM | POA: Diagnosis not present

## 2017-08-31 DIAGNOSIS — R251 Tremor, unspecified: Secondary | ICD-10-CM

## 2017-08-31 DIAGNOSIS — E108 Type 1 diabetes mellitus with unspecified complications: Secondary | ICD-10-CM | POA: Insufficient documentation

## 2017-08-31 NOTE — Progress Notes (Signed)
SLEEP MEDICINE CLINIC   Provider:  Melvyn Novas, M D  Primary Care Physician:  Gaspar Garbe, MD   Referring Provider: [facility] sports medicine ,    Chief Complaint  Patient presents with  . Follow-up    pt following up today. pt with  mother, rm 51. still having some shakiness in bilateral arms.    HPI:  08-31-2017, [name] is seen here today for a follow-up visit after his sleep study from 06/18/2017.  The patient again had such a mild degree of apnea that it does not need to be  treated with any intervention.  His AHI was only 2.6/h not considered pathologically.  His REM sleep AHI was 14.3/h and supine AHI 3.4.  We meet today to also look at his sleep experience since the study.  The patient reports that he still wakes up with trembling in both arms, he has shoulder discomfort which keeps him from sleeping on his side.  However in intervention with CPAP, PLM medication or oxygen is not necessary at this point.  I do not have a good understanding as to why he trembles, and why these trembling spells wake him from sleep.  He has been followed closely for his thyroid function and reports that it has been in the high normal range.  He still reports some palpitations that he experiences at night.  He also reports facial twitching at night. He related the onset to psychiatric medication ( wellbutrin, trazodone). He has year around asthma and allergies.  I have no interventional therapies available.     [name] is a 36 y.o. male , seen here as in a referral/ revisit  from sports medicine and Dr Fransico [name], ped endocrinologist.  I would like to thank Dr. Fransico [name] for his report notes which helped me today to design further sleep evaluation and treatment for this patient. He also gave me an excellent summary of the past medical problems that [name] has faced. [name] was diagnosed with diabetes at age 25 and has been followed by a pediatric endocrinologist, Dr.  Fransico [name]. His blood sugars have been better and more stable on an auto mode Medtronic insulin pump. He also faces problems with obesity, hypertension, hyperlipidemia and an autonomic neuropathy with tachycardia and gastroparesis. As blood pressure control had improved so did his heart rate and his postprandial bloating. He has been treated for thyroiditis consistent with an evolving Hashimoto's ultra immune thyroid disease. He was euthyroid again in March 2018 when last checked he had reported fatigue and was diagnosed by a home sleep test last year at " [facility]", was  supposedly diagnosed with obstructive sleep apnea but had not treatment initiated .  He has proliferative retinopathy, attributed to diabetes. Dr. Fransico [name] also wanted to make sure that current rather newish neuromuscular symptoms beI evaluated -he was especially concerned that this patient is prone to further auto-immune diseases such as central demyelination or multiple sclerosis. Dr. Darrick Penna in this sport medicine section of [facility] [place] had added in this visit on 04/15/2007 but the patient habits and improvement of nausea, vertigo, after discontinuing eyedrops, trazodone and Wellbutrin and atorvastatin. He had more headaches. He felt jittery and tremors of his right hand and right leg were temporary worse. He describes photophobia. He felt shaking more in the morning l right hand and right leg, an inner jitteriness. It has improved. Vertigo not present today.    Sleep habits are as follows:  " Bedtime between 10  and 12 midnight, rises at 7 AM. He describes his sleep is rather restless, trying to find a comfortable sleep position, but she doesn't necessarily use the bed during the night. He does not report nocturia, and only twice or so in the last month has he woke up with palpitations or clamminess, likely related to hypoglycemia. He is known to snore, and he [facility] and was told  that he has obstructive sleep apnea. Treatment was not initiated yet. He wakes up not feeling restored or refreshed, and he has noticed that his jittery illness affecting the right extremities and a feeling of inner trembling of his torso is the worst in the morning hours. It doesn't last that long. He also has a pulsating change of vision when he first gets up and he suffered from vertigo for much of the last 3 months. No daytime naps, he wouldn't sleep at night if he did.    Sleep medical history and family sleep history:  Dad snoring, mom is an insomniac. He remembers having problems sleeping even in childhood. He has felt fatigued and not restored for several months.   Social history: works as Games developerconstruction supervisor. The patient is single, no children. No tobacco use, seldom alcohol use,  4 months ago he stopped drinking caffeine due to the jitteriness, and headaches with photophobia and a feeling of the head being full could also be relating to sinusitis.    Review of Systems: Out of a complete 14 system review, the patient complains of only the following symptoms, and all other reviewed systems are negative.  Patient has shoulder pain and trembling in both arms, feels tight, achy. Not shooting pains, not electric sensation. Soreness   Epworth score 5, Fatigue severity score 30 , depression score n/a    Social History   Socioeconomic History  . Marital status: Single    Spouse name: Not on file  . Number of children: Not on file  . Years of education: Not on file  . Highest education level: Not on file  Social Needs  . Financial resource strain: Not on file  . Food insecurity - worry: Not on file  . Food insecurity - inability: Not on file  . Transportation needs - medical: Not on file  . Transportation needs - non-medical: Not on file  Occupational History  . Not on file  Tobacco Use  . Smoking status: Never Smoker  . Smokeless tobacco: Never Used  Substance and Sexual  Activity  . Alcohol use: No  . Drug use: No  . Sexual activity: Not on file  Other Topics Concern  . Not on file  Social History Narrative   ** Merged History Encounter **        Family History  Problem Relation Age of Onset  . Dementia Paternal Aunt   . Schizophrenia Paternal Aunt   . Cancer Maternal Grandmother   . Diabetes Maternal Grandmother        T2 DM  . Cancer Maternal Grandfather   . Thyroid disease Neg Hx     Past Medical History:  Diagnosis Date  . ADHD (attention deficit hyperactivity disorder)   . Autonomic neuropathy due to diabetes (HCC)   . Diabetes (HCC)   . Fatigue   . Goiter   . High blood pressure   . High cholesterol   . Hypercholesterolemia   . Hypertension   . Hypoglycemia associated with diabetes (HCC)   .  Obesity   . Tachycardia   . Type 1 diabetes mellitus not at goal [facility])   . Uncontrolled DM with microalbuminuria or microproteinuria     Past Surgical History:  Procedure Laterality Date  . none    . WISDOM TOOTH EXTRACTION      Current Outpatient Medications  Medication Sig Dispense Refill  . albuterol (PROAIR HFA) 108 (90 Base) MCG/ACT inhaler Inhale 2 puffs into the lungs every 6 (six) hours as needed for wheezing or shortness of breath. 1 Inhaler 2  . AUVI-Q 0.3 MG/0.3ML SOAJ injection Use as directed for life-threatening allergic reaction. 4 Device 2  . budesonide-formoterol (SYMBICORT) 160-4.5 MCG/ACT inhaler Inhale 2 puffs into the lungs 2 (two) times daily. 1 Inhaler 5  . clonazePAM (KLONOPIN) 0.5 MG tablet Take 0.5 mg by mouth 2 (two) times daily as needed.  0  . HUMALOG 100 UNIT/ML injection INJECT 300 UNITS VIA INSULIN PUMP EVERY 48 TO 72 HOURS PER HYPERGLYCEMIA AND DKA PROTOCOLS. 40 mL 5  . losartan (COZAAR) 50 MG tablet TAKE 1 TABLET(50 MG) BY MOUTH DAILY 30 tablet 4  . montelukast (SINGULAIR) 10 MG tablet Take 1 tablet (10 mg total) by mouth at bedtime. 30 tablet 5  . ONE TOUCH ULTRA TEST test strip Check sugar 15 x daily  450 each 3  . ranitidine (ZANTAC) 150 MG tablet Take 2 tablets (300 mg total) by mouth daily. 60 tablet 5   No current facility-administered medications for this visit.    Facility-Administered Medications Ordered in Other Visits  Medication Dose Route Frequency Provider Last Rate Last Dose  . gadopentetate dimeglumine (MAGNEVIST) injection 20 mL  20 mL Intravenous Once PRN Noel Henandez, Porfirio Mylar, MD        Allergies as of 08/31/2017 - Review Complete 08/31/2017  Allergen Reaction Noted  . Flonase [fluticasone] Shortness Of Breath 06/10/2017    Vitals: BP 124/80   Pulse 95   Ht 6' (1.829 m)   Wt 253 lb (114.8 kg)   BMI 34.31 kg/m  Last Weight:  Wt Readings from Last 1 Encounters:  08/31/17 253 lb (114.8 kg)   KGM:WNUU mass index is 34.31 kg/m.     Last Height:   Ht Readings from Last 1 Encounters:  08/31/17 6' (1.829 m)    Physical exam:  General: The patient is awake, alert and appears not in acute distress. The patient is well groomed. Head: Normocephalic, atraumatic. Neck is supple. Mallampati 5-  ,  neck circumference:21. Nasal airflow patent , TMJ is  Not  evident . Retrognathia is not seen. Full facial hair.  Cardiovascular:  Regular rate and rhythm , without  murmurs or carotid bruit, and without distended neck veins. Respiratory: Lungs are clear to auscultation. Skin:  Without evidence of edema, or rash Trunk: BMI is elevated  The patient's posture is erect   Neurologic exam : The patient is awake and alert, oriented to place and time.   Memory subjective described as intact.  Attention span & concentration ability appears normal.  Speech is fluent,  without dysarthria, dysphonia or aphasia.  Mood and affect are appropriate.  Cranial nerves: Pupils are equal and briskly reactive to light. Extraocular movements  in vertical and horizontal planes intact and without nystagmus. Visual fields by finger perimetry are intact. Hearing to finger rub intact.  Facial  sensation intact to fine touch. Facial motor strength is symmetric and tongue and uvula move midline. Shoulder shrug was symmetrical.   Motor exam:  Normal tone, muscle bulk and  symmetric strength in all extremities. Sensory:  Fine touch, pinprick and vibration were tested in all extremities. Proprioception tested in the upper extremities was normal. Coordination: Rapid alternating movements in the fingers/hands was normal. Finger-to-nose maneuver  normal without evidence of ataxia, dysmetria or tremor. NO TREMOR HERE> Gait and station: Patient walks without assistive device and is able unassisted to climb up to the exam table. Strength within normal limits. Stance is stable and normal.   Deep tendon reflexes: in the upper and lower extremities are symmetric and intact. Babinski maneuver response is  downgoing.  Assessment:  After physical and neurologic examination, review of laboratory studies,  Personal review of imaging studies, reports of other /same  Imaging studies, results of polysomnography and / or neurophysiology testing and pre-existing records as far as provided in visit., my assessment is   1) as to the patient's feeling of jitteriness and right sided tremors, these have much improved over the last month or so. He describes some worsen the morning and I do wonder if there is a component of hypoglycemia or an allergic component. He felt more restless and jittery when sleeping on his side- which has helped his snoring.   2) MRI brain was normal as was c-spine X ray. As a young onset diabetic with multiple sequelae of diabetic neuropathy, autonomic neuropathy, retinopathy he could be prone to cerebrovascular changes as well.  3) his sleep evaluation has been completed, no apnea, mild PLMs and no hypoxemia. Snoring was mild- moderate.   The patient was advised of the nature of the diagnosed disorder , the treatment options and the  risks for general health and wellness arising from not  treating the condition.   I spent more than 30 minutes of face to face time with the patient.  Greater than 50% of time was spent in counseling and coordination of care. We have discussed the diagnosis and differential and I answered the patient's questions.    Plan:  Treatment plan and additional workup :  I have nothing to offer in terms of improving his sleep- except weight loss. Needs to work with endocrinology and ADHD.  He asked of a local behavior health provider and I recommended Ellis Savage, NP.    Melvyn Novas, MD 08/31/2017, 9:44 AM  Certified in Neurology by ABPN Certified in Sleep Medicine by [facility] [address], [address] [place], [place] [address]

## 2017-08-31 NOTE — Patient Instructions (Signed)
Your physician has ordered a Nerve Conduction Study (NCV) and/or EMG testing.  This is a test to assess the status of your nerves and muscles. For the NCV portion of the test , sticky tabs will be placed on either your hands or feet.  Your nerves will be stimulated using small electrical charges and the speed of the impulse will be measured as it travels down the nerve. For the EMG portion of the test, a small pin will be placed below the surface of the skin to measure the electrical activity in certain muscles in your arms or legs. Eating/drinking prior to testing is ok.  Please DO NOT discontinue ANY medications prior to testing  Your appointment will be scheduled by a member of our team.  You will need to check in with the main reception desk. Your test could take approximately 30 minutes to 1 hour to complete depending on the extent of the test that has been ordered. TESTING ON LEGS/BACK/HIP/FOOT AREA: Bring/wear a pair of shorts.  **ABSOLUTELY NO LOTIONS, MOISTURIZERS, VASELINE, OILS OR CREAMS OF ANY KIND ON ANY BODY PART THE DAY OF THE TEST**  **DEODORANT IS OK TO WEAR**  Please make every effort to keep your scheduled appointment time, as your physician needs the test results prior to your next appointment.  If you have to cancel or reschedule, please do so as soon as possible, so that another patient may use that testing time slot.  If you cancel your appointment, you may also need to reschedule your referring doctor's appointment until the test and results can be completed.  Please call [phone] and ask for Dr. Newton's assistant if you need to do so.  If you have any questions at any time please let us know.  We look forward to working with you!! 

## 2017-09-04 ENCOUNTER — Other Ambulatory Visit: Payer: Self-pay | Admitting: "Endocrinology

## 2017-09-07 ENCOUNTER — Ambulatory Visit: Payer: 59 | Admitting: Neurology

## 2017-09-07 ENCOUNTER — Encounter: Payer: Self-pay | Admitting: Neurology

## 2017-09-07 ENCOUNTER — Ambulatory Visit (INDEPENDENT_AMBULATORY_CARE_PROVIDER_SITE_OTHER): Payer: 59 | Admitting: Neurology

## 2017-09-07 DIAGNOSIS — R5383 Other fatigue: Secondary | ICD-10-CM

## 2017-09-07 DIAGNOSIS — E1043 Type 1 diabetes mellitus with diabetic autonomic (poly)neuropathy: Secondary | ICD-10-CM

## 2017-09-07 DIAGNOSIS — M542 Cervicalgia: Secondary | ICD-10-CM | POA: Diagnosis not present

## 2017-09-07 DIAGNOSIS — E669 Obesity, unspecified: Secondary | ICD-10-CM

## 2017-09-07 DIAGNOSIS — R251 Tremor, unspecified: Secondary | ICD-10-CM

## 2017-09-07 NOTE — Progress Notes (Signed)
Please refer to EMG and nerve conduction study procedure note. 

## 2017-09-07 NOTE — Progress Notes (Signed)
    [facility]    Nerve / Sites Muscle Latency Ref. Amplitude Ref. Rel Amp Segments Distance Velocity Ref. Area    ms ms mV mV %  cm m/s m/s mVms  L Median - APB     Wrist APB 3.8 ?4.4 13.3 ?4.0 100 Wrist - APB 7   51.2     Upper arm APB 8.1  12.9  96.8 Upper arm - Wrist 24 56 ?49 48.7  R Median - APB     Wrist APB 3.5 ?4.4 14.9 ?4.0 100 Wrist - APB 7   59.5     Upper arm APB 8.1  14.5  97.1 Upper arm - Wrist 25 55 ?49 57.0  L Ulnar - ADM     Wrist ADM 3.1 ?3.3 7.3 ?6.0 100 Wrist - ADM 7   31.1     B.Elbow ADM 7.2  7.4  101 B.Elbow - Wrist 21 50 ?49 30.6     A.Elbow ADM 9.0  7.6  103 A.Elbow - B.Elbow 10 56 ?49 31.1         A.Elbow - Wrist      R Ulnar - ADM     Wrist ADM 3.2 ?3.3 10.0 ?6.0 100 Wrist - ADM 7   37.8     B.Elbow ADM 7.2  8.8  87.8 B.Elbow - Wrist 21 52 ?49 32.9     A.Elbow ADM 9.1  9.4  108 A.Elbow - B.Elbow 10 53 ?49 36.8         A.Elbow - Wrist                 [facility]    Nerve / Sites Rec. Site Peak Lat Amp Segments Distance    ms V  cm  L Median - Orthodromic (Dig II, Mid palm)     Dig II Wrist 3.7 28 Dig II - Wrist 13  R Median - Orthodromic (Dig II, Mid palm)     Dig II Wrist 3.7 30 Dig II - Wrist 13  L Ulnar - Orthodromic, (Dig V, Mid palm)     Dig V Wrist 3.2 28 Dig V - Wrist 11  R Ulnar - Orthodromic, (Dig V, Mid palm)     Dig V Wrist 3.0 15 Dig V - Wrist 4711             F  Wave    Nerve F Lat Ref.   ms ms  L Median - APB 28.2 ?31.0  L Ulnar - ADM 30.8 ?32.0  R Median - APB 29.2 ?31.0  R Ulnar - ADM 31.3 ?32.0             EMG full       EMG Summary Table    Spontaneous MUAP Recruitment  Muscle IA Fib PSW Fasc Other Amp Dur. Poly Pattern  R. Abductor digiti minimi (manus) Normal None None None _______ Normal Normal Normal Normal

## 2017-09-07 NOTE — Procedures (Signed)
     HISTORY:  Joseph ProntoGabriel [name] is a 36 year old gentleman with a history of diabetes who reports a 5712-month history of some mild discomfort in the neck and shoulders and a feeling of fatigue and some tremor involving the hands, right greater than left.  The patient denies any actual numbness or true weakness of the extremities.  He is being evaluated for the above intermittent symptoms.  NERVE CONDUCTION STUDIES:  Nerve conduction studies were performed on both upper extremities. The distal motor latencies and motor amplitudes for the median and ulnar nerves were within normal limits. The F wave latencies and nerve conduction velocities for these nerves were also normal. The sensory latencies for the median and ulnar nerves were normal.   EMG STUDIES:  EMG study was performed on the right upper extremity:  The first dorsal interosseous muscle reveals 2 to 4 K units with full recruitment. No fibrillations or positive waves were noted. The abductor pollicis brevis muscle reveals 2 to 4 K units with full recruitment. No fibrillations or positive waves were noted. The extensor indicis proprius muscle reveals 1 to 3 K units with full recruitment. No fibrillations or positive waves were noted. The pronator teres muscle reveals 2 to 3 K units with full recruitment. No fibrillations or positive waves were noted. The biceps muscle reveals 1 to 2 K units with full recruitment. No fibrillations or positive waves were noted. The triceps muscle reveals 2 to 4 K units with full recruitment. No fibrillations or positive waves were noted. The anterior deltoid muscle reveals 2 to 3 K units with full recruitment. No fibrillations or positive waves were noted. The cervical paraspinal muscles were tested at 2 levels. No abnormalities of insertional activity were seen at either level tested. There was good relaxation.   IMPRESSION:  Nerve conduction studies done on both upper extremities were within normal  limits.  There is no evidence of a neuropathy is seen.  EMG evaluation of the right upper extremity is normal without evidence of an overlying cervical radiculopathy.  [name]. Joseph Tashanti Dalporto MD 09/07/2017 10:12 AM  [facility] [address] [place], [place] [id number]  Phone [phone] Fax [phone]

## 2017-09-10 ENCOUNTER — Telehealth: Payer: Self-pay | Admitting: Neurology

## 2017-09-10 NOTE — Telephone Encounter (Signed)
-----   Message from Melvyn Novasarmen Dohmeier, MD sent at 09/09/2017  8:36 AM EST ----- Normal EMG and NCV - no neuropathy found, no radiculopathy found.

## 2017-09-10 NOTE — Telephone Encounter (Signed)
Called the patient to make him aware that the NCV and EMG were normal. No answer. LVM with this information and instructed the pt to call back if he has any other questions.

## 2017-10-06 ENCOUNTER — Encounter: Payer: Self-pay | Admitting: Allergy and Immunology

## 2017-10-06 ENCOUNTER — Ambulatory Visit: Payer: 59 | Admitting: Allergy and Immunology

## 2017-10-06 VITALS — BP 112/74 | HR 102 | Temp 98.1°F | Resp 16

## 2017-10-06 DIAGNOSIS — J4540 Moderate persistent asthma, uncomplicated: Secondary | ICD-10-CM

## 2017-10-06 DIAGNOSIS — H6983 Other specified disorders of Eustachian tube, bilateral: Secondary | ICD-10-CM

## 2017-10-06 DIAGNOSIS — J3089 Other allergic rhinitis: Secondary | ICD-10-CM

## 2017-10-06 DIAGNOSIS — Z91018 Allergy to other foods: Secondary | ICD-10-CM

## 2017-10-06 DIAGNOSIS — K219 Gastro-esophageal reflux disease without esophagitis: Secondary | ICD-10-CM

## 2017-10-06 MED ORDER — BUDESONIDE 0.5 MG/2ML IN SUSP
0.5000 mg | RESPIRATORY_TRACT | 5 refills | Status: DC
Start: 2017-10-06 — End: 2017-10-13

## 2017-10-06 MED ORDER — FLUTICASONE FUROATE 200 MCG/ACT IN AEPB: 1.0000 | INHALATION_SPRAY | Freq: Every day | RESPIRATORY_TRACT | 5 refills | Status: DC

## 2017-10-06 NOTE — Patient Instructions (Addendum)
  1. Continue to perform Allergen avoidance measures as best as possible  2. Start a course of immunotherapy  3. Treat and prevent inflammation:   A. Arnuity 200 - one inhalation one time per day. Sample. Coupon.   B. montelukast 10 mg tablet once a day  C. Budesonide 0.5 mg in 50-100 ml nasal saline few times per week  4. Treat reflux:   A. OTC ranitidine 150 two tablets one time per day  5. If needed:   A. nasal saline / wash  B. OTC antihistamine  C. Auvi-Q 0.3  D. Proair HFA 2 inhalations every 4-6 hours  E. ibuprofen  6. Return to clinic in February 2019 or earlier if problem  7. Obtain fall flu vaccine when feeling better

## 2017-10-06 NOTE — Progress Notes (Signed)
Follow-up Note  Referring Provider: Gaspar Garbeisovec, Richard W, MD Primary Provider: Gaspar Garbeisovec, Richard W, MD Date of Office Visit: 10/06/2017  Subjective:   [name] (DOB: 10/03/1981) is a 36 y.o. male [facility] in re-evaluation of the following:  HPI: [name] returns to this clinic in reevaluation of his asthma and allergic rhinitis. He was last seen in this clinic 25 August 2017.  He was doing very well until about 1 week ago at which point in time he developed sore throat and head congestion and dyspnea on exertion without any wheezing and coughing or chest pain and also had ear pressure.  He has not had any fever or anosmia or ugly nasal discharge or significant headache.  He is intolerant of using most of the medications that I have prescribed over the course of the past several months in an attempt to prevent him from developing significant inflammation of his respiratory tract.  He developed chest pain when using Symbicort.  He developed CNS side effects when using Diflucan in the treatment of thrush.  He does not use his ranitidine for his reflux but relies on the use of over-the-counter Tums.  He gets bleeding in his nose if he uses a nasal steroid such as Rhinocort or fluticasone even in the face of using this medication only twice a week.  He remains away from eating shellfish.  Allergies as of 10/06/2017      Reactions   Flonase [fluticasone] Shortness Of Breath      Medication List      albuterol 108 (90 Base) MCG/ACT inhaler Commonly known as:  PROAIR HFA Inhale 2 puffs into the lungs every 6 (six) hours as needed for wheezing or shortness of breath.   AUVI-Q 0.3 mg/0.3 mL Soaj injection Generic drug:  EPINEPHrine Use as directed for life-threatening allergic reaction.   clonazePAM 0.5 MG tablet Commonly known as:  KLONOPIN Take 0.5 mg by mouth 2 (two) times daily as needed.   HUMALOG 100 UNIT/ML  injection Generic drug:  insulin lispro INJECT 300 UNITS VIA INSULIN PUMP EVERY 48 TO 72 HOURS PER HYPERGLYCEMIA AND DKA PROTOCOLS.   losartan 50 MG tablet Commonly known as:  COZAAR TAKE 1 TABLET(50 MG) BY MOUTH DAILY   montelukast 10 MG tablet Commonly known as:  SINGULAIR Take 1 tablet (10 mg total) by mouth at bedtime.   ONE TOUCH ULTRA TEST test strip Generic drug:  glucose blood Check sugar 15 x daily   ONE TOUCH ULTRA TEST test strip Generic drug:  glucose blood USE AS DIRECTED SIX TIMES DAILY   ranitidine 150 MG tablet Commonly known as:  ZANTAC Take 2 tablets (300 mg total) by mouth daily.       Past Medical History:  Diagnosis Date  . ADHD (attention deficit hyperactivity disorder)   . Autonomic neuropathy due to diabetes (HCC)   . Diabetes (HCC)   . Fatigue   . Goiter   . High blood pressure   . High cholesterol   . Hypercholesterolemia   . Hypertension   . Hypoglycemia associated with diabetes (HCC)   . Obesity   . Tachycardia   . Type 1 diabetes mellitus not at goal [facility](HCC)   . Uncontrolled DM with microalbuminuria or microproteinuria     Past Surgical History:  Procedure Laterality Date  . none    . WISDOM TOOTH EXTRACTION      Review of systems negative except as noted  in HPI / PMHx or noted below:  Review of Systems  Constitutional: Negative.   HENT: Negative.   Eyes: Negative.   Respiratory: Negative.   Cardiovascular: Negative.   Gastrointestinal: Negative.   Genitourinary: Negative.   Musculoskeletal: Negative.   Skin: Negative.   Neurological: Negative.   Endo/Heme/Allergies: Negative.   Psychiatric/Behavioral: Negative.      Objective:   Vitals:   10/06/17 1301  BP: 112/74  Pulse: (!) 102  Resp: 16  Temp: 98.1 F (36.7 C)  SpO2: 97%          Physical Exam  Constitutional: He is well-developed, well-nourished, and in no distress.  HENT:  Head: Normocephalic.  Right Ear: Tympanic membrane, external ear and ear  canal normal.  Left Ear: Tympanic membrane, external ear and ear canal normal.  Nose: Mucosal edema (Erythematous) present. No rhinorrhea.  Mouth/Throat: Uvula is midline, oropharynx is clear and moist and mucous membranes are normal. No oropharyngeal exudate.  Eyes: Conjunctivae are normal.  Neck: Trachea normal. No tracheal tenderness present. No tracheal deviation present. No thyromegaly present.  Cardiovascular: Normal rate, regular rhythm, S1 normal, S2 normal and normal heart sounds.  No murmur heard. Pulmonary/Chest: Breath sounds normal. No stridor. No respiratory distress. He has no wheezes. He has no rales.  Musculoskeletal: He exhibits no edema.  Lymphadenopathy:       Head (right side): No tonsillar adenopathy present.       Head (left side): No tonsillar adenopathy present.    He has no cervical adenopathy.  Neurological: He is alert. Gait normal.  Skin: No rash noted. He is not diaphoretic. No erythema. Nails show no clubbing.  Psychiatric: Mood and affect normal.    Diagnostics:    Spirometry was performed and demonstrated an FEV1 of 2.79 at 64 % of predicted.  Assessment and Plan:   1. Not well controlled moderate persistent asthma   2. Other allergic rhinitis   3. Dysfunction of both eustachian tubes   4. Food allergy   5. Gastroesophageal reflux disease, esophagitis presence not specified     1. Continue to perform Allergen avoidance measures as best as possible  2. Start a course of immunotherapy  3. Treat and prevent inflammation:   A. Arnuity 200 - one inhalation one time per day. Sample. Coupon.   B. montelukast 10 mg tablet once a day  C. Budesonide 0.5 mg in 50-100 ml nasal saline few times per week  4. Treat reflux:   A. OTC ranitidine 150 two tablets one time per day  5. If needed:   A. nasal saline / wash  B. OTC antihistamine  C. Auvi-Q 0.3  D. Proair HFA 2 inhalations every 4-6 hours  E. ibuprofen  6. Return to clinic in February 2019  or earlier if problem  7. Obtain fall flu vaccine when feeling better  I will try once again to have [name] use anti-inflammatory agents in a preventative manner for his respiratory tract inflammation with the use of Arnuity and nasal budesonide ampules.  Hopefully this will prevent him from developing significant problems with his airway as we move forward.  I suspect he currently has a viral respiratory tract infection and we are not going to give him any antibiotics but still provide him symptomatic medications if needed as noted above.  As well, he can treat reflux with ranitidine should it be required.  In the long run I think it would be best for him to start a course of immunotherapy as  this will change his immunological hyperreactivity and hopefully result in less inflammatory flares as he moves forward with this treatment.  I will see him back in his clinic in 12 weeks or earlier if there is a problem.  Laurette Schimke, MD Allergy / Immunology [facility]

## 2017-10-07 ENCOUNTER — Encounter: Payer: Self-pay | Admitting: Allergy and Immunology

## 2017-10-13 ENCOUNTER — Ambulatory Visit (INDEPENDENT_AMBULATORY_CARE_PROVIDER_SITE_OTHER): Payer: 59 | Admitting: "Endocrinology

## 2017-10-13 ENCOUNTER — Encounter (INDEPENDENT_AMBULATORY_CARE_PROVIDER_SITE_OTHER): Payer: Self-pay | Admitting: "Endocrinology

## 2017-10-13 VITALS — BP 120/78 | HR 88 | Ht 75.2 in | Wt 252.4 lb

## 2017-10-13 DIAGNOSIS — I10 Essential (primary) hypertension: Secondary | ICD-10-CM | POA: Diagnosis not present

## 2017-10-13 DIAGNOSIS — G4733 Obstructive sleep apnea (adult) (pediatric): Secondary | ICD-10-CM

## 2017-10-13 DIAGNOSIS — E1043 Type 1 diabetes mellitus with diabetic autonomic (poly)neuropathy: Secondary | ICD-10-CM

## 2017-10-13 DIAGNOSIS — IMO0001 Reserved for inherently not codable concepts without codable children: Secondary | ICD-10-CM

## 2017-10-13 DIAGNOSIS — K3184 Gastroparesis: Secondary | ICD-10-CM | POA: Diagnosis not present

## 2017-10-13 DIAGNOSIS — J309 Allergic rhinitis, unspecified: Secondary | ICD-10-CM

## 2017-10-13 DIAGNOSIS — E049 Nontoxic goiter, unspecified: Secondary | ICD-10-CM

## 2017-10-13 DIAGNOSIS — E063 Autoimmune thyroiditis: Secondary | ICD-10-CM | POA: Diagnosis not present

## 2017-10-13 DIAGNOSIS — R000 Tachycardia, unspecified: Secondary | ICD-10-CM | POA: Diagnosis not present

## 2017-10-13 DIAGNOSIS — E1065 Type 1 diabetes mellitus with hyperglycemia: Secondary | ICD-10-CM

## 2017-10-13 DIAGNOSIS — E10649 Type 1 diabetes mellitus with hypoglycemia without coma: Secondary | ICD-10-CM | POA: Diagnosis not present

## 2017-10-13 LAB — POCT GLUCOSE (DEVICE FOR HOME USE): GLUCOSE FASTING, POC: 191 mg/dL — AB (ref 70–99)

## 2017-10-13 LAB — POCT GLYCOSYLATED HEMOGLOBIN (HGB A1C): HEMOGLOBIN A1C: 6.8

## 2017-10-13 NOTE — Progress Notes (Signed)
Subjective:  Patient Name: [name] Date of Birth: [birth year]  MRN: [id number]  [name]  presents to the office today for follow-up of his type 1 diabetes mellitus, goiter, obesity, combined hyperlipidemia, adult ADHD, fatigue, autonomic neuropathy, tachycardia, hypertension, microalbuminuria, and hypoglycemia, sinusitis, bronchitis, and allergies.  HISTORY OF PRESENT ILLNESS:   [name] is a 36 y.o. Caucasian gentleman. [name] was unaccompanied.  1. The patient was first referred to me on 01/08/2006 by his family physician, Dr. Roanna Epley, for evaluation and management of type 1 diabetes mellitus and related problems. The patient was 36 years old.  A. [name] had been diagnosed with type 1 diabetes somewhere in 2001-2002, at the age of 57-19. He was initially diagnosed with type 2 diabetes mellitus, but was later re-classified as type 1 diabetes mellitus. He had been followed by Dr. Arther Dames, staff endocrinologist at [facility]. [name] was started on an insulin pump approximately 18 months prior to first seeing me. His pump was a Medtronic Paradigm 712. His blood glucose control was fair-to-poor. He was frequently not checking blood sugars or taking insulin boluses as often as he needed to. He frequently noted fast heart rate. The patient's past medical history was positive for hypertension, for which he was taking lisinopril, and for what his mother called "extreme ADHD". He had previously been taking ADHD medicines but had discontinued them due to adverse effects. He had prior ankle injuries and knee injuries, to include tears of the lateral menisci bilaterally. He had not had any surgeries. He was working for a [employer] that had many different job sites in different parts of the [place] and in other countries as well. 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 He did not use tobacco or drugs, but did drink alcohol occasionally.  B. On  physical examination, his weight was 234 pounds, his height was 70 inches, his BMI was 33.2. Blood pressure was 128/70. Hemoglobin A1c was 9.5%. Heart rate was 96. He had normal affect and fair insight. He had a 25-30 gram goiter. He had normal 1+ DP pulses in his feet. He had normal sensation in his feet to touch, vibration, and monofilament. His CMP was normal except for glucose of 251. His cholesterol was 257, triglycerides 143, HDL 49, and LDL 179. TSH was 1.017, free T4 was 1.06, and free T3 was 3.3. His urinary microalbumin: creatinine ratio was 20.1 (normal less than 30).   C. The patient clearly needed better blood glucose control, which would only occur if he had better adherence to his plan. He appeared to have autonomic neuropathy, manifested by inappropriate sinus tachycardia.  His thyroid goiter suggested that he might have evolving Hashimoto's disease. He stated that he had lost some weight recently. I encouraged him to check his blood sugars more frequently and to take both correction boluses and food boluses.  2. 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 His hemoglobin A1c values have varied from 8.3-11.2%. In the last year, however, his A1c's have varied from 9.5-10.5%. Throughout the 11 year period, the patient's autonomic neuropathy and tachycardia have remained essentially the same. On 12/03/10 his total cholesterol was 270, triglycerides 94, HDL 50, and LDL 201. I started him on Crestor, 10 mg per day. He has since begun  treatment with atorvastatin and his cholesterol values have improved. In March 2018 he was converted to a Medtronic 670G pump and Guardian 3 CGM sensor. Since then his BGs have been much better. Unfortunately, he has also had a series of episodes of bronchitis and sinusitis,  presumably due to severe allergies, which have caused him to be very sick for much of the past nine months.    3. The patient's last [facility] visit was on 08/24/17. At that visit I changed his ICRs. He believes that these changes helped his BG control. His allergies had been better for about a month, he felt much better, and had started working out again. Unfortunately he developed another bout of sinusitis about two weeks ago and his BGs were harder to control. He is feeling better now and his BGs have improved.    A.  He saw Dr. Lucie Leather on 09/26/17. Dr. Lucie Leather changed his medications again. [name] will start immunotherapy in January.    [name] has remained off his eye drops, trazodone, Wellbutrin, and atorvastatin since seeing Dr. Darrick Penna on 04/14/17.   C. His nausea has almost resolved. His spinning dizziness and vertigo resolved when he did not have nasal congestin, but worsened when the nasal congestion worsened. He still has daily headaches that seem to be associated with nasal congestion, but are usually "not too bad". The headaches remain bitemporal and posterior at times. His muscle pains have resolved. His jitteriness and tremors resolved when his nasal congestion was not active. His eyes remain sensitive to light when he has the active nasal congestion. He still occasionally has some spasm of his eye muscles.    D. He continues to have problems with lactose intolerance. When he avoids lactose, however, he is asymptomatic. Some foods such as shellfish cause his throat to swell up.   E. He is using Humalog aspart insulin in his insulin pump. He takes losartan, 50 mg/day.   F. He saw Dr. Vickey Huger in neurology on 05/28/17. His MRI was essentially normal.   G. He is still working from home. Due to his upper respiratory illness his hours have been cut back.   H. He had a sleep study on 06/18/17 that showed 1 episode of obstructive apnea per hour and 1.6 episodes of hypopnea per hour. These findings were not felt  to be significant for OSA. However, based upon the moderate amount of snoring that he did, a dental appliance was recommended. He has not pursued that recommendation yet.   I. He no longer feels a lump in his left lateral neck.     4. Pertinent Review of Systems: Constitutional: The patient feels "better". Eyes: Last exam was on 07/20/17. He is due for follow up next week.   Neck: He has not had had any more anterior neck swelling and tenderness.  Heart: His heart rate has been elevated at times, usually when he has nasal congestion and doesn't feel good.  The patient has no complaints of palpitations, irregular heat beats, chest pain, or chest pressure.  Gastrointestinal: He has much less nausea. The patient has no other complaints of bloating after meals, excessive hunger, heartburn, acid reflux, upset stomach, stomach aches or pains, or constipation. Legs: As above. Muscle mass and strength seem normal. There are no other complaints of numbness, tingling, burning, or pain. No edema is noted. Feet: There are no obvious foot problems now. There are no complaints of numbness, burning, or pain in the left foot.  No edema is noted.  Hypoglycemia: He has not been having many low BGs.   Emotional: He had a follow up appointment with Dr. Piedad Climes in [place] in November. Dr. Shane Crutch will wait until [name]'s overall health situation stabilizes before trying any new psych medications.     5. BG printout: We have data from the past 2 weeks. He changes sites every 2-3 days. He checks BGs 7-12 times daily. BGs varied from 57-306, compared with 82-294 at his last visit and with 87-304 at his prior visit. Average BG is 166, compared with 172 at his last visit and with 168 at the prior visit. His higher BGs occurred after lunch.    6. CGM printout: Skin glucose values and BG values correlate very well. Average SG was 143, compared with 148 at his last visit and with 145 at his prior visit  He was again in  auto mode 97% of the time. His highest SGs occurred around 1-2 PM and again from 7 PM. His lowest SGs occurred from 3 PM-6 PM, usually associated with exercise.     PAST MEDICAL, FAMILY, AND SOCIAL HISTORY:  Past Medical History:  Diagnosis Date  . ADHD (attention deficit hyperactivity disorder)   . Autonomic neuropathy due to diabetes (HCC)   . Diabetes (HCC)   . Fatigue   . Goiter   . High blood pressure   . High cholesterol   . Hypercholesterolemia   . Hypertension   . Hypoglycemia associated with diabetes (HCC)   . Obesity   . Tachycardia   . Type 1 diabetes mellitus not at goal [facility])   . Uncontrolled DM with microalbuminuria or microproteinuria     Family History  Problem Relation Age of Onset  . Dementia Paternal Aunt   . Schizophrenia Paternal Aunt   . Cancer Maternal Grandmother   . Diabetes Maternal Grandmother        T2 DM  . Cancer Maternal Grandfather   . Thyroid disease Neg Hx      Current Outpatient Medications:  .  albuterol (PROAIR HFA) 108 (90 Base) MCG/ACT inhaler, Inhale 2 puffs into the lungs every 6 (six) hours as needed for wheezing or shortness of breath., Disp: 1 Inhaler, Rfl: 2 .  ARNUITY ELLIPTA 200 MCG/ACT AEPB, INL 1 PUFF ITL D, Disp: , Rfl: 5 .  AUVI-Q 0.3 MG/0.3ML SOAJ injection, Use as directed for life-threatening allergic reaction., Disp: 4 Device, Rfl: 2 .  clonazePAM (KLONOPIN) 0.5 MG tablet, Take 0.5 mg by mouth 2 (two) times daily as needed., Disp: , Rfl: 0 .  HUMALOG 100 UNIT/ML injection, INJECT 300 UNITS VIA INSULIN PUMP EVERY 48 TO 72 HOURS PER HYPERGLYCEMIA AND DKA PROTOCOLS., Disp: 40 mL, Rfl: 5 .  losartan (COZAAR) 50 MG tablet, TAKE 1 TABLET(50 MG) BY MOUTH DAILY, Disp: 30 tablet, Rfl: 4 .  montelukast (SINGULAIR) 10 MG tablet, Take 1 tablet (10 mg total) by mouth at bedtime., Disp: 30 tablet, Rfl: 5 .  ONE TOUCH ULTRA TEST test strip, USE AS DIRECTED SIX TIMES DAILY, Disp: 200 each, Rfl: 5 .  ONE TOUCH ULTRA TEST test strip,  Check sugar 15 x daily (Patient not taking: Reported on 10/13/2017), Disp: 450 each, Rfl: 3 No current facility-administered medications for this visit.   Facility-Administered Medications Ordered in Other Visits:  .  gadopentetate dimeglumine (MAGNEVIST) injection 20 mL, 20 mL, Intravenous, Once PRN, Dohmeier, Porfirio Mylar, MD  Allergies as of 10/13/2017 - Review Complete 10/13/2017  Allergen Reaction Noted  . Flonase [fluticasone] Shortness Of  Breath 06/10/2017    1. Work and Family: He is now working at home at reduced hours. 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  2. Activities: He was more physically active when he felt good in November.    3. Smoking, alcohol, or drugs: Occasional beer, about one-two per month. No tobacco or drugs. 4. Primary Care Provider: Dr. Guerry Bruin, MD, [facility]  REVIEW OF SYSTEMS: There are no other significant problems involving Rolla's other body systems.   Objective:  Vital Signs:  BP 120/78   Pulse 88   Ht 6' 3.2" (1.91 m)   Wt 252 lb 6.4 oz (114.5 kg)   BMI 31.38 kg/m    Ht Readings from Last 3 Encounters:  10/13/17 6' 3.2" (1.91 m)  08/31/17 6' (1.829 m)  07/08/17 5\' 10"  (1.778 m)   Wt Readings from Last 3 Encounters:  10/13/17 252 lb 6.4 oz (114.5 kg)  08/31/17 253 lb (114.8 kg)  08/24/17 249 lb 3.2 oz (113 kg)   PHYSICAL EXAM:  Constitutional: The patient appears obese, but much better physically. He does not look tired or sick today. His weight increased 3 pounds since his last visit. His affect is normal. His insight is normal. He coughed once. Face: The face appears normal.  Eyes: There is no obvious arcus or proptosis. Moisture appears normal. The lower eyelids are only minimally swollen, c/w allergic conjunctivitis. .  Mouth: The oropharynx and tongue appear normal. Oral moisture is normal. Neck: The neck appears to be visibly normal. No carotid bruits are noted. He has a short neck.  His strap muscles are smaller. The thyroid gland is low lying and still enlarged at about 21 grams in size.The thyroid gland is not  tender to palpation today.  Lungs: The lungs are clear to auscultation. Air movement is good.  Heart: Heart rate and rhythm are regular. Heart sounds S1 and S2 are normal. I did not appreciate any pathologic cardiac murmurs. Abdomen: The abdomen is enlarged. Bowel sounds are normal. There is no obvious hepatomegaly, splenomegaly, or other mass effect.  Arms: Muscle size and bulk are normal for age. Hands: There is no tremor of his hands today. Phalangeal and metacarpophalangeal joints are normal. Palmar muscles are normal. Palmar skin is normal. Palmar moisture is also normal. Legs: Muscles appear normal for age. No edema is present. Feet: 2+ right DP pulse, 1-2+ left DP pulse. Neurologic: Strength is normal for age in both the upper and lower extremities. Muscle tone is normal. Sensation to touch is normal in both legs and both feet.     LAB DATA:   Labs 10/13/17: HbA1c 6.8%, CBG 191  Labs 08/24/17: CBG 117  Labs 07/20/17: CBG 216  Labs 07/08/17: IgA, IgG, and IgM normal. CBC normal.  Labs 06/16/17: HbA1c 6.5%, CBG 193  Labs 05/15/17: CBG 209; TSH 0.90, free T4 1.2, free T3 3.8  Labs 03/05/17: HbA1c 7.3%, CBG 241  Labs 02/20/17: CBG 180 fasting  Labs 01/29/17: CBG 148  Labs 01/12/17: CBG 116  Labs 12/29/16: HbA1c 9.1%, CBG 223; TSH 0.62, free T4 1.1, free T3 3.5; urine microalbumin/creatinine ratio 22; cholesterol 171, triglycerides 93, HDL 40, LDL 112;  CMP glucose 201  Labs 09/26/16: HbA1c 9.5%  Labs 08/13/16: Celiac panel negative; CMP normal except for glucose 207; CBC normal  Labs 06/13/16: HbA1c 9.8%; LH 3.6, FSH 2.3, testosterone 533 (ref [id number]), free testosterone 59.4 (ref 47-244)  Labs 02/11/16: HbA1c 9.2%  Labs 01/07/16:  HbA1c 10.5%; CBC with Hgb elevated at 17.2%; LH 3.9, FSH 2.0; TSH 1.53, free T4 1.3, free T3 3.7; cholesterol 262,  triglycerides 171, HDL 40, LDL 188; urinary microalbumin/creatinine ratio 31; CMP normal except for glucose 181  Labs 06/29/15. HbA1c 9.6%  Labs 12/28/14: Hemoglobin A1c 9.5% today, compared with 9.5%, at last visit and with 9.9% at the prior visit.  Labs 08/03/14: HbA1c 9.5%; TSH 0.548,free T4 1.10, free T3 3.7, TPO antibody < 1, TSI 30; CMP normal except glucose 191; urinary microalbumin/creatinine ratio 27; cholesterol 260, triglycerides 90, HDL 51, LDL 191   Labs 10/05/13: HbA1c 9.9%  Labs 04/23/13: CMP normal, except glucose 144; cholesterol 216, triglycerides 97, HDL 39, LDL 158; urinary microalbumin/creatinine ratio 6.9; TSH 0.505, free T4 1.17, free T3 3.8  Labs: 01/21/12: TSH 1.251, free T4 1.10, free T3 3.6, CBC normal, CMP normal except for glucose 206, urinary microalbumin/creatinine ratio 6.3, cholesterol 187, triglycerides 67, HDL 45, LDL 129   Assessment and Plan:   ASSESSMENT:  1. T1DM: His BGs are doing quite well overall, but vary with whether or not he has active allergic sinusitis/bronchitis and whether or not he feels well enough to exercise.   The combination of the Medtronic 670G pump and Guardian sensor are still helping him achieve better BG control.  2. Hypoglycemia: He has had several low SGs in the late afternoons, usually related to exercise.     3. Hypertension: SBP is good today, but DBP is still somewhat high. He needs to continue to take meds regularly and to exercise regularly when he feels well enough to do so.  4. Combined hyperlipidemia: His lipids in March 2017 were too high, but the lipids in March 2018 were much better on atorvastatin. He discontinued the atorvastatin due to concern that this medication might be causing his neuromuscular symptoms. We will plan to re-start the atorvastatin 1-3 months after he begins immunotherapy.  5. Autonomic neuropathy with tachycardia and gastroparesis: His heart rate and postprandial bloating have normalized over time  with better BG control.  6. Obesity: Weight has increased in the past month.        7-8. Goiter/thyroiditis:   A. His thyroid gland is unchanged in overall size. The process of waxing and waning of thyroid gland size is c/w evolving Hashimoto's thyroiditis. His thyroiditis is clinically quiescent.      B. He was euthyroid in March 2013, but was borderline hyperthyroid in June 2014. He was euthyroid in October 2015 and again in March 2017.  He was also euthyroid again in March 2018 and in July 2018.  9. Fatigue: This problem has improved.  10. Obstructive sleep apnea: He did not meet the criteria for OSA, but because of his snoring a dental appliance was suggested. I recommended that he follow up with the dental appliance. 11. Adult ADD: He had an appointment with Dr. Piedad Climes, MD in [place] again last month. Dr. Shane Crutch is waiting for [name]'s medical situation to stabilize before beginning any new psych meds.   12. Proliferative diabetic retinopathy: This problem is due to his long period of poorly controlled BGs. It will take at least 6-12 months of near-normal BGs for his retinopathy to stabilize.  He will have follow up exam soon.  13. URI/nasal congestion/vertigo/allergic rhinitis and conjunctivitis: He has been better recently. He continues to see Dr. Lucie Leather, who has ordered the start of immunotherapy in January.   14. Neuromuscular symptoms: Dr. Vickey Huger did not find any signs or any  serious neurologic disease. His MRI of his brain was unremarkable. Fortunately, his neuromuscular symptoms have resolved.  15. Calcified lymph node: It is possible that he may have contracted a fungus while he was working in the [employer] several years ago. His thyroid US showed his goiter, but no intrinsic thyroid abnormality. A partially calcified cervical lymph node on the right was noted, c/w a post infections/inflammatory node.  PLAN:  1. Diagnostic:  Repeat CBG  today. Call in two weeks on a  Sunday or Wednesday evening.  2. Therapeutic: Follow DM care plan. Continue losartan. Continue his current pump basal rates, ISFs, ICRs, and BG targets.  3. Patient education: We discussed the significant improvements in his health since his last visit. We discussed the logic of continuing his current pump settings until we have the opportunity to see how well the immunotherapy works for him. We also discussed the need for him to continue to be followed closely by Drs Lucie Leather and Shane Crutch.  4. Follow-up:  6 weeks  Level of Service: This visit lasted in excess of 50 minutes. More than 50% of the visit was devoted to counseling.  Molli Knock, MD, CDE Adult and Pediatric Endocrinology  10/13/2017 9:18 AM

## 2017-10-13 NOTE — Patient Instructions (Signed)
Follow up visit in 6 weeks.  

## 2017-10-23 ENCOUNTER — Other Ambulatory Visit: Payer: Self-pay | Admitting: Allergy and Immunology

## 2017-10-23 DIAGNOSIS — J3089 Other allergic rhinitis: Secondary | ICD-10-CM

## 2017-10-26 ENCOUNTER — Other Ambulatory Visit: Payer: Self-pay

## 2017-10-26 ENCOUNTER — Telehealth: Payer: Self-pay | Admitting: Allergy and Immunology

## 2017-10-26 MED ORDER — AMOXICILLIN-POT CLAVULANATE 875-125 MG PO TABS
1.0000 | ORAL_TABLET | Freq: Two times a day (BID) | ORAL | 0 refills | Status: DC
Start: 2017-10-26 — End: 2017-11-17

## 2017-10-26 NOTE — Telephone Encounter (Signed)
Patient has a sinus infection. Was told to call in if he was not feeling better Patient states that nothing was given for this issue What can he do?? Please call top answer any questions

## 2017-10-26 NOTE — Telephone Encounter (Signed)
Please prescribe Augmentin 875 one tablet two times a day for 14 days.

## 2017-10-26 NOTE — Telephone Encounter (Signed)
Informed pt and sent in rx 

## 2017-10-26 NOTE — Telephone Encounter (Signed)
Please advise 

## 2017-10-29 DIAGNOSIS — J301 Allergic rhinitis due to pollen: Secondary | ICD-10-CM | POA: Diagnosis not present

## 2017-10-29 NOTE — Progress Notes (Signed)
VIALS EXP 10-30-18 

## 2017-10-30 DIAGNOSIS — J3089 Other allergic rhinitis: Secondary | ICD-10-CM | POA: Diagnosis not present

## 2017-11-02 ENCOUNTER — Ambulatory Visit: Payer: 59

## 2017-11-02 ENCOUNTER — Ambulatory Visit
Admission: RE | Admit: 2017-11-02 | Discharge: 2017-11-02 | Disposition: A | Discharge status: Home / Self Care | Payer: 59 | Source: Ambulatory Visit | Attending: Otolaryngology | Admitting: Otolaryngology

## 2017-11-02 ENCOUNTER — Other Ambulatory Visit: Payer: Self-pay | Admitting: Otolaryngology

## 2017-11-02 DIAGNOSIS — J329 Chronic sinusitis, unspecified: Secondary | ICD-10-CM

## 2017-11-03 ENCOUNTER — Ambulatory Visit: Payer: 59

## 2017-11-03 ENCOUNTER — Other Ambulatory Visit: Payer: Self-pay | Admitting: Otolaryngology

## 2017-11-03 DIAGNOSIS — J329 Chronic sinusitis, unspecified: Secondary | ICD-10-CM

## 2017-11-10 ENCOUNTER — Ambulatory Visit (INDEPENDENT_AMBULATORY_CARE_PROVIDER_SITE_OTHER): Payer: 59 | Admitting: *Deleted

## 2017-11-10 ENCOUNTER — Other Ambulatory Visit: Payer: Self-pay

## 2017-11-10 ENCOUNTER — Telehealth: Payer: Self-pay | Admitting: Allergy and Immunology

## 2017-11-10 DIAGNOSIS — J309 Allergic rhinitis, unspecified: Secondary | ICD-10-CM | POA: Diagnosis not present

## 2017-11-10 MED ORDER — EPINEPHRINE 0.3 MG/0.3ML IJ SOAJ: 0.3000 mg | Freq: Once | INTRAMUSCULAR | 2 refills | Status: AC

## 2017-11-10 NOTE — Telephone Encounter (Signed)
[name] as per Dr. Kathyrn LassKozlow's last note. Pt advised that [facility] will contact Pt to schedule delivery.

## 2017-11-10 NOTE — Telephone Encounter (Signed)
Patient needs refill on EPI-PEN sent [facility] [address]

## 2017-11-12 NOTE — Progress Notes (Signed)
Immunotherapy   Patient Details  Name: [name] MRN: [id number] Date of Birth: 09/03/1981  11/10/2017  [name] started injections for  Pollen & Mold. Following schedule: B  Frequency:2 times per week Epi-Pen:Epi-Pen Available  Consent signed and patient instructions given. No problems after 30 minutes in the office.   Mariane DuvalHeather L Ivey Cina 11/10/2017, 6:11 PM

## 2017-11-13 ENCOUNTER — Ambulatory Visit (INDEPENDENT_AMBULATORY_CARE_PROVIDER_SITE_OTHER): Payer: 59

## 2017-11-13 DIAGNOSIS — J309 Allergic rhinitis, unspecified: Secondary | ICD-10-CM | POA: Diagnosis not present

## 2017-11-17 ENCOUNTER — Encounter (INDEPENDENT_AMBULATORY_CARE_PROVIDER_SITE_OTHER): Payer: Self-pay | Admitting: "Endocrinology

## 2017-11-17 ENCOUNTER — Ambulatory Visit (INDEPENDENT_AMBULATORY_CARE_PROVIDER_SITE_OTHER): Payer: 59 | Admitting: "Endocrinology

## 2017-11-17 ENCOUNTER — Ambulatory Visit (INDEPENDENT_AMBULATORY_CARE_PROVIDER_SITE_OTHER): Payer: 59 | Admitting: *Deleted

## 2017-11-17 VITALS — BP 112/78 | HR 82 | Wt 254.2 lb

## 2017-11-17 DIAGNOSIS — E1065 Type 1 diabetes mellitus with hyperglycemia: Secondary | ICD-10-CM

## 2017-11-17 DIAGNOSIS — E049 Nontoxic goiter, unspecified: Secondary | ICD-10-CM

## 2017-11-17 DIAGNOSIS — J309 Allergic rhinitis, unspecified: Secondary | ICD-10-CM | POA: Diagnosis not present

## 2017-11-17 DIAGNOSIS — E083551 Diabetes mellitus due to underlying condition with stable proliferative diabetic retinopathy, right eye: Secondary | ICD-10-CM

## 2017-11-17 DIAGNOSIS — I10 Essential (primary) hypertension: Secondary | ICD-10-CM

## 2017-11-17 DIAGNOSIS — G4733 Obstructive sleep apnea (adult) (pediatric): Secondary | ICD-10-CM | POA: Diagnosis not present

## 2017-11-17 DIAGNOSIS — E11649 Type 2 diabetes mellitus with hypoglycemia without coma: Secondary | ICD-10-CM | POA: Diagnosis not present

## 2017-11-17 DIAGNOSIS — E66812 Obesity, class 2: Secondary | ICD-10-CM

## 2017-11-17 DIAGNOSIS — E063 Autoimmune thyroiditis: Secondary | ICD-10-CM

## 2017-11-17 DIAGNOSIS — IMO0001 Reserved for inherently not codable concepts without codable children: Secondary | ICD-10-CM

## 2017-11-17 LAB — POCT GLUCOSE (DEVICE FOR HOME USE): POC Glucose: 170 mg/dl — AB (ref 70–99)

## 2017-11-17 NOTE — Progress Notes (Signed)
Subjective:  Patient Name: [name] Date of Birth: 05/21/1981  MRN: [id number]  Joseph ProntoGabriel Sagan  presents to the office today for follow-up of his type 1 diabetes mellitus, goiter, obesity, combined hyperlipidemia, adult ADHD, fatigue, autonomic neuropathy, tachycardia, hypertension, microalbuminuria, and hypoglycemia, sinusitis, bronchitis, and allergies.  HISTORY OF PRESENT ILLNESS:   [name] is a 37 y.o. Caucasian gentleman. [name] was unaccompanied.  1. The patient was first referred to me on 01/08/2006 by his family physician, Dr. Roanna EpleyBert Fields, for evaluation and management of type 1 diabetes mellitus and related problems. The patient was 37 years old.  A. [name] had been diagnosed with type 1 diabetes somewhere in 2001-2002, at the age of 36-19. He was initially diagnosed with type 2 diabetes mellitus, but was later re-classified as type 1 diabetes mellitus. He had been followed by Dr. Arther Damesom O'Connell, staff [facility]in[facility]. [name] was started on an insulin pump approximately 18 months prior to first seeing me. His pump was a Medtronic Paradigm 712. His blood glucose control was fair-to-poor. He was frequently not checking blood sugars or taking insulin boluses as often as he needed to. He frequently noted fast heart rate. The patient's past medical history was positive for hypertension, for which he was taking lisinopril, and for what his mother called "extreme ADHD". He had previously been taking ADHD medicines but had discontinued them due to adverse effects. He had prior ankle injuries and knee injuries, to include tears of the lateral menisci bilaterally. He had not had any surgeries. He was working for a [employer] company that had many different job sites in different parts of the [place] and in other countries as well. 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 He did not use tobacco or drugs, but did drink alcohol occasionally.  B. On  physical examination, his weight was 234 pounds, his height was 70 inches, his BMI was 33.2. Blood pressure was 128/70. Hemoglobin A1c was 9.5%. Heart rate was 96. He had normal affect and fair insight. He had a 25-30 gram goiter. He had normal 1+ DP pulses in his feet. He had normal sensation in his feet to touch, vibration, and monofilament. His CMP was normal except for glucose of 251. His cholesterol was 257, triglycerides 143, HDL 49, and LDL 179. TSH was 1.017, free T4 was 1.06, and free T3 was 3.3. His urinary microalbumin: creatinine ratio was 20.1 (normal less than 30).   C. The patient clearly needed better blood glucose control, which would only occur if he had better adherence to his plan. He appeared to have autonomic neuropathy, manifested by inappropriate sinus tachycardia.  His thyroid goiter suggested that he might have evolving Hashimoto's disease. He stated that he had lost some weight recently. I encouraged him to check his blood sugars more frequently and to take both correction boluses and food boluses.  2. 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 His hemoglobin A1c values have varied from 8.3-11.2%. In the last year, however, his A1c's have varied from 9.5-10.5%. Throughout the 11 year period, the patient's autonomic neuropathy and tachycardia have remained essentially the same. On 12/03/10 his total cholesterol was 270, triglycerides 94, HDL 50, and LDL 201. I started him on Crestor, 10 mg per day. He has since begun  treatment with atorvastatin and his cholesterol values had improved. In March 2018 he was converted to a Medtronic 670G pump and Guardian 3 CGM sensor. Since then his BGs have been much better. Unfortunately, he has also had a series of episodes of bronchitis and sinusitis, presumably  due to severe allergies, which have caused him to be very sick for much of the past year.    3. The patient's last [facility] visit was on 10/13/17. At that visit I continued his current insulin pump settings.  A. He is feeling much better. He did have to have a steroid injection for bronchitis and sinusitis on 11/06/16. His BGs did increase for several days. He started allergy shots last week and had his third shot this morning.     B. Joseph Beach has remained off his eye drops, trazodone, Wellbutrin, and atorvastatin since seeing Dr. Darrick Penna on 04/14/17.   C. His nausea is "not too bad, occasional".  His spinning dizziness and vertigo are a lot better. He still  has daily headaches that seem to be associated with nasal congestion, but are usually "not too bad". The headaches remain bitemporal and posterior at times. His muscle pains have resolved. His jitteriness and tremors resolved when his nasal congestion was not active. His eyes remain sensitive to light when he has the active nasal congestion. He still occasionally has some spasm of his eye muscles, but much less frequently.    D. He continues to have problems with lactose intolerance. When he avoids lactose, however, he is asymptomatic. Some foods such as shellfish cause his throat to swell up.   E. He is using Humalog aspart insulin in his insulin pump. He takes losartan, 50 mg/day.   F. He is still working from home. Due to his upper respiratory illness his hours have been cut back.   G. He had a sleep study on 06/18/17 that showed 1 episode of obstructive apnea per hour and 1.6 episodes of hypopnea per hour. These findings were not felt to be significant for OSA. However, based upon the moderate amount of snoring that he did, a dental appliance was recommended. He has not pursued that recommendation yet.     4. Pertinent Review of Systems: Constitutional: Joseph Beach feels "a lot better". Eyes: Last exam was in January 2019. Some growing vessels were seen. He had  another injection in his right eye.    Neck: He has not had had any more anterior neck swelling and tenderness.  Heart: His heart rate has been elevated at times, usually when he has nasal congestion and doesn't feel good.  The patient has no complaints of palpitations, irregular heat beats, chest pain, or chest pressure.  Gastrointestinal: He has much less nausea. The patient has no other complaints of bloating after meals, excessive hunger, heartburn, acid reflux, upset stomach, stomach aches or pains, or constipation. Legs: As above. Muscle mass and strength seem normal. There are no other complaints of numbness, tingling, burning, or pain. No edema is noted. Feet: There are no obvious foot problems now. There are no complaints of numbness, burning, or pain.  No edema is noted. Hypoglycemia: He has not been having many low BGs. The low BGs that he does have occur during or after exercise. Emotional: He had a follow up appointment with Dr. Piedad Climes in [place] in November. Dr. Shane Crutch will wait until [name]'s overall health situation stabilizes before trying any new psych medications.     5. BG printout: We have data from  the past 2 weeks. He changes sites every 2-3 days. He checks BGs 6-11 times daily. BGs varied from 87-292, compared with 57-306 at his last visit and with 82-294 at his prior visit. Average BG is 169, compared with 166 at his last visit and with 172 at his prior visit. His higher BGs occurred after brunch/lunch at times and for several days after his steroid injection.    6. CGM printout: Skin glucose values and BG values correlate very well. Average SG was 148, compared with 143 at his last visit and with 148 at his prior visit  He was in auto mode 98% of the time. His highest SGs occurred around 1-2 PM and again from 7 PM. His lowest SGs occurred from 3 PM-6 PM, usually associated with exercise.     PAST MEDICAL, FAMILY, AND SOCIAL HISTORY:  Past Medical History:   Diagnosis Date  . ADHD (attention deficit hyperactivity disorder)   . Autonomic neuropathy due to diabetes (HCC)   . Diabetes (HCC)   . Fatigue   . Goiter   . High blood pressure   . High cholesterol   . Hypercholesterolemia   . Hypertension   . Hypoglycemia associated with diabetes (HCC)   . Obesity   . Tachycardia   . Type 1 diabetes mellitus not at goal [facility])   . Uncontrolled DM with microalbuminuria or microproteinuria     Family History  Problem Relation Age of Onset  . Dementia Paternal Aunt   . Schizophrenia Paternal Aunt   . Cancer Maternal Grandmother   . Diabetes Maternal Grandmother        T2 DM  . Cancer Maternal Grandfather   . Thyroid disease Neg Hx      Current Outpatient Medications:  .  ARNUITY ELLIPTA 200 MCG/ACT AEPB, INL 1 PUFF ITL D, Disp: , Rfl: 5 .  AUVI-Q 0.3 MG/0.3ML SOAJ injection, Use as directed for life-threatening allergic reaction., Disp: 4 Device, Rfl: 2 .  clonazePAM (KLONOPIN) 0.5 MG tablet, Take 0.5 mg by mouth 2 (two) times daily as needed., Disp: , Rfl: 0 .  HUMALOG 100 UNIT/ML injection, INJECT 300 UNITS VIA INSULIN PUMP EVERY 48 TO 72 HOURS PER HYPERGLYCEMIA AND DKA PROTOCOLS., Disp: 40 mL, Rfl: 5 .  losartan (COZAAR) 50 MG tablet, TAKE 1 TABLET(50 MG) BY MOUTH DAILY, Disp: 30 tablet, Rfl: 4 .  montelukast (SINGULAIR) 10 MG tablet, Take 1 tablet (10 mg total) by mouth at bedtime., Disp: 30 tablet, Rfl: 5 .  ONE TOUCH ULTRA TEST test strip, USE AS DIRECTED SIX TIMES DAILY, Disp: 200 each, Rfl: 5 .  albuterol (PROAIR HFA) 108 (90 Base) MCG/ACT inhaler, Inhale 2 puffs into the lungs every 6 (six) hours as needed for wheezing or shortness of breath., Disp: 1 Inhaler, Rfl: 2 .  ONE TOUCH ULTRA TEST test strip, Check sugar 15 x daily (Patient not taking: Reported on 10/13/2017), Disp: 450 each, Rfl: 3 No current facility-administered medications for this visit.   Facility-Administered Medications Ordered in Other Visits:  .  gadopentetate  dimeglumine (MAGNEVIST) injection 20 mL, 20 mL, Intravenous, Once PRN, Dohmeier, Porfirio Mylar, MD  Allergies as of 11/17/2017 - Review Complete 10/13/2017  Allergen Reaction Noted  . Flonase [fluticasone] Shortness Of Breath 06/10/2017    1. Work and Family: He is now working at home at reduced hours. 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 He hopes to return to full-time work soon. 2. Activities: He was more physically active  when he felt good in November.    3. Smoking, alcohol, or drugs: Occasional beer, about one-two per month. No tobacco or drugs. 4. Primary Care Provider: Dr. Guerry Bruin, MD, [facility]  REVIEW OF SYSTEMS: There are no other significant problems involving Ryzen's other body systems.   Objective:  Vital Signs:  BP 112/78   Pulse 82   Wt 254 lb 3.2 oz (115.3 kg)   BMI 31.61 kg/m    Ht Readings from Last 3 Encounters:  10/13/17 6' 3.2" (1.91 m)  08/31/17 6' (1.829 m)  07/08/17 5\' 10"  (1.778 m)   Wt Readings from Last 3 Encounters:  11/17/17 254 lb 3.2 oz (115.3 kg)  10/13/17 252 lb 6.4 oz (114.5 kg)  08/31/17 253 lb (114.8 kg)   PHYSICAL EXAM:  Constitutional: The patient appears healthy and much better physically, but still obese. He looks quite normal today, the best that he has looked in months. His weight increased almost 2 pounds since his last visit. His affect is normal. His insight is normal. He did not cough today. Face: The face appears normal.  Eyes: There is no obvious arcus or proptosis. Moisture appears normal.  Mouth: The oropharynx and tongue appear normal. Oral moisture is normal. Neck: The neck appears to be visibly normal. No carotid bruits are noted. He has a short neck. His strap muscles are smaller. The thyroid gland is low lying and is a bit more enlarged at about 21-22 grams in size. The left lobe is larger today. The thyroid gland is tender to palpation in the left mid-lobe today.  Lungs: The  lungs are clear to auscultation. Air movement is good.  Heart: Heart rate and rhythm are regular. Heart sounds S1 and S2 are normal. I did not appreciate any pathologic cardiac murmurs. Abdomen: The abdomen is enlarged. Bowel sounds are normal. There is no obvious hepatomegaly, splenomegaly, or other mass effect.  Arms: Muscle size and bulk are normal for age. Hands: There is no tremor of his hands today. Phalangeal and metacarpophalangeal joints are normal. Palmar muscles are normal. Palmar skin is normal. Palmar moisture is also normal. Legs: Muscles appear normal for age. No edema is present. Feet: 1+ right DP pulse, 1+ left DP pulse. Neurologic: Strength is normal for age in both the upper and lower extremities. Muscle tone is normal. Sensation to touch is normal in both legs and both feet.     LAB DATA:   Labs 11/17/17: CBG 170  Labs 10/13/17: HbA1c 6.8%, CBG 191  Labs 08/24/17: CBG 117  Labs 07/20/17: CBG 216  Labs 07/08/17: IgA, IgG, and IgM normal. CBC normal.  Labs 06/16/17: HbA1c 6.5%, CBG 193  Labs 05/15/17: CBG 209; TSH 0.90, free T4 1.2, free T3 3.8  Labs 03/05/17: HbA1c 7.3%, CBG 241  Labs 02/20/17: CBG 180 fasting  Labs 01/29/17: CBG 148  Labs 01/12/17: CBG 116  Labs 12/29/16: HbA1c 9.1%, CBG 223; TSH 0.62, free T4 1.1, free T3 3.5; urine microalbumin/creatinine ratio 22; cholesterol 171, triglycerides 93, HDL 40, LDL 112;  CMP glucose 201  Labs 09/26/16: HbA1c 9.5%  Labs 08/13/16: Celiac panel negative; CMP normal except for glucose 207; CBC normal  Labs 06/13/16: HbA1c 9.8%; LH 3.6, FSH 2.3, testosterone 533 (ref [id number]), free testosterone 59.4 (ref 47-244)  Labs 02/11/16: HbA1c 9.2%  Labs 01/07/16: HbA1c 10.5%; CBC with Hgb elevated at 17.2%; LH 3.9, FSH 2.0; TSH 1.53, free T4 1.3, free T3 3.7; cholesterol 262, triglycerides 171, HDL 40, LDL 188;  urinary microalbumin/creatinine ratio 31; CMP normal except for glucose 181  Labs 06/29/15. HbA1c 9.6%  Labs 12/28/14:  Hemoglobin A1c 9.5% today, compared with 9.5%, at last visit and with 9.9% at the prior visit.  Labs 08/03/14: HbA1c 9.5%; TSH 0.548,free T4 1.10, free T3 3.7, TPO antibody < 1, TSI 30; CMP normal except glucose 191; urinary microalbumin/creatinine ratio 27; cholesterol 260, triglycerides 90, HDL 51, LDL 191   Labs 10/05/13: HbA1c 9.9%  Labs 04/23/13: CMP normal, except glucose 144; cholesterol 216, triglycerides 97, HDL 39, LDL 158; urinary microalbumin/creatinine ratio 6.9; TSH 0.505, free T4 1.17, free T3 3.8  Labs: 01/21/12: TSH 1.251, free T4 1.10, free T3 3.6, CBC normal, CMP normal except for glucose 206, urinary microalbumin/creatinine ratio 6.3, cholesterol 187, triglycerides 67, HDL 45, LDL 129   Assessment and Plan:   ASSESSMENT:  1. T1DM: His BGs are doing quite well overall, but still vary with whether or not he has active allergic sinusitis/bronchitis and whether or not he feels well enough to exercise.   The combination of the Medtronic 670G pump and Guardian sensor are really helping him achieve good BG control.  2. Hypoglycemia: He has had several SGs <70, but no BGs <87. His lower SGs usually occur in association with exercise.   3. Hypertension: SBP is good today, but DBP is still somewhat high. He needs to continue to take meds regularly and to exercise regularly when he feels well enough to do so.  4. Combined hyperlipidemia: His lipids in March 2017 were too high, but the lipids in March 2018 were much better on atorvastatin. He discontinued the atorvastatin due to concern that this medication might be causing his neuromuscular symptoms. We will plan to re-start the atorvastatin 1-3 months after he begins immunotherapy.  5. Autonomic neuropathy with tachycardia and gastroparesis: His heart rate and postprandial bloating have normalized over time with better BG control.  6. Obesity: Weight has increased again in the past month.        7-8. Goiter/thyroiditis:   A. His thyroid  gland is slightly larger today and he has active thyroiditis inflammation today. The active thyroiditis and the  process of waxing and waning of thyroid gland size are c/w evolving Hashimoto's thyroiditis.   B. He was euthyroid in March 2013, but was borderline hyperthyroid in June 2014. He was euthyroid in October 2015 and again in March 2017.  He was also euthyroid again in March 2018 and in July 2018.  9. Fatigue: This problem has significantly improved.  10. Obstructive sleep apnea: He did not meet the criteria for OSA, but because of his snoring a dental appliance was suggested. I recommended that he obtain a dental appliance. Unfortunately, his former dentist no longer accepts [employer], so Joseph Beach has to find anew dentist. 11. Adult ADD: He had an appointment with Dr. Piedad Climes, MD in [place] in November 2018. Dr. Shane Crutch is waiting for [name]'s medical situation to stabilize before beginning any new psych meds.   12. Proliferative diabetic retinopathy: This problem is due to his long period of poorly controlled BGs. It will take at least 6-12 months of near-normal BGs for his retinopathy to stabilize.  He will have follow up exam soon.  13. URI/nasal congestion/vertigo/allergic rhinitis and conjunctivitis: He has been better recently. He is now on immunotherapy.    14. Neuromuscular symptoms: Dr. Vickey Huger did not find any signs or any serious neurologic disease. His MRI of his brain was unremarkable. Fortunately, his neuromuscular  symptoms have resolved.  15. Calcified lymph node: It is possible that he may have contracted a fungus while he was working in the [employer] several years ago. His thyroid US showed his goiter, but no intrinsic thyroid abnormality. A partially calcified cervical lymph node on the right was noted, c/w a post infections/inflammatory node.  PLAN:  1. Diagnostic:  Repeat CBG  today. Call in two weeks on a Sunday or Wednesday evening to discuss  BGs.  2. Therapeutic: Try Breathe Tight nasal strips. Follow DM care plan. Continue losartan. Continue his current pump basal rates, ISFs, and BG targets.  New ICRs:  MN: 7 8 AM: 5.00 11 AM: 5 ->4 9 PM: 7 3. Patient education: We discussed the significant improvements in his health since his last visit. We discussed the logic of increasing his ICRs slightly. We also discussed the need for him to continue to be followed closely by Drs Lucie Leather and Shane Crutch.  4. Follow-up:  2 months  Level of Service: This visit lasted in excess of 55 minutes. More than 50% of the visit was devoted to counseling.  Molli Knock, MD, CDE Adult and Pediatric Endocrinology  11/17/2017 11:30 AM

## 2017-11-17 NOTE — Patient Instructions (Signed)
follow up visit in 2 months. Please call Dr. Fransico Elmer Boutelle in two weeks on a Wednesday or Sunday evening to discuss BGs.

## 2017-11-19 ENCOUNTER — Ambulatory Visit (INDEPENDENT_AMBULATORY_CARE_PROVIDER_SITE_OTHER): Payer: 59 | Admitting: *Deleted

## 2017-11-19 DIAGNOSIS — J309 Allergic rhinitis, unspecified: Secondary | ICD-10-CM | POA: Diagnosis not present

## 2017-11-24 ENCOUNTER — Ambulatory Visit (INDEPENDENT_AMBULATORY_CARE_PROVIDER_SITE_OTHER): Payer: 59 | Admitting: *Deleted

## 2017-11-24 DIAGNOSIS — J309 Allergic rhinitis, unspecified: Secondary | ICD-10-CM | POA: Diagnosis not present

## 2017-11-26 ENCOUNTER — Ambulatory Visit (INDEPENDENT_AMBULATORY_CARE_PROVIDER_SITE_OTHER): Payer: 59

## 2017-11-26 DIAGNOSIS — J309 Allergic rhinitis, unspecified: Secondary | ICD-10-CM

## 2017-12-01 ENCOUNTER — Other Ambulatory Visit: Payer: Self-pay | Admitting: Allergy and Immunology

## 2017-12-01 DIAGNOSIS — H6983 Other specified disorders of Eustachian tube, bilateral: Secondary | ICD-10-CM

## 2017-12-01 DIAGNOSIS — Z91018 Allergy to other foods: Secondary | ICD-10-CM

## 2017-12-01 DIAGNOSIS — J3089 Other allergic rhinitis: Secondary | ICD-10-CM

## 2017-12-02 ENCOUNTER — Ambulatory Visit: Payer: 59 | Admitting: Neurology

## 2017-12-04 ENCOUNTER — Ambulatory Visit (INDEPENDENT_AMBULATORY_CARE_PROVIDER_SITE_OTHER): Payer: 59

## 2017-12-04 DIAGNOSIS — J309 Allergic rhinitis, unspecified: Secondary | ICD-10-CM

## 2017-12-08 ENCOUNTER — Encounter: Payer: Self-pay | Admitting: Allergy and Immunology

## 2017-12-08 ENCOUNTER — Ambulatory Visit: Payer: 59 | Admitting: Allergy and Immunology

## 2017-12-08 VITALS — BP 116/80 | HR 84 | Resp 16

## 2017-12-08 DIAGNOSIS — Z91018 Allergy to other foods: Secondary | ICD-10-CM

## 2017-12-08 DIAGNOSIS — J3089 Other allergic rhinitis: Secondary | ICD-10-CM

## 2017-12-08 DIAGNOSIS — J4540 Moderate persistent asthma, uncomplicated: Secondary | ICD-10-CM

## 2017-12-08 DIAGNOSIS — K219 Gastro-esophageal reflux disease without esophagitis: Secondary | ICD-10-CM

## 2017-12-08 DIAGNOSIS — H6983 Other specified disorders of Eustachian tube, bilateral: Secondary | ICD-10-CM | POA: Diagnosis not present

## 2017-12-08 NOTE — Progress Notes (Signed)
Follow-up Note  Referring Provider: Gaspar Garbeisovec, Richard W, MD Primary Provider: Gaspar Garbeisovec, Richard W, MD Date of Office Visit: 12/08/2017  Subjective:   [name] (DOB: 08/06/1981) is a 37 y.o. male who [facility] in re-evaluation of the following:  HPI: Joseph BeachGabe presents to this clinic in evaluation of asthma and allergic rhinitis and reflux and ETD and shellfish allergy.  He was last seen in this clinic 06 October 2017.  He has started a course of immunotherapy and is doing quite well with this form of treatment without any adverse effect.  Currently he is receiving immunotherapy every week.  Overall he believes that his respiratory tract is actually doing relatively well.  He did have a recent "head cold" but this only lasted for 5 days and he resolved this on his own without the use of any antibiotics or other medications.  He does not need to use a short acting bronchodilator greater than 1 time per week and he can exercise without any difficulty.  He has not been getting any chest pain while using his Arnuity as he did with Symbicort.  He can breathe through his nose without any difficulty.  He had to cut out his budesonide washes because they did give rise to some irritation within his nose.  He has not been having any issues with reflux and he uses over-the-counter Tums once or twice a week at this point in time and does not rely on the use of ranitidine.  He did obtain a flu vaccine  Allergies as of 12/08/2017      Reactions   Flonase [fluticasone] Shortness Of Breath      Medication List      albuterol 108 (90 Base) MCG/ACT inhaler Commonly known as:  PROAIR HFA Inhale 2 puffs into the lungs every 6 (six) hours as needed for wheezing or shortness of breath.   ARNUITY ELLIPTA 200 MCG/ACT Aepb Generic drug:  Fluticasone Furoate INL 1 PUFF ITL D   AUVI-Q 0.3 mg/0.3 mL Soaj injection Generic drug:  EPINEPHrine Use as  directed for life-threatening allergic reaction.   clonazePAM 0.5 MG tablet Commonly known as:  KLONOPIN Take 0.5 mg by mouth 2 (two) times daily as needed.   HUMALOG 100 UNIT/ML injection Generic drug:  insulin lispro INJECT 300 UNITS VIA INSULIN PUMP EVERY 48 TO 72 HOURS PER HYPERGLYCEMIA AND DKA PROTOCOLS.   losartan 50 MG tablet Commonly known as:  COZAAR TAKE 1 TABLET(50 MG) BY MOUTH DAILY   montelukast 10 MG tablet Commonly known as:  SINGULAIR TAKE 1 TABLET(10 MG) BY MOUTH AT BEDTIME   ONE TOUCH ULTRA TEST test strip Generic drug:  glucose blood Check sugar 15 x daily   ONE TOUCH ULTRA TEST test strip Generic drug:  glucose blood USE AS DIRECTED SIX TIMES DAILY       Past Medical History:  Diagnosis Date  . ADHD (attention deficit hyperactivity disorder)   . Autonomic neuropathy due to diabetes (HCC)   . Diabetes (HCC)   . Fatigue   . Goiter   . High blood pressure   . High cholesterol   . Hypercholesterolemia   . Hypertension   . Hypoglycemia associated with diabetes (HCC)   . Obesity   . Tachycardia   . Type 1 diabetes mellitus not at goal [facility](HCC)   . Uncontrolled DM with microalbuminuria or microproteinuria     Past Surgical History:  Procedure Laterality Date  .  none    . WISDOM TOOTH EXTRACTION      Review of systems negative except as noted in HPI / PMHx or noted below:  Review of Systems  Constitutional: Negative.   HENT: Negative.   Eyes: Negative.   Respiratory: Negative.   Cardiovascular: Negative.   Gastrointestinal: Negative.   Genitourinary: Negative.   Musculoskeletal: Negative.   Skin: Negative.   Neurological: Negative.   Endo/Heme/Allergies: Negative.   Psychiatric/Behavioral: Negative.      Objective:   Vitals:   12/08/17 1036  BP: 116/80  Pulse: 84  Resp: 16          Physical Exam  Constitutional: He is well-developed, well-nourished, and in no distress.  HENT:  Head: Normocephalic.  Right Ear: Tympanic  membrane, external ear and ear canal normal.  Left Ear: Tympanic membrane, external ear and ear canal normal.  Nose: Nose normal. No mucosal edema or rhinorrhea.  Mouth/Throat: Uvula is midline, oropharynx is clear and moist and mucous membranes are normal. No oropharyngeal exudate.  Eyes: Conjunctivae are normal.  Neck: Trachea normal. No tracheal tenderness present. No tracheal deviation present. No thyromegaly present.  Cardiovascular: Normal rate, regular rhythm, S1 normal, S2 normal and normal heart sounds.  No murmur heard. Pulmonary/Chest: Breath sounds normal. No stridor. No respiratory distress. He has no wheezes. He has no rales.  Musculoskeletal: He exhibits no edema.  Lymphadenopathy:       Head (right side): No tonsillar adenopathy present.       Head (left side): No tonsillar adenopathy present.    He has no cervical adenopathy.  Neurological: He is alert. Gait normal.  Skin: No rash noted. He is not diaphoretic. No erythema. Nails show no clubbing.  Psychiatric: Mood and affect normal.    Diagnostics:    Spirometry was performed and demonstrated an FEV1 of 2.89 at 59 % of predicted.  The patient had an Asthma Control Test with the following results: ACT Total Score: 14.    Assessment and Plan:   1. Asthma, moderate persistent, well-controlled   2. Other allergic rhinitis   3. ETD (Eustachian tube dysfunction), bilateral   4. Gastroesophageal reflux disease, esophagitis presence not specified   5. Food allergy     1. Continue to perform Allergen avoidance measures as best as possible  2. Continue immunotherapy  3. Continue to Treat and prevent inflammation:   A. Arnuity 200 - one inhalation one time per day. Sample. Coupon.   B. montelukast 10 mg tablet once a day  C. Budesonide 0.5 mg in 50-100 ml nasal saline few times per week  4. Treat reflux:   A. OTC tums if needed  5. If needed:   A. nasal saline / wash  B. OTC antihistamine  C. Auvi-Q 0.3  D.  Proair HFA 2 inhalations every 4-6 hours  E. ibuprofen  6. Return to clinic in June 2019 or earlier if problem  [name] appears to be doing relatively well with his respiratory inflammatory condition and reflux on his current plan which includes a large collection of anti-inflammatory medications for his respiratory tract and immunotherapy as well as over-the-counter medications for reflux. Assuming he does well I will see him back in his clinic in June 2019 or earlier if there is a problem.  Laurette Schimke, MD Allergy / Immunology [facility]

## 2017-12-08 NOTE — Patient Instructions (Addendum)
  1. Continue to perform Allergen avoidance measures as best as possible  2. Continue immunotherapy  3. Continue to Treat and prevent inflammation:   A. Arnuity 200 - one inhalation one time per day. Sample. Coupon.   B. montelukast 10 mg tablet once a day  C. Budesonide 0.5 mg in 50-100 ml nasal saline few times per week  4. Treat reflux:   A. OTC tums if needed  5. If needed:   A. nasal saline / wash  B. OTC antihistamine  C. Auvi-Q 0.3  D. Proair HFA 2 inhalations every 4-6 hours  E. ibuprofen  6. Return to clinic in June 2019 or earlier if problem

## 2017-12-09 ENCOUNTER — Encounter: Payer: Self-pay | Admitting: Allergy and Immunology

## 2017-12-09 NOTE — Addendum Note (Signed)
Addended by: Dub MikesHICKS, ASHLEY N on: 12/09/2017 08:42 AM   Modules accepted: Orders

## 2017-12-10 ENCOUNTER — Ambulatory Visit (INDEPENDENT_AMBULATORY_CARE_PROVIDER_SITE_OTHER): Payer: 59 | Admitting: *Deleted

## 2017-12-10 DIAGNOSIS — J309 Allergic rhinitis, unspecified: Secondary | ICD-10-CM

## 2017-12-15 ENCOUNTER — Ambulatory Visit (INDEPENDENT_AMBULATORY_CARE_PROVIDER_SITE_OTHER): Payer: 59 | Admitting: *Deleted

## 2017-12-15 DIAGNOSIS — J309 Allergic rhinitis, unspecified: Secondary | ICD-10-CM | POA: Diagnosis not present

## 2017-12-17 ENCOUNTER — Ambulatory Visit (INDEPENDENT_AMBULATORY_CARE_PROVIDER_SITE_OTHER): Payer: 59 | Admitting: *Deleted

## 2017-12-17 DIAGNOSIS — J309 Allergic rhinitis, unspecified: Secondary | ICD-10-CM | POA: Diagnosis not present

## 2017-12-22 ENCOUNTER — Ambulatory Visit (INDEPENDENT_AMBULATORY_CARE_PROVIDER_SITE_OTHER): Payer: 59 | Admitting: *Deleted

## 2017-12-22 DIAGNOSIS — J309 Allergic rhinitis, unspecified: Secondary | ICD-10-CM

## 2017-12-24 ENCOUNTER — Ambulatory Visit (INDEPENDENT_AMBULATORY_CARE_PROVIDER_SITE_OTHER): Payer: 59 | Admitting: *Deleted

## 2017-12-24 DIAGNOSIS — J309 Allergic rhinitis, unspecified: Secondary | ICD-10-CM

## 2017-12-29 ENCOUNTER — Ambulatory Visit (INDEPENDENT_AMBULATORY_CARE_PROVIDER_SITE_OTHER): Payer: 59 | Admitting: *Deleted

## 2017-12-29 DIAGNOSIS — J309 Allergic rhinitis, unspecified: Secondary | ICD-10-CM

## 2017-12-31 ENCOUNTER — Ambulatory Visit (INDEPENDENT_AMBULATORY_CARE_PROVIDER_SITE_OTHER): Payer: 59 | Admitting: *Deleted

## 2017-12-31 DIAGNOSIS — J309 Allergic rhinitis, unspecified: Secondary | ICD-10-CM

## 2018-01-05 ENCOUNTER — Ambulatory Visit (INDEPENDENT_AMBULATORY_CARE_PROVIDER_SITE_OTHER): Payer: 59 | Admitting: *Deleted

## 2018-01-05 DIAGNOSIS — J309 Allergic rhinitis, unspecified: Secondary | ICD-10-CM

## 2018-01-07 ENCOUNTER — Ambulatory Visit (INDEPENDENT_AMBULATORY_CARE_PROVIDER_SITE_OTHER): Payer: 59 | Admitting: *Deleted

## 2018-01-07 DIAGNOSIS — J309 Allergic rhinitis, unspecified: Secondary | ICD-10-CM | POA: Diagnosis not present

## 2018-01-12 ENCOUNTER — Ambulatory Visit (INDEPENDENT_AMBULATORY_CARE_PROVIDER_SITE_OTHER): Payer: 59 | Admitting: *Deleted

## 2018-01-12 DIAGNOSIS — J309 Allergic rhinitis, unspecified: Secondary | ICD-10-CM

## 2018-01-13 ENCOUNTER — Encounter (INDEPENDENT_AMBULATORY_CARE_PROVIDER_SITE_OTHER): Payer: Self-pay | Admitting: "Endocrinology

## 2018-01-13 ENCOUNTER — Ambulatory Visit (INDEPENDENT_AMBULATORY_CARE_PROVIDER_SITE_OTHER): Payer: 59 | Admitting: "Endocrinology

## 2018-01-13 VITALS — BP 132/78 | HR 88 | Ht 71.5 in | Wt 260.0 lb

## 2018-01-13 DIAGNOSIS — E1043 Type 1 diabetes mellitus with diabetic autonomic (poly)neuropathy: Secondary | ICD-10-CM | POA: Diagnosis not present

## 2018-01-13 DIAGNOSIS — E1065 Type 1 diabetes mellitus with hyperglycemia: Secondary | ICD-10-CM

## 2018-01-13 DIAGNOSIS — I10 Essential (primary) hypertension: Secondary | ICD-10-CM

## 2018-01-13 DIAGNOSIS — E10649 Type 1 diabetes mellitus with hypoglycemia without coma: Secondary | ICD-10-CM | POA: Diagnosis not present

## 2018-01-13 DIAGNOSIS — E668 Other obesity: Secondary | ICD-10-CM | POA: Diagnosis not present

## 2018-01-13 DIAGNOSIS — G4733 Obstructive sleep apnea (adult) (pediatric): Secondary | ICD-10-CM

## 2018-01-13 DIAGNOSIS — E049 Nontoxic goiter, unspecified: Secondary | ICD-10-CM

## 2018-01-13 DIAGNOSIS — E669 Obesity, unspecified: Secondary | ICD-10-CM

## 2018-01-13 DIAGNOSIS — R000 Tachycardia, unspecified: Secondary | ICD-10-CM

## 2018-01-13 DIAGNOSIS — I4711 Inappropriate sinus tachycardia, so stated: Secondary | ICD-10-CM

## 2018-01-13 DIAGNOSIS — E063 Autoimmune thyroiditis: Secondary | ICD-10-CM

## 2018-01-13 DIAGNOSIS — E782 Mixed hyperlipidemia: Secondary | ICD-10-CM

## 2018-01-13 DIAGNOSIS — R5383 Other fatigue: Secondary | ICD-10-CM

## 2018-01-13 DIAGNOSIS — IMO0001 Reserved for inherently not codable concepts without codable children: Secondary | ICD-10-CM

## 2018-01-13 LAB — POCT GLYCOSYLATED HEMOGLOBIN (HGB A1C): Hemoglobin A1C: 6.8

## 2018-01-13 LAB — POCT GLUCOSE (DEVICE FOR HOME USE): POC GLUCOSE: 194 mg/dL — AB (ref 70–99)

## 2018-01-13 NOTE — Patient Instructions (Signed)
Follow up visit in two months. Call on a Sunday evening in the next 3-4 weeks between 8:00-9:30 PM to discuss BGs.

## 2018-01-13 NOTE — Progress Notes (Signed)
Subjective:  Patient Name: [name] Date of Birth: 21-Nov-1980  MRN: [id number]  [name]  presents to the office today for follow-up of his type 1 diabetes mellitus, goiter, obesity, combined hyperlipidemia, adult ADHD, fatigue, autonomic neuropathy, tachycardia, hypertension, microalbuminuria, and hypoglycemia, sinusitis, bronchitis, and allergies.  HISTORY OF PRESENT ILLNESS:   [name] is a 37 y.o. Caucasian gentleman. [name] unaccompanied.  1. The patient [name] first referred to me on 01/08/2006 by his family physician, Dr. Roanna Epley, for evaluation and management of type 1 diabetes mellitus and related problems. The patient [name] 37 years old.  A. [name] had been diagnosed with type 1 diabetes somewhere in 2001-2002, at the age of 29-19. He [name] initially diagnosed with type 2 diabetes mellitus, but [name] later re-classified as type 1 diabetes mellitus. He had been followed by Dr. Arther Dames, staff endocrinologist at [facility]. [name] started on an insulin pump approximately 18 months prior to first seeing me. His pump [name] a Medtronic Paradigm 712. His blood glucose control [name] fair-to-poor. He [name] frequently not checking blood sugars or taking insulin boluses as often as he needed to. He frequently noted fast heart rate. The patient's past medical history [name] positive for hypertension, for which he [name] taking lisinopril, and for what his mother called "extreme ADHD". He had previously been taking ADHD medicines but had discontinued them due to adverse effects. He had prior ankle injuries and knee injuries, to include tears of the lateral menisci bilaterally. He had not had any surgeries. He [name] working for a [employer] that had many different job sites in different parts of the [place] and in other countries as well. 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 He did not use tobacco or drugs, but did drink alcohol occasionally.  B. On  physical examination, his weight [name] 234 pounds, his height [name] 70 inches, his BMI [name] 33.2. Blood pressure [name] 128/70. Hemoglobin A1c [name] 9.5%. Heart rate [name] 96. He had normal affect and fair insight. He had a 25-30 gram goiter. He had normal 1+ DP pulses in his feet. He had normal sensation in his feet to touch, vibration, and monofilament. His CMP [name] normal except for glucose of 251. His cholesterol [name] 257, triglycerides 143, HDL 49, and LDL 179. TSH [name] 1.017, free T4 [name] 1.06, and free T3 [name] 3.3. His urinary microalbumin: creatinine ratio [name] 20.1 (normal less than 30).   C. The patient clearly needed better blood glucose control, which would only occur if he had better adherence to his plan. He appeared to have autonomic neuropathy, manifested by inappropriate sinus tachycardia.  His thyroid goiter suggested that he might have evolving Hashimoto's disease. He stated that he had lost some weight recently. I encouraged him to check his blood sugars more frequently and to take both correction boluses and food boluses.  2. 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 His hemoglobin A1c values have varied from 8.3-11.2%. Throughout the 12 year period, the patient's autonomic neuropathy and tachycardia have remained essentially the same. On 12/03/10 his total cholesterol [name] 270, triglycerides 94, HDL 50, and LDL 201. I started him on Crestor, 10 mg per day. He has since begun treatment with atorvastatin and his cholesterol values had improved. In March  2018 he [name] converted to a Medtronic 670G pump and Guardian 3 CGM sensor. Since then his BGs have been much better. Unfortunately, beginning in about June 2018 he has also had a series of episodes of bronchitis and sinusitis, muscle pains, and lethargy that presumably  were due in large part to severe allergies. He has been very sick for much of the past year.  His atorvastatin and several other medications were stopped at that time.    3. The patient's last [facility] visit [name] on 11/17/17. At that visit I increased his ICR at 11 AM by 20%. He then had more hypoglycemia after lunch more frequently, so he reduced the ICR at 11 AM by 0.5 units.   A. He is feeling much better. He has "not been sick for 6 weeks", but [name] outside this weekend and his allergic conjunctivitis and rhinitis are worse. He is now receiving allergy injections twice every week, but will transition to once a week this week. He is not sure if the allergy injections are helping him.     [name] has remained off his eye drops, trazodone, Wellbutrin, and atorvastatin since seeing Dr. Darrick Penna on 04/14/17.   C. He noted more postprandial abdominal bloating and constipation about 3 weeks ago. These symptoms lasted about two weeks, then resolved. He [name] not able to discern a cause for these symptoms. His nausea has decreased. His spinning dizziness and vertigo [name] a lot better until yesterday, when his allergy symptoms worsened. He still  has daily headaches that involve his trapezius areas posterior neck, and have a band-like distribution around his head from the occiput to his eyes.   D. He continues to have problems with lactose intolerance. When he avoids lactose, however, he is asymptomatic. Some foods such as shellfish cause his throat to swell up.   E. He is using Humalog aspart insulin in his insulin pump. He takes losartan, 50 mg/day. He still has some nocturnal cough.   F. He is still working from home. Due to his upper respiratory illness his hours have been cut back.   G. He had a sleep study on 06/18/17 that showed 1 episode of obstructive apnea per hour and 1.6 episodes of hypopnea per hour. These findings were not felt to be significant for OSA. However, based upon the moderate amount of snoring that he  did, a dental appliance [name] recommended. He has not pursued that recommendation yet.     4. Pertinent Review of Systems: Constitutional: [name] feels "better". Eyes: Last exam [name] in January 2019. Some growing vessels were seen. He has not had any further eye injections. His retinopathy seems to be under control. His next follow up visit will be on 01/25/18.     Neck: He has not had had any more anterior neck swelling and tenderness.  Heart: His heart rate has been elevated at times, not necessarily associated with nasal congestion and/or feeling sick. The patient has no complaints of palpitations, irregular heat beats, chest pain, or chest pressure.  Gastrointestinal: As above. He has less nausea. The patient has no other complaints of excessive hunger, heartburn, acid reflux, upset stomach, stomach aches or pains, or diarrhea. Legs: He occasionally feels vibration in his legs, more frequently in the lower left leg. Muscle mass and strength seem normal. There are no other complaints of numbness, tingling, burning, or pain. No edema is noted. Feet: There are no obvious foot problems now. There are no complaints of numbness, burning, or pain.  No edema is noted. Hypoglycemia: As above. He has not been having many low BGs since reducing his ICR at 11 AM.  Emotional: He is doing better now that he is feeling better. He had a follow up appointment with Dr. Piedad Climes in [place] in November 2018. Dr. Shane Crutch will wait until [name]'s overall health situation stabilizes before trying any new psych medications.     5. BG printout: We have data from the past 2 weeks. He changes sites every 1-4 days. He checks BGs 5-8 times daily. BGs varied from 84-221, compared with 87-292 at his last visit and with 57-306 at his prior visit. Average BG is 152, compared with 169 at his last visit and with 166 at his prior visit. His higher BGs occurred before lunch and before dinner.   6. CGM printout: Skin glucose values  and BG values correlate very well. Average SG [name] 143, compared with 148 at his last visit and with 143 at his prior visit  He [name] in auto mode 99% of the time. His highest SGs occurred around 1-3 PM and again from 7-10 PM. His lowest SGs occurred from 5 PM-8 PM, usually associated with eating later. He has had to call Medtronic 2-3 times per month about sensor problems.  [employer] has been replacing the sensors free of charge, but he has to call for each sensor problem.     PAST MEDICAL, FAMILY, AND SOCIAL HISTORY:  Past Medical History:  Diagnosis Date  . ADHD (attention deficit hyperactivity disorder)   . Autonomic neuropathy due to diabetes (HCC)   . Diabetes (HCC)   . Fatigue   . Goiter   . High blood pressure   . High cholesterol   . Hypercholesterolemia   . Hypertension   . Hypoglycemia associated with diabetes (HCC)   . Obesity   . Tachycardia   . Type 1 diabetes mellitus not at goal [facility])   . Uncontrolled DM with microalbuminuria or microproteinuria     Family History  Problem Relation Age of Onset  . Dementia Paternal Aunt   . Schizophrenia Paternal Aunt   . Cancer Maternal Grandmother   . Diabetes Maternal Grandmother        T2 DM  . Cancer Maternal Grandfather   . Thyroid disease Neg Hx      Current Outpatient Medications:  .  albuterol (PROAIR HFA) 108 (90 Base) MCG/ACT inhaler, Inhale 2 puffs into the lungs every 6 (six) hours as needed for wheezing or shortness of breath., Disp: 1 Inhaler, Rfl: 2 .  ARNUITY ELLIPTA 200 MCG/ACT AEPB, INL 1 PUFF ITL D, Disp: , Rfl: 5 .  AUVI-Q 0.3 MG/0.3ML SOAJ injection, Use as directed for life-threatening allergic reaction., Disp: 4 Device, Rfl: 2 .  clonazePAM (KLONOPIN) 0.5 MG tablet, Take 0.5 mg by mouth 2 (two) times daily as needed., Disp: , Rfl: 0 .  HUMALOG 100 UNIT/ML injection, INJECT 300 UNITS VIA INSULIN PUMP EVERY 48 TO 72 HOURS PER HYPERGLYCEMIA AND DKA PROTOCOLS., Disp: 40 mL, Rfl: 5 .  losartan (COZAAR) 50 MG  tablet, TAKE 1 TABLET(50 MG) BY MOUTH DAILY, Disp: 30 tablet, Rfl: 4 .  montelukast (SINGULAIR) 10 MG tablet, TAKE 1 TABLET(10 MG) BY MOUTH AT BEDTIME, Disp: 30 tablet, Rfl: 4 .  ONE TOUCH ULTRA TEST test strip, Check sugar 15 x daily, Disp: 450 each, Rfl: 3 .  ONE TOUCH ULTRA TEST test strip, USE AS DIRECTED SIX TIMES DAILY, Disp: 200 each, Rfl: 5 No current  facility-administered medications for this visit.   Facility-Administered Medications Ordered in Other Visits:  .  gadopentetate dimeglumine (MAGNEVIST) injection 20 mL, 20 mL, Intravenous, Once PRN, Dohmeier, Porfirio Mylar, MD  Allergies as of 01/13/2018 - Review Complete 01/13/2018  Allergen Reaction Noted  . Flonase [fluticasone] Shortness Of Breath 06/10/2017    1. Work and Family: He is now working at home at reduced hours. 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 He hopes to return to full-time work soon. 2. Activities: He has not done much physical activity this calendar year. He [name] more physically active when he felt good in November.    3. Smoking, alcohol, or drugs: Occasional beer, about one-two per month. No tobacco or drugs. 4. Primary Care Provider: Dr. Guerry Bruin, MD, [facility]  REVIEW OF SYSTEMS: There are no other significant problems involving Elimelech's other body systems.   Objective:  Vital Signs:  BP 132/78   Pulse 88   Ht 5' 11.5" (1.816 m)   Wt 260 lb (117.9 kg)   BMI 35.76 kg/m    Ht Readings from Last 3 Encounters:  01/13/18 5' 11.5" (1.816 m)  10/13/17 6' 3.2" (1.91 m)  08/31/17 6' (1.829 m)   Wt Readings from Last 3 Encounters:  01/13/18 260 lb (117.9 kg)  11/17/17 254 lb 3.2 oz (115.3 kg)  10/13/17 252 lb 6.4 oz (114.5 kg)   PHYSICAL EXAM:  Constitutional: The patient appears healthy and much better physically, but still obese. He looks quite good today, but still has some allergic conjunctivitis. His weight increased 6 pounds since his last visit,  equivalent to about an excess of 330 calories per day. His affect is normal. His insight is normal. He did not cough today. Face: The face appears normal.  Eyes: As above. There is no obvious arcus or proptosis. Moisture appears normal.  Mouth: The oropharynx and tongue appear normal. Oral moisture is normal. Neck: The neck appears to be visibly normal. No carotid bruits are noted. He has a short neck. His strap muscles are smaller. The thyroid gland is low lying and is a bit smaller at about 21 grams in size. Both lobes are mildly enlarged.  The thyroid gland is not tender to palpation today.  Lungs: The lungs are clear to auscultation. Air movement is good.  Heart: Heart rate and rhythm are regular. Heart sounds S1 and S2 are normal. I did not appreciate any pathologic cardiac murmurs. Abdomen: The abdomen is enlarged. Bowel sounds are normal. There is no obvious hepatomegaly, splenomegaly, or other mass effect. The abdomen is somewhat tender to deep palpation laterally in both sides. Arms: Muscle size and bulk are normal for age. Hands: There is no tremor of his hands today. Phalangeal and metacarpophalangeal joints are normal. Palmar muscles are normal. Palmar skin is normal. Palmar moisture is also normal. Legs: Muscles appear normal for age. No edema is present. Feet: 1+ right DP pulse, 1+ left DP pulse. Neurologic: Strength is normal for age in both the upper and lower extremities. Muscle tone is normal. Sensation to touch is normal in both legs and both feet.     LAB DATA:   Labs 01/13/18: HbA1c 6.8%, CBG 194  Labs 11/17/17: CBG 170  Labs 10/13/17: HbA1c 6.8%, CBG 191  Labs 08/24/17: CBG 117  Labs 07/20/17: CBG 216  Labs 07/08/17: IgA, IgG, and IgM normal. CBC normal.  Labs 06/16/17: HbA1c 6.5%, CBG 193  Labs 05/15/17: CBG 209; TSH 0.90, free  T4 1.2, free T3 3.8  Labs 03/05/17: HbA1c 7.3%, CBG 241  Labs 02/20/17: CBG 180 fasting  Labs 01/29/17: CBG 148  Labs 01/12/17: CBG  116  Labs 12/29/16: HbA1c 9.1%, CBG 223; TSH 0.62, free T4 1.1, free T3 3.5; urine microalbumin/creatinine ratio 22; cholesterol 171, triglycerides 93, HDL 40, LDL 112;  CMP glucose 201  Labs 09/26/16: HbA1c 9.5%  Labs 08/13/16: Celiac panel negative; CMP normal except for glucose 207; CBC normal  Labs 06/13/16: HbA1c 9.8%; LH 3.6, FSH 2.3, testosterone 533 (ref [id number]), free testosterone 59.4 (ref 47-244)  Labs 02/11/16: HbA1c 9.2%  Labs 01/07/16: HbA1c 10.5%; CBC with Hgb elevated at 17.2%; LH 3.9, FSH 2.0; TSH 1.53, free T4 1.3, free T3 3.7; cholesterol 262, triglycerides 171, HDL 40, LDL 188; urinary microalbumin/creatinine ratio 31; CMP normal except for glucose 181  Labs 06/29/15. HbA1c 9.6%  Labs 12/28/14: Hemoglobin A1c 9.5% today, compared with 9.5%, at last visit and with 9.9% at the prior visit.  Labs 08/03/14: HbA1c 9.5%; TSH 0.548,free T4 1.10, free T3 3.7, TPO antibody < 1, TSI 30; CMP normal except glucose 191; urinary microalbumin/creatinine ratio 27; cholesterol 260, triglycerides 90, HDL 51, LDL 191   Labs 10/05/13: HbA1c 9.9%  Labs 04/23/13: CMP normal, except glucose 144; cholesterol 216, triglycerides 97, HDL 39, LDL 158; urinary microalbumin/creatinine ratio 6.9; TSH 0.505, free T4 1.17, free T3 3.8  Labs: 01/21/12: TSH 1.251, free T4 1.10, free T3 3.6, CBC normal, CMP normal except for glucose 206, urinary microalbumin/creatinine ratio 6.3, cholesterol 187, triglycerides 67, HDL 45, LDL 129   Assessment and Plan:   ASSESSMENT:  1. T1DM: His BGs are doing quite well overall, but still vary with whether or not he has active allergic rhinitis/sinusitis/bronchitis and whether or not he feels well enough to exercise.   The combination of the Medtronic 670G pump and Guardian sensor are again helping him achieve good BG control.  2. Hypoglycemia: He has had one SG <70, but no BGs <84. His low SG at this visit occurred just after a sensor change.   3. Hypertension: SBP and DBP  are still somewhat high. He needs to continue to take meds regularly and to exercise regularly when he feels well enough to do so.  4. Combined hyperlipidemia: His lipids in March 2017 were too high, but the lipids in March 2018 were much better on atorvastatin. He discontinued the atorvastatin in mid-2018 due to concern that this medication might be causing his neuromuscular symptoms. We will plan to re-start the atorvastatin in the near future once he becomes stable on immunotherapy. We will repeat his lipid panel at that time.  5. Autonomic neuropathy with tachycardia and gastroparesis: His heart rate has normalized. I don't know what caused his abdominal bloating and constipation three weeks ago. He may just have had constipation. He needs to increase his fluid intake to at least 48 ounces per day.  6. Obesity: Weight has increased again in the past month.  He needs fewer calories and more exercise.      7-8. Goiter/thyroiditis:   A. His thyroid gland is slightly smaller today and his thyroiditis is clinically quiescent. The active thyroiditis that he has had in the past and the  process of waxing and waning of thyroid gland size are c/w evolving Hashimoto's thyroiditis.   B. He [name] euthyroid in March 2013, but [name] borderline hyperthyroid in June 2014. He [name] euthyroid in October 2015, March 2017, March 2018, and July 2018.  9. Fatigue: This  problem has significantly improved.  10. Obstructive sleep apnea: He did not meet the criteria for OSA, but because of his snoring a dental appliance [name] suggested. I recommended that he obtain a dental appliance. Unfortunately, his former dentist no longer accepts [employer], so [name] has to find anew dentist. 11. Adult ADD: He had an appointment with Dr. Piedad Climes, MD in [place] in November 2018. Dr. Shane Crutch is waiting for [name]'s medical situation to stabilize before beginning any new psych meds.   12. Proliferative diabetic retinopathy:  This problem is due to his long period of poorly controlled BGs. It will take at least 6-12 months of near-normal BGs for his retinopathy to stabilize.  He will have follow up exam soon.  13. URI/nasal congestion/vertigo/allergic rhinitis and conjunctivitis: He has some new allergic rhinitis and conjunctivitis. He is now on immunotherapy.    14. Neuromuscular symptoms: Dr. Vickey Huger did not find any signs or any serious neurologic disease. His MRI of his brain [name] unremarkable. Fortunately, his neuromuscular symptoms have resolved.  15. Calcified lymph node: It is possible that he may have contracted a fungus while he [name] working in the [employer] several years ago. His thyroid US showed his goiter, but no intrinsic thyroid abnormality. A partially calcified cervical lymph node on the right [name] noted, c/w a post infections/inflammatory node.  16. Headaches: He appears to have tension HAs. I showed him some cervical stretching exercises.   PLAN:  1. Diagnostic:  Repeat HbA1c and CBG  today. Annual labs today, except for the lipid panel. Call in two weeks on a Sunday or Wednesday evening to discuss BGs.  2. Therapeutic: Try Breathe Tight nasal strips. Follow DM care plan. Continue losartan. Continue his current pump basal rates, ISFs, and BG targets.   Continue current ICRs and other pump settings:  MN: 7 8 AM: 5.00 11 AM: 4.5 9 PM: 7 3. Patient education: We discussed the significant improvements in his health since his last visit. We discussed the need to make small changes in his ICRs based upon his eating habits and exercise habits. We discussed cervical stretching exercises. We also discussed the need for him to continue to be followed closely by Drs Lucie Leather and Shane Crutch.  4. Follow-up:  2 months  Level of Service: This visit lasted in excess of 65 minutes. More than 50% of the visit [name] devoted to counseling.  Molli Knock, MD, CDE Adult and Pediatric  Endocrinology  01/13/2018 10:25 AM

## 2018-01-14 ENCOUNTER — Ambulatory Visit: Payer: Self-pay

## 2018-01-14 LAB — COMPREHENSIVE METABOLIC PANEL
AG RATIO: 1.7 (calc) (ref 1.0–2.5)
ALBUMIN MSPROF: 4.3 g/dL (ref 3.6–5.1)
ALKALINE PHOSPHATASE (APISO): 68 U/L (ref 40–115)
ALT: 24 U/L (ref 9–46)
AST: 17 U/L (ref 10–40)
BILIRUBIN TOTAL: 0.5 mg/dL (ref 0.2–1.2)
BUN: 12 mg/dL (ref 7–25)
CALCIUM: 9.5 mg/dL (ref 8.6–10.3)
CHLORIDE: 102 mmol/L (ref 98–110)
CO2: 30 mmol/L (ref 20–32)
Creat: 0.83 mg/dL (ref 0.60–1.35)
GLOBULIN: 2.5 g/dL (ref 1.9–3.7)
Glucose, Bld: 191 mg/dL — ABNORMAL HIGH (ref 65–99)
POTASSIUM: 4.4 mmol/L (ref 3.5–5.3)
Sodium: 138 mmol/L (ref 135–146)
Total Protein: 6.8 g/dL (ref 6.1–8.1)

## 2018-01-14 LAB — T3, FREE: T3 FREE: 3.6 pg/mL (ref 2.3–4.2)

## 2018-01-14 LAB — T4, FREE: FREE T4: 1.3 ng/dL (ref 0.8–1.8)

## 2018-01-14 LAB — TSH: TSH: 0.98 mIU/L (ref 0.40–4.50)

## 2018-01-14 LAB — MICROALBUMIN / CREATININE URINE RATIO
CREATININE, URINE: 226 mg/dL (ref 20–320)
MICROALB UR: 7.9 mg/dL
Microalb Creat Ratio: 35 mcg/mg creat — ABNORMAL HIGH (ref <30)

## 2018-01-15 ENCOUNTER — Telehealth (INDEPENDENT_AMBULATORY_CARE_PROVIDER_SITE_OTHER): Payer: Self-pay

## 2018-01-15 NOTE — Telephone Encounter (Addendum)
Call to [name] ----- Message from David StallMichael J Brennan, MD sent at 01/14/2018 11:08 PM EDT ----- Thyroid tests were normal. CMP was normal except for glucose of 191. Urine microalbumin protein was a bit elevated. Good blood glucose control will help.

## 2018-01-19 ENCOUNTER — Ambulatory Visit (INDEPENDENT_AMBULATORY_CARE_PROVIDER_SITE_OTHER): Payer: 59 | Admitting: *Deleted

## 2018-01-19 DIAGNOSIS — J309 Allergic rhinitis, unspecified: Secondary | ICD-10-CM

## 2018-01-26 ENCOUNTER — Ambulatory Visit (INDEPENDENT_AMBULATORY_CARE_PROVIDER_SITE_OTHER): Payer: 59 | Admitting: *Deleted

## 2018-01-26 DIAGNOSIS — J309 Allergic rhinitis, unspecified: Secondary | ICD-10-CM | POA: Diagnosis not present

## 2018-02-04 ENCOUNTER — Ambulatory Visit (INDEPENDENT_AMBULATORY_CARE_PROVIDER_SITE_OTHER): Payer: 59 | Admitting: *Deleted

## 2018-02-04 DIAGNOSIS — J309 Allergic rhinitis, unspecified: Secondary | ICD-10-CM | POA: Diagnosis not present

## 2018-02-09 ENCOUNTER — Other Ambulatory Visit: Payer: Self-pay | Admitting: Internal Medicine

## 2018-02-09 DIAGNOSIS — N419 Inflammatory disease of prostate, unspecified: Secondary | ICD-10-CM

## 2018-02-09 DIAGNOSIS — R300 Dysuria: Secondary | ICD-10-CM

## 2018-02-10 ENCOUNTER — Encounter: Payer: Self-pay | Admitting: *Deleted

## 2018-02-10 ENCOUNTER — Ambulatory Visit
Admission: RE | Admit: 2018-02-10 | Discharge: 2018-02-10 | Disposition: A | Discharge status: Home / Self Care | Payer: 59 | Source: Ambulatory Visit | Attending: Internal Medicine | Admitting: Internal Medicine

## 2018-02-10 DIAGNOSIS — N419 Inflammatory disease of prostate, unspecified: Secondary | ICD-10-CM

## 2018-02-10 DIAGNOSIS — R300 Dysuria: Secondary | ICD-10-CM

## 2018-02-10 NOTE — Progress Notes (Signed)
Maintenance vial made. Exp: 02-11-19. hv 

## 2018-02-11 ENCOUNTER — Ambulatory Visit (INDEPENDENT_AMBULATORY_CARE_PROVIDER_SITE_OTHER): Payer: 59 | Admitting: *Deleted

## 2018-02-11 DIAGNOSIS — J309 Allergic rhinitis, unspecified: Secondary | ICD-10-CM | POA: Diagnosis not present

## 2018-02-15 ENCOUNTER — Ambulatory Visit (INDEPENDENT_AMBULATORY_CARE_PROVIDER_SITE_OTHER): Payer: 59 | Admitting: *Deleted

## 2018-02-15 DIAGNOSIS — J309 Allergic rhinitis, unspecified: Secondary | ICD-10-CM

## 2018-02-17 DIAGNOSIS — J3089 Other allergic rhinitis: Secondary | ICD-10-CM | POA: Diagnosis not present

## 2018-02-23 ENCOUNTER — Ambulatory Visit (INDEPENDENT_AMBULATORY_CARE_PROVIDER_SITE_OTHER): Payer: 59 | Admitting: *Deleted

## 2018-02-23 DIAGNOSIS — J309 Allergic rhinitis, unspecified: Secondary | ICD-10-CM

## 2018-03-02 ENCOUNTER — Ambulatory Visit (INDEPENDENT_AMBULATORY_CARE_PROVIDER_SITE_OTHER): Payer: 59 | Admitting: *Deleted

## 2018-03-02 DIAGNOSIS — J309 Allergic rhinitis, unspecified: Secondary | ICD-10-CM | POA: Diagnosis not present

## 2018-03-08 ENCOUNTER — Telehealth: Payer: Self-pay | Admitting: Neurology

## 2018-03-08 NOTE — Telephone Encounter (Signed)
Called the pt back. He sees a diabetic MD and has required several surgeries. The patient eye MD has questioned if the pt could be developing fluid build up behind the eye causing his problems. Pt states that he was having some vision loss in right eye. He is scheduled for surgery on Thursday but the  MD was hoping he could be evaluated for this prior to the surgery. I have placed pt in on a opening tomorrow at 10:30 am with arrival time of 10 am. Pt verbalized understanding

## 2018-03-08 NOTE — Telephone Encounter (Signed)
Patient would like call back regarding head pressure and he has vision loss in right eye. Eye doc wants him to have a follow up before surgery. If he cannot be seen before Thursday can he be seen by someone else - Please call [phone]

## 2018-03-09 ENCOUNTER — Other Ambulatory Visit: Payer: Self-pay | Admitting: Neurology

## 2018-03-09 ENCOUNTER — Encounter: Payer: Self-pay | Admitting: Neurology

## 2018-03-09 ENCOUNTER — Telehealth: Payer: Self-pay | Admitting: Neurology

## 2018-03-09 ENCOUNTER — Ambulatory Visit: Payer: 59 | Admitting: Neurology

## 2018-03-09 ENCOUNTER — Ambulatory Visit (HOSPITAL_COMMUNITY)
Admission: RE | Admit: 2018-03-09 | Discharge: 2018-03-09 | Disposition: A | Discharge status: Home / Self Care | Payer: 59 | Source: Ambulatory Visit | Attending: Neurology | Admitting: Neurology

## 2018-03-09 VITALS — BP 126/85 | HR 91 | Ht 72.0 in | Wt 266.0 lb

## 2018-03-09 DIAGNOSIS — H4711 Papilledema associated with increased intracranial pressure: Secondary | ICD-10-CM

## 2018-03-09 DIAGNOSIS — H4713 Papilledema associated with retinal disorder: Secondary | ICD-10-CM

## 2018-03-09 DIAGNOSIS — E133519 Other specified diabetes mellitus with proliferative diabetic retinopathy with macular edema, unspecified eye: Secondary | ICD-10-CM | POA: Diagnosis not present

## 2018-03-09 DIAGNOSIS — E1039 Type 1 diabetes mellitus with other diabetic ophthalmic complication: Secondary | ICD-10-CM

## 2018-03-09 LAB — CSF CELL COUNT WITH DIFFERENTIAL
RBC Count, CSF: 0 /mm3
Tube #: 3
WBC CSF: 1 /mm3 (ref 0–5)

## 2018-03-09 LAB — GLUCOSE, CSF: GLUCOSE CSF: 95 mg/dL — AB (ref 40–70)

## 2018-03-09 LAB — PROTEIN, CSF: Total  Protein, CSF: 17 mg/dL (ref 15–45)

## 2018-03-09 LAB — GLUCOSE, CAPILLARY: Glucose-Capillary: 97 mg/dL (ref 65–99)

## 2018-03-09 MED ORDER — LIDOCAINE HCL (PF) 1 % IJ SOLN
INTRAMUSCULAR | Status: AC
  Administered 2018-03-09: 10 mL via INTRADERMAL
  Filled 2018-03-09: qty 10

## 2018-03-09 MED ORDER — LIDOCAINE HCL (PF) 1 % IJ SOLN
10.0000 mL | Freq: Once | INTRAMUSCULAR | Status: AC
  Administered 2018-03-09: 10 mL via INTRADERMAL

## 2018-03-09 MED ORDER — ACETAZOLAMIDE ER 500 MG PO CP12: 500.0000 mg | ORAL_CAPSULE | Freq: Two times a day (BID) | ORAL | 5 refills | Status: DC

## 2018-03-09 NOTE — Patient Instructions (Signed)
Lumbar Puncture  A lumbar puncture, or spinal tap, is a procedure in which a small amount of the fluid that surrounds the brain and spinal cord is removed and examined. The fluid is called the cerebrospinal fluid. This procedure may be done to:   Help diagnose various problems, such as meningitis, encephalitis, multiple sclerosis, and AIDS.   Remove fluid and relieve pressure that occurs with certain types of headaches.   Look for bleeding within the brain and spinal cord areas (central nervous system).   Place medicine into the spinal fluid.    Tell a health care provider about:   Any allergies you have.   All medicines you are taking, including vitamins, herbs, eye drops, creams, and over-the-counter medicines.   Any problems you or family members have had with anesthetic medicines.   Any blood disorders you have.   Any surgeries you have had.   Any medical conditions you have.  What are the risks?  Generally, this is a safe procedure. However, as with any procedure, complications can occur. Possible complications include:   Spinal headache. This is a severe headache that occurs when there is a leak of spinal fluid. A spinal headache causes discomfort but is not dangerous. If it persists, another procedure may be done to treat the headache.   Bleeding. This most often occurs in people with bleeding disorders. These are disorders in which the blood does not clot normally.   Infection at the insertion site that can spread to the bone or spinal fluid.   Formation of a spinal cord tumor (rare).   Brain herniation or movement of the brain into the spinal cord (rare).   Inability to move (extremely rare).    What happens before the procedure?   You may have blood tests done. These tests can help tell how well your kidneys and liver are working. They can also show how well your blood clots.   If you take blood thinners (anticoagulant medicine), ask your health care provider if and when you should stop  taking them.   Your health care provider may order a CT scan of your brain.   Make arrangements for someone to drive you home after the procedure.  What happens during the procedure?   You will be positioned so that the spaces between the bones of the spine (vertebrae) are as wide as possible. This will make it easier to pass the needle into the spinal canal.   Depending on your age and size, you may lie on your side, curled up with your knees under your chin. Or, you may sit with your head resting on a pillow that is placed at waist level.   The skin covering the lower back (or lumbar region) will be cleaned.   The skin may be numbed with medicine.   You may be given pain medicine or a medicine to help you relax (sedative).   A small needle will be inserted in the skin until it enters the space that contains the spinal fluid. The needle will not enter the spinal cord.   The spinal fluid will be collected into tubes.   The needle will be withdrawn, and a bandage will be placed on the site.  What happens after the procedure?   You will remain lying down for 1 hour or for as long as your health care provider suggests.   The spinal fluid will be sent to a laboratory to be examined. The results of the examination may   be available before you go home.   A test, called a culture, may be taken of the spinal fluid if your health care provider thinks you have an infection. If cultures were taken for exam, the results will usually be available in a couple of days.  This information is not intended to replace advice given to you by your health care provider. Make sure you discuss any questions you have with your health care provider.  Document Released: 10/10/2000 Document Revised: 03/20/2016 Document Reviewed: 06/20/2013  Elsevier Interactive Patient Education  2017 Elsevier Inc.

## 2018-03-09 NOTE — Telephone Encounter (Signed)
The CT was not needed they did a LP.. But I still had the CT head approved. [employer] Auth: [id number] (exp. 03/09/18 to 04/23/18) for [facility].

## 2018-03-09 NOTE — Progress Notes (Signed)
SLEEP MEDICINE CLINIC   Provider:  Melvyn Novas, M D  Primary Care Physician:  Gaspar Garbe, MD   Referring Provider: ophthalmologist Dr. Luciana Axe, who is also a retina specialist.  The patient is further followed by endocrinologist Dr. Molli Knock since he is diabetic type I.   Chief Complaint  Patient presents with  . Follow-up    pt alone, rm 11. pt states that he has lossed partial vision in the right eye. he is planning to undergo laser eye surgery on thursday. the eye MD recommended he come in and be evaluated to address swelling behind the eye and if that needed to be removed. they were hoping if so this could happen before his surgery on thur.     HPI:  03-09-2018, Patient returned for an urgent visit based on vision changes and diabetic changes to the eye, seen by Dr. Luciana Axe.  He developed headaches , changes to green tinged vision, some tremor and HA are worse when he in bed, better when he rises. Dynamic intracranial pressure changes He gets nauseated, and worse vision changes when moving. He feels a vibration and tension in the neck and pressure behind the right eye more than left, he sleeps on his right. History of chronic sinusitis, with tinnitus since 10/ 2017 . He tested negative for OSA. He had initially improved in HA, tinnitus and Vertigo. Tremor could not be objectively seen.  He has been a diabetic Type 1, followed by Dr.  Molli Knock, with an insulin pump.  MRI brain and sinus CT in the last 6 month. Seen by ENT and allergy specialist.  His upper respiratory infects have decreased, since allergy treatment.   Topical vancomycin and extensive notes over, the last visit with the patient was on May 9 Thursday 2019, the patient had been treated with Avastin injections into the left and right eye, beginning in August 2018 the last right eye injection was on November 09, 2017.  Patient is currently on doxycycline, Arnuity, Humalog insulin, Klonopin, losartan and  montelukast.  Impression was that the patient has controlled diabetes type 1 diabetes mellitus with right eye affected by proliferative retinopathy and macular edema Dr. Luciana Axe documented that he felt this was worsening.  He also stated the same for the left eye also to a slightly lesser degree.  There is also a minor cataract forming in both eyes, there is optic papillitis which has not been graded, ocular pain, and a history of acute sinusitis and retinal hemorrhage he mentions the patient snores and has headaches with valsalva-.  Transient obscuration of vision in both eyes- last  MRI scan was normal at the time/ 2018.   Headaches much worse when bending over, body habitus and doxycycline medication may cause this patient's likely intracranial pressure elevation.  ENT cleared him of sinusitis: CLINICAL DATA:  Chronic sinusitis  EXAM: CT MAXILLOFACIAL WITHOUT CONTRAST  TECHNIQUE: Multidetector CT images of the paranasal sinuses were obtained using the standard protocol without intravenous contrast.  COMPARISON:  CT sinus 04/24/2017  FINDINGS: Paranasal sinuses:  Frontal: Normally aerated. Patent frontal sinus drainage pathways.  Ethmoid: Normally aerated.  Maxillary: Mild mucosal edema in the maxillary sinus bilaterally. Mild improvement on the left.  Sphenoid: Normally aerated. Patent sphenoethmoidal recesses.  Mastoid:  Well aerated and clear  Right ostiomeatal unit: Patent.  Left ostiomeatal unit: Patent.  Nasal passages: Patent. Intact nasal septum is midline.  Other: Limited intracranial imaging negative. Normal orbital structures.  IMPRESSION: Mild mucosal edema in  the maxillary sinus bilaterally, with mild improvement on the left since 04/24/2017. Remaining sinuses clear. No air-fluid levels.   Electronically Signed   By: Marlan Palau M.D.   On: 11/02/2017 11:32  08-31-2017, Mr. Dmarcus Rung is seen here today for a follow-up visit after his sleep  study from 06/18/2017.  The patient again had such a mild degree of apnea that it does not need to be  treated with any intervention.  His AHI was only 2.6/h not considered pathologically.  His REM sleep AHI was 14.3/h and supine AHI 3.4.  We meet today to also look at his sleep experience since the study.  The patient reports that he still wakes up with trembling in both arms, he has shoulder discomfort which keeps him from sleeping on his side.  However in intervention with CPAP, PLM medication or oxygen is not necessary at this point.  I do not have a good understanding as to why he trembles, and why these trembling spells wake him from sleep.  He has been followed closely for his thyroid function and reports that it has been in the high normal range.  He still reports some palpitations that he experiences at night.  He also reports facial twitching at night. He related the onset to psychiatric medication ( wellbutrin, trazodone). He has year around asthma and allergies.  I have no interventional therapies available.    CONSULT : SLEEP CLINIC:  Xzavier Swinger is a 37 y.o. male , seen here as in a referral/ revisit  from sports medicine and Dr Fransico Michael, ped endocrinologist.  I would like to thank Dr. Fransico Michael for his report notes which helped me today to design further sleep evaluation and treatment for this patient. He also gave me an excellent summary of the past medical problems that Mr. Janes has faced. Mr.  Flinchum was diagnosed with diabetes at age 37 and has been followed by a pediatric endocrinologist, Dr. Fransico Michael. His blood sugars have been better and more stable on an auto mode Medtronic insulin pump. He also faces problems with obesity, hypertension, hyperlipidemia and an autonomic neuropathy with tachycardia and gastroparesis. As blood pressure control had improved so did his heart rate and his postprandial bloating. He has been treated for thyroiditis consistent with an evolving Hashimoto's  ultra immune thyroid disease. He was euthyroid again in March 2018 when last checked he had reported fatigue and was diagnosed by a home sleep test last year at " [facility]", was  supposedly diagnosed with obstructive sleep apnea but had not treatment initiated .  He has proliferative retinopathy, attributed to diabetes. Dr. Fransico Michael also wanted to make sure that current rather newish neuromuscular symptoms beI evaluated -he was especially concerned that this patient is prone to further auto-immune diseases such as central demyelination or multiple sclerosis. Dr. Darrick Penna in this sport medicine section of [facility] had added in this visit on 04/15/2007 but the patient habits and improvement of nausea, vertigo, after discontinuing eyedrops, trazodone and Wellbutrin and atorvastatin. He had more headaches. He felt jittery and tremors of his right hand and right leg were temporary worse. He describes photophobia. He felt shaking more in the morning l right hand and right leg, an inner jitteriness. It has improved. Vertigo not present today.    Sleep habits are as follows:  " Bedtime between 10 and 12 midnight, rises at 7 AM. He describes his sleep is rather restless, trying to find a comfortable sleep position, but she doesn't necessarily use the  bed during the night. He does not report nocturia, and only twice or so in the last month has he woke up with palpitations or clamminess, likely related to hypoglycemia. He is known to snore, and he had a home sleep test at [facility] last year in [facility] and was told that he has obstructive sleep apnea. Treatment was not initiated yet. He wakes up not feeling restored or refreshed, and he has noticed that his jittery illness affecting the right extremities and a feeling of inner trembling of his torso is the worst in the morning hours. It doesn't last that long. He also has a pulsating change of vision when he first gets up and he suffered from vertigo for  much of the last 3 months. No daytime naps, he wouldn't sleep at night if he did.   Sleep medical history and family sleep history:  Dad snoring, mom is an insomniac. He remembers having problems sleeping even in childhood. He has felt fatigued and not restored for several months. Social history: works as Games developer. The patient is single, no children. No tobacco use, seldom alcohol use,  4 months ago he stopped drinking caffeine due to the jitteriness, and headaches with photophobia and a feeling of the head being full could also be relating to sinusitis.    Review of Systems: Out of a complete 14 system review, the patient complains of only the following symptoms, and all other reviewed systems are negative.  Patient has shoulder pain and trembling in both arms, feels tight, achy. Not shooting pains, not electric sensation. Soreness   Epworth score 5, Fatigue severity score 30 , depression score n/a    Social History   Socioeconomic History  . Marital status: Single    Spouse name: Not on file  . Number of children: Not on file  . Years of education: Not on file  . Highest education level: Not on file  Occupational History  . Not on file  Social Needs  . Financial resource strain: Not on file  . Food insecurity:    Worry: Not on file    Inability: Not on file  . Transportation needs:    Medical: Not on file    Non-medical: Not on file  Tobacco Use  . Smoking status: Never Smoker  . Smokeless tobacco: Never Used  Substance and Sexual Activity  . Alcohol use: No  . Drug use: No  . Sexual activity: Not on file  Lifestyle  . Physical activity:    Days per week: Not on file    Minutes per session: Not on file  . Stress: Not on file  Relationships  . Social connections:    Talks on phone: Not on file    Gets together: Not on file    Attends religious service: Not on file    Active member of club or organization: Not on file    Attends meetings of clubs or  organizations: Not on file    Relationship status: Not on file  . Intimate partner violence:    Fear of current or ex partner: Not on file    Emotionally abused: Not on file    Physically abused: Not on file    Forced sexual activity: Not on file  Other Topics Concern  . Not on file  Social History Narrative   ** Merged History Encounter **        Family History  Problem Relation Age of Onset  . Dementia Paternal  Aunt   . Schizophrenia Paternal Aunt   . Cancer Maternal Grandmother   . Diabetes Maternal Grandmother        T2 DM  . Cancer Maternal Grandfather   . Thyroid disease Neg Hx     Past Medical History:  Diagnosis Date  . ADHD (attention deficit hyperactivity disorder)   . Autonomic neuropathy due to diabetes (HCC)   . Diabetes (HCC)   . Fatigue   . Goiter   . High blood pressure   . High cholesterol   . Hypercholesterolemia   . Hypertension   . Hypoglycemia associated with diabetes (HCC)   . Obesity   . Tachycardia   . Type 1 diabetes mellitus not at goal [facility])   . Uncontrolled DM with microalbuminuria or microproteinuria     Past Surgical History:  Procedure Laterality Date  . none    . WISDOM TOOTH EXTRACTION      Current Outpatient Medications  Medication Sig Dispense Refill  . albuterol (PROAIR HFA) 108 (90 Base) MCG/ACT inhaler Inhale 2 puffs into the lungs every 6 (six) hours as needed for wheezing or shortness of breath. 1 Inhaler 2  . ARNUITY ELLIPTA 200 MCG/ACT AEPB INL 1 PUFF ITL D  5  . AUVI-Q 0.3 MG/0.3ML SOAJ injection Use as directed for life-threatening allergic reaction. 4 Device 2  . clonazePAM (KLONOPIN) 0.5 MG tablet Take 0.5 mg by mouth 2 (two) times daily as needed.  0  . doxycycline (VIBRAMYCIN) 100 MG capsule TK 1 C PO BID  1  . HUMALOG 100 UNIT/ML injection INJECT 300 UNITS VIA INSULIN PUMP EVERY 48 TO 72 HOURS PER HYPERGLYCEMIA AND DKA PROTOCOLS. 40 mL 5  . losartan (COZAAR) 50 MG tablet TAKE 1 TABLET(50 MG) BY MOUTH DAILY 30  tablet 4  . montelukast (SINGULAIR) 10 MG tablet TAKE 1 TABLET(10 MG) BY MOUTH AT BEDTIME 30 tablet 4  . ONE TOUCH ULTRA TEST test strip Check sugar 15 x daily 450 each 3  . ONE TOUCH ULTRA TEST test strip USE AS DIRECTED SIX TIMES DAILY 200 each 5   No current facility-administered medications for this visit.    Facility-Administered Medications Ordered in Other Visits  Medication Dose Route Frequency Provider Last Rate Last Dose  . gadopentetate dimeglumine (MAGNEVIST) injection 20 mL  20 mL Intravenous Once PRN Colen Eltzroth, Porfirio Mylar, MD        Allergies as of 03/09/2018 - Review Complete 03/09/2018  Allergen Reaction Noted  . Flonase [fluticasone] Shortness Of Breath 06/10/2017  . Sulfites Itching and Swelling 02/04/2018  . Molds & smuts  03/09/2018    Vitals: BP 126/85   Pulse 91   Ht 6' (1.829 m)   Wt 266 lb (120.7 kg)   BMI 36.08 kg/m  Last Weight:  Wt Readings from Last 1 Encounters:  03/09/18 266 lb (120.7 kg)   ZOX:WRUE mass index is 36.08 kg/m.     Last Height:   Ht Readings from Last 1 Encounters:  03/09/18 6' (1.829 m)    Physical exam:  General: The patient is awake, alert and appears not in acute distress. The patient is well groomed. Head: Normocephalic, atraumatic. Neck is supple. Mallampati 5-  ,  neck circumference:21. Nasal airflow patent , TMJ is  Not  evident . Retrognathia is not seen. Full facial hair.  Cardiovascular:  Regular rate and rhythm , without  murmurs or carotid bruit, and without distended neck veins. Respiratory: Lungs are clear to auscultation. Skin:  Without evidence of edema,  or rash Trunk: BMI is elevated  The patient's posture is erect   Neurologic exam : The patient is awake and alert, oriented to place and time.   Memory subjective described as intact.  Attention span & concentration ability appears normal.  Speech is fluent,  without dysarthria, dysphonia or aphasia.  Mood and affect are appropriate.  Cranial nerves: Pupils  are equal and briskly reactive to light.  Pale retinae bilaterally, yellow tinged - has neovascular proliferation in both eyes, and a cupping of the optic papillae more right than left. Laser surgical scars visible.  Extraocular movements in vertical and horizontal planes intact and without nystagmus.  Hearing to finger rub intact.   Facial sensation intact to fine touch. Facial motor strength is symmetric and tongue and uvula move midline. Shoulder shrug was symmetrical.  Motor exam:  Normal tone, muscle bulk and symmetric strength in all extremities. He has less tension over the neck, worked with PT.  Sensory:  Fine touch, pinprick and vibration/. Proprioception tested in the upper extremities was normal. Coordination: Rapid alternating movements in the fingers/hands was normal. Finger-to-nose maneuver  normal without evidence of ataxia, dysmetria or tremor.  NO TREMOR seen here, today .Gait and station: Patient walks without assistive device , but reports dizziness with postural changes .  Strength within normal limits. Stance is stable and normal.   Deep tendon reflexes: in the upper and lower extremities are symmetric and intact. Babinski maneuver response is  downgoing.  Assessment:  After physical and neurologic examination, review of laboratory studies,  Personal review of imaging studies, reports of other /same  Imaging studies, results of polysomnography and / or neurophysiology testing and pre-existing records as far as provided in visit., my assessment is   1) possible intracranial hypertension in a young diabetic on tetracycline ( one of the most ICP producing medications) . Needs to undergo manometric LP.  2) MRI brain was normal 8 month ago -. Need to repeat for safe LP.  3) his sleep evaluation has been completed, no apnea, mild PLMs and no hypoxemia. Snoring was mild- moderate.   The patient was advised of the nature of the diagnosed disorder , the treatment options and the  risks  for general health and wellness arising from not treating the condition.I spent more than 30 minutes of face to face time with the patient.  Greater than 50% of time was spent in counseling and coordination of care. We have discussed the diagnosis and differential and I answered the patient's questions.    Plan:  Treatment plan and additional workup :  Note to Dr Luciana Axe.   I will order urgent CT head and follow up lumbar puncture with [facility] or at hospital .  D/c doxycycline. His UTI may need another ATB- he is allergic to Sulfa drugs ( Bactrim ) . Will ask Urology to which drug he can change - Urologist is Dr. Annabell Howells , [facility].   Dr Fransico Michael, May have to start diamox depending on outcome of LP.- this can change the Ph and in some diabetics the reading of their insulin test strips.    Follow up next Friday- 17th May  AM.     Melvyn Novas, MD 03/09/2018, 10:45 AM  Certified in Neurology by ABP[employer] Certified in Sleep Medicine by [facility] [address], [address] [place], [place] [address]

## 2018-03-09 NOTE — Discharge Instructions (Signed)
Lumbar Puncture, Care After Refer to this sheet in the next few weeks. These instructions provide you with information on caring for yourself after your procedure. Your health care provider may also give you more specific instructions. Your treatment has been planned according to current medical practices, but problems sometimes occur. Call your health care provider if you have any problems or questions after your procedure. What can I expect after the procedure? After your procedure, it is typical to have the following sensations:  Mild discomfort or pain at the insertion site.  Mild headache that is relieved with pain medicines.  Follow these instructions at home:   Avoid lifting anything heavier than 10 lb (4.5 kg) for at least 12 hours after the procedure.  Drink enough fluids to keep your urine clear or pale yellow.  Lay flat as much as possible for 24 hours after procedure. Contact a health care provider if:  You have fever or chills.  You have nausea or vomiting.  You have a headache that lasts for more than 2 days. Get help right away if:  You have any numbness or tingling in your legs.  You are unable to control your bowel or bladder.  You have bleeding or swelling in your back at the insertion site.  You are dizzy or faint. This information is not intended to replace advice given to you by your health care provider. Make sure you discuss any questions you have with your health care provider. Document Released: 10/18/2013 Document Revised: 03/20/2016 Document Reviewed: 06/21/2013 Elsevier Interactive Patient Education  2017 [employer].

## 2018-03-09 NOTE — Telephone Encounter (Signed)
Left message on patient's mobile VM- LP results per Dr Bradly Chris, Opening pressure was 28 cm water- this is high and verifies ICP elevation, pseudotumor. . Needs to stop tetracycline and start diamox, 500 mg bid po. Has an appointment on Friday , 3 days from now with me. Renminded him s to stay in bed, drink fluids and call Urologist for change in ATB and that I will send message to Dr Fransico Michael for possibility of   Diamox interference with blood glucose reading.   Melvyn Novas, MD

## 2018-03-10 ENCOUNTER — Ambulatory Visit: Payer: 59 | Admitting: Family Medicine

## 2018-03-10 ENCOUNTER — Emergency Department (HOSPITAL_BASED_OUTPATIENT_CLINIC_OR_DEPARTMENT_OTHER): Payer: 59

## 2018-03-10 ENCOUNTER — Emergency Department (HOSPITAL_BASED_OUTPATIENT_CLINIC_OR_DEPARTMENT_OTHER)
Admission: EM | Admit: 2018-03-10 | Discharge: 2018-03-10 | Disposition: A | Discharge status: Home / Self Care | Payer: 59 | Attending: Physician Assistant | Admitting: Physician Assistant

## 2018-03-10 ENCOUNTER — Encounter (HOSPITAL_BASED_OUTPATIENT_CLINIC_OR_DEPARTMENT_OTHER): Payer: Self-pay

## 2018-03-10 ENCOUNTER — Encounter: Payer: Self-pay | Admitting: Family Medicine

## 2018-03-10 ENCOUNTER — Other Ambulatory Visit: Payer: Self-pay

## 2018-03-10 ENCOUNTER — Telehealth: Payer: Self-pay | Admitting: Neurology

## 2018-03-10 DIAGNOSIS — M545 Low back pain, unspecified: Secondary | ICD-10-CM | POA: Insufficient documentation

## 2018-03-10 DIAGNOSIS — M791 Myalgia, unspecified site: Secondary | ICD-10-CM | POA: Insufficient documentation

## 2018-03-10 DIAGNOSIS — I10 Essential (primary) hypertension: Secondary | ICD-10-CM | POA: Diagnosis not present

## 2018-03-10 DIAGNOSIS — Z794 Long term (current) use of insulin: Secondary | ICD-10-CM | POA: Diagnosis not present

## 2018-03-10 DIAGNOSIS — R109 Unspecified abdominal pain: Secondary | ICD-10-CM | POA: Diagnosis present

## 2018-03-10 DIAGNOSIS — E109 Type 1 diabetes mellitus without complications: Secondary | ICD-10-CM | POA: Insufficient documentation

## 2018-03-10 LAB — COMPREHENSIVE METABOLIC PANEL
ALBUMIN: 4.1 g/dL (ref 3.5–5.0)
ALT: 25 U/L (ref 17–63)
AST: 19 U/L (ref 15–41)
Alkaline Phosphatase: 71 U/L (ref 38–126)
Anion gap: 10 (ref 5–15)
BILIRUBIN TOTAL: 1 mg/dL (ref 0.3–1.2)
BUN: 10 mg/dL (ref 6–20)
CHLORIDE: 103 mmol/L (ref 101–111)
CO2: 23 mmol/L (ref 22–32)
CREATININE: 0.73 mg/dL (ref 0.61–1.24)
Calcium: 8.7 mg/dL — ABNORMAL LOW (ref 8.9–10.3)
GFR calc Af Amer: >60 mL/min (ref >60)
GLUCOSE: 175 mg/dL — AB (ref 65–99)
Potassium: 4.1 mmol/L (ref 3.5–5.1)
Sodium: 136 mmol/L (ref 135–145)
Total Protein: 7.3 g/dL (ref 6.5–8.1)

## 2018-03-10 LAB — CBC WITH DIFFERENTIAL/PLATELET
BASOS ABS: 0 10*3/uL (ref 0.0–0.1)
Basophils Relative: 0 %
Eosinophils Absolute: 0 10*3/uL (ref 0.0–0.7)
Eosinophils Relative: 0 %
HEMATOCRIT: 45.4 % (ref 39.0–52.0)
Hemoglobin: 16.2 g/dL (ref 13.0–17.0)
LYMPHS PCT: 17 %
Lymphs Abs: 1.6 10*3/uL (ref 0.7–4.0)
MCH: 31 pg (ref 26.0–34.0)
MCHC: 35.7 g/dL (ref 30.0–36.0)
MCV: 87 fL (ref 78.0–100.0)
Monocytes Absolute: 0.7 10*3/uL (ref 0.1–1.0)
Monocytes Relative: 8 %
NEUTROS ABS: 7.4 10*3/uL (ref 1.7–7.7)
Neutrophils Relative %: 75 %
PLATELETS: 262 10*3/uL (ref 150–400)
RBC: 5.22 MIL/uL (ref 4.22–5.81)
RDW: 12.7 % (ref 11.5–15.5)
WBC: 9.9 10*3/uL (ref 4.0–10.5)

## 2018-03-10 LAB — URINALYSIS, MICROSCOPIC (REFLEX)

## 2018-03-10 LAB — URINALYSIS, ROUTINE W REFLEX MICROSCOPIC
BILIRUBIN URINE: NEGATIVE
GLUCOSE, UA: NEGATIVE mg/dL
Ketones, ur: 15 mg/dL — AB
Leukocytes, UA: NEGATIVE
Nitrite: NEGATIVE
Protein, ur: NEGATIVE mg/dL
Specific Gravity, Urine: 1.01 (ref 1.005–1.030)
pH: 7 (ref 5.0–8.0)

## 2018-03-10 IMAGING — CT CT MAXILLOFACIAL W/O CM
1 series · 16 of 30 positions shown, 20 images · non-contrast
Comparison: CT sinus 04/24/2017

CLINICAL DATA: Chronic sinusitis

EXAM:
CT MAXILLOFACIAL WITHOUT CONTRAST
TECHNIQUE: Multidetector CT images of the paranasal sinuses were obtained using
the standard protocol without intravenous contrast.

[Series 4: maxofacial soft · axial · 0.38mm/px · z∈[+101,+215]mm · 16 of 42 slices shown, 20 images]
[im 2/42  brain]
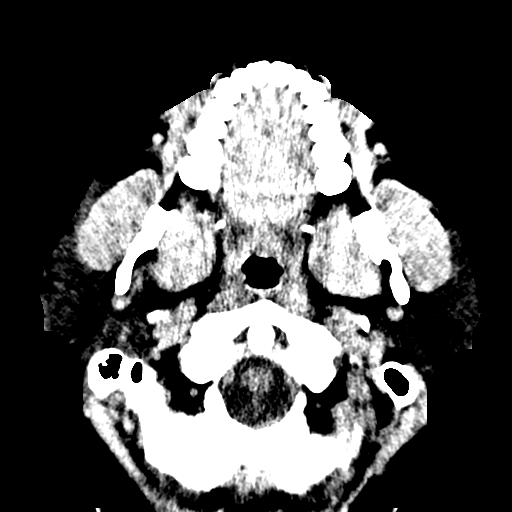
[im 2/42  bone]
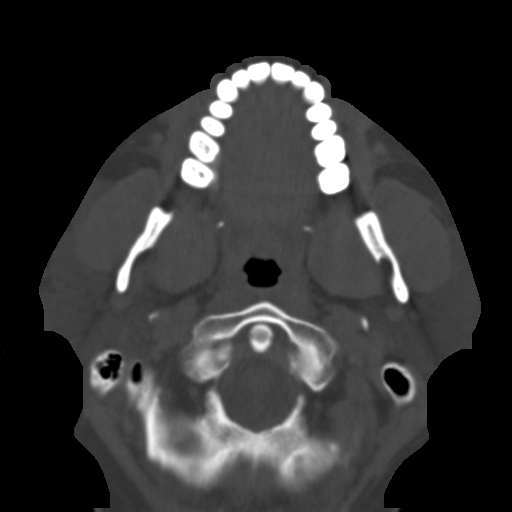
[im 5/42  bone]
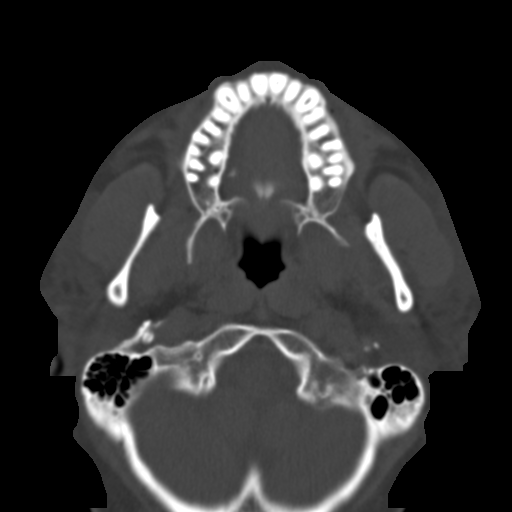
[im 8/42  bone]
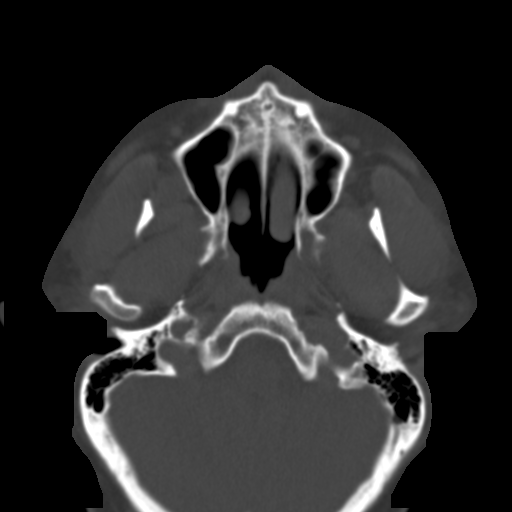
[im 10/42  bone]
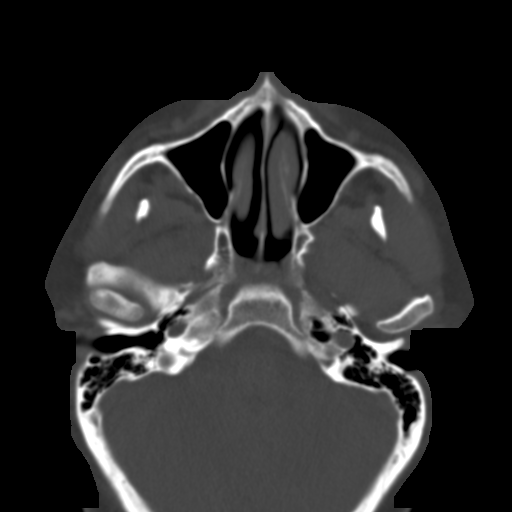
[im 12/42  brain]
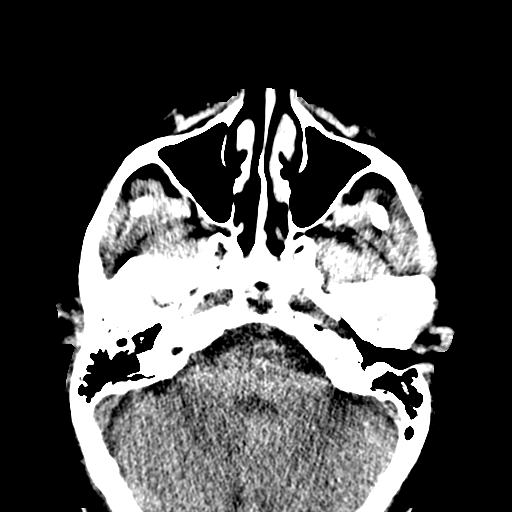
[im 12/42  bone]
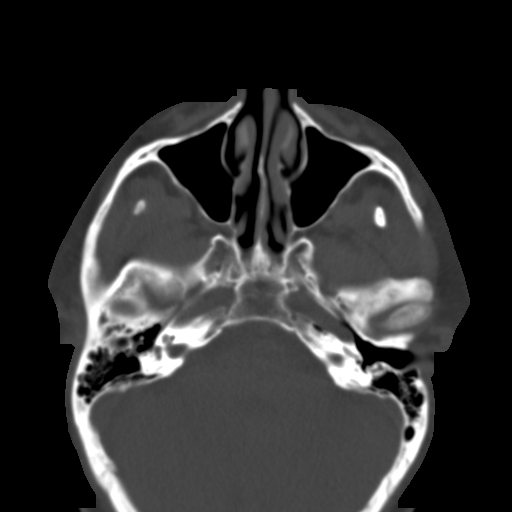
[im 15/42  bone]
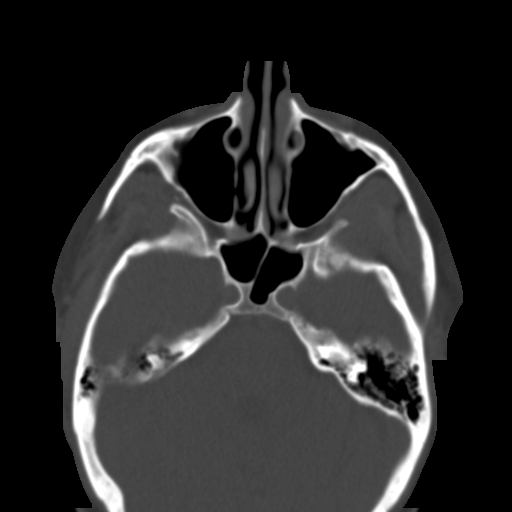
[im 17/42  bone]
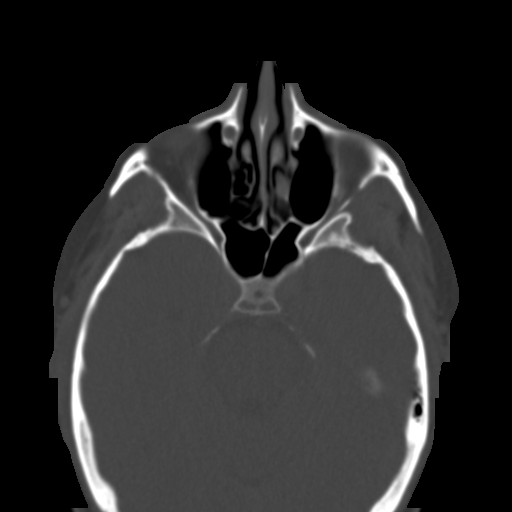
[im 20/42  bone]
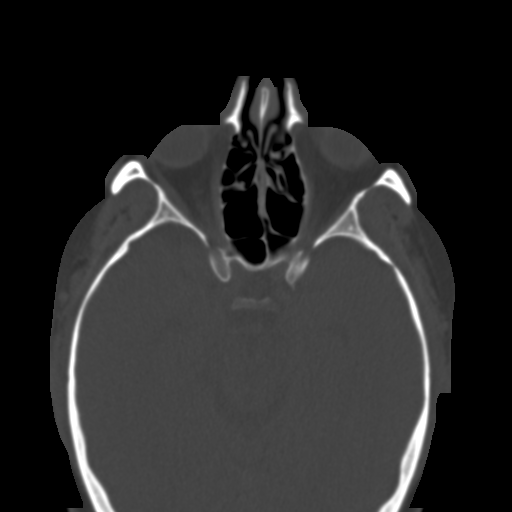
[im 22/42  brain]
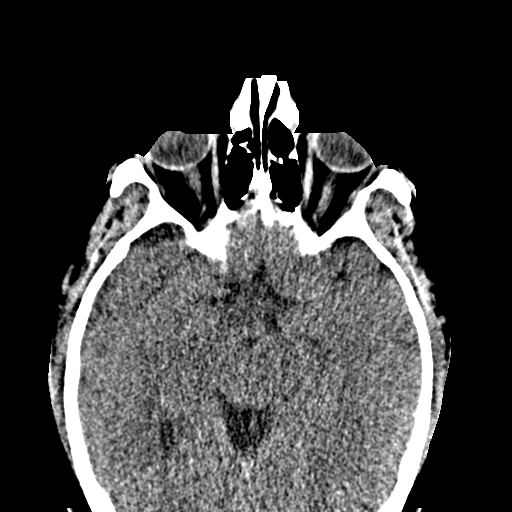
[im 22/42  bone]
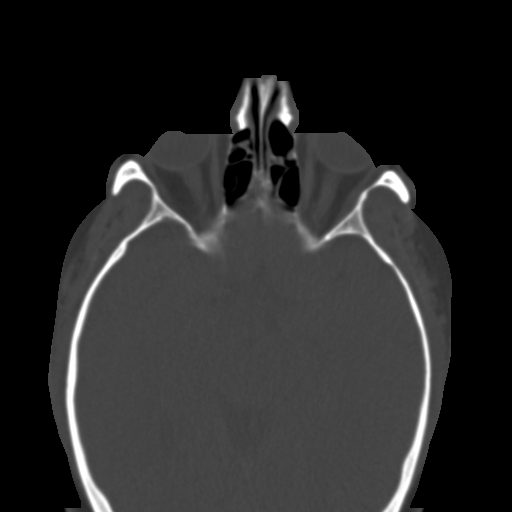
[im 25/42  bone]
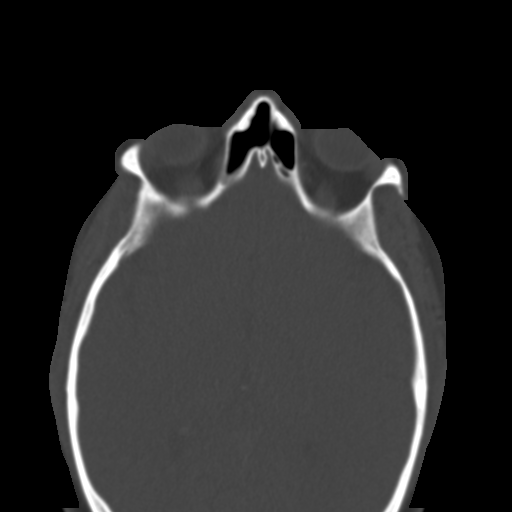
[im 27/42  bone]
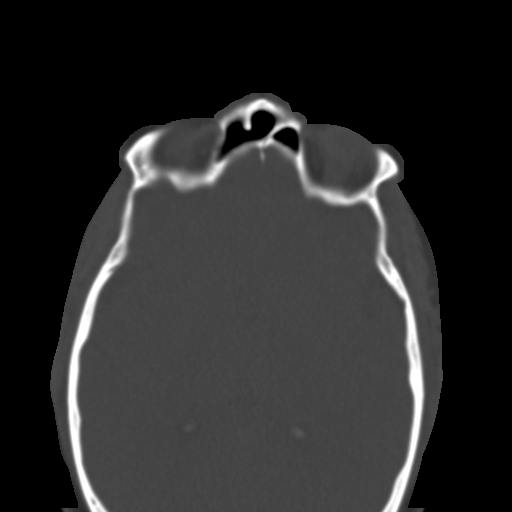
[im 30/42  bone]
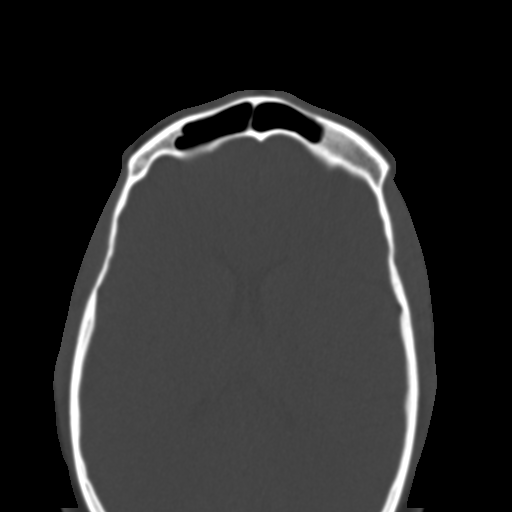
[im 32/42  brain]
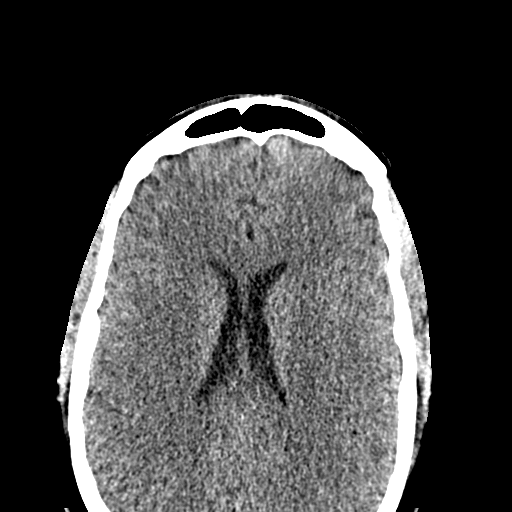
[im 32/42  bone]
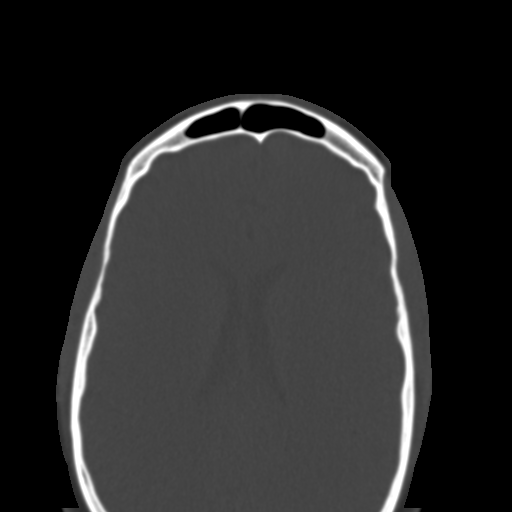
[im 34/42  bone]
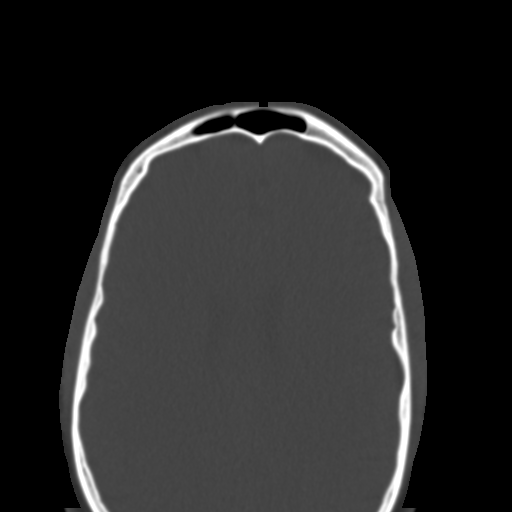
[im 37/42  bone]
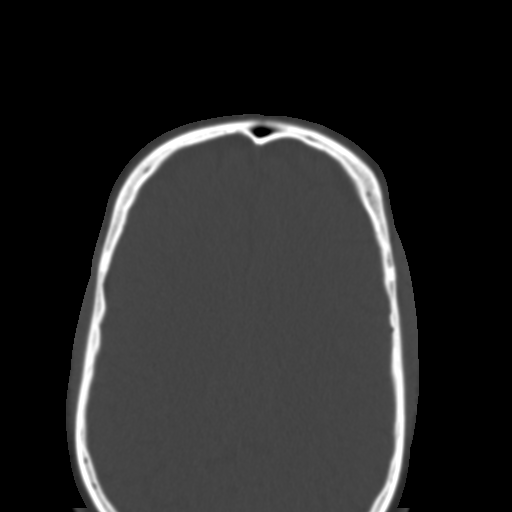
[im 40/42  bone]
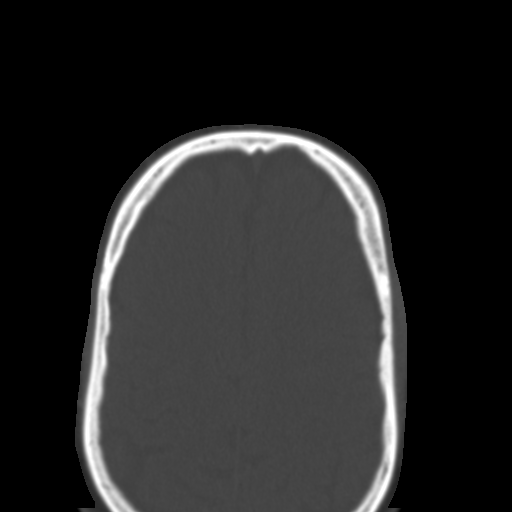

[16 of 30 positions shown; findings below may reference images not displayed]

FINDINGS: Paranasal sinuses:

Frontal: Normally aerated. Patent frontal sinus drainage pathways.

Ethmoid: Normally aerated.

Maxillary: Mild mucosal edema in the maxillary sinus bilaterally.
Mild improvement on the left.

Sphenoid: Normally aerated. Patent sphenoethmoidal recesses.

Mastoid:  Well aerated and clear

Right ostiomeatal unit: Patent.

Left ostiomeatal unit: Patent.

Nasal passages: Patent. Intact nasal septum is midline.

Other: Limited intracranial imaging negative. Normal orbital
structures.
IMPRESSION: Mild mucosal edema in the maxillary sinus bilaterally, with mild
improvement on the left since 04/24/2017. Remaining sinuses clear.
No air-fluid levels.

## 2018-03-10 MED ORDER — KETOROLAC TROMETHAMINE 30 MG/ML IJ SOLN
30.0000 mg | Freq: Once | INTRAMUSCULAR | Status: AC
  Administered 2018-03-10: 30 mg via INTRAVENOUS
  Filled 2018-03-10: qty 1

## 2018-03-10 MED ORDER — SODIUM CHLORIDE 0.9 % IV BOLUS
1000.0000 mL | Freq: Once | INTRAVENOUS | Status: AC
  Administered 2018-03-10: 1000 mL via INTRAVENOUS

## 2018-03-10 MED ORDER — CYCLOBENZAPRINE HCL 10 MG PO TABS: 10.0000 mg | ORAL_TABLET | Freq: Two times a day (BID) | ORAL | 0 refills | Status: DC | PRN

## 2018-03-10 MED ORDER — IOPAMIDOL (ISOVUE-300) INJECTION 61%
100.0000 mL | Freq: Once | INTRAVENOUS | Status: AC | PRN
  Administered 2018-03-10: 100 mL via INTRAVENOUS

## 2018-03-10 MED ORDER — ACETAMINOPHEN 325 MG PO TABS
650.0000 mg | ORAL_TABLET | Freq: Once | ORAL | Status: AC
  Administered 2018-03-10: 650 mg via ORAL
  Filled 2018-03-10: qty 2

## 2018-03-10 NOTE — Discharge Instructions (Addendum)
You were here today with muscle pains in various different parts of your body.  We did lab work which was completely normal including no evidence of infection.  In addition we did lab work that looked at your liver, gallbladder, kidneys.  They were all normal.  In addition we did a chest x-ray which showed no evidence of infection.  In addition we did a CT abdomen pelvis which showed no evidence of abnormalities in your internal abdominal organs.  Please use ibuprofen and Tylenol to help with your muscle pains and follow-up with your primary care physician.  As we discussed there is diagnostic uncertainty about why it is you are having this pain, and therefore  we recommend that you please return with any concerns.

## 2018-03-10 NOTE — Assessment & Plan Note (Signed)
patient's musculoskeletal and neurologic exams of back and lower extremities are reassuring.  I doubt his current symptoms are related to his lumbar puncture.  He's describing pain multiple areas that is greater in severity than expected with simple spasms and in more areas than expected without MVA or fall.  Also occurred insidiously 9 hours after his procedure.  He does have abdominal pain but denies GI or GU symptoms currently and is afebrile.  Regardless advised evaluation in emergency department for further workup and evaluation.  He has a call in to the neurologist as well but again doubt this is related to his lumbar puncture.

## 2018-03-10 NOTE — ED Triage Notes (Signed)
Pt c/o pain to generalized abd pain, lower back, left CP-started last night-pt had spinal tap yesterday at [facility]-pt presents to triage in w/c-NAD

## 2018-03-10 NOTE — Telephone Encounter (Signed)
Can still be post LP soreness and cramping, but he needs to be checked if the UTI is completely treated or if he needs to change to another ATB , I send this conversation to Dr. Wilson Singer.

## 2018-03-10 NOTE — ED Notes (Signed)
ED Provider at bedside. 

## 2018-03-10 NOTE — Progress Notes (Signed)
PCP: Gaspar Garbe, MD  Subjective:   HPI: Patient is a 37 y.o. male here for back, abdominal, chest pain.  Patient came in today as a walk-in for back, abdominal, chest pain. He reports yesterday he had a lumbar puncture that was without apparent issues. He was lying prone for about 15 minutes but no pain during or after the procedure. Around 10pm (procedure was at 1pm) he developed a lot pain that started in his low back, progressed to include anterior abdomen and anterior chest. Pain is severe, up to 10/10 and cannot get comfortable. Denies fevers, chills, sweats though thought he felt warm earlier (afebrile today's visit). No radiation up into neck. No bowel/bladder dysfunction. No numbness or tingling. No radiation into legs. No weakness.  Past Medical History:  Diagnosis Date  . ADHD (attention deficit hyperactivity disorder)   . Autonomic neuropathy due to diabetes (HCC)   . Diabetes (HCC)   . Fatigue   . Goiter   . High blood pressure   . High cholesterol   . Hypercholesterolemia   . Hypertension   . Hypoglycemia associated with diabetes (HCC)   . Obesity   . Tachycardia   . Type 1 diabetes mellitus not at goal [facility])   . Uncontrolled DM with microalbuminuria or microproteinuria     Current Facility-Administered Medications on File Prior to Visit  Medication Dose Route Frequency Provider Last Rate Last Dose  . gadopentetate dimeglumine (MAGNEVIST) injection 20 mL  20 mL Intravenous Once PRN Dohmeier, Porfirio Mylar, MD       Current Outpatient Medications on File Prior to Visit  Medication Sig Dispense Refill  . acetaZOLAMIDE (DIAMOX SEQUELS) 500 MG capsule Take 1 capsule (500 mg total) by mouth 2 (two) times daily. 60 capsule 5  . albuterol (PROAIR HFA) 108 (90 Base) MCG/ACT inhaler Inhale 2 puffs into the lungs every 6 (six) hours as needed for wheezing or shortness of breath. 1 Inhaler 2  . ARNUITY ELLIPTA 200 MCG/ACT AEPB INL 1 PUFF ITL D  5  . AUVI-Q 0.3  MG/0.3ML SOAJ injection Use as directed for life-threatening allergic reaction. 4 Device 2  . clonazePAM (KLONOPIN) 0.5 MG tablet Take 0.5 mg by mouth 2 (two) times daily as needed.  0  . HUMALOG 100 UNIT/ML injection INJECT 300 UNITS VIA INSULIN PUMP EVERY 48 TO 72 HOURS PER HYPERGLYCEMIA AND DKA PROTOCOLS. 40 mL 5  . losartan (COZAAR) 50 MG tablet TAKE 1 TABLET(50 MG) BY MOUTH DAILY 30 tablet 4  . montelukast (SINGULAIR) 10 MG tablet TAKE 1 TABLET(10 MG) BY MOUTH AT BEDTIME 30 tablet 4  . ONE TOUCH ULTRA TEST test strip Check sugar 15 x daily 450 each 3  . ONE TOUCH ULTRA TEST test strip USE AS DIRECTED SIX TIMES DAILY 200 each 5    Past Surgical History:  Procedure Laterality Date  . none    . WISDOM TOOTH EXTRACTION      Allergies  Allergen Reactions  . Flonase [Fluticasone] Shortness Of Breath  . Sulfites Itching and Swelling  . Molds & Smuts   . Tetracyclines & Related Other (See Comments)    increased intracranial pressure    Social History   Socioeconomic History  . Marital status: Single    Spouse name: Not on file  . Number of children: Not on file  . Years of education: Not on file  . Highest education level: Not on file  Occupational History  . Not on file  Social Needs  . Financial  resource strain: Not on file  . Food insecurity:    Worry: Not on file    Inability: Not on file  . Transportation needs:    Medical: Not on file    Non-medical: Not on file  Tobacco Use  . Smoking status: Never Smoker  . Smokeless tobacco: Never Used  Substance and Sexual Activity  . Alcohol use: No  . Drug use: No  . Sexual activity: Not on file  Lifestyle  . Physical activity:    Days per week: Not on file    Minutes per session: Not on file  . Stress: Not on file  Relationships  . Social connections:    Talks on phone: Not on file    Gets together: Not on file    Attends religious service: Not on file    Active member of club or organization: Not on file     Attends meetings of clubs or organizations: Not on file    Relationship status: Not on file  . Intimate partner violence:    Fear of current or ex partner: Not on file    Emotionally abused: Not on file    Physically abused: Not on file    Forced sexual activity: Not on file  Other Topics Concern  . Not on file  Social History Narrative   ** Merged History Encounter **        Family History  Problem Relation Age of Onset  . Dementia Paternal Aunt   . Schizophrenia Paternal Aunt   . Cancer Maternal Grandmother   . Diabetes Maternal Grandmother        T2 DM  . Cancer Maternal Grandfather   . Thyroid disease Neg Hx     BP 122/86   Ht 6' (1.829 m)   Wt 260 lb (117.9 kg)   BMI 35.26 kg/m   Review of Systems: See HPI above.     Objective:  Physical Exam:  Gen: uncomfortable in exam room, frequently changing positions.  Abdomen: Soft but tender RLQ > LLQ without rebound or guarding.  Back: No gross deformity, scoliosis. No TTP paraspinal muscles.  No midline or bony TTP. FROM without pain. Strength LEs 5/5 all muscle groups.   2+ MSRs in patellar and achilles tendons, equal bilaterally. Negative SLRs. Sensation intact to light touch bilaterally. Negative logroll bilateral hips   Assessment & Plan:  1. Back, abdominal pain - patient's musculoskeletal and neurologic exams of back and lower extremities are reassuring.  I doubt his current symptoms are related to his lumbar puncture.  He's describing pain multiple areas that is greater in severity than expected with simple spasms and in more areas than expected without MVA or fall.  Also occurred insidiously 9 hours after his procedure.  He does have abdominal pain but denies GI or GU symptoms currently and is afebrile.  Regardless advised evaluation in emergency department for further workup and evaluation.  He has a call in to the neurologist as well but again doubt this is related to his lumbar puncture.

## 2018-03-10 NOTE — Telephone Encounter (Signed)
Called the patient and made him aware that typically we hear problems with headaches not necessarily back pain. I asked the patient if it was just around the puncture site and pt states that it feels like a pulled muscle. I inquired about how they had him positioned during the procedure if maybe it was he positioned in a way that may have caused a pulled muscle. The patient states that the testing lasted only 15-20 min. He states that when he is still the feeling is dull and achy. When he gets up or with movement the pain intensifies. I have informed him that I will mention this information to Dr Vickey Huger and see what her thoughts are and give him a call back. Pt verbalized understanding.

## 2018-03-10 NOTE — ED Provider Notes (Signed)
[facility] EMERGENCY DEPARTMENT Provider Note   CSN: [id number] Arrival date & time: 03/10/18  1529     History   Chief Complaint Chief Complaint  Patient presents with  . Abdominal Pain    HPI [name] is a 37 y.o. male.  HPI   Patient is a 37 year old male with relatively complicated past medical history.  Patient has history of type 1 diabetes, obesity, hypertension, hyperlipidemia.  Patient had been having multiple surgeries on his eye due to retinopathy.  However patient recently referred to a neurologist.  The neurologist did an outpatient lumbar puncture yesterday found to have an elevated opening pressure and was started on Diamox.  Additionally per neurology, recommended that he discontinue tetracycline which he was on for a chronic urinary tract infection per urology.  Patient has not restarted any antibiotics for urology.  According to patient patient had multiple urinary tract infections over the course of the last 6 months with treatment with sulfa (he was allergic to it) Keflex (he did not resolve the symptoms) and most recently tetracycline.  He has multiple complaints.  He reports bilateral abdominal pain in the very lateral aspects of the abdomen only when moving.  Bilateral chest pain with moving on the lateral aspects.  And then pain in his paraspinal muscles when moving.  He reports most of the pain is when moving.  He denies any fever nausea vomiting.  Patient had a follow-up with orthopedics appointment and they recommended they come  Be  seen here because of the abdominal pain.  No meningismus.  He does report paraspinal pain with complete chin flexion, but no symptoms when moving bilateral lower extremities.  Past Medical History:  Diagnosis Date  . ADHD (attention deficit hyperactivity disorder)   . Autonomic neuropathy due to diabetes (HCC)   . Diabetes (HCC)   . Fatigue   . Goiter   . High blood pressure   . High cholesterol   .  Hypercholesterolemia   . Hypertension   . Hypoglycemia associated with diabetes (HCC)   . Obesity   . Tachycardia   . Type 1 diabetes mellitus not at goal [facility])   . Uncontrolled DM with microalbuminuria or microproteinuria     Patient Active Problem List   Diagnosis Date Noted  . Cervicalgia of occipito-atlanto-axial region 08/31/2017  . Type 1 diabetes mellitus with diabetic autonomic neuropathy (HCC) 08/31/2017  . Calcified lymph nodes 06/04/2017  . Insulin pump in place 05/28/2017  . Neuropathy 05/28/2017  . 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 Other symptoms and signs involving the nervous system 05/28/2017  . Nightmares REM-sleep type 05/28/2017  . Proliferative diabetic retinopathy, right eye (HCC) 04/11/2017  . Benign paroxysmal positional vertigo 03/29/2017  . Costochondritis 03/06/2017  . Acute upper respiratory infection 03/06/2017  . Combined hyperlipidemia 02/11/2016  . Metatarsalgia of right foot 02/07/2016  . Lactose intolerance 01/07/2016  . Inappropriate sinus tachycardia 06/29/2015  . Thoracic back pain 11/02/2013  . Type 1 diabetes mellitus not at goal [facility])   . Goiter   . Obesity   . Hypercholesterolemia   . ADHD (attention deficit hyperactivity disorder)   . Fatigue   . Tachycardia   . Autonomic neuropathy associated with type 1 diabetes mellitus (HCC)   . Hypertension   . Uncontrolled DM with microalbuminuria or microproteinuria   . Hypoglycemia associated with diabetes (HCC)   . DIABETES MELLITUS, I 12/24/2006  . ATTENTION DEFICIT, W/HYPERACTIVITY 12/24/2006  Past Surgical History:  Procedure Laterality Date  . none    . WISDOM TOOTH EXTRACTION          Home Medications    Prior to Admission medications   Medication Sig Start Date End Date Taking? Authorizing Provider  acetaZOLAMIDE (DIAMOX SEQUELS) 500 MG capsule Take 1 capsule (500 mg total) by mouth 2 (two) times  daily. 03/09/18   Dohmeier, Porfirio Mylar, MD  albuterol (PROAIR HFA) 108 (90 Base) MCG/ACT inhaler Inhale 2 puffs into the lungs every 6 (six) hours as needed for wheezing or shortness of breath. 08/25/17   Kozlow, Alvira Philips, MD  ARNUITY ELLIPTA 200 MCG/ACT AEPB INL 1 PUFF ITL D 10/06/17   [provider]  AUVI-Q 0.3 MG/0.3ML SOAJ injection Use as directed for life-threatening allergic reaction. 08/13/16   Kozlow, Alvira Philips, MD  clonazePAM (KLONOPIN) 0.5 MG tablet Take 0.5 mg by mouth 2 (two) times daily as needed. 05/18/17   [provider]  HUMALOG 100 UNIT/ML injection INJECT 300 UNITS VIA INSULIN PUMP EVERY 48 TO 72 HOURS PER HYPERGLYCEMIA AND DKA PROTOCOLS. 07/13/17   David Stall, MD  losartan (COZAAR) 50 MG tablet TAKE 1 TABLET(50 MG) BY MOUTH DAILY 12/01/17   Marcelyn Bruins, MD  montelukast (SINGULAIR) 10 MG tablet TAKE 1 TABLET(10 MG) BY MOUTH AT BEDTIME 12/01/17   Marcelyn Bruins, MD  ONE [facility] ULTRA TEST test strip Check sugar 15 x daily 05/15/17 05/15/18  David Stall, MD  ONE [facility] ULTRA TEST test strip USE AS DIRECTED SIX TIMES DAILY 09/04/17   David Stall, MD    Family History Family History  Problem Relation Age of Onset  . Dementia Paternal Aunt   . Schizophrenia Paternal Aunt   . Cancer Maternal Grandmother   . Diabetes Maternal Grandmother        T2 DM  . Cancer Maternal Grandfather   . Thyroid disease Neg Hx     Social History Social History   Tobacco Use  . Smoking status: Never Smoker  . Smokeless tobacco: Never Used  Substance Use Topics  . Alcohol use: No  . Drug use: No     Allergies   Flonase [fluticasone]; Sulfites; Molds & smuts; and Tetracyclines & related   Review of Systems Review of Systems  Constitutional: Negative for activity change, fatigue and fever.  Respiratory: Negative for shortness of breath.   Cardiovascular: Negative for chest pain.  Gastrointestinal: Negative for abdominal pain.    Musculoskeletal: Positive for back pain and myalgias.  All other systems reviewed and are negative.    Physical Exam Updated Vital Signs There were no vitals taken for this visit.  Physical Exam  Constitutional: He is oriented to person, place, and time. He appears well-developed and well-nourished.  HENT:  Head: Normocephalic.  Mouth/Throat: Oropharynx is clear and moist.  Eyes: Conjunctivae are normal.  Cardiovascular: Normal rate and normal heart sounds.  Pulmonary/Chest: Effort normal and breath sounds normal. No respiratory distress.  Abdominal: Soft. Normal appearance. There is generalized tenderness.  Mild tenderness in the lateral aspect of the abdomen.  Bilaterally.  Neurological: He is oriented to person, place, and time.  Skin: Skin is warm and dry. He is not diaphoretic.  Psychiatric: He has a normal mood and affect. His behavior is normal.     ED Treatments / Results  Labs (all labs ordered are listed, but only abnormal results are displayed) Labs Reviewed  URINE CULTURE  CBC WITH DIFFERENTIAL/PLATELET  COMPREHENSIVE METABOLIC PANEL  URINALYSIS, ROUTINE W REFLEX MICROSCOPIC    EKG None  Radiology Dg Fluoro Guided Loc Of Needle/cath Tip For Spinal Inject Lt  Result Date: 03/09/2018 CLINICAL DATA:  Headache.  Vision loss.  Diabetic retinopathy. EXAM: DIAGNOSTIC LUMBAR PUNCTURE UNDER FLUOROSCOPIC GUIDANCE FLUOROSCOPY TIME:  Fluoroscopy Time:  48 seconds Radiation Exposure Index (if provided by the fluoroscopic device): Number of Acquired Spot Images: 0 PROCEDURE: Informed consent was obtained from the patient prior to the procedure, including potential complications of headache, allergy, and pain. With the patient prone, the lower back was prepped with Betadine. 1% Lidocaine was used for local anesthesia. Lumbar puncture was performed at the L2-3 level using a 22 gauge needle with return of clear CSF with an opening pressure of 28 cm water. Ten ml of CSF were  obtained for laboratory studies. The patient tolerated the procedure well and there were no apparent complications. IMPRESSION: Successful lumbar spine under. Opening pressure was 28 cm of water. CSF sample has been submitted to the laboratory for analysis. Electronically Signed   By: Signa Kell M.D.   On: 03/09/2018 13:56    Procedures Procedures (including critical care time)  Medications Ordered in ED Medications  sodium chloride 0.9 % bolus 1,000 mL (has no administration in time range)  ketorolac (TORADOL) 30 MG/ML injection 30 mg (has no administration in time range)     Initial Impression / Assessment and Plan / ED Course  I have reviewed the triage vital signs and the nursing notes.  Pertinent labs & imaging results that were available during my care of the patient were reviewed by me and considered in my medical decision making (see chart for details).      Patient is a 37 year old male with relatively complicated past medical history.  Patient has history of type 1 diabetes, obesity, hypertension, hyperlipidemia.  Patient had been having multiple surgeries on his eye due to retinopathy.  However patient recently referred to a neurologist.  The neurologist did an outpatient lumbar puncture yesterday found to have an elevated opening pressure and was started on Diamox.  Additionally per neurology, recommended that he discontinue tetracycline which he was on for a chronic urinary tract infection per urology.  Patient has not restarted any antibiotics for urology.  According to patient patient had multiple urinary tract infections over the course of the last 6 months with treatment with sulfa (he was allergic to it) Keflex (he did not resolve the symptoms) and most recently tetracycline.  He has multiple complaints.  He reports bilateral abdominal pain in the very lateral aspects of the abdomen only when moving.  Bilateral chest pain with moving on the lateral aspects.  And then pain  in his paraspinal muscles when moving.  He reports most of the pain is when moving.  He denies any fever nausea vomiting.  Patient had a follow-up with orthopedics appointment and they recommended they come  Be  seen here because of the abdominal pain.  No meningismus.  He does report paraspinal pain with complete chin flexion, but no symptoms when moving bilateral lower extremities.  4:11 PM Although medically complex, patient really appears very well today.  Has normal vital signs.  Was sent here for abdominal imaging, so we will get labs and consider abdominal imaging given his mild tenderness.  I cannot think of an etiology that involves the lateral muscular aspects of the stomach, chest wall, and back.  Except that some kind of systemic infection.  Will get labs, get urine.  The LP was done less than 24 hours ago, do not suspect any kind of infection or bleeding from the procedure.  No meningismus.  5:55 PM All patient's labs, x-ray, CAT scan, and vital signs remain reassuring.  Long discussion had with patient and mom.  Apparently he been to over 12 specialist in the last year.  Today I do not see any acute abnormalities, therefore would not initiate any new medications given his history of multiple providers, with conflicting courses of care.  Patient has this tremor which I will have him continue to follow neurology for.  Do not see any evidence of anything requiring immediate intervention.  We will have him follow-up with his primary care physician. Final Clinical Impressions(s) / ED Diagnoses   Final diagnoses:  None    ED Discharge Orders    None       Jamori Biggar, Cindee Salt, MD 03/10/18 1757

## 2018-03-10 NOTE — Telephone Encounter (Signed)
Patient had a spinal tap yesterday and is experiencing back pain. Is this normal to have back pain?

## 2018-03-10 NOTE — Telephone Encounter (Signed)
Called the pt and informed him that Dr Vickey Huger recommends the pt take ibuprofen up to 800 mg for pain and apply a heat pad to help with the discomfort. Pt states the pain wraps around his abdomen and is really intense. I informed him that this is not common pain associated with a LP. It is common to have a pulled muscle or some discomfort at the puncture site but usually it is due to muscle soreness caused by position during the procedure. I informed the pt that if this is not getting better and is causing him that much pain then I would advise the pt have it evaluated to make sure something else isn't going on. The patient's wife got on the phone to discuss the medication that was called in. I went over the Diamox that was ordered and informed her that Dr Vickey Huger just wanted him to advise his diabetic doctor because this can interact with blood sugar results. I also made sure the pt was not taking the tetracylcine and at that point the wife states he had and informs me that he has been battling a bladder infection which has not been able to be treated because having to stop certain medications. I informed her that with the symptoms that he described this could also be related to that. Pt has a schedule planned for Thursday and then a follow up apt with [place] on Friday but I recommend the pt have this evaluated to make sure nothing else is going on. Pt verbalized understanding.

## 2018-03-10 NOTE — Patient Instructions (Signed)
Your musculoskeletal exam and neuro exam are normal. The level of pain you're experiencing and the multiple locations are not typical of an orthopedic issue or would be expected from a lumbar puncture or pinched nerve. You have some abdominal pain and tenderness. Given the level of pain and location, you should be worked up in the emergency department, evaluated for possible intraabdominal pathology. [facility] is at [employer] in [place].

## 2018-03-11 LAB — URINE CULTURE: Culture: NO GROWTH

## 2018-03-12 ENCOUNTER — Ambulatory Visit (INDEPENDENT_AMBULATORY_CARE_PROVIDER_SITE_OTHER): Payer: 59 | Admitting: Neurology

## 2018-03-12 ENCOUNTER — Other Ambulatory Visit: Payer: Self-pay | Admitting: Neurology

## 2018-03-12 ENCOUNTER — Ambulatory Visit
Admission: RE | Admit: 2018-03-12 | Discharge: 2018-03-12 | Disposition: A | Discharge status: Home / Self Care | Payer: 59 | Source: Ambulatory Visit | Attending: Neurology | Admitting: Neurology

## 2018-03-12 ENCOUNTER — Encounter: Payer: Self-pay | Admitting: Neurology

## 2018-03-12 VITALS — BP 118/82 | HR 102 | Ht 72.0 in | Wt 266.0 lb

## 2018-03-12 DIAGNOSIS — G971 Other reaction to spinal and lumbar puncture: Secondary | ICD-10-CM

## 2018-03-12 DIAGNOSIS — E133519 Other specified diabetes mellitus with proliferative diabetic retinopathy with macular edema, unspecified eye: Secondary | ICD-10-CM | POA: Diagnosis not present

## 2018-03-12 DIAGNOSIS — H4711 Papilledema associated with increased intracranial pressure: Secondary | ICD-10-CM | POA: Diagnosis not present

## 2018-03-12 LAB — CSF CULTURE W GRAM STAIN

## 2018-03-12 LAB — CSF CULTURE
CULTURE: NO GROWTH
GRAM STAIN: NONE SEEN

## 2018-03-12 MED ORDER — IOPAMIDOL (ISOVUE-M 200) INJECTION 41%
1.0000 mL | Freq: Once | INTRAMUSCULAR | Status: AC
  Administered 2018-03-12: 1 mL via EPIDURAL

## 2018-03-12 NOTE — Progress Notes (Signed)
SLEEP MEDICINE CLINIC   Provider:  Melvyn Novas, M D  Primary Care Physician:  Joseph Garbe, MD   Referring Provider: ophthalmologist Dr. Luciana Axe, who is also a retina specialist.  The patient is further followed by endocrinologist Dr. Molli Knock since he is diabetic type I.   Chief Complaint  Patient presents with  . Follow-up    pt with mom, Diamox 5/16. ER on 5/15, day after LP. was complaining of muscle/back pain around his abd. did not state headaches at the time of calling in the office. pt started to develop headaches on 5/15 while in the ED and made them aware of headahces. They didnt feel that it was a problem since it wasnt causing him as much discomfort. Headache pain peaked on 5/16 when he went to eye MD, by time he arrived he could barely function. eye MD did not do procedure, and encouraged pt lay.     HPI:  03-12-2018,   I have the pleasure of seeing [name] today in a follow-up visit.  I had seen the patient on 14 May when he presented with very concerning data from his ophthalmologist, retro-orbital pressure was identified, and he was from here referred after physical and neurological exam for an immediate fluoroscopic lumbar puncture.  This was performed at 1 PM at [facility] imaging, no he was [facility] hospital location, a successful nontraumatic spinal tap was obtained, between the second and third lumbar vertebra opening pressure was 28 cmH2O.  Except for an elevated glucose level in the CSF there was no other abnormality, no elevated protein no white blood cells or red cells and all cultures thus far have been negative.  After the spinal tap developed abdominal almost spasms, as well as cramping at the chest wall muscles.  By day 3 post spinal tap he has developed typical spinal tap headaches that arise when he sits up or walks sometimes needing about 10 to 15 minutes.  He feels much better laying flat and laying reclining.  In order to treat the spinal  tap headaches I will send him for a blood patch, urinary tract infection was ruled out his urinary sample to clean I was concerned since I discontinued his antibiotics.  I still think that the tetracyclines probably tipped the intracranial pressure.  He saw Dr. Luciana Axe his retinal specialist yesterday, they were pleased with the results as to his retinal changes, any further procedure from ophthalmology will be pending until gait feels better has less headaches and discomfort.  I also started Diamox at 500 mg twice daily should he developed gastrointestinal side effects from this medication I would like in that case to reduce it to 250 in the morning 250 at night and we can still advance the dose later on after a couple of weeks or so.    03-09-2018, Patient returned for an urgent visit based on vision changes and diabetic changes to the eye, seen by Dr. Luciana Axe.  He developed headaches , changes to green tinged vision, some tremor and HA are worse when he in bed, better when he rises. Dynamic intracranial pressure changes He gets nauseated, and worse vision changes when moving. He feels a vibration and tension in the neck and pressure behind the right eye more than left, he sleeps on his right. History of chronic sinusitis, with tinnitus since 10/ 2017 . He tested negative for OSA. He had initially improved in HA, tinnitus and Vertigo. Tremor could not be objectively seen.  He  has been a diabetic Type 1, followed by Dr.  Molli Knock, with an insulin pump.  MRI brain and sinus CT in the last 6 month. Seen by ENT and allergy specialist.  His upper respiratory infects have decreased, since allergy treatment.   Topical vancomycin and extensive notes over, the last visit with the patient was on May 9 Thursday 2019, the patient had been treated with Avastin injections into the left and right eye, beginning in August 2018 the last right eye injection was on November 09, 2017.  Patient is currently on  doxycycline, Arnuity, Humalog insulin, Klonopin, losartan and montelukast.  Impression was that the patient has controlled diabetes type 1 diabetes mellitus with right eye affected by proliferative retinopathy and macular edema Dr. Luciana Axe documented that he felt this was worsening.  He also stated the same for the left eye also to a slightly lesser degree.  There is also a minor cataract forming in both eyes, there is optic papillitis which has not been graded, ocular pain, and a history of acute sinusitis and retinal hemorrhage he mentions the patient snores and has headaches with valsalva-.  Transient obscuration of vision in both eyes- last  MRI scan was normal at the time/ 2018.   Headaches much worse when bending over, body habitus and doxycycline medication may cause this patient's likely intracranial pressure elevation.  ENT cleared him of sinusitis: CLINICAL DATA:  Chronic sinusitis  EXAM: CT MAXILLOFACIAL WITHOUT CONTRAST  TECHNIQUE: Multidetector CT images of the paranasal sinuses were obtained using the standard protocol without intravenous contrast.  COMPARISON:  CT sinus 04/24/2017  FINDINGS: Paranasal sinuses:  Frontal: Normally aerated. Patent frontal sinus drainage pathways.  Ethmoid: Normally aerated.  Maxillary: Mild mucosal edema in the maxillary sinus bilaterally. Mild improvement on the left.  Sphenoid: Normally aerated. Patent sphenoethmoidal recesses.  Mastoid:  Well aerated and clear  Right ostiomeatal unit: Patent.  Left ostiomeatal unit: Patent.  Nasal passages: Patent. Intact nasal septum is midline.  Other: Limited intracranial imaging negative. Normal orbital structures.  IMPRESSION: Mild mucosal edema in the maxillary sinus bilaterally, with mild improvement on the left since 04/24/2017. Remaining sinuses clear. No air-fluid levels.   Electronically Signed   By: Marlan Palau M.D.   On: 11/02/2017 11:32  08-31-2017, [name] is seen here today for a follow-up visit after his sleep study from 06/18/2017.  The patient again had such a mild degree of apnea that it does not need to be  treated with any intervention.  His AHI was only 2.6/h not considered pathologically.  His REM sleep AHI was 14.3/h and supine AHI 3.4.  We meet today to also look at his sleep experience since the study.  The patient reports that he still wakes up with trembling in both arms, he has shoulder discomfort which keeps him from sleeping on his side.  However in intervention with CPAP, PLM medication or oxygen is not necessary at this point.  I do not have a good understanding as to why he trembles, and why these trembling spells wake him from sleep.  He has been followed closely for his thyroid function and reports that it has been in the high normal range.  He still reports some palpitations that he experiences at night.  He also reports facial twitching at night. He related the onset to psychiatric medication ( wellbutrin, trazodone). He has year around asthma and allergies.  I have no interventional therapies available.    CONSULT : SLEEP CLINIC:  [name] is a 37 y.o. male , seen here as in a referral/ revisit  from sports medicine and Dr Fransico Michael, ped endocrinologist.  I would like to thank Dr. Fransico Michael for his report notes which helped me today to design further sleep evaluation and treatment for this patient. He also gave me an excellent summary of the past medical problems that Mr. Nolasco has faced. Mr.  Tugwell was diagnosed with diabetes at age 28 and has been followed by a pediatric endocrinologist, Dr. Fransico Michael. His blood sugars have been better and more stable on an auto mode Medtronic insulin pump. He also faces problems with obesity, hypertension, hyperlipidemia and an autonomic neuropathy with tachycardia and gastroparesis. As blood pressure control had improved so did his heart rate and his postprandial bloating. He has been  treated for thyroiditis consistent with an evolving Hashimoto's ultra immune thyroid disease. He was euthyroid again in March 2018 when last checked he had reported fatigue and was diagnosed by a home sleep test last year at " [facility]", was  supposedly diagnosed with obstructive sleep apnea but had not treatment initiated .  He has proliferative retinopathy, attributed to diabetes. Dr. Fransico Michael also wanted to make sure that current rather newish neuromuscular symptoms beI evaluated -he was especially concerned that this patient is prone to further auto-immune diseases such as central demyelination or multiple sclerosis. Dr. Darrick Penna in this sport medicine section of [place] had added in this visit on 04/15/2007 but the patient habits and improvement of nausea, vertigo, after discontinuing eyedrops, trazodone and Wellbutrin and atorvastatin. He had more headaches. He felt jittery and tremors of his right hand and right leg were temporary worse. He describes photophobia. He felt shaking more in the morning l right hand and right leg, an inner jitteriness. It has improved. Vertigo not present today.    Sleep habits are as follows:  " Bedtime between 10 and 12 midnight, rises at 7 AM. He describes his sleep is rather restless, trying to find a comfortable sleep position, but she doesn't necessarily use the bed during the night. He does not report nocturia, and only twice or so in the last month has he woke up with palpitations or clamminess, likely related to hypoglycemia. He is known to snore, and he had a home sleep test at [facility] last year in [facility] and was told that he has obstructive sleep apnea. Treatment was not initiated yet. He wakes up not feeling restored or refreshed, and he has noticed that his jittery illness affecting the right extremities and a feeling of inner trembling of his torso is the worst in the morning hours. It doesn't last that long. He also has a pulsating change  of vision when he first gets up and he suffered from vertigo for much of the last 3 months. No daytime naps, he wouldn't sleep at night if he did.   Sleep medical history and family sleep history:  Dad snoring, mom is an insomniac. He remembers having problems sleeping even in childhood. He has felt fatigued and not restored for several months. Social history: works as Games developer. The patient is single, no children. No tobacco use, seldom alcohol use,  4 months ago he stopped drinking caffeine due to the jitteriness, and headaches with photophobia and a feeling of the head being full could also be relating to sinusitis.    Review of Systems: Out of a complete 14 system review, the patient complains of only the following symptoms,  and all other reviewed systems are negative.  Patient has shoulder pain and trembling in both arms, feels tight, achy. Not shooting pains, not electric sensation. Soreness   Epworth score 5, Fatigue severity score 30 , depression score n/a    Social History   Socioeconomic History  . Marital status: Single    Spouse name: Not on file  . Number of children: Not on file  . Years of education: Not on file  . Highest education level: Not on file  Occupational History  . Not on file  Social Needs  . Financial resource strain: Not on file  . Food insecurity:    Worry: Not on file    Inability: Not on file  . Transportation needs:    Medical: Not on file    Non-medical: Not on file  Tobacco Use  . Smoking status: Never Smoker  . Smokeless tobacco: Never Used  Substance and Sexual Activity  . Alcohol use: No  . Drug use: No  . Sexual activity: Not on file  Lifestyle  . Physical activity:    Days per week: Not on file    Minutes per session: Not on file  . Stress: Not on file  Relationships  . Social connections:    Talks on phone: Not on file    Gets together: Not on file    Attends religious service: Not on file    Active member of  club or organization: Not on file    Attends meetings of clubs or organizations: Not on file    Relationship status: Not on file  . Intimate partner violence:    Fear of current or ex partner: Not on file    Emotionally abused: Not on file    Physically abused: Not on file    Forced sexual activity: Not on file  Other Topics Concern  . Not on file  Social History Narrative   ** Merged History Encounter **        Family History  Problem Relation Age of Onset  . Dementia Paternal Aunt   . Schizophrenia Paternal Aunt   . Cancer Maternal Grandmother   . Diabetes Maternal Grandmother        T2 DM  . Cancer Maternal Grandfather   . Thyroid disease Neg Hx     Past Medical History:  Diagnosis Date  . ADHD (attention deficit hyperactivity disorder)   . Autonomic neuropathy due to diabetes (HCC)   . Diabetes (HCC)   . Fatigue   . Goiter   . High blood pressure   . High cholesterol   . Hypercholesterolemia   . Hypertension   . Hypoglycemia associated with diabetes (HCC)   . Obesity   . Tachycardia   . Type 1 diabetes mellitus not at goal [facility])   . Uncontrolled DM with microalbuminuria or microproteinuria     Past Surgical History:  Procedure Laterality Date  . none    . WISDOM TOOTH EXTRACTION      Current Outpatient Medications  Medication Sig Dispense Refill  . acetaminophen (TYLENOL) 500 MG tablet Take 1,000 mg by mouth 2 (two) times daily as needed.    [name] acetaZOLAMIDE (DIAMOX SEQUELS) 500 MG capsule Take 1 capsule (500 mg total) by mouth 2 (two) times daily. 60 capsule 5  . albuterol (PROAIR HFA) 108 (90 Base) MCG/ACT inhaler Inhale 2 puffs into the lungs every 6 (six) hours as needed for wheezing or shortness of breath. 1 Inhaler 2  . ARNUITY ELLIPTA  200 MCG/ACT AEPB INL 1 PUFF ITL D  5  . AUVI-Q 0.3 MG/0.3ML SOAJ injection Use as directed for life-threatening allergic reaction. 4 Device 2  . clonazePAM (KLONOPIN) 0.5 MG tablet Take 0.5 mg by mouth 2 (two) times  daily as needed.  0  . cyclobenzaprine (FLEXERIL) 10 MG tablet Take 1 tablet (10 mg total) by mouth 2 (two) times daily as needed for muscle spasms. 10 tablet 0  . HUMALOG 100 UNIT/ML injection INJECT 300 UNITS VIA INSULIN PUMP EVERY 48 TO 72 HOURS PER HYPERGLYCEMIA AND DKA PROTOCOLS. 40 mL 5  . ibuprofen (ADVIL,MOTRIN) 600 MG tablet Take 600 mg by mouth 2 (two) times daily as needed.    [name] losartan (COZAAR) 50 MG tablet TAKE 1 TABLET(50 MG) BY MOUTH DAILY 30 tablet 4  . montelukast (SINGULAIR) 10 MG tablet TAKE 1 TABLET(10 MG) BY MOUTH AT BEDTIME 30 tablet 4  . ONE TOUCH ULTRA TEST test strip Check sugar 15 x daily 450 each 3  . ONE TOUCH ULTRA TEST test strip USE AS DIRECTED SIX TIMES DAILY 200 each 5   No current facility-administered medications for this visit.    Facility-Administered Medications Ordered in Other Visits  Medication Dose Route Frequency Provider Last Rate Last Dose  . gadopentetate dimeglumine (MAGNEVIST) injection 20 mL  20 mL Intravenous Once PRN Arohi Salvatierra, Porfirio Mylar, MD        Allergies as of 03/12/2018 - Review Complete 03/12/2018  Allergen Reaction Noted  . Flonase [fluticasone] Shortness Of Breath 06/10/2017  . Sulfites Itching and Swelling 02/04/2018  . Molds & smuts  03/09/2018  . Tetracyclines & related Other (See Comments) 03/09/2018    Vitals: BP 118/82   Pulse (!) 102   Ht 6' (1.829 m)   Wt 266 lb (120.7 kg)   BMI 36.08 kg/m  Last Weight:  Wt Readings from Last 1 Encounters:  03/12/18 266 lb (120.7 kg)   ZOX:WRUE mass index is 36.08 kg/m.     Last Height:   Ht Readings from Last 1 Encounters:  03/12/18 6' (1.829 m)    Physical exam:  General: The patient is awake, alert and appears not in acute distress. The patient is well groomed. Head: Normocephalic, atraumatic. Neck is supple. Mallampati 5-  ,  neck circumference:21. Nasal airflow patent , TMJ is  Not  evident . Retrognathia is not seen. Full facial hair.  Cardiovascular:  Regular rate and  rhythm , without  murmurs or carotid bruit, and without distended neck veins. Respiratory: Lungs are clear to auscultation. Skin:  Without evidence of edema, or rash Trunk: BMI is elevated  The patient's posture is erect   Neurologic exam : The patient is awake and alert, oriented to place and time.   Memory subjective described as intact.  Attention span & concentration ability appears normal.  Speech is fluent,  without dysarthria, dysphonia or aphasia.  Mood and affect are appropriate.  Cranial nerves: Pupils are equal and briskly reactive to light.  Pale retinae bilaterally, yellow tinged - has neovascular proliferation in both eyes, and a cupping of the optic papillae more right than left. Laser surgical scars visible.  Extraocular movements in vertical and horizontal planes intact and without nystagmus.  Hearing to finger rub intact.   Facial sensation intact to fine touch. Facial motor strength is symmetric and tongue and uvula move midline. Shoulder shrug was symmetrical.  Motor exam:  Normal tone, muscle bulk and symmetric strength in all extremities. He has less tension over the  neck, worked with PT.  Sensory:  Fine touch, pinprick and vibration/. Proprioception tested in the upper extremities was normal. Coordination: Rapid alternating movements in the fingers/hands was normal. Finger-to-nose maneuver  normal without evidence of ataxia, dysmetria or tremor.  NO TREMOR seen here, today .Gait and station: Patient walks without assistive device , but reports dizziness with postural changes .  Strength within normal limits. Stance is stable and normal.   Deep tendon reflexes: in the upper and lower extremities are symmetric and intact. Babinski maneuver response is  downgoing.  Assessment:  After physical and neurologic examination, review of laboratory studies,  Personal review of imaging studies, reports of other /same  Imaging studies, results of polysomnography and / or  neurophysiology testing and pre-existing records as far as provided in visit., my assessment is   1) possible intracranial hypertension in a young diabetic on tetracycline ( one of the most ICP producing medications) . Needs to undergo manometric LP.   2) MRI brain was normal 8 month ago -. Need to repeat for safe LP. LP with opening pressure 28 cm, having been sick for month, but visual changes since January.    3) his sleep evaluation has been completed, no apnea, mild PLMs and no hypoxemia. Snoring was mild- moderate. No CPAP.   The patient was advised of the nature of the diagnosed disorder , the treatment options and the  risks for general health and wellness arising from not treating the condition.I spent more than 35  minutes of face to face time with the patient.  Greater than 50% of time was spent in counseling and coordination of care. We have discussed the diagnosis and differential and I answered the patient's questions.    Plan:  Treatment plan and additional workup :  Note to Dr Luciana Axe.  Thank you for entrusting me with Isam's care.  Referral for Blood patch - [place]  [facility]    Dr Fransico Michael will see him on Thursday next. Dr. Belva Crome office has not contacted him - but he has a Tuesday 21 nd may 2019 follow up.    Follow up next Friday- 17th May AM.     Melvyn Novas, MD 03/12/2018, 12:30 PM  Certified in Neurology by ABPN Certified in Sleep Medicine by [facility] [address] [place], [place] [address]

## 2018-03-12 NOTE — Discharge Instructions (Signed)

## 2018-03-15 ENCOUNTER — Ambulatory Visit (INDEPENDENT_AMBULATORY_CARE_PROVIDER_SITE_OTHER): Payer: 59 | Admitting: *Deleted

## 2018-03-15 DIAGNOSIS — J309 Allergic rhinitis, unspecified: Secondary | ICD-10-CM

## 2018-03-18 ENCOUNTER — Ambulatory Visit (INDEPENDENT_AMBULATORY_CARE_PROVIDER_SITE_OTHER): Payer: 59 | Admitting: "Endocrinology

## 2018-03-18 ENCOUNTER — Encounter (INDEPENDENT_AMBULATORY_CARE_PROVIDER_SITE_OTHER): Payer: Self-pay | Admitting: "Endocrinology

## 2018-03-18 VITALS — BP 134/80 | HR 72 | Wt 260.0 lb

## 2018-03-18 DIAGNOSIS — R51 Headache: Secondary | ICD-10-CM | POA: Diagnosis not present

## 2018-03-18 DIAGNOSIS — G932 Benign intracranial hypertension: Secondary | ICD-10-CM

## 2018-03-18 DIAGNOSIS — IMO0001 Reserved for inherently not codable concepts without codable children: Secondary | ICD-10-CM

## 2018-03-18 DIAGNOSIS — E1043 Type 1 diabetes mellitus with diabetic autonomic (poly)neuropathy: Secondary | ICD-10-CM | POA: Diagnosis not present

## 2018-03-18 DIAGNOSIS — R000 Tachycardia, unspecified: Secondary | ICD-10-CM | POA: Diagnosis not present

## 2018-03-18 DIAGNOSIS — R519 Headache, unspecified: Secondary | ICD-10-CM

## 2018-03-18 DIAGNOSIS — E1029 Type 1 diabetes mellitus with other diabetic kidney complication: Secondary | ICD-10-CM | POA: Diagnosis not present

## 2018-03-18 DIAGNOSIS — E133593 Other specified diabetes mellitus with proliferative diabetic retinopathy without macular edema, bilateral: Secondary | ICD-10-CM | POA: Diagnosis not present

## 2018-03-18 DIAGNOSIS — R5383 Other fatigue: Secondary | ICD-10-CM | POA: Diagnosis not present

## 2018-03-18 DIAGNOSIS — E10649 Type 1 diabetes mellitus with hypoglycemia without coma: Secondary | ICD-10-CM | POA: Diagnosis not present

## 2018-03-18 DIAGNOSIS — E1065 Type 1 diabetes mellitus with hyperglycemia: Secondary | ICD-10-CM | POA: Diagnosis not present

## 2018-03-18 DIAGNOSIS — E1042 Type 1 diabetes mellitus with diabetic polyneuropathy: Secondary | ICD-10-CM

## 2018-03-18 DIAGNOSIS — R809 Proteinuria, unspecified: Secondary | ICD-10-CM

## 2018-03-18 DIAGNOSIS — E049 Nontoxic goiter, unspecified: Secondary | ICD-10-CM

## 2018-03-18 DIAGNOSIS — E063 Autoimmune thyroiditis: Secondary | ICD-10-CM

## 2018-03-18 DIAGNOSIS — I10 Essential (primary) hypertension: Secondary | ICD-10-CM

## 2018-03-18 DIAGNOSIS — E782 Mixed hyperlipidemia: Secondary | ICD-10-CM

## 2018-03-18 LAB — POCT GLUCOSE (DEVICE FOR HOME USE): Glucose Fasting, POC: 178 mg/dL — AB (ref 70–99)

## 2018-03-18 NOTE — Patient Instructions (Signed)
Follow up visit in 2 months.  

## 2018-03-18 NOTE — Progress Notes (Signed)
Subjective:  Patient Name: [name] Date of Birth: 1981/08/16  MRN: [id number]  [name]  presents to the office today for follow-up of his type 1 diabetes mellitus, goiter, obesity, combined hyperlipidemia, adult ADHD, fatigue, autonomic neuropathy, tachycardia, hypertension, microalbuminuria, hypoglycemia, sinusitis, bronchitis, allergies, proliferative retinopathy, and recent increased intracranial hypertension.  HISTORY OF PRESENT ILLNESS:   [name] is a 36 y.o. Caucasian gentleman. [name] was unaccompanied.  1. The patient was first referred to me on 01/08/2006 by his family physician, Dr. Roanna Epley, for evaluation and management of type 1 diabetes mellitus and related problems. The patient was 37 years old.  A. [name] had been diagnosed with type 1 diabetes somewhere in 2001-2002, at the age of 54-19. He was initially diagnosed with type 2 diabetes mellitus, but was later re-classified as type 1 diabetes mellitus. He had been followed by Dr. Arther Dames, staff endocrinologist at [facility]. [name] was started on an insulin pump approximately 18 months prior to first seeing me. His pump was a Medtronic Paradigm 712. His blood glucose control was fair-to-poor. He was frequently not checking blood sugars or taking insulin boluses as often as he needed to. He frequently noted fast heart rate. The patient's past medical history was positive for hypertension, for which he was taking lisinopril, and for what his mother called "extreme ADHD". He had previously been taking ADHD medicines but had discontinued them due to adverse effects. He had prior ankle injuries and knee injuries, to include tears of the lateral menisci bilaterally. He had not had any surgeries. He was working for a [employer] that had many different job sites in different parts of the [place] and in other countries as well. 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 He did not  use tobacco or drugs, but did drink alcohol occasionally.  B. On physical examination, his weight was 234 pounds, his height was 70 inches, his BMI was 33.2. Blood pressure was 128/70. Hemoglobin A1c was 9.5%. Heart rate was 96. He had normal affect and fair insight. He had a 25-30 gram goiter. He had normal 1+ DP pulses in his feet. He had normal sensation in his feet to touch, vibration, and monofilament. His CMP was normal except for glucose of 251. His cholesterol was 257, triglycerides 143, HDL 49, and LDL 179. TSH was 1.017, free T4 was 1.06, and free T3 was 3.3. His urinary microalbumin: creatinine ratio was 20.1 (normal less than 30).   C. The patient clearly needed better blood glucose control, which would only occur if he had better adherence to his plan. He appeared to have autonomic neuropathy, manifested by inappropriate sinus tachycardia.  His thyroid goiter suggested that he might have evolving Hashimoto's disease. He stated that he had lost some weight recently. I encouraged him to check his blood sugars more frequently and to take both correction boluses and food boluses.  2. 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 His hemoglobin A1c values have varied from 8.3-11.2%. Throughout the 12 year period, the patient's autonomic neuropathy and tachycardia have remained essentially the same. On 12/03/10 his total cholesterol was 270, triglycerides 94, HDL 50, and LDL 201. I started him on Crestor, 10 mg per day. He has since begun treatment with atorvastatin and his cholesterol  values had improved. In March 2018 he was converted to a Medtronic 670G pump and Guardian 3 CGM sensor. Since then his BGs have been much better. Unfortunately, beginning in about June 2018 he has also had a series of episodes of  bronchitis and sinusitis, muscle pains, and lethargy that presumably were due in large part to severe allergies. He has been very sick for much of the past year.  His atorvastatin and several other medications were stopped at that time.    3. The patient's last [facility] visit was on 01/13/18. At that visit we continued his current pump settings.   A. On 02/28/18 he started doxycycline for treatment of a bladder infection.   B. In the next week he saw Dr. Luciana Axe for his annual eye exam. [name] had been having more headaches for some time and had had some visual changes. Dr. Luciana Axe apparently noted worsening neovascularization and possible macular edema and sent him to neurology.   C. He saw Dr. Porfirio Mylar Dohmeier in neurology on 03/09/18, who suspected that [name] might have increased ICP. She started [name] on Diamox, 500 mg, twice daily. An LP was performed that day at the level of L2-L3. The opening pressure was high at 28 cm of water.    D. Immediately after the LP he felt as if a pressure in his head had been relieved. Later on the night after the LP, however, he had sever muscle cramping and pains as well as headache, so he went to the ED at [facility]. He was given muscle relaxants and was sent home. He saw Dr Vickey Huger again on 03/12/18. She thought that his headache was a typical post-LP headache. She told [name] to remain pretty much laying down for several days. He will see Dr. Vickey Huger again in about two weeks. His headaches and muscle aches are better now.   E. He continues to have problems with lactose intolerance. When he avoids lactose, however, he is asymptomatic. Some foods such as shellfish cause his throat to swell up.   F. He is using Humalog aspart insulin in his insulin pump. He takes losartan, 50 mg/day. He rarely has any coughing.    G. His allergy shots are going well. His allergic sinusitis has been quiescent. He is still working from home.   H. He had a sleep study on 06/18/17 that showed 1 episode  of obstructive apnea per hour and 1.6 episodes of hypopnea per hour. These findings were not felt to be significant for OSA. However, based upon the moderate amount of snoring that he did, a dental appliance was recommended. His own dentist does not do dental appliances. He has not scheduled an appointment with a different dentist.      4. Pertinent Review of Systems: Constitutional: [name] feels "better than in the past two weeks". He is still sore and achy. The Diamox makes him feel "weird", in that his head feels a bit full. If he does not take the Diamox with food, he gets nauseous. He had problems with calibration of his sensor last night, so his BGs were higher. He has been taking more ibuprofen on an empty stomach for treatment of his headaches and muscle pains  Eyes: Last exam was on 03/11/18. His retinopathy was apparently worse, as noted above. Dr. Luciana Axe will do more laser treatments once [name] recovers from his recent headache.      Neck: He has not had had any further anterior neck swelling and tenderness.  Heart: His heart  rate has been elevated at times, especially when he was having more health issues last week.  The patient has no complaints of palpitations, irregular heat beats, chest pain, or chest pressure.  Gastrointestinal: As above. He has some nausea as noted above. The patient has no other complaints of excessive hunger, heartburn, acid reflux, upset stomach, stomach aches or pains, or diarrhea. Legs: He has not had any sensation of vibration in his legs. Muscle mass and strength seem normal. There are no other complaints of numbness, tingling, burning, or pain. No edema is noted. Feet: There are no obvious foot problems now. There are no complaints of numbness, burning, or pain.  No edema is noted. Hypoglycemia: He has not been having many low BGs since reducing his ICR at 11 AM.  Emotional: He is doing better emotionally now that he is feeling better physically. He had his follow up  appointment with Dr. Piedad Climes in [place] in November 2018. Dr. Shane Crutch will wait until [name]'s overall health situation stabilizes before trying any new psych medications.     5. BG printout: We have data from the past 4 weeks. He changes sites every 2-7 days. He checks BGs 3-6 times daily, usually 5 times. Average BG is 157, compared with 152 at his last visit and with 169 at his prior visit. BGs varied from 71-289, compared with 84-221 at his last visit and with 87-292 at his prior visit. His higher BGs occurred after lunch. His BGs tend to be lower at 6 PM and midnight.   6. CGM printout: Skin glucose values and BG values correlate very well. Average SG was 144, compared with 143 at his last visit and with 148 at his prior visit  He was in auto mode 97% of the time. His highest SGs occurred around  12-4 PM. His lowest SGs occurred from 4 PM-10 PM, often associated with eating later. He has had to call Medtronic about once per month about sensor problems. Medtronic has been replacing the sensors free of charge, but he has to call for each sensor problem.     PAST MEDICAL, FAMILY, AND SOCIAL HISTORY:  Past Medical History:  Diagnosis Date  . ADHD (attention deficit hyperactivity disorder)   . Autonomic neuropathy due to diabetes (HCC)   . Diabetes (HCC)   . Fatigue   . Goiter   . High blood pressure   . High cholesterol   . Hypercholesterolemia   . Hypertension   . Hypoglycemia associated with diabetes (HCC)   . Obesity   . Tachycardia   . Type 1 diabetes mellitus not at goal [facility])   . Uncontrolled DM with microalbuminuria or microproteinuria     Family History  Problem Relation Age of Onset  . Dementia Paternal Aunt   . Schizophrenia Paternal Aunt   . Cancer Maternal Grandmother   . Diabetes Maternal Grandmother        T2 DM  . Cancer Maternal Grandfather   . Thyroid disease Neg Hx      Current Outpatient Medications:  .  acetaminophen (TYLENOL) 500 MG tablet, Take  1,000 mg by mouth 2 (two) times daily as needed., Disp: , Rfl:  .  acetaZOLAMIDE (DIAMOX SEQUELS) 500 MG capsule, Take 1 capsule (500 mg total) by mouth 2 (two) times daily., Disp: 60 capsule, Rfl: 5 .  albuterol (PROAIR HFA) 108 (90 Base) MCG/ACT inhaler, Inhale 2 puffs into the lungs every 6 (six) hours as needed for wheezing or shortness of breath.,  Disp: 1 Inhaler, Rfl: 2 .  ARNUITY ELLIPTA 200 MCG/ACT AEPB, INL 1 PUFF ITL D, Disp: , Rfl: 5 .  AUVI-Q 0.3 MG/0.3ML SOAJ injection, Use as directed for life-threatening allergic reaction., Disp: 4 Device, Rfl: 2 .  clonazePAM (KLONOPIN) 0.5 MG tablet, Take 0.5 mg by mouth 2 (two) times daily as needed., Disp: , Rfl: 0 .  cyclobenzaprine (FLEXERIL) 10 MG tablet, Take 1 tablet (10 mg total) by mouth 2 (two) times daily as needed for muscle spasms., Disp: 10 tablet, Rfl: 0 .  HUMALOG 100 UNIT/ML injection, INJECT 300 UNITS VIA INSULIN PUMP EVERY 48 TO 72 HOURS PER HYPERGLYCEMIA AND DKA PROTOCOLS., Disp: 40 mL, Rfl: 5 .  ibuprofen (ADVIL,MOTRIN) 600 MG tablet, Take 600 mg by mouth 2 (two) times daily as needed., Disp: , Rfl:  .  losartan (COZAAR) 50 MG tablet, TAKE 1 TABLET(50 MG) BY MOUTH DAILY, Disp: 30 tablet, Rfl: 4 .  montelukast (SINGULAIR) 10 MG tablet, TAKE 1 TABLET(10 MG) BY MOUTH AT BEDTIME, Disp: 30 tablet, Rfl: 4 .  ONE TOUCH ULTRA TEST test strip, Check sugar 15 x daily, Disp: 450 each, Rfl: 3 .  ONE TOUCH ULTRA TEST test strip, USE AS DIRECTED SIX TIMES DAILY, Disp: 200 each, Rfl: 5 No current facility-administered medications for this visit.   Facility-Administered Medications Ordered in Other Visits:  .  gadopentetate dimeglumine (MAGNEVIST) injection 20 mL, 20 mL, Intravenous, Once PRN, Dohmeier, Porfirio Mylar, MD  Allergies as of 03/18/2018 - Review Complete 03/12/2018  Allergen Reaction Noted  . Flonase [fluticasone] Shortness Of Breath 06/10/2017  . Sulfites Itching and Swelling 02/04/2018  . Molds & smuts  03/09/2018  . Tetracyclines  & related Other (See Comments) 03/09/2018    1. Work and Family: He is now working at home at reduced hours. 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 He hopes to return to full-time work soon. 2. Activities: He has not done much physical activity this calendar year. He was more physically active when he felt good back in November 2018.    3. Smoking, alcohol, or drugs: Occasional beer, about one-two per month. No tobacco or drugs. 4. Primary Care Provider: Dr. Guerry Bruin, MD, [facility] 5. Neurology: Dr. Vickey Huger 6. Ophthalmology: Dr. Luciana Axe 7. Urology: Dr. Annabell Howells at [facility] 8. Sports medicine: Dr. Darrick Penna  REVIEW OF SYSTEMS: There are no other significant problems involving [name]'s other body systems.   Objective:  Vital Signs:  BP 134/80   Pulse 72   Wt 260 lb (117.9 kg)   BMI 35.26 kg/m    Ht Readings from Last 3 Encounters:  03/12/18 6' (1.829 m)  03/10/18 6' (1.829 m)  03/09/18 6' (1.829 m)   Wt Readings from Last 3 Encounters:  03/18/18 260 lb (117.9 kg)  03/12/18 266 lb (120.7 kg)  03/10/18 260 lb (117.9 kg)   PHYSICAL EXAM:  Constitutional: The patient appears healthy but tired. His weight is unchanged. His affect is normal. His insight is normal. He coughed several times today. Face: The face appears normal.  Eyes: He does not have much allergic conjunctivitis. Left pupil is slightly larger then the right. Both pupils react equally to light. He has evidence of laser treatments bilaterally. Both optic discs have blurred margins. There is no obvious arcus or proptosis. Moisture appears normal.  Mouth: The oropharynx and tongue appear normal. Oral moisture is normal. Neck: The neck appears to be visibly normal. No carotid bruits are noted. He has  a short neck. His strap muscles are smaller. The thyroid gland is low lying and is again mildly enlarged at about 21 grams in size. Both lobes are mildly enlarged.  The thyroid  gland is not tender to palpation today.  Lungs: The lungs are clear to auscultation. Air movement is good.  Heart: Heart rate and rhythm are regular. Heart sounds S1 and S2 are normal. I did not appreciate any pathologic cardiac murmurs. Abdomen: The abdomen is enlarged. Bowel sounds are normal. There is no obvious hepatomegaly, splenomegaly, or other mass effect. The abdomen is somewhat tender to deep palpation laterally in both sides. Arms: Muscle size and bulk are normal for age. Hands: There is no tremor of his hands today. Phalangeal and metacarpophalangeal joints are normal. Palmar muscles are normal. Palmar skin is normal. Palmar moisture is also normal. Legs: Muscles appear normal for age. No edema is present. Feet: 1+ right DP pulse, 1+ left DP pulse. Neurologic: Strength is normal for age in both the upper and lower extremities. Muscle tone is normal. Sensation to touch is normal in both legs and both feet. He walks somewhat antalgically due to pain in his right hip.    LAB DATA:   Labs 03/18/18: CBG 178  Labs 01/13/18: HbA1c 6.8%, CBG 194; TSH 0.98, free T4 1.3; free T3 3.6; CMP normal except glucose 191; urinary microalbumin/creatinine ratio 35 (ref <30)  Labs 11/17/17: CBG 170  Labs 10/13/17: HbA1c 6.8%, CBG 191  Labs 08/24/17: CBG 117  Labs 07/20/17: CBG 216  Labs 07/08/17: IgA, IgG, and IgM normal. CBC normal.  Labs 06/16/17: HbA1c 6.5%, CBG 193  Labs 05/15/17: CBG 209; TSH 0.90, free T4 1.2, free T3 3.8  Labs 03/05/17: HbA1c 7.3%, CBG 241  Labs 02/20/17: CBG 180 fasting  Labs 01/29/17: CBG 148  Labs 01/12/17: CBG 116  Labs 12/29/16: HbA1c 9.1%, CBG 223; TSH 0.62, free T4 1.1, free T3 3.5; urine microalbumin/creatinine ratio 22; cholesterol 171, triglycerides 93, HDL 40, LDL 112;  CMP glucose 201  Labs 09/26/16: HbA1c 9.5%  Labs 08/13/16: Celiac panel negative; CMP normal except for glucose 207; CBC normal  Labs 06/13/16: HbA1c 9.8%; LH 3.6, FSH 2.3, testosterone 533  (ref [id number]), free testosterone 59.4 (ref 47-244)  Labs 02/11/16: HbA1c 9.2%  Labs 01/07/16: HbA1c 10.5%; CBC with Hgb elevated at 17.2%; LH 3.9, FSH 2.0; TSH 1.53, free T4 1.3, free T3 3.7; cholesterol 262, triglycerides 171, HDL 40, LDL 188; urinary microalbumin/creatinine ratio 31; CMP normal except for glucose 181  Labs 06/29/15. HbA1c 9.6%  Labs 12/28/14: Hemoglobin A1c 9.5% today, compared with 9.5%, at last visit and with 9.9% at the prior visit.  Labs 08/03/14: HbA1c 9.5%; TSH 0.548,free T4 1.10, free T3 3.7, TPO antibody < 1, TSI 30; CMP normal except glucose 191; urinary microalbumin/creatinine ratio 27; cholesterol 260, triglycerides 90, HDL 51, LDL 191   Labs 10/05/13: HbA1c 9.9%  Labs 04/23/13: CMP normal, except glucose 144; cholesterol 216, triglycerides 97, HDL 39, LDL 158; urinary microalbumin/creatinine ratio 6.9; TSH 0.505, free T4 1.17, free T3 3.8  Labs: 01/21/12: TSH 1.251, free T4 1.10, free T3 3.6, CBC normal, CMP normal except for glucose 206, urinary microalbumin/creatinine ratio 6.3, cholesterol 187, triglycerides 67, HDL 45, LDL 129   Assessment and Plan:   ASSESSMENT:  1. T1DM:   A. His BGs are doing quite well overall, but still vary with whether or not he has active allergic rhinitis/sinusitis/bronchitis, whether or not he feels well enough to exercise, and how long he  keeps his sites in place. Almost all of his higher BGs in the past month were due to keeping his sites in too long. The combination of the Medtronic 670G pump and Guardian 3 sensor is again helping him achieve good BG control.   B. Dr. Vickey Huger was worried that Diamox might cause erroneous BG or SG values. Thus far his SGs and BGs have correlated well. I will look into this issue more with Medtronic and with One-touch test strips. .  2. Hypoglycemia: He has had 1 SG <70 and one BG <80, a 71. His low SG and low BG occurred at 3 AM one morning.    3. Hypertension: SBP and DBP are still somewhat high.  He needs to continue to take meds regularly and to exercise regularly when he feels well enough to do so.  4. Combined hyperlipidemia: His lipids in March 2017 were too high, but the lipids in March 2018 were much better on atorvastatin. He discontinued the atorvastatin in mid-2018 due to concern that this medication might be causing his neuromuscular symptoms. We will plan to re-start the atorvastatin in the near future once he becomes stable on immunotherapy. We will repeat his lipid panel at that time.  5. Autonomic neuropathy with tachycardia and gastroparesis: His heart rate has normalized. His BMs have also normalized. 6. Obesity: Weight has been stable, but is still too high. Eating Right and exercise will help. increased again in the past month.  He needs fewer calories and more exercise.      7-8. Goiter/thyroiditis:   A. His thyroid gland is still somewhat enlarged. His thyroiditis is clinically quiescent. The active thyroiditis that he has had in the past and the  process of waxing and waning of thyroid gland size are c/w evolving Hashimoto's thyroiditis.   B. He was euthyroid in March 2013, but was borderline hyperthyroid in June 2014. He was euthyroid in October 2015, March 2017, March 2018, July 2018, and in March 2019.  9. Fatigue: This problem has significantly improved.  10. Obstructive sleep apnea: He did not meet the criteria for OSA, but because of his snoring a dental appliance was suggested. I recommended that he obtain a dental appliance. Unfortunately, his former dentist no longer accepts [employer], so [name] has to find a new dentist. 11. Adult ADD: He had an appointment with Dr. Piedad Climes, MD in [place] in November 2018. Dr. Shane Crutch is waiting for [name]'s medical situation to stabilize before beginning any new psych meds.   12. Proliferative diabetic retinopathy: This problem is due to his long period of poorly controlled BGs. The retinopathy was worse earlier  this month. It will take at least 6-12 months of near-normal BGs for his retinopathy to stabilize.  He will have follow up exam soon.  13. URI/nasal congestion/vertigo/allergic rhinitis and conjunctivitis: He has much less allergic rhinitis and conjunctivitis since starting immunotherapy.    14. Neuromuscular symptoms and headaches:   A. It appeared previously that some of [name]'s headaches were classic tension headaches. When he performed cervical stretching exercises the headache resolved.   B. In the past when Dr. Vickey Huger evaluated [name] for his vague neuromuscular symptoms, she did not find any signs of any serious neurologic disease. His MRI of his brain was unremarkable. At that time his neuromuscular symptoms had resolved.  C. In the past few months, however, the headaches worsened and some of the neuromuscular symptoms recurred. We now know that he had increased ICP and that  his headaches improved for a brief time after the LP. He then developed a typical spinal tap headache.  D. In retrospect he may have had intermittent problems with elevated ICP previously.  15. Calcified lymph node: It is possible that he may have contracted a fungus while he was working in the [employer] several years ago. His thyroid US showed his goiter, but no intrinsic thyroid abnormality. A partially calcified cervical lymph node on the right was noted, c/w a post infections/inflammatory node. 16. Microalbuminuria: His ratio was mildly elevated in march. He needs tighter BG and BP control. 17. Elevated Intracranial pressure (ICP): Dr. Vickey Huger suspects that [name]'s ICP was due to the doxycycline. She will follow this issue over time.   PLAN:  1. Diagnostic:  Repeat CBG  today. Call in two weeks on a Sunday or Wednesday evening to discuss BGs.  2. Therapeutic: Follow DM care plan. Continue losartan. Continue his current pump basal rates, ISFs, and BG targets.   Continue current ICRs and other pump settings:  MN:  7 8 AM: 5.00 11 AM: 4.5 9 PM: 7 3. Patient education: We discussed the significant improvements in his allergy-related symptoms since his last visit. We also discussed his recent increased ICP. We discussed the need to make small changes in his ICRs based upon his eating habits and exercise habits. We also discussed the need for him to continue to be followed closely by Drs Wylene Simmer, Kozlow, Dohmeier, [place], [place], [place], and [place].  4. Follow-up:  2 months  Level of Service: This visit lasted in excess of 115 minutes. More than 50% of the visit was devoted to counseling.  Molli Knock, MD, CDE Adult and Pediatric Endocrinology  03/18/2018 10:42 AM

## 2018-03-19 ENCOUNTER — Telehealth: Payer: Self-pay | Admitting: Neurology

## 2018-03-19 NOTE — Telephone Encounter (Signed)
Joseph Pippin, MD  [name], Joseph Mylar, MD        Thanks,  I've been out of town and don't usually check Epic since we are on a different EMR in the office.  He is seeing one of our nurse practitioners tomorrow and I have messaged him about the problem with the doxycycline. One complex fellow.  Can't say have seen that problem before.   Previous Messages    ----- Message -----  From: Melvyn Novas, MD  Sent: 03/09/2018  5:18 PM  To: Joseph Pippin, MD, David Stall, MD  Subject: intracranial pressure elevation/ Pseudotumor*   Dear Casimiro Needle and Jonny Ruiz  This young man presented with papilloedema of both eyes, worse on the right, headaches and vision changes.  He was found to have an opening pressure of 28 cm H20 on LP today, started diamox, I d/c his tetracycline.   DR Fransico Michael: I have had patients whose insulin pump or glucometer strips didn't work properly on Diamox and wanted you to be aware of the new medication.   Dr.Wrenn , I also need you to be aware of the d/c antibiotic - he was taking it for UTI and needs to change his ATB.   Melvyn Novas, MD

## 2018-03-19 NOTE — Telephone Encounter (Signed)
-----   Message from Bjorn Pippin, MD sent at 03/15/2018  7:59 PM EDT ----- Regarding: RE: intracranial pressure elevation/ Pseudotumor cerebri  Thanks,   I've been out of town and don't usually check Epic since we are on a different EMR in the office.   He is seeing one of our nurse practitioners tomorrow and I have messaged him about the problem with the doxycycline.  One complex fellow.   Can't say have seen that problem before.    ----- Message ----- From: Melvyn Novas, MD Sent: 03/09/2018   5:18 PM To: Bjorn Pippin, MD, David Stall, MD Subject: intracranial pressure elevation/ Pseudotumor#  Dear Casimiro Needle and Jonny Ruiz  This young man presented with papilloedema of both eyes, worse on the right, headaches and vision changes.  He was found to have an opening pressure of 28 cm H20 on LP today, started diamox, I d/c his tetracycline.   DR Fransico Michael: I have had patients whose insulin pump or glucometer strips didn't work properly on Diamox and wanted you to be aware of the new medication.    Dr.Wrenn , I also need you to be aware of the d/c antibiotic - he was taking it for UTI and needs to change his ATB.   Melvyn Novas, MD

## 2018-03-20 DIAGNOSIS — G932 Benign intracranial hypertension: Secondary | ICD-10-CM | POA: Insufficient documentation

## 2018-03-23 ENCOUNTER — Ambulatory Visit (INDEPENDENT_AMBULATORY_CARE_PROVIDER_SITE_OTHER): Payer: 59 | Admitting: *Deleted

## 2018-03-23 ENCOUNTER — Telehealth: Payer: Self-pay | Admitting: Neurology

## 2018-03-23 DIAGNOSIS — J309 Allergic rhinitis, unspecified: Secondary | ICD-10-CM

## 2018-03-23 NOTE — Telephone Encounter (Signed)
Called the pt and was able to offer him a apt on thur at 1:30 pm with check in of 1 pm. Pt verbalized understanding and would like to take that apt. I informed the pt I will not cancel the aug apt in case Dr Vickey Huger still wanted him to follow up at that time frame.

## 2018-03-23 NOTE — Telephone Encounter (Signed)
Patient needing sooner apt than sched in August. Has questions about medication. Best call back [phone]

## 2018-03-25 ENCOUNTER — Encounter: Payer: Self-pay | Admitting: Neurology

## 2018-03-25 ENCOUNTER — Ambulatory Visit: Payer: 59 | Admitting: Neurology

## 2018-03-25 VITALS — BP 133/86 | HR 106 | Ht 72.0 in | Wt 256.0 lb

## 2018-03-25 DIAGNOSIS — R0683 Snoring: Secondary | ICD-10-CM | POA: Diagnosis not present

## 2018-03-25 DIAGNOSIS — T7029XD Other effects of high altitude, subsequent encounter: Secondary | ICD-10-CM

## 2018-03-25 DIAGNOSIS — G932 Benign intracranial hypertension: Secondary | ICD-10-CM | POA: Insufficient documentation

## 2018-03-25 DIAGNOSIS — T7020XD Unspecified effects of high altitude, subsequent encounter: Secondary | ICD-10-CM | POA: Insufficient documentation

## 2018-03-25 DIAGNOSIS — Z6841 Body Mass Index (BMI) 40.0 and over, adult: Secondary | ICD-10-CM | POA: Diagnosis not present

## 2018-03-25 DIAGNOSIS — Z79899 Other long term (current) drug therapy: Secondary | ICD-10-CM | POA: Diagnosis not present

## 2018-03-25 MED ORDER — ACETAZOLAMIDE ER 500 MG PO CP12: 500.0000 mg | ORAL_CAPSULE | Freq: Two times a day (BID) | ORAL | 5 refills | Status: DC

## 2018-03-25 NOTE — Patient Instructions (Signed)
Altitude Sickness Altitude sickness happens when you go to a high place (high altitude) without giving your body time to get used to it as you go higher and higher. Altitude sickness can be an emergency. It can be life-threatening. Follow these instructions at home:  If you have to exercise, do only light exercise for the first 24-36 hours after treatment.  Drink enough fluids to keep your pee (urine) clear or pale yellow.  Eat small, light meals.  Avoid: ? Any tobacco products, such as cigarettes, chewing tobacco, or e-cigarettes. ? Taking calming medicines (sedatives). ? Alcohol.  Stay in low places.  Have someone stay with you until you feel normal.  Keep all follow-up visits as told by your doctor. This is important. How is this prevented?  Go to high places slowly.  Go to high places during the day and go back to low places at night.  Before you do physical activities, give your body a few days to get used to being in high places.  Ask your doctor about medicines you can take to prevent altitude sickness. Get help right away if:  You have: ? Chest pain or tightness. ? A fast heartbeat. ? A very bad headache. ? A very bad cough. ? Trouble walking. ? Trouble focusing your mind (concentrating).  You feel: ? Very short of breath. ? Confused. This information is not intended to replace advice given to you by your health care provider. Make sure you discuss any questions you have with your health care provider. Document Released: 01/05/2012 Document Revised: 10/16/2016 Document Reviewed: 12/12/2011 Elsevier Interactive Patient Education  2018 Elsevier Inc.  

## 2018-03-25 NOTE — Progress Notes (Signed)
SLEEP MEDICINE CLINIC   Provider:  Melvyn Novas, M D  Primary Care Physician:  Gaspar Garbe, MD   Referring Provider: ophthalmologist Dr. Luciana Axe, who is also a retina specialist.   The patient is further followed by endocrinologist Dr. Molli Knock since he is diabetic type I.   Chief Complaint  Patient presents with  . Follow-up    pt alone, rm 11 pt states in last visit he was unable to truly understand what his treatment plan is. he states he feels much better. he has questions about if it is safe to fly, vision is better not seeing green as often as he was.     HPI: Interval history from 25 Mar 2018, [name] is here today for follow-up visit, he has tolerated the LP very well, his opening pressure was elevated he was asked to discontinue the tetracycline medication and instead started on Diamox.  He reports that both headaches and vision have been much improved, sometimes in the morning he still has a hazy greenish tinge to his visual field the dose away once he takes his medication.  Much less frequent has since been observed in the afternoon or evenings.  He has not to take another antibiotic as his urine sample to look clear and his urologist felt that he was not in need of further therapy. Dr Fransico Michael checked his glucometer readings against his pump, and they tracked well. He plans to travel by plane soon and had additional questions about the ICP and diamox. No longer ptosis on the right - resolved right after LP. He has not returned to the gym- where he noted both ptosis and greenish vision.  He was sick after a trip to [place]- explained by altitude exacerbation.  Sleep apnea    Avoiding tetracycline, retinoid, high dose vit A . Avoid high altitudes without diamox premedication.   use diamox defore you fly .     03-12-2018,   I have the pleasure of seeing [name] today in a follow-up visit.  I had seen the patient on 14 May when he presented  with very concerning data from his ophthalmologist, retro-orbital pressure was identified, and he was from here referred after physical and neurological exam for an immediate fluoroscopic lumbar puncture.  This was performed at 1 PM at [facility], no he was [name] hospital location, a successful nontraumatic spinal tap was obtained, between the second and third lumbar vertebra opening pressure was 28 cmH2O.  Except for an elevated glucose level in the CSF there was no other abnormality, no elevated protein no white blood cells or red cells and all cultures thus far have been negative.  After the spinal tap developed abdominal almost spasms, as well as cramping at the chest wall muscles.  By day 3 post spinal tap he has developed typical spinal tap headaches that arise when he sits up or walks sometimes needing about 10 to 15 minutes.  He feels much better laying flat and laying reclining.  In order to treat the spinal tap headaches I will send him for a blood patch, urinary tract infection was ruled out his urinary sample to clean I was concerned since I discontinued his antibiotics.  I still think that the tetracyclines probably tipped the intracranial pressure.  He saw Dr. Luciana Axe his retinal specialist yesterday, they were pleased with the results as to his retinal changes, any further procedure from ophthalmology will be pending until gait feels better has less headaches  and discomfort.  I also started Diamox at 500 mg twice daily should he developed gastrointestinal side effects from this medication I would like in that case to reduce it to 250 in the morning 250 at night and we can still advance the dose later on after a couple of weeks or so.    03-09-2018, Patient returned for an urgent visit based on vision changes and diabetic changes to the eye, seen by Dr. Luciana Axe.  He developed headaches , changes to green tinged vision, some tremor and HA are worse when he in bed, better when he rises.  Dynamic intracranial pressure changes He gets nauseated, and worse vision changes when moving. He feels a vibration and tension in the neck and pressure behind the right eye more than left, he sleeps on his right. History of chronic sinusitis, with tinnitus since 10/ 2017 . He tested negative for OSA. He had initially improved in HA, tinnitus and Vertigo. Tremor could not be objectively seen.  He has been a diabetic Type 1, followed by Dr.  Molli Knock, with an insulin pump.  MRI brain and sinus CT in the last 6 month. Seen by ENT and allergy specialist.  His upper respiratory infects have decreased, since allergy treatment.   Topical vancomycin and extensive notes over, the last visit with the patient was on May 9 Thursday 2019, the patient had been treated with Avastin injections into the left and right eye, beginning in August 2018 the last right eye injection was on November 09, 2017.  Patient is currently on doxycycline, Arnuity, Humalog insulin, Klonopin, losartan and montelukast.  Impression was that the patient has controlled diabetes type 1 diabetes mellitus with right eye affected by proliferative retinopathy and macular edema Dr. Luciana Axe documented that he felt this was worsening.  He also stated the same for the left eye also to a slightly lesser degree.  There is also a minor cataract forming in both eyes, there is optic papillitis which has not been graded, ocular pain, and a history of acute sinusitis and retinal hemorrhage he mentions the patient snores and has headaches with valsalva-.  Transient obscuration of vision in both eyes- last  MRI scan was normal at the time/ 2018.   Headaches much worse when bending over, body habitus and doxycycline medication may cause this patient's likely intracranial pressure elevation.  ENT cleared him of sinusitis: CLINICAL DATA:  Chronic sinusitis   08-31-2017, [name] is seen here today for a follow-up visit after his sleep study from  06/18/2017.  The patient again had such a mild degree of apnea that it does not need to be  treated with any intervention.  His AHI was only 2.6/h not considered pathologically.  His REM sleep AHI was 14.3/h and supine AHI 3.4.  We meet today to also look at his sleep experience since the study.  The patient reports that he still wakes up with trembling in both arms, he has shoulder discomfort which keeps him from sleeping on his side.  However in intervention with CPAP, PLM medication or oxygen is not necessary at this point.  I do not have a good understanding as to why he trembles, and why these trembling spells wake him from sleep.  He has been followed closely for his thyroid function and reports that it has been in the high normal range.  He still reports some palpitations that he experiences at night.  He also reports facial twitching at night. He related the onset to  psychiatric medication ( wellbutrin, trazodone). He has year around asthma and allergies.  I have no interventional therapies available.    CONSULT : SLEEP CLINIC:  [name] is a 37 y.o. male , seen here as in a referral/ revisit  from sports medicine and Dr Fransico Michael, ped endocrinologist.  I would like to thank Dr. Fransico Michael for his report notes which helped me today to design further sleep evaluation and treatment for this patient. He also gave me an excellent summary of the past medical problems that [name] has faced. Mr.  [name] was diagnosed with diabetes at age 12 and has been followed by a pediatric endocrinologist, Dr. Fransico Michael. His blood sugars have been better and more stable on an auto mode Medtronic insulin pump. He also faces problems with obesity, hypertension, hyperlipidemia and an autonomic neuropathy with tachycardia and gastroparesis. As blood pressure control had improved so did his heart rate and his postprandial bloating. He has been treated for thyroiditis consistent with an evolving Hashimoto's ultra immune  thyroid disease. He was euthyroid again in March 2018 when last checked he had reported fatigue and was diagnosed by a home sleep test last year at " [facility]", was  supposedly diagnosed with obstructive sleep apnea but had not treatment initiated .  He has proliferative retinopathy, attributed to diabetes. Dr. Fransico Michael also wanted to make sure that current rather newish neuromuscular symptoms beI evaluated -he was especially concerned that this patient is prone to further auto-immune diseases such as central demyelination or multiple sclerosis. Dr. Darrick Penna in this sport medicine section of [place] had added in this visit on 04/15/2007 but the patient habits and improvement of nausea, vertigo, after discontinuing eyedrops, trazodone and Wellbutrin and atorvastatin. He had more headaches. He felt jittery and tremors of his right hand and right leg were temporary worse. He describes photophobia. He felt shaking more in the morning l right hand and right leg, an inner jitteriness. It has improved. Vertigo not present today.    Sleep habits are as follows:  " Bedtime between 10 and 12 midnight, rises at 7 AM. He describes his sleep is rather restless, trying to find a comfortable sleep position, but she doesn't necessarily use the bed during the night. He does not report nocturia, and only twice or so in the last month has he woke up with palpitations or clamminess, likely related to hypoglycemia. He is known to snore, and he had a home sleep test at [facility] last year in [facility] and was told that he has obstructive sleep apnea. Treatment was not initiated yet. He wakes up not feeling restored or refreshed, and he has noticed that his jittery illness affecting the right extremities and a feeling of inner trembling of his torso is the worst in the morning hours. It doesn't last that long. He also has a pulsating change of vision when he first gets up and he suffered from vertigo for much of the  last 3 months. No daytime naps, he wouldn't sleep at night if he did.   Sleep medical history and family sleep history:  Dad snoring, mom is an insomniac. He remembers having problems sleeping even in childhood. He has felt fatigued and not restored for several months. Social history: works as Games developer. The patient is single, no children. No tobacco use, seldom alcohol use,  4 months ago he stopped drinking caffeine due to the jitteriness, and headaches with photophobia and a feeling of the head being full  could also be relating to sinusitis.    Review of Systems: Out of a complete 14 system review, the patient complains of only the following symptoms, and all other reviewed systems are negative.  Patient has shoulder pain and trembling in both arms, feels tight, achy. Not shooting pains, not electric sensation. Soreness   Epworth score 5, Fatigue severity score 30 , depression score n/a    Social History   Socioeconomic History  . Marital status: Single    Spouse name: Not on file  . Number of children: Not on file  . Years of education: Not on file  . Highest education level: Not on file  Occupational History  . Not on file  Social Needs  . Financial resource strain: Not on file  . Food insecurity:    Worry: Not on file    Inability: Not on file  . Transportation needs:    Medical: Not on file    Non-medical: Not on file  Tobacco Use  . Smoking status: Never Smoker  . Smokeless tobacco: Never Used  Substance and Sexual Activity  . Alcohol use: No  . Drug use: No  . Sexual activity: Not on file  Lifestyle  . Physical activity:    Days per week: Not on file    Minutes per session: Not on file  . Stress: Not on file  Relationships  . Social connections:    Talks on phone: Not on file    Gets together: Not on file    Attends religious service: Not on file    Active member of club or organization: Not on file    Attends meetings of clubs or  organizations: Not on file    Relationship status: Not on file  . Intimate partner violence:    Fear of current or ex partner: Not on file    Emotionally abused: Not on file    Physically abused: Not on file    Forced sexual activity: Not on file  Other Topics Concern  . Not on file  Social History Narrative   ** Merged History Encounter **        Family History  Problem Relation Age of Onset  . Dementia Paternal Aunt   . Schizophrenia Paternal Aunt   . Cancer Maternal Grandmother   . Diabetes Maternal Grandmother        T2 DM  . Cancer Maternal Grandfather   . Thyroid disease Neg Hx     Past Medical History:  Diagnosis Date  . ADHD (attention deficit hyperactivity disorder)   . Autonomic neuropathy due to diabetes (HCC)   . Diabetes (HCC)   . Fatigue   . Goiter   . High blood pressure   . High cholesterol   . Hypercholesterolemia   . Hypertension   . Hypoglycemia associated with diabetes (HCC)   . Obesity   . Tachycardia   . Type 1 diabetes mellitus not at goal [facility]   . Uncontrolled DM with microalbuminuria or microproteinuria     Past Surgical History:  Procedure Laterality Date  . none    . WISDOM TOOTH EXTRACTION      Current Outpatient Medications  Medication Sig Dispense Refill  . acetaminophen (TYLENOL) 500 MG tablet Take 1,000 mg by mouth 2 (two) times daily as needed.    [name] acetaZOLAMIDE (DIAMOX SEQUELS) 500 MG capsule Take 1 capsule (500 mg total) by mouth 2 (two) times daily. 60 capsule 5  . albuterol (PROAIR HFA) 108 (90 Base)  MCG/ACT inhaler Inhale 2 puffs into the lungs every 6 (six) hours as needed for wheezing or shortness of breath. 1 Inhaler 2  . ARNUITY ELLIPTA 200 MCG/ACT AEPB INL 1 PUFF ITL D  5  . AUVI-Q 0.3 MG/0.3ML SOAJ injection Use as directed for life-threatening allergic reaction. 4 Device 2  . clonazePAM (KLONOPIN) 0.5 MG tablet Take 0.5 mg by mouth 2 (two) times daily as needed.  0  . cyclobenzaprine (FLEXERIL) 10 MG tablet Take 1  tablet (10 mg total) by mouth 2 (two) times daily as needed for muscle spasms. 10 tablet 0  . HUMALOG 100 UNIT/ML injection INJECT 300 UNITS VIA INSULIN PUMP EVERY 48 TO 72 HOURS PER HYPERGLYCEMIA AND DKA PROTOCOLS. 40 mL 5  . ibuprofen (ADVIL,MOTRIN) 600 MG tablet Take 600 mg by mouth 2 (two) times daily as needed.    [name] losartan (COZAAR) 50 MG tablet TAKE 1 TABLET(50 MG) BY MOUTH DAILY 30 tablet 4  . montelukast (SINGULAIR) 10 MG tablet TAKE 1 TABLET(10 MG) BY MOUTH AT BEDTIME 30 tablet 4  . ONE TOUCH ULTRA TEST test strip Check sugar 15 x daily 450 each 3  . ONE TOUCH ULTRA TEST test strip USE AS DIRECTED SIX TIMES DAILY 200 each 5   No current facility-administered medications for this visit.    Facility-Administered Medications Ordered in Other Visits  Medication Dose Route Frequency Provider Last Rate Last Dose  . gadopentetate dimeglumine (MAGNEVIST) injection 20 mL  20 mL Intravenous Once PRN [name], Porfirio Mylar, MD        Allergies as of 03/25/2018 - Review Complete 03/25/2018  Allergen Reaction Noted  . Flonase [fluticasone] Shortness Of Breath 06/10/2017  . Sulfites Itching and Swelling 02/04/2018  . Molds & smuts  03/09/2018  . Tetracyclines & related Other (See Comments) 03/09/2018    Vitals: BP 133/86   Pulse (!) 106   Ht 6' (1.829 m)   Wt 256 lb (116.1 kg)   BMI 34.72 kg/m  Last Weight:  Wt Readings from Last 1 Encounters:  03/25/18 256 lb (116.1 kg)   AVW:UJWJ mass index is 34.72 kg/m.     Last Height:   Ht Readings from Last 1 Encounters:  03/25/18 6' (1.829 m)    Physical exam:  General: The patient is awake, alert and appears not in acute distress. The patient is well groomed. Head: Normocephalic, atraumatic. Neck is supple. Mallampati 5-  ,  neck circumference:21. Nasal airflow patent , TMJ is  Not  evident . Retrognathia is not seen. Full facial hair.  Cardiovascular:  Regular rate and rhythm , without  murmurs or carotid bruit, and without distended neck  veins. Respiratory: Lungs are clear to auscultation. Skin:  Without evidence of edema, or rash Trunk: BMI is elevated  The patient's posture is erect   Neurologic exam : The patient is awake and alert, oriented to place and time.   Memory subjective described as intact.  Attention span & concentration ability appears normal.  Speech is fluent,  without dysarthria, dysphonia or aphasia.  Mood and affect are appropriate.  Cranial nerves: Pupils are equal and briskly reactive to light.  Pale retinae bilaterally, yellow tinged - has neovascular proliferation in both eyes, and a cupping of the optic papillae more right than left. Laser surgical scars visible.  Extraocular movements in vertical and horizontal planes intact and without nystagmus.  Hearing to finger rub intact.   Facial sensation intact to fine touch. Facial motor strength is symmetric and tongue and  uvula move midline. Shoulder shrug was symmetrical.  Motor exam:  Normal tone, muscle bulk and symmetric strength in all extremities. He has less tension over the neck, worked with PT.  Sensory:  Fine touch, pinprick and vibration/. Proprioception tested in the upper extremities was normal. Coordination: Rapid alternating movements in the fingers/hands was normal. Finger-to-nose maneuver  normal without evidence of ataxia, dysmetria or tremor.  NO TREMOR seen here, today .Gait and station: Patient walks without assistive device , but reports dizziness with postural changes .  Strength within normal limits. Stance is stable and normal.   Deep tendon reflexes: in the upper and lower extremities are symmetric and intact. Babinski maneuver response is  downgoing.  Assessment:  After physical and neurologic examination, review of laboratory studies,  Personal review of imaging studies, reports of other /same  Imaging studies, results of polysomnography and / or neurophysiology testing and pre-existing records as far as provided in visit., my  assessment is   1) possible intracranial hypertension in a young diabetic on tetracycline ( one of the most ICP producing medications) . Needs to undergo manometric LP.   2) MRI brain was normal 8 month ago -. Need to repeat for safe LP. LP with opening pressure 28 cm, having been sick for month, but visual changes since January.    3) his sleep evaluation has been completed, no apnea, mild PLMs and no hypoxemia. Snoring was mild- moderate. No CPAP.   The patient was advised of the nature of the diagnosed disorder , the treatment options and the  risks for general health and wellness arising from not treating the condition.I spent more than 35  minutes of face to face time with the patient.  Greater than 50% of time was spent in counseling and coordination of care. We have discussed the diagnosis and differential and I answered the patient's questions.    Plan:  Treatment plan and additional workup :  Note to Dr Luciana Axe.  Thank you for entrusting me with Quron's care.  Referral for Blood patch - [facility]    Dr Fransico Michael will see him on Thursday next. Dr. Belva Crome office has not contacted him - but he has a Tuesday 21 nd may 2019 follow up.    Follow up next Friday- 17th May AM.     Melvyn Novas, MD 03/25/2018, 1:58 PM  Certified in Neurology by ABPN Certified in Sleep Medicine by [facility] [address] [place], [place] [address]

## 2018-03-26 ENCOUNTER — Telehealth: Payer: Self-pay | Admitting: Neurology

## 2018-03-26 LAB — ELECTROLYTE PANEL
CO2: 18 mmol/L — AB (ref 20–29)
Chloride: 105 mmol/L (ref 96–106)
Potassium: 4.3 mmol/L (ref 3.5–5.2)
SODIUM: 137 mmol/L (ref 134–144)

## 2018-03-26 NOTE — Telephone Encounter (Signed)
Called and reviewed the lab work with the patient. Pt verbalized understanding. Pt had no questions at this time but was encouraged to call back if questions arise.

## 2018-03-26 NOTE — Telephone Encounter (Signed)
Pt called after hours call line c/o bifrontal HA that started yesterday. I talked to him, he has taken tylenol, pain similar but not as severe as his post LP HA for which he had a blood patch last week. He has not been hydrating as well as he should he admits.  I recommended: prn tylenol, oral hydration, caffeine, such as coffee or soda. He denies any neurological accompaniments currently, but is advised that should his pain become worse or he develop any new neurological Sx he proceed to UC or ER. He demonstrated understanding and voiced agreement.

## 2018-03-26 NOTE — Telephone Encounter (Signed)
-----   Message from Melvyn Novasarmen Dohmeier, MD sent at 03/26/2018 10:43 AM EDT ----- Low CO2 is a result of DIAMOX expected.

## 2018-03-26 NOTE — Progress Notes (Signed)
I have send [name] to a sleep specialist dentist ( Dr Toni ArthursFuller)  .The symptoms of elevated ICP have improved, but he reported some " greenish " tinge to his vision. I am familiar with Diamox causing a yellow /ornage tinge and this was new to me. It lasts briefly, not even a minute in AM before he takes his morning dose.  I decided not to up his dose and leave him on 500 mg bid, asked him to take medication 2 hours before a flight and always when travelling to higher altitudes- if he has ICP symptoms, he may need higher doses on those occassions.        Charlisha Market, MD

## 2018-03-29 NOTE — Telephone Encounter (Signed)
Thank you, [name]

## 2018-03-30 ENCOUNTER — Ambulatory Visit (INDEPENDENT_AMBULATORY_CARE_PROVIDER_SITE_OTHER): Payer: 59 | Admitting: *Deleted

## 2018-03-30 DIAGNOSIS — J309 Allergic rhinitis, unspecified: Secondary | ICD-10-CM | POA: Diagnosis not present

## 2018-03-31 ENCOUNTER — Other Ambulatory Visit (INDEPENDENT_AMBULATORY_CARE_PROVIDER_SITE_OTHER): Payer: Self-pay | Admitting: "Endocrinology

## 2018-03-31 ENCOUNTER — Other Ambulatory Visit: Payer: Self-pay | Admitting: Allergy and Immunology

## 2018-03-31 DIAGNOSIS — IMO0001 Reserved for inherently not codable concepts without codable children: Secondary | ICD-10-CM

## 2018-03-31 DIAGNOSIS — E1065 Type 1 diabetes mellitus with hyperglycemia: Principal | ICD-10-CM

## 2018-04-06 ENCOUNTER — Ambulatory Visit (INDEPENDENT_AMBULATORY_CARE_PROVIDER_SITE_OTHER): Payer: 59 | Admitting: *Deleted

## 2018-04-06 DIAGNOSIS — J309 Allergic rhinitis, unspecified: Secondary | ICD-10-CM

## 2018-04-13 ENCOUNTER — Ambulatory Visit (INDEPENDENT_AMBULATORY_CARE_PROVIDER_SITE_OTHER): Payer: 59 | Admitting: *Deleted

## 2018-04-13 DIAGNOSIS — J309 Allergic rhinitis, unspecified: Secondary | ICD-10-CM

## 2018-04-14 ENCOUNTER — Telehealth (INDEPENDENT_AMBULATORY_CARE_PROVIDER_SITE_OTHER): Payer: Self-pay | Admitting: "Endocrinology

## 2018-04-14 NOTE — Telephone Encounter (Signed)
[name] called to discuss BGs. 1. Overall status: Things are going pretty good. Dr. Vickey Hugerohmeier gave him a referral for a dental mouthpiece for his OSA. She continued him on the same dose of Diamox.  She will continue to follow him for his increased intracranial pressure.  2. New problems: He has been having more constipation on and off. He has abdominal cramping when he needs to have BM. His BGs go lower after meals if he takes a bolus immediately after the meal. Now he waits until after the BGs increase before bolusing. 3. Last site change this afternoon. 4. Rapid-acting insulin: Humalog in his Medtronic 670G pump. Pump and sensor seem to be fine. 5. BG log: 2 AM, Breakfast, Lunch, Supper, Bedtime 04/12/18: 152, 121, 164/144, 130/lower BGs/site change, 205 04/13/18: xxx, 140, 129, 154, 188 04/14/18: 167, xxx, 136/changed sensor/305, 144, pending 6. Assessment:   A. Overall the BGs are doing well. His 305 occurred when he was without sensor input for several hours.   B. His constipation may be causing some delayed gastric emptying.  7. Plan: He could try either Metamucil or Miralax.  8. FU call: 3 weeks on a Wednesday evening  Molli KnockMichael Brigham Cobbins, MD, CDE

## 2018-04-19 ENCOUNTER — Telehealth: Payer: Self-pay | Admitting: Neurology

## 2018-04-19 NOTE — Telephone Encounter (Signed)
[facility] her Baird LyonsCasey would have to handle this. She may have to do that when Dr. Vickey Hugerohmeier is back in the office. The pt already had a sleep consult. Dr. Vickey Hugerohmeier wanted the pt to have another sleep study however insurance denied it due to pt having a sleep study on 06/18/17 with Dr. Vickey Hugerohmeier. I am not sure how Dr. Vickey Hugerohmeier wants to processed with this.

## 2018-04-19 NOTE — Telephone Encounter (Signed)
Joseph Toni ArthursFuller [phone] called to advise they do a 2 day sleep study and is wanting to know how Dr Dohmeier wants to handle this? She said the pt advised the sleep study referral here was having insurance issues with getting it approved. Please call to advise. [name], I forgot Dr Vickey Hugerohmeier was not in the office.

## 2018-04-23 ENCOUNTER — Ambulatory Visit (INDEPENDENT_AMBULATORY_CARE_PROVIDER_SITE_OTHER): Payer: 59

## 2018-04-23 DIAGNOSIS — J309 Allergic rhinitis, unspecified: Secondary | ICD-10-CM | POA: Diagnosis not present

## 2018-04-26 ENCOUNTER — Ambulatory Visit (INDEPENDENT_AMBULATORY_CARE_PROVIDER_SITE_OTHER): Payer: 59 | Admitting: Family Medicine

## 2018-04-26 ENCOUNTER — Encounter: Payer: Self-pay | Admitting: Family Medicine

## 2018-04-26 VITALS — BP 128/76 | HR 90 | Temp 97.8°F | Resp 19

## 2018-04-26 DIAGNOSIS — J3089 Other allergic rhinitis: Secondary | ICD-10-CM | POA: Insufficient documentation

## 2018-04-26 DIAGNOSIS — J4541 Moderate persistent asthma with (acute) exacerbation: Secondary | ICD-10-CM | POA: Diagnosis not present

## 2018-04-26 DIAGNOSIS — J039 Acute tonsillitis, unspecified: Secondary | ICD-10-CM

## 2018-04-26 DIAGNOSIS — J029 Acute pharyngitis, unspecified: Secondary | ICD-10-CM | POA: Insufficient documentation

## 2018-04-26 DIAGNOSIS — J309 Allergic rhinitis, unspecified: Secondary | ICD-10-CM | POA: Diagnosis not present

## 2018-04-26 DIAGNOSIS — K219 Gastro-esophageal reflux disease without esophagitis: Secondary | ICD-10-CM | POA: Insufficient documentation

## 2018-04-26 DIAGNOSIS — Z91018 Allergy to other foods: Secondary | ICD-10-CM | POA: Insufficient documentation

## 2018-04-26 LAB — CULTURE, GROUP A STREP

## 2018-04-26 LAB — RAPID STREP SCREEN (MED CTR MEBANE ONLY): STREP GP A AG, IA W/REFLEX: NEGATIVE

## 2018-04-26 NOTE — Progress Notes (Addendum)
[address] [place] [address] Dept: [phone]  FOLLOW UP NOTE  Patient ID: Joseph DingwallGabriel [name], male    DOB: 11/29/1980  Age: 37 y.o. MRN: [id number] Date of Office Visit: 04/26/2018  Assessment  Chief Complaint: Cough  HPI [name] is a 37 year old male who presents to the clinic for a sick visit. He was last seen in this clinic on 12/08/2017 by Dr. [name] for evaluation of asthma, allergic rhinitis, reflux, ETD, and shellfish allergy. At that time, he continued on allergen immunotherapy once a week.  At today's visit, he reports that he began coughing and wheezing on Saturday (3 days ago). He reports some phlegm with the cough that began on Saturday. He denies fever, sweats, or chills and reports that his mother has recently had a cough and he has been in close contact with his nieces.   In addition, he reports a sore throat, thick post nasal drainage, and voice change which sounds gravely. He is having intermittent headache that he thinks is not related to the nasal drainage and denies sinus pressure. He does report pressure in both ears. He denies ear pain or drainage. He continues on allergen immunotherapy and reports a significant improvement in his symptoms overall.   Reflux is reported as not well controlled with daily heartburn. He had been taking Tums to control his reflux, however, he has recently found some ranitidine that he had at his house and started taking this. He is unsure of the dose and is taking this once a day with moderate relief of heartburn.  His current medications are listed in the chart.   Drug Allergies:  Allergies  Allergen Reactions  . Flonase [Fluticasone] Shortness Of Breath  . Sulfites Itching and Swelling  . Molds & Smuts   . Tetracyclines & Related Other (See Comments)    increased intracranial pressure    Physical Exam: BP 128/76   Pulse 90   Temp 97.8 F (36.6 C)   Resp 19   SpO2 97%    Physical Exam    Constitutional: He is oriented to person, place, and time. He appears well-developed and well-nourished.  HENT:  Head: Normocephalic.  Right Ear: External ear normal.  Left Ear: External ear normal.  Bilateral nares erythematous and edematous with clear nasal drainage noted. Tonsillar exudate noted. Ears normal. Eyes normal.   Eyes: Conjunctivae are normal.  Neck: Normal range of motion. Neck supple.  Cardiovascular: Normal rate, regular rhythm and normal heart sounds.  No murmur noted  Pulmonary/Chest: Effort normal and breath sounds normal.  Lungs clear to auscultation  Musculoskeletal: Normal range of motion.  Neurological: He is alert and oriented to person, place, and time.  Skin: Skin is warm.  Psychiatric: He has a normal mood and affect. His behavior is normal. Judgment and thought content normal.    Diagnostics: FVC 1.78, FEV1 1.75. Predicted FVC 5.34, predicted FEV1 4.33. Post bronchodilator therapy FVC 2.16, FEV1 1.98. Post bronchodilator therapy reveals severs restriction with significant improvement in FEV1 (14%)  Assessment and Plan: 1. Moderate persistent asthma with acute exacerbation   2. Tonsillitis   3. Allergic rhinitis, unspecified seasonality, unspecified trigger   4. Gastroesophageal reflux disease, esophagitis presence not specified      Patient Instructions   1. Continue to perform Allergen avoidance measures as best as possible  2. Continue immunotherapy  3. Continue to Treat and prevent inflammation:   A. Arnuity 200 - one inhalation one time per day.  B. montelukast 10 mg tablet once a day  C. Budesonide 0.5 mg in 100 ml nasal saline few times per week  D. Prednisone 10 mg tablets. Take 1 tablet once a day for the next 5 days, then stop. Monitor your blood sugars before you eat breakfast daily for the next week.  4. Treat reflux:   A. Ranitidine 150 mg twice a day. Limit caffeine. Sources include tea, coffee, chocolate, peppermint  5. If  needed:   A. nasal saline / wash  B. OTC antihistamine  C. Auvi-Q 0.3  D. Proair HFA 2 inhalations every 4-6 hours  E. ibuprofen  6. We are sending out a test to determine if you have an infection in your throat. I will call you as soon as we get the result.  7. Return to clinic in 4 weeks or earlier if problem      Return in about 1 month (around 05/24/2018), or if symptoms worsen or fail to improve.    Thank you for the opportunity to care for this patient.  Please do not hesitate to contact me with questions.  Thermon Leyland, FNP [facility]

## 2018-04-26 NOTE — Patient Instructions (Addendum)
  1. Continue to perform Allergen avoidance measures as best as possible  2. Continue immunotherapy  3. Continue to Treat and prevent inflammation:   A. Arnuity 200 - one inhalation one time per day.   B. montelukast 10 mg tablet once a day  C. Budesonide 0.5 mg in 100 ml nasal saline few times per week  D. Prednisone 10 mg tablets. Take 1 tablet once a day for the next 5 days, then stop. Monitor your blood sugars before you eat breakfast daily for the next week.  4. Treat reflux:   A. Ranitidine 150 mg twice a day. Limit caffeine. Sources include tea, coffee, chocolate, peppermint  5. If needed:   A. nasal saline / wash  B. OTC antihistamine  C. Auvi-Q 0.3  D. Proair HFA 2 inhalations every 4-6 hours  E. ibuprofen  6. We are sending out a test to determine if you have an infection in your throat. I will call you as soon as we get the result.  7. Return to clinic in 4 weeks or earlier if problem

## 2018-04-27 ENCOUNTER — Telehealth: Payer: Self-pay | Admitting: Family Medicine

## 2018-04-27 NOTE — Telephone Encounter (Signed)
Patient reports he has taken 1 dose of prednisone and is beginning to breathe better. He continues to have a sore throat, however, less painful than yesterday. He agrees to increase Arnuity 200 to 1 puff twice a day for the next few days and then decrease back to 1 puff once a day. I will call him when his rapid strep and throat culture results are available. Patient verbalizes agreement with this treatment plan and understanding. He will call with fever, worsening of symptoms or any questions.

## 2018-04-27 NOTE — Telephone Encounter (Signed)
Attempted to call Dr Alveda ReasonsFuller's office. Their office is closed this wk. Will have to contact next wk.

## 2018-04-28 ENCOUNTER — Encounter: Payer: Self-pay | Admitting: Family Medicine

## 2018-04-28 ENCOUNTER — Ambulatory Visit: Payer: 59 | Admitting: Family Medicine

## 2018-04-28 VITALS — BP 118/74 | HR 92 | Temp 98.1°F | Resp 20 | Ht 70.0 in

## 2018-04-28 DIAGNOSIS — Z91018 Allergy to other foods: Secondary | ICD-10-CM

## 2018-04-28 DIAGNOSIS — J4541 Moderate persistent asthma with (acute) exacerbation: Secondary | ICD-10-CM

## 2018-04-28 DIAGNOSIS — J309 Allergic rhinitis, unspecified: Secondary | ICD-10-CM

## 2018-04-28 MED ORDER — AMOXICILLIN-POT CLAVULANATE 875-125 MG PO TABS: 1.0000 | ORAL_TABLET | Freq: Two times a day (BID) | ORAL | 0 refills | Status: DC

## 2018-04-28 NOTE — Progress Notes (Addendum)
[address] [place] [address] Dept: [phone]  FOLLOW UP NOTE  Patient ID: [name], male    DOB: [birth year]  Age: 37 y.o. MRN: [id number] Date of Office Visit: 04/28/2018  Assessment  Chief Complaint: Shortness of Breath  HPI [name] is a 37 year old male who presents to the clinic for a sick visit. He was last seen in this clinic on 04/26/2018 by Thermon Leyland, NP for evaluation of asthma with acute exacerbation, allergic rhinitis, and reflux. At that time, he began using budesonide nasal rinses, ranitidine 150 mg twice a day, and prednisone 10 mg once a day for 5 days.   He reported, via phone, as feeling slightly better yesterday with a decrease in throat pain, however, he reported remaining short of breath. He increased his Arnuity 200 to 2 puffs once a day at that time.   At today's visit, he reports continued shortness of breath, wheezing, and dry cough. He reports a decrease in the amount of heartburn he had been experiencing earlier in the week. He reports nasal congestion and thick post nasal drip, however, he reports minimal clear nasal drainage. He denies fever, sweats, and chills over the last few days. He has increased his Arnuity to twice a day and has taken prednisone 10 mg once a day for the last 3 days. He continues to monitor his blood sugar and adjust his insulin via pump accordingly.   His current medications are listed in the chart.   Drug Allergies:  Allergies  Allergen Reactions  . Flonase [Fluticasone] Shortness Of Breath  . Sulfites Itching and Swelling  . Molds & Smuts   . Tetracyclines & Related Other (See Comments)    increased intracranial pressure    Physical Exam: BP 118/74   Pulse 92   Temp 98.1 F (36.7 C) (Oral)   Resp 20   Ht 5\' 10"  (1.778 m)   SpO2 97%   BMI 36.73 kg/m    Physical Exam  Constitutional: He is oriented to person, place, and time. He appears well-developed and well-nourished.  HENT:    Head: Normocephalic.  Right Ear: External ear normal.  Left Ear: External ear normal.  Mouth/Throat: Oropharynx is clear and moist.  Bilateral nares slightly erythematous and edematous with no nasal drainage noted. Ears normal. Eyes normal. Pharynx remains erythematous with no exudate at this time.   Eyes: Conjunctivae are normal.  Neck: Normal range of motion. Neck supple.  Cardiovascular: Normal rate, regular rhythm and normal heart sounds.  No murmur noted  Pulmonary/Chest:  Lungs with slight bilateral expiratory wheezing which diminished slightly post bronchodilator therapy  Musculoskeletal: Normal range of motion.  Neurological: He is alert and oriented to person, place, and time.  Skin: Skin is warm and dry.  Psychiatric: He has a normal mood and affect. His behavior is normal. Judgment and thought content normal.    Diagnostics: The first spirometry was performed post bronchodilator with Atrovent and Xopenex as he was not able to stop coughing long enough for a pre bronchodilator spirometry. FVC 1.60, FEV1 1.56. Predicted FVC 5.39, predicted FEV1 4.33. Post second bronchodilator spirometry FVC 2..10, FEV1 2.04. Spirometry indicates severe restriction with moderate improvement in FEV1.  Assessment and Plan: 1. Moderate persistent asthma with acute exacerbation   2. Food allergy   3. Allergic rhinitis, unspecified seasonality, unspecified trigger     Meds ordered this encounter  Medications  . amoxicillin-clavulanate (AUGMENTIN) 875-125 MG tablet    Sig: Take  1 tablet by mouth 2 (two) times daily.    Dispense:  20 tablet    Refill:  0    1. Continue to perform Allergen avoidance measures as best as possible  2. Continue immunotherapy in 1 week if feeling better  3. Continue to Treat and prevent inflammation:   A. Arnuity 200 - one inhalation 2 times per day. Add Asmanex 200 -3 puffs 3 times a day with a spacer during flare up  B. montelukast 10 mg tablet once a  day  C. Budesonide 0.5 mg in 100 ml nasal saline once a day. Continue with saline rinses once a day  D. Continue Prednisone 10 mg tablets that were prescribed on Monday. Take 1 tablet once a day. There should be 2 dayof this medication left from your last visit . Continue to monitor your blood sugars and adjust insulin accordingly  4. Treat reflux:   A. Ranitidine 150 mg twice a day. Limit caffeine. Sources include tea, coffee, chocolate, peppermint  5. If needed:   A. nasal saline / wash  B. OTC antihistamine  C. Auvi-Q 0.3  D. Proair HFA 2 inhalations every 4-6 hours  E. Ibuprofen  6. Begin Augmentin 875 twice a day for 10 days for infection  7. Return to clinic in 2 weeks or earlier if problem. Call me if this treatment plan is not working well for you  It appears that [name] has a upper respiratory tract infection and hopefully with the therapy noted above he will resolve this issue over the next week or so. He will keep in contact with [place] noting his response to this treatment.  Thank you for the opportunity to care for this patient.  Please do not hesitate to contact me with questions.  Thermon Leyland, FNP [facility]  I have provided oversight concerning Thermon Leyland' evaluation and treatment of this patient's health issues addressed during today's encounter. I agree with the assessment and therapeutic plan as outlined in the note.   Signed,   Jessica Priest, MD,  Allergy and Immunology,  [facility].

## 2018-04-28 NOTE — Patient Instructions (Addendum)
  1. Continue to perform Allergen avoidance measures as best as possible  2. Continue immunotherapy in 1 week if feeling better  3. Continue to Treat and prevent inflammation:   A. Arnuity 200 - one inhalation 2 times per day. Add Asmanex 200 -3 puffs 3 times a day with a spacer during flare up  B. montelukast 10 mg tablet once a day  C. Budesonide 0.5 mg in 100 ml nasal saline once a day. Continue with saline rinses once a day  D. Continue Prednisone 10 mg tablets that were prescribed on Monday. Take 1 tablet once a day. There should be 2 dayof this medication left from your last visit . Continue to monitor your blood sugars and adjust insulin accordingly  4. Treat reflux:   A. Ranitidine 150 mg twice a day. Limit caffeine. Sources include tea, coffee, chocolate, peppermint  5. If needed:   A. nasal saline / wash  B. OTC antihistamine  C. Auvi-Q 0.3  D. Proair HFA 2 inhalations every 4-6 hours  E. Ibuprofen  6. Begin Augmentin 875 twice a day for 10 days for infection  7. Return to clinic in 2 weeks or earlier if problem. Call me if this treatment plan is not working well for you

## 2018-04-29 LAB — CULTURE, GROUP A STREP

## 2018-04-30 ENCOUNTER — Other Ambulatory Visit (INDEPENDENT_AMBULATORY_CARE_PROVIDER_SITE_OTHER): Payer: Self-pay | Admitting: "Endocrinology

## 2018-04-30 DIAGNOSIS — E1065 Type 1 diabetes mellitus with hyperglycemia: Principal | ICD-10-CM

## 2018-04-30 DIAGNOSIS — IMO0001 Reserved for inherently not codable concepts without codable children: Secondary | ICD-10-CM

## 2018-05-03 ENCOUNTER — Other Ambulatory Visit: Payer: Self-pay

## 2018-05-03 DIAGNOSIS — Z91018 Allergy to other foods: Secondary | ICD-10-CM

## 2018-05-03 DIAGNOSIS — J3089 Other allergic rhinitis: Secondary | ICD-10-CM

## 2018-05-03 DIAGNOSIS — H6983 Other specified disorders of Eustachian tube, bilateral: Secondary | ICD-10-CM

## 2018-05-03 MED ORDER — MONTELUKAST SODIUM 10 MG PO TABS: ORAL_TABLET | ORAL | 4 refills | Status: DC

## 2018-05-03 NOTE — Progress Notes (Signed)
Can you please call this patient and let him know his throat culture was positive for beta-hemolytic colonies, not group A Strep (this is the typical strep throata0. He is currently taking Augmentin which will be effective. Thank you. Can you please ask him about his symptoms to be sure his is improving??

## 2018-05-03 NOTE — Telephone Encounter (Signed)
Patient needs to keep appt for further refills  

## 2018-05-04 LAB — RAPID STREP SCREEN (MED CTR MEBANE ONLY): Strep Gp A Ag, IA W/Reflex: NEGATIVE

## 2018-05-04 LAB — CULTURE, GROUP A STREP

## 2018-05-04 NOTE — Telephone Encounter (Signed)
[name] with Dr. Alveda ReasonsFuller's office returning RNs call, requesting a call back at [phone]

## 2018-05-04 NOTE — Telephone Encounter (Signed)
Called Dr Lonia ChimeraFullers office, [facility] was unavailable to come to phone. Left message for her to call again.   If unable to talk with her when she calls my main question is when the patient is referred and if we have done a HST here which shows mild apnea and Dr Vickey Hugerohmeier wants dental device do they repeat HST there as well. IF so then I would say go ahead and have this patient complete his sleep testing through them If they do accept our test and set up based off of our test then we can complete HST here.   I hope I answer what her concern was.

## 2018-05-05 ENCOUNTER — Other Ambulatory Visit: Payer: Self-pay | Admitting: Allergy and Immunology

## 2018-05-07 ENCOUNTER — Telehealth: Payer: Self-pay | Admitting: *Deleted

## 2018-05-07 NOTE — Telephone Encounter (Signed)
You are most welcome! 

## 2018-05-07 NOTE — Telephone Encounter (Signed)
I called and spoke with [name] he stated that he does still have a sore throat, drainage, and cough. He said that he feels like his breathing has improved and only has occasional wheezing. He said he feels like he can get around a lot better and does not become fatigued as quickly.

## 2018-05-07 NOTE — Telephone Encounter (Signed)
-----   Message from Hetty BlendAnne M Ambs, FNP sent at 05/07/2018  7:13 AM EDT ----- Can you please call this patient and find out if he is feeling better? Please ask about breathing, throat and fever. Thank you. I appreciate your help

## 2018-05-11 ENCOUNTER — Encounter: Payer: Self-pay | Admitting: Neurology

## 2018-05-11 ENCOUNTER — Encounter: Payer: Self-pay | Admitting: Allergy and Immunology

## 2018-05-11 ENCOUNTER — Ambulatory Visit (INDEPENDENT_AMBULATORY_CARE_PROVIDER_SITE_OTHER): Payer: 59 | Admitting: Allergy and Immunology

## 2018-05-11 VITALS — BP 122/82 | HR 92 | Temp 97.7°F | Resp 20

## 2018-05-11 DIAGNOSIS — K219 Gastro-esophageal reflux disease without esophagitis: Secondary | ICD-10-CM | POA: Diagnosis not present

## 2018-05-11 DIAGNOSIS — J3089 Other allergic rhinitis: Secondary | ICD-10-CM | POA: Diagnosis not present

## 2018-05-11 DIAGNOSIS — J4541 Moderate persistent asthma with (acute) exacerbation: Secondary | ICD-10-CM

## 2018-05-11 DIAGNOSIS — B370 Candidal stomatitis: Secondary | ICD-10-CM | POA: Diagnosis not present

## 2018-05-11 MED ORDER — MOMETASONE FUROATE 200 MCG/ACT IN AERO: 2.0000 | INHALATION_SPRAY | Freq: Two times a day (BID) | RESPIRATORY_TRACT | 5 refills | Status: DC

## 2018-05-11 MED ORDER — NYSTATIN 100000 UNIT/ML MT SUSP: OROMUCOSAL | 0 refills | Status: DC

## 2018-05-11 NOTE — Patient Instructions (Signed)
  1. Continue to perform Allergen avoidance measures as best as possible  2. Continue immunotherapy and Auvi-Q  3. Continue to Treat and prevent inflammation:   A. Asmanex 200 -2 puffs 2 times a day with a spacer   B. montelukast 10 mg tablet once a day  C. Budesonide 0.5 mg in 100 ml nasal saline once a day.      4. Treat reflux:   A. Ranitidine 150 mg twice a day. Limit caffeine.    5. Treat thrush:   A. Nystatin oral solution: swish + swallow 3 times per day x 10 days  6. If needed:   A. nasal saline / wash  B. OTC antihistamine  C. Auvi-Q 0.3  D. Proair HFA 2 inhalations every 4-6 hours  E. Ibuprofen  7. Return to clinic in 4 weeks or earlier if problem.

## 2018-05-11 NOTE — Progress Notes (Signed)
Follow-up Note  Referring Provider: Gaspar Garbeisovec, Richard W, MD Primary Provider: Gaspar Garbeisovec, Richard W, MD Date of Office Visit: 05/11/2018  Subjective:   [name] (DOB: 09/20/1981) is a 37 y.o. male who [facility] in re-evaluation of the following:  HPI: Joseph BeachGabe returns to this clinic in reevaluation of his asthma and allergic rhinitis and reflux and a recent episode of respiratory tract infection.  His last visit to this clinic was 28 April 2018.  He has finished his Augmentin and has finished his low-dose systemic steroid and he feels better.  He has much less cough although he still thinks his chest might be somewhat congested.  He has developed a significant sore throat recently.  His head has been doing relatively well.  Reflux has not been an issue at this point.  Allergies as of 05/11/2018      Reactions   Flonase [fluticasone] Shortness Of Breath   Sulfites Itching, Swelling   Molds & Smuts    Tetracyclines & Related Other (See Comments)   increased intracranial pressure      Medication List      acetaZOLAMIDE 500 MG capsule Commonly known as:  DIAMOX SEQUELS Take 1 capsule (500 mg total) by mouth 2 (two) times daily.   albuterol 108 (90 Base) MCG/ACT inhaler Commonly known as:  PROAIR HFA Inhale 2 puffs into the lungs every 6 (six) hours as needed for wheezing or shortness of breath.   ARNUITY ELLIPTA 200 MCG/ACT Aepb Generic drug:  Fluticasone Furoate INHALE 1 PUFF INTO THE LUNGS DAILY   AUVI-Q 0.3 mg/0.3 mL Soaj injection Generic drug:  EPINEPHrine Use as directed for life-threatening allergic reaction.   clonazePAM 0.5 MG tablet Commonly known as:  KLONOPIN Take 0.5 mg by mouth 2 (two) times daily as needed.   HUMALOG 100 UNIT/ML injection Generic drug:  insulin lispro INJECT 300 UNITS UNDER THE SKIN VIA INSULIN PUMP EVERY 48-72 HOURS PER HYPERGLYCEMIA AND DKA PROTOCOLS   ibuprofen 600 MG tablet Commonly  known as:  ADVIL,MOTRIN Take 600 mg by mouth 2 (two) times daily as needed.   losartan 50 MG tablet Commonly known as:  COZAAR TAKE 1 TABLET(50 MG) BY MOUTH DAILY   montelukast 10 MG tablet Commonly known as:  SINGULAIR TAKE 1 TABLET(10 MG) BY MOUTH AT BEDTIME   ONE TOUCH ULTRA TEST test strip Generic drug:  glucose blood Check sugar 15 x daily   ONE TOUCH ULTRA TEST test strip Generic drug:  glucose blood USE AS DIRECTED SIX TIMES DAILY   TYLENOL 500 MG tablet Generic drug:  acetaminophen Take 1,000 mg by mouth 2 (two) times daily as needed.       Past Medical History:  Diagnosis Date  . ADHD (attention deficit hyperactivity disorder)   . Autonomic neuropathy due to diabetes (HCC)   . Diabetes (HCC)   . Fatigue   . Goiter   . High blood pressure   . High cholesterol   . Hypercholesterolemia   . Hypertension   . Hypoglycemia associated with diabetes (HCC)   . Obesity   . Tachycardia   . Type 1 diabetes mellitus not at goal [facility](HCC)   . Uncontrolled DM with microalbuminuria or microproteinuria     Past Surgical History:  Procedure Laterality Date  . none    . WISDOM TOOTH EXTRACTION      Review of systems negative except as noted in HPI / PMHx or noted below:  Review  of Systems  Constitutional: Negative.   HENT: Negative.   Eyes: Negative.   Respiratory: Negative.   Cardiovascular: Negative.   Gastrointestinal: Negative.   Genitourinary: Negative.   Musculoskeletal: Negative.   Skin: Negative.   Neurological: Negative.   Endo/Heme/Allergies: Negative.   Psychiatric/Behavioral: Negative.      Objective:   Vitals:   05/11/18 0938  BP: 122/82  Pulse: 92  Resp: 20  Temp: 97.7 F (36.5 C)  SpO2: 98%          Physical Exam  HENT:  Head: Normocephalic.  Right Ear: Tympanic membrane, external ear and ear canal normal.  Left Ear: Tympanic membrane, external ear and ear canal normal.  Nose: Nose normal. No mucosal edema or rhinorrhea.    Mouth/Throat: Uvula is midline and mucous membranes are normal. Oropharyngeal exudate (Thrush) present.  Eyes: Conjunctivae are normal.  Neck: Trachea normal. No tracheal tenderness present. No tracheal deviation present. No thyromegaly present.  Cardiovascular: Normal rate, regular rhythm, S1 normal, S2 normal and normal heart sounds.  No murmur heard. Pulmonary/Chest: Breath sounds normal. No stridor. No respiratory distress. He has no wheezes. He has no rales.  Musculoskeletal: He exhibits no edema.  Lymphadenopathy:       Head (right side): No tonsillar adenopathy present.       Head (left side): No tonsillar adenopathy present.    He has no cervical adenopathy.  Neurological: He is alert.  Skin: No rash noted. He is not diaphoretic. No erythema. Nails show no clubbing.    Diagnostics:    Spirometry was performed and demonstrated an FEV1 of 2.39 at 55 % of predicted.  The patient had an Asthma Control Test with the following results: ACT Total Score: 9.    Assessment and Plan:   1. Moderate persistent asthma with acute exacerbation   2. Other allergic rhinitis   3. Thrush   4. Gastroesophageal reflux disease, esophagitis presence not specified     1. Continue to perform Allergen avoidance measures as best as possible  2. Continue immunotherapy and Auvi-Q  3. Continue to Treat and prevent inflammation:   A. Asmanex 200 -2 puffs 2 times a day with a spacer   B. montelukast 10 mg tablet once a day  C. Budesonide 0.5 mg in 100 ml nasal saline once a day.      4. Treat reflux:   A. Ranitidine 150 mg twice a day. Limit caffeine.    5. Treat thrush:   A. Nystatin oral solution: swish + swallow 3 times per day x 10 days  6. If needed:   A. nasal saline / wash  B. OTC antihistamine  C. Auvi-Q 0.3  D. Proair HFA 2 inhalations every 4-6 hours  E. Ibuprofen  7. Return to clinic in 4 weeks or earlier if problem.     [name] has rather significant thrush which is  not surprising given the fact that he has diabetes, used low-dose systemic steroid, and used a broad-spectrum antibiotics to treat his recent respiratory tract infection.  We will treat him with nystatin at this point in time.  He does appear to have done relatively well with therapy directed against his infection and now we will continue to have him use anti-inflammatory agents for both his upper and lower airway as noted above as well as treating his reflux with a H2 receptor blocker.  I will see him back in this clinic in 4 weeks or earlier if there is a problem.  Minerva Areola  Neldon Mc, MD Allergy / Immunology [place]

## 2018-05-12 ENCOUNTER — Encounter: Payer: Self-pay | Admitting: Allergy and Immunology

## 2018-05-13 ENCOUNTER — Other Ambulatory Visit: Payer: Self-pay | Admitting: Neurology

## 2018-05-13 ENCOUNTER — Encounter: Payer: Self-pay | Admitting: Neurology

## 2018-05-13 MED ORDER — ACETAZOLAMIDE ER 500 MG PO CP12: ORAL_CAPSULE | ORAL | 5 refills | Status: DC

## 2018-05-17 ENCOUNTER — Telehealth: Payer: Self-pay | Admitting: Neurology

## 2018-05-17 NOTE — Telephone Encounter (Signed)
[facility] Dr. Alveda ReasonsFuller's office.

## 2018-05-17 NOTE — Telephone Encounter (Signed)
[name] is calling back and verified Dr Vickey Hugerohmeier is ok with them setting the pt up to get him established with sleep study for a dental device.

## 2018-05-20 ENCOUNTER — Telehealth: Payer: Self-pay | Admitting: *Deleted

## 2018-05-20 ENCOUNTER — Other Ambulatory Visit: Payer: Self-pay | Admitting: *Deleted

## 2018-05-20 ENCOUNTER — Ambulatory Visit (INDEPENDENT_AMBULATORY_CARE_PROVIDER_SITE_OTHER): Payer: 59 | Admitting: *Deleted

## 2018-05-20 ENCOUNTER — Encounter: Payer: Self-pay | Admitting: Allergy and Immunology

## 2018-05-20 ENCOUNTER — Other Ambulatory Visit: Payer: Self-pay

## 2018-05-20 DIAGNOSIS — J309 Allergic rhinitis, unspecified: Secondary | ICD-10-CM

## 2018-05-20 MED ORDER — NYSTATIN 100000 UNIT/ML MT SUSP: OROMUCOSAL | 0 refills | Status: DC

## 2018-05-20 NOTE — Telephone Encounter (Signed)
Medication has been sent in. I called and spoke with the patient and informed them, I also let them know that if there was not a significant change to please let us know.

## 2018-05-20 NOTE — Telephone Encounter (Signed)
-----   Message from Jessica PriestEric J Kozlow, MD sent at 05/20/2018  1:39 PM EDT ----- Please refill nystatin prescription and inform patient.

## 2018-05-27 ENCOUNTER — Ambulatory Visit (INDEPENDENT_AMBULATORY_CARE_PROVIDER_SITE_OTHER): Payer: 59 | Admitting: *Deleted

## 2018-05-27 DIAGNOSIS — J309 Allergic rhinitis, unspecified: Secondary | ICD-10-CM

## 2018-05-30 ENCOUNTER — Other Ambulatory Visit (INDEPENDENT_AMBULATORY_CARE_PROVIDER_SITE_OTHER): Payer: Self-pay | Admitting: "Endocrinology

## 2018-05-30 ENCOUNTER — Other Ambulatory Visit: Payer: Self-pay | Admitting: Allergy and Immunology

## 2018-05-30 DIAGNOSIS — IMO0001 Reserved for inherently not codable concepts without codable children: Secondary | ICD-10-CM

## 2018-05-30 DIAGNOSIS — E1065 Type 1 diabetes mellitus with hyperglycemia: Principal | ICD-10-CM

## 2018-05-31 ENCOUNTER — Ambulatory Visit (INDEPENDENT_AMBULATORY_CARE_PROVIDER_SITE_OTHER): Payer: 59 | Admitting: "Endocrinology

## 2018-05-31 ENCOUNTER — Encounter (INDEPENDENT_AMBULATORY_CARE_PROVIDER_SITE_OTHER): Payer: Self-pay | Admitting: "Endocrinology

## 2018-05-31 VITALS — BP 108/68 | HR 112 | Ht 71.54 in | Wt 258.8 lb

## 2018-05-31 DIAGNOSIS — R5383 Other fatigue: Secondary | ICD-10-CM

## 2018-05-31 DIAGNOSIS — E1043 Type 1 diabetes mellitus with diabetic autonomic (poly)neuropathy: Secondary | ICD-10-CM | POA: Diagnosis not present

## 2018-05-31 DIAGNOSIS — R000 Tachycardia, unspecified: Secondary | ICD-10-CM | POA: Diagnosis not present

## 2018-05-31 DIAGNOSIS — E1065 Type 1 diabetes mellitus with hyperglycemia: Secondary | ICD-10-CM

## 2018-05-31 DIAGNOSIS — E063 Autoimmune thyroiditis: Secondary | ICD-10-CM

## 2018-05-31 DIAGNOSIS — E782 Mixed hyperlipidemia: Secondary | ICD-10-CM

## 2018-05-31 DIAGNOSIS — E10649 Type 1 diabetes mellitus with hypoglycemia without coma: Secondary | ICD-10-CM | POA: Diagnosis not present

## 2018-05-31 DIAGNOSIS — I10 Essential (primary) hypertension: Secondary | ICD-10-CM

## 2018-05-31 DIAGNOSIS — E049 Nontoxic goiter, unspecified: Secondary | ICD-10-CM

## 2018-05-31 DIAGNOSIS — IMO0001 Reserved for inherently not codable concepts without codable children: Secondary | ICD-10-CM

## 2018-05-31 LAB — POCT GLYCOSYLATED HEMOGLOBIN (HGB A1C): Hemoglobin A1C: 6.6 % — AB (ref 4.0–5.6)

## 2018-05-31 LAB — POCT GLUCOSE (DEVICE FOR HOME USE): GLUCOSE FASTING, POC: 202 mg/dL — AB (ref 70–99)

## 2018-05-31 NOTE — Progress Notes (Signed)
Subjective:  Patient Name: [name] Date of Birth: 1981/08/16  MRN: [id number]  [name]  presents to the office today for follow-up of his type 1 diabetes mellitus, goiter, obesity, combined hyperlipidemia, adult ADHD, fatigue, autonomic neuropathy, tachycardia, hypertension, microalbuminuria, hypoglycemia, sinusitis, bronchitis, allergies, proliferative retinopathy, and recent increased intracranial hypertension.  HISTORY OF PRESENT ILLNESS:   [name] is a 36 y.o. Caucasian gentleman. [name] was unaccompanied.  1. The patient was first referred to me on 01/08/2006 by his family physician, Dr. Roanna Epley, for evaluation and management of type 1 diabetes mellitus and related problems. The patient was 37 years old.  A. [name] had been diagnosed with type 1 diabetes somewhere in 2001-2002, at the age of 54-19. He was initially diagnosed with type 2 diabetes mellitus, but was later re-classified as type 1 diabetes mellitus. He had been followed by Dr. Arther Dames, staff endocrinologist at [facility]. [name] was started on an insulin pump approximately 18 months prior to first seeing me. His pump was a Medtronic Paradigm 712. His blood glucose control was fair-to-poor. He was frequently not checking blood sugars or taking insulin boluses as often as he needed to. He frequently noted fast heart rate. The patient's past medical history was positive for hypertension, for which he was taking lisinopril, and for what his mother called "extreme ADHD". He had previously been taking ADHD medicines but had discontinued them due to adverse effects. He had prior ankle injuries and knee injuries, to include tears of the lateral menisci bilaterally. He had not had any surgeries. He was working for a [employer] that had many different job sites in different parts of the [place] and in other countries as well. 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 He did not  use tobacco or drugs, but did drink alcohol occasionally.  B. On physical examination, his weight was 234 pounds, his height was 70 inches, his BMI was 33.2. Blood pressure was 128/70. Hemoglobin A1c was 9.5%. Heart rate was 96. He had normal affect and fair insight. He had a 25-30 gram goiter. He had normal 1+ DP pulses in his feet. He had normal sensation in his feet to touch, vibration, and monofilament. His CMP was normal except for glucose of 251. His cholesterol was 257, triglycerides 143, HDL 49, and LDL 179. TSH was 1.017, free T4 was 1.06, and free T3 was 3.3. His urinary microalbumin: creatinine ratio was 20.1 (normal less than 30).   C. The patient clearly needed better blood glucose control, which would only occur if he had better adherence to his plan. He appeared to have autonomic neuropathy, manifested by inappropriate sinus tachycardia.  His thyroid goiter suggested that he might have evolving Hashimoto's disease. He stated that he had lost some weight recently. I encouraged him to check his blood sugars more frequently and to take both correction boluses and food boluses.  2. 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 His hemoglobin A1c values have varied from 8.3-11.2%. Throughout the 12 year period, the patient's autonomic neuropathy and tachycardia have remained essentially the same. On 12/03/10 his total cholesterol was 270, triglycerides 94, HDL 50, and LDL 201. I started him on Crestor, 10 mg per day. He has since begun treatment with atorvastatin and his cholesterol  values had improved. In March 2018 he was converted to a Medtronic 670G pump and Guardian 3 CGM sensor. Since then his BGs have been much better. Unfortunately, beginning in about June 2018 he has also had a series of episodes of  bronchitis and sinusitis, muscle pains, and lethargy that presumably were due in large part to severe allergies. He has been very sick for much of the past year.  His atorvastatin and several other medications were stopped at that time. He has also developed increased intracranial pressure, strep throat, and thrush.   3. The patient's last [facility] visit was on 03/18/18. At that visit we continued his current pump settings.   A. He got sick again with a strep throat and difficulty breathing about 4 weeks ago. He was treated with antibiotics, prednisone, and had several nebulizer treatments. He then developed thrush and was treated for that. He is still tired and gets short of breath with exertion.   B. He saw Dr. Luciana Axe again a few weeks ago. He did not see any changes associated with Diamox. Dr. Luciana Axe was pleased with the results of laser therapy for his neovascularization and possible macular edema.   C. He saw Dr. Porfirio Mylar Dohmeier in neurology again on 03/25/18. She increased his Diamox dose to 1000 mg in the morning and 500 mg at night. His HAs now only occur occasionally.    D. He continues to have problems with lactose intolerance. When he avoids lactose, however, he is asymptomatic. Some foods such as shellfish cause his throat to swell up.   E. He is using Humalog aspart insulin in his insulin pump. He takes losartan, 50 mg/day. He rarely has any coughing unless his allergies act up. .    F. His allergy shots are going well. His allergic sinusitis has been quiescent. He is still working from home.   G. He had another sleep study last week. He is awaiting the results.   H. His BGs have varied with his illnesses and with the amount of postprandial bloating and constipation that he has. He sometimes has to delay his boluses if he is worried that his gastric emptying will be too delayed and cause hypoglycemia.     4. Pertinent Review of Systems: Constitutional: [name] feels "exhausted". "I'm still having a  ton of digestive problems." He is still somewhat sore and achy, but not as bad.  Eyes: Last exam was on 05/17/18 as noted above.  Neck: He has not had had any further anterior neck swelling and tenderness.  Heart: His heart rate has been elevated at times, especially when he was having more health issues last week.  The patient has no complaints of palpitations, irregular heat beats, chest pain, or chest pressure.  Gastrointestinal: As above. He has some nausea as noted above. The patient has no other complaints of excessive hunger, heartburn, acid reflux, upset stomach, stomach aches or pains, or diarrhea. Legs: He still occasionally has the sensation of vibration in his legs. Muscle mass and strength seem normal. There are no other complaints of numbness, tingling, burning, or pain. No edema is noted. Feet: There are no obvious foot problems now. There are no complaints of numbness, burning, or pain.  No edema is noted. Hypoglycemia: He has not been having many low BGs.  Emotional: His emotional state varies with his illnesses. He had his follow up appointment with Dr. Piedad Climes in [place] in November 2018. Dr. Shane Crutch will wait until [name]'s overall health situation stabilizes  before trying any new psych medications.     5. BG printout: We have data from the past 4 weeks. He changes sites every 4 days. He checks BGs 3-6 times daily, usually 3 times. Average BG is 166, compared with 157 at his last visit and with 152 at his prior visit. BGs varied from 96-270, compared with 71-289 at his last visit and with 84-221 at his prior visit. His higher BGs occurred after lunch. His BGs tend to be lower at 6 AM. Prednisone and illness cause higher BGs.   6. CGM printout: Skin glucose values and BG values correlate very well. Average SG was 151, compared with 144 at his last visit and with 143 at his prior visit  He was in auto mode 99% of the time. He was in zone 81% of the time. His highest SGs  occurred around  12-3 PM. His lowest SGs occurred from 10-11 PM. He sometimes consumes more carbs than he realizes.     PAST MEDICAL, FAMILY, AND SOCIAL HISTORY:  Past Medical History:  Diagnosis Date  . ADHD (attention deficit hyperactivity disorder)   . Autonomic neuropathy due to diabetes (HCC)   . Diabetes (HCC)   . Fatigue   . Goiter   . High blood pressure   . High cholesterol   . Hypercholesterolemia   . Hypertension   . Hypoglycemia associated with diabetes (HCC)   . Obesity   . Tachycardia   . Type 1 diabetes mellitus not at goal [facility](HCC)   . Uncontrolled DM with microalbuminuria or microproteinuria     Family History  Problem Relation Age of Onset  . Dementia Paternal Aunt   . Schizophrenia Paternal Aunt   . Cancer Maternal Grandmother   . Diabetes Maternal Grandmother        T2 DM  . Cancer Maternal Grandfather   . Thyroid disease Neg Hx      Current Outpatient Medications:  .  acetaZOLAMIDE (DIAMOX SEQUELS) 500 MG capsule, Take 1000 mg in am and 500 mg in evening, Disp: 90 capsule, Rfl: 5 .  albuterol (PROAIR HFA) 108 (90 Base) MCG/ACT inhaler, Inhale 2 puffs into the lungs every 6 (six) hours as needed for wheezing or shortness of breath., Disp: 1 Inhaler, Rfl: 2 .  HUMALOG 100 UNIT/ML injection, INJECT 300 UNITS UNDER THE SKIN VIA INSULIN PUMP EVERY 48 TO 72 HOURS PER HYPERGLYCEMIA AND DKA PROTOCOLS., Disp: 40 mL, Rfl: 0 .  losartan (COZAAR) 50 MG tablet, TAKE 1 TABLET(50 MG) BY MOUTH DAILY, Disp: 30 tablet, Rfl: 5 .  Mometasone Furoate (ASMANEX HFA) 200 MCG/ACT AERO, Inhale 2 puffs into the lungs 2 (two) times daily., Disp: 13 g, Rfl: 5 .  montelukast (SINGULAIR) 10 MG tablet, TAKE 1 TABLET(10 MG) BY MOUTH AT BEDTIME, Disp: 30 tablet, Rfl: 4 .  ONE TOUCH ULTRA TEST test strip, USE AS DIRECTED SIX TIMES DAILY, Disp: 200 each, Rfl: 5 .  acetaminophen (TYLENOL) 500 MG tablet, Take 1,000 mg by mouth 2 (two) times daily as needed., Disp: , Rfl:  .  ARNUITY ELLIPTA  200 MCG/ACT AEPB, INHALE 1 PUFF INTO THE LUNGS DAILY (Patient not taking: Reported on 05/31/2018), Disp: 30 each, Rfl: 4 .  AUVI-Q 0.3 MG/0.3ML SOAJ injection, Use as directed for life-threatening allergic reaction. (Patient not taking: Reported on 05/31/2018), Disp: 4 Device, Rfl: 2 .  clonazePAM (KLONOPIN) 0.5 MG tablet, Take 0.5 mg by mouth 2 (two) times daily as needed., Disp: , Rfl: 0 .  ibuprofen (ADVIL,MOTRIN)  600 MG tablet, Take 600 mg by mouth 2 (two) times daily as needed., Disp: , Rfl:  .  nystatin (MYCOSTATIN) 100000 UNIT/ML suspension, Swish and swallow 5 mls 3 times daily for 10 days. (Patient not taking: Reported on 05/31/2018), Disp: 160 mL, Rfl: 0 No current facility-administered medications for this visit.   Facility-Administered Medications Ordered in Other Visits:  .  gadopentetate dimeglumine (MAGNEVIST) injection 20 mL, 20 mL, Intravenous, Once PRN, Dohmeier, Porfirio Mylar, MD  Allergies as of 05/31/2018 - Review Complete 05/31/2018  Allergen Reaction Noted  . Flonase [fluticasone] Shortness Of Breath 06/10/2017  . Sulfites Itching and Swelling 02/04/2018  . Molds & smuts  03/09/2018  . Tetracyclines & related Other (See Comments) 03/09/2018    1. Work and Family: He is now working at home at reduced hours. 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 He hopes to return to full-time work soon. 2. Activities: He has not done much physical activity this calendar year. He was more physically active when he felt good back in November 2018.    3. Smoking, alcohol, or drugs: Occasional beer, about one-two per month. No tobacco or drugs. 4. Primary Care Provider: Dr. Guerry Bruin, MD, [facility] 5. Neurology: Dr. Vickey Huger 6. Ophthalmology: Dr. Luciana Axe 7. Urology: Dr. Annabell Howells at [facility] 8. Sports medicine: Dr. Darrick Penna  REVIEW OF SYSTEMS: There are no other significant problems involving Akhil's other body systems.   Objective:  Vital  Signs:  BP 108/68   Pulse (!) 112   Ht 5' 11.54" (1.817 m)   Wt 258 lb 12.8 oz (117.4 kg)   BMI 35.56 kg/m    Ht Readings from Last 3 Encounters:  05/31/18 5' 11.54" (1.817 m)  04/28/18 5\' 10"  (1.778 m)  03/25/18 6' (1.829 m)   Wt Readings from Last 3 Encounters:  05/31/18 258 lb 12.8 oz (117.4 kg)  03/25/18 256 lb (116.1 kg)  03/18/18 260 lb (117.9 kg)   PHYSICAL EXAM:  Constitutional: The patient appears healthy but tired. His weight has decreased 2 pounds. His affect is normal. His insight is normal.  Face: The face appears normal.  Eyes: He does not have much allergic conjunctivitis. There is no obvious arcus or proptosis. Moisture appears normal.  Mouth: The oropharynx and tongue appear normal. Oral moisture is normal. Neck: The neck appears to be visibly normal. No carotid bruits are noted. He has a short neck. His strap muscles are smaller. The thyroid gland is low lying and is again mildly enlarged at about 21 grams in size. Both lobes are mildly enlarged.  The thyroid gland is not tender to palpation today.  Lungs: The lungs are clear to auscultation. Air movement is good.  Heart: Heart rate and rhythm are regular. Heart sounds S1 and S2 are normal. I did not appreciate any pathologic cardiac murmurs. Abdomen: The abdomen is enlarged. Bowel sounds are normal. There is no obvious hepatomegaly, splenomegaly, or other mass effect. The abdomen is somewhat tender to deep palpation laterally in both sides. Arms: Muscle size and bulk are normal for age. Hands: There is no tremor of his hands today. Phalangeal and metacarpophalangeal joints are normal. Palmar muscles are normal. Palmar skin is normal. Palmar moisture is also normal. Legs: Muscles appear normal for age. No edema is present. Feet: 1+ right DP pulse, 1+ left DP pulse. Neurologic: Strength is normal for age in both the upper and lower extremities. Muscle tone is normal. Sensation to touch is normal  in both legs and both  feet.   LAB DATA:   Labs 05/31/18: HbA1c 6.6%, CBG 202  Labs 03/18/18: CBG 178  Labs 01/13/18: HbA1c 6.8%, CBG 194; TSH 0.98, free T4 1.3; free T3 3.6; CMP normal except glucose 191; urinary microalbumin/creatinine ratio 35 (ref <30)  Labs 11/17/17: CBG 170  Labs 10/13/17: HbA1c 6.8%, CBG 191  Labs 08/24/17: CBG 117  Labs 07/20/17: CBG 216  Labs 07/08/17: IgA, IgG, and IgM normal. CBC normal.  Labs 06/16/17: HbA1c 6.5%, CBG 193  Labs 05/15/17: CBG 209; TSH 0.90, free T4 1.2, free T3 3.8  Labs 03/05/17: HbA1c 7.3%, CBG 241  Labs 02/20/17: CBG 180 fasting  Labs 01/29/17: CBG 148  Labs 01/12/17: CBG 116  Labs 12/29/16: HbA1c 9.1%, CBG 223; TSH 0.62, free T4 1.1, free T3 3.5; urine microalbumin/creatinine ratio 22; cholesterol 171, triglycerides 93, HDL 40, LDL 112;  CMP glucose 201  Labs 09/26/16: HbA1c 9.5%  Labs 08/13/16: Celiac panel negative; CMP normal except for glucose 207; CBC normal  Labs 06/13/16: HbA1c 9.8%; LH 3.6, FSH 2.3, testosterone 533 (ref 250-827), free testosterone 59.4 (ref 47-244)  Labs 02/11/16: HbA1c 9.2%  Labs 01/07/16: HbA1c 10.5%; CBC with Hgb elevated at 17.2%; LH 3.9, FSH 2.0; TSH 1.53, free T4 1.3, free T3 3.7; cholesterol 262, triglycerides 171, HDL 40, LDL 188; urinary microalbumin/creatinine ratio 31; CMP normal except for glucose 181  Labs 06/29/15. HbA1c 9.6%  Labs 12/28/14: Hemoglobin A1c 9.5% today, compared with 9.5%, at last visit and with 9.9% at the prior visit.  Labs 08/03/14: HbA1c 9.5%; TSH 0.548,free T4 1.10, free T3 3.7, TPO antibody < 1, TSI 30; CMP normal except glucose 191; urinary microalbumin/creatinine ratio 27; cholesterol 260, triglycerides 90, HDL 51, LDL 191   Labs 10/05/13: HbA1c 9.9%  Labs 04/23/13: CMP normal, except glucose 144; cholesterol 216, triglycerides 97, HDL 39, LDL 158; urinary microalbumin/creatinine ratio 6.9; TSH 0.505, free T4 1.17, free T3 3.8  Labs: 01/21/12: TSH 1.251, free T4 1.10, free T3 3.6, CBC normal,  CMP normal except for glucose 206, urinary microalbumin/creatinine ratio 6.3, cholesterol 187, triglycerides 67, HDL 45, LDL 129   Assessment and Plan:   ASSESSMENT:  1. T1DM:   A. His BGs are doing quite well overall, but still vary with whether or not he has active allergic rhinitis/sinusitis/bronchitis or other illnesses, whether or not he feels well enough to exercise, and how long he keeps his sites in place. Almost all of his higher BGs in the past month were due to keeping his sites in too long or to delaying boluses in fear of hypoglycemia. The combination of the Medtronic 670G pump and Guardian 3 sensor is again helping him achieve good BG control.   B. Dr. Vickey Huger was worried that Diamox might cause erroneous BG or SG values. Thus far his SGs and BGs have correlated well.  2. Hypoglycemia: He has had no BG <80.  3. Hypertension: SBP and DBP are good today. He needs to continue to take meds regularly and to exercise regularly when he feels well enough to do so.  4. Combined hyperlipidemia: His lipids in March 2017 were too high, but the lipids in March 2018 were much better on atorvastatin. He discontinued the atorvastatin in mid-2018 due to concern that this medication might be causing his neuromuscular symptoms. We will plan to re-start the atorvastatin in the near future once he becomes stable on immunotherapy. We will repeat his lipid panel at that time.  5. Autonomic neuropathy with  tachycardia and gastroparesis: His heart rate has normalized. His BMs have also normalized. 6. Obesity: Weight has decreased a bit. Eating Right and exercise will help. He needs fewer calories and more exercise.      7-8. Goiter/thyroiditis:   A. His thyroid gland is still somewhat enlarged. His thyroiditis is clinically quiescent. The active thyroiditis that he has had in the past and the  process of waxing and waning of thyroid gland size are c/w evolving Hashimoto's thyroiditis.   B. He was euthyroid in  March 2013, but was borderline hyperthyroid in June 2014. He was euthyroid in October 2015, March 2017, March 2018, July 2018, and in March 2019.  9. Fatigue: This problem has worsened after his recent illnesses.  10. Obstructive sleep apnea: He did not meet the criteria for OSA, but because of his snoring a dental appliance was suggested. I recommended that he obtain a dental appliance. Unfortunately, his former dentist no longer accepts [employer], so [name] has to find a new dentist. 11. Adult ADD: He had an appointment with Dr. Piedad Climes, MD in [place] in November 2018. Dr. Shane Crutch is waiting for [name]'s medical situation to stabilize before beginning any new psych meds.   12. Proliferative diabetic retinopathy: This problem is due to his long period of poorly controlled BGs. The retinopathy was worse earlier this month. It will take at least 6-12 months of near-normal BGs for his retinopathy to stabilize.  He will have follow up exam soon.  13. URI/nasal congestion/vertigo/allergic rhinitis and conjunctivitis: He has much less allergic rhinitis and conjunctivitis since starting immunotherapy.    14. Neuromuscular symptoms and headaches/increased intracranial pressure:   A. It appeared previously that some of [name]'s headaches were classic tension headaches. When he performed cervical stretching exercises the headache resolved.   B. In the past when Dr. Vickey Huger evaluated [name] for his vague neuromuscular symptoms, she did not find any signs of any serious neurologic disease. His MRI of his brain was unremarkable. At that time his neuromuscular symptoms had resolved.  C. In the past few months, however, the headaches worsened and some of the neuromuscular symptoms recurred. We now know that he had increased ICP and that his headaches improved for a brief time after the LP. He then developed a typical spinal tap headache.  D. In retrospect he may have had intermittent problems with  elevated ICP previously.  E. Dr. Vickey Huger suspects that [name]'s ICP was due to the doxycycline. She will follow this issue over time.  15. Calcified lymph node: It is possible that he may have contracted a fungus while he was working in the [employer] several years ago. His thyroid US showed his goiter, but no intrinsic thyroid abnormality. A partially calcified cervical lymph node on the right was noted, c/w a post infections/inflammatory node. 16. Microalbuminuria: His ratio was mildly elevated in March 2019. He needs tighter BG and BP control.  PLAN:  1. Diagnostic:  Repeat CBG  today. Call in two weeks on a Sunday or Wednesday evening to discuss BGs.  2. Therapeutic: Follow DM care plan. Continue losartan. Continue his current pump basal rates, ISFs, and BG targets.   Continue current ICRs and other pump settings:  MN: 7 8 AM: 5.00 11 AM: 4.5 9 PM: 7 3. Patient education:   A. We discussed the significant improvements in his allergy-related symptoms since his last visit. We also discussed his recent increased ICP. We discussed the need to make small changes in  his ICRs based upon his eating habits and exercise habits. We also discussed the need for him to continue to be followed closely by Drs Wylene Simmer, Kozlow, Dohmeier, [place], [place], [place], and [place].   [name] told me today that he has a relative who is an ENT physician in [place]. That relative advise him to go to the [facility] in [place], [place] to obtain a multidisciplinary evaluation of all of his problems to see if there might be one unifying cause that we have missed. I told him that it is possible that all of [place] could have missed a unifying cause, but that does not seem to be the case. However, going to [facility] is certainly something that might prove valuable to him. He will check with his medical insurance company to see if his insurance will cover such an evaluation.  4. Follow-up:  2 months  Level of Service: This visit  lasted in excess of 155 minutes. More than 50% of the visit was devoted to counseling.  Molli Knock, MD, CDE Adult and Pediatric Endocrinology  05/31/2018 10:43 AM

## 2018-05-31 NOTE — Patient Instructions (Signed)
Follow up visit in 2 months.  

## 2018-06-02 ENCOUNTER — Ambulatory Visit (INDEPENDENT_AMBULATORY_CARE_PROVIDER_SITE_OTHER): Payer: 59 | Admitting: Family Medicine

## 2018-06-02 ENCOUNTER — Encounter: Payer: Self-pay | Admitting: Family Medicine

## 2018-06-02 VITALS — BP 126/88 | HR 93 | Temp 98.3°F | Resp 16 | Ht 72.0 in | Wt 248.0 lb

## 2018-06-02 DIAGNOSIS — K219 Gastro-esophageal reflux disease without esophagitis: Secondary | ICD-10-CM | POA: Diagnosis not present

## 2018-06-02 DIAGNOSIS — E108 Type 1 diabetes mellitus with unspecified complications: Secondary | ICD-10-CM

## 2018-06-02 DIAGNOSIS — J3089 Other allergic rhinitis: Secondary | ICD-10-CM

## 2018-06-02 DIAGNOSIS — J4541 Moderate persistent asthma with (acute) exacerbation: Secondary | ICD-10-CM | POA: Diagnosis not present

## 2018-06-02 NOTE — Progress Notes (Signed)
[address] Debbora Presto [place] [address] Dept: [phone]  FOLLOW UP NOTE  Patient ID: [name], male    DOB: Dec 25, 1980  Age: 37 y.o. MRN: [id number] Date of Office Visit: 06/02/2018  Assessment  Chief Complaint: Asthma; Dysphagia; Shortness of Breath; and Cough  HPI [name] is a 37 year old male who presents to the clinic for an acute visit. He was last seen in this clinic on 05/11/2018 by Dr. Lucie Leather for evaluation of asthma, allergic rhinitis, and reflux. At that time, he had recently finished Augmentin and had developed a sore throat consistent with thrush for which he was treated with Nystatin oral solution 5 ml swish and swallow 3 times a day for 10 days and then repeated for 10 days.   At today's visit, he reports feeling some minor improvement after taking the antibiotic and using the nystatin. However, his last day of using Nystatin was Friday when he began to feel an increase in his sore throat and experience a feeling of globus or swallowing over a bump. He reports shortness of breath with activity, especially while walking up the stairs.He does not notice wheezing, however, other people tell him he has been wheezing for several weeks. He reports a dry cough that occurs mostly during the nighttime. The cough was productive with white/yellow phlegm yesterday only. He denies fever, sweats, or chills. He continues to use Asmanex 200- 2 puffs twice a day with a spacer, montelukast 10 mg once a day, and he has not used his albuterol inhaler since his last visit to this clinic.  Allergic rhinitis is reported as not well controlled with symptoms including nasal congestion and thick post nasal drainage. He is currently using budesonide nasal rinses about once every 2-3 days with moderate relief of symptoms. He is not currently using nasal saline rinses.   Reflux is reported as moderately well controlled with ranitidine 150 twice a day.   His current medications  are listed in the chart.  Drug Allergies:  Allergies  Allergen Reactions  . Flonase [Fluticasone] Shortness Of Breath  . Sulfites Itching and Swelling  . Molds & Smuts   . Tetracyclines & Related Other (See Comments)    increased intracranial pressure    Physical Exam: BP 126/88   Pulse 93   Temp 98.3 F (36.8 C) (Oral)   Resp 16   Ht 6' (1.829 m)   Wt 248 lb (112.5 kg)   SpO2 98%   BMI 33.63 kg/m    Physical Exam  Constitutional: He is oriented to person, place, and time. He appears well-developed and well-nourished.  HENT:  Head: Normocephalic.  Right Ear: External ear normal.  Left Ear: External ear normal.  Mouth/Throat: Oropharynx is clear and moist.  Bilateral nares erythematous and edematous with clear nasal drainage noted. Pharynx without thrush. Ears normal Eyes normal.   Eyes: Conjunctivae are normal.  Neck: Normal range of motion. Neck supple.  Cardiovascular: Normal rate, regular rhythm and normal heart sounds.  Pulmonary/Chest: Effort normal and breath sounds normal.  Lungs clear to auscultation.  Musculoskeletal: Normal range of motion.  Neurological: He is alert and oriented to person, place, and time.  Skin: Skin is warm and dry.  Psychiatric: He has a normal mood and affect. His behavior is normal. Judgment and thought content normal.  Vitals reviewed.   Diagnostics: FVC 2.60, FEV1 2.33. Predicted FVC 5.72, predicted FEV1 4.58.  Spirometry indicates severe restriction. Post bronchodilator therapy indicates FVC 2.93, predicted FEV1  2.72 with a 17% increase in FEV1.   Assessment and Plan: 1. Moderate persistent asthma with acute exacerbation   2. Gastroesophageal reflux disease, esophagitis presence not specified   3. Other allergic rhinitis   4. Type 1 diabetes mellitus with complication [facility])      Patient Instructions   1. Continue to perform Allergen avoidance measures as best as possible  2. Continue immunotherapy and Auvi-Q  3. Continue  to Treat and prevent inflammation:   A. Asmanex 200 -2 puffs 2 times a day with a spacer   B. montelukast 10 mg tablet once a day  C. Budesonide 0.5 mg in 100 ml nasal saline once a day.    D. Add on Stiolato 2 puffs twice a day 4. Treat reflux:   A. Ranitidine 150 mg twice a day. Limit caffeine.    5. Labs to evaluate for causes of asthma. The results will be available in about 1-2 weeks. We will call as soon as they are available  6. If needed:   A. nasal saline / wash  B. OTC antihistamine  C. Auvi-Q 0.3  D. Proair HFA 2 inhalations every 4-6 hours  E. Ibuprofen  7. Return to clinic in 2 weeks or earlier if problem.     8. Consider ENT referral for recurring sore throat if no improvement.   Return in about 2 weeks (around 06/16/2018).   Oliverio appears to be experiencing another acute episode of airway inflammation. I have added on a LAMA/LABA to his current therapy and collected blood for labs to further evaluate and treat his condition. I will see him back in this office in 2 weeks or sooner. May need ENT evaluation if no improvement in globus and continuous sore throat.   Thank you for the opportunity to care for this patient.  Please do not hesitate to contact me with questions.  Thermon Leyland, FNP [facility]

## 2018-06-02 NOTE — Patient Instructions (Addendum)
  1. Continue to perform Allergen avoidance measures as best as possible  2. Continue immunotherapy and Auvi-Q  3. Continue to Treat and prevent inflammation:   A. Asmanex 200 -2 puffs 2 times a day with a spacer   B. montelukast 10 mg tablet once a day  C. Budesonide 0.5 mg in 100 ml nasal saline once a day.    D. Add on Stiolato 2 puffs twice a day 4. Treat reflux:   A. Ranitidine 150 mg twice a day. Limit caffeine.    5. Labs to evaluate for causes of asthma. The results will be available in about 1-2 weeks. We will call as soon as they are available  6. If needed:   A. nasal saline / wash  B. OTC antihistamine  C. Auvi-Q 0.3  D. Proair HFA 2 inhalations every 4-6 hours  E. Ibuprofen  7. Return to clinic in 2 weeks or earlier if problem.

## 2018-06-03 ENCOUNTER — Encounter: Payer: Self-pay | Admitting: Family Medicine

## 2018-06-04 ENCOUNTER — Encounter: Payer: Self-pay | Admitting: Allergy and Immunology

## 2018-06-04 ENCOUNTER — Telehealth: Payer: Self-pay | Admitting: *Deleted

## 2018-06-04 NOTE — Telephone Encounter (Signed)
I did reach out to patient regarding starting Fasenra as add-on therapy to control his asthma.  I did advise him that his EOS were low but mainly due to repeated oral steroid therapy and can still get Ins. Approval for same.  At this time patient advised he was going to wait to decide when he has follow-up with Dr [name] on 8/21

## 2018-06-06 LAB — CBC WITH DIFFERENTIAL/PLATELET
Basophils Absolute: 0.1 10*3/uL (ref 0.0–0.2)
Basos: 1 %
EOS (ABSOLUTE): 0.1 10*3/uL (ref 0.0–0.4)
EOS: 1 %
HEMATOCRIT: 49.7 % (ref 37.5–51.0)
Hemoglobin: 16.7 g/dL (ref 13.0–17.7)
Immature Grans (Abs): 0 10*3/uL (ref 0.0–0.1)
Immature Granulocytes: 1 %
Lymphocytes Absolute: 2 10*3/uL (ref 0.7–3.1)
Lymphs: 26 %
MCH: 30.5 pg (ref 26.6–33.0)
MCHC: 33.6 g/dL (ref 31.5–35.7)
MCV: 91 fL (ref 79–97)
MONOCYTES: 7 %
MONOS ABS: 0.5 10*3/uL (ref 0.1–0.9)
NEUTROS PCT: 64 %
Neutrophils Absolute: 4.9 10*3/uL (ref 1.4–7.0)
Platelets: 261 10*3/uL (ref 150–450)
RBC: 5.47 x10E6/uL (ref 4.14–5.80)
RDW: 13.7 % (ref 12.3–15.4)
WBC: 7.6 10*3/uL (ref 3.4–10.8)

## 2018-06-06 LAB — IGE: IgE (Immunoglobulin E), Serum: 110 IU/mL (ref 6–495)

## 2018-06-07 ENCOUNTER — Telehealth: Payer: Self-pay | Admitting: *Deleted

## 2018-06-07 MED ORDER — NYSTATIN 100000 UNIT/ML MT SUSP: OROMUCOSAL | 2 refills | Status: DC

## 2018-06-07 NOTE — Telephone Encounter (Signed)
Prescription sent in as written per Dr [name]. Called pharmacy to check on nystatin and back order status states that they have it in stock order sent in

## 2018-06-07 NOTE — Telephone Encounter (Signed)
-----   Message from Jessica PriestEric J Kozlow, MD sent at 06/07/2018 10:02 AM EDT ----- Please provide patient with nystatin oral solution 5 mL swish and swallow every day.  This would be a continuous prescription

## 2018-06-09 ENCOUNTER — Ambulatory Visit: Payer: 59 | Admitting: Neurology

## 2018-06-09 ENCOUNTER — Encounter

## 2018-06-09 ENCOUNTER — Encounter: Payer: Self-pay | Admitting: Neurology

## 2018-06-09 VITALS — BP 122/81 | HR 88 | Ht 72.0 in | Wt 256.0 lb

## 2018-06-09 DIAGNOSIS — G932 Benign intracranial hypertension: Secondary | ICD-10-CM

## 2018-06-09 DIAGNOSIS — J4541 Moderate persistent asthma with (acute) exacerbation: Secondary | ICD-10-CM

## 2018-06-09 DIAGNOSIS — E103593 Type 1 diabetes mellitus with proliferative diabetic retinopathy without macular edema, bilateral: Secondary | ICD-10-CM

## 2018-06-09 DIAGNOSIS — E1069 Type 1 diabetes mellitus with other specified complication: Secondary | ICD-10-CM | POA: Insufficient documentation

## 2018-06-09 DIAGNOSIS — E103553 Type 1 diabetes mellitus with stable proliferative diabetic retinopathy, bilateral: Secondary | ICD-10-CM | POA: Insufficient documentation

## 2018-06-09 NOTE — Patient Instructions (Signed)
Asthma Attack Prevention, Adult Although you may not be able to control the fact that you have asthma, you can take actions to prevent episodes of asthma (asthma attacks). These actions include:  Creating a written plan for managing and treating your asthma attacks (asthma action plan).  Monitoring your asthma.  Avoiding things that can irritate your airways or make your asthma symptoms worse (asthma triggers).  Taking your medicines as directed.  Acting quickly if you have signs or symptoms of an asthma attack.  What are some ways to prevent an asthma attack? Create a plan Work with your health care provider to create an asthma action plan. This plan should include:  A list of your asthma triggers and how to avoid them.  A list of symptoms that you experience during an asthma attack.  Information about when to take medicine and how much medicine to take.  Information to help you understand your peak flow measurements.  Contact information for your health care providers.  Daily actions that you can take to control asthma.  Monitor your asthma  To monitor your asthma:  Use your peak flow meter every morning and every evening for 2-3 weeks. Record the results in a journal. A drop in your peak flow numbers on one or more days may mean that you are starting to have an asthma attack, even if you are not having symptoms.  When you have asthma symptoms, write them down in a journal.  Avoid asthma triggers  Work with your health care provider to find out what your asthma triggers are. This can be done by:  Being tested for allergies.  Keeping a journal that notes when asthma attacks occur and what may have contributed to them.  Asking your health care provider whether other medical conditions make your asthma worse.  Common asthma triggers include:  Dust.  Smoke. This includes campfire smoke and secondhand smoke from tobacco products.  Pet dander.  Trees, grasses or  pollens.  Very cold, dry, or humid air.  Mold.  Foods that contain high amounts of sulfites.  Strong smells.  Engine exhaust and air pollution.  Aerosol sprays and fumes from household cleaners.  Household pests and their droppings, including dust mites and cockroaches.  Certain medicines, including NSAIDs.  Once you have determined your asthma triggers, take steps to avoid them. Depending on your triggers, you may be able to reduce the chance of an asthma attack by:  Keeping your home clean. Have someone dust and vacuum your home for you 1 or 2 times a week. If possible, have them use a high-efficiency particulate arrestance (HEPA) vacuum.  Washing your sheets weekly in hot water.  Using allergy-proof mattress covers and casings on your bed.  Keeping pets out of your home.  Taking care of mold and water problems in your home.  Avoiding areas where people smoke.  Avoiding using strong perfumes or odor sprays.  Avoid spending a lot of time outdoors when pollen counts are high and on very windy days.  Talking with your health care provider before stopping or starting any new medicines.  Medicines Take over-the-counter and prescription medicines only as told by your health care provider. Many asthma attacks can be prevented by carefully following your medicine schedule. Taking your medicines correctly is especially important when you cannot avoid certain asthma triggers. Even if you are doing well, do not stop taking your medicine and do not take less medicine. Act quickly If an asthma attack happens, acting quickly   can decrease how severe it is and how long it lasts. Take these actions:  Pay attention to your symptoms. If you are coughing, wheezing, or having difficulty breathing, do not wait to see if your symptoms go away on their own. Follow your asthma action plan.  If you have followed your asthma action plan and your symptoms are not improving, call your health care  provider or seek immediate medical care at the nearest hospital.  It is important to write down how often you need to use your fast-acting rescue inhaler. You can track how often you use an inhaler in your journal. If you are using your rescue inhaler more often, it may mean that your asthma is not under control. Adjusting your asthma treatment plan may help you to prevent future asthma attacks and help you to gain better control of your condition. How can I prevent an asthma attack when I exercise?  Exercise is a common asthma trigger. To prevent asthma attacks during exercise:  Follow advice from your health care provider about whether you should use your fast-acting inhaler before exercising. Many people with asthma experience exercise-induced bronchoconstriction (EIB). This condition often worsens during vigorous exercise in cold, humid, or dry environments. Usually, people with EIB can stay very active by using a fast-acting inhaler before exercising.  Avoid exercising outdoors in very cold or humid weather.  Avoid exercising outdoors when pollen counts are high.  Warm up and cool down when exercising.  Stop exercising right away if asthma symptoms start.  Consider taking part in exercises that are less likely to cause asthma symptoms such as:  Indoor swimming.  Biking.  Walking.  Hiking.  Playing football.  This information is not intended to replace advice given to you by your health care provider. Make sure you discuss any questions you have with your health care provider. Document Released: 10/01/2009 Document Revised: 06/13/2016 Document Reviewed: 03/29/2016 [employer] Interactive Patient Education  2018 [employer]  

## 2018-06-09 NOTE — Progress Notes (Signed)
SLEEP MEDICINE CLINIC   Provider:  Melvyn Novas, M D  Primary Care Physician:  Gaspar Garbe, MD   Referring Provider: ophthalmologist Dr. Luciana Axe, who is also a retina specialist.   The patient is further followed by endocrinologist Dr. Molli Knock since he is diabetic type I.   Chief Complaint  Patient presents with  . Follow-up    pt alone, rm 10. pt had a HST completed through Dr Toni Arthurs office would like to review. last sleep study completed through [place] was 06/18/17. pt states that the symptoms have improved. he states that he still has some slight ringing in the ears but goes away after sitting up 10-15 min. he says increasing the diamox has helped. headaches are still present he states duration is better and not as often.     HPI:  06-09-2018,  I have the pleasure of meeting with [name] today, he recently had seen Dr. Alveda Reasons dental office and underwent a home sleep test they are which has the same results essentially that he had here in an attended polysomnography from 18 June 2017.  Prior to our test he had a home sleep test at [facility] and was told based on those results that he did have obstructive sleep apnea which I could not confirm.  Neither could Dr. Alveda Reasons home sleep test.  The patient however is a loud snorer and our concern was that the dental device may help him to reduce or treat the snoring.  The dental referral was not based on the presence of obstructive sleep apnea.  He also qualified that the sleep study from Dr. Alveda Reasons office on 24 May 2018 was performed while he had an upper respiratory infection, could not rest and breathes well at baseline and could especially not rest flat and needed several pillows to support him.  He did not have tachybradycardia arrhythmias, he did not have oxygen desaturations of significance, so we are left with discussing how to treat the snoring. insurance will not pay for snoring based dental device.    [name] is also not excessively daytime sleepy and endorsed the Epworth Sleepiness Scale at 8 points, the  fatigue severity score at 58/ 63  points. He is excessively fatigued and this may relate to his DM type 1. He no longer has symptoms of pseudotumor on diamox, has to drink more not to dehydrate. He used to see neon green while he has optic nerve pressure- red vision was unimpaired. He no longer has the green ray in his visual field.  Much less tinnitus on higher diamox dose. Here for refills.       Interval history from 25 Mar 2018, [name] is here today for follow-up visit, he has tolerated the LP very well, his opening pressure was elevated he was asked to discontinue the tetracycline medication and instead started on Diamox.  He reports that both headaches and vision have been much improved, sometimes in the morning he still has a hazy greenish tinge to his visual field the dose away once he takes his medication.  Much less frequent has since been observed in the afternoon or evenings.  He has not to take another antibiotic as his urine sample to look clear and his urologist felt that he was not in need of further therapy. Dr Fransico Michael checked his glucometer readings against his pump, and they tracked well. He plans to travel by plane soon and had additional questions about the ICP and diamox. No  longer ptosis on the right - resolved right after LP. He has not returned to the gym- where he noted both ptosis and greenish vision.  He was sick after a trip to [place]- explained by altitude exacerbation.  Sleep apnea    Avoiding tetracycline, retinoid, high dose vit A . Avoid high altitudes without diamox premedication.   use diamox defore you fly .     03-12-2018,   I have the pleasure of seeing [name] today in a follow-up visit.  I had seen the patient on 14 May when he presented with very concerning data from his ophthalmologist, retro-orbital pressure was identified, and  he was from here referred after physical and neurological exam for an immediate fluoroscopic lumbar puncture.  This [facility], [facility], a successful nontraumatic spinal tap was obtained, between the second and third lumbar vertebra opening pressure was 28 cmH2O.  Except for an elevated glucose level in the CSF there was no other abnormality, no elevated protein no white blood cells or red cells and all cultures thus far have been negative.  After the spinal tap developed abdominal almost spasms, as well as cramping at the chest wall muscles.  By day 3 post spinal tap he has developed typical spinal tap headaches that arise when he sits up or walks sometimes needing about 10 to 15 minutes.  He feels much better laying flat and laying reclining.  In order to treat the spinal tap headaches I will send him for a blood patch, urinary tract infection was ruled out his urinary sample to clean I was concerned since I discontinued his antibiotics.  I still think that the tetracyclines probably tipped the intracranial pressure.  He saw Dr. Luciana Axeankin his retinal specialist yesterday, they were pleased with the results as to his retinal changes, any further procedure from ophthalmology will be pending until gait feels better has less headaches and discomfort.  I also started Diamox at 500 mg twice daily should he developed gastrointestinal side effects from this medication I would like in that case to reduce it to 250 in the morning 250 at night and we can still advance the dose later on after a couple of weeks or so.    03-09-2018, Patient returned for an urgent visit based on vision changes and diabetic changes to the eye, seen by Dr. Luciana Axeankin.  He developed headaches , changes to green tinged vision, some tremor and HA are worse when he in bed, better when he rises. Dynamic intracranial pressure changes He gets nauseated, and worse vision changes when moving. He  feels a vibration and tension in the neck and pressure behind the right eye more than left, he sleeps on his right. History of chronic sinusitis, with tinnitus since 10/ 2017 . He tested negative for OSA. He had initially improved in HA, tinnitus and Vertigo. Tremor could not be objectively seen.  He has been a diabetic Type 1, followed by Dr.  Molli KnockMichael Brennan, with an insulin pump.  MRI brain and sinus CT in the last 6 month. Seen by ENT and allergy specialist.  His upper respiratory infects have decreased, since allergy treatment.   Topical vancomycin and extensive notes over, the last visit with the patient was on May 9 Thursday 2019, the patient had been treated with Avastin injections into the left and right eye, beginning in August 2018 the last right eye injection was on November 09, 2017.  Patient  is currently on doxycycline, Arnuity, Humalog insulin, Klonopin, losartan and montelukast.  Impression was that the patient has controlled diabetes type 1 diabetes mellitus with right eye affected by proliferative retinopathy and macular edema Dr. Luciana Axe documented that he felt this was worsening.  He also stated the same for the left eye also to a slightly lesser degree.  There is also a minor cataract forming in both eyes, there is optic papillitis which has not been graded, ocular pain, and a history of acute sinusitis and retinal hemorrhage he mentions the patient snores and has headaches with valsalva-.  Transient obscuration of vision in both eyes- last  MRI scan was normal at the time/ 2018.   Headaches much worse when bending over, body habitus and doxycycline medication may cause this patient's likely intracranial pressure elevation.  ENT cleared him of sinusitis: CLINICAL DATA:  Chronic sinusitis   08-31-2017, [name] is seen here today for a follow-up visit after his sleep study from 06/18/2017.  The patient again had such a mild degree of apnea that it does not need to be  treated with any  intervention.  His AHI was only 2.6/h not considered pathologically.  His REM sleep AHI was 14.3/h and supine AHI 3.4.  We meet today to also look at his sleep experience since the study.  The patient reports that he still wakes up with trembling in both arms, he has shoulder discomfort which keeps him from sleeping on his side.  However in intervention with CPAP, PLM medication or oxygen is not necessary at this point.  I do not have a good understanding as to why he trembles, and why these trembling spells wake him from sleep.  He has been followed closely for his thyroid function and reports that it has been in the high normal range.  He still reports some palpitations that he experiences at night.  He also reports facial twitching at night. He related the onset to psychiatric medication ( wellbutrin, trazodone). He has year around asthma and allergies.  I have no interventional therapies available.    CONSULT : SLEEP CLINIC:  [name] is a 37 y.o. male , seen here as in a referral/ revisit  from sports medicine and Dr Fransico Michael, ped endocrinologist.  I would like to thank Dr. Fransico Michael for his report notes which helped me today to design further sleep evaluation and treatment for this patient. He also gave me an excellent summary of the past medical problems that [name] has faced. Mr.  [name] was diagnosed with diabetes at age 53 and has been followed by a pediatric endocrinologist, Dr. Fransico Michael. His blood sugars have been better and more stable on an auto mode Medtronic insulin pump. He also faces problems with obesity, hypertension, hyperlipidemia and an autonomic neuropathy with tachycardia and gastroparesis. As blood pressure control had improved so did his heart rate and his postprandial bloating. He has been treated for thyroiditis consistent with an evolving Hashimoto's ultra immune thyroid disease. He was euthyroid again in March 2018 when last checked he had reported fatigue and was  diagnosed by a home sleep test last year at " [facility]", was  supposedly diagnosed with obstructive sleep apnea but had not treatment initiated .  He has proliferative retinopathy, attributed to diabetes. Dr. Fransico Michael also wanted to make sure that current rather newish neuromuscular symptoms beI evaluated -he was especially concerned that this patient is prone to further auto-immune diseases such as central demyelination or multiple sclerosis. Dr. Darrick Penna  in this sport medicine section of [place] had added in this visit on 04/15/2007 but the patient habits and improvement of nausea, vertigo, after discontinuing eyedrops, trazodone and Wellbutrin and atorvastatin. He had more headaches. He felt jittery and tremors of his right hand and right leg were temporary worse. He describes photophobia. He felt shaking more in the morning l right hand and right leg, an inner jitteriness. It has improved. Vertigo not present today.    Sleep habits are as follows:  " Bedtime between 10 and 12 midnight, rises at 7 AM. He describes his sleep is rather restless, trying to find a comfortable sleep position, but she doesn't necessarily use the bed during the night. He does not report nocturia, and only twice or so in the last month has he woke up with palpitations or clamminess, likely related to hypoglycemia. He is known to snore, and he had a home sleep test at [facility] last year in [facility] and was told that he has obstructive sleep apnea. Treatment was not initiated yet. He wakes up not feeling restored or refreshed, and he has noticed that his jittery illness affecting the right extremities and a feeling of inner trembling of his torso is the worst in the morning hours. It doesn't last that long. He also has a pulsating change of vision when he first gets up and he suffered from vertigo for much of the last 3 months. No daytime naps, he wouldn't sleep at night if he did.   Sleep medical history and  family sleep history:  Dad snoring, mom is an insomniac. He remembers having problems sleeping even in childhood. He has felt fatigued and not restored for several months. Social history: works as Games developer. The patient is single, no children. No tobacco use, seldom alcohol use,  4 months ago he stopped drinking caffeine due to the jitteriness, and headaches with photophobia and a feeling of the head being full could also be relating to sinusitis.    Review of Systems: Out of a complete 14 system review, the patient complains of only the following symptoms, and all other reviewed systems are negative.  Patient has shoulder pain and trembling in both arms, feels tight, achy. Not shooting pains, not electric sensation. Soreness in shoulder muscles, affecting sleep position, tinnitus improved, color vision normalized in Diamox.   Epworth score 8 Fatigue severity score 58/ 63 - Is higher than last visit  , depression score n/a    Social History   Socioeconomic History  . Marital status: Single    Spouse name: Not on file  . Number of children: Not on file  . Years of education: Not on file  . Highest education level: Not on file  Occupational History  . Not on file  Social Needs  . Financial resource strain: Not on file  . Food insecurity:    Worry: Not on file    Inability: Not on file  . Transportation needs:    Medical: Not on file    Non-medical: Not on file  Tobacco Use  . Smoking status: Never Smoker  . Smokeless tobacco: Never Used  Substance and Sexual Activity  . Alcohol use: No  . Drug use: No  . Sexual activity: Not on file  Lifestyle  . Physical activity:    Days per week: Not on file    Minutes per session: Not on file  . Stress: Not on file  Relationships  . Social connections:  Talks on phone: Not on file    Gets together: Not on file    Attends religious service: Not on file    Active member of club or organization: Not on file    Attends  meetings of clubs or organizations: Not on file    Relationship status: Not on file  . Intimate partner violence:    Fear of current or ex partner: Not on file    Emotionally abused: Not on file    Physically abused: Not on file    Forced sexual activity: Not on file  Other Topics Concern  . Not on file  Social History Narrative   ** Merged History Encounter **        Family History  Problem Relation Age of Onset  . Dementia Paternal Aunt   . Schizophrenia Paternal Aunt   . Cancer Maternal Grandmother   . Diabetes Maternal Grandmother        T2 DM  . Cancer Maternal Grandfather   . Thyroid disease Neg Hx     Past Medical History:  Diagnosis Date  . ADHD (attention deficit hyperactivity disorder)   . Autonomic neuropathy due to diabetes (HCC)   . Diabetes (HCC)   . Fatigue   . Goiter   . High blood pressure   . High cholesterol   . Hypercholesterolemia   . Hypertension   . Hypoglycemia associated with diabetes (HCC)   . Obesity   . Tachycardia   . Type 1 diabetes mellitus not at goal [facility])   . Uncontrolled DM with microalbuminuria or microproteinuria     Past Surgical History:  Procedure Laterality Date  . none    . WISDOM TOOTH EXTRACTION      Current Outpatient Medications  Medication Sig Dispense Refill  . acetaminophen (TYLENOL) 500 MG tablet Take 1,000 mg by mouth 2 (two) times daily as needed.    [name] acetaZOLAMIDE (DIAMOX SEQUELS) 500 MG capsule Take 1000 mg in am and 500 mg in evening 90 capsule 5  . albuterol (PROAIR HFA) 108 (90 Base) MCG/ACT inhaler Inhale 2 puffs into the lungs every 6 (six) hours as needed for wheezing or shortness of breath. 1 Inhaler 2  . AUVI-Q 0.3 MG/0.3ML SOAJ injection Use as directed for life-threatening allergic reaction. 4 Device 2  . clonazePAM (KLONOPIN) 0.5 MG tablet Take 0.5 mg by mouth 2 (two) times daily as needed.  0  . HUMALOG 100 UNIT/ML injection INJECT 300 UNITS UNDER THE SKIN VIA INSULIN PUMP EVERY 48 TO 72 HOURS  PER HYPERGLYCEMIA AND DKA PROTOCOLS. 40 mL 0  . ibuprofen (ADVIL,MOTRIN) 600 MG tablet Take 600 mg by mouth 2 (two) times daily as needed.    [name] losartan (COZAAR) 50 MG tablet TAKE 1 TABLET(50 MG) BY MOUTH DAILY 30 tablet 5  . Mometasone Furoate (ASMANEX HFA) 200 MCG/ACT AERO Inhale 2 puffs into the lungs 2 (two) times daily. 13 g 5  . montelukast (SINGULAIR) 10 MG tablet TAKE 1 TABLET(10 MG) BY MOUTH AT BEDTIME 30 tablet 4  . nystatin (MYCOSTATIN) 100000 UNIT/ML suspension Swish and swallow 5 mls daily 150 mL 2  . ONE TOUCH ULTRA TEST test strip USE AS DIRECTED SIX TIMES DAILY 200 each 5  . ranitidine (ZANTAC) 150 MG tablet   5  . ARNUITY ELLIPTA 200 MCG/ACT AEPB INHALE 1 PUFF INTO THE LUNGS DAILY (Patient not taking: Reported on 05/31/2018) 30 each 4   No current facility-administered medications for this visit.    Facility-Administered Medications  Ordered in Other Visits  Medication Dose Route Frequency Provider Last Rate Last Dose  . gadopentetate dimeglumine (MAGNEVIST) injection 20 mL  20 mL Intravenous Once PRN Eliyohu Class, Porfirio Mylar, MD        Allergies as of 06/09/2018 - Review Complete 06/09/2018  Allergen Reaction Noted  . Flonase [fluticasone] Shortness Of Breath 06/10/2017  . Sulfites Itching and Swelling 02/04/2018  . Molds & smuts  03/09/2018  . Tetracyclines & related Other (See Comments) 03/09/2018    Vitals: BP 122/81   Pulse 88   Ht 6' (1.829 m)   Wt 256 lb (116.1 kg)   BMI 34.72 kg/m  Last Weight:  Wt Readings from Last 1 Encounters:  06/09/18 256 lb (116.1 kg)   ZOX:WRUE mass index is 34.72 kg/m.     Last Height:   Ht Readings from Last 1 Encounters:  06/09/18 6' (1.829 m)    Physical exam:  General: The patient is awake, alert and appears not in acute distress. The patient is well groomed. Head: Normocephalic, atraumatic. Neck is supple. Mallampati 5-  ,  neck circumference:21. Nasal airflow patent , TMJ is  Not  evident . Retrognathia is not seen. Full  facial hair.  Cardiovascular:  Regular rate and rhythm , without  murmurs or carotid bruit, and without distended neck veins. Respiratory:  Voice is hoarse, used inhaler right before visit. He has wheezing and had rhonchi., has been on steroids, antibiotics.  Skin:  Without evidence of edema, or rash Trunk: BMI is elevated. The patient's posture is erect   Neurologic exam : The patient is awake and alert, oriented to place and time.   Memory subjective described as intact.  Attention span & concentration ability appears normal.  Speech is fluent, without dysarthria, dysphonia or aphasia.  Mood and affect are appropriate.  Cranial nerves: Pupils are equal and briskly reactive to light. Pale retinae bilaterally, yellow tinged - has neovascular proliferation in both eyes. Laser surgical scars visible. Extraocular movements in vertical and horizontal planes  without nystagmus.  Hearing to finger rub intact. Facial sensation intact to fine touch. Facial motor  symmetric and tongue / uvula move midline. Shoulder shrug was symmetrical.  Motor exam:  Normal tone, muscle bulk and symmetric strength in all extremities. He has less tension over the neck, worked with PT.  Sensory:  Fine touch, pinprick normal in feet , but he has feeling of vibration in feet and hands. . Coordination: Rapid alternating movements /Finger-to-nose maneuver  normal without evidence of ataxia, dysmetria or tremor. He is restless   NO TREMOR seen here, today . Gait and station: Patient walks without assistive device . Strength within normal limits. Stance is stable and normal.   Deep tendon reflexes: in the upper and lower extremities are symmetric and intact. Babinski maneuver response is  downgoing.  Assessment:  After physical and neurologic examination, review of laboratory studies,  Personal review of imaging studies, reports of other /same  Imaging studies, results of polysomnography and / or neurophysiology testing and  pre-existing records as far as provided in visit., my assessment is   1)  intracranial hypertension in a young diabetic on tetracycline ( one of the most ICP producing medications) confirmed by LP. Now on diamox.   2) MRI brain was normal 8 month ago -.   3) his sleep evaluation has been completed, no apnea, mild PLMs and no hypoxemia. Snoring was mild- moderate. No need for CPAP, he may pursue a dental device. He has breathing  difficulties from allergic asthma, orthopnea chronic and acute superimposed, grade 2. HST without hypoxemia, even while having asthma.      The patient was advised of the nature of the diagnosed disorder , the treatment options and the risks for general health and wellness arising from not treating the condition.I spent more than 35  minutes of face to face time with the patient.  Greater than 50% of time was spent in counseling and coordination of care. We have discussed the diagnosis and differential and I answered the patient's questions.    Plan:  Treatment plan and additional workup :   Refilled diamox higher dose since April. I hope [name] can find relief from his respiratory problems, and we can then ask for a dental device for snoring.    Melvyn NovasARMEN Whitni Pasquini, MD 06/09/2018, 11:11 AM  Certified in Neurology by ABPN Certified in Sleep Medicine by [facility]  [facility] [address], [address] [place], [place] [id number]

## 2018-06-10 ENCOUNTER — Ambulatory Visit (INDEPENDENT_AMBULATORY_CARE_PROVIDER_SITE_OTHER): Payer: 59 | Admitting: *Deleted

## 2018-06-10 DIAGNOSIS — J309 Allergic rhinitis, unspecified: Secondary | ICD-10-CM | POA: Diagnosis not present

## 2018-06-14 LAB — ALPHA-1-ANTITRYPSIN DEFICIENCY

## 2018-06-14 LAB — ALLERGEN A FUMIGATUS IGG: ASPERGILLUS FUMIGATUS IGG: 22.1 ug/mL — AB (ref 0.0–1.9)

## 2018-06-15 ENCOUNTER — Ambulatory Visit: Payer: 59 | Admitting: Allergy and Immunology

## 2018-06-16 ENCOUNTER — Telehealth: Payer: Self-pay | Admitting: *Deleted

## 2018-06-16 ENCOUNTER — Encounter: Payer: Self-pay | Admitting: Allergy and Immunology

## 2018-06-16 ENCOUNTER — Ambulatory Visit (INDEPENDENT_AMBULATORY_CARE_PROVIDER_SITE_OTHER): Payer: 59 | Admitting: Allergy and Immunology

## 2018-06-16 VITALS — BP 126/68 | HR 87 | Resp 18

## 2018-06-16 DIAGNOSIS — K219 Gastro-esophageal reflux disease without esophagitis: Secondary | ICD-10-CM | POA: Diagnosis not present

## 2018-06-16 DIAGNOSIS — J3089 Other allergic rhinitis: Secondary | ICD-10-CM

## 2018-06-16 DIAGNOSIS — J4550 Severe persistent asthma, uncomplicated: Secondary | ICD-10-CM | POA: Diagnosis not present

## 2018-06-16 MED ORDER — DUPILUMAB 300 MG/2ML ~~LOC~~ SOSY
600.0000 mg | PREFILLED_SYRINGE | Freq: Once | SUBCUTANEOUS | Status: AC
  Administered 2018-06-16: 600 mg via SUBCUTANEOUS

## 2018-06-16 NOTE — Telephone Encounter (Signed)
[name] Dr [name] started patient on dupixent he states to use steroid dependent asthma dx for insurance

## 2018-06-16 NOTE — Telephone Encounter (Signed)
Got it will submit 

## 2018-06-16 NOTE — Patient Instructions (Addendum)
  1. Continue to perform Allergen avoidance measures as best as possible  2. Continue immunotherapy and Auvi-Q  3. Continue to Treat and prevent inflammation:   A. Asmanex 200 -2 puffs 2 times a day with a spacer   B. montelukast 10 mg tablet once a day  C. Budesonide 0.5 mg in 100 ml nasal saline once a day.    D. Start dupilumab today and every 2 weeks   4. Treat reflux:   A. Ranitidine 150 - 300 mg in evening  B. Omeprazole 20mg  - in morning  5. Continue nystatin oral solution 5 mL swish and swallow every day  6. If needed:   A. nasal saline / wash  B. OTC antihistamine  C. Auvi-Q 0.3  D. Proair HFA 2 inhalations every 4-6 hours  E. Ibuprofen  7. Return to clinic in 4 weeks or earlier if problem.

## 2018-06-16 NOTE — Progress Notes (Signed)
Immunotherapy   Patient Details  Name: [name] MRN: [id number] Date of Birth: 01/26/1981  06/16/2018  [name] started injections for  Dupixent Following schedule: every 2 weeks  Epi-Pen:Epi-Pen Available  Consent signed and patient instructions given. Sample Dupixent given Loading dose 600 mg given to patient in office. Patient tolerated injection well with no systemic or local reactions.    [name] 06/16/2018, 2:25 PM

## 2018-06-16 NOTE — Progress Notes (Signed)
Follow-up Note  Referring Provider: Gaspar Garbe, MD Primary Provider: Gaspar Garbe, MD Date of Office Visit: 06/16/2018  Subjective:   [name] (DOB: 08-01-1981) is a 37 y.o. male who [facility] in re-evaluation of the following:  HPI: [name] presents to this clinic in reevaluation of his asthma and allergic rhinitis and reflux.  I last saw him in this clinic 11 May 2018.  [name] has had lots of problems with his respiratory tract over the course of the past 2 years and in 2019 has received 3 systemic steroids in the treatment of his respiratory condition.  Even in the face of receiving systemic steroids and utilizing a large collection of anti-inflammatory medications for his respiratory tract on a chronic basis he still continues to have issues with shortness of breath and coughing and feeling as though his lungs are full.  In addition, he still suffers from having nasal congestion to some degree although he can smell without any difficulty.  He has been intolerant of lots of different inhaled medications and is now relying on the use of Asmanex as well as a leukotriene modifier and nasal budesonide washes to address his respiratory tract inflammation.  He still continues to have issues with reflux.  He has regurgitation even while utilizing ranitidine twice a day.  He does not drink any caffeine.  He has never had a gastric emptying study that he can remember.  The issue with thrush appears to be under very good control at this point in time even while using his Asmanex as long as he remains on daily oral nystatin swish and swallow.  Immunotherapy has been going well without any adverse effects.  Allergies as of 06/16/2018      Reactions   Flonase [fluticasone] Shortness Of Breath   Sulfites Itching, Swelling   Molds & Smuts    Tetracyclines & Related Other (See Comments)   increased intracranial pressure        Medication List      acetaZOLAMIDE 500 MG capsule Commonly known as:  DIAMOX Take 1000 mg in am and 500 mg in evening   albuterol 108 (90 Base) MCG/ACT inhaler Commonly known as:  PROVENTIL HFA;VENTOLIN HFA Inhale 2 puffs into the lungs every 6 (six) hours as needed for wheezing or shortness of breath.   AUVI-Q 0.3 mg/0.3 mL Soaj injection Generic drug:  EPINEPHrine Use as directed for life-threatening allergic reaction.   clonazePAM 0.5 MG tablet Commonly known as:  KLONOPIN Take 0.5 mg by mouth 2 (two) times daily as needed.   HUMALOG 100 UNIT/ML injection Generic drug:  insulin lispro INJECT 300 UNITS UNDER THE SKIN VIA INSULIN PUMP EVERY 48 TO 72 HOURS PER HYPERGLYCEMIA AND DKA PROTOCOLS.   ibuprofen 600 MG tablet Commonly known as:  ADVIL,MOTRIN Take 600 mg by mouth 2 (two) times daily as needed.   losartan 50 MG tablet Commonly known as:  COZAAR TAKE 1 TABLET(50 MG) BY MOUTH DAILY   Mometasone Furoate 200 MCG/ACT Aero Inhale 2 puffs into the lungs 2 (two) times daily.   montelukast 10 MG tablet Commonly known as:  SINGULAIR TAKE 1 TABLET(10 MG) BY MOUTH AT BEDTIME   nystatin 100000 UNIT/ML suspension Commonly known as:  MYCOSTATIN Swish and swallow 5 mls daily   ONE TOUCH ULTRA TEST test strip Generic drug:  glucose blood USE AS DIRECTED SIX TIMES DAILY   ranitidine 150 MG tablet Commonly known as:  ZANTAC   TYLENOL 500 MG tablet Generic drug:  acetaminophen Take 1,000 mg by mouth 2 (two) times daily as needed.       Past Medical History:  Diagnosis Date  . ADHD (attention deficit hyperactivity disorder)   . Autonomic neuropathy due to diabetes (HCC)   . Diabetes (HCC)   . Fatigue   . Goiter   . High blood pressure   . High cholesterol   . Hypercholesterolemia   . Hypertension   . Hypoglycemia associated with diabetes (HCC)   . Obesity   . Tachycardia   . Type 1 diabetes mellitus not at goal [facility](HCC)   . Uncontrolled DM with microalbuminuria  or microproteinuria     Past Surgical History:  Procedure Laterality Date  . none    . WISDOM TOOTH EXTRACTION      Review of systems negative except as noted in HPI / PMHx or noted below:  Review of Systems  Constitutional: Negative.   HENT: Negative.   Eyes: Negative.   Respiratory: Negative.   Cardiovascular: Negative.   Gastrointestinal: Negative.   Genitourinary: Negative.   Musculoskeletal: Negative.   Skin: Negative.   Neurological: Negative.   Endo/Heme/Allergies: Negative.   Psychiatric/Behavioral: Negative.      Objective:   Vitals:   06/16/18 1055  BP: 126/68  Pulse: 87  Resp: 18  SpO2: 97%          Physical Exam  HENT:  Head: Normocephalic.  Right Ear: Tympanic membrane, external ear and ear canal normal.  Left Ear: Tympanic membrane, external ear and ear canal normal.  Nose: Nose normal. No mucosal edema or rhinorrhea.  Mouth/Throat: Uvula is midline, oropharynx is clear and moist and mucous membranes are normal. No oropharyngeal exudate.  Eyes: Conjunctivae are normal.  Neck: Trachea normal. No tracheal tenderness present. No tracheal deviation present. No thyromegaly present.  Cardiovascular: Normal rate, regular rhythm, S1 normal, S2 normal and normal heart sounds.  No murmur heard. Pulmonary/Chest: Breath sounds normal. No stridor. No respiratory distress. He has no wheezes. He has no rales.  Musculoskeletal: He exhibits no edema.  Lymphadenopathy:       Head (right side): No tonsillar adenopathy present.       Head (left side): No tonsillar adenopathy present.    He has no cervical adenopathy.  Neurological: He is alert.  Skin: No rash noted. He is not diaphoretic. No erythema. Nails show no clubbing.    Diagnostics:    Spirometry was performed and demonstrated an FEV1 of 2.54 at 55 % of predicted.  The patient had an Asthma Control Test with the following results: ACT Total Score: 14.    Assessment and Plan:   1. Not well  controlled severe persistent asthma   2. Perennial allergic rhinitis   3. Gastroesophageal reflux disease, esophagitis presence not specified     1. Continue to perform Allergen avoidance measures as best as possible  2. Continue immunotherapy and Auvi-Q  3. Continue to Treat and prevent inflammation:   A. Asmanex 200 -2 puffs 2 times a day with a spacer   B. montelukast 10 mg tablet once a day  C. Budesonide 0.5 mg in 100 ml nasal saline once a day.    D. Start dupilumab today and every 2 weeks   4. Treat reflux:   A. Ranitidine 150 - 300 mg in evening  B. Omeprazole 20mg  - in morning  5. Continue nystatin oral solution 5 mL swish and swallow every day  6.  If needed:   A. nasal saline / wash  B. OTC antihistamine  C. Auvi-Q 0.3  D. Proair HFA 2 inhalations every 4-6 hours  E. Ibuprofen  7. Return to clinic in 4 weeks or earlier if problem.     Joseph BeachGabe basically has steroid-dependent asthma given the fact that he is required 3 prolonged systemic steroid courses over the course of the past 6 months to treat his asthma with less than adequate results and continues on a large collection of anti-inflammatory medications to address this issue.  We will now start him on dupilumab which he will utilize every 2 weeks with home administration.  As well, he still continues to have issues with reflux and I will add in a proton pump inhibitor to his H2 receptor blocker.  I suspect he probably has a component of gastroparesis given his previously diagnosed autonomic dysfunction.  Laurette SchimkeEric Kozlow, MD Allergy / Immunology [facility]

## 2018-06-17 ENCOUNTER — Encounter: Payer: Self-pay | Admitting: Allergy and Immunology

## 2018-06-24 ENCOUNTER — Ambulatory Visit (INDEPENDENT_AMBULATORY_CARE_PROVIDER_SITE_OTHER): Payer: 59 | Admitting: *Deleted

## 2018-06-24 DIAGNOSIS — J309 Allergic rhinitis, unspecified: Secondary | ICD-10-CM | POA: Diagnosis not present

## 2018-06-29 ENCOUNTER — Other Ambulatory Visit (INDEPENDENT_AMBULATORY_CARE_PROVIDER_SITE_OTHER): Payer: Self-pay | Admitting: "Endocrinology

## 2018-06-29 DIAGNOSIS — E1065 Type 1 diabetes mellitus with hyperglycemia: Principal | ICD-10-CM

## 2018-06-29 DIAGNOSIS — IMO0001 Reserved for inherently not codable concepts without codable children: Secondary | ICD-10-CM

## 2018-06-30 ENCOUNTER — Ambulatory Visit (INDEPENDENT_AMBULATORY_CARE_PROVIDER_SITE_OTHER): Payer: 59

## 2018-06-30 DIAGNOSIS — J4550 Severe persistent asthma, uncomplicated: Secondary | ICD-10-CM

## 2018-06-30 NOTE — Progress Notes (Signed)
I will be happy to refer [name] to a sleep dentist.    Melvyn Novas, MD

## 2018-06-30 NOTE — Progress Notes (Signed)
I appreciate the assessment and plan .The patient is known to me .   Dalicia Kisner, MD

## 2018-06-30 NOTE — Progress Notes (Signed)
Patient stated that after he received his Dupixent two weeks ago he experienced burning to the palms of his hands after talk with DR. Dellis Anes he feels this was not due to his Dupixent injection. Informed patient that if he experienced it again after this injection to let us know.

## 2018-07-02 ENCOUNTER — Telehealth: Payer: Self-pay

## 2018-07-02 NOTE — Telephone Encounter (Signed)
Patient stated that after he received his Dupixent on 06/24/2018 he experienced burning to the palms of his hands after talk with DR. Dellis Anes he feels this was not due to his Dupixent injection. Informed patient that if he experienced it again after this injection to let us know. Patient came in today 07/02/2018 to inform me that after his Dupixent injection on 06/30/2018 he experienced the same burning to palms of hands as well as his right arm. He stated he also experienced his arm and leg cramping. Patient is concerned that this is due to the injection and he doesn't think he wants to continue on the injections.

## 2018-07-05 NOTE — Telephone Encounter (Signed)
Patient does not want to continue Dupixent due to this reaction.

## 2018-07-06 ENCOUNTER — Other Ambulatory Visit: Payer: Self-pay

## 2018-07-06 ENCOUNTER — Encounter (HOSPITAL_COMMUNITY): Payer: Self-pay | Admitting: Emergency Medicine

## 2018-07-06 ENCOUNTER — Emergency Department (HOSPITAL_COMMUNITY)
Admission: EM | Admit: 2018-07-06 | Discharge: 2018-07-06 | Discharge status: Against Medical Advice | Payer: 59 | Attending: Emergency Medicine | Admitting: Emergency Medicine

## 2018-07-06 DIAGNOSIS — Z9641 Presence of insulin pump (external) (internal): Secondary | ICD-10-CM | POA: Diagnosis not present

## 2018-07-06 DIAGNOSIS — Z794 Long term (current) use of insulin: Secondary | ICD-10-CM | POA: Diagnosis not present

## 2018-07-06 DIAGNOSIS — Z5321 Procedure and treatment not carried out due to patient leaving prior to being seen by health care provider: Secondary | ICD-10-CM | POA: Insufficient documentation

## 2018-07-06 DIAGNOSIS — E11649 Type 2 diabetes mellitus with hypoglycemia without coma: Secondary | ICD-10-CM | POA: Diagnosis present

## 2018-07-06 LAB — CBG MONITORING, ED: GLUCOSE-CAPILLARY: 136 mg/dL — AB (ref 70–99)

## 2018-07-06 NOTE — Progress Notes (Signed)
Labs have been discussed with this patient and dupilumab has been started.

## 2018-07-06 NOTE — ED Triage Notes (Signed)
Pt reports he was feeling shaky and lightheaded about 10 minutes ago. Symptoms are starting to resolve, pt reports taking glucose tablets and drinking a bunch of OJ. Pt reports he is starting to feel better. Pt is A&Ox4. Denies pain. Hx of diabetes with insulin pump.

## 2018-07-06 NOTE — ED Notes (Signed)
Pt came to [facility] and stated his blood sugar was fine now, he was leaving. Encouraged pt to continue to check CBG and return if he is unable to keep his blood sugar up.

## 2018-07-12 DIAGNOSIS — J4520 Mild intermittent asthma, uncomplicated: Secondary | ICD-10-CM | POA: Insufficient documentation

## 2018-07-14 ENCOUNTER — Ambulatory Visit (INDEPENDENT_AMBULATORY_CARE_PROVIDER_SITE_OTHER): Payer: 59 | Admitting: Allergy and Immunology

## 2018-07-14 ENCOUNTER — Encounter: Payer: Self-pay | Admitting: Allergy and Immunology

## 2018-07-14 ENCOUNTER — Ambulatory Visit: Payer: 59

## 2018-07-14 VITALS — BP 124/76 | HR 96 | Resp 18

## 2018-07-14 DIAGNOSIS — K219 Gastro-esophageal reflux disease without esophagitis: Secondary | ICD-10-CM

## 2018-07-14 DIAGNOSIS — J4550 Severe persistent asthma, uncomplicated: Secondary | ICD-10-CM | POA: Diagnosis not present

## 2018-07-14 DIAGNOSIS — J3089 Other allergic rhinitis: Secondary | ICD-10-CM

## 2018-07-14 DIAGNOSIS — Z91018 Allergy to other foods: Secondary | ICD-10-CM | POA: Diagnosis not present

## 2018-07-14 DIAGNOSIS — K3184 Gastroparesis: Secondary | ICD-10-CM

## 2018-07-14 DIAGNOSIS — K30 Functional dyspepsia: Secondary | ICD-10-CM

## 2018-07-14 MED ORDER — AUVI-Q 0.3 MG/0.3ML IJ SOAJ: INTRAMUSCULAR | 2 refills | Status: DC

## 2018-07-14 MED ORDER — RANITIDINE HCL 300 MG PO CAPS: 300.0000 mg | ORAL_CAPSULE | Freq: Every evening | ORAL | 5 refills | Status: DC

## 2018-07-14 MED ORDER — BUDESONIDE 0.5 MG/2ML IN SUSP: 0.5000 mg | RESPIRATORY_TRACT | 3 refills | Status: DC

## 2018-07-14 MED ORDER — ALBUTEROL SULFATE HFA 108 (90 BASE) MCG/ACT IN AERS: 2.0000 | INHALATION_SPRAY | Freq: Four times a day (QID) | RESPIRATORY_TRACT | 2 refills | Status: DC | PRN

## 2018-07-14 NOTE — Patient Instructions (Addendum)
  1. Continue to perform Allergen avoidance measures as best as possible  2. Continue immunotherapy and Auvi-Q  3. Continue to Treat and prevent inflammation:   A. Asmanex 200 -2 puffs 2 times a day with a spacer   B. montelukast 10 mg tablet once a day  C. Budesonide 0.5 mg in 100 ml nasal saline once a day.     4. Treat reflux:   A. Ranitidine 150 - 300 mg in evening  B. Try OTC Omeprazole 20mg  - in morning  5. Continue nystatin oral solution 5 mL swish and swallow every day  6. If needed:   A. nasal saline / wash  B. OTC antihistamine  C. Auvi-Q 0.3  D. Proair HFA 2 inhalations every 4-6 hours  E. Ibuprofen  7. Obtain GI evaluation for reflux  8. Obtain gastric emptying scan for delayed gastric emptying  9. Return to clinic in 12 weeks or earlier if problem.    10. Obtain fall flu vaccine

## 2018-07-14 NOTE — Progress Notes (Signed)
Follow-up Note  Referring Provider: Gaspar Garbe, MD Primary Provider: Gaspar Garbe, MD Date of Office Visit: 07/14/2018  Subjective:   [name] (DOB: [birth year]) is a 37 y.o. male who [facility] in re-evaluation of the following:  HPI: [name] presents to this clinic in evaluation of asthma and allergic rhinitis and reflux.  I last saw him in this clinic 16 June 2018 at which point in time we started him on dupilumab.  He unfortunately had side effects with the use of dupilumab.  After the first dose he had burning hands within 5 minutes and just felt kind of odd and had head pressure that went on for a week.  With a second injection he had extreme right arm burning and head pounding and just felt terrible for a week or so.  He had burning of his Forearms That Went on for Days and His Hands Were Tender.  Fortunately, That Has All Resolved at This Point.  He is really doing well regarding his respiratory tract at this point.  He has very little issues with shortness of breath or wheezing and he does not need to use a short acting bronchodilator.  His nose is doing relatively well.  He uses a combination of anti-inflammatory agents for his respiratory tract and has not had any adverse effect from utilizing these agents including Asmanex and montelukast and nasal budesonide wash.  His reflux is still in mess.  He has had a messed up gut for 3 to 4 months.  He has bloating and regurgitation and burning.  We attempted to have him use of omeprazole during his last visit but for some reason he never started that medication but he does continue on ranitidine.  His use of Diamox may have been initiated around the same time that his gut became disturbed.  Allergies as of 07/14/2018      Reactions   Flonase [fluticasone] Shortness Of Breath   Sulfites Itching, Swelling   Molds & Smuts    Tetracyclines & Related Other (See Comments)    increased intracranial pressure      Medication List      acetaZOLAMIDE 500 MG capsule Commonly known as:  DIAMOX Take 1000 mg in am and 500 mg in evening   albuterol 108 (90 Base) MCG/ACT inhaler Commonly known as:  PROVENTIL HFA;VENTOLIN HFA Inhale 2 puffs into the lungs every 6 (six) hours as needed for wheezing or shortness of breath.   AUVI-Q 0.3 mg/0.3 mL Soaj injection Generic drug:  EPINEPHrine Use as directed for life-threatening allergic reaction.   budesonide 0.5 MG/2ML nebulizer solution Commonly known as:  PULMICORT Take 2 mLs (0.5 mg total) by nebulization as directed.   clonazePAM 0.5 MG tablet Commonly known as:  KLONOPIN Take 0.5 mg by mouth 2 (two) times daily as needed.   HUMALOG 100 UNIT/ML injection Generic drug:  insulin lispro INJECT 300 UNITS UNDER THE SKIN VIA INSULIN PUMP EVERY 48-72 HOURS PER HYPERGLYCEMIA AND DKA PROTOCOLS   ibuprofen 600 MG tablet Commonly known as:  ADVIL,MOTRIN Take 600 mg by mouth 2 (two) times daily as needed.   losartan 50 MG tablet Commonly known as:  COZAAR TAKE 1 TABLET(50 MG) BY MOUTH DAILY   Mometasone Furoate 200 MCG/ACT Aero Inhale 2 puffs into the lungs 2 (two) times daily.   montelukast 10 MG tablet Commonly known as:  SINGULAIR TAKE 1 TABLET(10 MG) BY MOUTH AT BEDTIME  nystatin 100000 UNIT/ML suspension Commonly known as:  MYCOSTATIN Swish and swallow 5 mls daily   ONE TOUCH ULTRA TEST test strip Generic drug:  glucose blood USE AS DIRECTED SIX TIMES DAILY   ranitidine 150 MG tablet Commonly known as:  ZANTAC   ranitidine 300 MG capsule Commonly known as:  ZANTAC Take 1 capsule (300 mg total) by mouth every evening.   TYLENOL 500 MG tablet Generic drug:  acetaminophen Take 1,000 mg by mouth 2 (two) times daily as needed.       Past Medical History:  Diagnosis Date  . ADHD (attention deficit hyperactivity disorder)   . Autonomic neuropathy due to diabetes (HCC)   . Diabetes (HCC)     . Fatigue   . Goiter   . High blood pressure   . High cholesterol   . Hypercholesterolemia   . Hypertension   . Hypoglycemia associated with diabetes (HCC)   . Obesity   . Tachycardia   . Type 1 diabetes mellitus not at goal [facility])   . Uncontrolled DM with microalbuminuria or microproteinuria     Past Surgical History:  Procedure Laterality Date  . none    . WISDOM TOOTH EXTRACTION      Review of systems negative except as noted in HPI / PMHx or noted below:  Review of Systems  Constitutional: Negative.   HENT: Negative.   Eyes: Negative.   Respiratory: Negative.   Cardiovascular: Negative.   Gastrointestinal: Negative.   Genitourinary: Negative.   Musculoskeletal: Negative.   Skin: Negative.   Neurological: Negative.   Endo/Heme/Allergies: Negative.   Psychiatric/Behavioral: Negative.      Objective:   Vitals:   07/14/18 1050  BP: 124/76  Pulse: 96  Resp: 18  SpO2: 97%          Physical Exam  HENT:  Head: Normocephalic.  Right Ear: Tympanic membrane, external ear and ear canal normal.  Left Ear: Tympanic membrane, external ear and ear canal normal.  Nose: Nose normal. No mucosal edema or rhinorrhea.  Mouth/Throat: Uvula is midline, oropharynx is clear and moist and mucous membranes are normal. No oropharyngeal exudate.  Eyes: Conjunctivae are normal.  Neck: Trachea normal. No tracheal tenderness present. No tracheal deviation present. No thyromegaly present.  Cardiovascular: Normal rate, regular rhythm, S1 normal, S2 normal and normal heart sounds.  No murmur heard. Pulmonary/Chest: Breath sounds normal. No stridor. No respiratory distress. He has no wheezes. He has no rales.  Musculoskeletal: He exhibits no edema.  Lymphadenopathy:       Head (right side): No tonsillar adenopathy present.       Head (left side): No tonsillar adenopathy present.    He has no cervical adenopathy.  Neurological: He is alert.  Skin: No rash noted. He is not  diaphoretic. No erythema. Nails show no clubbing.    Diagnostics:   The patient had an Asthma Control Test with the following results: ACT Total Score: 16.    Assessment and Plan:   1. Delayed gastric emptying   2. Asthma, severe persistent, well-controlled   3. Perennial allergic rhinitis   4. Food allergy   5. Gastroesophageal reflux disease, esophagitis presence not specified   6. Gastroparesis     1. Continue to perform Allergen avoidance measures as best as possible  2. Continue immunotherapy and Auvi-Q  3. Continue to Treat and prevent inflammation:   A. Asmanex 200 -2 puffs 2 times a day with a spacer   B. montelukast 10 mg tablet once  a day  C. Budesonide 0.5 mg in 100 ml nasal saline once a day.     4. Treat reflux:   A. Ranitidine 150 - 300 mg in evening  B. Try OTC Omeprazole 20mg  - in morning  5. Continue nystatin oral solution 5 mL swish and swallow every day  6. If needed:   A. nasal saline / wash  B. OTC antihistamine  C. Auvi-Q 0.3  D. Proair HFA 2 inhalations every 4-6 hours  E. Ibuprofen  7. Obtain GI evaluation for reflux  8. Obtain gastric emptying scan for delayed gastric emptying  9. Return to clinic in 12 weeks or earlier if problem.    10. Obtain fall flu vaccine   [name] appears to be doing relatively well regarding his respiratory tract but his reflux is still a significant issue.  He has autonomic dysfunction and we will investigate the possibility of delayed gastric emptying with a gastric emptying scan and I will refer him onto GI for further evaluation of these issues.  It should be noted that he started Diamox around the same point in time in which his gut became a big problem and he may be suffering a side effect from the use of this medication.  He will discuss with his neurologist if there is an alternative that he can use for his increased intracranial pressure.  I will see him back in this clinic in 12 weeks or earlier if there is a  problem.  Laurette SchimkeEric Hester Forget, MD Allergy / Immunology [facility]

## 2018-07-15 ENCOUNTER — Other Ambulatory Visit (INDEPENDENT_AMBULATORY_CARE_PROVIDER_SITE_OTHER): Payer: Self-pay | Admitting: "Endocrinology

## 2018-07-15 ENCOUNTER — Encounter: Payer: Self-pay | Admitting: Allergy and Immunology

## 2018-07-15 DIAGNOSIS — E1065 Type 1 diabetes mellitus with hyperglycemia: Principal | ICD-10-CM

## 2018-07-15 DIAGNOSIS — IMO0001 Reserved for inherently not codable concepts without codable children: Secondary | ICD-10-CM

## 2018-07-16 ENCOUNTER — Ambulatory Visit (INDEPENDENT_AMBULATORY_CARE_PROVIDER_SITE_OTHER): Payer: 59

## 2018-07-16 DIAGNOSIS — J309 Allergic rhinitis, unspecified: Secondary | ICD-10-CM | POA: Diagnosis not present

## 2018-07-20 ENCOUNTER — Encounter: Payer: Self-pay | Admitting: Gastroenterology

## 2018-07-21 ENCOUNTER — Encounter (HOSPITAL_COMMUNITY)
Admission: RE | Admit: 2018-07-21 | Discharge: 2018-07-21 | Disposition: A | Discharge status: Home / Self Care | Payer: 59 | Source: Ambulatory Visit | Attending: Allergy and Immunology | Admitting: Allergy and Immunology

## 2018-07-21 DIAGNOSIS — K3184 Gastroparesis: Secondary | ICD-10-CM

## 2018-07-21 MED ORDER — TECHNETIUM TC 99M SULFUR COLLOID
2.1000 | Freq: Once | INTRAVENOUS | Status: AC | PRN
  Administered 2018-07-21: 2.1 via ORAL

## 2018-07-23 ENCOUNTER — Ambulatory Visit (INDEPENDENT_AMBULATORY_CARE_PROVIDER_SITE_OTHER): Payer: 59

## 2018-07-23 DIAGNOSIS — J309 Allergic rhinitis, unspecified: Secondary | ICD-10-CM

## 2018-07-27 ENCOUNTER — Encounter (INDEPENDENT_AMBULATORY_CARE_PROVIDER_SITE_OTHER): Payer: Self-pay | Admitting: "Endocrinology

## 2018-07-27 ENCOUNTER — Ambulatory Visit (INDEPENDENT_AMBULATORY_CARE_PROVIDER_SITE_OTHER): Payer: 59 | Admitting: "Endocrinology

## 2018-07-27 VITALS — BP 118/68 | HR 90 | Ht 70.87 in | Wt 260.4 lb

## 2018-07-27 DIAGNOSIS — E1065 Type 1 diabetes mellitus with hyperglycemia: Secondary | ICD-10-CM

## 2018-07-27 DIAGNOSIS — E10649 Type 1 diabetes mellitus with hypoglycemia without coma: Secondary | ICD-10-CM

## 2018-07-27 DIAGNOSIS — I10 Essential (primary) hypertension: Secondary | ICD-10-CM

## 2018-07-27 DIAGNOSIS — E1043 Type 1 diabetes mellitus with diabetic autonomic (poly)neuropathy: Secondary | ICD-10-CM | POA: Diagnosis not present

## 2018-07-27 DIAGNOSIS — Z23 Encounter for immunization: Secondary | ICD-10-CM

## 2018-07-27 DIAGNOSIS — R5383 Other fatigue: Secondary | ICD-10-CM

## 2018-07-27 DIAGNOSIS — IMO0001 Reserved for inherently not codable concepts without codable children: Secondary | ICD-10-CM

## 2018-07-27 DIAGNOSIS — E049 Nontoxic goiter, unspecified: Secondary | ICD-10-CM

## 2018-07-27 DIAGNOSIS — K3184 Gastroparesis: Secondary | ICD-10-CM

## 2018-07-27 DIAGNOSIS — E063 Autoimmune thyroiditis: Secondary | ICD-10-CM

## 2018-07-27 DIAGNOSIS — R000 Tachycardia, unspecified: Secondary | ICD-10-CM

## 2018-07-27 LAB — POCT GLUCOSE (DEVICE FOR HOME USE): POC GLUCOSE: 193 mg/dL — AB (ref 70–99)

## 2018-07-27 MED ORDER — GLUCAGON (RDNA) 1 MG IJ KIT: PACK | INTRAMUSCULAR | 3 refills | Status: DC

## 2018-07-27 NOTE — Patient Instructions (Signed)
Follow up visit in 2 months. Please call Dr. Fransico Michael in two weeks on a Wednesday or Sunday evening to discuss BGs.

## 2018-07-27 NOTE — Progress Notes (Signed)
Subjective:  Patient Name: [name] Date of Birth: 1981/08/16  MRN: [id number]  [name]  presents to the office today for follow-up of his type 1 diabetes mellitus, goiter, obesity, combined hyperlipidemia, adult ADHD, fatigue, autonomic neuropathy, tachycardia, hypertension, microalbuminuria, hypoglycemia, sinusitis, bronchitis, allergies, proliferative retinopathy, and recent increased intracranial hypertension.  HISTORY OF PRESENT ILLNESS:   [name] is a 36 y.o. Caucasian gentleman. [name] was unaccompanied.  1. The patient was first referred to me on 01/08/2006 by his family physician, Dr. Roanna Epley, for evaluation and management of type 1 diabetes mellitus and related problems. The patient was 37 years old.  A. [name] had been diagnosed with type 1 diabetes somewhere in 2001-2002, at the age of 54-19. He was initially diagnosed with type 2 diabetes mellitus, but was later re-classified as type 1 diabetes mellitus. He had been followed by Dr. Arther Dames, staff endocrinologist at [facility]. [name] was started on an insulin pump approximately 18 months prior to first seeing me. His pump was a Medtronic Paradigm 712. His blood glucose control was fair-to-poor. He was frequently not checking blood sugars or taking insulin boluses as often as he needed to. He frequently noted fast heart rate. The patient's past medical history was positive for hypertension, for which he was taking lisinopril, and for what his mother called "extreme ADHD". He had previously been taking ADHD medicines but had discontinued them due to adverse effects. He had prior ankle injuries and knee injuries, to include tears of the lateral menisci bilaterally. He had not had any surgeries. He was working for a [employer] fast [employer] that had many different job sites in different parts of the [place] and in other countries as well. 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 He did not  use tobacco or drugs, but did drink alcohol occasionally.  B. On physical examination, his weight was 234 pounds, his height was 70 inches, his BMI was 33.2. Blood pressure was 128/70. Hemoglobin A1c was 9.5%. Heart rate was 96. He had normal affect and fair insight. He had a 25-30 gram goiter. He had normal 1+ DP pulses in his feet. He had normal sensation in his feet to touch, vibration, and monofilament. His CMP was normal except for glucose of 251. His cholesterol was 257, triglycerides 143, HDL 49, and LDL 179. TSH was 1.017, free T4 was 1.06, and free T3 was 3.3. His urinary microalbumin: creatinine ratio was 20.1 (normal less than 30).   C. The patient clearly needed better blood glucose control, which would only occur if he had better adherence to his plan. He appeared to have autonomic neuropathy, manifested by inappropriate sinus tachycardia.  His thyroid goiter suggested that he might have evolving Hashimoto's disease. He stated that he had lost some weight recently. I encouraged him to check his blood sugars more frequently and to take both correction boluses and food boluses.  2. 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 His hemoglobin A1c values have varied from 8.3-11.2%. Throughout the 12 year period, the patient's autonomic neuropathy and tachycardia have remained essentially the same. On 12/03/10 his total cholesterol was 270, triglycerides 94, HDL 50, and LDL 201. I started him on Crestor, 10 mg per day. He has since begun treatment with atorvastatin and his cholesterol  values had improved. In March 2018 he was converted to a Medtronic 670G pump and Guardian 3 CGM sensor. Since then his BGs have been much better. Unfortunately, beginning in about June 2018 he has also had a series of episodes of  bronchitis and sinusitis, muscle pains, and lethargy that presumably were due in large part to severe allergies. He has been very sick for much of the past year.  His atorvastatin and several other medications were stopped at that time. He has also developed increased intracranial pressure, strep throat, and thrush.   3. The patient's last [facility] visit was on 05/31/18. At that visit we continued his current pump settings.   A. He has had more problems with delayed gastric emptying and hypoglycemia. He stooped the metamucil and began Prilosec. His symptoms then essentially resolved. His gastric emptying test at that latter time was normal. He will see Dr. Esaw Grandchild at [facility] soon.  B. His allergies have been better controlled with weekly allergy injections. Dr. Lucie Leather started him on a biologic agent for asthma, but [name] had a severe reaction, so the agent was discontinued.    C. He saw Dr. Luciana Axe again on 05/17/18. He will have follow up in November. He did not see any changes associated with Diamox. Dr. Luciana Axe was pleased with the results of laser therapy for his neovascularization and possible macular edema.   D. He saw Dr. Porfirio Mylar Dohmeier in neurology again on 06/10/18. She continued the Diamox doses of 1000 mg in the morning and 500 mg at night. His HAs now only occur occasionally and are not as bad.    E. He continues to have problems with lactose intolerance. When he avoids lactose, however, he is asymptomatic. Some foods such as shellfish cause his throat to swell up.   F. He is using Humalog aspart insulin in his insulin pump. He takes losartan, 50 mg/day. He rarely has any coughing unless his allergies act up. [name]. His second sleep study was not bad enough to start on C-pap.    H. His BGs have varied with his illnesses and with the amount of postprandial bloating and constipation that he has. He sometimes has to delay his boluses if he is worried that his gastric emptying will be too delayed  and cause hypoglycemia.     4. Pertinent Review of Systems: Constitutional: [name] feels "better". He is still tired a lot. He is still somewhat sore and achy, but not as bad.  Eyes: As above.  Neck: He has not had had any further anterior neck swelling and tenderness.  Heart: The patient has no recent complaints of palpitations, irregular heat beats, chest pain, or chest pressure.  Gastrointestinal: As above. He has nausea only occasionally now.  The patient has no other complaints of excessive hunger, heartburn, acid reflux, upset stomach, stomach aches or pains, or diarrhea. Legs: He no longer has the sensation of vibration in his legs. Muscle mass and strength seem normal. There are no other complaints of numbness, tingling, burning, or pain. No edema is noted. Feet: There are no obvious foot problems now. There are no complaints of numbness, burning, or pain.  No edema is noted. Hypoglycemia: He has been having more BGS in the 90s-100s.   Emotional: His emotional state is "okay". He had his follow up appointment with Dr. Piedad Climes in [place] in November 2018. Dr. Shane Crutch will wait until [name]'s overall health situation stabilizes before trying any new psych  medications.     5. BG printout: We have data from the past 4 weeks. [name] changes sites every 1-4 days. He checks BGs 2-6 times daily, usually 3 times. Average BG is 195, compared with 166 at his last visit and with 157 at his prior visit. BGs varied from 101-372, compared with 96-270 at his last visit and with 71-289 at his prior visit. His higher BGs occurred after lunch. His BGs tend to be lower at 6-8 AM.  6. CGM printout: Skin glucose values and BG values correlate very well. Average SG was 154, compared with 151 at his last visit and with 144 at his prior visit  He was in auto mode 97% of the time. He was in zone 76% of the time. His highest SGs again occurred around  12-3 PM. He still sometimes consumes more carbs than he  realizes.     PAST MEDICAL, FAMILY, AND SOCIAL HISTORY:  Past Medical History:  Diagnosis Date  . ADHD (attention deficit hyperactivity disorder)   . Autonomic neuropathy due to diabetes (HCC)   . Diabetes (HCC)   . Fatigue   . Goiter   . High blood pressure   . High cholesterol   . Hypercholesterolemia   . Hypertension   . Hypoglycemia associated with diabetes (HCC)   . Obesity   . Tachycardia   . Type 1 diabetes mellitus not at goal [facility])   . Uncontrolled DM with microalbuminuria or microproteinuria     Family History  Problem Relation Age of Onset  . Dementia Paternal Aunt   . Schizophrenia Paternal Aunt   . Cancer Maternal Grandmother   . Diabetes Maternal Grandmother        T2 DM  . Cancer Maternal Grandfather   . Thyroid disease Neg Hx      Current Outpatient Medications:  .  acetaZOLAMIDE (DIAMOX SEQUELS) 500 MG capsule, Take 1000 mg in am and 500 mg in evening, Disp: 90 capsule, Rfl: 5 .  HUMALOG 100 UNIT/ML injection, INJECT 300 UNITS UNDER THE SKIN VIA INSULIN PUMP EVERY 48-72 HOURS PER HYPERGLYCEMIA AND DKA PROTOCOLS, Disp: 40 mL, Rfl: 5 .  losartan (COZAAR) 50 MG tablet, TAKE 1 TABLET(50 MG) BY MOUTH DAILY, Disp: 30 tablet, Rfl: 5 .  montelukast (SINGULAIR) 10 MG tablet, TAKE 1 TABLET(10 MG) BY MOUTH AT BEDTIME, Disp: 30 tablet, Rfl: 4 .  nystatin (MYCOSTATIN) 100000 UNIT/ML suspension, Swish and swallow 5 mls daily, Disp: 150 mL, Rfl: 2 .  ONE TOUCH ULTRA TEST test strip, USE AS DIRECTED SIX TIMES DAILY, Disp: 200 each, Rfl: 5 .  ranitidine (ZANTAC) 300 MG capsule, Take 1 capsule (300 mg total) by mouth every evening., Disp: 30 capsule, Rfl: 5 .  acetaminophen (TYLENOL) 500 MG tablet, Take 1,000 mg by mouth 2 (two) times daily as needed., Disp: , Rfl:  .  albuterol (PROAIR HFA) 108 (90 Base) MCG/ACT inhaler, Inhale 2 puffs into the lungs every 6 (six) hours as needed for wheezing or shortness of breath. (Patient not taking: Reported on 07/27/2018), Disp: 1  Inhaler, Rfl: 2 .  AUVI-Q 0.3 MG/0.3ML SOAJ injection, Use as directed for life-threatening allergic reaction. (Patient not taking: Reported on 07/27/2018), Disp: 4 Device, Rfl: 2 .  budesonide (PULMICORT) 0.5 MG/2ML nebulizer solution, Take 2 mLs (0.5 mg total) by nebulization as directed. (Patient not taking: Reported on 07/27/2018), Disp: 2 mL, Rfl: 3 .  clonazePAM (KLONOPIN) 0.5 MG tablet, Take 0.5 mg by mouth 2 (two) times daily as needed., Disp: ,  Rfl: 0 .  ibuprofen (ADVIL,MOTRIN) 600 MG tablet, Take 600 mg by mouth 2 (two) times daily as needed., Disp: , Rfl:  .  Mometasone Furoate (ASMANEX HFA) 200 MCG/ACT AERO, Inhale 2 puffs into the lungs 2 (two) times daily. (Patient not taking: Reported on 07/27/2018), Disp: 13 g, Rfl: 5 .  ONE TOUCH ULTRA TEST test strip, CHECK BLOOD SUGAR 6 TIMES DAILY, Disp: 600 each, Rfl: 1 .  ranitidine (ZANTAC) 150 MG tablet, , Disp: , Rfl: 5 No current facility-administered medications for this visit.   Facility-Administered Medications Ordered in Other Visits:  .  gadopentetate dimeglumine (MAGNEVIST) injection 20 mL, 20 mL, Intravenous, Once PRN, Dohmeier, Porfirio Mylar, MD  Allergies as of 07/27/2018 - Review Complete 07/27/2018  Allergen Reaction Noted  . Flonase [fluticasone] Shortness Of Breath 06/10/2017  . Sulfites Itching and Swelling 02/04/2018  . Molds & smuts  03/09/2018  . Tetracyclines & related Other (See Comments) 03/09/2018    1. Work and Family: He is now working part-time at home. 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 He hopes to return to full-time work soon. 2. Activities: He has not done much physical activity this calendar year. He was more physically active when he felt good back in November 2018.    3. Smoking, alcohol, or drugs: Occasional beer, about one-two per month. No tobacco or drugs. 4. Primary Care Provider: Dr. Guerry Bruin, MD, [facility] 5. Neurology: Dr. Vickey Huger 6. Ophthalmology:  Dr. Luciana Axe 7. Urology: Dr. Annabell Howells at [facility] 8. Sports medicine: Dr. Darrick Penna  REVIEW OF SYSTEMS: There are no other significant problems involving Saleem's other body systems.   Objective:  Vital Signs:  BP 118/68   Pulse 90   Ht 5' 10.87" (1.8 m)   Wt 260 lb 6.4 oz (118.1 kg)   BMI 36.46 kg/m    Ht Readings from Last 3 Encounters:  07/27/18 5' 10.87" (1.8 m)  07/06/18 6' (1.829 m)  06/09/18 6' (1.829 m)   Wt Readings from Last 3 Encounters:  07/27/18 260 lb 6.4 oz (118.1 kg)  07/06/18 240 lb (108.9 kg)  06/09/18 256 lb (116.1 kg)   PHYSICAL EXAM:  Constitutional: The patient appears healthier, but still somewhat tired. His weight has increased 4 pounds. His affect is normal. His insight is normal.  Face: The face appears normal.  Eyes: He does not have much allergic conjunctivitis. There is no obvious arcus or proptosis. Moisture appears normal.  Mouth: The oropharynx and tongue appear normal. Oral moisture is normal. Neck: The neck appears to be visibly normal. No carotid bruits are noted. He has a short neck. His strap muscles are smaller. The thyroid gland is low lying and is again mildly enlarged at about 21 grams in size. Today only the left lobe is enlarged. The thyroid gland is not tender to palpation today.  Lungs: The lungs are clear to auscultation. Air movement is good.  Heart: Heart rate and rhythm are regular. Heart sounds S1 and S2 are normal. I did not appreciate any pathologic cardiac murmurs. Abdomen: The abdomen is enlarged. Bowel sounds are normal. There is no obvious hepatomegaly, splenomegaly, or other mass effect. The abdomen is somewhat tender to deep palpation laterally in both sides. Arms: Muscle size and bulk are normal for age. Hands: There is no tremor of his hands today. Phalangeal and metacarpophalangeal joints are normal. Palmar muscles are normal. Palmar skin is normal. Palmar moisture is also normal. Legs: Muscles appear normal  for age.  No edema is present. Feet: 1+ right DP pulse, 1+ left DP pulse. Neurologic: Strength is normal for age in both the upper and lower extremities. Muscle tone is normal. Sensation to touch is normal in both legs and both feet.   LAB DATA:   Labs 07/27/18: CBG 193  Labs 05/31/18: HbA1c 6.6%, CBG 202  Labs 03/18/18: CBG 178  Labs 01/13/18: HbA1c 6.8%, CBG 194; TSH 0.98, free T4 1.3; free T3 3.6; CMP normal except glucose 191; urinary microalbumin/creatinine ratio 35 (ref <30)  Labs 11/17/17: CBG 170  Labs 10/13/17: HbA1c 6.8%, CBG 191  Labs 08/24/17: CBG 117  Labs 07/20/17: CBG 216  Labs 07/08/17: IgA, IgG, and IgM normal. CBC normal.  Labs 06/16/17: HbA1c 6.5%, CBG 193  Labs 05/15/17: CBG 209; TSH 0.90, free T4 1.2, free T3 3.8  Labs 03/05/17: HbA1c 7.3%, CBG 241  Labs 02/20/17: CBG 180 fasting  Labs 01/29/17: CBG 148  Labs 01/12/17: CBG 116  Labs 12/29/16: HbA1c 9.1%, CBG 223; TSH 0.62, free T4 1.1, free T3 3.5; urine microalbumin/creatinine ratio 22; cholesterol 171, triglycerides 93, HDL 40, LDL 112;  CMP glucose 201  Labs 09/26/16: HbA1c 9.5%  Labs 08/13/16: Celiac panel negative; CMP normal except for glucose 207; CBC normal  Labs 06/13/16: HbA1c 9.8%; LH 3.6, FSH 2.3, testosterone 533 (ref 250-827), free testosterone 59.4 (ref 47-244)  Labs 02/11/16: HbA1c 9.2%  Labs 01/07/16: HbA1c 10.5%; CBC with Hgb elevated at 17.2%; LH 3.9, FSH 2.0; TSH 1.53, free T4 1.3, free T3 3.7; cholesterol 262, triglycerides 171, HDL 40, LDL 188; urinary microalbumin/creatinine ratio 31; CMP normal except for glucose 181  Labs 06/29/15. HbA1c 9.6%  Labs 12/28/14: Hemoglobin A1c 9.5% today, compared with 9.5%, at last visit and with 9.9% at the prior visit.  Labs 08/03/14: HbA1c 9.5%; TSH 0.548,free T4 1.10, free T3 3.7, TPO antibody < 1, TSI 30; CMP normal except glucose 191; urinary microalbumin/creatinine ratio 27; cholesterol 260, triglycerides 90, HDL 51, LDL 191   Labs 10/05/13: HbA1c  9.9%  Labs 04/23/13: CMP normal, except glucose 144; cholesterol 216, triglycerides 97, HDL 39, LDL 158; urinary microalbumin/creatinine ratio 6.9; TSH 0.505, free T4 1.17, free T3 3.8  Labs: 01/21/12: TSH 1.251, free T4 1.10, free T3 3.6, CBC normal, CMP normal except for glucose 206, urinary microalbumin/creatinine ratio 6.3, cholesterol 187, triglycerides 67, HDL 45, LDL 129   Assessment and Plan:   ASSESSMENT:  1. T1DM:   A. His BGs are doing quite well overall, but still vary with whether or not he has active allergic rhinitis/sinusitis/bronchitis or other illnesses, whether or not he feels well enough to exercise, and how long he keeps his sites in place. Almost all of his higher BGs in the past month were due to keeping his sites in too long or to delaying boluses in fear of hypoglycemia. The combination of the Medtronic 670G pump and Guardian 3 sensor is again helping him achieve good BG control.   B. Dr. Vickey Huger was worried that Diamox might cause erroneous BG or SG values. Thus far his SGs and BGs have correlated well.  2. Hypoglycemia: He has had no BG <80.  3. Hypertension: SBP and DBP are good today. He needs to continue to take meds regularly and to exercise regularly when he feels well enough to do so.  4. Combined hyperlipidemia: His lipids in March 2017 were too high, but the lipids in March 2018 were much better on atorvastatin. He discontinued the atorvastatin in mid-2018 due  to concern that this medication might be causing his neuromuscular symptoms. We will plan to re-start the atorvastatin in the near future once he becomes stable on immunotherapy. We will repeat his lipid panel at that time.  5. Autonomic neuropathy with tachycardia and gastroparesis: His gastroparesis persists. His heart rate has normalized. His BMs have also normalized. 6. Obesity: Weight has increased a bit. Eating Right and exercise will help. He needs fewer calories and more exercise.      7-8.  Goiter/thyroiditis:   A. His thyroid gland is still somewhat enlarged. His thyroiditis is clinically quiescent. The active thyroiditis that he has had in the past and the  process of waxing and waning of thyroid gland size are c/w evolving Hashimoto's thyroiditis.   B. He was euthyroid in March 2013, but was borderline hyperthyroid in June 2014. He was euthyroid in October 2015, March 2017, March 2018, July 2018, and March 2019.  9. Fatigue: This problem has improved somewhat.   10. Obstructive sleep apnea: He did not meet the criteria for OSA, but because of his snoring a dental appliance was suggested. I recommended that he obtain a dental appliance. Unfortunately, his former dentist no longer accepts [employer], so [name] has to find a new dentist. 11. Adult ADD: He had an appointment with Dr. Piedad Climes, MD in [place] in November 2018. Dr. Shane Crutch is waiting for [name]'s medical situation to stabilize before beginning any new psych meds.   12. Proliferative diabetic retinopathy: This problem is due to his long period of poorly controlled BGs. The retinopathy was worse earlier in July 2019. It will take at least 6-12 months of near-normal BGs for his retinopathy to stabilize.  He will have follow up exam soon.  13. URI/nasal congestion/vertigo/allergic rhinitis and conjunctivitis: He has much less allergic rhinitis and conjunctivitis since starting immunotherapy.    14. Neuromuscular symptoms and headaches/increased intracranial pressure:   A. It appeared previously that some of [name]'s headaches were classic tension headaches. When he performed cervical stretching exercises the headache resolved.   B. In the past when Dr. Vickey Huger evaluated [name] for his vague neuromuscular symptoms, she did not find any signs of any serious neurologic disease. His MRI of his brain was unremarkable. At that time his neuromuscular symptoms had resolved.  C. In the past few months, however, the headaches  worsened and some of the neuromuscular symptoms recurred. We now know that he had increased ICP and that his headaches improved for a brief time after the LP. He then developed a typical spinal tap headache.  D. In retrospect he may have had intermittent problems with elevated ICP previously.  E. Dr. Vickey Huger suspects that [name]'s ICP was due to the doxycycline. She will follow this issue over time.  15. Calcified lymph node: It is possible that he may have contracted a fungus while he was working in the [employer] several years ago. His thyroid US showed his goiter, but no intrinsic thyroid abnormality. A partially calcified cervical lymph node on the right was noted, c/w a post infections/inflammatory node. 16. Microalbuminuria: His ratio was mildly elevated in March 2019. He needs tighter BG and BP control.  PLAN:  1. Diagnostic:  Repeat CBG  today. Call in two weeks on a Sunday or Wednesday evening to discuss BGs.  2. Therapeutic: Follow DM care plan. Continue losartan. Continue his current pump basal rates, ISFs, and BG targets.   Continue current ICRs and other pump settings:  MN: 7 8  AM: 5.00 11 AM: 4.5 9 PM: 7 3. Patient education:   A. We discussed the significant improvements in his allergy-related symptoms during the past several months.  We also discussed his increased ICP symptoms and their improvement with Diamox. We discussed the need to make small changes in his ICRs based upon his eating habits and exercise habits. We also discussed the need for him to continue to be followed closely by Drs Wylene Simmer, Kozlow, Dohmeier, [place], [place], [place], and [place].   B.  We discussed the fact that he has a relative who is an ENT physician in [place]. That relative advised him to go to the [facility] in [place], [place] to obtain a multidisciplinary evaluation of all of his problems to see if there might be one unifying cause that we have missed. I told him that it is possible that all of  [place] could have missed a unifying cause, but that does not seem to be the case. However, going to [facility] is certainly something that might prove valuable to him. He will check with his medical insurance company to see if his insurance will cover such an evaluation.  4. Follow-up:  2 months  Level of Service: This visit lasted in excess of 65 minutes. More than 50% of the visit was devoted to counseling.  Molli Knock, MD, CDE Adult and Pediatric Endocrinology  07/27/2018 10:32 AM

## 2018-07-29 IMAGING — US US PELVIS LIMITED
1 series · 14 of 15 positions shown · non-contrast
Comparison: None.

CLINICAL DATA: Dysuria

EXAM:
LIMITED ULTRASOUND OF PELVIS
TECHNIQUE: Limited transabdominal ultrasound examination of the pelvis was
performed.

[Series 1: us pelvis limited · 0.23mm/px · 14 of 15 slices shown]
[im 1/15]
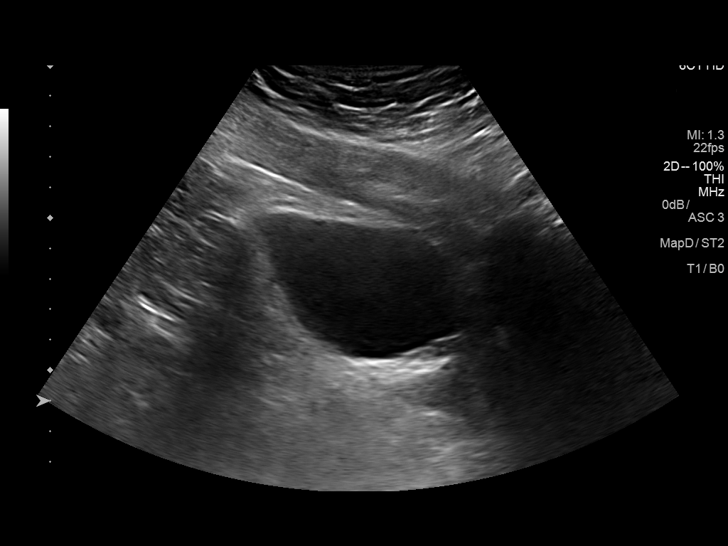
[im 2/15]
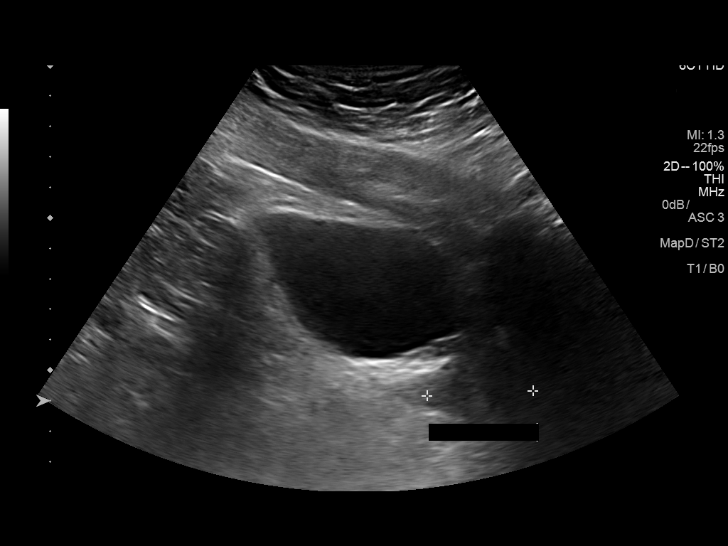
[im 3/15]
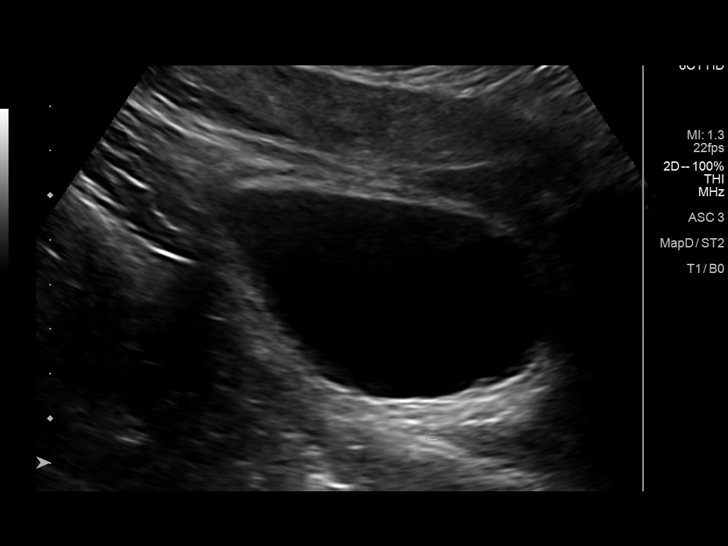
[im 4/15]
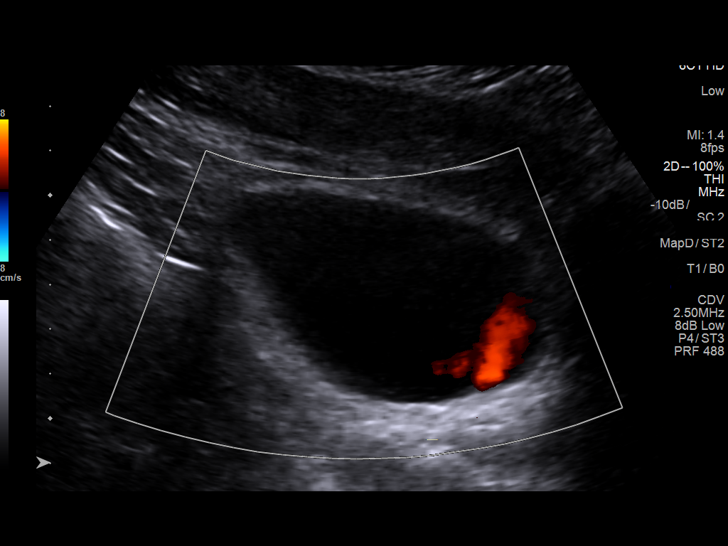
[im 5/15]
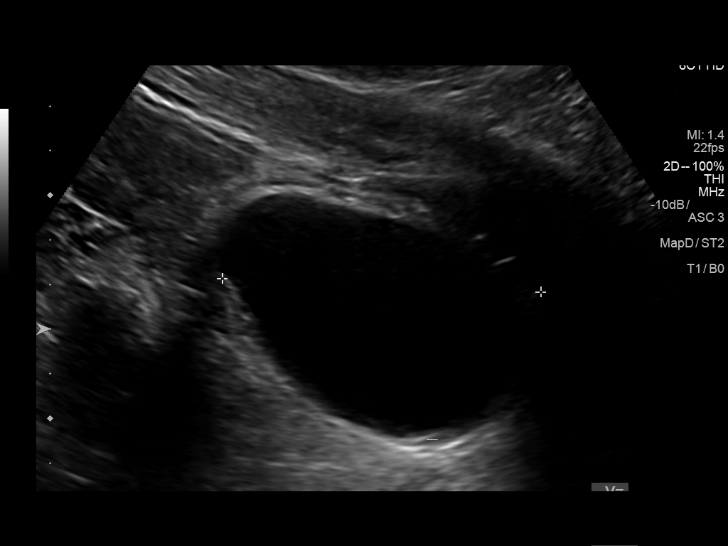
[im 6/15]
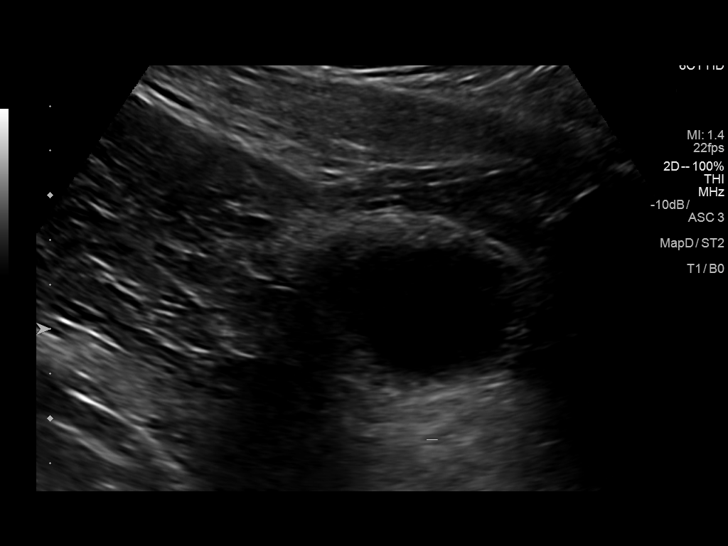
[im 7/15]
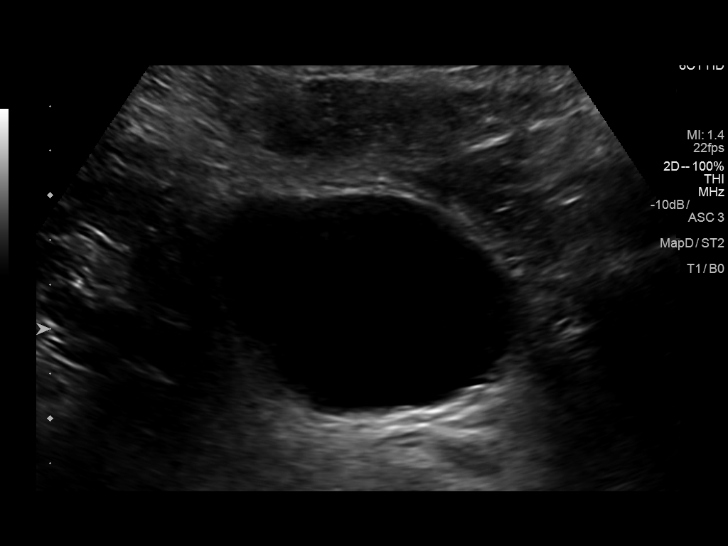
[im 9/15]
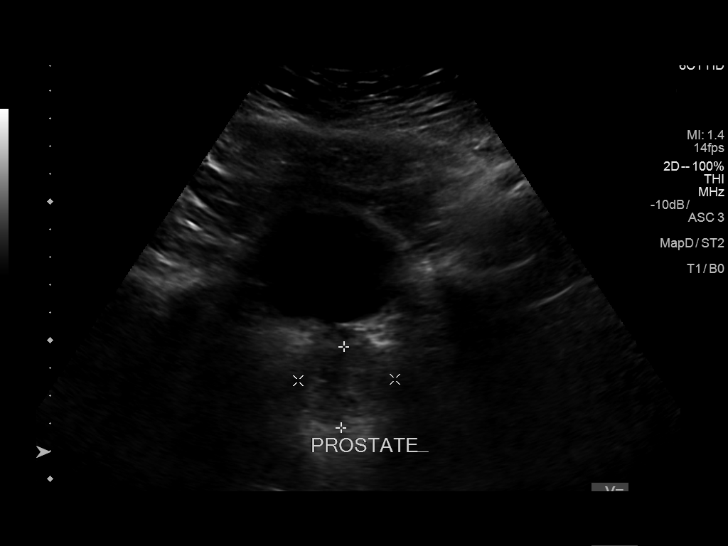
[im 10/15]
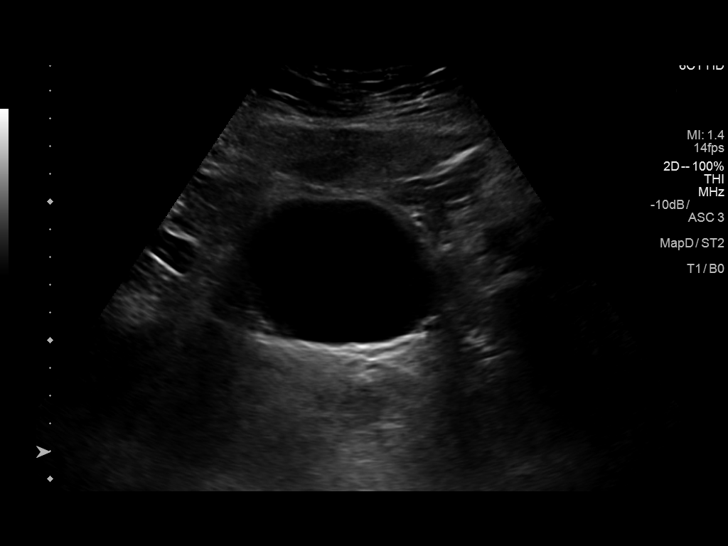
[im 11/15]
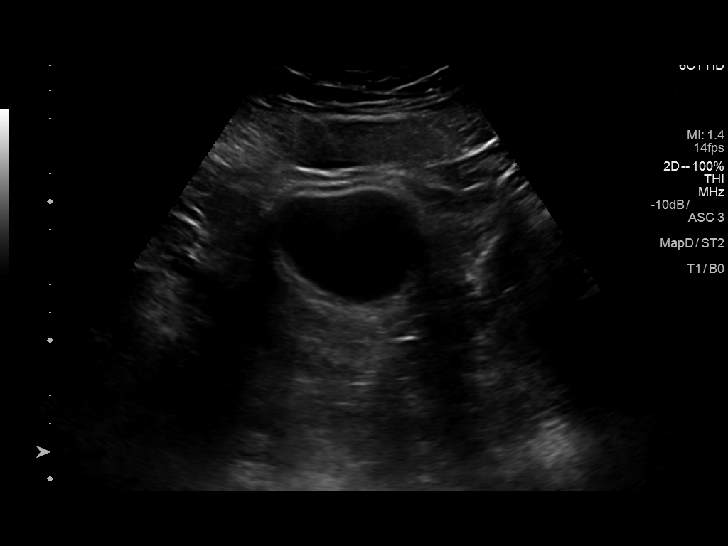
[im 12/15]
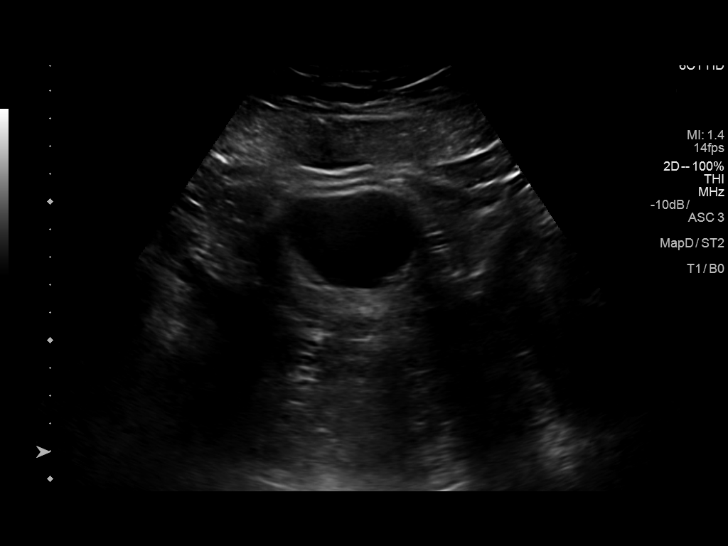
[im 13/15]
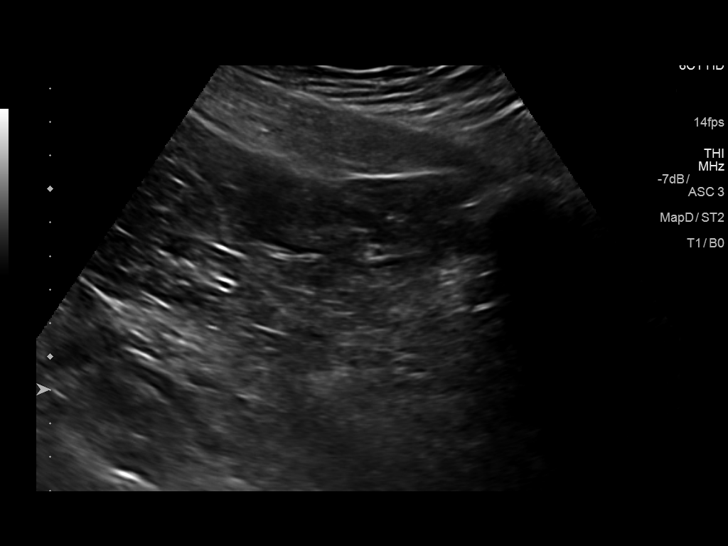
[im 14/15]
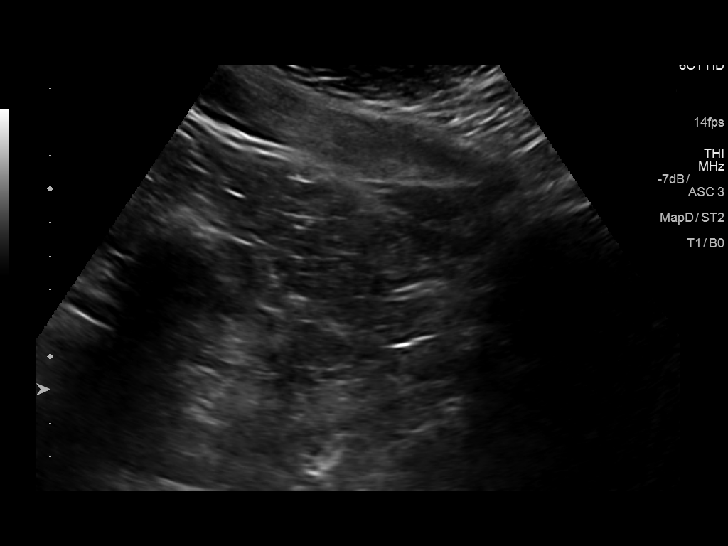
[im 15/15]
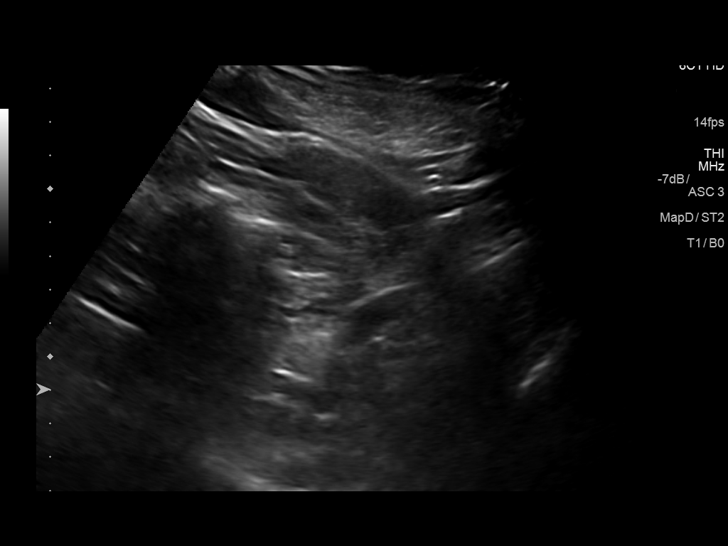

[14 of 15 positions shown; findings below may reference images not displayed]

FINDINGS: Normal bladder. No bladder wall thickening. Prevoid bladder volume
123 mL. Post void bladder volume 0 mL. Prostate gland measures 3.5 x
2.9 x 3.5 cm.
IMPRESSION: Normal bladder ultrasound.

## 2018-08-03 ENCOUNTER — Ambulatory Visit (INDEPENDENT_AMBULATORY_CARE_PROVIDER_SITE_OTHER): Payer: 59 | Admitting: *Deleted

## 2018-08-03 DIAGNOSIS — J309 Allergic rhinitis, unspecified: Secondary | ICD-10-CM

## 2018-08-04 ENCOUNTER — Other Ambulatory Visit (INDEPENDENT_AMBULATORY_CARE_PROVIDER_SITE_OTHER): Payer: 59 | Admitting: *Deleted

## 2018-08-04 ENCOUNTER — Encounter (INDEPENDENT_AMBULATORY_CARE_PROVIDER_SITE_OTHER): Payer: Self-pay

## 2018-08-10 ENCOUNTER — Ambulatory Visit: Payer: 59 | Admitting: Gastroenterology

## 2018-08-10 ENCOUNTER — Encounter: Payer: Self-pay | Admitting: Gastroenterology

## 2018-08-10 VITALS — BP 120/88 | HR 72 | Ht 71.0 in | Wt 258.6 lb

## 2018-08-10 DIAGNOSIS — R12 Heartburn: Secondary | ICD-10-CM

## 2018-08-10 DIAGNOSIS — R140 Abdominal distension (gaseous): Secondary | ICD-10-CM | POA: Diagnosis not present

## 2018-08-10 DIAGNOSIS — K5909 Other constipation: Secondary | ICD-10-CM | POA: Diagnosis not present

## 2018-08-10 DIAGNOSIS — R1030 Lower abdominal pain, unspecified: Secondary | ICD-10-CM

## 2018-08-10 NOTE — Patient Instructions (Addendum)
If you are age 36 or older, your body mass index should be between 23-30. Your Body mass index is 36.07 kg/m. If this is out of the aforementioned range listed, please consider follow up with your Primary Care Provider.  If you are age 87 or younger, your body mass index should be between 19-25. Your Body mass index is 36.07 kg/m. If this is out of the aformentioned range listed, please consider follow up with your Primary Care Provider.   Start Miralax 1/2 cap a day, increase to 1 capful a day if needed.   Please follow up in 6-8 weeks. 09-22-2018  It was a pleasure to see you today!  Dr. Myrtie Neither

## 2018-08-10 NOTE — Progress Notes (Signed)
[facility] Consult Note:  History: [name] 08/10/2018  Referring physician: Hoyle Barr, [name]  Reason for consult/chief complaint: Gastroesophageal Reflux (Heartburn, coughing up foam in the morning) and Constipation (Slow moving bowels, cramping)   Subjective  HPI:  This is a very pleasant 37 year old man referred by his allergist for concerns of reflux as well as abdominal pain and constipation.  I reviewed his most recent primary care office note dated 07/12/2018 by Joseph Shell, PA at [facility].  He was establishing care with them that day.  [name] was diagnosed with type 1 diabetes at age 71 and has an insulin pump.  Recent hemoglobin A1c reportedly 6.5 (he says similar for about 2 years, prior to that >9 before on insulin pump) I reviewed last endocrinology note ([name]) from 07/14/18.  Among other things, this discussed concerns of reflux with the patient's complaints of intermittent heartburn, upper abdominal bloating and regurgitation.  [name] describes about 2 years worth of recurrent respiratory infections requiring many antibiotic courses.  He then started getting regular allergy work-up and follow-up.  Over that same time, he has had gradual change in bowel habits where they have decreased in frequency.  He will have a BM about every 2 or 3 days, during which time will be bloating and some crampy abdominal pain.  He will then "open up" and he will have multiple bowel movements over the course of a day, and then become constipated again.  He took Metamucil for some time, which proved his bowel function, but he and his allergist were concerned that it may have caused delayed gastric emptying because he felt bloated.  He was also concerned about delayed gastric emptying because he would have periods of persistent hypoglycemia that would not come up even when he ate and drank. Because of concerns of reflux, and with description of having  frequent mucus and "foam" in his throat in the morning, he was put on ranitidine.  This was then changed to omeprazole with a little improvement in that symptom. He has occasional heartburn, denies dysphagia, nausea or vomiting or weight loss. Denies rectal bleeding.  ROS:  Review of Systems  Constitutional: Negative for appetite change and unexpected weight change.  HENT: Negative for mouth sores and voice change.   Eyes: Negative for pain and redness.  Respiratory: Negative for cough and shortness of breath.   Cardiovascular: Negative for chest pain and palpitations.  Genitourinary: Negative for dysuria and hematuria.  Musculoskeletal: Negative for arthralgias and myalgias.  Skin: Negative for pallor and rash.  Neurological: Negative for weakness and headaches.  Hematological: Negative for adenopathy.     Past Medical History: Past Medical History:  Diagnosis Date  . ADHD (attention deficit hyperactivity disorder)   . Autonomic neuropathy due to diabetes (HCC)   . Diabetes (HCC)   . Fatigue   . Goiter   . High blood pressure   . High cholesterol   . Hypercholesterolemia   . Hypertension   . Hypoglycemia associated with diabetes (HCC)   . Obesity   . Tachycardia   . Type 1 diabetes mellitus not at goal [facility])   . Uncontrolled DM with microalbuminuria or microproteinuria      Past Surgical History: Past Surgical History:  Procedure Laterality Date  . none    . REFRACTIVE SURGERY     x9  . WISDOM TOOTH EXTRACTION       Family History: Family History  Problem Relation Age of Onset  . Dementia  Paternal Aunt   . Schizophrenia Paternal Aunt   . Cancer Maternal Grandmother   . Diabetes Maternal Grandmother        T2 DM  . Cancer Maternal Grandfather   . Heart disease Father   . Thyroid disease Neg Hx     Social History: Social History   Socioeconomic History  . Marital status: Single    Spouse name: Not on file  . Number of children: Not on file  . Years  of education: Not on file  . Highest education level: Not on file  Occupational History  . Not on file  Social Needs  . Financial resource strain: Not on file  . Food insecurity:    Worry: Not on file    Inability: Not on file  . Transportation needs:    Medical: Not on file    Non-medical: Not on file  Tobacco Use  . Smoking status: Never Smoker  . Smokeless tobacco: Never Used  Substance and Sexual Activity  . Alcohol use: No  . Drug use: No  . Sexual activity: Not on file  Lifestyle  . Physical activity:    Days per week: Not on file    Minutes per session: Not on file  . Stress: Not on file  Relationships  . Social connections:    Talks on phone: Not on file    Gets together: Not on file    Attends religious service: Not on file    Active member of club or organization: Not on file    Attends meetings of clubs or organizations: Not on file    Relationship status: Not on file  Other Topics Concern  . Not on file  Social History Narrative   ** Merged History Encounter **        Allergies: Allergies  Allergen Reactions  . Flonase [Fluticasone] Shortness Of Breath  . Sulfites Itching and Swelling  . Molds & Smuts   . Tetracyclines & Related Other (See Comments)    increased intracranial pressure    Outpatient Meds: Current Outpatient Medications  Medication Sig Dispense Refill  . acetaminophen (TYLENOL) 500 MG tablet Take 1,000 mg by mouth 2 (two) times daily as needed.    [name] acetaZOLAMIDE (DIAMOX SEQUELS) 500 MG capsule Take 1000 mg in am and 500 mg in evening 90 capsule 5  . albuterol (PROAIR HFA) 108 (90 Base) MCG/ACT inhaler Inhale 2 puffs into the lungs every 6 (six) hours as needed for wheezing or shortness of breath. 1 Inhaler 2  . AUVI-Q 0.3 MG/0.3ML SOAJ injection Use as directed for life-threatening allergic reaction. 4 Device 2  . budesonide (PULMICORT) 0.5 MG/2ML nebulizer solution Take 2 mLs (0.5 mg total) by nebulization as directed. 2 mL 3  .  clonazePAM (KLONOPIN) 0.5 MG tablet Take 0.5 mg by mouth 2 (two) times daily as needed.  0  . glucagon 1 MG injection Follow package directions for low blood sugar. 1 each 3  . HUMALOG 100 UNIT/ML injection INJECT 300 UNITS UNDER THE SKIN VIA INSULIN PUMP EVERY 48-72 HOURS PER HYPERGLYCEMIA AND DKA PROTOCOLS 40 mL 5  . losartan (COZAAR) 50 MG tablet TAKE 1 TABLET(50 MG) BY MOUTH DAILY 30 tablet 5  . Mometasone Furoate (ASMANEX HFA) 200 MCG/ACT AERO Inhale 2 puffs into the lungs 2 (two) times daily. 13 g 5  . montelukast (SINGULAIR) 10 MG tablet TAKE 1 TABLET(10 MG) BY MOUTH AT BEDTIME 30 tablet 4  . nystatin (MYCOSTATIN) 100000 UNIT/ML suspension Swish and  swallow 5 mls daily 150 mL 2  . ONE TOUCH ULTRA TEST test strip USE AS DIRECTED SIX TIMES DAILY 200 each 5  . ONE TOUCH ULTRA TEST test strip CHECK BLOOD SUGAR 6 TIMES DAILY 600 each 1  . ranitidine (ZANTAC) 300 MG capsule Take 1 capsule (300 mg total) by mouth every evening. 30 capsule 5   No current facility-administered medications for this visit.    Facility-Administered Medications Ordered in Other Visits  Medication Dose Route Frequency Provider Last Rate Last Dose  . gadopentetate dimeglumine (MAGNEVIST) injection 20 mL  20 mL Intravenous Once PRN Dohmeier, Porfirio Mylar, [name]          ___________________________________________________________________ Objective   Exam:  BP 120/88   Pulse 72   Ht 5\' 11"  (1.803 m)   Wt 258 lb 9.6 oz (117.3 kg)   BMI 36.07 kg/m    General: this is a(n) overweight and well-appearing man with normal vocal quality  Eyes: sclera anicteric, no redness  ENT: oral mucosa moist without lesions, no cervical or supraclavicular lymphadenopathy, good dentition  CV: RRR without murmur, S1/S2, no JVD, no peripheral edema  Resp: clear to auscultation bilaterally, normal RR and effort noted  GI: soft, no tenderness, with active bowel sounds. No guarding or palpable organomegaly noted.  Skin; warm and dry,  no rash or jaundice noted  Neuro: awake, alert and oriented x 3. Normal gross motor function and fluent speech Rectal: normal external - normal DRE, heme neg soft stool  Labs:  CBC Latest Ref Rng & Units 06/02/2018 03/10/2018 07/08/2017  WBC 3.4 - 10.8 x10E3/uL 7.6 9.9 6.6  Hemoglobin 13.0 - 17.7 g/dL 16.1 09.6 04.5  Hematocrit 37.5 - 51.0 % 49.7 45.4 46.9  Platelets 150 - 450 x10E3/uL 261 262 232   CMP Latest Ref Rng & Units 03/25/2018 03/10/2018 01/13/2018  Glucose 65 - 99 mg/dL - 409(W) 119(J)  BUN 6 - 20 mg/dL - 10 12  Creatinine 4.78 - 1.24 mg/dL - 2.95 6.21  Sodium 308 - 144 mmol/L 137 136 138  Potassium 3.5 - 5.2 mmol/L 4.3 4.1 4.4  Chloride 96 - 106 mmol/L 105 103 102  CO2 20 - 29 mmol/L 18(L) 23 30  Calcium 8.9 - 10.3 mg/dL - 8.7(L) 9.5  Total Protein 6.5 - 8.1 g/dL - 7.3 6.8  Total Bilirubin 0.3 - 1.2 mg/dL - 1.0 0.5  Alkaline Phos 38 - 126 U/L - 71 -  AST 15 - 41 U/L - 19 17  ALT 17 - 63 U/L - 25 24     Radiologic Studies:  Normal GED 07/21/18   CTAP (during ED visit):  Normal except reportedly has a  relatively small pancreas  Assessment: Encounter Diagnoses  Name Primary?  . Heartburn Yes  . Abdominal bloating   . Lower abdominal pain   . Chronic constipation     He has occasional heartburn, but his morning production of mucus and "foam" does not sound reflux related.  He also does not have delayed gastric emptying based on his study.  I think the Metamucil caused bloating as its common side effect, but not true delayed gastric emptying.  He has autonomic neuropathy, and I believe his constipation is related to that.  That is much more likely than something obstructive, given his description of the symptoms and their time course.  Nothing felt on rectal exam and stool heme-negative without anemia.  Plan:  I advised him to stop ranitidine given some recent safety concerns over potential contaminant  ingredients. I do not have any objection him taking a PPI, but  I suspect it is only likely to help the heartburn and not the other symptoms.  Any reflux that he has also requires diet and lifestyle changes, which he is making an effort to do as best he can.  That is very challenging given his insulin requiring diabetes and limitations on his activity level due to chronic symptoms from his pseudotumor cerebri.  I recommended daily MiraLAX for chronic constipation, start with a half capful, increase as needed.  If that is either ineffective or limited by side effects, we can try something like Linzess or Amitiza. I am not currently planning to do a colonoscopy but this can be reconsidered depending upon how he does with this treatment plan.  I will see him back in about 6 weeks, call sooner as needed.  Thank you for the courtesy of this consult.  Please call me with any questions or concerns.  Charlie Pitter III  CC: Joseph Shell, PA [facility]) [name]

## 2018-08-12 ENCOUNTER — Ambulatory Visit (INDEPENDENT_AMBULATORY_CARE_PROVIDER_SITE_OTHER): Payer: 59 | Admitting: *Deleted

## 2018-08-12 DIAGNOSIS — J309 Allergic rhinitis, unspecified: Secondary | ICD-10-CM

## 2018-08-19 ENCOUNTER — Ambulatory Visit (INDEPENDENT_AMBULATORY_CARE_PROVIDER_SITE_OTHER): Payer: 59

## 2018-08-19 DIAGNOSIS — J309 Allergic rhinitis, unspecified: Secondary | ICD-10-CM | POA: Diagnosis not present

## 2018-08-31 ENCOUNTER — Ambulatory Visit (INDEPENDENT_AMBULATORY_CARE_PROVIDER_SITE_OTHER): Payer: 59 | Admitting: Allergy & Immunology

## 2018-08-31 ENCOUNTER — Encounter: Payer: Self-pay | Admitting: Allergy & Immunology

## 2018-08-31 VITALS — BP 102/76 | HR 92 | Resp 20

## 2018-08-31 DIAGNOSIS — J4551 Severe persistent asthma with (acute) exacerbation: Secondary | ICD-10-CM | POA: Insufficient documentation

## 2018-08-31 DIAGNOSIS — J302 Other seasonal allergic rhinitis: Secondary | ICD-10-CM | POA: Diagnosis not present

## 2018-08-31 DIAGNOSIS — K219 Gastro-esophageal reflux disease without esophagitis: Secondary | ICD-10-CM | POA: Diagnosis not present

## 2018-08-31 DIAGNOSIS — J3089 Other allergic rhinitis: Secondary | ICD-10-CM | POA: Diagnosis not present

## 2018-08-31 MED ORDER — ALBUTEROL SULFATE (2.5 MG/3ML) 0.083% IN NEBU: 2.5000 mg | INHALATION_SOLUTION | RESPIRATORY_TRACT | 1 refills | Status: DC | PRN

## 2018-08-31 NOTE — Progress Notes (Signed)
FOLLOW UP  Date of Service/Encounter:  08/31/18   Assessment:   Severe persistent asthma with acute exacerbation - with a history of recurrent prednisone courses and failed Dupixent  Seasonal and perennial allergic rhinitis  Gastroesophageal reflux disease  Complicated past medical history, including pseudotumor cerebri and type 1 diabetes mellitus  Plan/Recommendations:   1. Severe persistent asthma with acute exacerbation - Lung testing looked abnormal even after after a nebulizer treatment. - Add on albuterol (ProAir) 4 puffs every four hours OR an albuterol nebulizer treatment for the next few days to see if this helps your symptoms.  - Albuterol nebulizer and teaching provided.  - Increase Asmanex to four puffs twice daily for 1-2 weeks to provide more anti-inflammatory activity in the lungs. - Start the prednisone dose pack if you are not feeling better by the end of the week. - We will start the submission process for Nucala, which is an asthma medication that has the longest safety profile. - You should hear from [place] in the next few days about this submission. - Call us at the end of the week with an update.   2. Seasonal and perennial allergic rhinitis - Continue with allergy shots at the same schedule. - Continue with Pulmicort nasal rinses 2-3 times weekly. - Continue with Singulair daily. - Continue with your antihistamine daily.  3. Return in about 3 months (around 12/01/2018).  Subjective:   [name] is a 37 y.o. male presenting today for follow up of  Chief Complaint  Patient presents with  . Breathing Problem  . Shortness of Breath    [name] has a history of the following: Patient Active Problem List   Diagnosis Date Noted  . Severe persistent asthma with acute exacerbation 08/31/2018  . Seasonal and perennial allergic rhinitis 08/31/2018  . Controlled type 1 diabetes mellitus with both eyes affected by proliferative  retinopathy without macular edema ([place]) 06/09/2018  . Sore throat 04/26/2018  . Moderate persistent asthma with acute exacerbation 04/26/2018  . Other allergic rhinitis 04/26/2018  . Gastroesophageal reflux disease 04/26/2018  . Food allergy 04/26/2018  . Pseudotumor cerebri 03/25/2018  . Morbid obesity with BMI of 40.0-44.9, adult ([place]) 03/25/2018  . Altitude sickness, subsequent encounter 03/25/2018  . Encounter for medication management 03/25/2018  . Snoring 03/25/2018  . Increased intracranial pressure 03/20/2018  . Low back pain 03/10/2018  . Cervicalgia of occipito-atlanto-axial region 08/31/2017  . Type 1 diabetes mellitus with complication ([place]) 33/35/4562  . Calcified lymph nodes 06/04/2017  . Insulin pump in place 05/28/2017  . Neuropathy 05/28/2017  . 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 Other symptoms and signs involving the nervous system 05/28/2017  . Nightmares REM-sleep type 05/28/2017  . Proliferative diabetic retinopathy, right eye ([place]) 04/11/2017  . Benign paroxysmal positional vertigo 03/29/2017  . Costochondritis 03/06/2017  . Acute upper respiratory infection 03/06/2017  . Combined hyperlipidemia 02/11/2016  . Metatarsalgia of right foot 02/07/2016  . Lactose intolerance 01/07/2016  . Inappropriate sinus tachycardia 06/29/2015  . Thoracic back pain 11/02/2013  . Type 1 diabetes mellitus not at goal [facility])   . Goiter   . Obesity   . Hypercholesterolemia   . ADHD (attention deficit hyperactivity disorder)   . Fatigue   . Tachycardia   . Autonomic neuropathy associated with type 1 diabetes mellitus ([place])   . Hypertension   . Uncontrolled DM with microalbuminuria or microproteinuria   . Hypoglycemia associated with diabetes (  [place])   . DIABETES MELLITUS, I 12/24/2006  . ATTENTION DEFICIT, W/HYPERACTIVITY 12/24/2006    History obtained from: chart review and patient.  L[facility]  [facility]'s Primary Care Provider is Tisovec, Fransico Him, MD.     [name] is a 37 y.o. male presenting for a follow up visit.  He has a history of severe persistent asthma as well as reflux.  He is well-known to this clinic and presents for sick visit on a routine basis.  At the last visit, he was continued on his immunotherapy.  He was also continued on Asmanex 2 puffs twice daily, Singulair 10 mg daily, and Pulmicort 0.5 mg nasal saline rinses daily.  His reflux was not well controlled and he was continued on ranitidine 150 to 300 mg in the evening and omeprazole 20 mg was added in the morning.  I last met him in August 2019 when we made the decision to start to pick sent.  Since the last visit, he has mostly done well.  However, he has had shortness of breath since last Wednesday injection with some chest discomfort.  He has had no fevers but he does endorse a headache.  He has not tried his albuterol at all.  He remains on Asmanex 2 puffs twice daily.  He does use a spacer.  His overall have remained uncontrolled over the last several months. ACT today is 16 indicating subpar asthma control.  Review of his recent treatments shows that he has been on combined ICS/LABAs including Symbicort, but he is exquisitely susceptible to getting thrush. This has limited his controllers overall. In fact, he reports that he typically just takes nystatin daily to help prevent the development of thrush. He is currently on Asmanex two puffs BID, which is the only ICS that seems to not result in the development of thrush.   He received prednisone in July 2019 and August 2019, although his ICS was increased on a couple of other occasions. Overall we are very careful to avoid prednisone bursts, given his history of type 1 diabetes.   Otherwise, there have been no changes to his past medical history, surgical history, family history, or social history.    Review of Systems: a 14-point review of systems is pertinent for what  is mentioned in HPI.  Otherwise, all other systems were negative.  Constitutional: negative other than that listed in the HPI Eyes: negative other than that listed in the HPI Ears, nose, mouth, throat, and face: negative other than that listed in the HPI Respiratory: negative other than that listed in the HPI Cardiovascular: negative other than that listed in the HPI Gastrointestinal: negative other than that listed in the HPI Genitourinary: negative other than that listed in the HPI Integument: negative other than that listed in the HPI Hematologic: negative other than that listed in the HPI Musculoskeletal: negative other than that listed in the HPI Neurological: negative other than that listed in the HPI Allergy/Immunologic: negative other than that listed in the HPI    Objective:   Blood pressure 102/76, pulse 92, resp. rate 20, SpO2 97 %. There is no height or weight on file to calculate BMI.   Physical Exam:  General: Alert, interactive, in no acute distress. Eyes: No conjunctival injection bilaterally, no discharge on the right, no discharge on the left, no Horner-Trantas dots present and allergic shiners present bilaterally. PERRL bilaterally. EOMI without pain. No photophobia.  Ears: Right TM pearly gray with normal light reflex, Left TM pearly gray with  normal light reflex, Right TM intact without perforation and Left TM intact without perforation.  Nose/Throat: External nose within normal limits and septum midline. Turbinates edematous and pale with clear discharge. Posterior oropharynx erythematous with cobblestoning in the posterior oropharynx. Tonsils 2+ without exudates.  Tongue without thrush. Lungs: Decreased breath sounds with expiratory wheezing bilaterally. Increased work of breathing. CV: Normal S1/S2. No murmurs. Capillary refill <2 seconds.  Skin: Warm and dry, without lesions or rashes. Neuro:   Grossly intact. No focal deficits appreciated. Responsive to  questions.  Diagnostic studies:   Spirometry: results abnormal (FEV1: 2.64/67%, FVC: 3.12/65%, FEV1/FVC: 104%).    Spirometry consistent with possible restrictive disease. Xopenex/Atrovent nebulizer treatment given in clinic with no improvement, although there was a 24% improvement in the FEF 25-75%.  Allergy Studies: none      Salvatore Marvel, MD  [facility] [place]

## 2018-08-31 NOTE — Patient Instructions (Addendum)
1. Severe persistent asthma with acute exacerbation - Lung testing looked abnormal even after after a nebulizer treatment. - Add on albuterol (ProAir) 4 puffs every four hours OR an albuterol nebulizer treatment for the next few days to see if this helps your symptoms.  - Albuterol nebulizer and teaching provided.  - Increase Asmanex to four puffs twice daily for 1-2 weeks to provide more anti-inflammatory activity in the lungs. - Start the prednisone dose pack if you are not feeling better by the end of the week. - We will start the submission process for Nucala, which is an asthma medication that has the longest safety profile. - You should hear from [place] in the next few days about this submission. - Call us at the end of the week with an update.   2. Seasonal and perennial allergic rhinitis - Continue with allergy shots at the same schedule. - Continue with Pulmicort nasal rinses 2-3 times weekly. - Continue with Singulair daily. - Continue with your antihistamine daily.  3. Return in about 3 months (around 12/01/2018).   Please inform us of any Emergency Department visits, hospitalizations, or changes in symptoms. Call us before going to the ED for breathing or allergy symptoms since we might be able to fit you in for a sick visit. Feel free to contact us anytime with any questions, problems, or concerns.  It was a pleasure to see you again today!  Websites that have reliable patient information: 1. American Academy of Asthma, Allergy, and Immunology: www.aaaai.org 2. Food Allergy Research and Education (FARE): foodallergy.org 3. Mothers of Asthmatics: http://www.asthmacommunitynetwork.org 4. American College of Allergy, Asthma, and Immunology: MissingWeapons.ca   Make sure you are registered to vote! If you have moved or changed any of your contact information, you will need to get this updated before voting!

## 2018-09-03 ENCOUNTER — Other Ambulatory Visit (INDEPENDENT_AMBULATORY_CARE_PROVIDER_SITE_OTHER): Payer: Self-pay | Admitting: "Endocrinology

## 2018-09-03 DIAGNOSIS — IMO0001 Reserved for inherently not codable concepts without codable children: Secondary | ICD-10-CM

## 2018-09-03 DIAGNOSIS — E1065 Type 1 diabetes mellitus with hyperglycemia: Principal | ICD-10-CM

## 2018-09-06 ENCOUNTER — Encounter: Payer: Self-pay | Admitting: Allergy & Immunology

## 2018-09-08 ENCOUNTER — Telehealth: Payer: Self-pay | Admitting: Allergy & Immunology

## 2018-09-08 NOTE — Telephone Encounter (Signed)
Awesome to hear. Let's reach out to him tomorrow.   Malachi BondsJoel Gallagher, MD [facility]

## 2018-09-08 NOTE — Telephone Encounter (Signed)
Patient has called stating that he has been on prednisone for 24 hours - seen slight improvement - so will continue meds until finished Will keep the office updated on any changes

## 2018-09-13 ENCOUNTER — Encounter: Payer: Self-pay | Admitting: Allergy & Immunology

## 2018-09-14 ENCOUNTER — Ambulatory Visit (INDEPENDENT_AMBULATORY_CARE_PROVIDER_SITE_OTHER): Payer: 59 | Admitting: Allergy & Immunology

## 2018-09-14 ENCOUNTER — Ambulatory Visit
Admission: RE | Admit: 2018-09-14 | Discharge: 2018-09-14 | Disposition: A | Discharge status: Home / Self Care | Payer: 59 | Source: Ambulatory Visit | Attending: Allergy & Immunology | Admitting: Allergy & Immunology

## 2018-09-14 ENCOUNTER — Encounter: Payer: Self-pay | Admitting: Allergy & Immunology

## 2018-09-14 VITALS — BP 124/62 | HR 101 | Temp 98.2°F | Resp 18 | Ht 70.1 in | Wt 263.8 lb

## 2018-09-14 DIAGNOSIS — J4551 Severe persistent asthma with (acute) exacerbation: Secondary | ICD-10-CM | POA: Diagnosis not present

## 2018-09-14 DIAGNOSIS — R042 Hemoptysis: Secondary | ICD-10-CM | POA: Diagnosis not present

## 2018-09-14 DIAGNOSIS — J302 Other seasonal allergic rhinitis: Secondary | ICD-10-CM | POA: Diagnosis not present

## 2018-09-14 DIAGNOSIS — J4550 Severe persistent asthma, uncomplicated: Secondary | ICD-10-CM

## 2018-09-14 DIAGNOSIS — J3089 Other allergic rhinitis: Secondary | ICD-10-CM

## 2018-09-14 MED ORDER — AMOXICILLIN-POT CLAVULANATE 875-125 MG PO TABS: 1.0000 | ORAL_TABLET | Freq: Two times a day (BID) | ORAL | 0 refills | Status: AC

## 2018-09-14 MED ORDER — LEVALBUTEROL TARTRATE 45 MCG/ACT IN AERO: 2.0000 | INHALATION_SPRAY | RESPIRATORY_TRACT | 1 refills | Status: DC | PRN

## 2018-09-14 MED ORDER — MEPOLIZUMAB 100 MG ~~LOC~~ SOLR
100.0000 mg | SUBCUTANEOUS | Status: DC
  Administered 2018-09-14 – 2019-02-16 (×6): 100 mg via SUBCUTANEOUS

## 2018-09-14 MED ORDER — BUDESONIDE-FORMOTEROL FUMARATE 160-4.5 MCG/ACT IN AERO: 2.0000 | INHALATION_SPRAY | Freq: Two times a day (BID) | RESPIRATORY_TRACT | 5 refills | Status: DC

## 2018-09-14 NOTE — Patient Instructions (Addendum)
1. Severe persistent asthma - feeling better today, but his spirometry looks worse - Lung testing looked worse than last time and it did not move much with the nebulizer treatment.  - We did give your first dose of Nucala today. - We are going to get a chest-X-ray today since you are having a bloody cough. - Since we just going to keep you on nystatin to control your thrush, we will just go ahead and change you to Symbicort which will provide better control of your symptoms.  - Symbicort contains a long acting albuterol in conjunction with a long acting steroid. - Spacer use reviewed. - Daily controller medication(s): Singulair 10mg  daily and Symbicort 160/4.315mcg two puffs twice daily with spacer + Nucala every four weeks - Prior to physical activity: Xopenex 2 puffs 10-15 minutes before physical activity. - Rescue medications: Xopenex 4 puffs every 4-6 hours as needed - Changes during respiratory infections or worsening symptoms: Add on Asmanex 2 puffs twice daily for TWO WEEKS. - Asthma control goals:  * Full participation in all desired activities (may need albuterol before activity) * Albuterol use two time or less a week on average (not counting use with activity) * Cough interfering with sleep two time or less a month * Oral steroids no more than once a year * No hospitalizations  2. Seasonal and perennial allergic rhinitis - Continue with allergy shots at the same schedule. - Continue with Pulmicort nasal rinses 2-3 times weekly. - Continue with Singulair daily. - Continue with your antihistamine daily.   3. Acute sinusitis - With your current symptoms and time course, antibiotics are needed: Augmentin 875mg  twice daily for 14 days - Add on nasal saline spray (i.e., Simply Saline) or nasal saline lavage (i.e., NeilMed) as needed prior to medicated nasal sprays. - For thick post nasal drainage, add guaifenesin [phone] mg (Mucinex) twice daily as needed for mucous thinning with  adequate hydration to help it work.   4. Return in about 4 weeks (around 10/12/2018).   Please inform us of any Emergency Department visits, hospitalizations, or changes in symptoms. Call us before going to the ED for breathing or allergy symptoms since we might be able to fit you in for a sick visit. Feel free to contact us anytime with any questions, problems, or concerns.  It was a pleasure to see you again today!  Websites that have reliable patient information: 1. American Academy of Asthma, Allergy, and Immunology: www.aaaai.org 2. Food Allergy Research and Education (FARE): foodallergy.org 3. Mothers of Asthmatics: http://www.asthmacommunitynetwork.org 4. American College of Allergy, Asthma, and Immunology: MissingWeapons.cawww.acaai.org   Make sure you are registered to vote! If you have moved or changed any of your contact information, you will need to get this updated before voting!

## 2018-09-14 NOTE — Progress Notes (Signed)
FOLLOW UP  Date of Service/Encounter:  09/14/18   Assessment:   Severe persistent asthma with acute exacerbation - with a history of recurrent prednisone courses and failed Dupixent (iniated Nucala today)  Hemoptysis - with clear CXR today  Seasonal and perennial allergic rhinitis  Gastroesophageal reflux disease  Complicated past medical history, including pseudotumor cerebri and type 1 diabetes mellitus   Asthma Reportables:  Severity: severe persistent  Risk: high Control: very poorly controlled   Plan/Recommendations:   1. Severe persistent asthma - feeling better today, but his spirometry looks worse - Lung testing looked worse than last time and it did not move much with the nebulizer treatment.  - We did give your first dose of Nucala today. - We are going to get a chest-X-ray today since you are having a bloody cough (update: this was negative, sent patient a MyChart message with these results). - Since we just going to keep you on nystatin to control your thrush, we will just go ahead and change you to Symbicort which will provide better control of your symptoms.  - Symbicort contains a long acting albuterol in conjunction with a long acting steroid. - Spacer use reviewed. - Daily controller medication(s): Singulair 10mg  daily and Symbicort 160/4.9mcg two puffs twice daily with spacer + Nucala every four weeks - Prior to physical activity: Xopenex 2 puffs 10-15 minutes before physical activity. - Rescue medications: Xopenex 4 puffs every 4-6 hours as needed - Changes during respiratory infections or worsening symptoms: Add on Asmanex 2 puffs twice daily for TWO WEEKS. - Asthma control goals:  * Full participation in all desired activities (may need albuterol before activity) * Albuterol use two time or less a week on average (not counting use with activity) * Cough interfering with sleep two time or less a month * Oral steroids no more than once a year * No  hospitalizations  2. Seasonal and perennial allergic rhinitis - Continue with allergy shots at the same schedule. - Continue with Pulmicort nasal rinses 2-3 times weekly. - Continue with Singulair daily. - Continue with your antihistamine daily.   3. Acute sinusitis - With your current symptoms and time course, antibiotics are needed: Augmentin 875mg  twice daily for 14 days - Add on nasal saline spray (i.e., Simply Saline) or nasal saline lavage (i.e., NeilMed) as needed prior to medicated nasal sprays. - For thick post nasal drainage, add guaifenesin [phone] mg (Mucinex) twice daily as needed for mucous thinning with adequate hydration to help it work.   4. Return in about 4 weeks (around 10/12/2018).  Subjective:   [name] is a 37 y.o. male presenting today for follow up of  Chief Complaint  Patient presents with  . Follow-up    Still sick from last visit    [name] has a history of the following: Patient Active Problem List   Diagnosis Date Noted  . Severe persistent asthma with acute exacerbation 08/31/2018  . Seasonal and perennial allergic rhinitis 08/31/2018  . Controlled type 1 diabetes mellitus with both eyes affected by proliferative retinopathy without macular edema (HCC) 06/09/2018  . Sore throat 04/26/2018  . Moderate persistent asthma with acute exacerbation 04/26/2018  . Other allergic rhinitis 04/26/2018  . Gastroesophageal reflux disease 04/26/2018  . Food allergy 04/26/2018  . Pseudotumor cerebri 03/25/2018  . Morbid obesity with BMI of 40.0-44.9, adult (HCC) 03/25/2018  . Altitude sickness, subsequent encounter 03/25/2018  . Encounter for medication management 03/25/2018  . Snoring 03/25/2018  .  Increased intracranial pressure 03/20/2018  . Low back pain 03/10/2018  . Cervicalgia of occipito-atlanto-axial region 08/31/2017  . Type 1 diabetes mellitus with complication (HCC) 08/31/2017  . Calcified lymph nodes 06/04/2017  .  Insulin pump in place 05/28/2017  . Neuropathy 05/28/2017  . 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 Other symptoms and signs involving the nervous system 05/28/2017  . Nightmares REM-sleep type 05/28/2017  . Proliferative diabetic retinopathy, right eye (HCC) 04/11/2017  . Benign paroxysmal positional vertigo 03/29/2017  . Costochondritis 03/06/2017  . Acute upper respiratory infection 03/06/2017  . Combined hyperlipidemia 02/11/2016  . Metatarsalgia of right foot 02/07/2016  . Lactose intolerance 01/07/2016  . Inappropriate sinus tachycardia 06/29/2015  . Thoracic back pain 11/02/2013  . Type 1 diabetes mellitus not at goal [facility])   . Goiter   . Obesity   . Hypercholesterolemia   . ADHD (attention deficit hyperactivity disorder)   . Fatigue   . Tachycardia   . Autonomic neuropathy associated with type 1 diabetes mellitus (HCC)   . Hypertension   . Uncontrolled DM with microalbuminuria or microproteinuria   . Hypoglycemia associated with diabetes (HCC)   . DIABETES MELLITUS, I 12/24/2006  . ATTENTION DEFICIT, W/HYPERACTIVITY 12/24/2006    History obtained from: chart review and patient.  [name]'s Primary Care Provider is Tisovec, Adelfa Koh, MD.     [name] is a 37 y.o. male presenting for a sick visit.  He was last seen around 2 weeks ago with an acute asthma exacerbation.  At that time, we gave him a prednisone pack to start if his symptoms worsen.  We increase his Asmanex to 4 puffs twice daily for 1 to 2 weeks.  We also started the approval process for mepolizumab.  For his allergic rhinitis, we continued his allergy shots at the same schedule as well as Pulmicort nasal rinses and Singulair.  He has a complex medical history including type 1 diabetes and pseudotumor cerebri which limits his ability to use prednisone.  Asmanex has been the only inhaler that has seemed to avoid the development of  thrush.  He has been on dupilumab in the past but continued to require prednisone even with this.    Since the last visit, he has felt that he was breathing better.  He does report that he feels that he is moving better air.  However, yesterday, he had a coughing episode which resulted in bleeding.  He reports that he saw clots of blood and then had "free-flowing" blood.  However, he manages at home and did not go to the ER.  He remains on the Asmanex 4 puffs in the morning and 4 puffs at night.  He has been using his nebulizer during the day around 1-2 times.  He does endorse palpitations with use of the albuterol nebulizer.  He tells me today that he is having bilateral ear fullness.  He also has chronic congestion.  He stopped his budesonide rinses because he developed nosebleeds.  His last budesonide rinse was Sunday.  He has not needed antibiotics in quite some time.  He did stop the nasal rinses with budesonide Sunday because he developed a nose bleed. He also reports that he had coughing with a clots of blood with "free flowing". He does report some fullness and pain in both ears. Last antibiotic was a while ago.   He remains on the Asmanex four puffs in the morning and four puffs at night.  He has been using the nebulizer during the day, but he does not like how it increases his heart rate.  He has been on a multitude of various controllers in the past, but he tends to get thrush.  Some Dr. [name] has been trying to manage his asthma without inhaled steroids.  Asmanex seems to be the only one that does not cause thrush.  He has been on a multitude of combined inhalers in the past, but he does not remember how he responded to them.  He thinks they were all stopped because of the thrush.  But at this point, he is now on nystatin 2-3 times a day swish and spit anyway.  Otherwise, there have been no changes to his past medical history, surgical history, family history, or social history.    Review  of Systems: a 14-point review of systems is pertinent for what is mentioned in HPI.  Otherwise, all other systems were negative.  Constitutional: negative other than that listed in the HPI Eyes: negative other than that listed in the HPI Ears, nose, mouth, throat, and face: negative other than that listed in the HPI Respiratory: negative other than that listed in the HPI Cardiovascular: negative other than that listed in the HPI Gastrointestinal: negative other than that listed in the HPI Genitourinary: negative other than that listed in the HPI Integument: negative other than that listed in the HPI Hematologic: negative other than that listed in the HPI Musculoskeletal: negative other than that listed in the HPI Neurological: negative other than that listed in the HPI Allergy/Immunologic: negative other than that listed in the HPI    Objective:   Blood pressure 124/62, pulse (!) 101, temperature 98.2 F (36.8 C), temperature source Oral, resp. rate 18, height 5' 10.1" (1.781 m), weight 263 lb 12.8 oz (119.7 kg), SpO2 97 %. Body mass index is 37.74 kg/m.   Physical Exam:  General: Alert, interactive, in no acute distress.  Obese.  Pleasant.  Appears fairly comfortable. Eyes: No conjunctival injection bilaterally, no discharge on the right, no discharge on the left and no Horner-Trantas dots present. PERRL bilaterally. EOMI without pain. No photophobia.  Ears: Right TM pearly gray with normal light reflex, Left TM pearly gray with normal light reflex, Right TM intact without perforation and Left TM intact without perforation.  Nose/Throat: External nose within normal limits and septum midline. Turbinates edematous and pale with clear discharge. Posterior oropharynx erythematous without cobblestoning in the posterior oropharynx. Tonsils 2+ without exudates.  Tongue without thrush. Lungs: Mildly decreased breath sounds bilaterally without wheezing, rhonchi or rales. No increased work of  breathing. CV: Normal S1/S2. No murmurs. Capillary refill <2 seconds.  Skin: Warm and dry, without lesions or rashes. Neuro:   Grossly intact. No focal deficits appreciated. Responsive to questions.  Diagnostic studies:   Allergy Studies: Overall FEV1 and FVC are lower compared to his last visit, but this does not correlate with his reported improvement in his symptoms.       Malachi BondsJoel Pavel Gadd, MD  [facility]

## 2018-09-15 ENCOUNTER — Other Ambulatory Visit: Payer: Self-pay | Admitting: *Deleted

## 2018-09-15 ENCOUNTER — Telehealth: Payer: Self-pay | Admitting: *Deleted

## 2018-09-15 NOTE — Telephone Encounter (Signed)
Pharmacy sent a PA request for Levalbuterol, a PA was performed, Insurance faxed a form confirming that Levalbuterol was covered on the patient's insurance and that a PA was not needed. Called the pharmacy to inform, they stated that it is covered but will cost the patient $68.00. Called the patient and informed of the cost of the medication. Patient verbalized understanding.

## 2018-09-16 ENCOUNTER — Encounter: Payer: Self-pay | Admitting: Allergy & Immunology

## 2018-09-17 ENCOUNTER — Encounter: Payer: Self-pay | Admitting: Allergy & Immunology

## 2018-09-22 ENCOUNTER — Ambulatory Visit: Payer: 59 | Admitting: Gastroenterology

## 2018-09-27 ENCOUNTER — Other Ambulatory Visit: Payer: Self-pay | Admitting: Allergy

## 2018-09-27 DIAGNOSIS — H6983 Other specified disorders of Eustachian tube, bilateral: Secondary | ICD-10-CM

## 2018-09-27 DIAGNOSIS — Z91018 Allergy to other foods: Secondary | ICD-10-CM

## 2018-09-27 DIAGNOSIS — J3089 Other allergic rhinitis: Secondary | ICD-10-CM

## 2018-10-05 ENCOUNTER — Encounter: Payer: Self-pay | Admitting: Allergy and Immunology

## 2018-10-05 ENCOUNTER — Ambulatory Visit: Payer: 59 | Admitting: Allergy and Immunology

## 2018-10-05 VITALS — BP 114/78 | HR 103 | Resp 16 | Ht 72.0 in | Wt 261.0 lb

## 2018-10-05 DIAGNOSIS — B370 Candidal stomatitis: Secondary | ICD-10-CM | POA: Diagnosis not present

## 2018-10-05 DIAGNOSIS — K219 Gastro-esophageal reflux disease without esophagitis: Secondary | ICD-10-CM | POA: Diagnosis not present

## 2018-10-05 DIAGNOSIS — J3089 Other allergic rhinitis: Secondary | ICD-10-CM

## 2018-10-05 DIAGNOSIS — J4550 Severe persistent asthma, uncomplicated: Secondary | ICD-10-CM

## 2018-10-05 MED ORDER — NYSTATIN 100000 UNIT/ML MT SUSP: OROMUCOSAL | 2 refills | Status: DC

## 2018-10-05 MED ORDER — FAMOTIDINE 40 MG PO TABS: 40.0000 mg | ORAL_TABLET | Freq: Every day | ORAL | 5 refills | Status: DC

## 2018-10-05 NOTE — Progress Notes (Signed)
Follow-up Note  Referring Provider: Gaspar Garbe, MD Primary Provider: Gaspar Garbe, MD Date of Office Visit: 10/05/2018  Subjective:   [name] (DOB: Apr 06, 1981) is a 37 y.o. male [facility] in re-evaluation of the following:  HPI: [name] returns to this clinic in reevaluation of asthma and allergic rhinitis and reflux and a history of recurrent infections.  I last saw him in this clinic 14 July 2018.     At the beginning of November he became quite sick with a respiratory tract infection manifested as nasal congestion and sneezing and feeling bad in general along with coughing and wheezing for which he had seen Dr. Dellis Anes on several occasions and has required the administration of a broad-spectrum antibiotic and a systemic steroid.  Fortunately, he is finally starting to come out of this event and he feels a little bit better at this point with less congestion of his nose and less congestion of his chest and a dramatically decreased requirement for short acting bronchodilator.  Dr. Dellis Anes started him on mepolizumab injections 14 September 2018.  And he continues on a very large collection of medical therapy directed against respiratory tract inflammation.  Currently he is using high-dose Asmanex although it was recommended that he use Symbicort by Dr. Dellis Anes during his last visit.  Apparently he has had some form of side effects when using Symbicort in the past.  He also continues to use montelukast pretty consistently and continues to use nystatin swish and swallow which is prevented him from developing significant issues with oral cavity candidiasis secondary to his inhalers and other medication use.  He continues on immunotherapy as well.  His reflux is now being treated with famotidine rather than ranitidine.  Allergies as of 10/05/2018      Reactions   Flonase [fluticasone] Shortness Of Breath   Sulfites Itching, Swelling   Molds & Smuts    Tetracyclines & Related Other (See Comments)   increased intracranial pressure      Medication List      acetaZOLAMIDE 500 MG capsule Commonly known as:  DIAMOX Take 1000 mg in am and 500 mg in evening   albuterol 108 (90 Base) MCG/ACT inhaler Commonly known as:  PROVENTIL HFA;VENTOLIN HFA Inhale 2 puffs into the lungs every 6 (six) hours as needed for wheezing or shortness of breath.   albuterol (2.5 MG/3ML) 0.083% nebulizer solution Commonly known as:  PROVENTIL Take 3 mLs (2.5 mg total) by nebulization every 4 (four) hours as needed for wheezing or shortness of breath.   atorvastatin 20 MG tablet Commonly known as:  LIPITOR   AUVI-Q 0.3 mg/0.3 mL Soaj injection Generic drug:  EPINEPHrine Use as directed for life-threatening allergic reaction.   budesonide 0.5 MG/2ML nebulizer solution Commonly known as:  PULMICORT Take 2 mLs (0.5 mg total) by nebulization as directed.   clonazePAM 0.5 MG tablet Commonly known as:  KLONOPIN Take 0.5 mg by mouth 2 (two) times daily as needed.   famotidine 40 MG tablet Commonly known as:  PEPCID Take 1 tablet (40 mg total) by mouth daily.   glucagon 1 MG injection Follow package directions for low blood sugar.   HUMALOG 100 UNIT/ML injection Generic drug:  insulin lispro INJECT 300 UNITS UNDER THE SKIN VIA INSULIN PUMP EVERY 48-72 HOURS PER HYPERGLYCEMIA AND DKA PROTOCOLS   levalbuterol 45 MCG/ACT inhaler Commonly known as:  XOPENEX HFA Inhale 2 puffs into the lungs every  4 (four) hours as needed for wheezing.   losartan 50 MG tablet Commonly known as:  COZAAR TAKE 1 TABLET(50 MG) BY MOUTH DAILY   Mometasone Furoate 200 MCG/ACT Aero Inhale 2 puffs into the lungs 2 (two) times daily.   montelukast 10 MG tablet Commonly known as:  SINGULAIR TAKE 1 TABLET(10 MG) BY MOUTH AT BEDTIME   nystatin 100000 UNIT/ML suspension Commonly known as:  MYCOSTATIN Swish and swallow 5 mls  daily   omeprazole 20 MG capsule Commonly known as:  PRILOSEC Take 20 mg by mouth daily.   ONE TOUCH ULTRA TEST test strip Generic drug:  glucose blood USE AS DIRECTED SIX TIMES DAILY   ONE TOUCH ULTRA TEST test strip Generic drug:  glucose blood USE TO CHECK BLOOD SUGAR SIX TIMES DAILY   TYLENOL 500 MG tablet Generic drug:  acetaminophen Take 1,000 mg by mouth 2 (two) times daily as needed.       Past Medical History:  Diagnosis Date  . ADHD (attention deficit hyperactivity disorder)   . Autonomic neuropathy due to diabetes (HCC)   . Diabetes (HCC)   . Fatigue   . Goiter   . High blood pressure   . High cholesterol   . Hypercholesterolemia   . Hypertension   . Hypoglycemia associated with diabetes (HCC)   . Obesity   . Tachycardia   . Type 1 diabetes mellitus not at goal [facility])   . Uncontrolled DM with microalbuminuria or microproteinuria     Past Surgical History:  Procedure Laterality Date  . REFRACTIVE SURGERY     x9  . WISDOM TOOTH EXTRACTION      Review of systems negative except as noted in HPI / PMHx or noted below:  Review of Systems  Constitutional: Negative.   HENT: Negative.   Eyes: Negative.   Respiratory: Negative.   Cardiovascular: Negative.   Gastrointestinal: Negative.   Genitourinary: Negative.   Musculoskeletal: Negative.   Skin: Negative.   Neurological: Negative.   Endo/Heme/Allergies: Negative.   Psychiatric/Behavioral: Negative.      Objective:   Vitals:   10/05/18 1602  BP: 114/78  Pulse: (!) 103  Resp: 16  SpO2: 97%   Height: 6' (182.9 cm)  Weight: 261 lb (118.4 kg)   Physical Exam  Constitutional:  Slightly nasal voice  HENT:  Head: Normocephalic.  Right Ear: Tympanic membrane, external ear and ear canal normal.  Left Ear: Tympanic membrane, external ear and ear canal normal.  Nose: Nose normal. No mucosal edema or rhinorrhea.  Mouth/Throat: Uvula is midline, oropharynx is clear and moist and mucous membranes  are normal. No oropharyngeal exudate.  Eyes: Conjunctivae are normal.  Neck: Trachea normal. No tracheal tenderness present. No tracheal deviation present. No thyromegaly present.  Cardiovascular: Normal rate, regular rhythm, S1 normal, S2 normal and normal heart sounds.  No murmur heard. Pulmonary/Chest: Breath sounds normal. No stridor. No respiratory distress. He has no wheezes. He has no rales.  Musculoskeletal: He exhibits no edema.  Lymphadenopathy:       Head (right side): No tonsillar adenopathy present.       Head (left side): No tonsillar adenopathy present.    He has no cervical adenopathy.  Neurological: He is alert.  Skin: No rash noted. He is not diaphoretic. No erythema. Nails show no clubbing.    Diagnostics:    Spirometry was performed and demonstrated an FEV1 of 2.83 at 62 % of predicted.  The patient had an Asthma Control Test with the following  results: ACT Total Score: 8.    Assessment and Plan:   1. Not well controlled severe persistent asthma   2. Perennial allergic rhinitis   3. Gastroesophageal reflux disease, esophagitis presence not specified   4. Oral candidiasis     1. Continue to perform Allergen avoidance measures as best as possible  2. Continue immunotherapy and Auvi-Q  3. Continue to Treat and prevent inflammation:   A. Asmanex 200 -2 puffs 2 times a day with a spacer   B. montelukast 10 mg tablet once a day  C. Budesonide 0.5 mg in 100 ml nasal saline 1-7 times per week   D.  Mepolizumab every 4 weeks   4. Treat reflux:   A.  Famotidine 40 mg tablet 1 time per day  5. Continue nystatin oral solution 5 mL swish and swallow every day    6. If needed:   A. nasal saline / wash  B. OTC antihistamine  C. Auvi-Q 0.3  D. Proair HFA / Xopenex 2 inhalations or albuterol every 4-6 hours  E. Ibuprofen  7. Return to clinic in 12 weeks or earlier if problem.    8. Blood - ANA w/reflex, ANCA w/reflex, RF, CCP  Fortunately [name] appears to  be improving from his very significant respiratory event which I suspect was viral in nature.  In review of his status over the course of the past 2 years he has had recurrent infections and recurrent episodes of inflammation of his airway and overall has had a difficult time with his medical problems in general.  At this point he will remain on anti-inflammatory agents for his airway including the use of mepolizumab every 4 weeks and continue to treat reflux as noted above and remain on preventative nystatin for oral candidiasis.  To be complete we will see if there is evidence of an autoimmune disease contributing to some of his complaints with the screening blood tests noted above.  We have already checked for a deficiency of his immune system in the past which does not appear to be the problem giving rise to his respiratory track issues and other issues.  Hopefully the administration of mepolizumab will give rise to a decrease in his baseline respiratory tract inflammation.  Laurette Schimke, MD Allergy / Immunology [facility]

## 2018-10-05 NOTE — Patient Instructions (Addendum)
  1. Continue to perform Allergen avoidance measures as best as possible  2. Continue immunotherapy and Auvi-Q  3. Continue to Treat and prevent inflammation:   A. Asmanex 200 -2 puffs 2 times a day with a spacer   B. montelukast 10 mg tablet once a day  C. Budesonide 0.5 mg in 100 ml nasal saline 1-7 times per week   D.  Mepolizumab every 4 weeks   4. Treat reflux:   A.  Famotidine 40 mg tablet 1 time per day  5. Continue nystatin oral solution 5 mL swish and swallow every day    6. If needed:   A. nasal saline / wash  B. OTC antihistamine  C. Proair HFA / Xopenex 2 inhalations or albuterol every 4-6 hours  7. Return to clinic in 12 weeks or earlier if problem.    8. Blood - ANA w/reflex, ANCA w/reflex, RF, CCP

## 2018-10-06 ENCOUNTER — Encounter: Payer: Self-pay | Admitting: Allergy and Immunology

## 2018-10-06 ENCOUNTER — Telehealth: Payer: Self-pay

## 2018-10-06 ENCOUNTER — Other Ambulatory Visit: Payer: Self-pay

## 2018-10-06 DIAGNOSIS — J3089 Other allergic rhinitis: Secondary | ICD-10-CM

## 2018-10-06 DIAGNOSIS — B370 Candidal stomatitis: Secondary | ICD-10-CM

## 2018-10-06 DIAGNOSIS — J4550 Severe persistent asthma, uncomplicated: Secondary | ICD-10-CM

## 2018-10-06 DIAGNOSIS — K219 Gastro-esophageal reflux disease without esophagitis: Secondary | ICD-10-CM

## 2018-10-06 NOTE — Addendum Note (Signed)
Addended by: Osa CraverGUIJOZA, Grover Woodfield M on: 10/06/2018 08:49 AM   Modules accepted: Orders

## 2018-10-06 NOTE — Telephone Encounter (Signed)
Patient notified of labs and will arrive sometime today to have blood work done

## 2018-10-06 NOTE — Telephone Encounter (Signed)
-----   Message from Jessica PriestEric J Kozlow, MD sent at 10/05/2018  7:17 PM EST ----- Please inform patient that he needs to obtain the following blood tests to exclude a autoimmune disease giving rise to some of his issues. Can have them draw in clinic.

## 2018-10-08 ENCOUNTER — Telehealth: Payer: Self-pay

## 2018-10-08 LAB — ANCA TITERS
Atypical pANCA: <1:20 {titer}
C-ANCA: <1:20 {titer}
P-ANCA: <1:20 {titer}

## 2018-10-08 LAB — CYCLIC CITRUL PEPTIDE ANTIBODY, IGG/IGA: CYCLIC CITRULLIN PEPTIDE AB: 7 U (ref 0–19)

## 2018-10-08 LAB — RHEUMATOID FACTOR: Rhuematoid fact SerPl-aCnc: <10 IU/mL (ref 0.0–13.9)

## 2018-10-08 LAB — ANA W/REFLEX: ANA: NEGATIVE

## 2018-10-08 NOTE — Telephone Encounter (Signed)
Prior authorization requested for famotidine 40 mg. I have submitted this via covermymeds. I will await approval/denial.

## 2018-10-08 NOTE — Telephone Encounter (Signed)
Approved and sent to the pharmacy.  

## 2018-10-12 ENCOUNTER — Ambulatory Visit (INDEPENDENT_AMBULATORY_CARE_PROVIDER_SITE_OTHER): Payer: 59 | Admitting: *Deleted

## 2018-10-12 DIAGNOSIS — J4550 Severe persistent asthma, uncomplicated: Secondary | ICD-10-CM | POA: Diagnosis not present

## 2018-10-24 ENCOUNTER — Other Ambulatory Visit: Payer: Self-pay | Admitting: Allergy and Immunology

## 2018-10-27 ENCOUNTER — Other Ambulatory Visit: Payer: Self-pay | Admitting: Allergy

## 2018-10-28 ENCOUNTER — Encounter (INDEPENDENT_AMBULATORY_CARE_PROVIDER_SITE_OTHER): Payer: Self-pay | Admitting: "Endocrinology

## 2018-10-28 ENCOUNTER — Ambulatory Visit (INDEPENDENT_AMBULATORY_CARE_PROVIDER_SITE_OTHER): Payer: 59 | Admitting: "Endocrinology

## 2018-10-28 VITALS — BP 124/82 | HR 80 | Ht 72.0 in | Wt 260.0 lb

## 2018-10-28 DIAGNOSIS — E1043 Type 1 diabetes mellitus with diabetic autonomic (poly)neuropathy: Secondary | ICD-10-CM | POA: Diagnosis not present

## 2018-10-28 DIAGNOSIS — E1029 Type 1 diabetes mellitus with other diabetic kidney complication: Secondary | ICD-10-CM

## 2018-10-28 DIAGNOSIS — E1143 Type 2 diabetes mellitus with diabetic autonomic (poly)neuropathy: Secondary | ICD-10-CM

## 2018-10-28 DIAGNOSIS — E1065 Type 1 diabetes mellitus with hyperglycemia: Secondary | ICD-10-CM

## 2018-10-28 DIAGNOSIS — E049 Nontoxic goiter, unspecified: Secondary | ICD-10-CM

## 2018-10-28 DIAGNOSIS — R000 Tachycardia, unspecified: Secondary | ICD-10-CM | POA: Diagnosis not present

## 2018-10-28 DIAGNOSIS — R5383 Other fatigue: Secondary | ICD-10-CM

## 2018-10-28 DIAGNOSIS — E10649 Type 1 diabetes mellitus with hypoglycemia without coma: Secondary | ICD-10-CM

## 2018-10-28 DIAGNOSIS — K3184 Gastroparesis: Secondary | ICD-10-CM

## 2018-10-28 DIAGNOSIS — I10 Essential (primary) hypertension: Secondary | ICD-10-CM

## 2018-10-28 DIAGNOSIS — J45909 Unspecified asthma, uncomplicated: Secondary | ICD-10-CM

## 2018-10-28 DIAGNOSIS — R809 Proteinuria, unspecified: Secondary | ICD-10-CM

## 2018-10-28 DIAGNOSIS — G932 Benign intracranial hypertension: Secondary | ICD-10-CM

## 2018-10-28 DIAGNOSIS — E063 Autoimmune thyroiditis: Secondary | ICD-10-CM

## 2018-10-28 DIAGNOSIS — IMO0001 Reserved for inherently not codable concepts without codable children: Secondary | ICD-10-CM

## 2018-10-28 DIAGNOSIS — J309 Allergic rhinitis, unspecified: Secondary | ICD-10-CM

## 2018-10-28 LAB — POCT GLUCOSE (DEVICE FOR HOME USE): Glucose Fasting, POC: 155 mg/dL — AB (ref 70–99)

## 2018-10-28 LAB — POCT GLYCOSYLATED HEMOGLOBIN (HGB A1C): HEMOGLOBIN A1C: 6.9 % — AB (ref 4.0–5.6)

## 2018-10-28 NOTE — Progress Notes (Signed)
Subjective:  Patient Name: [name] Date of Birth: 1981/08/16  MRN: [id number]  [name]  presents to the office today for follow-up of his type 1 diabetes mellitus, goiter, obesity, combined hyperlipidemia, adult ADHD, fatigue, autonomic neuropathy, tachycardia, hypertension, microalbuminuria, hypoglycemia, sinusitis, bronchitis, allergies, proliferative retinopathy, and recent increased intracranial hypertension.  HISTORY OF PRESENT ILLNESS:   [name] is a 38 y.o. Caucasian gentleman. [name] was unaccompanied.  1. The patient was first referred to me on 01/08/2006 by his family physician, Dr. Roanna Epley, for evaluation and management of type 1 diabetes mellitus and related problems. The patient was 38 years old.  A. [name] had been diagnosed with type 1 diabetes somewhere in 2001-2002, at the age of 54-19. He was initially diagnosed with type 2 diabetes mellitus, but was later re-classified as type 1 diabetes mellitus. He had been followed by Dr. Arther Dames, staff endocrinologist at [facility]. [name] was started on an insulin pump approximately 18 months prior to first seeing me. His pump was a Medtronic Paradigm 712. His blood glucose control was fair-to-poor. He was frequently not checking blood sugars or taking insulin boluses as often as he needed to. He frequently noted fast heart rate. The patient's past medical history was positive for hypertension, for which he was taking lisinopril, and for what his mother called "extreme ADHD". He had previously been taking ADHD medicines but had discontinued them due to adverse effects. He had prior ankle injuries and knee injuries, to include tears of the lateral menisci bilaterally. He had not had any surgeries. He was working for a [employer] that had many different job sites in different parts of the [place] and in other countries as well. 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 He did not  use tobacco or drugs, but did drink alcohol occasionally.  B. On physical examination, his weight was 234 pounds, his height was 70 inches, his BMI was 33.2. Blood pressure was 128/70. Hemoglobin A1c was 9.5%. Heart rate was 96. He had normal affect and fair insight. He had a 25-30 gram goiter. He had normal 1+ DP pulses in his feet. He had normal sensation in his feet to touch, vibration, and monofilament. His CMP was normal except for glucose of 251. His cholesterol was 257, triglycerides 143, HDL 49, and LDL 179. TSH was 1.017, free T4 was 1.06, and free T3 was 3.3. His urinary microalbumin: creatinine ratio was 20.1 (normal less than 30).   C. The patient clearly needed better blood glucose control, which would only occur if he had better adherence to his plan. He appeared to have autonomic neuropathy, manifested by inappropriate sinus tachycardia.  His thyroid goiter suggested that he might have evolving Hashimoto's disease. He stated that he had lost some weight recently. I encouraged him to check his blood sugars more frequently and to take both correction boluses and food boluses.  2. 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 His hemoglobin A1c values have varied from 8.3-11.2%. Throughout the 12 year period, the patient's autonomic neuropathy and tachycardia have remained essentially the same. On 12/03/10 his total cholesterol was 270, triglycerides 94, HDL 50, and LDL 201. I started him on Crestor, 10 mg per day. He has since begun treatment with atorvastatin and his cholesterol  values had improved. In March 2018 he was converted to a Medtronic 670G pump and Guardian 3 CGM sensor. Since then his BGs have been much better. Unfortunately, beginning in about June 2018 he has also had a series of episodes of  bronchitis and sinusitis, muscle pains, and lethargy that presumably were due in large part to severe allergies. He has been very sick for much of the past year.  His atorvastatin and several other medications were stopped at that time. He has also developed increased intracranial pressure, strep throat, and thrush.   3. The patient's last [facility] visit was on 07/27/18. At that visit we continued his current pump settings.   A. He was sick again with a URI and bronchitis for about two months, but began to improve last week. He had to take antibiotics and took prednisone for about 6 days. His BGs were often much higher during the illness, but often too low, especially if he did not feel like eating. .  B. He has had fewer problems with delayed gastric emptying. He saw Dr. Amada Jupiter at [facility] on 08/10/18. Dr. Doyle Askew discontinued metamucil and started Miralax. Gave is now taking OTC prilosec.  c. His allergies had been better controlled with weekly allergy injections until he got sick again. He has not had any injections in the past two months, but will resume them son. Dr. Lucie Leather started him on another biologic agent for asthma.    D. He did not see Dr. Luciana Axe again in November due to his illness, but will see Dr. Luciana Axe again soon. .  E. He saw Dr. Porfirio Mylar Dohmeier in neurology again on 06/10/18. She continued the Diamox doses of 1000 mg in the morning and 500 mg at night. His HAs still occur daily, but are not very severe and don't last as long.   F. He continues to have problems with lactose intolerance. When he avoids lactose, however, he is asymptomatic. Some foods such as shellfish cause his throat to swell up.   H. He is using Humalog aspart insulin in his insulin pump. He takes losartan, 50 mg/day. He rarely has any coughing unless his allergies act up. .    I. His second sleep study was not bad enough to start on C-pap.    J. His BGs have varied with his illnesses, with steroid usage, and with the  amount of postprandial bloating and constipation that he has. He no longer has to delay his boluses as often, because he is not as worried that his gastric emptying will be too delayed and cause hypoglycemia.     4. Pertinent Review of Systems: Constitutional: [name] feels "better". He is still tired a lot. He is still somewhat sore and achy, but not as bad. He no longer has the random muscle spasms and cramps.  Eyes: As above. His vision is usually good.  Neck: He has not had had any further anterior neck swelling and tenderness.  Heart: The patient has no recent complaints of palpitations, irregular heat beats, chest pain, or chest pressure.  Gastrointestinal: As above. He has nausea only occasionally now.  The patient has no other complaints of excessive hunger, heartburn, acid reflux, upset stomach, stomach aches or pains, or diarrhea. Legs: He occasionally still has the sensation of vibration in his legs, perhaps about every two weeks. These sensations may last for up to 20 minutes. . Muscle mass and strength seem normal. There are no other complaints of numbness, tingling, burning, or pain.  No edema is noted. Feet: There are no obvious foot problems now. There are no complaints of numbness, burning, or pain.  No edema is noted. Hypoglycemia: He was having more low BGs when he was sick.   Emotional: His emotional state is "probably as good as can be expected". "I sometimes get short with people." When he had his follow up appointment with Dr. Piedad Climes in [place] in November 2018, Dr. Shane Crutch decided to wait until [name]'s overall health situation stabilizes before trying any new psych medications.     5. BG printout: We have data from the past 4 weeks. [name] changes sites every 1-4 days. He checks BGs 2-8 times daily, average 4.9 times per day. Average BG is 176, compared with 195 at his last visit and with 166 at his prior visit. BGs varied from 89-376, compared with 101-372 at hs last visit  and with 96-270 at his prior visit. His higher BGs occurred after lunch and dinner. His BGs tend to be lower at from 8 AM to 11 AM.   6. CGM printout: Skin glucose values and BG values correlate very well. Average SG was 156, compared with 154 at his last visit and with 151 at his prior visit  He was in auto mode 98% of the time. He was in zone 75% of the time. His highest SGs again occurred around  2-4 PM. Although he has consumed fewer carbs in the past month, he still sometimes consumes more carbs than he realizes.     PAST MEDICAL, FAMILY, AND SOCIAL HISTORY:  Past Medical History:  Diagnosis Date  . ADHD (attention deficit hyperactivity disorder)   . Autonomic neuropathy due to diabetes (HCC)   . Diabetes (HCC)   . Fatigue   . Goiter   . High blood pressure   . High cholesterol   . Hypercholesterolemia   . Hypertension   . Hypoglycemia associated with diabetes (HCC)   . Obesity   . Tachycardia   . Type 1 diabetes mellitus not at goal [facility])   . Uncontrolled DM with microalbuminuria or microproteinuria     Family History  Problem Relation Age of Onset  . Dementia Paternal Aunt   . Schizophrenia Paternal Aunt   . Cancer Maternal Grandmother   . Diabetes Maternal Grandmother        T2 DM  . Cancer Maternal Grandfather   . Heart disease Father   . Thyroid disease Neg Hx      Current Outpatient Medications:  .  acetaminophen (TYLENOL) 500 MG tablet, Take 1,000 mg by mouth 2 (two) times daily as needed., Disp: , Rfl:  .  acetaZOLAMIDE (DIAMOX SEQUELS) 500 MG capsule, Take 1000 mg in am and 500 mg in evening, Disp: 90 capsule, Rfl: 5 .  albuterol (PROAIR HFA) 108 (90 Base) MCG/ACT inhaler, Inhale 2 puffs into the lungs every 6 (six) hours as needed for wheezing or shortness of breath., Disp: 1 Inhaler, Rfl: 2 .  albuterol (PROVENTIL) (2.5 MG/3ML) 0.083% nebulizer solution, Take 3 mLs (2.5 mg total) by nebulization every 4 (four) hours as needed for wheezing or shortness of  breath., Disp: 75 mL, Rfl: 1 .  ASMANEX HFA 200 MCG/ACT AERO, INHALE 2 PUFFS INTO THE LUNGS TWICE DAILY, Disp: 13 g, Rfl: 5 .  atorvastatin (LIPITOR) 20 MG tablet, , Disp: , Rfl: 0 .  AUVI-Q 0.3 MG/0.3ML SOAJ injection, Use as directed for life-threatening allergic reaction., Disp: 4 Device, Rfl: 2 .  budesonide (PULMICORT) 0.5  MG/2ML nebulizer solution, Take 2 mLs (0.5 mg total) by nebulization as directed., Disp: 2 mL, Rfl: 3 .  budesonide-formoterol (SYMBICORT) 160-4.5 MCG/ACT inhaler, Inhale 2 puffs into the lungs 2 (two) times daily., Disp: 1 Inhaler, Rfl: 5 .  glucagon 1 MG injection, Follow package directions for low blood sugar., Disp: 1 each, Rfl: 3 .  HUMALOG 100 UNIT/ML injection, INJECT 300 UNITS UNDER THE SKIN VIA INSULIN PUMP EVERY 48-72 HOURS PER HYPERGLYCEMIA AND DKA PROTOCOLS, Disp: 40 mL, Rfl: 5 .  losartan (COZAAR) 50 MG tablet, TAKE 1 TABLET(50 MG) BY MOUTH DAILY, Disp: 30 tablet, Rfl: 5 .  montelukast (SINGULAIR) 10 MG tablet, TAKE 1 TABLET(10 MG) BY MOUTH AT BEDTIME, Disp: 30 tablet, Rfl: 5 .  nystatin (MYCOSTATIN) 100000 UNIT/ML suspension, Swish and swallow 5 mls daily, Disp: 150 mL, Rfl: 2 .  omeprazole (PRILOSEC) 20 MG capsule, Take 20 mg by mouth daily., Disp: , Rfl:  .  ONE TOUCH ULTRA TEST test strip, USE AS DIRECTED SIX TIMES DAILY, Disp: 200 each, Rfl: 5 .  ONE TOUCH ULTRA TEST test strip, USE TO CHECK BLOOD SUGAR SIX TIMES DAILY, Disp: 600 each, Rfl: 1 .  clonazePAM (KLONOPIN) 0.5 MG tablet, Take 0.5 mg by mouth 2 (two) times daily as needed., Disp: , Rfl: 0 .  famotidine (PEPCID) 40 MG tablet, Take 1 tablet (40 mg total) by mouth daily. (Patient not taking: Reported on 10/28/2018), Disp: 30 tablet, Rfl: 5 .  levalbuterol (XOPENEX HFA) 45 MCG/ACT inhaler, Inhale 2 puffs into the lungs every 4 (four) hours as needed for wheezing. (Patient not taking: Reported on 10/28/2018), Disp: 1 Inhaler, Rfl: 1  Current Facility-Administered Medications:  [name]  Mepolizumab SOLR 100 mg,  100 mg, Subcutaneous, Q28 days, Alfonse Spruce, MD, 100 mg at 10/12/18 1457  Facility-Administered Medications Ordered in Other Visits:  .  gadopentetate dimeglumine (MAGNEVIST) injection 20 mL, 20 mL, Intravenous, Once PRN, Dohmeier, Porfirio Mylar, MD  Allergies as of 10/28/2018 - Review Complete 10/28/2018  Allergen Reaction Noted  . Flonase [fluticasone] Shortness Of Breath 06/10/2017  . Sulfites Itching and Swelling 02/04/2018  . Molds & smuts  03/09/2018  . Tetracyclines & related Other (See Comments) 03/09/2018    1. Work and Family: He is now working part-time at home. 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 He hopes to return to full-time work soon. 2. Activities: He has not done much physical activity this calendar year. He was more physically active when he felt good back in November 2018.    3. Smoking, alcohol, or drugs: He has not ben drinking any alcohol or beer. No tobacco or drugs. 4. Primary Care Provider: Dr. Guerry Bruin, MD, [facility] 5. Neurology: Dr. Vickey Huger 6. Ophthalmology: Dr. Luciana Axe 7. Urology: Dr. Annabell Howells at [facility] 8. Sports medicine: Dr. Darrick Penna  REVIEW OF SYSTEMS: There are no other significant problems involving Sire's other body systems.   Objective:  Vital Signs:  BP 124/82   Pulse 80   Ht 6' (1.829 m)   Wt 260 lb (117.9 kg)   BMI 35.26 kg/m    Ht Readings from Last 3 Encounters:  10/28/18 6' (1.829 m)  10/05/18 6' (1.829 m)  09/14/18 5' 10.1" (1.781 m)   Wt Readings from Last 3 Encounters:  10/28/18 260 lb (117.9 kg)  10/05/18 261 lb (118.4 kg)  09/14/18 263 lb 12.8 oz (119.7 kg)   PHYSICAL EXAM:  Constitutional: The patient appears healthier, but still somewhat tired.  His weight has remained the same. His affect is somewhat subdued. His insight is normal.  Face: The face appears normal.  Eyes: He does not have much allergic conjunctivitis. There is no obvious arcus or proptosis.  Moisture appears normal.  Mouth: The oropharynx and tongue appear normal. Oral moisture is normal. Neck: The neck appears to be visibly normal. No carotid bruits are noted. He has a short neck. His strap muscles are smaller. The thyroid gland is low-lying and is again mildly enlarged, but a bit smaller, at about 20+ grams in size.The consistency of the gland is normal. The thyroid gland is not tender to palpation today.  Lungs: The lungs are clear to auscultation. Air movement is good.  Heart: Heart rate and rhythm are regular. Heart sounds S1 and S2 are normal. I did not appreciate any pathologic cardiac murmurs. Abdomen: The abdomen is enlarged. Bowel sounds are normal. There is no obvious hepatomegaly, splenomegaly, or other mass effect. The abdomen is somewhat tender to deep palpation laterally in both sides. Arms: Muscle size and bulk are normal for age. Hands: There is no tremor of his hands today. Phalangeal and metacarpophalangeal joints are normal. Palmar muscles are normal. Palmar skin is normal. Palmar moisture is also normal. Legs: Muscles appear normal for age. No edema is present. Feet: 1+ right DP pulse, 1+ left DP pulse. Neurologic: Strength is normal for age in both the upper and lower extremities. Muscle tone is normal. Sensation to touch is normal in both legs and both feet.   LAB DATA:   Labs 10/28/18: HbA1c 6.9%, CBG 155  Labs 10/06/18: ANA titer negative, ANCA titers negative, rheumatoid factor negative, cyclic citrullin peptide 7 (ref 0-190  Labs 07/27/18: CBG 193  Labs 05/31/18: HbA1c 6.6%, CBG 202  Labs 03/18/18: CBG 178  Labs 01/13/18: HbA1c 6.8%, CBG 194; TSH 0.98, free T4 1.3; free T3 3.6; CMP normal except glucose 191; urinary microalbumin/creatinine ratio 35 (ref <30)  Labs 11/17/17: CBG 170  Labs 10/13/17: HbA1c 6.8%, CBG 191  Labs 08/24/17: CBG 117  Labs 07/20/17: CBG 216  Labs 07/08/17: IgA, IgG, and IgM normal. CBC normal.  Labs 06/16/17: HbA1c 6.5%, CBG  193  Labs 05/15/17: CBG 209; TSH 0.90, free T4 1.2, free T3 3.8  Labs 03/05/17: HbA1c 7.3%, CBG 241  Labs 02/20/17: CBG 180 fasting  Labs 01/29/17: CBG 148  Labs 01/12/17: CBG 116  Labs 12/29/16: HbA1c 9.1%, CBG 223; TSH 0.62, free T4 1.1, free T3 3.5; urine microalbumin/creatinine ratio 22; cholesterol 171, triglycerides 93, HDL 40, LDL 112;  CMP glucose 201  Labs 09/26/16: HbA1c 9.5%  Labs 08/13/16: Celiac panel negative; CMP normal except for glucose 207; CBC normal  Labs 06/13/16: HbA1c 9.8%; LH 3.6, FSH 2.3, testosterone 533 (ref 250-827), free testosterone 59.4 (ref 47-244)  Labs 02/11/16: HbA1c 9.2%  Labs 01/07/16: HbA1c 10.5%; CBC with Hgb elevated at 17.2%; LH 3.9, FSH 2.0; TSH 1.53, free T4 1.3, free T3 3.7; cholesterol 262, triglycerides 171, HDL 40, LDL 188; urinary microalbumin/creatinine ratio 31; CMP normal except for glucose 181  Labs 06/29/15. HbA1c 9.6%  Labs 12/28/14: Hemoglobin A1c 9.5% today, compared with 9.5%, at last visit and with 9.9% at the prior visit.  Labs 08/03/14: HbA1c 9.5%; TSH 0.548,free T4 1.10, free T3 3.7, TPO antibody < 1, TSI 30; CMP normal except glucose 191; urinary microalbumin/creatinine ratio 27; cholesterol 260, triglycerides 90, HDL 51, LDL 191   Labs 10/05/13: HbA1c 9.9%  Labs 04/23/13: CMP normal, except glucose 144; cholesterol 216,  triglycerides 97, HDL 39, LDL 158; urinary microalbumin/creatinine ratio 6.9; TSH 0.505, free T4 1.17, free T3 3.8  Labs: 01/21/12: TSH 1.251, free T4 1.10, free T3 3.6, CBC normal, CMP normal except for glucose 206, urinary microalbumin/creatinine ratio 6.3, cholesterol 187, triglycerides 67, HDL 45, LDL 129   Assessment and Plan:   ASSESSMENT:  1. T1DM:   A. His BGs and HbA1c are a bit higher, but are doing quite well overall, considering the increased illness that he has been having for the past two months. He needs a slightly higher ICR at meals.   B. His BGs still vary with whether or not he has active  allergic rhinitis/sinusitis/bronchitis or other illnesses, whether or not he takes prednisone or other glucocorticoids, whether or not he feels well enough to exercise, and how long he keeps his sites in place. Almost all of his higher BGs in the past month were due to site changes, sensor changes, and higher carb intake than he sometimes recognized.   C. The combination of the Medtronic 670G pump and Guardian 3 sensor is again helping him achieve good BG control.   D. Dr. Vickey Hugerohmeier was worried that Diamox might cause erroneous BG or SG values. Thus far his SGs and BGs have correlated well.  2. Hypoglycemia: He has had no BGs <80.  3. Hypertension: SBP is good today, but his DBP is higher. He needs to continue to take meds regularly and to exercise regularly when he feels well enough to do so.  4. Combined hyperlipidemia: His lipids in March 2017 were too high, but the lipids in March 2018 were much better on atorvastatin. He discontinued the atorvastatin in mid-2018 due to concern that this medication might be causing his neuromuscular symptoms. We will plan to re-start the atorvastatin in the near future once he becomes stable on immunotherapy. We will repeat his lipid panel at that time.  5. Autonomic neuropathy with tachycardia and gastroparesis: His gastroparesis has improved somewhat. His heart rate has normalized. His BMs have also normalized. 6. Obesity: Weight has remained stable, but still too high. Eating Right and exercise will help. He needs fewer calories and more exercise.      7-8. Goiter/thyroiditis:   A. His thyroid gland is still somewhat enlarged, but smaller. His thyroiditis is clinically quiescent. The active thyroiditis that he has had in the past and the  process of waxing and waning of thyroid gland size are c/w evolving Hashimoto's thyroiditis.   B. He was euthyroid in March 2013, but was borderline hyperthyroid in June 2014. He was euthyroid in October 2015, March 2017, March  2018, July 2018, and March 2019.  9. Fatigue: This problem has worsened during his recent illness.   10. Obstructive sleep apnea: He did not meet the criteria for OSA, but because of his snoring a dental appliance was suggested. I recommended that he obtain a dental appliance. Unfortunately, his former dentist no longer accepts [employer] dental insurance, so Joseph BeachGabe has to find a new dentist. 11. Adult ADD: He had an appointment with Dr. Piedad Climesom Gualtieri, [place] in [place] in November 2018. Dr. Shane CrutchGualtieri is waiting for [name]'s medical situation to stabilize before beginning any new psych meds.   12. Proliferative diabetic retinopathy: This problem is due to his long period of poorly controlled BGs. The retinopathy was worse earlier in July 2019. It will take at least 6-12 months of near-normal BGs for his retinopathy to stabilize.  He will have follow up exam soon.  13. URI/nasal congestion/vertigo/allergic rhinitis and conjunctivitis: He has been ill for much of the past 2-3 months. His family wants him to go to the [facility] for a comprehensive evaluation. I concur.  14. Neuromuscular symptoms and headaches/increased intracranial pressure:   A. It appeared previously that some of [name]'s headaches were classic tension headaches. When he performed cervical stretching exercises the headache resolved.   B. In the past when Dr. Vickey Huger evaluated [name] for his vague neuromuscular symptoms, she did not find any signs of any serious neurologic disease. His MRI of his brain was unremarkable. At that time his neuromuscular symptoms had resolved.  C. Later, however, the headaches worsened and some of the neuromuscular symptoms recurred. We now know that he had increased ICP and that his headaches improved for a brief time after the LP. He then developed a typical spinal tap headache.  D. In retrospect he may have had intermittent problems with elevated ICP previously.  E. Dr. Vickey Huger suspects that [name]'s ICP was due to  the doxycycline. She will follow this issue over time.  15. Calcified lymph node: It is possible that he may have contracted a fungus while he was working in the [employer] several years ago. His thyroid US showed his goiter, but no intrinsic thyroid abnormality. A partially calcified cervical lymph node on the right was noted, c/w a post infections/inflammatory node. 16. Microalbuminuria: His ratio was mildly elevated in March 2019. He needed tighter BG and BP control.  PLAN:  1. Diagnostic:  Repeat HbA1c and CBG  today. Annual fasting surveillance labs prior to next visit.  2. Therapeutic: Follow DM care plan. Continue losartan. Change his current pump basal rates, ISFs, and BG targets.   Continue current ICRs and other pump settings:  MN: 7 8 AM: 5.00 -> 3.5 11 AM: 4.5 -> 3.5 9 PM: 7 3. Patient education:   A. We discussed the illness that he has had for the past two months.   We also discussed the need for him to continue to be followed closely by Drs Wylene Simmer, Kozlow, Dohmeier, Rankin, Hutchins, Fields, and [place].   B.  We discussed the fact that he has a relative who is an ENT physician in [place]. That relative advised him to go to the [facility] in [place], [place] to obtain a multidisciplinary evaluation of all of his problems to see if there might be one unifying cause that we have missed. I told him that it is possible that all of [place] could have missed a unifying cause, but that does not seem to be the case. However, going to [facility] is certainly something that might prove valuable to him. He will check with his medical insurance company to see if his insurance will cover such an evaluation.  4. Follow-up:  2 months  Level of Service: This visit lasted in excess of 70 minutes. More than 50% of the visit was devoted to counseling.  Molli Knock, MD, CDE Adult and Pediatric Endocrinology  10/28/2018 10:53 AM

## 2018-10-28 NOTE — Patient Instructions (Signed)
Follow up visit in 2 months. Please repeat fasting lab tests about one week prior.

## 2018-11-02 ENCOUNTER — Other Ambulatory Visit: Payer: Self-pay | Admitting: Neurology

## 2018-11-05 ENCOUNTER — Other Ambulatory Visit (INDEPENDENT_AMBULATORY_CARE_PROVIDER_SITE_OTHER): Payer: Self-pay | Admitting: *Deleted

## 2018-11-05 ENCOUNTER — Telehealth (INDEPENDENT_AMBULATORY_CARE_PROVIDER_SITE_OTHER): Payer: Self-pay | Admitting: "Endocrinology

## 2018-11-05 MED ORDER — LOSARTAN POTASSIUM 50 MG PO TABS: ORAL_TABLET | ORAL | 1 refills | Status: DC

## 2018-11-05 NOTE — Telephone Encounter (Signed)
Spoke to patient, requested script sent. encouraged him to use Mychart going forward for his needs.

## 2018-11-05 NOTE — Telephone Encounter (Signed)
°  Who's calling (name and relationship to patient) : Doye Geraci contact number:  [phone]  Provider they see: Dr Fransico Michael   Reason for call: Refill     PRESCRIPTION REFILL ONLY  Name of prescription: Losartan 50MG    Pharmacy: [facility]

## 2018-11-09 ENCOUNTER — Ambulatory Visit: Payer: 59

## 2018-11-09 ENCOUNTER — Encounter: Payer: Self-pay | Admitting: Neurology

## 2018-11-09 LAB — HM DIABETES EYE EXAM

## 2018-11-10 ENCOUNTER — Ambulatory Visit (INDEPENDENT_AMBULATORY_CARE_PROVIDER_SITE_OTHER): Payer: 59 | Admitting: *Deleted

## 2018-11-10 ENCOUNTER — Telehealth: Payer: Self-pay | Admitting: *Deleted

## 2018-11-10 DIAGNOSIS — J4550 Severe persistent asthma, uncomplicated: Secondary | ICD-10-CM | POA: Diagnosis not present

## 2018-11-10 NOTE — Telephone Encounter (Signed)
Patient is feeling better and would like to restart his allergy injections. Patient's last injection was on 08/19/18 and he received 0.50 of his first Red vial.

## 2018-11-10 NOTE — Telephone Encounter (Signed)
Noted on flow sheet.

## 2018-11-10 NOTE — Telephone Encounter (Signed)
Would recommend that he start at 0.05 (not 0.5) RED VIAL

## 2018-11-17 ENCOUNTER — Ambulatory Visit (INDEPENDENT_AMBULATORY_CARE_PROVIDER_SITE_OTHER): Payer: 59

## 2018-11-17 DIAGNOSIS — J309 Allergic rhinitis, unspecified: Secondary | ICD-10-CM | POA: Diagnosis not present

## 2018-11-29 ENCOUNTER — Ambulatory Visit: Payer: 59 | Admitting: Allergy and Immunology

## 2018-11-29 ENCOUNTER — Encounter: Payer: Self-pay | Admitting: Allergy and Immunology

## 2018-11-29 VITALS — BP 142/90 | HR 108 | Temp 98.3°F | Resp 20

## 2018-11-29 DIAGNOSIS — J4550 Severe persistent asthma, uncomplicated: Secondary | ICD-10-CM

## 2018-11-29 DIAGNOSIS — J3089 Other allergic rhinitis: Secondary | ICD-10-CM

## 2018-11-29 DIAGNOSIS — T502X5D Adverse effect of carbonic-anhydrase inhibitors, benzothiadiazides and other diuretics, subsequent encounter: Secondary | ICD-10-CM

## 2018-11-29 DIAGNOSIS — B370 Candidal stomatitis: Secondary | ICD-10-CM | POA: Diagnosis not present

## 2018-11-29 DIAGNOSIS — K219 Gastro-esophageal reflux disease without esophagitis: Secondary | ICD-10-CM | POA: Diagnosis not present

## 2018-11-29 NOTE — Patient Instructions (Addendum)
  1. Continue immunotherapy and Auvi-Q  2. Continue to Treat and prevent inflammation:   A. Asmanex 200 -2 puffs 2 times a day with a spacer   B. montelukast 10 mg tablet once a day  C. Budesonide 0.5 mg in 100 ml nasal saline 1-7 times per week   D. Mepolizumab every 4 weeks   3. Treat reflux:   A.  Omeprazole OTC mg tablet 1 time per day  4. Continue nystatin oral solution 5 mL swish and swallow every day    5. If needed:   A. nasal saline / wash  B. OTC antihistamine  C. Proair HFA / Xopenex 2 inhalations or albuterol every 4-6 hours  6. Use Rebreathing technique if needed  7. Blood - CMP, CBC w/diff + U/A  8. Further treatment? Antibiotics?   9. Return in 12 weeks or earlier if problem

## 2018-11-29 NOTE — Progress Notes (Signed)
Follow-up Note  Referring Provider: Lucila MaineSpencer, [name], PA-C Primary Provider: Lucila MaineSpencer, [name], PA-C Date of Office Visit: 11/29/2018  Subjective:   [name] (DOB: 01/24/1981) is a 38 y.o. male who [facility] in re-evaluation of the following:  HPI: [name] presents to this clinic in reevaluation of his asthma and allergic rhinitis and reflux and history of recurrent infections.  I last saw him in this clinic on 05 October 2018 at which point in time he was utilizing a large collection of anti-inflammatory agents for his airway including the use of mepolizumab injections and also continued to treat reflux.  When I last saw him in this clinic he just had resolved a rather significant and prolonged event with his respiratory tract with multiple infections requiring antibiotics and systemic steroids.  We did screen him for a worrisome systemic disease including an ANCA screen and all of his test came back normal.  Overall he was doing relatively well with his respiratory tract and rarely used a short acting bronchodilator.  Unfortunately, last Tuesday he started to develop chest discomfort described as a "chest pain"" in his sternal region along with some slight cough and may be some mucus.  He then became quite short of breath and has been short of breath ever since.  He has also had some issues with return of his vertigo this weekend.  He has also been having a little bit more problems with his nausea.  He thinks that some of the mucous that he is coughing up might have some green tinge.  He still continues to have a problem with a nosebleed on occasion.  He tried a short acting bronchodilator this week and it really did not help any of these issues.  He has continued to utilize Asmanex and montelukast on a regular basis and has continued to use daily nystatin which is resulting in very good control of the problems he has had with thrush.  His  nasal budesonide washes are about 1 time per week and he uses nasal saline the other times of the week.  It should be noted that he continues to use acetazolamide for his pseudotumor cerebri.  Allergies as of 11/29/2018      Reactions   Flonase [fluticasone] Shortness Of Breath   Sulfites Itching, Swelling   Molds & Smuts    Tetracyclines & Related Other (See Comments)   increased intracranial pressure      Medication List      acetaZOLAMIDE 500 MG capsule Commonly known as:  DIAMOX TAKE 2 CAPSULES BY MOUTH IN THE MORNING AND 1 CAPSULE IN THE EVENING   albuterol 108 (90 Base) MCG/ACT inhaler Commonly known as:  PROAIR HFA Inhale 2 puffs into the lungs every 6 (six) hours as needed for wheezing or shortness of breath.   albuterol (2.5 MG/3ML) 0.083% nebulizer solution Commonly known as:  PROVENTIL Take 3 mLs (2.5 mg total) by nebulization every 4 (four) hours as needed for wheezing or shortness of breath.   ASMANEX HFA 200 MCG/ACT Aero Generic drug:  Mometasone Furoate INHALE 2 PUFFS INTO THE LUNGS TWICE DAILY   atorvastatin 20 MG tablet Commonly known as:  LIPITOR   AUVI-Q 0.3 mg/0.3 mL Soaj injection Generic drug:  EPINEPHrine Use as directed for life-threatening allergic reaction.   budesonide 0.5 MG/2ML nebulizer solution Commonly known as:  PULMICORT Take 2 mLs (0.5 mg total) by nebulization as directed.   budesonide-formoterol 160-4.5  MCG/ACT inhaler Commonly known as:  SYMBICORT Inhale 2 puffs into the lungs 2 (two) times daily.   clonazePAM 0.5 MG tablet Commonly known as:  KLONOPIN Take 0.5 mg by mouth 2 (two) times daily as needed.   glucagon 1 MG injection Follow package directions for low blood sugar.   HUMALOG 100 UNIT/ML injection Generic drug:  insulin lispro INJECT 300 UNITS UNDER THE SKIN VIA INSULIN PUMP EVERY 48-72 HOURS PER HYPERGLYCEMIA AND DKA PROTOCOLS   losartan 50 MG tablet Commonly known as:  COZAAR TAKE 1 TABLET(50 MG) BY MOUTH  DAILY   montelukast 10 MG tablet Commonly known as:  SINGULAIR TAKE 1 TABLET(10 MG) BY MOUTH AT BEDTIME   nystatin 100000 UNIT/ML suspension Commonly known as:  MYCOSTATIN Swish and swallow 5 mls daily   omeprazole 20 MG capsule Commonly known as:  PRILOSEC Take 20 mg by mouth daily.   ONE TOUCH ULTRA TEST test strip Generic drug:  glucose blood USE TO CHECK BLOOD SUGAR SIX TIMES DAILY   TYLENOL 500 MG tablet Generic drug:  acetaminophen Take 1,000 mg by mouth 2 (two) times daily as needed.       Past Medical History:  Diagnosis Date  . ADHD (attention deficit hyperactivity disorder)   . Autonomic neuropathy due to diabetes (HCC)   . Diabetes (HCC)   . Fatigue   . Goiter   . High blood pressure   . High cholesterol   . Hypercholesterolemia   . Hypertension   . Hypoglycemia associated with diabetes (HCC)   . Obesity   . Tachycardia   . Type 1 diabetes mellitus not at goal [facility](HCC)   . Uncontrolled DM with microalbuminuria or microproteinuria     Past Surgical History:  Procedure Laterality Date  . REFRACTIVE SURGERY     x9  . WISDOM TOOTH EXTRACTION      Review of systems negative except as noted in HPI / PMHx or noted below:  Review of Systems  Constitutional: Negative.   HENT: Negative.   Eyes: Negative.   Respiratory: Negative.   Cardiovascular: Negative.   Gastrointestinal: Negative.   Genitourinary: Negative.   Musculoskeletal: Negative.   Skin: Negative.   Neurological: Negative.   Endo/Heme/Allergies: Negative.   Psychiatric/Behavioral: Negative.      Objective:   Vitals:   11/29/18 1528 11/29/18 1618  BP: (!) 120/92 (!) 142/90  Pulse: (!) 108   Resp: 20   Temp: 98.3 F (36.8 C)   SpO2: 98%           Physical Exam Constitutional:      Appearance: He is not diaphoretic.     Comments: Obviously tachypneic but able to complete sentences without any difficulty.  HENT:     Head: Normocephalic.     Right Ear: Tympanic membrane, ear  canal and external ear normal.     Left Ear: Tympanic membrane, ear canal and external ear normal.     Nose: Nose normal. No mucosal edema or rhinorrhea.     Mouth/Throat:     Pharynx: Uvula midline. No oropharyngeal exudate.  Eyes:     Conjunctiva/sclera: Conjunctivae normal.  Neck:     Thyroid: No thyromegaly.     Trachea: Trachea normal. No tracheal tenderness or tracheal deviation.  Cardiovascular:     Rate and Rhythm: Normal rate and regular rhythm.     Heart sounds: Normal heart sounds, S1 normal and S2 normal. No murmur.  Pulmonary:     Effort: No respiratory distress.  Breath sounds: Normal breath sounds. No stridor. No wheezing or rales.  Lymphadenopathy:     Head:     Right side of head: No tonsillar adenopathy.     Left side of head: No tonsillar adenopathy.     Cervical: No cervical adenopathy.  Skin:    Findings: No erythema or rash.     Nails: There is no clubbing.   Neurological:     Mental Status: He is alert.     Diagnostics:    Spirometry was performed and demonstrated an FEV1 of 2.23 at 52 % of predicted.  [name] was instructed to perform a rebreathing technique with a paper bag and within 5 minutes his respiratory rate decreased by 50% and he felt much better in general with resolution of his shortness of breath..  Assessment and Plan:   1. Not well controlled severe persistent asthma   2. Perennial allergic rhinitis   3. Gastroesophageal reflux disease, esophagitis presence not specified   4. Oral candidiasis   5. Acetazolamide causing adverse effect in therapeutic use, subsequent encounter     1. Continue immunotherapy and Auvi-Q  2. Continue to Treat and prevent inflammation:   A. Asmanex 200 -2 puffs 2 times a day with a spacer   B. montelukast 10 mg tablet once a day  C. Budesonide 0.5 mg in 100 ml nasal saline 1-7 times per week   D. Mepolizumab every 4 weeks   3. Treat reflux:   A.  Omeprazole OTC mg tablet 1 time per day  4.  Continue nystatin oral solution 5 mL swish and swallow every day    5. If needed:   A. nasal saline / wash  B. OTC antihistamine  C. Proair HFA / Xopenex 2 inhalations or albuterol every 4-6 hours  6. Use Rebreathing technique if needed  7. Blood - CMP, CBC w/diff + U/A  8. Further treatment? Antibiotics?   9. Return in 12 weeks or earlier if problem   Without a doubt Joseph Beach is having problems with tachypnea which is probably a function of hyperventilation from his acetazolamide.  There very well could be a slight viral respiratory tract infection but the overwhelming appearance today is one of hyperventilation.  I am not going to have him utilize any steroids or antibiotics for treating an infection at this point as he appears to have a viral infection only but I would like to further investigate whether or not we are in a very acidic state by screening his blood and urine with the tests noted above.  If it does look like acetazolamide is creating significant problems then he will need to make some tough choices about how to address his pseudotumor cerebri as apparently this condition was threatening his vision.  In retrospect when looking back through his respiratory tract problems they really did increase in intensity and frequency since starting his acetazolamide.  Laurette Schimke, MD Allergy / Immunology [facility]

## 2018-11-30 ENCOUNTER — Encounter: Payer: Self-pay | Admitting: Allergy and Immunology

## 2018-11-30 LAB — COMPREHENSIVE METABOLIC PANEL WITH GFR
ALT: 24 IU/L (ref 0–44)
AST: 15 IU/L (ref 0–40)
Albumin/Globulin Ratio: 1.9 (ref 1.2–2.2)
Albumin: 4.7 g/dL (ref 4.0–5.0)
Alkaline Phosphatase: 107 IU/L (ref 39–117)
BUN/Creatinine Ratio: 14 (ref 9–20)
BUN: 14 mg/dL (ref 6–20)
Bilirubin Total: 0.4 mg/dL (ref 0.0–1.2)
CO2: 17 mmol/L — ABNORMAL LOW (ref 20–29)
Calcium: 9.7 mg/dL (ref 8.7–10.2)
Chloride: 109 mmol/L — ABNORMAL HIGH (ref 96–106)
Creatinine, Ser: 1.02 mg/dL (ref 0.76–1.27)
GFR calc Af Amer: 109 mL/min/1.73
GFR calc non Af Amer: 94 mL/min/1.73
Globulin, Total: 2.5 g/dL (ref 1.5–4.5)
Glucose: 198 mg/dL — ABNORMAL HIGH (ref 65–99)
Potassium: 4.2 mmol/L (ref 3.5–5.2)
Sodium: 142 mmol/L (ref 134–144)
Total Protein: 7.2 g/dL (ref 6.0–8.5)

## 2018-11-30 LAB — URINALYSIS
Bilirubin, UA: NEGATIVE
Glucose, UA: NEGATIVE
Ketones, UA: NEGATIVE
LEUKOCYTES UA: NEGATIVE
Nitrite, UA: NEGATIVE
RBC UA: NEGATIVE
SPEC GRAV UA: 1.029 (ref 1.005–1.030)
Urobilinogen, Ur: 1 mg/dL (ref 0.2–1.0)
pH, UA: 5.5 (ref 5.0–7.5)

## 2018-11-30 LAB — CBC WITH DIFFERENTIAL/PLATELET
Basophils Absolute: 0.1 x10E3/uL (ref 0.0–0.2)
Basos: 1 %
EOS (ABSOLUTE): 0 x10E3/uL (ref 0.0–0.4)
Eos: 0 %
Hematocrit: 50.9 % (ref 37.5–51.0)
Hemoglobin: 17.4 g/dL (ref 13.0–17.7)
Immature Grans (Abs): 0 x10E3/uL (ref 0.0–0.1)
Immature Granulocytes: 0 %
Lymphocytes Absolute: 1.8 x10E3/uL (ref 0.7–3.1)
Lymphs: 16 %
MCH: 30.2 pg (ref 26.6–33.0)
MCHC: 34.2 g/dL (ref 31.5–35.7)
MCV: 88 fL (ref 79–97)
Monocytes Absolute: 1 x10E3/uL — ABNORMAL HIGH (ref 0.1–0.9)
Monocytes: 9 %
Neutrophils Absolute: 8.6 x10E3/uL — ABNORMAL HIGH (ref 1.4–7.0)
Neutrophils: 74 %
Platelets: 330 x10E3/uL (ref 150–450)
RBC: 5.76 x10E6/uL (ref 4.14–5.80)
RDW: 12.7 % (ref 11.6–15.4)
WBC: 11.6 x10E3/uL — ABNORMAL HIGH (ref 3.4–10.8)

## 2018-11-30 NOTE — Progress Notes (Signed)
Exp 12/02/19 

## 2018-12-01 DIAGNOSIS — J301 Allergic rhinitis due to pollen: Secondary | ICD-10-CM

## 2018-12-08 ENCOUNTER — Ambulatory Visit (INDEPENDENT_AMBULATORY_CARE_PROVIDER_SITE_OTHER): Payer: 59 | Admitting: *Deleted

## 2018-12-08 DIAGNOSIS — J4550 Severe persistent asthma, uncomplicated: Secondary | ICD-10-CM | POA: Diagnosis not present

## 2018-12-15 ENCOUNTER — Ambulatory Visit: Payer: 59 | Admitting: Neurology

## 2018-12-15 ENCOUNTER — Encounter: Payer: Self-pay | Admitting: Neurology

## 2018-12-15 ENCOUNTER — Telehealth: Payer: Self-pay | Admitting: Neurology

## 2018-12-15 VITALS — BP 118/76 | HR 96 | Ht 72.0 in | Wt 260.0 lb

## 2018-12-15 DIAGNOSIS — E103593 Type 1 diabetes mellitus with proliferative diabetic retinopathy without macular edema, bilateral: Secondary | ICD-10-CM | POA: Diagnosis not present

## 2018-12-15 DIAGNOSIS — J4551 Severe persistent asthma with (acute) exacerbation: Secondary | ICD-10-CM

## 2018-12-15 DIAGNOSIS — G912 (Idiopathic) normal pressure hydrocephalus: Secondary | ICD-10-CM

## 2018-12-15 MED ORDER — ACETAZOLAMIDE ER 500 MG PO CP12: ORAL_CAPSULE | ORAL | 1 refills | Status: DC

## 2018-12-15 NOTE — Progress Notes (Signed)
SLEEP MEDICINE CLINIC   Provider:  Melvyn Novas, MD  Primary Care Physician:  Lucila Maine, Guerry Bruin, MD    Referring Provider: ophthalmologist Dr. Luciana Axe, who is also a retina specialist.   The patient is further followed by endocrinologist Dr. Molli Knock since he is diabetic type I.   Chief Complaint  Patient presents with  . Follow-up    pt with mom, rm 12. pt states that he has been having a lot of breathing complications and he has followed with a allergy and asthma specialist who thinks that after completing lab work the diamox could be causing some of the problems with his breathing. wanted to discuss that.    12-15-2018, I see [name] here today with his mother- he has retro-orbital pain at times, more often in AM- and he has had many more asthma attacks, culminating in chest pain and presentation to ED-I is supposed to have MAB infusions for allergy control but was too sick to have the regular infusion one in 30 days- Has been to sick to get the weekly immunotherapy.  DM follow up documented fair control of glucose.    Just seen by allergy/ asthma physician Dr Lucie Leather on 11-29-2018;  .  I quote: " If it does look like acetazolamide is creating significant problems then he will need to make some tough choices about how to address his pseudotumor cerebri as apparently this condition was threatening his vision.  In retrospect when looking back through his respiratory tract problems they really did increase in intensity and frequency since starting his acetazolamide." We decided to reduce the diamox to daily 250 mg and if asthma is still unabashed , will need to switch to TPM and repeat CSF release.   06-09-2018, I have the pleasure of meeting with Kathreen Devoid.  Bartnik today, he recently had seen Dr. Alveda Reasons dental office and underwent a home sleep test they are which has the same results essentially that he had here in an attended polysomnography from 18 June 2017.   Prior to our test he had a home sleep test at [facility] and was told based on those results that he did have obstructive sleep apnea which I could not confirm.  Neither could Dr. Alveda Reasons home sleep test.  The patient however is a loud snorer and our concern was that the dental device may help him to reduce or treat the snoring.  The dental referral was not based on the presence of obstructive sleep apnea.  He also qualified that the sleep study from Dr. Alveda Reasons office on 24 May 2018 was performed while he had an upper respiratory infection, could not rest and breathes well at baseline and could especially not rest flat and needed several pillows to support him.  He did not have tachybradycardia arrhythmias, he did not have oxygen desaturations of significance, so we are left with discussing how to treat the snoring. insurance will not pay for snoring based dental device.   Robbie Louis is also not excessively daytime sleepy and endorsed the Epworth Sleepiness Scale at 8 points, the  fatigue severity score at 58/ 63  points. He is excessively fatigued and this may relate to his DM type 1. He no longer has symptoms of pseudotumor on diamox, has to drink more not to dehydrate. He used to see neon green while he has optic nerve pressure- red vision was unimpaired. He no longer has the green ray in his visual field.  Much less tinnitus  on higher diamox dose. Here for refills.       Interval history from 25 Mar 2018, Mr. Trevel Dillenbeck is here today for follow-up visit, he has tolerated the LP very well, his opening pressure was elevated he was asked to discontinue the tetracycline medication and instead started on Diamox.  He reports that both headaches and vision have been much improved, sometimes in the morning he still has a hazy greenish tinge to his visual field the dose away once he takes his medication.  Much less frequent has since been observed in the afternoon or evenings.  He has not to take  another antibiotic as his urine sample to look clear and his urologist felt that he was not in need of further therapy. Dr Fransico Michael checked his glucometer readings against his pump, and they tracked well. He plans to travel by plane soon and had additional questions about the ICP and diamox. No longer ptosis on the right - resolved right after LP. He has not returned to the gym- where he noted both ptosis and greenish vision.  He was sick after a trip to [place]- explained by altitude exacerbation.  Sleep apnea    Avoiding tetracycline, retinoid, high dose vit A . Avoid high altitudes without diamox premedication.   use diamox defore you fly .     03-12-2018,   I have the pleasure of seeing Mr. Raed Schalk today in a follow-up visit.  I had seen the patient on 14 May when he presented with very concerning data from his ophthalmologist, retro-orbital pressure was identified, and he was from here referred after physical and neurological exam for an immediate fluoroscopic lumbar puncture.  This was performed at 1 PM at [facility] imaging, no he was [facility] hospital location, a successful nontraumatic spinal tap was obtained, between the second and third lumbar vertebra opening pressure was 28 cmH2O.  Except for an elevated glucose level in the CSF there was no other abnormality, no elevated protein no white blood cells or red cells and all cultures thus far have been negative.  After the spinal tap developed abdominal almost spasms, as well as cramping at the chest wall muscles.  By day 3 post spinal tap he has developed typical spinal tap headaches that arise when he sits up or walks sometimes needing about 10 to 15 minutes.  He feels much better laying flat and laying reclining.  In order to treat the spinal tap headaches I will send him for a blood patch, urinary tract infection was ruled out his urinary sample to clean I was concerned since I discontinued his antibiotics.  I still think that the  tetracyclines probably tipped the intracranial pressure.  He saw Dr. Luciana Axe his retinal specialist yesterday, they were pleased with the results as to his retinal changes, any further procedure from ophthalmology will be pending until gait feels better has less headaches and discomfort.  I also started Diamox at 500 mg twice daily should he developed gastrointestinal side effects from this medication I would like in that case to reduce it to 250 in the morning 250 at night and we can still advance the dose later on after a couple of weeks or so.    03-09-2018, Patient returned for an urgent visit based on vision changes and diabetic changes to the eye, seen by Dr. Luciana Axe.  He developed headaches , changes to green tinged vision, some tremor and HA are worse when he in bed, better when he rises. Dynamic intracranial  pressure changes He gets nauseated, and worse vision changes when moving. He feels a vibration and tension in the neck and pressure behind the right eye more than left, he sleeps on his right. History of chronic sinusitis, with tinnitus since 10/ 2017 . He tested negative for OSA. He had initially improved in HA, tinnitus and Vertigo. Tremor could not be objectively seen.  He has been a diabetic Type 1, followed by Dr.  Molli Knock, with an insulin pump.  MRI brain and sinus CT in the last 6 month. Seen by ENT and allergy specialist.  His upper respiratory infects have decreased, since allergy treatment.   Topical vancomycin and extensive notes over, the last visit with the patient was on May 9 Thursday 2019, the patient had been treated with Avastin injections into the left and right eye, beginning in August 2018 the last right eye injection was on November 09, 2017.  Patient is currently on doxycycline, Arnuity, Humalog insulin, Klonopin, losartan and montelukast.  Impression was that the patient has controlled diabetes type 1 diabetes mellitus with right eye affected by proliferative  retinopathy and macular edema Dr. Luciana Axe documented that he felt this was worsening.  He also stated the same for the left eye also to a slightly lesser degree.  There is also a minor cataract forming in both eyes, there is optic papillitis which has not been graded, ocular pain, and a history of acute sinusitis and retinal hemorrhage he mentions the patient snores and has headaches with valsalva-.  Transient obscuration of vision in both eyes- last  MRI scan was normal at the time/ 2018.   Headaches much worse when bending over, body habitus and doxycycline medication may cause this patient's likely intracranial pressure elevation.  ENT cleared him of sinusitis: CLINICAL DATA:  Chronic sinusitis   08-31-2017, Mr. Lavaughn Rung is seen here today for a follow-up visit after his sleep study from 06/18/2017.  The patient again had such a mild degree of apnea that it does not need to be  treated with any intervention.  His AHI was only 2.6/h not considered pathologically.  His REM sleep AHI was 14.3/h and supine AHI 3.4.  We meet today to also look at his sleep experience since the study.  The patient reports that he still wakes up with trembling in both arms, he has shoulder discomfort which keeps him from sleeping on his side.  However in intervention with CPAP, PLM medication or oxygen is not necessary at this point.  I do not have a good understanding as to why he trembles, and why these trembling spells wake him from sleep.  He has been followed closely for his thyroid function and reports that it has been in the high normal range.  He still reports some palpitations that he experiences at night.  He also reports facial twitching at night. He related the onset to psychiatric medication ( wellbutrin, trazodone). He has year around asthma and allergies.  I have no interventional therapies available.    CONSULT : SLEEP CLINIC:  Jaeger Trueheart is a 38 y.o. male , seen here as in a referral/ revisit  from  sports medicine and Dr Fransico Michael, ped endocrinologist.  I would like to thank Dr. Fransico Michael for his report notes which helped me today to design further sleep evaluation and treatment for this patient. He also gave me an excellent summary of the past medical problems that Mr. Ferraris has faced. Mr.  Streight was diagnosed with diabetes at age  19 and has been followed by a pediatric endocrinologist, Dr. Fransico Michael. His blood sugars have been better and more stable on an auto mode Medtronic insulin pump. He also faces problems with obesity, hypertension, hyperlipidemia and an autonomic neuropathy with tachycardia and gastroparesis. As blood pressure control had improved so did his heart rate and his postprandial bloating. He has been treated for thyroiditis consistent with an evolving Hashimoto's ultra immune thyroid disease. He was euthyroid again in March 2018 when last checked he had reported fatigue and was diagnosed by a home sleep test last year at " [facility]", was  supposedly diagnosed with obstructive sleep apnea but had not treatment initiated .  He has proliferative retinopathy, attributed to diabetes. Dr. Fransico Michael also wanted to make sure that current rather newish neuromuscular symptoms beI evaluated -he was especially concerned that this patient is prone to further auto-immune diseases such as central demyelination or multiple sclerosis. Dr. Darrick Penna in this sport medicine section of [facility] had added in this visit on 04/15/2007 but the patient habits and improvement of nausea, vertigo, after discontinuing eyedrops, trazodone and Wellbutrin and atorvastatin. He had more headaches. He felt jittery and tremors of his right hand and right leg were temporary worse. He describes photophobia. He felt shaking more in the morning l right hand and right leg, an inner jitteriness. It has improved. Vertigo not present today.    Sleep habits are as follows:  " Bedtime between 10 and 12 midnight, rises at 7 AM. He  describes his sleep is rather restless, trying to find a comfortable sleep position, but she doesn't necessarily use the bed during the night. He does not report nocturia, and only twice or so in the last month has he woke up with palpitations or clamminess, likely related to hypoglycemia. He is known to snore, and he had a home sleep test at [facility] last year in [facility] and was told that he has obstructive sleep apnea. Treatment was not initiated yet. He wakes up not feeling restored or refreshed, and he has noticed that his jittery illness affecting the right extremities and a feeling of inner trembling of his torso is the worst in the morning hours. It doesn't last that long. He also has a pulsating change of vision when he first gets up and he suffered from vertigo for much of the last 3 months. No daytime naps, he wouldn't sleep at night if he did.   Sleep medical history and family sleep history:  Dad snoring, mom is an insomniac. He remembers having problems sleeping even in childhood. He has felt fatigued and not restored for several months. Social history: works as Games developer. The patient is single, no children. No tobacco use, seldom alcohol use,  4 months ago he stopped drinking caffeine due to the jitteriness, and headaches with photophobia and a feeling of the head being full could also be relating to sinusitis.    Review of Systems: Out of a complete 14 system review, the patient complains of only the following symptoms, and all other reviewed systems are negative.  Patient has shoulder pain and trembling in both arms, feels tight, achy. Not shooting pains, not electric sensation. Soreness in shoulder muscles, affecting sleep position, tinnitus improved, color vision normalized in Diamox.   Epworth score 8 Fatigue severity score 58/ 63 - Is higher than last visit  , depression score n/a    Social History   Socioeconomic History  . Marital status:  Single  Spouse name: Not on file  . Number of children: Not on file  . Years of education: Not on file  . Highest education level: Not on file  Occupational History  . Not on file  Social Needs  . Financial resource strain: Not on file  . Food insecurity:    Worry: Not on file    Inability: Not on file  . Transportation needs:    Medical: Not on file    Non-medical: Not on file  Tobacco Use  . Smoking status: Never Smoker  . Smokeless tobacco: Never Used  Substance and Sexual Activity  . Alcohol use: No  . Drug use: No  . Sexual activity: Not on file  Lifestyle  . Physical activity:    Days per week: Not on file    Minutes per session: Not on file  . Stress: Not on file  Relationships  . Social connections:    Talks on phone: Not on file    Gets together: Not on file    Attends religious service: Not on file    Active member of club or organization: Not on file    Attends meetings of clubs or organizations: Not on file    Relationship status: Not on file  . Intimate partner violence:    Fear of current or ex partner: Not on file    Emotionally abused: Not on file    Physically abused: Not on file    Forced sexual activity: Not on file  Other Topics Concern  . Not on file  Social History Narrative   ** Merged History Encounter **        Family History  Problem Relation Age of Onset  . Dementia Paternal Aunt   . Schizophrenia Paternal Aunt   . Cancer Maternal Grandmother   . Diabetes Maternal Grandmother        T2 DM  . Cancer Maternal Grandfather   . Heart disease Father   . Thyroid disease Neg Hx     Past Medical History:  Diagnosis Date  . ADHD (attention deficit hyperactivity disorder)   . Autonomic neuropathy due to diabetes (HCC)   . Diabetes (HCC)   . Fatigue   . Goiter   . High blood pressure   . High cholesterol   . Hypercholesterolemia   . Hypertension   . Hypoglycemia associated with diabetes (HCC)   . Obesity   . Tachycardia   .  Type 1 diabetes mellitus not at goal [facility])   . Uncontrolled DM with microalbuminuria or microproteinuria     Past Surgical History:  Procedure Laterality Date  . REFRACTIVE SURGERY     x9  . WISDOM TOOTH EXTRACTION      Current Outpatient Medications  Medication Sig Dispense Refill  . acetaminophen (TYLENOL) 500 MG tablet Take 1,000 mg by mouth 2 (two) times daily as needed.    [name] acetaZOLAMIDE (DIAMOX) 500 MG capsule TAKE 2 CAPSULES BY MOUTH IN THE MORNING AND 1 CAPSULE IN THE EVENING 90 capsule 1  . albuterol (PROAIR HFA) 108 (90 Base) MCG/ACT inhaler Inhale 2 puffs into the lungs every 6 (six) hours as needed for wheezing or shortness of breath. 1 Inhaler 2  . albuterol (PROVENTIL) (2.5 MG/3ML) 0.083% nebulizer solution Take 3 mLs (2.5 mg total) by nebulization every 4 (four) hours as needed for wheezing or shortness of breath. 75 mL 1  . ASMANEX HFA 200 MCG/ACT AERO INHALE 2 PUFFS INTO THE LUNGS TWICE DAILY 13 g 5  .  atorvastatin (LIPITOR) 20 MG tablet   0  . AUVI-Q 0.3 MG/0.3ML SOAJ injection Use as directed for life-threatening allergic reaction. 4 Device 2  . budesonide (PULMICORT) 0.5 MG/2ML nebulizer solution Take 2 mLs (0.5 mg total) by nebulization as directed. 2 mL 3  . budesonide-formoterol (SYMBICORT) 160-4.5 MCG/ACT inhaler Inhale 2 puffs into the lungs 2 (two) times daily. 1 Inhaler 5  . clonazePAM (KLONOPIN) 0.5 MG tablet Take 0.5 mg by mouth 2 (two) times daily as needed.  0  . glucagon 1 MG injection Follow package directions for low blood sugar. 1 each 3  . HUMALOG 100 UNIT/ML injection INJECT 300 UNITS UNDER THE SKIN VIA INSULIN PUMP EVERY 48-72 HOURS PER HYPERGLYCEMIA AND DKA PROTOCOLS 40 mL 5  . losartan (COZAAR) 50 MG tablet TAKE 1 TABLET(50 MG) BY MOUTH DAILY 90 tablet 1  . montelukast (SINGULAIR) 10 MG tablet TAKE 1 TABLET(10 MG) BY MOUTH AT BEDTIME 30 tablet 5  . nystatin (MYCOSTATIN) 100000 UNIT/ML suspension Swish and swallow 5 mls daily 150 mL 2  . omeprazole  (PRILOSEC) 20 MG capsule Take 20 mg by mouth daily.    . ONE TOUCH ULTRA TEST test strip USE TO CHECK BLOOD SUGAR SIX TIMES DAILY 600 each 1   Current Facility-Administered Medications  Medication Dose Route Frequency Provider Last Rate Last Dose  . Mepolizumab SOLR 100 mg  100 mg Subcutaneous Q28 days Alfonse Spruce, MD   100 mg at 12/08/18 1614   Facility-Administered Medications Ordered in Other Visits  Medication Dose Route Frequency Provider Last Rate Last Dose  . gadopentetate dimeglumine (MAGNEVIST) injection 20 mL  20 mL Intravenous Once PRN Florence Yeung, Porfirio Mylar, MD        Allergies as of 12/15/2018 - Review Complete 12/15/2018  Allergen Reaction Noted  . Flonase [fluticasone] Shortness Of Breath 06/10/2017  . Sulfites Itching and Swelling 02/04/2018  . Molds & smuts  03/09/2018  . Tetracyclines & related Other (See Comments) 03/09/2018    Vitals: BP 118/76   Pulse 96   Ht 6' (1.829 m)   Wt 260 lb (117.9 kg)   BMI 35.26 kg/m  Last Weight:  Wt Readings from Last 1 Encounters:  12/15/18 260 lb (117.9 kg)   HRC:BULA mass index is 35.26 kg/m.     Last Height:   Ht Readings from Last 1 Encounters:  12/15/18 6' (1.829 m)    Physical exam:  General: The patient is awake, alert and appears not in acute distress. The patient is well groomed. Head: Normocephalic, atraumatic. Neck is supple. Mallampati 5-  ,  neck circumference:21. Nasal airflow patent , TMJ is  Not  evident . Retrognathia is not seen. Full facial hair.  Cardiovascular:  Regular rate and rhythm , without  murmurs or carotid bruit, and without distended neck veins. Respiratory:  Voice is hoarse, used inhaler right before visit. He has wheezing and had rhonchi., has been on steroids, antibiotics.  Skin:  Without evidence of edema, or rash Trunk: BMI is elevated. The patient's posture is erect   Neurologic exam : The patient is awake and alert, oriented to place and time.   Memory subjective described as  intact.  Attention span & concentration ability appears normal.  Speech is fluent, without dysarthria, dysphonia or aphasia.  Mood and affect are appropriate.  Cranial nerves: Pupils are equal and briskly reactive to light. Pale retinae bilaterally, yellow tinged - has neovascular proliferation in both eyes. Laser surgical scars visible. Extraocular movements in vertical and horizontal  planes  without nystagmus.  Hearing to finger rub intact. Facial sensation intact to fine touch. Facial motor  symmetric and tongue / uvula move midline. Shoulder shrug was symmetrical.  Motor exam:  Normal tone, muscle bulk and symmetric strength in all extremities. He has less tension over the neck, worked with PT.  Sensory:  Fine touch, pinprick normal in feet , but he has less often a feeling of vibration in feet and hands. Coordination: Rapid alternating movements normal without evidence of ataxia, dysmetria or tremor. He is restless NO TREMOR seen here today . Gait and station: Patient walks without assistive device . Strength within normal limits.  Stance is stable and normal.  Turns with 3 steps.   Deep tendon reflexes: in the upper and lower extremities are symmetric and intact. Babinski maneuver response is  downgoing.  Assessment:  After physical and neurologic examination, review of laboratory studies,  Personal review of imaging studies, reports of other /same  Imaging studies, results of polysomnography and / or neurophysiology testing and pre-existing records as far as provided in visit., my assessment is   1)  intracranial hypertension in a young brittle diabetic on tetracycline ( one of the most ICP producing medications) confirmed by LP. Now on diamox having more frequent asthma attacks- causality unclear , but time correlated.    2)  Will reduce Diamox and have [name] keep a journal of asthma/ headaches.    3) his sleep evaluation has been completed, no apnea, mild PLMs and no hypoxemia. Snoring  was mild- moderate. No need for CPAP, he may pursue a dental device.  4) He has breathing difficulties from allergic asthma, orthopnea chronic and acute superimposed, grade 2.  HST without hypoxemia, even while having asthma.     The patient was advised of the nature of the diagnosed disorder , the treatment options and the risks for general health and wellness arising from not treating the condition.I spent more than 35  minutes of face to face time with the patient.  Greater than 50% of time was spent in counseling and coordination of care. We have discussed the diagnosis and differential and I answered the patient's questions.    Plan:  Treatment plan and additional workup :   1)Refilled diamox at a lower dose since he started last April 2018 on higher doses and had more asthmatic problems since.  I reduced diamox from 100 mg in AM and 500 mg pm, to 500 mg bid po.   2)Keep a journal of HA and asthma.  I will ask Dr. Dione BoozeGroat for his last reports- swelling had declined , optic nerve looked less swollen upon last visit.   3)Repeat CT head in preparation for CSF , LP with opening pressure. Labs. [name] reported greenish vision and sparkles in the right eyes vision.  Will need to see opening pressure- may need to switch to TPM.     I hope Joseph BeachGabe can find relief from his respiratory problems, and we can then ask for a dental device for snoring.  RV 10- 12 weeks with me, MD    Melvyn NovasARMEN Fia Hebert, MD 12/15/2018, 10:55 AM  Certified in Neurology by ABPN Certified in Sleep Medicine by [facility]  [facility] [address], [address] [place], [place] [id number]

## 2018-12-15 NOTE — Telephone Encounter (Signed)
[facility] called informing us that the pt needs to be scheduled in the hospital for the CT HEAD WO CM CHARM STUDY due to the fact that they can not do it at GI. Please advise.

## 2018-12-15 NOTE — Patient Instructions (Signed)
Acetazolamide tablets What is this medicine? ACETAZOLAMIDE (a set a ZOLE a mide) is used to treat glaucoma and some seizure disorders. It may be used to treat edema or swelling from heart failure or from other medicines. This medicine is also used to treat and to prevent altitude or mountain sickness. This medicine may be used for other purposes; ask your health care provider or pharmacist if you have questions. COMMON BRAND NAME(S): Diamox What should I tell my health care provider before I take this medicine? They need to know if you have any of these conditions: -diabetes -kidney disease -liver disease -lung disease -an unusual or allergic reaction to acetazolamide, sulfa drugs, other medicines, foods, dyes, or preservatives -pregnant or trying to get pregnant -breast-feeding How should I use this medicine? Take this medicine by mouth with a glass of water. Follow the directions on the prescription label. Take this medicine with food if it upsets your stomach. Take your doses at regular intervals. Do not take your medicine more often than directed. Do not stop taking except on your doctor's advice. Talk to your pediatrician regarding the use of this medicine in children. Special care may be needed. Patients over 21 years old may have a stronger reaction and need a smaller dose. Overdosage: If you think you have taken too much of this medicine contact a poison control center or emergency room at once. NOTE: This medicine is only for you. Do not share this medicine with others. What if I miss a dose? If you miss a dose, take it as soon as you can. If it is almost time for your next dose, take only that dose. Do not take double or extra doses. What may interact with this medicine? Do not take this medicine with any of the following medications: -methazolamide This medicine may also interact with the following medications: -aspirin and aspirin-like  medicines -cyclosporine -lithium -medicine for diabetes -methenamine -other diuretics -phenytoin -primidone -quinidine -sodium bicarbonate -stimulant medicines like dextroamphetamine This list may not describe all possible interactions. Give your health care provider a list of all the medicines, herbs, non-prescription drugs, or dietary supplements you use. Also tell them if you smoke, drink alcohol, or use illegal drugs. Some items may interact with your medicine. What should I watch for while using this medicine? Visit your doctor or health care professional for regular checks on your progress. You will need blood work done regularly. If you are diabetic, check your blood sugar as directed. You may need to be on a special diet while taking this medicine. Ask your doctor. Also, ask how many glasses of fluid you need to drink a day. You must not get dehydrated. You may get drowsy or dizzy. Do not drive, use machinery, or do anything that needs mental alertness until you know how this medicine affects you. Do not stand or sit up quickly, especially if you are an older patient. This reduces the risk of dizzy or fainting spells. This medicine can make you more sensitive to the sun. Keep out of the sun. If you cannot avoid being in the sun, wear protective clothing and use sunscreen. Do not use sun lamps or tanning beds/booths. What side effects may I notice from receiving this medicine? Side effects that you should report to your doctor or health care professional as soon as possible: -allergic reactions like skin rash, itching or hives, swelling of the face, lips, or tongue -breathing problems -confusion, depression -dark urine -fever -numbness, tingling in hands or feet -  redness, blistering, peeling or loosening of the skin, including inside the mouth -ringing in the ears -seizures -unusually weak or tired -yellowing of the eyes or skin Side effects that usually do not require medical  attention (report to your doctor or health care professional if they continue or are bothersome): -change in taste -diarrhea -headache -loss of appetite -nausea, vomiting -passing urine more often This list may not describe all possible side effects. Call your doctor for medical advice about side effects. You may report side effects to FDA at 1-800-FDA-1088. Where should I keep my medicine? Keep out of the reach of children. Store at room temperature between 20 and 25 degrees C (68 and 77 degrees F). Throw away any unused medicine after the expiration date. NOTE: This sheet is a summary. It may not cover all possible information. If you have questions about this medicine, talk to your doctor, pharmacist, or health care provider.  2019 [employer]/Gold Standard (2008-01-05 10:59:40)

## 2018-12-16 ENCOUNTER — Other Ambulatory Visit: Payer: Self-pay | Admitting: Neurology

## 2018-12-16 DIAGNOSIS — G912 (Idiopathic) normal pressure hydrocephalus: Secondary | ICD-10-CM

## 2018-12-16 NOTE — Telephone Encounter (Signed)
[employer] Auth: [id number] (exp. 12/16/18 to 01/30/19) order sent to GI. They will reach out to the pt to schedule.

## 2018-12-16 NOTE — Telephone Encounter (Signed)
When you get a chance please put a new CT Head order in just the CT Head not with it attach to charm study.

## 2018-12-16 NOTE — Telephone Encounter (Signed)
Order corrected

## 2018-12-21 ENCOUNTER — Ambulatory Visit: Payer: 59 | Admitting: Allergy and Immunology

## 2018-12-21 ENCOUNTER — Encounter: Payer: Self-pay | Admitting: Allergy and Immunology

## 2018-12-21 VITALS — BP 130/74 | HR 80 | Resp 18

## 2018-12-21 DIAGNOSIS — F458 Other somatoform disorders: Secondary | ICD-10-CM

## 2018-12-21 DIAGNOSIS — K219 Gastro-esophageal reflux disease without esophagitis: Secondary | ICD-10-CM | POA: Diagnosis not present

## 2018-12-21 DIAGNOSIS — J3089 Other allergic rhinitis: Secondary | ICD-10-CM | POA: Diagnosis not present

## 2018-12-21 DIAGNOSIS — B370 Candidal stomatitis: Secondary | ICD-10-CM | POA: Diagnosis not present

## 2018-12-21 DIAGNOSIS — J4550 Severe persistent asthma, uncomplicated: Secondary | ICD-10-CM

## 2018-12-21 NOTE — Patient Instructions (Signed)
  1. Continue immunotherapy and Auvi-Q  2. Continue to Treat and prevent inflammation:   A. Asmanex 200 -2 puffs 2 times a day with a spacer   B. montelukast 10 mg tablet once a day  C. Budesonide 0.5 mg in 100 ml nasal saline 1-7 times per week   D. Mepolizumab every 4 weeks   3. Treat reflux:   A.  Omeprazole OTC mg tablet 1 time per day  4. Continue nystatin oral solution 5 mL swish and swallow every day    5. If needed:   A. nasal saline / wash  B. OTC antihistamine  C. Proair HFA / Xopenex 2 inhalations or albuterol every 4-6 hours  6. Use Rebreathing technique if needed  7. Return in 12 weeks or earlier if problem

## 2018-12-21 NOTE — Progress Notes (Signed)
Follow-up Note  Referring Provider: Gaspar Garbe, MD Primary Provider: Lucila Maine Date of Office Visit: 12/21/2018  Subjective:   [name] (DOB: 1981/04/16) is a 38 y.o. male who [facility] in re-evaluation of the following:  HPI: [name] presents to this clinic in reevaluation of his asthma and allergic rhinitis and reflux and history of recurrent infections and apparent acetazolamide side effect.  His last visit to this clinic was 29 November 2018 at which point in time we suspect that acetazolamide was giving rise to some of his respiratory and GI issues and we had him start hyperventilation rebreathing technique when needed.  He has actually done better.  He has had his acetazolamide decreased from 1500 to 1000 mg and he is a little bit better with his breathing.  He uses a rebreathing technique about twice a day which works quite well when he gets extremely tachypneic.  He still continues to have some issues with chronic nausea.  He still gets a little bit short of breath if he exerts himself to any degree but he thinks that now he is really out of shape as he has not really had much physical activity.  He continues on daily nystatin for his chronic oral thrush.  Concerning acetazolamide use, the plan is for him to have a lumbar puncture next week to check his pressure and if his pressure is okay there will be a further diminution in his dose of acetazolamide.  If his pressure is up then apparently an alternative drug will be attempted to control his pseudotumor cerebri.  Allergies as of 12/21/2018      Reactions   Flonase [fluticasone] Shortness Of Breath   Sulfites Itching, Swelling   Molds & Smuts    Tetracyclines & Related Other (See Comments)   increased intracranial pressure      Medication List      acetaZOLAMIDE 500 MG capsule Commonly known as:  DIAMOX TAKE 1 CAPSULE BY MOUTH IN THE MORNING AND 1  CAPSULE IN THE EVENING.   albuterol 108 (90 Base) MCG/ACT inhaler Commonly known as:  PROAIR HFA Inhale 2 puffs into the lungs every 6 (six) hours as needed for wheezing or shortness of breath.   albuterol (2.5 MG/3ML) 0.083% nebulizer solution Commonly known as:  PROVENTIL Take 3 mLs (2.5 mg total) by nebulization every 4 (four) hours as needed for wheezing or shortness of breath.   ASMANEX HFA 200 MCG/ACT Aero Generic drug:  Mometasone Furoate INHALE 2 PUFFS INTO THE LUNGS TWICE DAILY   atorvastatin 20 MG tablet Commonly known as:  LIPITOR   AUVI-Q 0.3 mg/0.3 mL Soaj injection Generic drug:  EPINEPHrine Use as directed for life-threatening allergic reaction.   budesonide 0.5 MG/2ML nebulizer solution Commonly known as:  PULMICORT Take 2 mLs (0.5 mg total) by nebulization as directed.   budesonide-formoterol 160-4.5 MCG/ACT inhaler Commonly known as:  SYMBICORT Inhale 2 puffs into the lungs 2 (two) times daily.   clonazePAM 0.5 MG tablet Commonly known as:  KLONOPIN Take 0.5 mg by mouth 2 (two) times daily as needed.   glucagon 1 MG injection Follow package directions for low blood sugar.   HUMALOG 100 UNIT/ML injection Generic drug:  insulin lispro INJECT 300 UNITS UNDER THE SKIN VIA INSULIN PUMP EVERY 48-72 HOURS PER HYPERGLYCEMIA AND DKA PROTOCOLS   losartan 50 MG tablet Commonly known as:  COZAAR TAKE 1 TABLET(50 MG) BY MOUTH DAILY  montelukast 10 MG tablet Commonly known as:  SINGULAIR TAKE 1 TABLET(10 MG) BY MOUTH AT BEDTIME   nystatin 100000 UNIT/ML suspension Commonly known as:  MYCOSTATIN Swish and swallow 5 mls daily   omeprazole 20 MG capsule Commonly known as:  PRILOSEC Take 20 mg by mouth daily.   ONE TOUCH ULTRA TEST test strip Generic drug:  glucose blood USE TO CHECK BLOOD SUGAR SIX TIMES DAILY   TYLENOL 500 MG tablet Generic drug:  acetaminophen Take 1,000 mg by mouth 2 (two) times daily as needed.       Past Medical History:    Diagnosis Date  . ADHD (attention deficit hyperactivity disorder)   . Autonomic neuropathy due to diabetes (HCC)   . Diabetes (HCC)   . Fatigue   . Goiter   . High blood pressure   . High cholesterol   . Hypercholesterolemia   . Hypertension   . Hypoglycemia associated with diabetes (HCC)   . Obesity   . Tachycardia   . Type 1 diabetes mellitus not at goal [facility])   . Uncontrolled DM with microalbuminuria or microproteinuria     Past Surgical History:  Procedure Laterality Date  . REFRACTIVE SURGERY     x9  . WISDOM TOOTH EXTRACTION      Review of systems negative except as noted in HPI / PMHx or noted below:  Review of Systems  Constitutional: Negative.   HENT: Negative.   Eyes: Negative.   Respiratory: Negative.   Cardiovascular: Negative.   Gastrointestinal: Negative.   Genitourinary: Negative.   Musculoskeletal: Negative.   Skin: Negative.   Neurological: Negative.   Endo/Heme/Allergies: Negative.   Psychiatric/Behavioral: Negative.      Objective:   Vitals:   12/21/18 1604  BP: 130/74  Pulse: 80  Resp: 18          Physical Exam Constitutional:      Appearance: He is not diaphoretic.  HENT:     Head: Normocephalic.     Right Ear: Tympanic membrane, ear canal and external ear normal.     Left Ear: Tympanic membrane, ear canal and external ear normal.     Nose: Nose normal. No mucosal edema or rhinorrhea.     Mouth/Throat:     Pharynx: Uvula midline. No oropharyngeal exudate.  Eyes:     Conjunctiva/sclera: Conjunctivae normal.  Neck:     Thyroid: No thyromegaly.     Trachea: Trachea normal. No tracheal tenderness or tracheal deviation.  Cardiovascular:     Rate and Rhythm: Normal rate and regular rhythm.     Heart sounds: Normal heart sounds, S1 normal and S2 normal. No murmur.  Pulmonary:     Effort: No respiratory distress.     Breath sounds: Normal breath sounds. No stridor. No wheezing or rales.  Lymphadenopathy:     Head:     Right  side of head: No tonsillar adenopathy.     Left side of head: No tonsillar adenopathy.     Cervical: No cervical adenopathy.  Skin:    Findings: No erythema or rash.     Nails: There is no clubbing.   Neurological:     Mental Status: He is alert.     Diagnostics:    Spirometry was performed and demonstrated an FEV1 of 2.66 at 58 % of predicted.  The patient had an Asthma Control Test with the following results: ACT Total Score: 12.    Results of blood tests obtained 29 November 2018 identified chloride 109  mmol/L, CO2 17 mmol/L, WBC 11.6, absolute eosinophil 0, absolute lymphocyte 1,800, hemoglobin 17.4, platelet 330  Assessment and Plan:   1. Asthma, severe persistent, well-controlled   2. Perennial allergic rhinitis   3. Gastroesophageal reflux disease, esophagitis presence not specified   4. Oral candidiasis   5. Hyperventilation syndrome     1. Continue immunotherapy and Auvi-Q  2. Continue to Treat and prevent inflammation:   A. Asmanex 200 -2 puffs 2 times a day with a spacer   B. montelukast 10 mg tablet once a day  C. Budesonide 0.5 mg in 100 ml nasal saline 1-7 times per week   D. Mepolizumab every 4 weeks   3. Treat reflux:   A.  Omeprazole OTC mg tablet 1 time per day  4. Continue nystatin oral solution 5 mL swish and swallow every day    5. If needed:   A. nasal saline / wash  B. OTC antihistamine  C. Proair HFA / Xopenex 2 inhalations or albuterol every 4-6 hours  6. Use Rebreathing technique if needed  7. Return in 12 weeks or earlier if problem   I think that [name] is better on a lower dose of acetazolamide and he is working through the issue of coming off acetazolamide with his neurologist.  I am very suspicious that his acetazolamide has produced problems with no only his respiratory tract but his GI tract.  Hopefully there is an alternative drug for him to utilize in the near future regarding his pseudotumor cerebri.  He will maintain on a large  collection of medical therapy directed against respiratory tract inflammation and reflux as noted above.  I will see him back in his clinic in 12 weeks or earlier if there is a problem.  Laurette Schimke, MD Allergy / Immunology [facility]

## 2018-12-22 ENCOUNTER — Encounter: Payer: Self-pay | Admitting: Allergy and Immunology

## 2018-12-23 ENCOUNTER — Other Ambulatory Visit: Payer: 59

## 2018-12-23 ENCOUNTER — Ambulatory Visit
Admission: RE | Admit: 2018-12-23 | Discharge: 2018-12-23 | Disposition: A | Discharge status: Home / Self Care | Payer: 59 | Source: Ambulatory Visit | Attending: Neurology | Admitting: Neurology

## 2018-12-23 DIAGNOSIS — G912 (Idiopathic) normal pressure hydrocephalus: Secondary | ICD-10-CM

## 2018-12-25 ENCOUNTER — Other Ambulatory Visit: Payer: Self-pay | Admitting: Neurology

## 2018-12-25 DIAGNOSIS — Z6832 Body mass index (BMI) 32.0-32.9, adult: Secondary | ICD-10-CM

## 2018-12-25 DIAGNOSIS — E109 Type 1 diabetes mellitus without complications: Secondary | ICD-10-CM

## 2018-12-25 DIAGNOSIS — E049 Nontoxic goiter, unspecified: Secondary | ICD-10-CM

## 2018-12-25 DIAGNOSIS — G932 Benign intracranial hypertension: Secondary | ICD-10-CM

## 2018-12-25 DIAGNOSIS — E669 Obesity, unspecified: Secondary | ICD-10-CM

## 2018-12-27 ENCOUNTER — Ambulatory Visit (INDEPENDENT_AMBULATORY_CARE_PROVIDER_SITE_OTHER): Payer: 59 | Admitting: "Endocrinology

## 2018-12-27 ENCOUNTER — Encounter: Payer: Self-pay | Admitting: Neurology

## 2018-12-27 ENCOUNTER — Telehealth: Payer: Self-pay

## 2018-12-27 ENCOUNTER — Encounter (INDEPENDENT_AMBULATORY_CARE_PROVIDER_SITE_OTHER): Payer: Self-pay | Admitting: "Endocrinology

## 2018-12-27 VITALS — BP 122/70 | HR 104 | Ht 72.0 in | Wt 254.2 lb

## 2018-12-27 DIAGNOSIS — R000 Tachycardia, unspecified: Secondary | ICD-10-CM | POA: Diagnosis not present

## 2018-12-27 DIAGNOSIS — E782 Mixed hyperlipidemia: Secondary | ICD-10-CM

## 2018-12-27 DIAGNOSIS — E049 Nontoxic goiter, unspecified: Secondary | ICD-10-CM

## 2018-12-27 DIAGNOSIS — IMO0001 Reserved for inherently not codable concepts without codable children: Secondary | ICD-10-CM

## 2018-12-27 DIAGNOSIS — I10 Essential (primary) hypertension: Secondary | ICD-10-CM

## 2018-12-27 DIAGNOSIS — R4584 Anhedonia: Secondary | ICD-10-CM

## 2018-12-27 DIAGNOSIS — E063 Autoimmune thyroiditis: Secondary | ICD-10-CM

## 2018-12-27 DIAGNOSIS — E1043 Type 1 diabetes mellitus with diabetic autonomic (poly)neuropathy: Secondary | ICD-10-CM

## 2018-12-27 DIAGNOSIS — R5383 Other fatigue: Secondary | ICD-10-CM

## 2018-12-27 DIAGNOSIS — G932 Benign intracranial hypertension: Secondary | ICD-10-CM

## 2018-12-27 DIAGNOSIS — E1065 Type 1 diabetes mellitus with hyperglycemia: Secondary | ICD-10-CM | POA: Diagnosis not present

## 2018-12-27 DIAGNOSIS — I4711 Inappropriate sinus tachycardia, so stated: Secondary | ICD-10-CM

## 2018-12-27 DIAGNOSIS — E10649 Type 1 diabetes mellitus with hypoglycemia without coma: Secondary | ICD-10-CM

## 2018-12-27 DIAGNOSIS — G609 Hereditary and idiopathic neuropathy, unspecified: Secondary | ICD-10-CM

## 2018-12-27 LAB — POCT GLUCOSE (DEVICE FOR HOME USE): POC Glucose: 179 mg/dl — AB (ref 70–99)

## 2018-12-27 NOTE — Progress Notes (Signed)
Subjective:  Patient Name: [name] Date of Birth: February 23, 1981  MRN: [id number]  [name]  presents to the office today for follow-up of his type 1 diabetes mellitus, goiter, obesity, combined hyperlipidemia, adult ADHD, fatigue, autonomic neuropathy, tachycardia, hypertension, microalbuminuria, hypoglycemia, sinusitis, bronchitis, allergies, proliferative retinopathy, and pseudotumor cerebri/increased intracranial hypertension.   HISTORY OF PRESENT ILLNESS:   [name] is a 38 y.o. Caucasian gentleman. [name] was unaccompanied.  1. The patient was first referred to me on 01/08/2006 by his family physician, Dr. Roanna Epley, for evaluation and management of type 1 diabetes mellitus and related problems. The patient was 38 years old.  A. [name] had been diagnosed with type 1 diabetes somewhere in 2001-2002, at the age of 37-19. He was initially diagnosed with type 2 diabetes mellitus, but was later re-classified as type 1 diabetes mellitus. He had been followed by Dr. Arther Dames, staff endocrinologist at [facility]. [name] was started on an insulin pump approximately 18 months prior to first seeing me. His pump was a Medtronic Paradigm 712. His blood glucose control was fair-to-poor. He was frequently not checking blood sugars or taking insulin boluses as often as he needed to. He frequently noted fast heart rate. The patient's past medical history was positive for hypertension, for which he was taking lisinopril, and for what his mother called "extreme ADHD". He had previously been taking ADHD medicines but had discontinued them due to adverse effects. He had prior ankle injuries and knee injuries, to include tears of the lateral menisci bilaterally. He had not had any surgeries. He was working for a [employer] that had many different job sites in different parts of the [place] and in other countries as well. 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 He did not use tobacco or drugs, but did drink alcohol occasionally.  B. On physical examination, his weight was 234 pounds, his height was 70 inches, his BMI was 33.2. Blood pressure was 128/70. Hemoglobin A1c was 9.5%. Heart rate was 96. He had normal affect and fair insight. He had a 25-30 gram goiter. He had normal 1+ DP pulses in his feet. He had normal sensation in his feet to touch, vibration, and monofilament. His CMP was normal except for glucose of 251. His cholesterol was 257, triglycerides 143, HDL 49, and LDL 179. TSH was 1.017, free T4 was 1.06, and free T3 was 3.3. His urinary microalbumin: creatinine ratio was 20.1 (normal less than 30).   C. The patient clearly needed better blood glucose control, which would only occur if he had better adherence to his plan. He appeared to have autonomic neuropathy, manifested by inappropriate sinus tachycardia.  His thyroid goiter suggested that he might have evolving Hashimoto's disease. He stated that he had lost some weight recently. I encouraged him to check his blood sugars more frequently and to take both correction boluses and food boluses.  2. During the past twelve years, [name] has had to deal with many different problems:  A. T1DM/hypoglycemia:    1). 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 His hemoglobin A1c values have varied from 8.3-11.2%.   2). After starting him on a Medtronic 760G insulin pump and Guardian 3 sensor on 01/12/17, his BG control has improved significantly, despite the illnesses described below.   B.  DM complications: .   1). He has bilateral diabetic retinopathy and is followed by his retinologist, Dr. Verdell Carmine, MD.   2). He has autonomic neuropathy manifested by inappropriate sinus tachycardia and gastroparesis causing postprandial bloating and GERD. These  problems have varied inversely with his degree of BG control.   C. Combined hyperlipidemia:     1). On 12/03/10 his total cholesterol was 270, triglycerides 94, HDL 50, and LDL 201. I started him on Crestor, 10 mg per day. He has since begun treatment with atorvastatin and his cholesterol values had improved.    2). On 12/29/16 his lipids had improved, but his cholesterol values were still elevated.    D. Allergies, sinusitis, and bronchitis:   1). Unfortunately, beginning in about June 2018, [name] began to develop recurrent episodes of allergic rhinitis, head fullness, sinusitis, and bronchitis. He also developed headaches, muscle pains and spasms, lethargy, fatigue, and weakness that were presumably due in large part to severe allergies. His atorvastatin and several other medications were stopped at that time. Despite receiving excellent care from his allergist, to include allergy injections and biologic injections, [name]'s allergic issues have persisted.   E. Increased intracranial pressure:    1). [name] was seen in consultation by Dr. Melvyn Novas, neurology, on 05/28/17 for headaches, tremors of his right hand and foot, shakey vision, and symptoms c/w OSA. His initial sleep study did not show any significant amount of OSA.    2). On 03/08/18, Dr.Dohmeier was notified that [name]'s eye doctor had seen signs of fluid build up in his right eye. An LP was performed in radiology on 03/09/18. Opening pressure was 28 cm of water, c/w increased intracranial pressure and pseudotumor cerebri. This problem was initially though to be due to tetracycline usage, so TCN was discontinued and Diamox begun. The Diamox doses have been changed over time. Many of his neuromuscular symptoms improved.   G. Adult ADD/depression: [name] was evaluated by Dr Piedad Climes, a noted neuropsychiatrist in [place] in 2018. At his last follow up visit in November 2018, Dr. Shane Crutch decided to wait until [name]'s overall health situation   stabilized before trying any new medications.   H. General physical and emotional health: [name] has been very sick for much of the past 2 years. He has been unable to work full-time and has become almost confined to his home. He has become increasingly anhedonic over time.    3. [name]'s last [facility] visit was on 10/28/1918. At that visit we changed two of his ICRs. He thinks those changes were beneficial.   A. Dr. Lucie Leather was concerned that his CO2 levels were too low due to Diamox. The dose of Diamox was reduced last week. He had a noncontrast head CT on 12/24/18. The impression was "Unremarkable head CT". He thinks that he is breathing better since reducing the Diamox, but his neuro symptoms are worse.  Dr. Vickey Huger plans to do an LP next Monday. If the pressure is not elevated, his Diamox dose may be reduced further   B. He is not sure if his allergies are better or worse. He only leaves his house in order to go to doctors' visits.   C. He still has nausea every day. Postprandial bloating has improved.  He saw Dr. Amada Jupiter at [facility] on 08/10/18. Dr. Myrtie Neither discontinued metamucil and started Miralax. Gave is now taking OTC prilosec.  D. He has not had any allergy injections for two months, but has had his monthly biologic  injection.    E. He saw Dr. Luciana Axe again on 11/09/18. His diabetic retinopathy has stabilized  F. He continues to have problems with lactose intolerance. When he avoids lactose, however, he is asymptomatic. Some foods such as shellfish cause his throat to swell up.   H. He is using Humalog aspart insulin in his insulin pump. He takes losartan, 50 mg/day. He rarely has any coughing unless his allergies act up. .    I. His second sleep study was not bad enough to start on C-pap.    J. His BGs have varied with his illnesses, with steroid usage, and with the amount of postprandial bloating and constipation that he has. He no longer has to delay his boluses as often, because he is not as  worried that his gastric emptying will be too delayed and cause hypoglycemia.     4. Pertinent Review of Systems: Constitutional: [name] feels "wrung out, sore, with a congested head". He is still tired a lot. He is more  sore and achy. He has had a recurrence of headaches, visual color changes, muscle spasms, and tingling since reducing the Diamox.   Eyes: As above. His vision is usually good with his glasses. His eyes tire easily. .  Neck: He has again has some intermittent anterior neck swelling and tenderness.  Heart: "My lungs hurt when I hyperventilate." Gastrointestinal: As above. He has nausea now essentially all day. He also has been having reflux. The patient has no other complaints of excessive hunger, heartburn, acid reflux, upset stomach, stomach aches or pains, or diarrhea while taking Prilosec, 10 mg, daily.  Legs: As above. He occasionally still has the sensation of vibration in his legs, perhaps about every two weeks. These sensations may last for up to 20 minutes. Muscle mass and strength seem normal. There are no other complaints of numbness, tingling, burning, or pain. No edema is noted. Feet: There are no obvious foot problems now. There are no complaints of numbness, burning, or pain.  No edema is noted. Hypoglycemia: He occasionally has low BGs, especially if he does not eat or eats late.  Emotional: "I'm trying to do my best, but I'm getting pretty worn thin." "I can't talk with my family members about these problems, because they just want to jump in and fix things."      5. BG printout: We have data from the past 2 weeks. [name] changes sites every 3-4 days. He checks BGs 3-7 times daily, average 4.9 times per day. Average BG is 177 compared with 176 at his last visit and with 195 at his prior visit. BGs varied from 96-324, compared with 89-376 at hs last visit and with 101-372 at his prior visit. His higher BGs occurred after lunch and dinner. His BGs tend to be lower from 8 AM to  11 AM.   6. CGM printout: Skin glucose values and BG values correlate very well. Average SG was 153, compared with 156 at his last visit and with 154 at his prior visit  He was in auto mode 99% of the time. He was in zone 79% of the time. His highest SGs again occurred around  2-4 PM. He still sometimes consumes more carbs than he realizes. He also sometimes has lower SGs if he eats late. When his site is working well and his health is pretty good, SGs vary from the 90s to the low 200s.    PAST MEDICAL, FAMILY, AND SOCIAL HISTORY:  Past Medical History:  Diagnosis Date  . ADHD (attention deficit hyperactivity disorder)   . Autonomic neuropathy due to diabetes (HCC)   . Diabetes (HCC)   . Fatigue   . Goiter   . High blood pressure   . High cholesterol   . Hypercholesterolemia   . Hypertension   . Hypoglycemia associated with diabetes (HCC)   . Obesity   . Tachycardia   . Type 1 diabetes mellitus not at goal [facility])   . Uncontrolled DM with microalbuminuria or microproteinuria     Family History  Problem Relation Age of Onset  . Dementia Paternal Aunt   . Schizophrenia Paternal Aunt   . Cancer Maternal Grandmother   . Diabetes Maternal Grandmother        T2 DM  . Cancer Maternal Grandfather   . Heart disease Father   . Thyroid disease Neg Hx      Current Outpatient Medications:  .  acetaminophen (TYLENOL) 500 MG tablet, Take 1,000 mg by mouth 2 (two) times daily as needed., Disp: , Rfl:  .  acetaZOLAMIDE (DIAMOX) 500 MG capsule, TAKE 1 CAPSULE BY MOUTH IN THE MORNING AND 1 CAPSULE IN THE EVENING., Disp: 60 capsule, Rfl: 1 .  albuterol (PROAIR HFA) 108 (90 Base) MCG/ACT inhaler, Inhale 2 puffs into the lungs every 6 (six) hours as needed for wheezing or shortness of breath., Disp: 1 Inhaler, Rfl: 2 .  albuterol (PROVENTIL) (2.5 MG/3ML) 0.083% nebulizer solution, Take 3 mLs (2.5 mg total) by nebulization every 4 (four) hours as needed for wheezing or shortness of breath., Disp: 75  mL, Rfl: 1 .  ASMANEX HFA 200 MCG/ACT AERO, INHALE 2 PUFFS INTO THE LUNGS TWICE DAILY, Disp: 13 g, Rfl: 5 .  atorvastatin (LIPITOR) 20 MG tablet, , Disp: , Rfl: 0 .  AUVI-Q 0.3 MG/0.3ML SOAJ injection, Use as directed for life-threatening allergic reaction., Disp: 4 Device, Rfl: 2 .  budesonide (PULMICORT) 0.5 MG/2ML nebulizer solution, Take 2 mLs (0.5 mg total) by nebulization as directed., Disp: 2 mL, Rfl: 3 .  budesonide-formoterol (SYMBICORT) 160-4.5 MCG/ACT inhaler, Inhale 2 puffs into the lungs 2 (two) times daily., Disp: 1 Inhaler, Rfl: 5 .  glucagon 1 MG injection, Follow package directions for low blood sugar., Disp: 1 each, Rfl: 3 .  HUMALOG 100 UNIT/ML injection, INJECT 300 UNITS UNDER THE SKIN VIA INSULIN PUMP EVERY 48-72 HOURS PER HYPERGLYCEMIA AND DKA PROTOCOLS, Disp: 40 mL, Rfl: 5 .  losartan (COZAAR) 50 MG tablet, TAKE 1 TABLET(50 MG) BY MOUTH DAILY, Disp: 90 tablet, Rfl: 1 .  montelukast (SINGULAIR) 10 MG tablet, TAKE 1 TABLET(10 MG) BY MOUTH AT BEDTIME, Disp: 30 tablet, Rfl: 5 .  nystatin (MYCOSTATIN) 100000 UNIT/ML suspension, Swish and swallow 5 mls daily, Disp: 150 mL, Rfl: 2 .  omeprazole (PRILOSEC) 20 MG capsule, Take 20 mg by mouth daily., Disp: , Rfl:  .  ONE TOUCH ULTRA TEST test strip, USE TO CHECK BLOOD SUGAR SIX TIMES DAILY, Disp: 600 each, Rfl: 1 .  clonazePAM (KLONOPIN) 0.5 MG tablet, Take 0.5 mg by mouth 2 (two) times daily as needed., Disp: , Rfl: 0  Current Facility-Administered Medications:  [name]  Mepolizumab SOLR 100 mg, 100 mg, Subcutaneous, Q28 days, Alfonse Spruce, MD, 100 mg at 12/08/18 1614  Facility-Administered Medications Ordered in Other Visits:  .  gadopentetate dimeglumine (MAGNEVIST) injection 20 mL, 20 mL, Intravenous, Once PRN, Dohmeier, Porfirio Mylar, MD  Allergies as of 12/27/2018 - Review Complete 12/27/2018  Allergen Reaction Noted  . Flonase [fluticasone] Shortness Of  Breath 06/10/2017  . Sulfites Itching and Swelling 02/04/2018  . Molds &  smuts  03/09/2018  . Tetracyclines & related Other (See Comments) 03/09/2018    1. Work and Family: He is not working part-time at home as much. 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 He hopes to return to full-time work soon. 2. Activities: He has not done much physical activity this calendar year. He was more physically active when he felt good back in November 2018.    3. Smoking, alcohol, or drugs: He has not been drinking any alcohol or beer. No tobacco or drugs. 4. Primary Care Provider: Dr. Guerry Bruin, MD, [facility] 5. Neurology: Dr. Vickey Huger 6. Ophthalmology:  7. Retinology: Dr. Luciana Axe 8. Urology: Dr. Annabell Howells at [facility] 9. Sports medicine: Dr. Darrick Penna  REVIEW OF SYSTEMS: There are no other significant problems involving Broady's other body systems.   Objective:  Vital Signs:  BP 122/70   Pulse (!) 104   Ht 6' (1.829 m)   Wt 254 lb 3.2 oz (115.3 kg)   BMI 34.48 kg/m    Ht Readings from Last 3 Encounters:  12/27/18 6' (1.829 m)  12/15/18 6' (1.829 m)  10/28/18 6' (1.829 m)   Wt Readings from Last 3 Encounters:  12/27/18 254 lb 3.2 oz (115.3 kg)  12/15/18 260 lb (117.9 kg)  10/28/18 260 lb (117.9 kg)   PHYSICAL EXAM:  Constitutional: The patient appears tired and worn down. His weight has decreased 6 pounds since his last visit, partly due to being more nauseous and partly due to trying to eat less. His affect is subdued. His insight is normal.  Face: The face appears normal.  Eyes: He does not have much allergic conjunctivitis. There is no obvious arcus or proptosis. Moisture appears normal.  Mouth: The oropharynx and tongue appear normal. Oral moisture is normal. Neck: The neck appears to be visibly normal. No carotid bruits are noted. He has a short neck. His strap muscles are smaller. The thyroid gland is low-lying and is again mildly enlarged at about 20+ grams in size.The consistency of the gland is normal. The  thyroid gland is tender to palpation today in the right mid-lobe.  Lungs: The lungs are clear to auscultation. Air movement is good.  Heart: Heart rate and rhythm are regular. Heart sounds S1 and S2 are normal. I did not appreciate any pathologic cardiac murmurs. Abdomen: The abdomen is enlarged. Bowel sounds are normal. There is no obvious hepatomegaly, splenomegaly, or other mass effect. The abdomen is somewhat tender to deep palpation laterally in both sides. Arms: Muscle size and bulk are normal for age. Hands: There is no tremor of his hands today. Phalangeal and metacarpophalangeal joints are normal. Palmar muscles are normal. Palmar skin is normal. Palmar moisture is also normal. Legs: Muscles appear normal for age. No edema is present. Feet: 1+ right DP pulse, 2+ left DP pulse. Neurologic: Strength is normal for age in both the upper and lower extremities. Muscle tone is normal. Sensation to touch is normal in both legs and both feet.   LAB DATA:   Labs 23/02/20: CBG 179  Labs 11/29/18: CMP with glucose 198, chloride 109 (ref 96-106, CO2 17 (ref 20-29); CBC with WBC 11.6 (ref 3.4-10.8), PMNs 8.6 (ref 1.4-7.0), and monocytes 1.0 (ref 0.1-0.9)  Labs 10/28/18: HbA1c 6.9%, CBG 155  Labs 10/06/18: ANA titer negative, ANCA titers negative, rheumatoid factor negative, cyclic citrullin peptide 7 (ref 0-190  Labs  07/27/18: CBG 193  Labs 05/31/18: HbA1c 6.6%, CBG 202  Labs 03/18/18: CBG 178  Labs 01/13/18: HbA1c 6.8%, CBG 194; TSH 0.98, free T4 1.3; free T3 3.6; CMP normal except glucose 191; urinary microalbumin/creatinine ratio 35 (ref <30)  Labs 11/17/17: CBG 170  Labs 10/13/17: HbA1c 6.8%, CBG 191  Labs 08/24/17: CBG 117  Labs 07/20/17: CBG 216  Labs 07/08/17: IgA, IgG, and IgM normal. CBC normal.  Labs 06/16/17: HbA1c 6.5%, CBG 193  Labs 05/15/17: CBG 209; TSH 0.90, free T4 1.2, free T3 3.8  Labs 03/05/17: HbA1c 7.3%, CBG 241  Labs 02/20/17: CBG 180 fasting  Labs 01/29/17: CBG  148  Labs 01/12/17: CBG 116  Labs 12/29/16: HbA1c 9.1%, CBG 223; TSH 0.62, free T4 1.1, free T3 3.5; urine microalbumin/creatinine ratio 22; cholesterol 171, triglycerides 93, HDL 40, LDL 112;  CMP glucose 201  Labs 09/26/16: HbA1c 9.5%  Labs 08/13/16: Celiac panel negative; CMP normal except for glucose 207; CBC normal  Labs 06/13/16: HbA1c 9.8%; LH 3.6, FSH 2.3, testosterone 533 (ref 250-827), free testosterone 59.4 (ref 47-244)  Labs 02/11/16: HbA1c 9.2%  Labs 01/07/16: HbA1c 10.5%; CBC with Hgb elevated at 17.2%; LH 3.9, FSH 2.0; TSH 1.53, free T4 1.3, free T3 3.7; cholesterol 262, triglycerides 171, HDL 40, LDL 188; urinary microalbumin/creatinine ratio 31; CMP normal except for glucose 181  Labs 06/29/15. HbA1c 9.6%  Labs 12/28/14: Hemoglobin A1c 9.5% today, compared with 9.5%, at last visit and with 9.9% at the prior visit.  Labs 08/03/14: HbA1c 9.5%; TSH 0.548,free T4 1.10, free T3 3.7, TPO antibody < 1, TSI 30; CMP normal except glucose 191; urinary microalbumin/creatinine ratio 27; cholesterol 260, triglycerides 90, HDL 51, LDL 191   Labs 10/05/13: HbA1c 9.9%  Labs 04/23/13: CMP normal, except glucose 144; cholesterol 216, triglycerides 97, HDL 39, LDL 158; urinary microalbumin/creatinine ratio 6.9; TSH 0.505, free T4 1.17, free T3 3.8  Labs: 01/21/12: TSH 1.251, free T4 1.10, free T3 3.6, CBC normal, CMP normal except for glucose 206, urinary microalbumin/creatinine ratio 6.3, cholesterol 187, triglycerides 67, HDL 45, LDL 129   Assessment and Plan:   ASSESSMENT:  1. T1DM:   A. His BGs and HbA1c are a bit lower and are doing quite well overall, considering the increased illness that he has been having for the past months.   B. His BGs still vary with whether or not he has active allergic rhinitis/sinusitis/bronchitis or other illnesses, whether or not he takes prednisone or other glucocorticoids, whether or not he feels well enough to exercise, and how long he keeps his sites in  place. Almost all of his higher BGs in the past month were due to site changes, sensor changes, and higher carb intake than he sometimes recognized.   C. The combination of the Medtronic 670G pump and Guardian 3 sensor is again helping him achieve good BG control.   D. Dr. Vickey Huger was worried that Diamox might cause erroneous BG or SG values. Thus far his SGs and BGs have correlated well.   E. [name] has done very well at trying to optimize his BG control. Although he occasionally does not bolus enough, or boluses late, or waits too long to change sites, or eats later than he should, [name] is really doing well.  2. Hypoglycemia: He has had no BGs <80.  3. Hypertension: BP is good today. He needs to continue to take meds regularly and to exercise regularly when he feels well enough to do so.  4. Combined hyperlipidemia: His  lipids in March 2017 were too high, but the lipids in March 2018 were much better on atorvastatin. He discontinued the atorvastatin in mid-2018 due to concern that this medication might be causing his neuromuscular symptoms. We will plan to re-start the atorvastatin in the near future once he becomes stable on immunotherapy. We will repeat his lipid panel soon. 5. Autonomic neuropathy with tachycardia and gastroparesis: His gastroparesis has improved somewhat. His heart rate has increased recently, however, perhaps due to worsening of his intracranial pressure.  6. Obesity: Weight has decreased, in part due to nausea. His daily OTC Prilosec is not helping him enough. Eating Right and exercise will help. He needs fewer calories and more exercise. I suggested starting him on Prevacid.  7-8. Goiter/thyroiditis:   A. His thyroid gland is still somewhat enlarged.His thyroiditis is clinically active in the right mid-lobe today. The active thyroiditis that he has had in the past and has again now and the  process of waxing and waning of thyroid gland size are c/w evolving Hashimoto's thyroiditis.    B. He was euthyroid in March 2013, but was borderline hyperthyroid in June 2014. He was euthyroid in October 2015, March 2017, March 2018, July 2018, and March 2019.  9. Fatigue: This problem has worsened during his recent illness.   10. Obstructive sleep apnea: He did not meet the criteria for OSA, but because of his snoring a dental appliance was suggested. I recommended that he obtain a dental appliance. Unfortunately, his former dentist no longer accepts [employer], so [name] has to find a new dentist. 11. Adult ADD: He had an appointment with Dr. Piedad Climes, MD in [place] in November 2018. Dr. Shane Crutch is waiting for [name]'s medical situation to stabilize before beginning any new psych meds.   12. Proliferative diabetic retinopathy: This problem is due to his long period of poorly controlled BGs. The retinopathy was worse earlier in July 2019. It will take at least 6-12 months of near-normal BGs for his retinopathy to stabilize.  He will have follow up exam soon.  13. URI/nasal congestion/vertigo/allergic rhinitis and conjunctivitis: He is doing better today. His family still wants him to go to the [facility] for a comprehensive evaluation. I concur.  14. Neuromuscular symptoms and headaches/increased intracranial pressure:   A. It appeared previously that some of [name]'s headaches were classic tension headaches. When he performed cervical stretching exercises the headache resolved.   B. In the past when Dr. Vickey Huger evaluated [name] for his vague neuromuscular symptoms, she did not find any signs of any serious neurologic disease. His MRI of his brain was unremarkable. At that time his neuromuscular symptoms had resolved.  C. Later, however, the headaches worsened and some of the neuromuscular symptoms recurred. We now know that he had increased ICP and that his headaches improved for a brief time after the LP. He then developed a typical spinal tap headache.  D. In retrospect he  may have had intermittent problems with elevated ICP previously.  E. Dr. Vickey Huger suspects that [name]'s ICP was due to the doxycycline. She will follow this issue over time.   F. He appears to have problems taking Diamox. We'll see what his ICP is next week. He may need a shunt.  15. Calcified lymph node: It is possible that he may have contracted a fungus while he was working in the [employer] several years ago. His thyroid US showed his goiter, but no intrinsic thyroid abnormality. A partially calcified cervical lymph  node on the right was noted, c/w a post infections/inflammatory node. 16. Microalbuminuria: His ratio was mildly elevated in March 2019. He needed tighter BG and BP control. 17. Anhedonia: [name] is more depressed. He appears to had a component of PTSD due to this chronic illness.  I offered to refer him to Ms Baltazar Apo, RN, MSN, a psychiatric nurse specialist who has expertise in helping patient who have both physical and emotional problems. He agrees. I called Ms. Showfety and she agreed to accept [name] as a patient.   PLAN:  1. Diagnostic:  Repeat CBG  today. Annual fasting surveillance labs soon.  Refer to Ms [facility].  2. Therapeutic: Follow DM care plan. Continue losartan. Continue his current pump basal rates, ISFs, ICRs, and BG targets.   Basal rates: basal 1 MN:1.20 6 AM: 1.90 2 PM: 1.60 8 PM: 1.65   ISFs: 35   ICRs: MN: 7 8 AM: 4.5 11 AM: 4.5 9 PM: 7   BG targets:  MN: 130-150 6 AM: 120 10 PM 130-150  3. Patient education:   A. We discussed the illnesses that he has had for the past two years.  We also discussed the need for him to continue to be followed closely by Drs Wylene Simmer, Kozlow, Dohmeier, [place], [place], [place], and [place].   B.  We discussed the fact that he has a relative who is an ENT physician in [place]. That relative advised him to go to the [facility] in [place], [place] to obtain a multidisciplinary evaluation of all of his problems  to see if there might be one unifying cause that we have missed. I told him that it is possible that all of [place] could have missed a unifying cause, but that does not seem to be the case. However, going to [facility] is certainly something that might prove valuable to him. He will check with his medical insurance company to see if his insurance will cover such an evaluation.  4. Follow-up:  2 months  Level of Service: This visit lasted in excess of 90 minutes. More than 50% of the visit was devoted to counseling.  Molli Knock, MD, CDE Adult and Pediatric Endocrinology  12/27/2018 11:37 AM

## 2018-12-27 NOTE — Telephone Encounter (Signed)
Left vm for patient to call back about Ct results. Pt was sent a mychart messge about Ct scan today.

## 2018-12-27 NOTE — Patient Instructions (Signed)
Follow up visit in 2 months.  

## 2018-12-28 ENCOUNTER — Telehealth (INDEPENDENT_AMBULATORY_CARE_PROVIDER_SITE_OTHER): Payer: Self-pay | Admitting: *Deleted

## 2018-12-28 NOTE — Telephone Encounter (Signed)
Patient is returning call for results. 

## 2018-12-28 NOTE — Telephone Encounter (Signed)
Spoke to patient, Advised Dr. Fransico Michael wants him to call Baltazar Apo [phone] ext [phone] and make an appt. I have faxed the paperwork.

## 2018-12-28 NOTE — Telephone Encounter (Signed)
I called pt that CT showed no sign of elevated pressure and allows for a CSF test. The lumbar puncture will check for opening pressure, cells, diff, protein, glucose and oligoclonals.Pt verbalized understanding and has LP schedule next week.

## 2019-01-03 ENCOUNTER — Ambulatory Visit
Admission: RE | Admit: 2019-01-03 | Discharge: 2019-01-03 | Disposition: A | Discharge status: Home / Self Care | Payer: 59 | Source: Ambulatory Visit | Attending: Neurology | Admitting: Neurology

## 2019-01-03 VITALS — BP 117/74 | HR 98

## 2019-01-03 DIAGNOSIS — G932 Benign intracranial hypertension: Secondary | ICD-10-CM

## 2019-01-03 DIAGNOSIS — G912 (Idiopathic) normal pressure hydrocephalus: Secondary | ICD-10-CM

## 2019-01-03 LAB — COMPREHENSIVE METABOLIC PANEL
AG Ratio: 1.8 (calc) (ref 1.0–2.5)
ALT: 26 U/L (ref 9–46)
AST: 18 U/L (ref 10–40)
Albumin: 4.2 g/dL (ref 3.6–5.1)
Alkaline phosphatase (APISO): 106 U/L (ref 36–130)
BUN: 11 mg/dL (ref 7–25)
CO2: 21 mmol/L (ref 20–32)
CREATININE: 0.89 mg/dL (ref 0.60–1.35)
Calcium: 9.1 mg/dL (ref 8.6–10.3)
Chloride: 108 mmol/L (ref 98–110)
Globulin: 2.4 g/dL (calc) (ref 1.9–3.7)
Glucose, Bld: 191 mg/dL — ABNORMAL HIGH (ref 65–99)
Potassium: 3.8 mmol/L (ref 3.5–5.3)
Sodium: 138 mmol/L (ref 135–146)
Total Bilirubin: 0.6 mg/dL (ref 0.2–1.2)
Total Protein: 6.6 g/dL (ref 6.1–8.1)

## 2019-01-03 LAB — VITAMIN B1: Vitamin B1 (Thiamine): 10 nmol/L (ref 8–30)

## 2019-01-03 LAB — LIPID PANEL
CHOLESTEROL: 165 mg/dL (ref <200)
HDL: 33 mg/dL — ABNORMAL LOW (ref >=40)
LDL CHOLESTEROL (CALC): 106 mg/dL — AB
Non-HDL Cholesterol (Calc): 132 mg/dL (calc) — ABNORMAL HIGH (ref <130)
Total CHOL/HDL Ratio: 5 (calc) — ABNORMAL HIGH (ref <5.0)
Triglycerides: 138 mg/dL (ref <150)

## 2019-01-03 LAB — MICROALBUMIN / CREATININE URINE RATIO
Creatinine, Urine: 210 mg/dL (ref 20–320)
Microalb Creat Ratio: 5 mcg/mg creat (ref <30)
Microalb, Ur: 1 mg/dL

## 2019-01-03 LAB — VITAMIN B6: Vitamin B6: 3.4 ng/mL (ref 2.1–21.7)

## 2019-01-03 LAB — TSH: TSH: 2.07 m[IU]/L (ref 0.40–4.50)

## 2019-01-03 LAB — T3, FREE: T3, Free: 3.2 pg/mL (ref 2.3–4.2)

## 2019-01-03 LAB — VITAMIN B12: Vitamin B-12: 451 pg/mL (ref 200–1100)

## 2019-01-03 LAB — THYROID PEROXIDASE ANTIBODY: Thyroperoxidase Ab SerPl-aCnc: <1 IU/mL (ref <9)

## 2019-01-03 LAB — THYROGLOBULIN ANTIBODY: Thyroglobulin Ab: <1 IU/mL (ref <=1)

## 2019-01-03 LAB — T4, FREE: FREE T4: 1.1 ng/dL (ref 0.8–1.8)

## 2019-01-03 NOTE — Discharge Instructions (Signed)

## 2019-01-03 NOTE — Progress Notes (Signed)
One SST tube of blood drawn from right AC space for LP labs by Katie Stokes, RN; site unremarkable. 

## 2019-01-04 ENCOUNTER — Encounter: Payer: Self-pay | Admitting: Neurology

## 2019-01-04 ENCOUNTER — Other Ambulatory Visit: Payer: Self-pay | Admitting: Neurology

## 2019-01-04 MED ORDER — TOPIRAMATE 50 MG PO TABS: 50.0000 mg | ORAL_TABLET | Freq: Every day | ORAL | 5 refills | Status: DC

## 2019-01-05 ENCOUNTER — Telehealth: Payer: Self-pay | Admitting: Neurology

## 2019-01-05 DIAGNOSIS — T8189XA Other complications of procedures, not elsewhere classified, initial encounter: Secondary | ICD-10-CM

## 2019-01-05 DIAGNOSIS — R51 Headache: Secondary | ICD-10-CM

## 2019-01-05 DIAGNOSIS — R519 Headache, unspecified: Secondary | ICD-10-CM

## 2019-01-05 DIAGNOSIS — G932 Benign intracranial hypertension: Secondary | ICD-10-CM

## 2019-01-05 NOTE — Telephone Encounter (Signed)
Called the patient and reviewed in detail the lumbar puncture results. Advised the patient what was mentioned in the previous my chart message instructing him about the medication changes. Informed him of the common side effects associated with taking topamax but that he will be starting on a low dose topamax at bedtime. Pt states that since last time he had a headache post LP that he has taken extra precautions this time. He did have one but laid back down. Patient asks if it comes back should he contact us. I advised him to let us know but that

## 2019-01-05 NOTE — Telephone Encounter (Signed)
-----   Message from Melvyn Novas, MD sent at 01/04/2019  6:13 PM EDT ----- No growth in CSF culture

## 2019-01-06 ENCOUNTER — Ambulatory Visit
Admission: RE | Admit: 2019-01-06 | Discharge: 2019-01-06 | Disposition: A | Discharge status: Home / Self Care | Payer: 59 | Source: Ambulatory Visit | Attending: Neurology | Admitting: Neurology

## 2019-01-06 ENCOUNTER — Other Ambulatory Visit: Payer: Self-pay

## 2019-01-06 DIAGNOSIS — G932 Benign intracranial hypertension: Secondary | ICD-10-CM

## 2019-01-06 DIAGNOSIS — R51 Headache: Secondary | ICD-10-CM

## 2019-01-06 DIAGNOSIS — R519 Headache, unspecified: Secondary | ICD-10-CM

## 2019-01-06 DIAGNOSIS — T8189XA Other complications of procedures, not elsewhere classified, initial encounter: Secondary | ICD-10-CM

## 2019-01-06 MED ORDER — IOPAMIDOL (ISOVUE-M 200) INJECTION 41%
1.0000 mL | Freq: Once | INTRAMUSCULAR | Status: AC
  Administered 2019-01-06: 1 mL via EPIDURAL

## 2019-01-06 NOTE — Discharge Instructions (Signed)

## 2019-01-06 NOTE — Progress Notes (Signed)
Blood obtained from pt's R AC for blood patch by Donnamarie Poag, RN. Pt tolerated procedure well. Site is unremarkable.

## 2019-01-07 ENCOUNTER — Encounter: Payer: Self-pay | Admitting: Neurology

## 2019-01-10 ENCOUNTER — Encounter: Payer: Self-pay | Admitting: Neurology

## 2019-01-10 ENCOUNTER — Encounter (INDEPENDENT_AMBULATORY_CARE_PROVIDER_SITE_OTHER): Payer: Self-pay | Admitting: *Deleted

## 2019-01-14 ENCOUNTER — Telehealth: Payer: Self-pay | Admitting: Neurology

## 2019-01-14 MED ORDER — ALPRAZOLAM 0.25 MG PO TABS: 0.2500 mg | ORAL_TABLET | Freq: Every evening | ORAL | 0 refills | Status: DC | PRN

## 2019-01-14 NOTE — Telephone Encounter (Signed)
Called the patient back and patient states after he got the blood patch on 3/12 on Friday 13th he noticed head pressure had started back up and vision was becoming blurry. Per request though mychart message the patient followed up with eye MD. The eye MD states his eyes were fine. Pt still has the sense of pressure built up behind eyes and head. Then couple days ago his feet started having tingling/burning sensation and he woke up from sleep and his feet were swollen significantly. He states he took allegra, iced and elevated his feet. 20 min later they were better. This pressure has not gotten better despite the recent LP. Advised the patient to check blood pressure. He has a wrist BP cuff and when he took it it was 149/34. I informed him that didn't seem like a accurate reading. He rechecked it and it was 124/89. I advised that I will make Dr Dohmeier aware of all this as I am not sure why the pressure in head behind the eyes is still a problem since they relieved the pressure with the LP. Patient and his mother are asking for a call back on how they should proceed. Advised I will make Dr Dohmeier aware of this and have her reach out to the patient.

## 2019-01-14 NOTE — Addendum Note (Signed)
Addended by: Melvyn Novas on: 01/14/2019 04:04 PM   Modules accepted: Orders

## 2019-01-14 NOTE — Telephone Encounter (Signed)
Pt is asking for a call from RN, he states since he has been on topiramate his feet are swelling.  Pt has noticed the swelling sine the night before last.  Please call

## 2019-01-14 NOTE — Telephone Encounter (Signed)
Pressure behind right eye since LP, not improved after blood patch, Topiramate 50 mg daily - should he stop or take more ? Had burred vision. when laying down he is trembling, shaking. It goes away when sitting up.     Feet feel burning and were swollen in AM. He sleeps seated. That helps with head throbbing and trembling.   We will go to stay on TPM -  I reviewed pharmacological side effects ot TPM, edema was not included.  He has no facial or throat swelling.   Has a Veterinary surgeon. Sees next week on tele health. I will add anti-anxiety med. I offered low dose xanax or ativan. Needs to speak to PCP MD. I urged him to call PCP- mat be show his feet on video.   CD

## 2019-01-14 NOTE — Telephone Encounter (Signed)
I can't correlate TOPAMAX to foot edema, CD

## 2019-01-17 ENCOUNTER — Other Ambulatory Visit: Payer: Self-pay | Admitting: Neurology

## 2019-01-17 ENCOUNTER — Telehealth: Payer: Self-pay | Admitting: Neurology

## 2019-01-17 MED ORDER — TOPIRAMATE 50 MG PO TABS: 50.0000 mg | ORAL_TABLET | Freq: Two times a day (BID) | ORAL | 5 refills | Status: DC

## 2019-01-17 NOTE — Telephone Encounter (Signed)
Pt has called back to inform that the situation he had a few mins. Ago has passed but he would still like a call from RN to discuss

## 2019-01-17 NOTE — Telephone Encounter (Signed)
Called the patient and discussed his concerns. Patient states that he has had the increased head pressure in his head all weekend. He states that he is shakey and he did take half tablet of the xanax and that helped somewhat but that the shakiness is still there. Questioned the patient on how his blood sugar is and he states blood sugars are fine and there are no issues there. Dr Dohmeier recommends that he take the full tablet of the xanax if its needed when he feels the anxiety comes on. I went over the typical side effects with him in regards to topamax. Advised that Dr Vickey Huger recommends that he increase the topamax to 50 mg BID.  The patient questioned what happens if this doesn't help. I informed the patient the importance of giving the medication some time to work.  Pt states he did discuss with PCP about the swelling concerns that he had and they were able to address those concerns. Patient states the PCP advised that Dr Lucie Leather and Dr Dohmeier discuss together about the medication affecting each other, The diamox was helping the patient with treatment but was encouraged to change to other medication per Dr Lucie Leather due to concerns with his asthma Patient states that the singulair can cause shakiness/tremors sometimes too and so the PCP recommended that Dr Vickey Huger and Dr Lucie Leather discuss and assess medication and treatment. Informed the patient I will route this concern to Dr Vickey Huger and since Dr Lucie Leather is in epic we can address this either them through staff message or on the phone note. Pt verbalized understanding and was appreciative for passing the information along.

## 2019-01-17 NOTE — Telephone Encounter (Signed)
ED has not yet contacted me- I am aware of his tremor and him feeling weak.

## 2019-01-17 NOTE — Telephone Encounter (Signed)
Pt called stating that it feels like someone is pushing on his head. Pt was unable to continue the conversation. I requested for someone in the home to come on the phone for him. Dad Ed came on the phone stated that the pt was shaking looking weak, not looking good at all. Pt was advised to go to the ED. Dad verbalized understanding and agreed to take him to the ED. [phone]

## 2019-01-18 ENCOUNTER — Telehealth: Payer: Self-pay | Admitting: Neurology

## 2019-01-18 NOTE — Telephone Encounter (Signed)
This 38 year old patient with type 1 DM  Had been referred to the sleep clinic, had been ordered to have a Split night sleep study here on 03-25-2018, but the study never took place. Headaches, asthma and retinal changes continued to progress.  I obtained an opening pressure for CSF, which returned high, he was relived of some pressure felt better, but could not tolerate  DIAMOX, which created HYPERVENTILATION ( Dr. Lucie Leather) , respiratory alkalosis,  and related nausea,  trembling, tingling- panic attacks.  A Gastric emptying study was negative for gastroparesis. Diamex was d/c and Topamax/ topiramate started.  Repeat CSF testing result again with elevated opening pressure.  Change to Topiramate was not successful, as it did not keep CSF pressure down at 50 mg dose, higher dose allowed retro-orbital  pressure sensation , trembling, hyperventilation again.  He just started on TPM 50 mg bid, too early to see effect.  He will call me back on Friday morning, and give report.  If not effective, will use smaller dose of diamox and 50 mg TPM in combination.   Melvyn Novas, MD

## 2019-01-18 NOTE — Telephone Encounter (Signed)
I want hs PCP ( MD ) to coordinate this , and he has none in Epic .   I will be happy to call Dr Lucie Leather late today. Can you send a similar request to Dr Lucie Leather ? and I will be available for call at 4.30 PM. Thank you.

## 2019-01-19 ENCOUNTER — Ambulatory Visit (INDEPENDENT_AMBULATORY_CARE_PROVIDER_SITE_OTHER): Payer: 59 | Admitting: *Deleted

## 2019-01-19 DIAGNOSIS — J4550 Severe persistent asthma, uncomplicated: Secondary | ICD-10-CM | POA: Diagnosis not present

## 2019-01-25 ENCOUNTER — Other Ambulatory Visit (INDEPENDENT_AMBULATORY_CARE_PROVIDER_SITE_OTHER): Payer: Self-pay | Admitting: "Endocrinology

## 2019-01-25 DIAGNOSIS — IMO0001 Reserved for inherently not codable concepts without codable children: Secondary | ICD-10-CM

## 2019-01-25 DIAGNOSIS — E1065 Type 1 diabetes mellitus with hyperglycemia: Principal | ICD-10-CM

## 2019-01-26 ENCOUNTER — Encounter (INDEPENDENT_AMBULATORY_CARE_PROVIDER_SITE_OTHER): Payer: Self-pay

## 2019-02-01 LAB — CSF CELL COUNT WITH DIFFERENTIAL
RBC Count, CSF: 0 cells/uL (ref 0–10)
WBC, CSF: 0 cells/uL (ref 0–5)

## 2019-02-01 LAB — FUNGUS CULTURE W SMEAR
MICRO NUMBER:: 293530
SMEAR: NONE SEEN
SPECIMEN QUALITY:: ADEQUATE

## 2019-02-01 LAB — CSF CULTURE W GRAM STAIN
MICRO NUMBER:: 293531
Result:: NO GROWTH
SPECIMEN QUALITY:: ADEQUATE

## 2019-02-01 LAB — PROTEIN, CSF: Total Protein, CSF: 32 mg/dL (ref 15–45)

## 2019-02-01 LAB — OLIGOCLONAL BANDS, CSF + SERM

## 2019-02-01 LAB — GLUCOSE, CSF: Glucose, CSF: 110 mg/dL — ABNORMAL HIGH (ref 40–80)

## 2019-02-09 ENCOUNTER — Encounter: Payer: Self-pay | Admitting: Neurology

## 2019-02-09 ENCOUNTER — Telehealth: Payer: Self-pay | Admitting: Neurology

## 2019-02-09 NOTE — Telephone Encounter (Signed)
Called the patient to inform them that our office has placed new protocols in place for our office visits. Due to Covid 19 our office is reducing our number of office visits in order to minimize the risk to our patients and healthcare providers.Our office is now providing the capability to offer the patients virtual visits at this time. Informed of what that process looks like and informed that the Virtual visit will still be billed through insurance as such. Due to [place],informed the patient since the appointment is taking place over the phone/internet app, we can't guarantee the security of the phone line. With that said if we do move forward I would have to get verbal consent to completed the call over the phone. Patient gave verbal consent to move forward with the video visit. I have reviewed the patient's chart and made sure that everything is up to date. Patient is also made aware that since this is a video visit we are able to complete the visit but a physical exam is not able to be done since the patient is not present in person. Pt's email is [email or web address]. Pt understands that the [employer] webex software must be downloaded and operational on the device pt plans to use for the visit. Pt verbalized understanding of this information and will states to be ready for the visit at least 15 min prior to the visit.

## 2019-02-16 ENCOUNTER — Ambulatory Visit (INDEPENDENT_AMBULATORY_CARE_PROVIDER_SITE_OTHER): Payer: 59 | Admitting: Neurology

## 2019-02-16 ENCOUNTER — Other Ambulatory Visit: Payer: Self-pay

## 2019-02-16 ENCOUNTER — Encounter: Payer: Self-pay | Admitting: Neurology

## 2019-02-16 ENCOUNTER — Ambulatory Visit (INDEPENDENT_AMBULATORY_CARE_PROVIDER_SITE_OTHER): Payer: 59 | Admitting: *Deleted

## 2019-02-16 DIAGNOSIS — G932 Benign intracranial hypertension: Secondary | ICD-10-CM

## 2019-02-16 DIAGNOSIS — E669 Obesity, unspecified: Secondary | ICD-10-CM | POA: Diagnosis not present

## 2019-02-16 DIAGNOSIS — E66811 Obesity, class 1: Secondary | ICD-10-CM

## 2019-02-16 DIAGNOSIS — E103593 Type 1 diabetes mellitus with proliferative diabetic retinopathy without macular edema, bilateral: Secondary | ICD-10-CM | POA: Diagnosis not present

## 2019-02-16 DIAGNOSIS — J4550 Severe persistent asthma, uncomplicated: Secondary | ICD-10-CM | POA: Diagnosis not present

## 2019-02-16 DIAGNOSIS — J4541 Moderate persistent asthma with (acute) exacerbation: Secondary | ICD-10-CM | POA: Diagnosis not present

## 2019-02-16 DIAGNOSIS — Z6832 Body mass index (BMI) 32.0-32.9, adult: Secondary | ICD-10-CM

## 2019-02-16 MED ORDER — ALPRAZOLAM 0.25 MG PO TABS: 0.2500 mg | ORAL_TABLET | Freq: Every evening | ORAL | 0 refills | Status: DC | PRN

## 2019-02-16 NOTE — Progress Notes (Signed)
Virtual Visit via Video Note  I connected with [name] on 02/16/19 at  2:00 PM EDT by a video enabled telemedicine application and verified that I am speaking with the correct person using two identifiers.   I discussed the limitations of evaluation and management by telemedicine and the availability of in person appointments. The patient expressed understanding and agreed to proceed.  History of Present Illness: pseudotumour cerebri, brittle diabetes, asthma.    This 38 year old patient with type 1 DM  Had been referred to the sleep clinic, had been ordered to have a Split night sleep study here on 03-25-2018, but the study never took place. Headaches, asthma and retinal changes continued to progress.  I obtained an opening pressure for CSF, which returned high, he was relived of some pressure felt better, but could not tolerate  DIAMOX, which created HYPERVENTILATION ( Dr. Lucie Leather) , respiratory alkalosis,  and related nausea,  trembling, tingling- panic attacks.  A Gastric emptying study was negative for gastroparesis. Diamex was d/c and Topamax/ topiramate started.  Repeat CSF testing result again with elevated opening pressure.  Change to Topiramate was not successful, as it did not keep CSF pressure down at 50 mg dose, higher dose allowed retro-orbital  pressure sensation , trembling, hyperventilation again.  He just started on TPM 50 mg bid, too early to see effect.  He will call me back on Friday morning, and give report.  If not effective, will use smaller dose of diamox 500 mg and 50 mg TPM in combination. He also can take alprazolam at night, helped for anxiety, insomnia, panic disorder.   Melvyn Novas, MD     Observations/Objective: uses TP bid and 500 mg Diamox at lunchtime daily.  Has a lot of bathroom breaks, but no UTI.  Urologist evaluated.  Respiratory Alkalosis improved, tremor improved. Some SOB with activity. Last LP was performed on 01-03-2019, followed  by ongoing headaches.  He reports these have decreased in intensity but not frequency , or he may have gotten used to these.    Assessment and Plan: Ophthalmologist report reviewed. Telephone conversation with Dr Lucie Leather reviewed.   Please continue the current dose of diamox and topiramate as this has been best tolerate so far. Pseudotumor cerebri was confirmed in opening pressure.      Follow Up Instructions: Rv in person 3 month from now.   We can try diamox 500 mg at lunch alternating with 1000 mg on monday, wednesday, friday.  I will need to look at Co2, Ph, CMET. RV in face to face in June or earlier if dr Luciana Axe can advance his appointment.   Iike for him to have a follow up Dr. Luciana Axe  - June 22 -2020    I discussed the assessment and treatment plan with the patient. The patient was provided an opportunity to ask questions and all were answered. The patient agreed with the plan and demonstrated an understanding of the instructions.   The patient was advised to call back or seek an in-person evaluation if the symptoms worsen or if the condition fails to improve as anticipated.  I provided 18 minutes of non-face-to-face time during this encounter.   Melvyn Novas, MD  02-16-2019

## 2019-02-16 NOTE — Patient Instructions (Signed)
Acetazolamide tablets What is this medicine? ACETAZOLAMIDE (a set a ZOLE a mide) is used to treat glaucoma and some seizure disorders. It may be used to treat edema or swelling from heart failure or from other medicines. This medicine is also used to treat and to prevent altitude or mountain sickness. This medicine may be used for other purposes; ask your health care provider or pharmacist if you have questions. COMMON BRAND NAME(S): Diamox What should I tell my health care provider before I take this medicine? They need to know if you have any of these conditions: -diabetes -kidney disease -liver disease -lung disease -an unusual or allergic reaction to acetazolamide, sulfa drugs, other medicines, foods, dyes, or preservatives -pregnant or trying to get pregnant -breast-feeding How should I use this medicine? Take this medicine by mouth with a glass of water. Follow the directions on the prescription label. Take this medicine with food if it upsets your stomach. Take your doses at regular intervals. Do not take your medicine more often than directed. Do not stop taking except on your doctor's advice. Talk to your pediatrician regarding the use of this medicine in children. Special care may be needed. Patients over 21 years old may have a stronger reaction and need a smaller dose. Overdosage: If you think you have taken too much of this medicine contact a poison control center or emergency room at once. NOTE: This medicine is only for you. Do not share this medicine with others. What if I miss a dose? If you miss a dose, take it as soon as you can. If it is almost time for your next dose, take only that dose. Do not take double or extra doses. What may interact with this medicine? Do not take this medicine with any of the following medications: -methazolamide This medicine may also interact with the following medications: -aspirin and aspirin-like  medicines -cyclosporine -lithium -medicine for diabetes -methenamine -other diuretics -phenytoin -primidone -quinidine -sodium bicarbonate -stimulant medicines like dextroamphetamine This list may not describe all possible interactions. Give your health care provider a list of all the medicines, herbs, non-prescription drugs, or dietary supplements you use. Also tell them if you smoke, drink alcohol, or use illegal drugs. Some items may interact with your medicine. What should I watch for while using this medicine? Visit your doctor or health care professional for regular checks on your progress. You will need blood work done regularly. If you are diabetic, check your blood sugar as directed. You may need to be on a special diet while taking this medicine. Ask your doctor. Also, ask how many glasses of fluid you need to drink a day. You must not get dehydrated. You may get drowsy or dizzy. Do not drive, use machinery, or do anything that needs mental alertness until you know how this medicine affects you. Do not stand or sit up quickly, especially if you are an older patient. This reduces the risk of dizzy or fainting spells. This medicine can make you more sensitive to the sun. Keep out of the sun. If you cannot avoid being in the sun, wear protective clothing and use sunscreen. Do not use sun lamps or tanning beds/booths. What side effects may I notice from receiving this medicine? Side effects that you should report to your doctor or health care professional as soon as possible: -allergic reactions like skin rash, itching or hives, swelling of the face, lips, or tongue -breathing problems -confusion, depression -dark urine -fever -numbness, tingling in hands or feet -  redness, blistering, peeling or loosening of the skin, including inside the mouth -ringing in the ears -seizures -unusually weak or tired -yellowing of the eyes or skin Side effects that usually do not require medical  attention (report to your doctor or health care professional if they continue or are bothersome): -change in taste -diarrhea -headache -loss of appetite -nausea, vomiting -passing urine more often This list may not describe all possible side effects. Call your doctor for medical advice about side effects. You may report side effects to FDA at 1-800-FDA-1088. Where should I keep my medicine? Keep out of the reach of children. Store at room temperature between 20 and 25 degrees C (68 and 77 degrees F). Throw away any unused medicine after the expiration date. NOTE: This sheet is a summary. It may not cover all possible information. If you have questions about this medicine, talk to your doctor, pharmacist, or health care provider.  2019 [employer]/Gold Standard (2008-01-05 10:59:40)

## 2019-02-17 ENCOUNTER — Telehealth: Payer: Self-pay | Admitting: Neurology

## 2019-02-17 NOTE — Telephone Encounter (Signed)
Patient is seeing Dr. Luciana Axe Monday 02/21/2019 at 8:15 Patient is aware.

## 2019-02-22 ENCOUNTER — Telehealth: Payer: Self-pay | Admitting: Neurology

## 2019-02-22 NOTE — Telephone Encounter (Signed)
LVM to schedule 3 month follow-up per Dr. Vickey Huger.

## 2019-02-24 ENCOUNTER — Other Ambulatory Visit (INDEPENDENT_AMBULATORY_CARE_PROVIDER_SITE_OTHER): Payer: Self-pay | Admitting: "Endocrinology

## 2019-02-28 ENCOUNTER — Ambulatory Visit (INDEPENDENT_AMBULATORY_CARE_PROVIDER_SITE_OTHER): Payer: 59 | Admitting: "Endocrinology

## 2019-03-02 ENCOUNTER — Telehealth: Payer: Self-pay | Admitting: *Deleted

## 2019-03-02 NOTE — Telephone Encounter (Signed)
Called patient and advised per [facility] will need to switch his Nucala over to autoinjector and will be sending new rx to [facility] pharmacy for same.  Instructed to contact office and bring autoinjector in for initial injection and instruction

## 2019-03-15 ENCOUNTER — Ambulatory Visit: Payer: 59 | Admitting: Allergy and Immunology

## 2019-03-15 ENCOUNTER — Other Ambulatory Visit: Payer: Self-pay

## 2019-03-15 ENCOUNTER — Telehealth (INDEPENDENT_AMBULATORY_CARE_PROVIDER_SITE_OTHER): Payer: 59 | Admitting: Allergy and Immunology

## 2019-03-15 DIAGNOSIS — F458 Other somatoform disorders: Secondary | ICD-10-CM

## 2019-03-15 DIAGNOSIS — J3089 Other allergic rhinitis: Secondary | ICD-10-CM | POA: Diagnosis not present

## 2019-03-15 DIAGNOSIS — B370 Candidal stomatitis: Secondary | ICD-10-CM | POA: Diagnosis not present

## 2019-03-15 DIAGNOSIS — J4550 Severe persistent asthma, uncomplicated: Secondary | ICD-10-CM

## 2019-03-15 DIAGNOSIS — K219 Gastro-esophageal reflux disease without esophagitis: Secondary | ICD-10-CM | POA: Diagnosis not present

## 2019-03-15 NOTE — Patient Instructions (Signed)
  1. Continue immunotherapy and Auvi-Q  2. Continue to Treat and prevent inflammation:   A. Asmanex 200 -2 puffs 1-2 times a day with a spacer   B. montelukast 10 mg tablet once a day  C. Budesonide 0.5 mg in 100 ml nasal saline 1-7 times per week   D. Mepolizumab every 4 weeks   3. Treat reflux:   A.  Omeprazole OTC mg tablet 1 time per day  4. Continue nystatin oral solution 5 mL swish and swallow every day    5. If needed:   A. nasal saline / wash  B. OTC antihistamine  C. Proair HFA / Xopenex 2 inhalations or albuterol every 4-6 hours  6. Use Rebreathing technique if needed  7. Return in 12 weeks or earlier if problem

## 2019-03-15 NOTE — Progress Notes (Addendum)
[place]   Follow-up Note  Referring Provider: Lucila MaineSpencer, [name], PA-[name] Primary Provider: Lucila MaineSpencer, [name], PA-[name] Date of Office Visit: 03/15/2019  Subjective:   [name] (DOB: 07/15/1981) is a 38 y.o. male who [facility] in re-evaluation of the following:  HPI: This is a E - med visit requested by patient who is located at home.  [name] is followed in this clinic for asthma and allergic rhinitis and reflux and possible Acetazolamide side effect.  His last visit to this clinic was 21 December 2018.  Acetazolamide increased secondary to increased CNS pressure. Feels that this medication was contributing to breathing problem and nausea. Decreased hyperventilation and no need for rebreathing technique with lower dose use. Still some SOB intermittently but much better.   Rare use of albuterol. Can go a week without using albuterol. Getting ready to use exercise bike. Using Asmanex only one time per day. Using Nystatin about every 3 days which is working. Mepolizumab going OK without adverse effect.   No need for nasal budesonide washes.   Reflux under control with Omeperazole. Has decreased weight by 18 lbs.  Allergies as of 03/15/2019      Reactions   Flonase [fluticasone] Shortness Of Breath   Molds & Smuts Other (See Comments)   Aggravate asthma   Sulfites Itching, Swelling   Tetracyclines & Related Other (See Comments)   increased intracranial pressure      Medication List      acetaZOLAMIDE 500 MG capsule Commonly known as:  DIAMOX 750 mg.   albuterol 108 (90 Base) MCG/ACT inhaler Commonly known as:  ProAir HFA Inhale 2 puffs into the lungs every 6 (six) hours as needed for wheezing or shortness of breath.   albuterol (2.5 MG/3ML) 0.083% nebulizer solution Commonly known as:  PROVENTIL Take 3 mLs (2.5 mg total) by nebulization every 4 (four) hours as needed for wheezing  or shortness of breath.   ALPRAZolam 0.25 MG tablet Commonly known as:  Xanax Take 1 tablet (0.25 mg total) by mouth at bedtime as needed for anxiety.   Asmanex HFA 200 MCG/ACT Aero Generic drug:  Mometasone Furoate INHALE 2 PUFFS INTO THE LUNGS TWICE DAILY   atorvastatin 20 MG tablet Commonly known as:  LIPITOR   Auvi-Q 0.3 mg/0.3 mL Soaj injection Generic drug:  EPINEPHrine Use as directed for life-threatening allergic reaction.   clonazePAM 0.5 MG tablet Commonly known as:  KLONOPIN Take 0.5 mg by mouth 2 (two) times daily as needed.   glucagon 1 MG injection Follow package directions for low blood sugar.   HumaLOG 100 UNIT/ML injection Generic drug:  insulin lispro INJECT 300 UNITS UNDER THE SKIN VIA INSULIN PUMP EVERY 48 TO 72 HOURS PER HYPERGLYCEMIA AND DKA PROTOCOLS   losartan 50 MG tablet Commonly known as:  COZAAR TAKE 1 TABLET(50 MG) BY MOUTH DAILY   montelukast 10 MG tablet Commonly known as:  SINGULAIR TAKE 1 TABLET(10 MG) BY MOUTH AT BEDTIME   nystatin 100000 UNIT/ML suspension Commonly known as:  MYCOSTATIN Swish and swallow 5 mls daily   omeprazole 20 MG capsule Commonly known as:  PRILOSEC Take 20 mg by mouth daily.   ONE TOUCH ULTRA TEST test strip Generic drug:  glucose blood USE TO CHECK BLOOD SUGAR SIX TIMES DAILY   topiramate 50 MG tablet Commonly known as:  Topamax Take 1 tablet (50 mg total) by mouth 2 (two) times  daily.   TYLENOL 500 MG tablet Generic drug:  acetaminophen Take 1,000 mg by mouth 2 (two) times daily as needed.       Past Medical History:  Diagnosis Date  . ADHD (attention deficit hyperactivity disorder)   . Autonomic neuropathy due to diabetes (HCC)   . Diabetes (HCC)   . Fatigue   . Goiter   . High blood pressure   . High cholesterol   . Hypercholesterolemia   . Hypertension   . Hypoglycemia associated with diabetes (HCC)   . Obesity   . Tachycardia   . Type 1 diabetes mellitus not at goal [facility])   .  Uncontrolled DM with microalbuminuria or microproteinuria     Past Surgical History:  Procedure Laterality Date  . REFRACTIVE SURGERY     x9  . WISDOM TOOTH EXTRACTION      Review of systems negative except as noted in HPI / PMHx or noted below:  Review of Systems  Constitutional: Negative.   HENT: Negative.   Eyes: Negative.   Respiratory: Negative.   Cardiovascular: Negative.   Gastrointestinal: Negative.   Genitourinary: Negative.   Musculoskeletal: Negative.   Skin: Negative.   Neurological: Negative.   Endo/Heme/Allergies: Negative.   Psychiatric/Behavioral: Negative.      Objective:   There were no vitals filed for this visit.        Physical Exam-deferred  Diagnostics: none  Assessment and Plan:   1. Asthma, severe persistent, well-controlled   2. Perennial allergic rhinitis   3. Gastroesophageal reflux disease, esophagitis presence not specified   4. Oral candidiasis   5. Hyperventilation syndrome     1. Continue immunotherapy and Auvi-Q  2. Continue to Treat and prevent inflammation:   A. Asmanex 200 -2 puffs 1-2 times a day with a spacer   B. montelukast 10 mg tablet once a day  [name]. Budesonide 0.5 mg in 100 ml nasal saline 1-7 times per week   D. Mepolizumab every 4 weeks   3. Treat reflux:   A.  Omeprazole OTC mg tablet 1 time per day  4. Continue nystatin oral solution 5 mL swish and swallow every day    5. If needed:   A. nasal saline / wash  B. OTC antihistamine  [name]. Proair HFA / Xopenex 2 inhalations or albuterol every 4-6 hours  6. Use Rebreathing technique if needed  7. Return in 12 weeks or earlier if problem    Overall, [name] sounds like he is doing well on his current plan of anti-inflammatory agents for his airway which includes Mepolizumab injections and therapy directed against reflux. He will remain on the plan noted above and he will return to this clinic in 12 weeks or earlier if he has any problems.  Total patient  interaction time 20 minutes.  Joseph Schimke, MD Allergy / Immunology [facility]

## 2019-03-16 ENCOUNTER — Other Ambulatory Visit: Payer: Self-pay

## 2019-03-16 ENCOUNTER — Encounter: Payer: Self-pay | Admitting: Allergy and Immunology

## 2019-03-16 ENCOUNTER — Ambulatory Visit: Payer: 59 | Admitting: *Deleted

## 2019-03-16 DIAGNOSIS — J4550 Severe persistent asthma, uncomplicated: Secondary | ICD-10-CM

## 2019-03-16 NOTE — Progress Notes (Signed)
Immunotherapy   Patient Details  Name: [name] MRN: [id number] Date of Birth: October 24, 1981  03/16/2019  [name] started injections for  Nucala Autoinjector 100 mg/ml. Patient demonstrated that he could give himself an injection. Patient will continue to give himself injections every 28 days at home. Epi-Pen:Epi-Pen Available  Consent signed and patient instructions given.   Mariane Duval 03/16/2019, 11:21 AM

## 2019-03-22 NOTE — Progress Notes (Signed)
Mychart video visit. Provider is in office. Start Time: 321 End Time: 352

## 2019-03-26 ENCOUNTER — Other Ambulatory Visit: Payer: Self-pay | Admitting: Allergy and Immunology

## 2019-03-26 DIAGNOSIS — Z91018 Allergy to other foods: Secondary | ICD-10-CM

## 2019-03-26 DIAGNOSIS — J3089 Other allergic rhinitis: Secondary | ICD-10-CM

## 2019-03-26 DIAGNOSIS — H6983 Other specified disorders of Eustachian tube, bilateral: Secondary | ICD-10-CM

## 2019-04-03 ENCOUNTER — Other Ambulatory Visit: Payer: Self-pay | Admitting: Neurology

## 2019-04-12 ENCOUNTER — Ambulatory Visit: Payer: 59 | Admitting: Sports Medicine

## 2019-04-12 ENCOUNTER — Ambulatory Visit: Payer: Self-pay

## 2019-04-12 ENCOUNTER — Other Ambulatory Visit: Payer: Self-pay

## 2019-04-12 VITALS — BP 110/70 | Ht 72.0 in | Wt 229.0 lb

## 2019-04-12 DIAGNOSIS — M25512 Pain in left shoulder: Secondary | ICD-10-CM

## 2019-04-12 DIAGNOSIS — M25532 Pain in left wrist: Secondary | ICD-10-CM

## 2019-04-12 DIAGNOSIS — S20221A Contusion of right back wall of thorax, initial encounter: Secondary | ICD-10-CM | POA: Diagnosis not present

## 2019-04-12 NOTE — Assessment & Plan Note (Signed)
Patient does not have rotator cuff pathology on exam or on ultrasound.  Patient was encouraged to do shoulder mobility exercises to increase his range of motion and decrease the chance of reduced range of motion due to this injury.

## 2019-04-12 NOTE — Assessment & Plan Note (Signed)
And appears to have some bruising from his injury but no other damage to this area.  Patient was counseled to incorporate balance exercises each day to combat some of his balance issues due to his diabetic neuropathy and pseudotumor cerebri.  He was counseled on how to perform these exercises.  He was encouraged to continue using ice and NSAIDs if needed and that his pain should improve with time.

## 2019-04-12 NOTE — Assessment & Plan Note (Signed)
No fracture noted on ultrasound.  Patient was reassured that this will likely improve with time as well and was encouraged to continue icing the area as needed.

## 2019-04-12 NOTE — Progress Notes (Signed)
Subjective:    [name] - 38 y.o. male MRN [id number]  Date of birth: 02/17/1981  CC:  [name] is here for left shoulder and right hip pain due to a recent fall  HPI: Patient reports that on Saturday, 6/13, he was walking down the stairs when he slipped and fell, landing on the right side of his back.  He tried to catch him self on the railing to the left, causing his left shoulder to be strained and causing an abrasion on his left wrist.  He notes that he had some swelling and bruising on the right lumbar area of his back, and his left shoulder has some pain with movement.  He also reports that he has had bilateral wrist pain from the impact.  He has been using ice and ibuprofen to help with the symptoms, although these interventions have been minimally helpful.  He denies any radicular pain down his posterior legs.  He also denies feeling any imbalance or paresthesias prior to his fall and does not think that these issues caused his injury.  He came in today because he wanted to make sure he did not have a fracture in his wrists or a tear in his shoulder.  Health Maintenance:  Health Maintenance Due  Topic Date Due  . PNEUMOCOCCAL POLYSACCHARIDE VACCINE AGE 74-64 HIGH RISK  02/01/1984  . HIV Screening  01/31/1997  . TETANUS/TDAP  01/31/2001  . FOOT EXAM  04/28/2014    -  reports that he has never smoked. He has never used smokeless tobacco. - Review of Systems: Per HPI. - Past Medical History: Patient Active Problem List   Diagnosis Date Noted  . Contusion of back, right, initial encounter 04/12/2019  . Left shoulder pain 04/12/2019  . Acute pain of left wrist 04/12/2019  . Pseudotumor cerebri syndrome 02/16/2019  . Class 1 obesity with serious comorbidity and body mass index (BMI) of 32.0 to 32.9 in adult 02/16/2019  . Idiopathic normal pressure hydrocephalus (HCC) 12/15/2018  . Severe persistent asthma with acute exacerbation 08/31/2018  . Seasonal and  perennial allergic rhinitis 08/31/2018  . Mild intermittent asthma without complication 07/12/2018  . Controlled type 1 diabetes mellitus with both eyes affected by proliferative retinopathy without macular edema (HCC) 06/09/2018  . Sore throat 04/26/2018  . Moderate persistent asthma with acute exacerbation 04/26/2018  . Other allergic rhinitis 04/26/2018  . Gastroesophageal reflux disease 04/26/2018  . Food allergy 04/26/2018  . Pseudotumor cerebri 03/25/2018  . Morbid obesity with BMI of 40.0-44.9, adult (HCC) 03/25/2018  . Altitude sickness, subsequent encounter 03/25/2018  . Encounter for medication management 03/25/2018  . Snoring 03/25/2018  . Increased intracranial pressure 03/20/2018  . Low back pain 03/10/2018  . Cervicalgia of occipito-atlanto-axial region 08/31/2017  . Type 1 diabetes mellitus with complication (HCC) 08/31/2017  . Calcified lymph nodes 06/04/2017  . Insulin pump in place 05/28/2017  . Neuropathy 05/28/2017  . 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 Other symptoms and signs involving the nervous system 05/28/2017  . Nightmares REM-sleep type 05/28/2017  . Proliferative diabetic retinopathy, right eye (HCC) 04/11/2017  . Benign paroxysmal positional vertigo 03/29/2017  . Costochondritis 03/06/2017  . Acute upper respiratory infection 03/06/2017  . Combined hyperlipidemia 02/11/2016  . Metatarsalgia of right foot 02/07/2016  . Lactose intolerance 01/07/2016  . Inappropriate sinus tachycardia 06/29/2015  . Thoracic back pain 11/02/2013  . Type 1 diabetes mellitus not at goal [facility])   .  Goiter   . Severe obesity (BMI 35.0-39.9) with comorbidity (HCC)   . Hypercholesterolemia   . ADHD (attention deficit hyperactivity disorder)   . Fatigue   . Tachycardia   . Autonomic neuropathy associated with type 1 diabetes mellitus (HCC)   . Hypertension   . Uncontrolled DM with microalbuminuria or  microproteinuria   . Hypoglycemia associated with diabetes (HCC)   . DIABETES MELLITUS, I 12/24/2006  . ATTENTION DEFICIT, W/HYPERACTIVITY 12/24/2006   - Medications: reviewed and updated   Objective:   Physical Exam BP 110/70   Ht 6' (1.829 m)   Wt 229 lb (103.9 kg)   BMI 31.06 kg/m  Gen: NAD, alert, cooperative with exam, obese, pleasant Musculoskeletal: Sacrum:  Some swelling and mild bruising noted in the area to the right of the sacrum.  He is tender in this area, but has no point tenderness on his spine.  Lumbar ROM is normal.  Straight leg test is normal bilaterally, hip ROM is normal bilaterally, FABER and FADIR normal bilaterally.  Neurovascularly intact. Left shoulder: No visual deformity.  Mildly tender to palpation around the biceps tendon.  Normal ROM, although patient reports some pain and stiffness on elevation, abduction, and internal rotation.  Speeds and Yergason testing are normal.  Strength is 5/5 on internal and external rotation, empty can.  AC joint stability is normal.  Neurovascularly intact. Right shoulder: No visual deformity.  Nontender to palpation.  Normal range of motion.  Normal speeds, Yergason, strength 5/5 on internal and external rotation, empty can testing.  Neurovascularly intact. Right wrist: No swelling or bruising noted, but tender to palpation.  Neurovascularly intact. Left wrist: Small abrasion noted on anterior ulnar side of distal forearm.  Tender to palpation.  Neurovascularly intact. Functional testing: Imbalance noted while standing on 1 foot bilaterally.  Normal ability on sit to standing test without using upper body for assistance.  Ultrasound Left Shoulder  BT short: normal appearance, no tears, no tenosynovitis BT long: normal appearance, intact without tears Supraspinatus tendon: normal appearance, no tears of degenerative changes, no abnormal fluid within the bursa Subscapularis tendon: normal appearance without tears or degenerative  changes Infraspinatus tendon:  normal appearance without tears or degenerative changes Teres Minor tendon:  normal appearance without tears or degenerative changes GH joint: normal appearing posterior labrum AC joint: no  degenerative changes, no geyser sign  Summary and Additional findings- normal appearing left shoulder  [place] Left Wrist  Distal ulna appears normal with no signs of fracture.  Some fluid accumulation is noted around the soft tissue above ulna at the site of injury.  Ultrasound and interpretation by Sibyl Parr. Dusten Ellinwood, MD       Assessment & Plan:   Contusion of back, right, initial encounter And appears to have some bruising from his injury but no other damage to this area.  Patient was counseled to incorporate balance exercises each day to combat some of his balance issues due to his diabetic neuropathy and pseudotumor cerebri.  He was counseled on how to perform these exercises.  He was encouraged to continue using ice and NSAIDs if needed and that his pain should improve with time.  Left shoulder pain Patient does not have rotator cuff pathology on exam or on ultrasound.  Patient was encouraged to do shoulder mobility exercises to increase his range of motion and decrease the chance of reduced range of motion due to this injury.  Acute pain of left wrist No fracture noted on ultrasound.  Patient was  reassured that this will likely improve with time as well and was encouraged to continue icing the area as needed.    Lezlie Octave, M.D. 04/12/2019, 11:17 AM PGY-2, [facility]  I observed and examined the patient with the resident and agree with assessment and plan.  Note reviewed and modified by me. Sterling Big, MD

## 2019-04-15 ENCOUNTER — Ambulatory Visit (INDEPENDENT_AMBULATORY_CARE_PROVIDER_SITE_OTHER): Payer: 59 | Admitting: "Endocrinology

## 2019-04-26 ENCOUNTER — Ambulatory Visit (INDEPENDENT_AMBULATORY_CARE_PROVIDER_SITE_OTHER): Payer: 59 | Admitting: "Endocrinology

## 2019-04-26 ENCOUNTER — Encounter (INDEPENDENT_AMBULATORY_CARE_PROVIDER_SITE_OTHER): Payer: Self-pay | Admitting: "Endocrinology

## 2019-04-26 ENCOUNTER — Other Ambulatory Visit: Payer: Self-pay

## 2019-04-26 VITALS — BP 108/74 | HR 90 | Wt 230.4 lb

## 2019-04-26 DIAGNOSIS — E1043 Type 1 diabetes mellitus with diabetic autonomic (poly)neuropathy: Secondary | ICD-10-CM

## 2019-04-26 DIAGNOSIS — K3184 Gastroparesis: Secondary | ICD-10-CM

## 2019-04-26 DIAGNOSIS — E11649 Type 2 diabetes mellitus with hypoglycemia without coma: Secondary | ICD-10-CM | POA: Diagnosis not present

## 2019-04-26 DIAGNOSIS — R000 Tachycardia, unspecified: Secondary | ICD-10-CM

## 2019-04-26 DIAGNOSIS — E049 Nontoxic goiter, unspecified: Secondary | ICD-10-CM

## 2019-04-26 DIAGNOSIS — E1065 Type 1 diabetes mellitus with hyperglycemia: Secondary | ICD-10-CM

## 2019-04-26 DIAGNOSIS — E063 Autoimmune thyroiditis: Secondary | ICD-10-CM

## 2019-04-26 DIAGNOSIS — IMO0001 Reserved for inherently not codable concepts without codable children: Secondary | ICD-10-CM

## 2019-04-26 DIAGNOSIS — E1143 Type 2 diabetes mellitus with diabetic autonomic (poly)neuropathy: Secondary | ICD-10-CM

## 2019-04-26 DIAGNOSIS — F0631 Mood disorder due to known physiological condition with depressive features: Secondary | ICD-10-CM

## 2019-04-26 DIAGNOSIS — I10 Essential (primary) hypertension: Secondary | ICD-10-CM

## 2019-04-26 LAB — POCT GLYCOSYLATED HEMOGLOBIN (HGB A1C): Hemoglobin A1C: 6.8 % — AB (ref 4.0–5.6)

## 2019-04-26 LAB — POCT GLUCOSE (DEVICE FOR HOME USE): Glucose Fasting, POC: 230 mg/dL — AB (ref 70–99)

## 2019-04-26 NOTE — Progress Notes (Signed)
Subjective:  Patient Name: [name] Date of Birth: 1981/01/01  MRN: [id number]  [name]  presents to the office today for follow-up of his type 1 diabetes mellitus, goiter, obesity, combined hyperlipidemia, adult ADHD, fatigue, autonomic neuropathy, tachycardia, hypertension, microalbuminuria, hypoglycemia, sinusitis, bronchitis, allergies, proliferative retinopathy, and pseudotumor cerebri/increased intracranial hypertension.   HISTORY OF PRESENT ILLNESS:   [name] is a 38 y.o. Caucasian gentleman. [name] was unaccompanied.  1. The patient was first referred to me on 01/08/2006 by his family physician, Dr. Roanna Epley, for evaluation and management of type 1 diabetes mellitus and related problems. The patient was 38 years old.  A. [name] had been diagnosed with type 1 diabetes somewhere in 2001-2002, at the age of 26-19. He was initially diagnosed with type 2 diabetes mellitus, but was later re-classified as type 1 diabetes mellitus. He had been followed by Dr. Arther Dames, staff endocrinologist at [facility]. [name] was started on an insulin pump approximately 18 months prior to first seeing me. His pump was a Medtronic Paradigm 712. His blood glucose control was fair-to-poor. He was frequently not checking blood sugars or taking insulin boluses as often as he needed to. He frequently noted fast heart rate. The patient's past medical history was positive for hypertension, for which he was taking lisinopril, and for what his mother called "extreme ADHD". He had previously been taking ADHD medicines but had discontinued them due to adverse effects. He had prior ankle injuries and knee injuries, to include tears of the lateral menisci bilaterally. He had not had any surgeries. He was working for a [employer] that had many different job sites in different parts of the [place] and in other countries as well. 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 He did not use tobacco or drugs, but did drink alcohol occasionally.  B. On physical examination, his weight was 234 pounds, his height was 70 inches, his BMI was 33.2. Blood pressure was 128/70. Hemoglobin A1c was 9.5%. Heart rate was 96. He had normal affect and fair insight. He had a 25-30 gram goiter. He had normal 1+ DP pulses in his feet. He had normal sensation in his feet to touch, vibration, and monofilament. His CMP was normal except for glucose of 251. His cholesterol was 257, triglycerides 143, HDL 49, and LDL 179. TSH was 1.017, free T4 was 1.06, and free T3 was 3.3. His urinary microalbumin: creatinine ratio was 20.1 (normal less than 30).   C. The patient clearly needed better blood glucose control, which would only occur if he had better adherence to his plan. He appeared to have autonomic neuropathy, manifested by inappropriate sinus tachycardia.  His thyroid goiter suggested that he might have evolving Hashimoto's disease. He stated that he had lost some weight recently. I encouraged him to check his blood sugars more frequently and to take both correction boluses and food boluses.  2. During the past twelve years, [name] has had to deal with many different problems:  A. T1DM/hypoglycemia:    1). 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 His hemoglobin A1c values have varied from 8.3-11.2%.   2). After starting him on a Medtronic 760G insulin pump and Guardian 3 sensor on 01/12/17, his BG control has improved significantly, despite the illnesses described below.   B.  DM complications: .   1). He has bilateral diabetic retinopathy and is followed by his retinologist, Dr. Verdell Carmine, MD.   2). He has autonomic neuropathy manifested by inappropriate sinus tachycardia and gastroparesis causing postprandial bloating and GERD. These  problems have varied inversely with his degree of BG control.   C. Combined hyperlipidemia:     1). On 12/03/10 his total cholesterol was 270, triglycerides 94, HDL 50, and LDL 201. I started him on Crestor, 10 mg per day. He has since begun treatment with atorvastatin and his cholesterol values had improved.    2). On 12/29/16 his lipids had improved, but his cholesterol values were still elevated.    D. Allergies, sinusitis, and bronchitis:   1). Unfortunately, beginning in about June 2018, [name] began to develop recurrent episodes of allergic rhinitis, head fullness, sinusitis, and bronchitis. He also developed headaches, muscle pains and spasms, lethargy, fatigue, and weakness that were presumably due in large part to severe allergies. His atorvastatin and several other medications were stopped at that time. Despite receiving excellent care from his allergist, to include allergy injections and biologic injections, [name]'s allergic issues have persisted.   E. Increased intracranial pressure:    1). [name] was seen in consultation by Dr. Melvyn Novas, neurology, on 05/28/17 for headaches, tremors of his right hand and foot, shaky vision, and symptoms c/w OSA. His initial sleep study did not show any significant amount of OSA.    2). On 03/08/18, Dr.Dohmeier was notified that [name]'s eye doctor had seen signs of fluid build up in his right eye. An LP was performed in radiology on 03/09/18. Opening pressure was 28 cm of water, c/w increased intracranial pressure and pseudotumor cerebri. This problem was initially though to be due to tetracycline usage, so TCN was discontinued and Diamox begun. The Diamox doses have been changed over time. Many of his neuromuscular symptoms improved.   G. Adult ADD/depression: [name] was evaluated by Dr Piedad Climes, a noted neuropsychiatrist in [place] in 2018. At his last follow up visit in November 2018, Dr. Shane Crutch decided to wait until [name]'s overall health situation   stabilized before trying any new medications.   H. General physical and emotional health: [name] has been very sick for much of the past 2 years. He has been unable to work full-time and has become almost confined to his home. He has become increasingly anhedonic over time.    3. [name]'s last [facility] visit was on 12/27/2018. At that visit we continued his insulin pump settings.    A. In the interim he has been fairly healthy and is feeling better overall. His allergies have been acting up recently.  B. He has talked with his therapist, Ms. [facility], every week. She recommended starting alprazolam, 0.25 mg at bedtime when needed for anxiety.Dr. Vickey Huger prescribed the medication for him.  He thinks the medication and the therapy are helping him.   C. On 01/03/19 he had an LP that showed elevated pressure. His diamox dose was increased somewhat. Dr. Vickey Huger also started him on Topamax. He had a follow up visit with Dr. Vickey Huger on 02/16/19. She continued him on low doses of both medications. He still has some adverse effects of the Diamox, but not as bad  D. He was treated with nitrofurantoin for acute cystitis on 01/28/19.  E. He saw Dr. Luciana Axe in May 2020. There were no new signs of diabetic eye disease.   F. He saw Dr. Sharyn Lull on 03/15/19. Ginette Pitman was  noted and treated.   G. His nausea now occurs about every two weeks, but is much better on omeprazole.   H. He has not had any allergy injections for 4-5 months, but has had his monthly biologic injection.    I. He continues to have problems with lactose intolerance. When he avoids lactose, however, he is asymptomatic. Some foods such as shellfish cause his throat to swell up.   J. He uses Humalog aspart insulin in his insulin pump. He takes losartan, 50 mg/day. He rarely has any coughing unless his allergies act up. .    K. His second sleep study was not bad enough to start on C-pap.    L. His BGs have varied with his illnesses, with steroid usage, and with  the amount of postprandial bloating and constipation that he has. He no longer has to delay his boluses as often, because he is not as worried that his gastric emptying will be too delayed and cause hypoglycemia.     4. Pertinent Review of Systems: Constitutional: [name] feels "better than he did three months ago". "This is the longest I've gone without being sick." He is still tired, but not as much. He is still often sore and achy, but not as much. Headaches are "not too bad". The visual color changes, muscle spasms, and tingling since that were attributed to Diamox now bother his only "occasionally" since reducing the Diamox dose.  Eyes: As above. His vision is usually good with his glasses. His eyes tire easily. .  Neck: He has not had any intermittent anterior neck swelling and tenderness since his last visit.  Lungs: Breathing has been "pretty good", but he still sometimes has SOB with exertion.  Heart: No problems Gastrointestinal: As above. He does not have much postprandial bloating or reflux. The patient has no other complaints of excessive hunger, heartburn, upset stomach, stomach aches or pains, or diarrhea while taking Prilosec, 10 mg, daily.  Legs: As above. He occasionally still has the sensation of vibration in his legs, but less often, perhaps only about once a month. These sensations may last for up to half a day. Muscle mass and strength seem normal. There are no other complaints of numbness, tingling, burning, or pain. No edema is noted. Feet: There are no obvious foot problems now. There are no complaints of numbness, burning, or pain.  No edema is noted. Hypoglycemia: He rarely has low BGs.  Emotional: "It varies, but probably better".   5. BG printout: We have data from the past 2 weeks. [name] changes sites every 2-4 days. He checks BGs 3-9 times daily, average 4.2 times per day. Average BG is 155, compared with 177 at his last visit and with 176 at his prior visit. BGs varied from  88-250, compared with 96-324 at his last visit and with 89-376 at his prior visit. His higher BGs occurred after lunch and dinner. His BGs tend to be lower from 6-8 PM.   6. CGM printout: Skin glucose values and BG values correlate very well. Average SG was 146, compared with 155 at his last visit and with 156 at his prior visit  He was in auto mode 95% of the time, compared with 99% of the time at hs last visit. He was in zone 85% of the time, compared with 79% of the time at hs last visit. His highest SGs occurred after dinner. He still sometimes consumes more carbs than he realizes, but overall has been consuming fewer carbs.  When his site is working well and his health is pretty good, SGs vary from the 80s to the low 200s.    PAST MEDICAL, FAMILY, AND SOCIAL HISTORY:  Past Medical History:  Diagnosis Date  . ADHD (attention deficit hyperactivity disorder)   . Autonomic neuropathy due to diabetes (HCC)   . Diabetes (HCC)   . Fatigue   . Goiter   . High blood pressure   . High cholesterol   . Hypercholesterolemia   . Hypertension   . Hypoglycemia associated with diabetes (HCC)   . Obesity   . Tachycardia   . Type 1 diabetes mellitus not at goal [facility])   . Uncontrolled DM with microalbuminuria or microproteinuria     Family History  Problem Relation Age of Onset  . Dementia Paternal Aunt   . Schizophrenia Paternal Aunt   . Cancer Maternal Grandmother   . Diabetes Maternal Grandmother        T2 DM  . Cancer Maternal Grandfather   . Heart disease Father   . Thyroid disease Neg Hx      Current Outpatient Medications:  .  acetaZOLAMIDE (DIAMOX) 500 MG capsule, TAKE 2 CAPSULES BY MOUTH DAILY IN THE MORNING AND 1 CAPSULE IN EVENING, Disp: 90 capsule, Rfl: 5 .  atorvastatin (LIPITOR) 20 MG tablet, , Disp: , Rfl: 0 .  glucagon 1 MG injection, Follow package directions for low blood sugar., Disp: 1 each, Rfl: 3 .  HUMALOG 100 UNIT/ML injection, INJECT 300 UNITS UNDER THE SKIN VIA  INSULIN PUMP EVERY 48 TO 72 HOURS PER HYPERGLYCEMIA AND DKA PROTOCOLS, Disp: 120 mL, Rfl: 3 .  losartan (COZAAR) 50 MG tablet, TAKE 1 TABLET(50 MG) BY MOUTH DAILY, Disp: 90 tablet, Rfl: 1 .  montelukast (SINGULAIR) 10 MG tablet, TAKE 1 TABLET(10 MG) BY MOUTH AT BEDTIME, Disp: 30 tablet, Rfl: 2 .  ONE TOUCH ULTRA TEST test strip, USE TO CHECK BLOOD SUGAR SIX TIMES DAILY, Disp: 600 each, Rfl: 1 .  topiramate (TOPAMAX) 50 MG tablet, Take 1 tablet (50 mg total) by mouth 2 (two) times daily., Disp: 60 tablet, Rfl: 5 .  acetaminophen (TYLENOL) 500 MG tablet, Take 1,000 mg by mouth 2 (two) times daily as needed., Disp: , Rfl:  .  albuterol (PROAIR HFA) 108 (90 Base) MCG/ACT inhaler, Inhale 2 puffs into the lungs every 6 (six) hours as needed for wheezing or shortness of breath. (Patient not taking: Reported on 04/26/2019), Disp: 1 Inhaler, Rfl: 2 .  albuterol (PROVENTIL) (2.5 MG/3ML) 0.083% nebulizer solution, Take 3 mLs (2.5 mg total) by nebulization every 4 (four) hours as needed for wheezing or shortness of breath. (Patient not taking: Reported on 04/26/2019), Disp: 75 mL, Rfl: 1 .  ALPRAZolam (XANAX) 0.25 MG tablet, Take 1 tablet (0.25 mg total) by mouth at bedtime as needed for anxiety. (Patient not taking: Reported on 04/26/2019), Disp: 30 tablet, Rfl: 0 .  ASMANEX HFA 200 MCG/ACT AERO, INHALE 2 PUFFS INTO THE LUNGS TWICE DAILY (Patient not taking: Reported on 04/26/2019), Disp: 13 g, Rfl: 5 .  AUVI-Q 0.3 MG/0.3ML SOAJ injection, Use as directed for life-threatening allergic reaction. (Patient not taking: Reported on 04/26/2019), Disp: 4 Device, Rfl: 2 .  budesonide (PULMICORT) 0.5 MG/2ML nebulizer solution, Take 2 mLs (0.5 mg total) by nebulization as directed. (Patient not taking: Reported on 04/26/2019), Disp: 2 mL, Rfl: 3 .  budesonide-formoterol (SYMBICORT) 160-4.5 MCG/ACT inhaler, Inhale 2 puffs into the lungs 2 (two) times daily. (Patient not taking: Reported on 04/26/2019), Disp:  1 Inhaler, Rfl: 5 .   clonazePAM (KLONOPIN) 0.5 MG tablet, Take 0.5 mg by mouth 2 (two) times daily as needed., Disp: , Rfl: 0 .  nystatin (MYCOSTATIN) 100000 UNIT/ML suspension, Swish and swallow 5 mls daily (Patient not taking: Reported on 04/26/2019), Disp: 150 mL, Rfl: 2 .  omeprazole (PRILOSEC) 20 MG capsule, Take 20 mg by mouth daily., Disp: , Rfl:   Current Facility-Administered Medications:  [name]  Mepolizumab SOLR 100 mg, 100 mg, Subcutaneous, Q28 days, Alfonse Spruce, MD, 100 mg at 02/16/19 1018  Facility-Administered Medications Ordered in Other Visits:  .  gadopentetate dimeglumine (MAGNEVIST) injection 20 mL, 20 mL, Intravenous, Once PRN, Dohmeier, Porfirio Mylar, MD  Allergies as of 04/26/2019 - Review Complete 04/26/2019  Allergen Reaction Noted  . Flonase [fluticasone] Shortness Of Breath 06/10/2017  . Molds & smuts Other (See Comments) 03/09/2018  . Sulfites Itching and Swelling 02/04/2018  . Tetracyclines & related Other (See Comments) 03/09/2018    1. Work and Family: He is not working part-time at home as much. 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 He hopes to return to full-time work soon. 2. Activities: He has not done much physical activity this calendar year. He was more physically active when he felt good back in November 2018.    3. Smoking, alcohol, or drugs: He has not been drinking any alcohol or beer. No tobacco or drugs. 4. Primary Care Provider: Dr. Guerry Bruin, MD, [facility] 5. Neurology: Dr. Vickey Huger 6. Ophthalmology:  7. Retinology: Dr. Luciana Axe 8. Urology: Dr. Annabell Howells at [facility] 9. Sports medicine: Dr. Darrick Penna 10. Therapy: Ms Baltazar Apo, RN, MSN  REVIEW OF SYSTEMS: There are no other significant problems involving [name]'s other body systems.   Objective:  Vital Signs:  BP 108/74   Pulse 90   Wt 230 lb 6.4 oz (104.5 kg)   BMI 31.25 kg/m    Ht Readings from Last 3 Encounters:  04/12/19 6' (1.829 m)  12/27/18 6'  (1.829 m)  12/15/18 6' (1.829 m)   Wt Readings from Last 3 Encounters:  04/26/19 230 lb 6.4 oz (104.5 kg)  04/12/19 229 lb (103.9 kg)  12/27/18 254 lb 3.2 oz (115.3 kg)   PHYSICAL EXAM:  Constitutional: The patient appears healthy and upbeat today. He does not look sick.  His weight has decreased 24 pounds since his last visit, mostly due to trying to reduce his carb intake. His affect is normal today. His insight is normal.  Face: The face appears normal.  Eyes: He does not have much allergic conjunctivitis. There is no obvious arcus or proptosis. Moisture appears normal.  Mouth: The oropharynx and tongue appear normal. Oral moisture is normal. Neck: The neck appears to be visibly normal. No carotid bruits are noted. He has a short neck. His strap muscles are smaller. The thyroid gland is low-lying and has shrunk back to top-normal size of about 20 grams. The consistency of the gland is normal. The thyroid gland is not tender to palpation today.  Lungs: The lungs are clear to auscultation. Air movement is good.  Heart: Heart rate and rhythm are regular. Heart sounds S1 and S2 are normal. I did not appreciate any pathologic cardiac murmurs. Abdomen: The abdomen is enlarged. Bowel sounds are normal. There is no obvious hepatomegaly, splenomegaly, or other mass effect. The abdomen is somewhat tender to deep palpation laterally in both sides. Arms: Muscle size and bulk are normal for age. Hands: There is  no tremor of his hands today. Phalangeal and metacarpophalangeal joints are normal. Palmar muscles are normal. Palmar skin is normal. Palmar moisture is also normal. Legs: Muscles appear normal for age. No edema is present. Feet: 1+ right DP pulse, 2+ left DP pulse. Neurologic: Strength is normal for age in both the upper and lower extremities. Muscle tone is normal. Sensation to touch is normal in both legs and both feet.   LAB DATA:   Labs 04/26/19: HbA1c 6.8%, CBG 230  Labs 12/30/18: TSH  2.07, free T4 1.1, free T3 3.2, TPO antibody <1, thyroglobulin antibody <1; CMP normal, except glucose 191; cholesterol 165, triglycerides 138, HDL 33, LDL 1-6; urinary microalbumin/creatinine ratio 5; vitamin B1 10 (ref 8-30), vitamin B6 3.4 (ref 2.1-21.7), vitamin B12 451 (ref [phone])  Labs 11/29/18: CBG 179  Labs 11/29/18: CMP with glucose 198, chloride 109 (ref 96-106, CO2 17 (ref 20-29); CBC with WBC 11.6 (ref 3.4-10.8), PMNs 8.6 (ref 1.4-7.0), and monocytes 1.0 (ref 0.1-0.9)  Labs 10/28/18: HbA1c 6.9%, CBG 155  Labs 10/06/18: ANA titer negative, ANCA titers negative, rheumatoid factor negative, cyclic citrullin peptide 7 (ref 0-190  Labs 07/27/18: CBG 193  Labs 05/31/18: HbA1c 6.6%, CBG 202  Labs 03/18/18: CBG 178  Labs 01/13/18: HbA1c 6.8%, CBG 194; TSH 0.98, free T4 1.3; free T3 3.6; CMP normal except glucose 191; urinary microalbumin/creatinine ratio 35 (ref <30)  Labs 11/17/17: CBG 170  Labs 10/13/17: HbA1c 6.8%, CBG 191  Labs 08/24/17: CBG 117  Labs 07/20/17: CBG 216  Labs 07/08/17: IgA, IgG, and IgM normal. CBC normal.  Labs 06/16/17: HbA1c 6.5%, CBG 193  Labs 05/15/17: CBG 209; TSH 0.90, free T4 1.2, free T3 3.8  Labs 03/05/17: HbA1c 7.3%, CBG 241  Labs 02/20/17: CBG 180 fasting  Labs 01/29/17: CBG 148  Labs 01/12/17: CBG 116  Labs 12/29/16: HbA1c 9.1%, CBG 223; TSH 0.62, free T4 1.1, free T3 3.5; urine microalbumin/creatinine ratio 22; cholesterol 171, triglycerides 93, HDL 40, LDL 112;  CMP glucose 201  Labs 09/26/16: HbA1c 9.5%  Labs 08/13/16: Celiac panel negative; CMP normal except for glucose 207; CBC normal  Labs 06/13/16: HbA1c 9.8%; LH 3.6, FSH 2.3, testosterone 533 (ref [id number]), free testosterone 59.4 (ref 47-244)  Labs 02/11/16: HbA1c 9.2%  Labs 01/07/16: HbA1c 10.5%; CBC with Hgb elevated at 17.2%; LH 3.9, FSH 2.0; TSH 1.53, free T4 1.3, free T3 3.7; cholesterol 262, triglycerides 171, HDL 40, LDL 188; urinary microalbumin/creatinine ratio 31; CMP normal  except for glucose 181  Labs 06/29/15. HbA1c 9.6%  Labs 12/28/14: Hemoglobin A1c 9.5% today, compared with 9.5%, at last visit and with 9.9% at the prior visit.  Labs 08/03/14: HbA1c 9.5%; TSH 0.548,free T4 1.10, free T3 3.7, TPO antibody < 1, TSI 30; CMP normal except glucose 191; urinary microalbumin/creatinine ratio 27; cholesterol 260, triglycerides 90, HDL 51, LDL 191   Labs 10/05/13: HbA1c 9.9%  Labs 04/23/13: CMP normal, except glucose 144; cholesterol 216, triglycerides 97, HDL 39, LDL 158; urinary microalbumin/creatinine ratio 6.9; TSH 0.505, free T4 1.17, free T3 3.8  Labs: 01/21/12: TSH 1.251, free T4 1.10, free T3 3.6, CBC normal, CMP normal except for glucose 206, urinary microalbumin/creatinine ratio 6.3, cholesterol 187, triglycerides 67, HDL 45, LDL 129   Assessment and Plan:   ASSESSMENT:  1. T1DM:   A. His BGs and HbA1c are a bit lower and are doing quite well overall, considering the increased illness that he has been having for the past months.   B. His BGs still vary  with whether or not he has active allergic rhinitis/sinusitis/bronchitis or other illnesses, whether or not he takes prednisone or other glucocorticoids, whether or not he feels well enough to exercise, and how long he keeps his sites in place. Almost all of his higher BGs in the past month were due to higher carb intake than he sometimes recognized.   C. The combination of the Medtronic 670G pump and Guardian 3 sensor is again helping him achieve good BG control.   D. Dr. Vickey Huger was worried that Diamox might cause erroneous BG or SG values. Thus far his SGs and BGs have correlated well.   E. [name] has done very well at trying to optimize his BG control, to include consuming fewer carbs. Although he occasionally does not bolus enough, or boluses late, or waits too long to change sites, or eats later than he should, [name] is really doing well.  2. Hypoglycemia: He has had no BGs <80.  3. Hypertension: BP is good  today. He needs to continue to take meds regularly and to exercise regularly when he feels well enough to do so.  4. Combined hyperlipidemia: His lipids in March 2017 were too high, but the lipids in March 2018 were much better on atorvastatin. He discontinued the atorvastatin in mid-2018 due to concern that this medication might be causing his neuromuscular symptoms. We will plan to re-start the atorvastatin in the near future once he becomes stable on immunotherapy. We will repeat his lipid panel today. 5. Autonomic neuropathy with tachycardia and gastroparesis: His gastroparesis has improved somewhat. His heart rate has decreased recently as well. .  6. Obesity: Weight has decreased, in part due to consuming fewer carbs and less sodium.  7-8. Goiter/thyroiditis:   A. His thyroid gland has shrunk back to normal size. His thyroiditis is clinically quiescent. The active thyroiditis that he has had in the past and the  process of waxing and waning of thyroid gland size are c/w evolving Hashimoto's thyroiditis.   B. He was euthyroid in March 2013, but was borderline hyperthyroid in June 2014. He was euthyroid in October 2015, March 2017, March 2018, July 2018, in March 2019, and in March 2020.  9. Fatigue: This problem had worsened during his recent illnesses, but is better today..   10. Obstructive sleep apnea: He did not meet the criteria for OSA, but because of his snoring a dental appliance was suggested. I recommended that he obtain a dental appliance. Unfortunately, his former dentist no longer accepts [employer], so [name] has to find a new dentist. 11. Adult ADD: He had an appointment with Dr. Piedad Climes, MD in [place] in November 2018. Dr. Shane Crutch is waiting for [name]'s medical situation to stabilize before beginning any new psych meds.   12. Proliferative diabetic retinopathy: This problem is due to his long period of poorly controlled BGs. The retinopathy was worse earlier in  July 2019. It will take at least 6-12 months of near-normal BGs for his retinopathy to stabilize.  He will have follow up exam soon.  13. URI/nasal congestion/vertigo/allergic rhinitis and conjunctivitis: He is doing much better today. His family still wants him to go to the [facility] for a comprehensive evaluation. At this point he is doing better, so he is not sure if he wants to pursue this option.   14. Neuromuscular symptoms and headaches/increased intracranial pressure:   A. It appeared previously that some of [name]'s headaches were classic tension headaches. When he performed cervical stretching  exercises the headache resolved.   B. In the past when Dr. Vickey Huger evaluated [name] for his vague neuromuscular symptoms, she did not find any signs of any serious neurologic disease. His MRI of his brain was unremarkable. At that time his neuromuscular symptoms had resolved.  C. Later, however, the headaches worsened and some of the neuromuscular symptoms recurred. We now know that he had increased ICP and that his headaches improved for a brief time after the LP. He then developed a typical spinal tap headache.  D. In retrospect he may have had intermittent problems with elevated ICP previously.  E. Dr. Vickey Huger suspects that [name]'s ICP was due to the doxycycline. She will follow this issue over time.   F. He appeared to have problems taking his prior dose of Diamox, but is doing better on the combination of a lower dose of Diamox and Topamax.  15. Calcified lymph node: It is possible that he may have contracted a fungus while he was working in the [employer] several years ago. His thyroid US showed his goiter, but no intrinsic thyroid abnormality. A partially calcified cervical lymph node on the right was noted, c/w a post infections/inflammatory node. 16. Microalbuminuria: His ratio was mildly elevated in March 2019. He needed tighter BG and BP control. His ratio in March 2020 was very good.  17.  Anhedonia: [name] is not depressed today. He feels better about feeling better. His sessions with Ms Baltazar Apo, RN, MSN, a psychiatric nurse specialist, have been very beneficial.   PLAN:  1. Diagnostic:  Repeat HbA1c and CBG  today. I reviewed his annual fasting surveillance  results from March..  2. Therapeutic: Follow DM care plan. Continue losartan. Continue his current pump basal rates, ISFs, ICRs, and BG targets.   Basal rates: basal 1 MN:1.20 6 AM: 1.90 2 PM: 1.60 8 PM: 1.65   ISFs: 35   ICRs: MN: 7 8 AM: 4.5 11 AM: 4.5 9 PM: 7   BG targets:  MN: 130-150 6 AM: 120 10 PM 130-150  3. Patient education:   A. We discussed the illnesses that he has had for the past two years.  We also discussed the need for him to continue to be followed closely by Drs Wylene Simmer, Kozlow, Dohmeier, Rankin, Denita Lung, Ms Lynwood, and Dr. Shane Crutch when he can again do so. .   B.  We discussed the fact that he has a relative who is an ENT physician in [place]. That relative advised him to go to the [facility] in [place], [place] to obtain a multidisciplinary evaluation of all of his problems to see if there might be one unifying cause that we have missed. I told him that it is possible that all of [place] could have missed a unifying cause, but that does not seem to be the case, especially since he is doing much better now than he was. However, going to [facility] is certainly something that might prove valuable to him, especially if he has a relapse. If he decides to move ahead with the referral to [facility], he will check with his medical insurance company to see if his insurance will cover such an evaluation.  4. Follow-up:  2 months  Level of Service: This visit lasted in excess of 60 minutes. More than 50% of the visit was devoted to counseling.  Molli Knock, MD, CDE Adult and Pediatric Endocrinology  04/26/2019 9:07 AM

## 2019-04-26 NOTE — Patient Instructions (Signed)
Follow up visit in 2 months.  

## 2019-05-04 ENCOUNTER — Encounter (INDEPENDENT_AMBULATORY_CARE_PROVIDER_SITE_OTHER): Payer: Self-pay

## 2019-05-04 ENCOUNTER — Telehealth (INDEPENDENT_AMBULATORY_CARE_PROVIDER_SITE_OTHER): Payer: Self-pay | Admitting: "Endocrinology

## 2019-05-04 MED ORDER — ALPRAZOLAM 0.25 MG PO TABS: ORAL_TABLET | ORAL | 3 refills | Status: AC

## 2019-05-04 MED ORDER — ALPRAZOLAM 0.25 MG PO TABS: ORAL_TABLET | ORAL | 3 refills | Status: DC

## 2019-05-04 NOTE — Addendum Note (Signed)
Addended by: Sherrlyn Hock on: 05/04/2019 04:03 PM   Modules accepted: Orders

## 2019-05-04 NOTE — Telephone Encounter (Signed)
1. [name]'s therapist, ms. Rea College, MSN, sent me a text. [name] is out of his 0.25 mg xanax tablets. The physician who formerly prescribed he xanax no longer does so.  2. I told her that I will be glad to prescribe the xanax for [name].

## 2019-05-05 ENCOUNTER — Telehealth: Payer: 59 | Admitting: Physician Assistant

## 2019-05-05 DIAGNOSIS — Z20822 Contact with and (suspected) exposure to covid-19: Secondary | ICD-10-CM

## 2019-05-05 NOTE — Progress Notes (Signed)
E-Visit for Corona Virus Screening   Your current symptoms could be consistent with the coronavirus.  Call your health care provider or local health department to request and arrange formal testing. Many health care providers can now test patients at their office but not all are.  Please quarantine yourself while awaiting your test results.  [facility] [phone], [facility] [phone], [facility] [phone] or visit [email or web address]  and You have been enrolled in MyChart Home Monitoring for COVID-19.  Daily you will receive a questionnaire within the MyChart website. Our COVID-19 response team will be monitoring your responses daily.    COVID-19 is a respiratory illness with symptoms that are similar to the flu. Symptoms are typically mild to moderate, but there have been cases of severe illness and death due to the virus. The following symptoms may appear 2-14 days after exposure: . Fever . Cough . Shortness of breath or difficulty breathing . Chills . Repeated shaking with chills . Muscle pain . Headache . Sore throat . New loss of taste or smell . Fatigue . Congestion or runny nose . Nausea or vomiting . Diarrhea  It is vitally important that if you feel that you have an infection such as this virus or any other virus that you stay home and away from places where you may spread it to others.  You should self-quarantine for 14 days if you have symptoms that could potentially be coronavirus or have been in close contact a with a person diagnosed with COVID-19 within the last 2 weeks. You should avoid contact with people age 65 and older.   You should wear a mask or cloth face covering over your nose and mouth if you must be around other people or animals, including pets (even at home). Try to stay at least 6 feet away from other people. This will protect the  people around you.   You may also take acetaminophen (Tylenol) as needed for fever.   Reduce your risk of any infection by using the same precautions used for avoiding the common cold or flu:  . Wash your hands often with soap and warm water for at least 20 seconds.  If soap and water are not readily available, use an alcohol-based hand sanitizer with at least 60% alcohol.  . If coughing or sneezing, cover your mouth and nose by coughing or sneezing into the elbow areas of your shirt or coat, into a tissue or into your sleeve (not your hands). . Avoid shaking hands with others and consider head nods or verbal greetings only. . Avoid touching your eyes, nose, or mouth with unwashed hands.  . Avoid close contact with people who are sick. . Avoid places or events with large numbers of people in one location, like concerts or sporting events. . Carefully consider travel plans you have or are making. . If you are planning any travel outside or inside the [place], visit the CDC's Travelers' Health webpage for the latest health notices. . If you have some symptoms but not all symptoms, continue to monitor at home and seek medical attention if your symptoms worsen. . If you are having a medical emergency, call 911.  HOME CARE . Only take medications as instructed by your medical team. . Drink plenty of fluids and get plenty of rest. . A steam or ultrasonic humidifier can help if you have congestion.   GET HELP RIGHT AWAY IF YOU HAVE EMERGENCY WARNING SIGNS** FOR COVID-19. If   you or someone is showing any of these signs seek emergency medical care immediately. Call 911 or proceed to your closest emergency facility if: . You develop worsening high fever. . Trouble breathing . Bluish lips or face . Persistent pain or pressure in the chest . New confusion . Inability to wake or stay awake . You cough up blood. . Your symptoms become more severe  **This list is not all possible symptoms. Contact your  medical provider for any symptoms that are sever or concerning to you.   MAKE SURE YOU   Understand these instructions.  Will watch your condition.  Will get help right away if you are not doing well or get worse.  Your e-visit answers were reviewed by a board certified advanced clinical practitioner to complete your personal care plan.  Depending on the condition, your plan could have included both over the counter or prescription medications.  If there is a problem please reply once you have received a response from your provider.  Your safety is important to us.  If you have drug allergies check your prescription carefully.    You can use MyChart to ask questions about today's visit, request a non-urgent call back, or ask for a work or school excuse for 24 hours related to this e-Visit. If it has been greater than 24 hours you will need to follow up with your provider, or enter a new e-Visit to address those concerns. You will get an e-mail in the next two days asking about your experience.  I hope that your e-visit has been valuable and will speed your recovery. Thank you for using e-visits.    

## 2019-05-05 NOTE — Progress Notes (Signed)
I have spent 5 minutes in review of e-visit questionnaire, review and updating patient chart, medical decision making and response to patient.   Alf Doyle Cody Keily Lepp, PA-C    

## 2019-05-06 ENCOUNTER — Encounter (INDEPENDENT_AMBULATORY_CARE_PROVIDER_SITE_OTHER): Payer: Self-pay

## 2019-05-07 ENCOUNTER — Encounter (INDEPENDENT_AMBULATORY_CARE_PROVIDER_SITE_OTHER): Payer: Self-pay

## 2019-05-08 ENCOUNTER — Encounter (INDEPENDENT_AMBULATORY_CARE_PROVIDER_SITE_OTHER): Payer: Self-pay

## 2019-05-11 ENCOUNTER — Encounter (INDEPENDENT_AMBULATORY_CARE_PROVIDER_SITE_OTHER): Payer: Self-pay

## 2019-05-12 ENCOUNTER — Encounter (INDEPENDENT_AMBULATORY_CARE_PROVIDER_SITE_OTHER): Payer: Self-pay

## 2019-05-13 ENCOUNTER — Encounter (INDEPENDENT_AMBULATORY_CARE_PROVIDER_SITE_OTHER): Payer: Self-pay

## 2019-05-14 ENCOUNTER — Encounter (INDEPENDENT_AMBULATORY_CARE_PROVIDER_SITE_OTHER): Payer: Self-pay

## 2019-05-18 ENCOUNTER — Encounter (INDEPENDENT_AMBULATORY_CARE_PROVIDER_SITE_OTHER): Payer: Self-pay

## 2019-06-20 ENCOUNTER — Other Ambulatory Visit: Payer: Self-pay | Admitting: Neurology

## 2019-06-20 MED ORDER — TOPIRAMATE 50 MG PO TABS: 50.0000 mg | ORAL_TABLET | Freq: Two times a day (BID) | ORAL | 5 refills | Status: DC

## 2019-06-24 ENCOUNTER — Other Ambulatory Visit: Payer: Self-pay | Admitting: Allergy and Immunology

## 2019-06-24 DIAGNOSIS — J3089 Other allergic rhinitis: Secondary | ICD-10-CM

## 2019-06-24 DIAGNOSIS — Z91018 Allergy to other foods: Secondary | ICD-10-CM

## 2019-06-24 DIAGNOSIS — H6983 Other specified disorders of Eustachian tube, bilateral: Secondary | ICD-10-CM

## 2019-07-12 ENCOUNTER — Encounter (INDEPENDENT_AMBULATORY_CARE_PROVIDER_SITE_OTHER): Payer: Self-pay | Admitting: "Endocrinology

## 2019-07-12 ENCOUNTER — Other Ambulatory Visit: Payer: Self-pay

## 2019-07-12 ENCOUNTER — Ambulatory Visit (INDEPENDENT_AMBULATORY_CARE_PROVIDER_SITE_OTHER): Payer: 59 | Admitting: "Endocrinology

## 2019-07-12 VITALS — BP 100/70 | HR 100 | Ht 70.91 in | Wt 226.8 lb

## 2019-07-12 DIAGNOSIS — Z23 Encounter for immunization: Secondary | ICD-10-CM

## 2019-07-12 DIAGNOSIS — IMO0001 Reserved for inherently not codable concepts without codable children: Secondary | ICD-10-CM

## 2019-07-12 DIAGNOSIS — E1065 Type 1 diabetes mellitus with hyperglycemia: Secondary | ICD-10-CM | POA: Diagnosis not present

## 2019-07-12 LAB — POCT GLYCOSYLATED HEMOGLOBIN (HGB A1C): Hemoglobin A1C: 6.4 % — AB (ref 4.0–5.6)

## 2019-07-12 LAB — POCT GLUCOSE (DEVICE FOR HOME USE): POC Glucose: 231 mg/dl — AB (ref 70–99)

## 2019-07-12 NOTE — Patient Instructions (Signed)
Follow up visit in 2 months.  

## 2019-07-12 NOTE — Progress Notes (Signed)
Subjective:  Patient Name: [name] Date of Birth: 05/23/1981  MRN: [id number]  Joseph ProntoGabriel [name]  presents to the office today for follow-up of his type 1 diabetes mellitus, goiter, obesity, combined hyperlipidemia, adult ADHD, fatigue, autonomic neuropathy, tachycardia, hypertension, microalbuminuria, hypoglycemia, sinusitis, bronchitis, allergies, proliferative retinopathy, and pseudotumor cerebri/increased intracranial hypertension.   HISTORY OF PRESENT ILLNESS:   [name] is a 38 y.o. Caucasian gentleman. [name] was unaccompanied.  1. The patient was first referred to me on 01/08/2006 by his family physician, Dr. Roanna EpleyBert Fields, for evaluation and management of type 1 diabetes mellitus and related problems. The patient was 38 years old.  A. [name] had been diagnosed with type 1 diabetes somewhere in 2001-2002, at the age of 37-19. He was initially diagnosed with type 2 diabetes mellitus, but was later re-classified as type 1 diabetes mellitus. He had been followed by Dr. Arther Damesom O'Connell, staff [facility]ocrin[facility]. [name] was started on an insulin pump approximately 18 months prior to first seeing me. His pump was a Medtronic Paradigm 712. His blood glucose control was fair-to-poor. He was frequently not checking blood sugars or taking insulin boluses as often as he needed to. He frequently noted fast heart rate. The patient's past medical history was positive for hypertension, for which he was taking lisinopril, and for what his mother called "extreme ADHD". He had previously been taking ADHD medicines but had discontinued them due to adverse effects. He had prior ankle injuries and knee injuries, to include tears of the lateral menisci bilaterally. He had not had any surgeries. He was working for a [employer] company that had many different job sites in different parts of the [place] and in other countries as well. 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 He did not use tobacco or drugs, but did drink alcohol occasionally.  B. On physical examination, his weight was 234 pounds, his height was 70 inches, his BMI was 33.2. Blood pressure was 128/70. Hemoglobin A1c was 9.5%. Heart rate was 96. He had normal affect and fair insight. He had a 25-30 gram goiter. He had normal 1+ DP pulses in his feet. He had normal sensation in his feet to touch, vibration, and monofilament. His CMP was normal except for glucose of 251. His cholesterol was 257, triglycerides 143, HDL 49, and LDL 179. TSH was 1.017, free T4 was 1.06, and free T3 was 3.3. His urinary microalbumin: creatinine ratio was 20.1 (normal less than 30).   C. The patient clearly needed better blood glucose control, which would only occur if he had better adherence to his plan. He appeared to have autonomic neuropathy, manifested by inappropriate sinus tachycardia.  His thyroid goiter suggested that he might have evolving Hashimoto's disease. He stated that he had lost some weight recently. I encouraged him to check his blood sugars more frequently and to take both correction boluses and food boluses.  2. During the past thirteen years, [name] has had to deal with many different problems:  A. T1DM/hypoglycemia:    1). 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 His hemoglobin A1c values had varied from 8.3-11.2%.   2). After starting him on a Medtronic 760G insulin pump and Guardian 3 sensor on 01/12/17, his BG control has improved significantly, despite the illnesses described below.   B.  DM complications: .   1). He has bilateral diabetic retinopathy and is followed by his retinologist, Dr. Verdell Carmine, MD.   2). He has autonomic neuropathy manifested by inappropriate sinus tachycardia and gastroparesis causing postprandial bloating and GERD. These  problems have varied inversely with his degree of BG control.   C. Combined hyperlipidemia:     1). On 12/03/10 his total cholesterol was 270, triglycerides 94, HDL 50, and LDL 201. I started him on Crestor, 10 mg per day. He has since begun treatment with atorvastatin and his cholesterol values had improved.    2). On 12/29/16 his lipids had improved, but his cholesterol values were still elevated. After developing the illnesses noted below, his atorvastatin was discontinued due to concerns that he might be having adverse reactions to the atorvastatin.    D. Allergies, sinusitis, bronchitis, headaches, muscle pains/spasms:   1). Unfortunately, beginning in about June 2018, [name] began to develop recurrent episodes of allergic rhinitis, head fullness, sinusitis, and bronchitis. He also developed headaches, muscle pains and spasms, lethargy, fatigue, and weakness that were presumably due in large part to severe allergies. His atorvastatin and several other medications were stopped at that time. Despite receiving excellent care from his allergist, to include allergy injections and biologic injections, [name]'s allergic issues persisted.    2). In the past 4-6 months these illnesses/problems have largely resolve, in part due to taking a monthly biologic allergy injection.   E. Increased intracranial pressure:    1). Joseph Beach was seen in consultation by Dr. Melvyn Novas, neurology, on 05/28/17 for headaches, tremors of his right hand and foot, shaky vision, and symptoms c/w OSA. His initial sleep study did not show any significant amount of OSA.    2). On 03/08/18, Dr.Dohmeier was notified that [name]'s eye doctor had seen signs of fluid build up in his right eye. An LP was performed in radiology on 03/09/18. Opening pressure was 28 cm of water, c/w increased intracranial pressure and pseudotumor cerebri. This problem was initially though to be due to tetracycline usage, so TCN was discontinued and Diamox begun. The Diamox  doses have been changed over time. Many of his neuromuscular symptoms have improved since then.   G. Adult ADD/depression: Joseph Beach was evaluated by Dr Piedad Climes, a noted neuropsychiatrist in [place] in 2018. At his last follow up visit in November 2018, Dr. Shane Crutch decided to wait until [name]'s overall health situation  stabilized before trying any new medications.   H. General physical and emotional health: Joseph Beach has been very sick for much of the past 2 years. He has been unable to work full-time and has become almost confined to his home. He has become increasingly anhedonic over time.    3. [name]'s last [facility] visit was on 04/26/2019. At that visit we continued his insulin pump settings.    A. In the interim he has been fairly healthy and is feeling "fairly decent" overall. His allergies have not been acting up recently.  B. He has talked with his therapist, Ms. [facility], every week. She recommended starting alprazolam, 0.25 mg at bedtime when needed for anxiety. Dr. Vickey Huger prescribed the medication for him.  He thinks the medication and the therapy are helping him.   C. On 01/03/19 he had an LP that showed elevated pressure. His diamox dose was increased somewhat. Dr. Vickey Huger also started him on Topamax. He had a follow up visit with Dr. Vickey Huger on 02/16/19. She continued him on low doses of both medications.  He still has some adverse effects of the Diamox, but not as bad  D. He was treated with nitrofurantoin for acute cystitis on 01/28/19.  E. He saw Dr. Luciana Axeankin in May 2020. There were no new signs of diabetic eye disease.   F. He saw Dr. Sharyn LullKoslow on 03/15/19. Ginette Pitmanhrush was noted and treated.   G. His nausea still occurs about every two weeks, but is much better on omeprazole.   H. He has not had any allergy injections for 7-8 months, but has continued to take his monthly biologic injection.    I. He continues to have problems with lactose intolerance. When he avoids lactose, however, he is  asymptomatic. Some foods such as shellfish cause his throat to swell up.   J. He uses Humalog aspart insulin in his insulin pump. He takes losartan, 50 mg/day. He rarely has any coughing unless his allergies act up. .    K. His second sleep study was not bad enough to start on C-pap.    L. His BGs have varied with his illnesses, with steroid usage, and with the amount of postprandial bloating and constipation that he has. Fortunately, the bloating and constipation have improved in parallel with improvements in his BG control. He no longer has to delay his boluses very often, because he is not as worried that his gastric emptying will be too delayed and cause hypoglycemia.     4. Pertinent Review of Systems: Constitutional: [name] feels "pretty good, except for some aches and pains". "I have more energy. This is the longest I've gone without being sick." He is still tired, "about the same". He is still often sore and achy, maybe more so since resuming physical exercise. Headaches are "about the same". "They come and go." The visual color changes, muscle spasms, and tingling that were attributed to Diamox now bother him only "occasionally" since reducing the Diamox dose.  Eyes: As above. His vision is usually good with his glasses. His eyes tire easily. He had an eye exam on 06/06/19. There were no new signs of DM damage.  Neck: He has not had any intermittent anterior neck swelling and tenderness for months.  Lungs: Breathing has been "pretty good", but he still sometimes has SOB with exertion.  Heart: No problems Gastrointestinal: As above. He does not have much postprandial bloating or reflux. The patient has no other complaints of excessive hunger, heartburn, upset stomach, stomach aches or pains, or diarrhea while taking Prilosec, 10 mg, daily.  Legs: As above. He occasionally still has the sensation of vibration in his legs, but much less often, perhaps only about once every 3 weeks or so. These  sensations may last for up to half a day. Muscle mass and strength seem normal. There are no other complaints of numbness, tingling, burning, or pain. No edema is noted. Feet: There are no obvious foot problems now. He still has episodic foot pains. There are no complaints of numbness, or burning.  No edema is noted. Hypoglycemia: He rarely has low BGs.  Emotional: "I'm managing it. I could always be doing better. I'm still worried about being exposed to covid."   5. BG printout: We have data from the past 4 weeks. [name] changes sites every 1-3 days. He checks BGs 3-6 times daily, average 3.7 times per day. Average BG is 163, compared with 155 at his last visit and with 177 at his prior visit. BGs varied from 80-350, compared with 88-250, at his last visit and with  96-324 at his prior visit. His higher BGs occurred when he was in manual mode or soon after some site changes. His BGs tend to be lower from 8-9 PM.   6. CGM printout: Skin glucose values and BG values correlate very well. Average SG was 151, compared with 146 at his last visit and with 155 at his prior visit  He was in auto mode 99% of the time, compared with 95% of the time at hs last visit. He was in zone 81% of the time, compared with 85% of the time at his last visit. His highest SGs occurred after lunch and dinner. He still sometimes consumes more carbs than he realizes and has been consuming about 11% more carbs overall . When his site is working well and his health is pretty good, SGs vary from the 90s to the 170s.     PAST MEDICAL, FAMILY, AND SOCIAL HISTORY:  Past Medical History:  Diagnosis Date  . ADHD (attention deficit hyperactivity disorder)   . Autonomic neuropathy due to diabetes (HCC)   . Diabetes (HCC)   . Fatigue   . Goiter   . High blood pressure   . High cholesterol   . Hypercholesterolemia   . Hypertension   . Hypoglycemia associated with diabetes (HCC)   . Obesity   . Tachycardia   . Type 1 diabetes mellitus  not at goal [facility])   . Uncontrolled DM with microalbuminuria or microproteinuria     Family History  Problem Relation Age of Onset  . Dementia Paternal Aunt   . Schizophrenia Paternal Aunt   . Cancer Maternal Grandmother   . Diabetes Maternal Grandmother        T2 DM  . Cancer Maternal Grandfather   . Heart disease Father   . Thyroid disease Neg Hx      Current Outpatient Medications:  .  acetaZOLAMIDE (DIAMOX) 500 MG capsule, TAKE 2 CAPSULES BY MOUTH DAILY IN THE MORNING AND 1 CAPSULE IN EVENING, Disp: 90 capsule, Rfl: 5 .  ALPRAZolam (XANAX) 0.25 MG tablet, Take one tablet at bedtime as needed., Disp: 30 tablet, Rfl: 3 .  atorvastatin (LIPITOR) 20 MG tablet, , Disp: , Rfl: 0 .  glucagon 1 MG injection, Follow package directions for low blood sugar., Disp: 1 each, Rfl: 3 .  HUMALOG 100 UNIT/ML injection, INJECT 300 UNITS UNDER THE SKIN VIA INSULIN PUMP EVERY 48 TO 72 HOURS PER HYPERGLYCEMIA AND DKA PROTOCOLS, Disp: 120 mL, Rfl: 3 .  losartan (COZAAR) 50 MG tablet, TAKE 1 TABLET(50 MG) BY MOUTH DAILY, Disp: 90 tablet, Rfl: 1 .  montelukast (SINGULAIR) 10 MG tablet, TAKE 1 TABLET(10 MG) BY MOUTH AT BEDTIME, Disp: 30 tablet, Rfl: 3 .  nystatin (MYCOSTATIN) 100000 UNIT/ML suspension, Swish and swallow 5 mls daily, Disp: 150 mL, Rfl: 2 .  omeprazole (PRILOSEC) 20 MG capsule, Take 20 mg by mouth daily., Disp: , Rfl:  .  ONE TOUCH ULTRA TEST test strip, USE TO CHECK BLOOD SUGAR SIX TIMES DAILY, Disp: 600 each, Rfl: 1 .  topiramate (TOPAMAX) 50 MG tablet, Take 1 tablet (50 mg total) by mouth 2 (two) times daily., Disp: 60 tablet, Rfl: 5 .  acetaminophen (TYLENOL) 500 MG tablet, Take 1,000 mg by mouth 2 (two) times daily as needed., Disp: , Rfl:  .  albuterol (PROAIR HFA) 108 (90 Base) MCG/ACT inhaler, Inhale 2 puffs into the lungs every 6 (six) hours as needed for wheezing or shortness of breath. (Patient not taking: Reported on 04/26/2019),  Disp: 1 Inhaler, Rfl: 2 .  albuterol (PROVENTIL) (2.5  MG/3ML) 0.083% nebulizer solution, Take 3 mLs (2.5 mg total) by nebulization every 4 (four) hours as needed for wheezing or shortness of breath. (Patient not taking: Reported on 04/26/2019), Disp: 75 mL, Rfl: 1 .  ASMANEX HFA 200 MCG/ACT AERO, INHALE 2 PUFFS INTO THE LUNGS TWICE DAILY (Patient not taking: Reported on 04/26/2019), Disp: 13 g, Rfl: 5 .  AUVI-Q 0.3 MG/0.3ML SOAJ injection, Use as directed for life-threatening allergic reaction. (Patient not taking: Reported on 04/26/2019), Disp: 4 Device, Rfl: 2 .  budesonide (PULMICORT) 0.5 MG/2ML nebulizer solution, Take 2 mLs (0.5 mg total) by nebulization as directed. (Patient not taking: Reported on 04/26/2019), Disp: 2 mL, Rfl: 3 .  budesonide-formoterol (SYMBICORT) 160-4.5 MCG/ACT inhaler, Inhale 2 puffs into the lungs 2 (two) times daily. (Patient not taking: Reported on 04/26/2019), Disp: 1 Inhaler, Rfl: 5  Current Facility-Administered Medications:  [name]  Mepolizumab SOLR 100 mg, 100 mg, Subcutaneous, Q28 days, Alfonse Spruce, MD, 100 mg at 02/16/19 1018  Facility-Administered Medications Ordered in Other Visits:  .  gadopentetate dimeglumine (MAGNEVIST) injection 20 mL, 20 mL, Intravenous, Once PRN, Dohmeier, Porfirio Mylar, MD  Allergies as of 07/12/2019 - Review Complete 07/12/2019  Allergen Reaction Noted  . Flonase [fluticasone] Shortness Of Breath 06/10/2017  . Molds & smuts Other (See Comments) 03/09/2018  . Sulfites Itching and Swelling 02/04/2018  . Tetracyclines & related Other (See Comments) 03/09/2018    1. Work and Family: He is working part-time at home more than at his last visit. 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 He hopes to return to full-time work soon. 2. Activities: He started walking again, about 4 times per week for about 30 minutes each time. 3. Smoking, alcohol, or drugs: He has not been drinking any alcohol or beer. No tobacco or drugs. 4. Primary Care Provider: Dr. Guerry Bruin, MD,  [facility] 5. Neurology: Dr. Vickey Huger 6. Ophthalmology:  7. Retinology: Dr. Luciana Axe 8. Urology: Dr. Annabell Howells at [facility] 9. Sports medicine: Dr. Darrick Penna 10. Therapy: Ms Baltazar Apo, RN, MSN  REVIEW OF SYSTEMS: There are no other significant problems involving Terique's other body systems.   Objective:  Vital Signs:  BP 100/70   Pulse 100   Ht 5' 10.91" (1.801 m)   Wt 226 lb 12.8 oz (102.9 kg)   BMI 31.72 kg/m    Ht Readings from Last 3 Encounters:  07/12/19 5' 10.91" (1.801 m)  04/12/19 6' (1.829 m)  12/27/18 6' (1.829 m)   Wt Readings from Last 3 Encounters:  07/12/19 226 lb 12.8 oz (102.9 kg)  04/26/19 230 lb 6.4 oz (104.5 kg)  04/12/19 229 lb (103.9 kg)   PHYSICAL EXAM:  Constitutional: The patient appears healthy and upbeat today. His weight has decreased 3-1/4 pounds since his last visit, mostly due to increasing his activity level. His affect is normal today. His insight is normal. He engaged very well today.  Face: The face appears normal.  Eyes: He does not have much allergic conjunctivitis. There is no obvious arcus or proptosis. Moisture appears normal.  Mouth: The oropharynx and tongue appear normal. Oral moisture is normal. Neck: The neck appears to be visibly normal. No carotid bruits are noted. He has a short neck. His strap muscles are smaller. The thyroid gland is low-lying and has shrunk back to top-normal size of about 20 grams. The consistency of the gland is normal. The thyroid gland is  not tender to palpation today.  Lungs: The lungs are clear to auscultation. Air movement is good.  Heart: Heart rate and rhythm are regular. Heart sounds S1 and S2 are normal. I did not appreciate any pathologic cardiac murmurs. Abdomen: The abdomen is enlarged. Bowel sounds are normal. There is no obvious hepatomegaly, splenomegaly, or other mass effect. The abdomen is somewhat tender to deep palpation laterally in both sides. Arms: Muscle size and  bulk are normal for age. Hands: There is no tremor of his hands today. Phalangeal and metacarpophalangeal joints are normal. Palmar muscles are normal. Palmar skin is normal. Palmar moisture is also normal. Legs: Muscles appear normal for age. No edema is present. Feet: 2+ right DP pulse, 2+ left DP pulse. Neurologic: Strength is normal for age in both the upper and lower extremities. Muscle tone is normal. Sensation to touch is normal in both legs and both feet.   LAB DATA:   Labs 07/12/19: HbA1c 6.4%, CBG 231  Labs 04/26/19: HbA1c 6.8%, CBG 230  Labs 12/30/18: TSH 2.07, free T4 1.1, free T3 3.2, TPO antibody <1, thyroglobulin antibody <1; CMP normal, except glucose 191; cholesterol 165, triglycerides 138, HDL 33, LDL 106; urinary microalbumin/creatinine ratio 5; vitamin B1 10 (ref 8-30), vitamin B6 3.4 (ref 2.1-21.7), vitamin B12 451 (ref [id number])  Labs 11/29/18: CBG 179  Labs 11/29/18: CMP with glucose 198, chloride 109 (ref 96-106, CO2 17 (ref 20-29); CBC with WBC 11.6 (ref 3.4-10.8), PMNs 8.6 (ref 1.4-7.0), and monocytes 1.0 (ref 0.1-0.9)  Labs 10/28/18: HbA1c 6.9%, CBG 155  Labs 10/06/18: ANA titer negative, ANCA titers negative, rheumatoid factor negative, cyclic citrullin peptide 7 (ref 0-190  Labs 07/27/18: CBG 193  Labs 05/31/18: HbA1c 6.6%, CBG 202  Labs 03/18/18: CBG 178  Labs 01/13/18: HbA1c 6.8%, CBG 194; TSH 0.98, free T4 1.3; free T3 3.6; CMP normal except glucose 191; urinary microalbumin/creatinine ratio 35 (ref <30)  Labs 11/17/17: CBG 170  Labs 10/13/17: HbA1c 6.8%, CBG 191  Labs 08/24/17: CBG 117  Labs 07/20/17: CBG 216  Labs 07/08/17: IgA, IgG, and IgM normal. CBC normal.  Labs 06/16/17: HbA1c 6.5%, CBG 193  Labs 05/15/17: CBG 209; TSH 0.90, free T4 1.2, free T3 3.8  Labs 03/05/17: HbA1c 7.3%, CBG 241  Labs 02/20/17: CBG 180 fasting  Labs 01/29/17: CBG 148  Labs 01/12/17: CBG 116  Labs 12/29/16: HbA1c 9.1%, CBG 223; TSH 0.62, free T4 1.1, free T3 3.5; urine  microalbumin/creatinine ratio 22; cholesterol 171, triglycerides 93, HDL 40, LDL 112;  CMP glucose 201  Labs 09/26/16: HbA1c 9.5%  Labs 08/13/16: Celiac panel negative; CMP normal except for glucose 207; CBC normal  Labs 06/13/16: HbA1c 9.8%; LH 3.6, FSH 2.3, testosterone 533 (ref 250-827), free testosterone 59.4 (ref 47-244)  Labs 02/11/16: HbA1c 9.2%  Labs 01/07/16: HbA1c 10.5%; CBC with Hgb elevated at 17.2%; LH 3.9, FSH 2.0; TSH 1.53, free T4 1.3, free T3 3.7; cholesterol 262, triglycerides 171, HDL 40, LDL 188; urinary microalbumin/creatinine ratio 31; CMP normal except for glucose 181  Labs 06/29/15. HbA1c 9.6%  Labs 12/28/14: Hemoglobin A1c 9.5% today, compared with 9.5%, at last visit and with 9.9% at the prior visit.  Labs 08/03/14: HbA1c 9.5%; TSH 0.548,free T4 1.10, free T3 3.7, TPO antibody < 1, TSI 30; CMP normal except glucose 191; urinary microalbumin/creatinine ratio 27; cholesterol 260, triglycerides 90, HDL 51, LDL 191   Labs 10/05/13: HbA1c 9.9%  Labs 04/23/13: CMP normal, except glucose 144; cholesterol 216, triglycerides 97, HDL 39, LDL 158; urinary  microalbumin/creatinine ratio 6.9; TSH 0.505, free T4 1.17, free T3 3.8  Labs: 01/21/12: TSH 1.251, free T4 1.10, free T3 3.6, CBC normal, CMP normal except for glucose 206, urinary microalbumin/creatinine ratio 6.3, cholesterol 187, triglycerides 67, HDL 45, LDL 129   Assessment and Plan:   ASSESSMENT:  1. T1DM:   A. His BGs are a bit higher, but his HbA1c is lower, reflecting that his DM control was a bit better one month ago than this month. His illnesses are also better, which is a major factor in his BG control    B. Almost all of his higher BGs in the past month were due to higher carb intake than he sometimes recognized or to new sites taking several hours to mature.   C. The combination of the Medtronic 670G pump and Guardian 3 sensor is helping him achieve even better BG control.   D. [name] has done very well at  trying to optimize his BG control. He has increased his carb intake recently.  2. Hypoglycemia: He has had no BGs <80 and no SGs <70. 3. Hypertension: BP is good today. He needs to continue to take meds regularly and to exercise regularly when he feels well enough to do so.  4. Combined hyperlipidemia: His lipids in March 2017 were too high, but the lipids in March 2018 were much better on atorvastatin. He discontinued the atorvastatin in mid-2018 due to concern that this medication might be causing his neuromuscular symptoms. His lipids in March 2020 were better. We will plan to re-start the atorvastatin in the near future once he becomes stable on immunotherapy.  5. Autonomic neuropathy with tachycardia and gastroparesis: His gastroparesis has improved in parallel with his BG improvement. His heart rate has improved as well. .  6. Obesity: Weight has decreased, in part due to increasing his physical activity.   7-8. Goiter/thyroiditis:   A. His thyroid gland has shrunk back to normal size. His thyroiditis is clinically quiescent. The active thyroiditis that he has had in the past and the  process of waxing and waning of thyroid gland size are c/w evolving Hashimoto's thyroiditis.   B. He was euthyroid in March 2013, but was borderline hyperthyroid in June 2014. He was euthyroid in October 2015, March 2017, March 2018, July 2018, in March 2019, and in March 2020.  9. Fatigue: This problem is better today..   10. Obstructive sleep apnea: He did not meet the criteria for OSA, but because of his snoring a dental appliance was suggested. I recommended that he obtain a dental appliance. Unfortunately, his former dentist no longer accepts [employer], so [name] has to find a new dentist. 62. Adult ADD: He had an appointment with Dr. Carlena Hurl, MD in [place] in November 2018. Dr. Wylene Simmer is waiting for [name]'s medical situation to stabilize before beginning any new psych meds.   12.  Proliferative diabetic retinopathy: This problem is due to his long period of poorly controlled BGs. The retinopathy was worse earlier in July 2019, but better in August 2020.  13. URI/nasal congestion/vertigo/allergic rhinitis and conjunctivitis: He is not having these problems today. His family still wants him to go to the [facility] for a comprehensive evaluation. At this point he is doing better, so he is not interested in pursuing this option.   14. Neuromuscular symptoms and headaches/increased intracranial pressure:   A. It appeared previously that some of [name]'s headaches were classic tension headaches. When he performed cervical stretching exercises  the headache resolved.   B. In the past when Dr. Vickey Huger evaluated Joseph Beach for his vague neuromuscular symptoms, she did not find any signs of any serious neurologic disease. His MRI of his brain was unremarkable. At that time his neuromuscular symptoms had resolved.  C. Later, however, the headaches worsened and some of the neuromuscular symptoms recurred. We now know that he had increased ICP and that his headaches improved for a brief time after the LP. He then developed a typical spinal tap headache.  D. In retrospect he may have had intermittent problems with elevated ICP previously.  E. Dr. Vickey Huger suspects that [name]'s ICP was due to the doxycycline. She will follow this issue over time.   F. He appeared to have problems taking his prior dose of Diamox, but is doing better on the combination of a lower dose of Diamox and Topamax.  15. Calcified lymph node: It is possible that he may have contracted a fungus while he was working in the [employer] several years ago. His thyroid US showed his goiter, but no intrinsic thyroid abnormality. A partially calcified cervical lymph node on the right was noted, c/w a post infections/inflammatory node. 16. Microalbuminuria: His ratio was mildly elevated in March 2019. He needed tighter BG and BP  control. His ratio in March 2020 was very good.  17. Anhedonia: Joseph Beach is not depressed today. He feels better about feeling better. His sessions with Ms Baltazar Apo, RN, MSN, a psychiatric nurse specialist, have been very beneficial. He still needs to find more sources of fun.   PLAN:  1. Diagnostic:  Repeat HbA1c and CBG  today. I again reviewed his annual fasting surveillance  results from March..  2. Therapeutic: Follow DM care plan. Continue losartan. Continue his current pump basal rates, ISFs, ICRs, and BG targets.   Basal rates: basal 1 MN:1.20 6 AM: 1.90 2 PM: 1.60 8 PM: 1.65   ISFs: 35   ICRs: MN: 7 8 AM: 4.5 11 AM: 4.5 9 PM: 7   BG targets:  MN: 130-150 6 AM: 120 10 PM 130-150  3. Patient education:   A. We discussed the illnesses that he has had for the past two years.  We also discussed the need for him to continue to be followed closely by Drs Wylene Simmer, Kozlow, Dohmeier, Rankin, Denita Lung, Ms [place]flat, and Dr. Shane Crutch when he can again do so. .   B.  I recommended finding some new sources of fun.  4. Follow-up:  2 months   Level of Service: This visit lasted in excess of 55 minutes. More than 50% of the visit was devoted to counseling.  Molli Knock, MD, CDE Adult and Pediatric Endocrinology  07/12/2019 9:48 AM

## 2019-07-29 ENCOUNTER — Other Ambulatory Visit (INDEPENDENT_AMBULATORY_CARE_PROVIDER_SITE_OTHER): Payer: Self-pay | Admitting: "Endocrinology

## 2019-08-16 LAB — HM DIABETES EYE EXAM

## 2019-08-23 ENCOUNTER — Other Ambulatory Visit (INDEPENDENT_AMBULATORY_CARE_PROVIDER_SITE_OTHER): Payer: Self-pay | Admitting: "Endocrinology

## 2019-08-28 ENCOUNTER — Other Ambulatory Visit (INDEPENDENT_AMBULATORY_CARE_PROVIDER_SITE_OTHER): Payer: Self-pay | Admitting: "Endocrinology

## 2019-08-29 NOTE — Telephone Encounter (Signed)
Are you filling this?

## 2019-08-31 ENCOUNTER — Telehealth (INDEPENDENT_AMBULATORY_CARE_PROVIDER_SITE_OTHER): Payer: Self-pay | Admitting: "Endocrinology

## 2019-08-31 ENCOUNTER — Encounter (INDEPENDENT_AMBULATORY_CARE_PROVIDER_SITE_OTHER): Payer: Self-pay | Admitting: *Deleted

## 2019-08-31 NOTE — Telephone Encounter (Signed)
mychart message sent

## 2019-08-31 NOTE — Telephone Encounter (Signed)
  Who's calling (name and relationship to patient) : [name], patient  Best contact number: [phone]  Provider they see: Dr. Tobe Sos   Reason for call: Patient needs medication called alprazolam 0.25 mg tablet refilled. Has one more dose left.  Please advise.     PRESCRIPTION REFILL ONLY  Name of prescription: Alprazolam 0.25 mg tablet  Pharmacy: [facility] [phone] - [place], [place] at [facility] [address]

## 2019-09-12 ENCOUNTER — Other Ambulatory Visit: Payer: Self-pay

## 2019-09-12 ENCOUNTER — Ambulatory Visit (INDEPENDENT_AMBULATORY_CARE_PROVIDER_SITE_OTHER): Payer: 59 | Admitting: "Endocrinology

## 2019-09-12 DIAGNOSIS — R140 Abdominal distension (gaseous): Secondary | ICD-10-CM

## 2019-09-12 DIAGNOSIS — R000 Tachycardia, unspecified: Secondary | ICD-10-CM | POA: Diagnosis not present

## 2019-09-12 DIAGNOSIS — I10 Essential (primary) hypertension: Secondary | ICD-10-CM

## 2019-09-12 DIAGNOSIS — E1043 Type 1 diabetes mellitus with diabetic autonomic (poly)neuropathy: Secondary | ICD-10-CM

## 2019-09-12 DIAGNOSIS — E1065 Type 1 diabetes mellitus with hyperglycemia: Secondary | ICD-10-CM | POA: Diagnosis not present

## 2019-09-12 DIAGNOSIS — E11649 Type 2 diabetes mellitus with hypoglycemia without coma: Secondary | ICD-10-CM | POA: Diagnosis not present

## 2019-09-12 NOTE — Progress Notes (Signed)
Subjective:  Patient Name: [name] Date of Birth: 11-15-1980  MRN: [id number]  [name]  presents at his WebEx visit today for follow-up of his type 1 diabetes mellitus, goiter, obesity, combined hyperlipidemia, adult ADHD, fatigue, autonomic neuropathy, tachycardia, hypertension, microalbuminuria, hypoglycemia, sinusitis, bronchitis, allergies, proliferative retinopathy, and pseudotumor cerebri/increased intracranial hypertension.   HISTORY OF PRESENT ILLNESS:   [name] is a 38 y.o. Caucasian gentleman. [name] was unaccompanied.  1. The patient was first referred to me on 01/08/2006 by his family physician, Dr. Roanna Epley, for evaluation and management of type 1 diabetes mellitus and related problems. The patient was 38 years old.  A. [name] had been diagnosed with type 1 diabetes somewhere in 2001-2002, at the age of 60-19. He was initially diagnosed with type 2 diabetes mellitus, but was later re-classified as type 1 diabetes mellitus. He had been followed by Dr. Arther Dames, staff endocrinologist at [facility]. [name] was started on an insulin pump approximately 18 months prior to first seeing me. His pump was a Medtronic Paradigm 712. His blood glucose control was fair-to-poor. He was frequently not checking blood sugars or taking insulin boluses as often as he needed to. He frequently noted fast heart rate. The patient's past medical history was positive for hypertension, for which he was taking lisinopril, and for what his mother called "extreme ADHD". He had previously been taking ADHD medicines but had discontinued them due to adverse effects. He had prior ankle injuries and knee injuries, to include tears of the lateral menisci bilaterally. He had not had any surgeries. He was working for a [employer] that had many different job sites in different parts of the [place] and in other countries as well. 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 He did not use tobacco or drugs, but did drink alcohol occasionally.  B. On physical examination, his weight was 234 pounds, his height was 70 inches, his BMI was 33.2. Blood pressure was 128/70. Hemoglobin A1c was 9.5%. Heart rate was 96. He had normal affect and fair insight. He had a 25-30 gram goiter. He had normal 1+ DP pulses in his feet. He had normal sensation in his feet to touch, vibration, and monofilament. His CMP was normal except for glucose of 251. His cholesterol was 257, triglycerides 143, HDL 49, and LDL 179. TSH was 1.017, free T4 was 1.06, and free T3 was 3.3. His urinary microalbumin: creatinine ratio was 20.1 (normal less than 30).   C. The patient clearly needed better blood glucose control, which would only occur if he had better adherence to his plan. He appeared to have autonomic neuropathy, manifested by inappropriate sinus tachycardia.  His thyroid goiter suggested that he might have evolving Hashimoto's disease. He stated that he had lost some weight recently. I encouraged him to check his blood sugars more frequently and to take both correction boluses and food boluses.  2. During the past thirteen years, [name] has had to deal with many different problems:  A. T1DM/hypoglycemia:    1). 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 His hemoglobin A1c values had varied from 8.3-11.2%.   2). After starting him on a Medtronic 670G insulin pump and Guardian 3 sensor on 01/12/17, his BG control has improved significantly, despite the illnesses described below.  B. DM complications: .   1). He has bilateral diabetic retinopathy and is followed by his retinologist, Dr. Verdell Carmine, MD.   2). He has autonomic neuropathy manifested by inappropriate sinus tachycardia and gastroparesis causing postprandial bloating and GERD. These  problems have varied inversely with his degree of BG control.   C. Combined hyperlipidemia:     1). On 12/03/10 his total cholesterol was 270, triglycerides 94, HDL 50, and LDL 201. I started him on Crestor, 10 mg per day. He has since begun treatment with atorvastatin and his cholesterol values had improved.    2). On 12/29/16 his lipids had improved, but his cholesterol values were still elevated. After developing the illnesses noted below, his atorvastatin was discontinued due to concerns that he might be having adverse reactions to the atorvastatin.    D. Allergies, sinusitis, bronchitis, headaches, muscle pains/spasms:   1). Unfortunately, beginning in about June 2018, [name] began to develop recurrent episodes of allergic rhinitis, head fullness, sinusitis, and bronchitis. He also developed headaches, muscle pains and spasms, lethargy, fatigue, and weakness that were presumably due in large part to severe allergies. His atorvastatin and several other medications were stopped at that time. Despite receiving excellent care from his allergist, to include allergy injections and biologic injections, [name]'s allergic issues persisted.    2). In the past 4-6 months these illnesses/problems have largely resolve, in part due to taking a monthly biologic allergy injection.   E. Increased intracranial pressure:    1). [name] was seen in consultation by Dr. Melvyn Novas, neurology, on 05/28/17 for headaches, tremors of his right hand and foot, shaky vision, and symptoms c/w OSA. His initial sleep study did not show any significant amount of OSA.    2). On 03/08/18, Dr.Dohmeier was notified that [name]'s eye doctor had seen signs of fluid build up in his right eye. An LP was performed in radiology on 03/09/18. Opening pressure was 28 cm of water, c/w increased intracranial pressure and pseudotumor cerebri. This problem was initially though to be due to tetracycline usage, so TCN was discontinued and Diamox begun. The Diamox  doses have been changed over time. Many of his neuromuscular symptoms have improved since then.   G. Adult ADD/depression: [name] was evaluated by Dr Piedad Climes, a noted neuropsychiatrist in [place] in 2018. At his last follow up visit in November 2018, Dr. Shane Crutch decided to wait until [name]'s overall health situation  stabilized before trying any new medications.   H. General physical and emotional health: [name] has been very sick for much of the past 2 years. He has been unable to work full-time and has become almost confined to his home. He has become increasingly anhedonic over time.    3. [name]'s last [facility] visit was on 07/12/2019. At that visit we continued his insulin pump settings.    A. In the interim he has been healthy and is feeling "good" overall. His allergies have not been acting up too much recently. He has been trying to avoid allergens and exposure to covid-19. He uses his treadmill inside and wears a mask when he goes outside.   B. He has talked with his therapist, Ms. [facility], every week. He is using alprazolam, 0.25 mg at bedtime when needed for anxiety. His PCP will prescribe the medication for him.  He thinks the medication and the therapy are helping him.   C. On 01/03/19 he had an LP that showed elevated pressure. His diamox dose was increased somewhat.  Dr. Vickey Huger also started him on Topamax. He had a follow up visit with Dr. Vickey Huger on 02/16/19. She continued him on low doses of both medications. He still has some adverse effects of the Diamox, but not as bad. He still has some mild fatigue.  D. He was treated with nitrofurantoin for acute cystitis on 01/28/19.He has been asymptomatic since then.   E. He saw Dr. Luciana Axe in October 2020. There were no new signs of diabetic eye disease.   F. He saw Dr. Sharyn Lull on 03/15/19. Ginette Pitman was noted and treated.   G. His nausea still occurs about every two weeks, but is much better on omeprazole.   H. He has not had any allergy  injections for 7-8 months, but has continued to take his monthly biologic injection.    I. He continues to have problems with lactose intolerance. When he avoids lactose, however, he is asymptomatic. Some foods such as shellfish cause his throat to swell up.   J. He uses Humalog aspart insulin in his insulin pump. He takes losartan, 50 mg/day. He rarely has any coughing unless his allergies act up. .    K. His second sleep study was not bad enough to start on C-pap.    L. His BGs have varied with his illnesses, with steroid usage, and with the amount of postprandial bloating and constipation that he has. Fortunately, the bloating and constipation have improved in parallel with improvements in his BG control. He no longer has to delay his boluses very often, because he is not as worried that his gastric emptying will be too delayed and cause hypoglycemia.     4. Pertinent Review of Systems: Constitutional: [name] feels "okay, better than I was a year ago, but not amazing". "My energy is fairly good." This is the longest I've gone without being sick." He is still tired, "about the same". He is still often sore and achy, maybe more so since resuming physical exercise. Headaches are "about the same". "They come and go." The visual color changes, muscle spasms, and tingling that were attributed to Diamox now bother him only "occasionally" since reducing the Diamox dose.  Eyes: As above. His vision is usually good with his glasses. His eyes tire easily. He had an eye exam in October 2020. There were no new signs of DM damage.  Neck: He has not had any intermittent anterior neck swelling and tenderness for months.  Lungs: Breathing has been "alright", but he still sometimes has SOB with exertion.  Heart: No problems Gastrointestinal: As above. He does not have much postprandial bloating or reflux. The patient has no other complaints of excessive hunger, heartburn, upset stomach, stomach aches or pains, or  diarrhea while taking omeprazole, 20 mg, daily.  Legs: As above. He occasionally still has the sensation of vibration in his legs, but now only about once a month. These sensations may last for up to a few hours. Muscle mass and strength seem normal. There are no other complaints of numbness, tingling, burning, or pain. No edema is noted. Feet: There are no obvious foot problems now. He still has episodic foot pains, but not often. There are no complaints of numbness, or burning.  No edema is noted. Hypoglycemia: He rarely has low BGs.  Emotional: "I'm doing the best that can be expected."    5. BG printout: We do not have any BG data today. At his last visit, we had data from the past 4 weeks. [name] changes sites every  1-3 days. He checks BGs 3-6 times daily, average 3.7 times per day. Average BG is 163, compared with 155 at his last visit and with 177 at his prior visit. BGs varied from 80-350, compared with 88-250, at his last visit and with 96-324 at his prior visit. His higher BGs occurred when he was in manual mode or soon after some site changes. His BGs tend to be lower from 8-9 PM.   6. CGM printout: We do not have any SG data today. He says that most of his SGs are <200. He has had only two SGs in the upper 200s, both after changing sites. He has not had many, if any, low SGs.   At his last visit we had data from the past 4 weeks. Skin glucose values and BG values correlate very well. Average SG was 151, compared with 146 at his last visit and with 155 at his prior visit  He was in auto mode 99% of the time, compared with 95% of the time at hs last visit. He was in zone 81% of the time, compared with 85% of the time at his last visit. His highest SGs occurred after lunch and dinner. He still sometimes consumes more carbs than he realizes and has been consuming about 11% more carbs overall. When his site is working well and his health is pretty good, SGs vary from the 90s to the 170s.     PAST  MEDICAL, FAMILY, AND SOCIAL HISTORY:  Past Medical History:  Diagnosis Date  . ADHD (attention deficit hyperactivity disorder)   . Autonomic neuropathy due to diabetes (HCC)   . Diabetes (HCC)   . Fatigue   . Goiter   . High blood pressure   . High cholesterol   . Hypercholesterolemia   . Hypertension   . Hypoglycemia associated with diabetes (HCC)   . Obesity   . Tachycardia   . Type 1 diabetes mellitus not at goal [facility])   . Uncontrolled DM with microalbuminuria or microproteinuria     Family History  Problem Relation Age of Onset  . Dementia Paternal Aunt   . Schizophrenia Paternal Aunt   . Cancer Maternal Grandmother   . Diabetes Maternal Grandmother        T2 DM  . Cancer Maternal Grandfather   . Heart disease Father   . Thyroid disease Neg Hx      Current Outpatient Medications:  .  acetaminophen (TYLENOL) 500 MG tablet, Take 1,000 mg by mouth 2 (two) times daily as needed., Disp: , Rfl:  .  acetaZOLAMIDE (DIAMOX) 500 MG capsule, TAKE 2 CAPSULES BY MOUTH DAILY IN THE MORNING AND 1 CAPSULE IN EVENING, Disp: 90 capsule, Rfl: 5 .  ALPRAZolam (XANAX) 0.25 MG tablet, Take one tablet at bedtime as needed., Disp: 30 tablet, Rfl: 3 .  atorvastatin (LIPITOR) 20 MG tablet, , Disp: , Rfl: 0 .  HUMALOG 100 UNIT/ML injection, INJECT 300 UNITS UNDER THE SKIN VIA INSULIN PUMP EVERY 48 TO 72 HOURS PER HYPERGLYCEMIA AND DKA PROTOCOLS, Disp: 120 mL, Rfl: 3 .  losartan (COZAAR) 50 MG tablet, TAKE 1 TABLET(50 MG) BY MOUTH DAILY, Disp: 90 tablet, Rfl: 1 .  montelukast (SINGULAIR) 10 MG tablet, TAKE 1 TABLET(10 MG) BY MOUTH AT BEDTIME, Disp: 30 tablet, Rfl: 3 .  nystatin (MYCOSTATIN) 100000 UNIT/ML suspension, Swish and swallow 5 mls daily, Disp: 150 mL, Rfl: 2 .  omeprazole (PRILOSEC) 20 MG capsule, Take 20 mg by mouth daily., Disp: , Rfl:  .  ONETOUCH ULTRA test strip, USE TO CHECK BLOOD SUGAR 6 TIMES DAILY AS DIRECTED, Disp: 600 strip, Rfl: 1 .  topiramate (TOPAMAX) 50 MG tablet, Take 1  tablet (50 mg total) by mouth 2 (two) times daily., Disp: 60 tablet, Rfl: 5 .  albuterol (PROAIR HFA) 108 (90 Base) MCG/ACT inhaler, Inhale 2 puffs into the lungs every 6 (six) hours as needed for wheezing or shortness of breath. (Patient not taking: Reported on 04/26/2019), Disp: 1 Inhaler, Rfl: 2 .  albuterol (PROVENTIL) (2.5 MG/3ML) 0.083% nebulizer solution, Take 3 mLs (2.5 mg total) by nebulization every 4 (four) hours as needed for wheezing or shortness of breath. (Patient not taking: Reported on 04/26/2019), Disp: 75 mL, Rfl: 1 .  ASMANEX HFA 200 MCG/ACT AERO, INHALE 2 PUFFS INTO THE LUNGS TWICE DAILY (Patient not taking: Reported on 04/26/2019), Disp: 13 g, Rfl: 5 .  AUVI-Q 0.3 MG/0.3ML SOAJ injection, Use as directed for life-threatening allergic reaction. (Patient not taking: Reported on 04/26/2019), Disp: 4 Device, Rfl: 2 .  budesonide (PULMICORT) 0.5 MG/2ML nebulizer solution, Take 2 mLs (0.5 mg total) by nebulization as directed. (Patient not taking: Reported on 04/26/2019), Disp: 2 mL, Rfl: 3 .  budesonide-formoterol (SYMBICORT) 160-4.5 MCG/ACT inhaler, Inhale 2 puffs into the lungs 2 (two) times daily. (Patient not taking: Reported on 04/26/2019), Disp: 1 Inhaler, Rfl: 5 .  glucagon 1 MG injection, Follow package directions for low blood sugar., Disp: 1 each, Rfl: 3  Current Facility-Administered Medications:  [name]  Mepolizumab SOLR 100 mg, 100 mg, Subcutaneous, Q28 days, Alfonse Spruce, MD, 100 mg at 02/16/19 1018  Facility-Administered Medications Ordered in Other Visits:  .  gadopentetate dimeglumine (MAGNEVIST) injection 20 mL, 20 mL, Intravenous, Once PRN, Dohmeier, Porfirio Mylar, MD  Allergies as of 09/12/2019 - Review Complete 07/12/2019  Allergen Reaction Noted  . Flonase [fluticasone] Shortness Of Breath 06/10/2017  . Molds & smuts Other (See Comments) 03/09/2018  . Sulfites Itching and Swelling 02/04/2018  . Tetracyclines & related Other (See Comments) 03/09/2018    1. Work and  Family: He is working part-time at home, but not as much as at his last visit. 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 He hopes to return to full-time work soon. 2. Activities: He started walking again, about 4 times per week for about 30 minutes each time. 3. Smoking, alcohol, or drugs: He has not been drinking any alcohol or beer. No tobacco or drugs. 4. Primary Care Provider: Dr. Guerry Bruin, MD, [facility] 5. Neurology: Dr. Vickey Huger 6. Ophthalmology:  7. Retinology: Dr. Luciana Axe 8. Urology: Dr. Annabell Howells at [facility] 9. Sports medicine: Dr. Darrick Penna 10. Therapy: Ms Baltazar Apo, RN, MSN  REVIEW OF SYSTEMS: There are no other significant problems involving [name]'s other body systems.   Objective:  Vital Signs:  There were no vitals taken for this visit.   Ht Readings from Last 3 Encounters:  07/12/19 5' 10.91" (1.801 m)  04/12/19 6' (1.829 m)  12/27/18 6' (1.829 m)   Wt Readings from Last 3 Encounters:  07/12/19 226 lb 12.8 oz (102.9 kg)  04/26/19 230 lb 6.4 oz (104.5 kg)  04/12/19 229 lb (103.9 kg)   PHYSICAL EXAM:  His last weight at home was yesterday. He weighed 226 pounds.   Constitutional: The patient appears healthy and upbeat today. His weight has remained the same. His affect is normal today. His insight is normal. His sense of humor is good. He engaged very well today.  LAB DATA:   Labs 07/12/19: HbA1c 6.4%, CBG 231  Labs 04/26/19: HbA1c 6.8%, CBG 230  Labs 12/30/18: TSH 2.07, free T4 1.1, free T3 3.2, TPO antibody <1, thyroglobulin antibody <1; CMP normal, except glucose 191; cholesterol 165, triglycerides 138, HDL 33, LDL 106; urinary microalbumin/creatinine ratio 5; vitamin B1 10 (ref 8-30), vitamin B6 3.4 (ref 2.1-21.7), vitamin B12 451 (ref [id number])  Labs 11/29/18: CBG 179  Labs 11/29/18: CMP with glucose 198, chloride 109 (ref 96-106, CO2 17 (ref 20-29); CBC with WBC 11.6 (ref 3.4-10.8), PMNs 8.6 (ref 1.4-7.0),  and monocytes 1.0 (ref 0.1-0.9)  Labs 10/28/18: HbA1c 6.9%, CBG 155  Labs 10/06/18: ANA titer negative, ANCA titers negative, rheumatoid factor negative, cyclic citrullin peptide 7 (ref 0-190  Labs 07/27/18: CBG 193  Labs 05/31/18: HbA1c 6.6%, CBG 202  Labs 03/18/18: CBG 178  Labs 01/13/18: HbA1c 6.8%, CBG 194; TSH 0.98, free T4 1.3; free T3 3.6; CMP normal except glucose 191; urinary microalbumin/creatinine ratio 35 (ref <30)  Labs 11/17/17: CBG 170  Labs 10/13/17: HbA1c 6.8%, CBG 191  Labs 08/24/17: CBG 117  Labs 07/20/17: CBG 216  Labs 07/08/17: IgA, IgG, and IgM normal. CBC normal.  Labs 06/16/17: HbA1c 6.5%, CBG 193  Labs 05/15/17: CBG 209; TSH 0.90, free T4 1.2, free T3 3.8  Labs 03/05/17: HbA1c 7.3%, CBG 241  Labs 02/20/17: CBG 180 fasting  Labs 01/29/17: CBG 148  Labs 01/12/17: CBG 116  Labs 12/29/16: HbA1c 9.1%, CBG 223; TSH 0.62, free T4 1.1, free T3 3.5; urine microalbumin/creatinine ratio 22; cholesterol 171, triglycerides 93, HDL 40, LDL 112;  CMP glucose 201  Labs 09/26/16: HbA1c 9.5%  Labs 08/13/16: Celiac panel negative; CMP normal except for glucose 207; CBC normal  Labs 06/13/16: HbA1c 9.8%; LH 3.6, FSH 2.3, testosterone 533 (ref 250-827), free testosterone 59.4 (ref 47-244)  Labs 02/11/16: HbA1c 9.2%  Labs 01/07/16: HbA1c 10.5%; CBC with Hgb elevated at 17.2%; LH 3.9, FSH 2.0; TSH 1.53, free T4 1.3, free T3 3.7; cholesterol 262, triglycerides 171, HDL 40, LDL 188; urinary microalbumin/creatinine ratio 31; CMP normal except for glucose 181  Labs 06/29/15. HbA1c 9.6%  Labs 12/28/14: Hemoglobin A1c 9.5% today, compared with 9.5%, at last visit and with 9.9% at the prior visit.  Labs 08/03/14: HbA1c 9.5%; TSH 0.548,free T4 1.10, free T3 3.7, TPO antibody < 1, TSI 30; CMP normal except glucose 191; urinary microalbumin/creatinine ratio 27; cholesterol 260, triglycerides 90, HDL 51, LDL 191   Labs 10/05/13: HbA1c 9.9%  Labs 04/23/13: CMP normal, except glucose 144;  cholesterol 216, triglycerides 97, HDL 39, LDL 158; urinary microalbumin/creatinine ratio 6.9; TSH 0.505, free T4 1.17, free T3 3.8  Labs: 01/21/12: TSH 1.251, free T4 1.10, free T3 3.6, CBC normal, CMP normal except for glucose 206, urinary microalbumin/creatinine ratio 6.3, cholesterol 187, triglycerides 67, HDL 45, LDL 129   Assessment and Plan:   ASSESSMENT:  1. T1DM:   A. His SGs have been mostly <200, reflecting fairly good DM.     [name] has done very well at trying to optimize his BG control.   2. Hypoglycemia: He has not had many, if any, SGs <70. 3. Hypertension: BP was good at his last visit.   4. Combined hyperlipidemia: His lipids in March 2017 were too high, but the lipids in March 2018 were much better on atorvastatin. He discontinued the atorvastatin in mid-2018 due to concern that this medication might be causing his neuromuscular symptoms. His lipids in March 2020 were better. We will plan  to re-start the atorvastatin in the near future once he becomes stable on immunotherapy.  5. Autonomic neuropathy with tachycardia and gastroparesis: His gastroparesis has improved in parallel with his BG improvement. His heart rate has improved as well. .  6. Obesity: Weight has remained stable, but is still too high.    7-8. Goiter/thyroiditis:   A. His thyroid gland has shrunk back to normal size at his last visit. His thyroiditis was clinically quiescent. The active thyroiditis that he has had in the past and the  process of waxing and waning of thyroid gland size are c/w evolving Hashimoto's thyroiditis.   B. He was euthyroid in March 2013, but was borderline hyperthyroid in June 2014. He was euthyroid in October 2015, March 2017, March 2018, July 2018, in March 2019, and in March 2020.  9. Fatigue: This problem is better today..   10. Obstructive sleep apnea: He did not meet the criteria for OSA, but because of his snoring a dental appliance was suggested. I recommended that he obtain a  dental appliance. Unfortunately, his former dentist no longer accepts [employer], so [name] has to find a new dentist. 11. Adult ADD: He had an appointment with Dr. Piedad Climes, MD in [place] in November 2018. Dr. Shane Crutch is waiting for [name]'s medical situation to stabilize before beginning any new psych meds.   12. Proliferative diabetic retinopathy: This problem is due to his long period of poorly controlled BGs. The retinopathy was worse earlier in July 2019, but better in August and October 2020.  13. URI/nasal congestion/vertigo/allergic rhinitis and conjunctivitis: He is not having these problems today. His family still wants him to go to the [facility] for a comprehensive evaluation. At this point he is doing better, so he is not interested in pursuing this option.   14. Neuromuscular symptoms and headaches/increased intracranial pressure:   A. It appeared previously that some of [name]'s headaches were classic tension headaches. When he performed cervical stretching exercises the headache resolved.   B. In the past when Dr. Vickey Huger evaluated [name] for his vague neuromuscular symptoms, she did not find any signs of any serious neurologic disease. His MRI of his brain was unremarkable. At that time his neuromuscular symptoms had resolved.  C. Later, however, the headaches worsened and some of the neuromuscular symptoms recurred. We now know that he had increased ICP and that his headaches improved for a brief time after the LP. He then developed a typical spinal tap headache.  D. In retrospect he may have had intermittent problems with elevated ICP previously.  E. Dr. Vickey Huger suspects that [name]'s ICP was due to the doxycycline. She will follow this issue over time.   F. He appeared to have problems taking his prior dose of Diamox, but is doing better on the combination of a lower dose of Diamox and Topamax.  15. Calcified lymph node: It is possible that he may have contracted a  fungus while he was working in the [employer] several years ago. His thyroid US showed his goiter, but no intrinsic thyroid abnormality. A partially calcified cervical lymph node on the right was noted, c/w a post infections/inflammatory node. 16. Microalbuminuria: His ratio was mildly elevated in March 2019. He needed tighter BG and BP control. His ratio in March 2020 was very good.  17. Anhedonia: [name] is not depressed today. He feels better, but wants to be normal. His sessions with Ms Baltazar Apo, RN, MSN, a psychiatric nurse specialist, have been  very beneficial. He still needs to find more sources of fun.   PLAN:  1. Diagnostic:  Repeat HbA1c and CBG at his next visit. Please call [name] and send in your CGM data for review.   2. Therapeutic: Follow DM care plan. Continue losartan. Continue his current pump basal rates, ISFs, ICRs, and BG targets.   Basal rates: basal 1 MN:1.20 6 AM: 1.90 2 PM: 1.60 8 PM: 1.65   ISFs: 35   ICRs: MN: 7 8 AM: 4.5 11 AM: 4.5 9 PM: 7   BG targets:  MN: 130-150 6 AM: 120 10 PM 130-150  3. Patient education:   A. We discussed the illnesses that he has had for the past two years.  We also discussed the need for him to continue to be followed closely by Drs Wylene Simmer, Kozlow, Dohmeier, Rankin, Denita Lung, Ms [place]um[place], and Dr. Shane Crutch when he can again do so. .   B.  I recommended finding some new sources of fun.  4. Follow-up:  2 months   Level of Service: This visit lasted in excess of 55 minutes. More than 50% of the visit was devoted to counseling.  Molli Knock, MD, CDE Adult and Pediatric Endocrinology   This is a Pediatric Specialist E-Visit follow up consult provided via WebEx. [name] consented to an E-Visit consult today.  Location of patient: [name] is at his home. Location of provider: Armanda Magic is at his office.  Patient was referred by [name], PA-C   The following participants  were involved in this E-Visit: [name] and Dr. Fransico Karlisa Gaubert  Chief Complain/ Reason for E-Visit today: T1DM, hypoglycemia, autonomic neuropathy, postprandial bloating, inappropriate sinus tachycardia, goiter, thyroiditis, anhedonia. Total time on call: 40 minutes Follow up: 2 months    09/12/2019 9:49 AM

## 2019-09-12 NOTE — Patient Instructions (Signed)
Follow up visit in 2 months.  

## 2019-10-06 ENCOUNTER — Other Ambulatory Visit (INDEPENDENT_AMBULATORY_CARE_PROVIDER_SITE_OTHER): Payer: Self-pay

## 2019-10-06 DIAGNOSIS — E10649 Type 1 diabetes mellitus with hypoglycemia without coma: Secondary | ICD-10-CM

## 2019-10-07 MED ORDER — GLUCAGON (RDNA) 1 MG IJ KIT: PACK | INTRAMUSCULAR | 3 refills | Status: DC

## 2019-10-22 ENCOUNTER — Other Ambulatory Visit: Payer: Self-pay | Admitting: Allergy and Immunology

## 2019-10-22 DIAGNOSIS — Z91018 Allergy to other foods: Secondary | ICD-10-CM

## 2019-10-22 DIAGNOSIS — J3089 Other allergic rhinitis: Secondary | ICD-10-CM

## 2019-10-22 DIAGNOSIS — H6983 Other specified disorders of Eustachian tube, bilateral: Secondary | ICD-10-CM

## 2019-10-24 ENCOUNTER — Encounter (INDEPENDENT_AMBULATORY_CARE_PROVIDER_SITE_OTHER): Payer: Self-pay

## 2019-11-01 ENCOUNTER — Telehealth: Payer: Self-pay

## 2019-11-01 NOTE — Telephone Encounter (Signed)
Patient called having questions about getting the COVID-19 vaccine and wanted to know if Dr. Lucie Leather is willing to do a tele-visit to see if he qualifies to receive the injection early and what all he needs to do. Patient is requesting only to speak with Dr. Lucie Leather and is wanting a tele-visit since he has history of breathing complications. I did inform patient that it was ideal to speak with him in person but will see if he will make an exception to do the tele-visit. Can we do the tele-visit Dr. Lucie Leather?

## 2019-11-21 ENCOUNTER — Other Ambulatory Visit: Payer: Self-pay | Admitting: Allergy and Immunology

## 2019-11-21 DIAGNOSIS — Z91018 Allergy to other foods: Secondary | ICD-10-CM

## 2019-11-21 DIAGNOSIS — H6983 Other specified disorders of Eustachian tube, bilateral: Secondary | ICD-10-CM

## 2019-11-21 DIAGNOSIS — J3089 Other allergic rhinitis: Secondary | ICD-10-CM

## 2019-12-13 ENCOUNTER — Ambulatory Visit (INDEPENDENT_AMBULATORY_CARE_PROVIDER_SITE_OTHER): Payer: 59 | Admitting: "Endocrinology

## 2019-12-27 ENCOUNTER — Other Ambulatory Visit: Payer: Self-pay | Admitting: Neurology

## 2019-12-27 ENCOUNTER — Other Ambulatory Visit: Payer: Self-pay

## 2019-12-27 ENCOUNTER — Other Ambulatory Visit: Payer: Self-pay | Admitting: Allergy and Immunology

## 2019-12-27 ENCOUNTER — Ambulatory Visit (INDEPENDENT_AMBULATORY_CARE_PROVIDER_SITE_OTHER): Payer: 59 | Admitting: Family Medicine

## 2019-12-27 ENCOUNTER — Encounter: Payer: Self-pay | Admitting: Family Medicine

## 2019-12-27 VITALS — Ht 71.0 in | Wt 225.0 lb

## 2019-12-27 DIAGNOSIS — K219 Gastro-esophageal reflux disease without esophagitis: Secondary | ICD-10-CM

## 2019-12-27 DIAGNOSIS — Z91018 Allergy to other foods: Secondary | ICD-10-CM

## 2019-12-27 DIAGNOSIS — H6983 Other specified disorders of Eustachian tube, bilateral: Secondary | ICD-10-CM

## 2019-12-27 DIAGNOSIS — J302 Other seasonal allergic rhinitis: Secondary | ICD-10-CM | POA: Diagnosis not present

## 2019-12-27 DIAGNOSIS — J3089 Other allergic rhinitis: Secondary | ICD-10-CM

## 2019-12-27 DIAGNOSIS — J4540 Moderate persistent asthma, uncomplicated: Secondary | ICD-10-CM

## 2019-12-27 MED ORDER — EPINEPHRINE 0.3 MG/0.3ML IJ SOAJ: INTRAMUSCULAR | 1 refills | Status: DC

## 2019-12-27 MED ORDER — AUVI-Q 0.3 MG/0.3ML IJ SOAJ: INTRAMUSCULAR | 2 refills | Status: DC

## 2019-12-27 MED ORDER — MONTELUKAST SODIUM 10 MG PO TABS: 10.0000 mg | ORAL_TABLET | Freq: Every day | ORAL | 5 refills | Status: DC

## 2019-12-27 MED ORDER — TOPIRAMATE 50 MG PO TABS: 50.0000 mg | ORAL_TABLET | Freq: Two times a day (BID) | ORAL | 0 refills | Status: DC

## 2019-12-27 MED ORDER — ALBUTEROL SULFATE HFA 108 (90 BASE) MCG/ACT IN AERS: 2.0000 | INHALATION_SPRAY | RESPIRATORY_TRACT | 1 refills | Status: DC | PRN

## 2019-12-27 NOTE — Patient Instructions (Signed)
Asthma Continue montelukast 10 mg once a day to prevent cough or wheeze For asthma flares, begin Asmanex 200-2 puffs once to twice a day depending on disease activity. Nystatin suspension 5 mL once a day swish and swallow as needed with Asmanex use Continue albuterol 2 puffs every 4 hours as needed for cough or wheeze Continue self-administration of Nucala 100 mg once every 4 weeks   Allergic rhinitis Continue Xyzal 5 mg once a day as needed for a runny nose or itch.  Continue saline nasal rinses as needed for nasal symptoms Continue budesonide 0.5 mg in 100 mL nasal saline rinses once a day as needed for a stuffy nose  Reflux Continue dietary and lifestyle modifications to control reflux Continue omeprazole 20 mg once a day as needed for reflux  Call the clinic if this treatment plan is not working well for you  Follow up in 6 months or sooner if needed.

## 2019-12-27 NOTE — Progress Notes (Signed)
RE: [name] MRN: [id number] DOB: 1981/04/28 Date of Telemedicine Visit: 12/27/2019  Referring provider: Lucila Maine Primary care provider: [name], PA-C  Chief Complaint: Medication Refill, Asthma, and Allergic Rhinitis    Telemedicine Follow Up Visit via Telephone: I connected with [name] for a follow up on 12/27/19 by telephone and verified that I am speaking with the correct person using two identifiers.   I discussed the limitations, risks, security and privacy concerns of performing an evaluation and management service by telephone and the availability of in person appointments. I also discussed with the patient that there may be a patient responsible charge related to this service. The patient expressed understanding and agreed to proceed.  Patient is at home  Provider is at the office.  Visit start time: 1:33 Visit end time: 2:11 Insurance consent/check in by: Jennette Banker Medical consent and medical assistant/nurse:  History of Present Illness: He is a 39 y.o. male, who is being followed for asthma, allergic rhinitis, reflux, and oral candidiasis. His previous allergy office visit was on 03/15/2019 with Dr. Lucie Leather. In the interim, he reports that he is mostly isolating at home, only leaving when necessary. At today's visit, he reports that his asthma has been well controlled with no shortness of breath or wheeze with activity or rest. He reports occasional dry cough occurring with activity. He is currently taking montelukast 10 mg once a day and self injecting Nucala 100 mg once every 4 weeks. He is not currently using Asmanex, Nystatin suspension, or albuterol. Allergic rhinitis is reported as well controlled with only occasional dry or irritated nostrils for which he uses a saline nasal rinse. He is not using nasal steroid rinses at this time. He is interested in restarting allergen immunotherapy after he receives the covid vaccine. Reflux is  reported as well controlled with no current medical intervention. His current medications are listed in the chart.   Assessment and Plan: Asthma Continue montelukast 10 mg once a day to prevent cough or wheeze For asthma flares, begin Asmanex 200-2 puffs once to twice a day depending on disease activity. Nystatin suspension 5 mL once a day swish and swallow as needed with Asmanex use Continue albuterol 2 puffs every 4 hours as needed for cough or wheeze Continue self-administration of Nucala 100 mg once every 4 weeks   Allergic rhinitis Continue Xyzal 5 mg once a day as needed for a runny nose or itch.  Continue saline nasal rinses as needed for nasal symptoms Continue budesonide 0.5 mg in 100 mL nasal saline rinses once a day as needed for a stuffy nose  Reflux Continue dietary and lifestyle modifications to control reflux Continue omeprazole 20 mg once a day as needed for reflux  Call the clinic if this treatment plan is not working well for you  Follow up in 6 months or sooner if needed. Return in about 6 months (around 06/28/2020), or if symptoms worsen or fail to improve.  Meds ordered this encounter  Medications  . montelukast (SINGULAIR) 10 MG tablet    Sig: Take 1 tablet (10 mg total) by mouth at bedtime.    Dispense:  30 tablet    Refill:  5  . AUVI-Q 0.3 MG/0.3ML SOAJ injection    Sig: Use as directed for life-threatening allergic reaction.    Dispense:  2 each    Refill:  2    Patient phone number: [phone].  [name] EPINEPHrine (AUVI-Q) 0.3 mg/0.3 mL IJ SOAJ  injection    Sig: Use as directed for severe allergic reactions    Dispense:  2 each    Refill:  1  . albuterol (PROAIR HFA) 108 (90 Base) MCG/ACT inhaler    Sig: Inhale 2 puffs into the lungs every 4 (four) hours as needed for wheezing or shortness of breath.    Dispense:  18 g    Refill:  1    Medication List:  Current Outpatient Medications  Medication Sig Dispense Refill  . acetaminophen (TYLENOL) 500 MG  tablet Take 1,000 mg by mouth 2 (two) times daily as needed.    [name] acetaZOLAMIDE (DIAMOX) 500 MG capsule TAKE 2 CAPSULES BY MOUTH DAILY IN THE MORNING AND 1 CAPSULE IN EVENING 90 capsule 5  . albuterol (PROAIR HFA) 108 (90 Base) MCG/ACT inhaler Inhale 2 puffs into the lungs every 6 (six) hours as needed for wheezing or shortness of breath. 1 Inhaler 2  . albuterol (PROVENTIL) (2.5 MG/3ML) 0.083% nebulizer solution Take 3 mLs (2.5 mg total) by nebulization every 4 (four) hours as needed for wheezing or shortness of breath. 75 mL 1  . ALPRAZolam (XANAX) 0.25 MG tablet Take one tablet at bedtime as needed. 30 tablet 3  . atorvastatin (LIPITOR) 20 MG tablet Take 20 mg by mouth daily.   0  . AUVI-Q 0.3 MG/0.3ML SOAJ injection Use as directed for life-threatening allergic reaction. 2 each 2  . glucagon 1 MG injection Follow package directions for low blood sugar. 1 each 3  . HUMALOG 100 UNIT/ML injection INJECT 300 UNITS UNDER THE SKIN VIA INSULIN PUMP EVERY 48 TO 72 HOURS PER HYPERGLYCEMIA AND DKA PROTOCOLS 120 mL 3  . levocetirizine (XYZAL) 5 MG tablet Take 5 mg by mouth daily as needed for allergies.     [name] losartan (COZAAR) 50 MG tablet TAKE 1 TABLET(50 MG) BY MOUTH DAILY 90 tablet 1  . montelukast (SINGULAIR) 10 MG tablet Take 1 tablet (10 mg total) by mouth at bedtime. 30 tablet 5  . NUCALA 100 MG/ML SOAJ Inject 100 mg as directed every 28 (twenty-eight) days. Nucala autoinjector at home once a month.    . nystatin (MYCOSTATIN) 100000 UNIT/ML suspension Swish and swallow 5 mls daily (Patient taking differently: Use as directed 5 mLs in the mouth or throat daily as needed (for thrush). Swish and swallow 5 mls daily) 150 mL 2  . omeprazole (PRILOSEC) 20 MG capsule Take 20 mg by mouth daily as needed (for reflux).     [name] ULTRA test strip USE TO CHECK BLOOD SUGAR 6 TIMES DAILY AS DIRECTED 600 strip 1  . topiramate (TOPAMAX) 50 MG tablet Take 1 tablet (50 mg total) by mouth 2 (two) times daily. 60  tablet 0  . albuterol (PROAIR HFA) 108 (90 Base) MCG/ACT inhaler Inhale 2 puffs into the lungs every 4 (four) hours as needed for wheezing or shortness of breath. 18 g 1  . ASMANEX HFA 200 MCG/ACT AERO INHALE 2 PUFFS INTO THE LUNGS TWICE DAILY (Patient not taking: Reported on 04/26/2019) 13 g 5  . budesonide (PULMICORT) 0.5 MG/2ML nebulizer solution Take 2 mLs (0.5 mg total) by nebulization as directed. (Patient not taking: Reported on 04/26/2019) 2 mL 3  . budesonide-formoterol (SYMBICORT) 160-4.5 MCG/ACT inhaler Inhale 2 puffs into the lungs 2 (two) times daily. (Patient not taking: Reported on 04/26/2019) 1 Inhaler 5  . EPINEPHrine (AUVI-Q) 0.3 mg/0.3 mL IJ SOAJ injection Use as directed for severe allergic reactions 2 each 1  . mupirocin  ointment (BACTROBAN) 2 % mupirocin 2 % topical ointment  APP EXT IEN BID     Current Facility-Administered Medications  Medication Dose Route Frequency Provider Last Rate Last Admin  . Mepolizumab SOLR 100 mg  100 mg Subcutaneous Q28 days Valentina Shaggy, MD   100 mg at 02/16/19 1018   Facility-Administered Medications Ordered in Other Visits  Medication Dose Route Frequency Provider Last Rate Last Admin  . gadopentetate dimeglumine (MAGNEVIST) injection 20 mL  20 mL Intravenous Once PRN Dohmeier, Asencion Partridge, MD       Allergies: Allergies  Allergen Reactions  . Flonase [Fluticasone] Shortness Of Breath  . Molds & Smuts Other (See Comments)    Aggravate asthma  . Sulfites Itching and Swelling  . Tetracyclines & Related Other (See Comments)    increased intracranial pressure   I reviewed his past medical history, social history, family history, and environmental history and no significant changes have been reported from previous visit on 03/15/2019.  Objective: Physical Exam Not obtained as encounter was done via telephone.   Previous notes and tests were reviewed.  I discussed the assessment and treatment plan with the patient. The patient was  provided an opportunity to ask questions and all were answered. The patient agreed with the plan and demonstrated an understanding of the instructions.   The patient was advised to call back or seek an in-person evaluation if the symptoms worsen or if the condition fails to improve as anticipated.  I provided 38 minutes of non-face-to-face time during this encounter.  It was my pleasure to participate in [name]'s care today. Please feel free to contact me with any questions or concerns.   Sincerely,  Gareth Morgan, FNP   I have provided oversight concerning Gareth Morgan' evaluation and treatment of this patient's health issues addressed during today's encounter. I agree with the assessment and therapeutic plan as outlined in the note.   Thank you for the opportunity to care for this patient.  Please do not hesitate to contact me with questions.  Penne Lash, M.D.  [facility] [address] [place], [place] [address] [phone]

## 2020-01-16 ENCOUNTER — Other Ambulatory Visit: Payer: Self-pay | Admitting: Neurology

## 2020-01-16 ENCOUNTER — Encounter: Payer: Self-pay | Admitting: Neurology

## 2020-01-16 MED ORDER — ACETAZOLAMIDE ER 500 MG PO CP12: ORAL_CAPSULE | ORAL | 0 refills | Status: DC

## 2020-01-23 ENCOUNTER — Other Ambulatory Visit: Payer: Self-pay | Admitting: Neurology

## 2020-01-24 ENCOUNTER — Telehealth: Payer: Self-pay | Admitting: Neurology

## 2020-01-24 MED ORDER — TOPIRAMATE 50 MG PO TABS: 50.0000 mg | ORAL_TABLET | Freq: Two times a day (BID) | ORAL | 0 refills | Status: DC

## 2020-01-24 NOTE — Telephone Encounter (Signed)
rx refill sent for the patient

## 2020-01-24 NOTE — Telephone Encounter (Signed)
1) Medication(s) Requested (by name): topiramate 2) Pharmacy of Choice:  [facility] on [address]

## 2020-01-31 ENCOUNTER — Other Ambulatory Visit (INDEPENDENT_AMBULATORY_CARE_PROVIDER_SITE_OTHER): Payer: Self-pay | Admitting: "Endocrinology

## 2020-02-07 ENCOUNTER — Other Ambulatory Visit (INDEPENDENT_AMBULATORY_CARE_PROVIDER_SITE_OTHER): Payer: Self-pay | Admitting: "Endocrinology

## 2020-02-19 ENCOUNTER — Other Ambulatory Visit (INDEPENDENT_AMBULATORY_CARE_PROVIDER_SITE_OTHER): Payer: Self-pay | Admitting: "Endocrinology

## 2020-02-21 ENCOUNTER — Other Ambulatory Visit: Payer: Self-pay | Admitting: Neurology

## 2020-02-23 ENCOUNTER — Other Ambulatory Visit: Payer: Self-pay

## 2020-02-23 ENCOUNTER — Encounter: Payer: Self-pay | Admitting: Neurology

## 2020-02-23 ENCOUNTER — Ambulatory Visit: Payer: 59 | Admitting: Neurology

## 2020-02-23 VITALS — BP 120/79 | HR 97 | Temp 97.1°F | Ht 72.0 in | Wt 238.0 lb

## 2020-02-23 DIAGNOSIS — E103593 Type 1 diabetes mellitus with proliferative diabetic retinopathy without macular edema, bilateral: Secondary | ICD-10-CM | POA: Diagnosis not present

## 2020-02-23 DIAGNOSIS — G912 (Idiopathic) normal pressure hydrocephalus: Secondary | ICD-10-CM

## 2020-02-23 MED ORDER — TOPIRAMATE 50 MG PO TABS: 50.0000 mg | ORAL_TABLET | Freq: Two times a day (BID) | ORAL | 0 refills | Status: DC

## 2020-02-23 MED ORDER — ACETAZOLAMIDE ER 500 MG PO CP12: ORAL_CAPSULE | ORAL | 0 refills | Status: DC

## 2020-02-23 NOTE — Progress Notes (Signed)
SLEEP MEDICINE CLINIC   Provider:  Melvyn Novas, MD  Primary Care Physician:  Lucila Maine, Guerry Bruin, MD    Referring Provider: ophthalmologist Dr. Luciana Axe, who is also a retina specialist.   The patient is further followed by endocrinologist Dr. Molli Knock since he is diabetic type I.   Chief Complaint  Patient presents with  . Follow-up    pt alone, rm 10. pt presents today for yearly visit. states things are stable and has no concerns    02-23-2020 Patient was seen in a video conference in 01-2020 and is now back face-to -face.  He reports good controll of diabetes and continued to work through the pandemic.  We need refills for INPH, he is on his last refill. He still has asthmatic issues, but he feels these have been not out of control. He consumes neither chocolate nor caffeine and has not noted tremor.     12-15-2018, I see [name] here today with his mother- he has retro-orbital pain at times, more often in AM- and he has had many more asthma attacks, culminating in chest pain and presentation to ED-I is supposed to have MAB infusions for allergy control but was too sick to have the regular infusion one in 30 days- Has been too sick to get the weekly immunotherapy.  DM follow up documented fair control of glucose.    Just seen by allergy/ asthma physician Dr Lucie Leather on 11-29-2018;  .  I quote: " If it does look like acetazolamide is creating significant problems then he will need to make some tough choices about how to address his pseudotumor cerebri as apparently this condition was threatening his vision.  In retrospect when looking back through his respiratory tract problems they really did increase in intensity and frequency since starting his acetazolamide." We decided to reduce the diamox to daily 250 mg and if asthma is still unabashed , will need to switch to TPM and repeat CSF release.   06-09-2018, I have the pleasure of meeting with Joseph Devoid.  [name]  today, he recently had seen Dr. Alveda Reasons dental office and underwent a home sleep test they are which has the same results essentially that he had here in an attended polysomnography from 18 June 2017.  Prior to our test he had a home sleep test at [facility] and was told based on those results that he did have obstructive sleep apnea which I could not confirm.  Neither could Dr. Alveda Reasons home sleep test.  The patient however is a loud snorer and our concern was that the dental device may help him to reduce or treat the snoring.  The dental referral was not based on the presence of obstructive sleep apnea.  He also qualified that the sleep study from Dr. Alveda Reasons office on 24 May 2018 was performed while he had an upper respiratory infection, could not rest and breathes well at baseline and could especially not rest flat and needed several pillows to support him.  He did not have tachybradycardia arrhythmias, he did not have oxygen desaturations of significance, so we are left with discussing how to treat the snoring. insurance will not pay for snoring based dental device.   [name] is also not excessively daytime sleepy and endorsed the Epworth Sleepiness Scale at 8 points, the  fatigue severity score at 58/ 63  points. He is excessively fatigued and this may relate to his DM type 1. He no longer has symptoms of pseudotumor  on diamox, has to drink more not to dehydrate. He used to see neon green while he has optic nerve pressure- red vision was unimpaired. He no longer has the green ray in his visual field.  Much less tinnitus on higher diamox dose. Here for refills.       Interval history from 25 Mar 2018, [name] is here today for follow-up visit, he has tolerated the LP very well, his opening pressure was elevated he was asked to discontinue the tetracycline medication and instead started on Diamox.  He reports that both headaches and vision have been much improved, sometimes in the  morning he still has a hazy greenish tinge to his visual field the dose away once he takes his medication.  Much less frequent has since been observed in the afternoon or evenings.  He has not to take another antibiotic as his urine sample to look clear and his urologist felt that he was not in need of further therapy. Dr Fransico MichaelBrennan checked his glucometer readings against his pump, and they tracked well. He plans to travel by plane soon and had additional questions about the ICP and diamox. No longer ptosis on the right - resolved right after LP. He has not returned to the gym- where he noted both ptosis and greenish vision.  He was sick after a trip to [place]- explained by altitude exacerbation.  Sleep apnea    Avoiding tetracycline, retinoid, high dose vit A . Avoid high altitudes without diamox premedication.   use diamox defore you fly .     03-12-2018,   I have the pleasure of seeing [name] today in a follow-up visit.  I had seen the patient on 14 May when he presented with very concerning data from his ophthalmologist, retro-orbital pressure was identified, and he was from here referred after physical and neurological exam for an immediate fluoroscopic lumbar puncture.  [facility], a successful nontraumatic spinal tap was obtained, between the second and third lumbar vertebra opening pressure was 28 cmH2O.  Except for an elevated glucose level in the CSF there was no other abnormality, no elevated protein no white blood cells or red cells and all cultures thus far have been negative.  After the spinal tap developed abdominal almost spasms, as well as cramping at the chest wall muscles.  By day 3 post spinal tap he has developed typical spinal tap headaches that arise when he sits up or walks sometimes needing about 10 to 15 minutes.  He feels much better laying flat and laying reclining.  In order to treat the spinal tap  headaches I will send him for a blood patch, urinary tract infection was ruled out his urinary sample to clean I was concerned since I discontinued his antibiotics.  I still think that the tetracyclines probably tipped the intracranial pressure.  He saw Dr. Luciana Axeankin his retinal specialist yesterday, they were pleased with the results as to his retinal changes, any further procedure from ophthalmology will be pending until gait feels better has less headaches and discomfort.  I also started Diamox at 500 mg twice daily should he developed gastrointestinal side effects from this medication I would like in that case to reduce it to 250 in the morning 250 at night and we can still advance the dose later on after a couple of weeks or so.    03-09-2018, Patient returned for an urgent visit  based on vision changes and diabetic changes to the eye, seen by Dr. Luciana Axe.  He developed headaches , changes to green tinged vision, some tremor and HA are worse when he in bed, better when he rises. Dynamic intracranial pressure changes He gets nauseated, and worse vision changes when moving. He feels a vibration and tension in the neck and pressure behind the right eye more than left, he sleeps on his right. History of chronic sinusitis, with tinnitus since 10/ 2017 . He tested negative for OSA. He had initially improved in HA, tinnitus and Vertigo. Tremor could not be objectively seen.  He has been a diabetic Type 1, followed by Dr.  Molli Knock, with an insulin pump.  MRI brain and sinus CT in the last 6 month. Seen by ENT and allergy specialist.  His upper respiratory infects have decreased, since allergy treatment.   Topical vancomycin and extensive notes over, the last visit with the patient was on May 9 Thursday 2019, the patient had been treated with Avastin injections into the left and right eye, beginning in August 2018 the last right eye injection was on November 09, 2017.  Patient is currently on doxycycline,  Arnuity, Humalog insulin, Klonopin, losartan and montelukast.  Impression was that the patient has controlled diabetes type 1 diabetes mellitus with right eye affected by proliferative retinopathy and macular edema Dr. Luciana Axe documented that he felt this was worsening.  He also stated the same for the left eye also to a slightly lesser degree.  There is also a minor cataract forming in both eyes, there is optic papillitis which has not been graded, ocular pain, and a history of acute sinusitis and retinal hemorrhage he mentions the patient snores and has headaches with valsalva-.  Transient obscuration of vision in both eyes- last  MRI scan was normal at the time/ 2018.   Headaches much worse when bending over, body habitus and doxycycline medication may cause this patient's likely intracranial pressure elevation.  ENT cleared him of sinusitis: CLINICAL DATA:  Chronic sinusitis   08-31-2017, [name] is seen here today for a follow-up visit after his sleep study from 06/18/2017.  The patient again had such a mild degree of apnea that it does not need to be  treated with any intervention.  His AHI was only 2.6/h not considered pathologically.  His REM sleep AHI was 14.3/h and supine AHI 3.4.  We meet today to also look at his sleep experience since the study.  The patient reports that he still wakes up with trembling in both arms, he has shoulder discomfort which keeps him from sleeping on his side.  However in intervention with CPAP, PLM medication or oxygen is not necessary at this point.  I do not have a good understanding as to why he trembles, and why these trembling spells wake him from sleep.  He has been followed closely for his thyroid function and reports that it has been in the high normal range.  He still reports some palpitations that he experiences at night.  He also reports facial twitching at night. He related the onset to psychiatric medication ( wellbutrin, trazodone). He has year around  asthma and allergies.  I have no interventional therapies available.    CONSULT : SLEEP CLINIC:  [name] is a 39 y.o. male , seen here as in a referral/ revisit  from sports medicine and Dr Fransico Michael, ped endocrinologist.  I would like to thank Dr. Fransico Michael for his report notes  which helped me today to design further sleep evaluation and treatment for this patient. He also gave me an excellent summary of the past medical problems that [name] has faced. Mr.  [name] was diagnosed with diabetes at age 73 and has been followed by a pediatric endocrinologist, Dr. Fransico Michael. His blood sugars have been better and more stable on an auto mode Medtronic insulin pump. He also faces problems with obesity, hypertension, hyperlipidemia and an autonomic neuropathy with tachycardia and gastroparesis. As blood pressure control had improved so did his heart rate and his postprandial bloating. He has been treated for thyroiditis consistent with an evolving Hashimoto's ultra immune thyroid disease. He was euthyroid again in March 2018 when last checked he had reported fatigue and was diagnosed by a home sleep test last year at " [facility]", was  supposedly diagnosed with obstructive sleep apnea but had not treatment initiated .  He has proliferative retinopathy, attributed to diabetes. Dr. Fransico Michael also wanted to make sure that current rather newish neuromuscular symptoms beI evaluated -he was especially concerned that this patient is prone to further auto-immune diseases such as central demyelination or multiple sclerosis. Dr. Darrick Penna in this sport medicine section of [place] had added in this visit on 04/15/2007 but the patient habits and improvement of nausea, vertigo, after discontinuing eyedrops, trazodone and Wellbutrin and atorvastatin. He had more headaches. He felt jittery and tremors of his right hand and right leg were temporary worse. He describes photophobia. He felt shaking more in the morning l right  hand and right leg, an inner jitteriness. It has improved. Vertigo not present today.    Sleep habits are as follows:  " Bedtime between 10 and 12 midnight, rises at 7 AM. He describes his sleep is rather restless, trying to find a comfortable sleep position, but she doesn't necessarily use the bed during the night. He does not report nocturia, and only twice or so in the last month has he woke up with palpitations or clamminess, likely related to hypoglycemia. He is known to snore, and he had a home sleep test at [facility] last year in [facility] and was told that he has obstructive sleep apnea. Treatment was not initiated yet. He wakes up not feeling restored or refreshed, and he has noticed that his jittery illness affecting the right extremities and a feeling of inner trembling of his torso is the worst in the morning hours. It doesn't last that long. He also has a pulsating change of vision when he first gets up and he suffered from vertigo for much of the last 3 months. No daytime naps, he wouldn't sleep at night if he did.   Sleep medical history and family sleep history:  Dad snoring, mom is an insomniac. He remembers having problems sleeping even in childhood. He has felt fatigued and not restored for several months. Social history: works as Games developer. The patient is single, no children. No tobacco use, seldom alcohol use,  4 months ago he stopped drinking caffeine due to the jitteriness, and headaches with photophobia and a feeling of the head being full could also be relating to sinusitis.    Review of Systems: Out of a complete 14 system review, the patient complains of only the following symptoms, and all other reviewed systems are negative.       Social History   Socioeconomic History  . Marital status: Single    Spouse name: Not on file  . Number of children: Not  on file  . Years of education: Not on file  . Highest education level: Not on file    Occupational History  . Not on file  Tobacco Use  . Smoking status: Never Smoker  . Smokeless tobacco: Never Used  Substance and Sexual Activity  . Alcohol use: No  . Drug use: No  . Sexual activity: Not on file  Other Topics Concern  . Not on file  Social History Narrative   ** Merged History Encounter **       Social Determinants of Health   Financial Resource Strain:   . Difficulty of Paying Living Expenses:   Food Insecurity:   . Worried About Charity fundraiser in the Last Year:   . Arboriculturist in the Last Year:   Transportation Needs:   . Film/video editor (Medical):   [name] Lack of Transportation (Non-Medical):   Physical Activity:   . Days of Exercise per Week:   . Minutes of Exercise per Session:   Stress:   . Feeling of Stress :   Social Connections:   . Frequency of Communication with Friends and Family:   . Frequency of Social Gatherings with Friends and Family:   . Attends Religious Services:   . Active Member of Clubs or Organizations:   . Attends Archivist Meetings:   [name] Marital Status:   Intimate Partner Violence:   . Fear of Current or Ex-Partner:   . Emotionally Abused:   [name] Physically Abused:   . Sexually Abused:     Family History  Problem Relation Age of Onset  . Dementia Paternal Aunt   . Schizophrenia Paternal Aunt   . Cancer Maternal Grandmother   . Diabetes Maternal Grandmother        T2 DM  . Cancer Maternal Grandfather   . Heart disease Father   . Thyroid disease Neg Hx     Past Medical History:  Diagnosis Date  . ADHD (attention deficit hyperactivity disorder)   . Asthma   . Autonomic neuropathy due to diabetes ([place])   . Diabetes ([place])   . Fatigue   . Goiter   . High blood pressure   . High cholesterol   . Hypercholesterolemia   . Hypertension   . Hypoglycemia associated with diabetes ([place])   . Obesity   . Tachycardia   . Type 1 diabetes mellitus not at goal [facility])   . Uncontrolled DM with  microalbuminuria or microproteinuria     Past Surgical History:  Procedure Laterality Date  . REFRACTIVE SURGERY     x9  . WISDOM TOOTH EXTRACTION      Current Outpatient Medications  Medication Sig Dispense Refill  . acetaminophen (TYLENOL) 500 MG tablet Take 1,000 mg by mouth 2 (two) times daily as needed.    [name] acetaZOLAMIDE (DIAMOX) 500 MG capsule TAKE 2 CAPSULES BY MOUTH DAILY IN THE MORNING AND 1 CAPSULE IN EVENING 90 capsule 0  . albuterol (PROAIR HFA) 108 (90 Base) MCG/ACT inhaler Inhale 2 puffs into the lungs every 6 (six) hours as needed for wheezing or shortness of breath. 1 Inhaler 2  . albuterol (PROAIR HFA) 108 (90 Base) MCG/ACT inhaler Inhale 2 puffs into the lungs every 4 (four) hours as needed for wheezing or shortness of breath. 18 g 1  . albuterol (PROVENTIL) (2.5 MG/3ML) 0.083% nebulizer solution Take 3 mLs (2.5 mg total) by nebulization every 4 (four) hours as needed for wheezing or shortness of breath. 75  mL 1  . ALPRAZolam (XANAX) 0.25 MG tablet Take one tablet at bedtime as needed. 30 tablet 3  . ASMANEX HFA 200 MCG/ACT AERO INHALE 2 PUFFS INTO THE LUNGS TWICE DAILY 13 g 5  . atorvastatin (LIPITOR) 20 MG tablet Take 20 mg by mouth daily.   0  . AUVI-Q 0.3 MG/0.3ML SOAJ injection Use as directed for life-threatening allergic reaction. 2 each 2  . budesonide (PULMICORT) 0.5 MG/2ML nebulizer solution Take 2 mLs (0.5 mg total) by nebulization as directed. 2 mL 3  . budesonide-formoterol (SYMBICORT) 160-4.5 MCG/ACT inhaler Inhale 2 puffs into the lungs 2 (two) times daily. 1 Inhaler 5  . EPINEPHrine (AUVI-Q) 0.3 mg/0.3 mL IJ SOAJ injection Use as directed for severe allergic reactions 2 each 1  . glucagon 1 MG injection Follow package directions for low blood sugar. 1 each 3  . HUMALOG 100 UNIT/ML injection INJECT 300 UNITS UNDER THE SKIN VIA INSULIN PUMP EVERY 48 TO 72 HOURS PER HYPERGLYCEMIA AND DKA PROTOCOLS 120 mL 5  . levocetirizine (XYZAL) 5 MG tablet Take 5 mg by  mouth daily as needed for allergies.     [name] losartan (COZAAR) 50 MG tablet TAKE 1 TABLET(50 MG) BY MOUTH DAILY 90 tablet 1  . montelukast (SINGULAIR) 10 MG tablet Take 1 tablet (10 mg total) by mouth at bedtime. 30 tablet 5  . mupirocin ointment (BACTROBAN) 2 % mupirocin 2 % topical ointment  APP EXT IEN BID    . NUCALA 100 MG/ML SOAJ Inject 100 mg as directed every 28 (twenty-eight) days. Nucala autoinjector at home once a month.    . nystatin (MYCOSTATIN) 100000 UNIT/ML suspension Swish and swallow 5 mls daily (Patient taking differently: Use as directed 5 mLs in the mouth or throat daily as needed (for thrush). Swish and swallow 5 mls daily) 150 mL 2  . omeprazole (PRILOSEC) 20 MG capsule Take 20 mg by mouth daily as needed (for reflux).     [name] ULTRA test strip USE TO CHECK BLOOD SUGAR SIX TIMES DAILY AS DIRECTED 200 strip 0  . topiramate (TOPAMAX) 50 MG tablet Take 1 tablet (50 mg total) by mouth 2 (two) times daily. 60 tablet 0   Current Facility-Administered Medications  Medication Dose Route Frequency Provider Last Rate Last Admin  . Mepolizumab SOLR 100 mg  100 mg Subcutaneous Q28 days Alfonse Spruce, MD   100 mg at 02/16/19 1018   Facility-Administered Medications Ordered in Other Visits  Medication Dose Route Frequency Provider Last Rate Last Admin  . gadopentetate dimeglumine (MAGNEVIST) injection 20 mL  20 mL Intravenous Once PRN Kaegan Hettich, Porfirio Mylar, MD        Allergies as of 02/23/2020 - Review Complete 02/23/2020  Allergen Reaction Noted  . Flonase [fluticasone] Shortness Of Breath 06/10/2017  . Molds & smuts Other (See Comments) 03/09/2018  . Sulfites Itching and Swelling 02/04/2018  . Tetracyclines & related Other (See Comments) 03/09/2018    Vitals: BP 120/79   Pulse 97   Temp (!) 97.1 F (36.2 C)   Ht 6' (1.829 m)   Wt 238 lb (108 kg)   BMI 32.28 kg/m  Last Weight:  Wt Readings from Last 1 Encounters:  02/23/20 238 lb (108 kg)   UEA:VWUJ mass index  is 32.28 kg/m.     Last Height:   Ht Readings from Last 1 Encounters:  02/23/20 6' (1.829 m)    Physical exam:  General: The patient is awake, alert and appears not in acute distress.  The patient is well groomed. Head: Normocephalic, atraumatic. Neck is supple. Mallampati 5-  ,  neck circumference:21. Nasal airflow patent , TMJ is  Not  evident . Retrognathia is not seen. Full facial hair.  Cardiovascular:  Regular rate and rhythm , without  murmurs or carotid bruit, and without distended neck veins. Respiratory:  Voice is hoarse, used inhaler right before visit. He has wheezing and had rhonchi., has been on steroids, antibiotics.  Skin:  Without evidence of edema, or rash Trunk: BMI is elevated. The patient's posture is erect   Neurologic exam : The patient is awake and alert, oriented to place and time.   Memory subjective described as intact.  Attention span & concentration ability appears normal.  Speech is fluent, without dysarthria, dysphonia or aphasia.  Mood and affect are appropriate.  Cranial nerves: Pupils are equal and briskly reactive to light. Pale retinae bilaterally, yellow tinged - has neovascular proliferation in both eyes. Laser surgical scars visible.  Extraocular movements in vertical and horizontal planes  without nystagmus.  Hearing to finger rub intact. Facial sensation intact to fine touch. Facial motor  symmetric and tongue / uvula move midline. Shoulder shrug was symmetrical.  Motor exam:  Normal tone, muscle bulk and symmetric strength in all extremities. He has less tension over the neck, worked with PT.  Sensory:  Fine touch, pinprick normal in feet , but he has less often a feeling of vibration in feet and hands. Coordination: Rapid alternating movements normal without evidence of ataxia, dysmetria or tremor. He is restless NO TREMOR seen here today . Gait and station: Patient walks without assistive device . Strength within normal limits.  Stance is  stable and normal.  Turns with 3 steps.   Deep tendon reflexes: in the upper and lower extremities are symmetric and intact. Babinski maneuver response is  downgoing.  Assessment:  After physical and neurologic examination, review of laboratory studies,  Personal review of imaging studies, reports of other /same  Imaging studies, results of polysomnography and / or neurophysiology testing and pre-existing records as far as provided in visit., my assessment is   1)  intracranial hypertension in a young man with brittle diabetes as confirmed by LP. Now on diamox having more frequent asthma attacks- causality unclear , but time correlated.   Reduction in dose helped to keep asthma manageable and allowed preservation of pressure control. .    2) his sleep evaluation has been completed, no apnea, mild PLMs and no hypoxemia. Snoring was mild- moderate. No need for CPAP, he may pursue a dental device.    1)Refilled diamox at  reduced dose , RV with Np in 12 month.    Melvyn Novas, MD 02/23/2020, 9:36 AM  Certified in Neurology by ABPN Certified in Sleep Medicine by [facility] [address] [place], [place] [address]

## 2020-02-23 NOTE — Patient Instructions (Signed)
Acetazolamide Oral Tablets What is this medicine? ACETAZOLAMIDE (a set a ZOLE a mide) is a diuretic. It helps you make more urine and to lose salt and excess water from your body. It treats swelling from heart disease. It helps treat some seizures and some kinds of glaucoma. It also treats and prevents symptoms of altitude sickness (acute mountain sickness). This medicine may be used for other purposes; ask your health care provider or pharmacist if you have questions. COMMON BRAND NAME(S): Diamox What should I tell my health care provider before I take this medicine? They need to know if you have any of these conditions:  glaucoma  kidney disease  liver disease  low adrenal gland function  lung or breathing disease (COPD, chronic bronchitis, emphysema)  an unusual or allergic reaction to acetazolamide, sulfa drugs, other drugs, foods, dyes or preservatives  pregnant or trying to get pregnant  breast-feeding How should I use this medicine? Take this medicine by mouth with a glass of water. Follow the directions on the prescription label. Take this medicine with food if it upsets your stomach. Take your doses at regular intervals. Do not take your medicine more often than directed. Do not stop taking except on your doctor's advice. Talk to your pediatrician regarding the use of this medicine in children. Special care may be needed. Patients over 65 years old may have a stronger reaction and need a smaller dose. Overdosage: If you think you have taken too much of this medicine contact a poison control center or emergency room at once. NOTE: This medicine is only for you. Do not share this medicine with others. What if I miss a dose? If you miss a dose, take it as soon as you can. If it is almost time for your next dose, take only that dose. Do not take double or extra doses. What may interact with this medicine? Do not take this medicine with any of the following  medications:  methazolamide This medicine may also interact with the following medications:  aspirin and aspirin-like medicines  cyclosporine  lithium  medicine for diabetes  methenamine  other diuretics  phenytoin  primidone  quinidine  sodium bicarbonate  stimulant medicines like dextroamphetamine This list may not describe all possible interactions. Give your health care provider a list of all the medicines, herbs, non-prescription drugs, or dietary supplements you use. Also tell them if you smoke, drink alcohol, or use illegal drugs. Some items may interact with your medicine. What should I watch for while using this medicine? Visit your doctor or health care professional for regular checks on your progress. You will need blood work done regularly. If you are diabetic, check your blood sugar as directed. You may need to be on a special diet while taking this medicine. Ask your doctor. Also, ask how many glasses of fluid you need to drink a day. You must not get dehydrated. You may get drowsy or dizzy. Do not drive, use machinery, or do anything that needs mental alertness until you know how this medicine affects you. Do not stand or sit up quickly, especially if you are an older patient. This reduces the risk of dizzy or fainting spells. This medicine can make you more sensitive to the sun. Keep out of the sun. If you cannot avoid being in the sun, wear protective clothing and use sunscreen. Do not use sun lamps or tanning beds/booths. What side effects may I notice from receiving this medicine? Side effects that you should report   to your doctor or health care professional as soon as possible:  allergic reactions like skin rash, itching or hives, swelling of the face, lips, or tongue  breathing problems  confusion, depression  dark urine  fever  numbness, tingling in hands or feet  redness, blistering, peeling or loosening of the skin, including inside the  mouth  ringing in the ears  seizures  unusually weak or tired  yellowing of the eyes or skin Side effects that usually do not require medical attention (report to your doctor or health care professional if they continue or are bothersome):  change in taste  diarrhea  headache  loss of appetite  nausea, vomiting  passing urine more often This list may not describe all possible side effects. Call your doctor for medical advice about side effects. You may report side effects to FDA at 1-800-FDA-1088. Where should I keep my medicine? Keep out of the reach of children. Store at room temperature between 20 and 25 degrees C (68 and 77 degrees F). Throw away any unused medicine after the expiration date. NOTE: This sheet is a summary. It may not cover all possible information. If you have questions about this medicine, talk to your doctor, pharmacist, or health care provider.  2020 [employer]vier/Gold Standard (2019-08-09 12:32:38)  

## 2020-03-09 ENCOUNTER — Other Ambulatory Visit (INDEPENDENT_AMBULATORY_CARE_PROVIDER_SITE_OTHER): Payer: Self-pay | Admitting: "Endocrinology

## 2020-03-22 ENCOUNTER — Other Ambulatory Visit: Payer: Self-pay | Admitting: Allergy & Immunology

## 2020-04-17 ENCOUNTER — Other Ambulatory Visit: Payer: Self-pay

## 2020-04-17 ENCOUNTER — Ambulatory Visit (INDEPENDENT_AMBULATORY_CARE_PROVIDER_SITE_OTHER): Payer: 59 | Admitting: Sports Medicine

## 2020-04-17 VITALS — BP 108/78 | Ht 72.0 in | Wt 235.0 lb

## 2020-04-17 DIAGNOSIS — M25512 Pain in left shoulder: Secondary | ICD-10-CM

## 2020-04-17 DIAGNOSIS — M25519 Pain in unspecified shoulder: Secondary | ICD-10-CM | POA: Insufficient documentation

## 2020-04-17 DIAGNOSIS — M25511 Pain in right shoulder: Secondary | ICD-10-CM

## 2020-04-17 DIAGNOSIS — M791 Myalgia, unspecified site: Secondary | ICD-10-CM | POA: Diagnosis not present

## 2020-04-17 DIAGNOSIS — R200 Anesthesia of skin: Secondary | ICD-10-CM | POA: Diagnosis not present

## 2020-04-17 MED ORDER — AMITRIPTYLINE HCL 50 MG PO TABS: 50.0000 mg | ORAL_TABLET | Freq: Every day | ORAL | 0 refills | Status: DC

## 2020-04-17 NOTE — Patient Instructions (Signed)
1. We have gotten labs today 2. Please take amitriptyline daily at bedtime 3.  Follow-up in 2 to 3 weeks with Dr. Darrick Penna.

## 2020-04-17 NOTE — Assessment & Plan Note (Addendum)
Patient with acute bilateral shoulder pain that has been present x1 week.  Unclear etiology.  Has full range of motion and normal special tests.  No increased exercise or lifting recently.  Unlikely impingement or tear as he has full range of motion and good strength.    I'm concerned with the bilateral nature and the general muscle aches as to whether this relates to a more systematic process. Can consider diabetic myositis versus myopathy.  Will get a ESR and CK to rule this out.  Can also consider statin myopathy.  Lab work will suggest this as well.  We will also get a CBC as he has not had a recent CBC.   Can consider a component of his lack of sleep or depression.  Will obtain lab work as described above and also start amitriptyline 50 mg nightly.  Has a history of sleep disorder.  That could be a trigger for his muscle spasm and aches.  Amitriptyline might help this as well.  Advised to follow-up in 2 to 3 weeks with Dr. Oneida Alar.[name]

## 2020-04-17 NOTE — Progress Notes (Signed)
[name] is a 39 y.o. male who presents to [facility] today for the following:  Patient presenting today with multiple MSK complaints.  States that for the past week he has been feeling sore in many muscles.  States that his left and right shoulder and arm are sore and he notes pain and numbness in his fourth and fifth digits.  Numbness is worse when he sleeps on his hands.  Also reports pec pain.  States that he has pain with any movement and also a sharp pain in the center of his back.  States that his hands can go numb especially at night in his fourth and fifth digits.  States that his biceps also feel tight when he uses them.  States some neck pain as well.  Denies any trauma or excessive exercise or use.  States that it is hard to hold objects due to pain but he notes no weakness.  Reports he is taking ibuprofen and Advil which helps.  Has not tried ice or heat.  States that he recently saw his PCP last week and his A1c was 6.3.  Has been walking previously but stopped this secondary to pain a week ago.   Of note patient does report poor sleep.  Only sleeps about 5 to 6 hours a night.  Usually sleep is not hindered by pain but recently sharp shoulder pains waken him at night.   ROS Denies any fevers. No tick exposure Is right-handed but more pain on left although now bilateral Has been on lipitor > 2 years and is unsure whether any muscular complaints started with this  Depressive sxs with this pain and poor sleep pattern (confirmed by his mother)   PMH reviewed. T1DM, pseudotumor cerebri, obesity ROS as above. Medications reviewed.  Exam:  BP 108/78   Ht 6' (1.829 m)   Wt 235 lb (106.6 kg)   BMI 31.87 kg/m  Gen: Well NAD MSK: Shoulder: Inspection reveals no obvious deformity, atrophy, or asymmetry. No bruising. No swelling Palpation is normal with no TTP over [facility] joint or bicipital groove. Full ROM in flexion, abduction, internal/external rotation NV intact  distally Special Tests:  - Impingement: Neg Hawkins, neers, empty can sign. - Supraspinatous: Negative empty can.  5/5 strength with resisted flexion at 20 degrees - Infraspinatous/Teres Minor: 5/5 strength with ER - Subscapularis: 5/5 strength with IR - Biceps tendon: Negative Speeds, Yerrgason's  - Labrum: good stability - AC Joint: Negative cross arm - Negative apprehension test - No painful arc and no drop arm sign Neck/Back: - Inspection: no gross deformity or asymmetry, swelling or ecchymosis - Palpation: no TTP of spinous process  - ROM: full active ROM of the cervical spine with neck extension, rotation, flexion - pain in all directions - Strength: 5/5 wrist flexion, extension, biceps flexion. 4/5 triceps extension. OK sign, interosseus strength intact  - Neuro: sensation intact in the C5-C8 nerve root distribution b/l  - Special testing: positive slump test, positive spurling's Hand: Inspection: No obvious deformity. No swelling, erythema or bruising Palpation: no TTP ROM: Full ROM of the digits and wrist. Fully able to extend and flex finger. Strength: 5/5 strength in the forearm, wrist and interosseus muscles Neurovascular: NV intact Special tests: negative tinel's at the carpal tunnel, negative Phalen's and reverse Phalen's Fingers:  No swelling in PIP, DIP joints. Flexor digitorum profundus and superficialis tendon functions are intact.  PIP joint collateral ligaments are stable     Assessment and Plan: 1) Shoulder  pain Patient with acute bilateral shoulder pain that has been present x1 week.  Unclear etiology.  Has full range of motion and normal special tests.  No increased exercise or lifting recently.  Unlikely impingement or tear as he has full range of motion and good strength.  Can consider diabetic myositis versus myopathy.  Will get a ESR and CK to rule this out.  Can also consider statin myopathy.  Lab work will show this as well.  We will also get a CBC as he  has not had a recent CBC.  Can consider a component of his lack of sleep or depression.  Will obtain lab work as described above and also start amitriptyline 50 mg nightly.  Advised to follow-up in 2 to 3 weeks with Dr. Oneida Alar..  Finger numbness Negative Tinel, Phalen's, reverse Phalen's.  Unable to reproduce numbness urine clinic.  Can consider some impingement of ulnar nerve.  Hopefully once shoulder pain improves the numbness also will improve.  Conservative measures discussed.  Follow-up with Dr. Eden Lathe in 2 to 3 weeks.   [name], PGY-3 [facility] Resident 04/17/2020 10:46 AM   I observed and examined the patient with the resident and agree with assessment and plan.  Note reviewed and modified by me. Joseph Mcgill, MD

## 2020-04-17 NOTE — Assessment & Plan Note (Addendum)
Negative Tinel, Phalen's, reverse Phalen's.  Unable to reproduce numbness urine clinic.  Can consider some impingement of ulnar nerve.  Hopefully once shoulder pain improves the numbness also will improve.  Conservative measures discussed.  Follow-up with Dr. Jettie Booze in 2 to 3 weeks.

## 2020-04-18 LAB — CK: Total CK: 82 U/L (ref 49–439)

## 2020-04-18 LAB — SEDIMENTATION RATE: Sed Rate: 6 mm/hr (ref 0–15)

## 2020-04-21 ENCOUNTER — Other Ambulatory Visit: Payer: Self-pay | Admitting: Neurology

## 2020-04-24 ENCOUNTER — Telehealth: Payer: Self-pay | Admitting: *Deleted

## 2020-04-24 NOTE — Telephone Encounter (Signed)
Patient stated the 50mg  dose of amitriptyline is making him groggy during the day; medication is working well at night.   Per Dr , patient can decrease dose of amitriptyline to 25mg  and to take this new dose earlier in the night. Patient agrees with this new dose.   Also informed patient that his labs were within normal range. Patient voiced understanding.

## 2020-04-26 ENCOUNTER — Ambulatory Visit: Payer: 59 | Admitting: Sports Medicine

## 2020-04-26 ENCOUNTER — Other Ambulatory Visit: Payer: Self-pay

## 2020-04-26 DIAGNOSIS — M25511 Pain in right shoulder: Secondary | ICD-10-CM | POA: Diagnosis not present

## 2020-04-26 DIAGNOSIS — M25512 Pain in left shoulder: Secondary | ICD-10-CM | POA: Diagnosis not present

## 2020-04-26 DIAGNOSIS — R200 Anesthesia of skin: Secondary | ICD-10-CM

## 2020-04-26 HISTORY — PX: EYE SURGERY: SHX253

## 2020-04-26 MED ORDER — METHOCARBAMOL 500 MG PO TABS: 500.0000 mg | ORAL_TABLET | Freq: Two times a day (BID) | ORAL | 1 refills | Status: DC

## 2020-04-26 NOTE — Progress Notes (Signed)
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 from poor neck posture and poor sleep pattern Ultimately he responded to flexeril and posture work  Past Hx; feels DM is stable at this time Psuedotumor sxs as noted in past Hx have resolved  ROS Still gets numbness in fingers 4 and 5 bilaterally after sleeping No weakness in hands or arms Sleep pattern - using xanax will get about 5 hours - wanted to stop this but unable to sleep States that he feels anxiety is less as he is not working or under much pressure  PE Depressed appearing WM in NAD BP 114/80   Ht 6' (1.829 m)   Wt 235 lb (106.6 kg)   BMI 31.87 kg/m   Neck with full ROM C/o tightness with neck extension C5 to T1 testing is normal Upper extremity reflexes are diminished but this is bilateral

## 2020-04-26 NOTE — Patient Instructions (Addendum)
Start taking the robaxin twice daily as needed.  You can increase this up to 4 times daily if you need to.  You were shown stretches and a neck exercise today. Start doing these daily.  Follow up in 4 weeks.

## 2020-04-26 NOTE — Assessment & Plan Note (Signed)
Shoulder evaluation is unremarkable  Possible referred from neck Posture is poor Sleep pattern is poor Neck MM tightness  Will try different MM relaxant with Robaxin BID and up to QID  Needs better sleep pattern  While patient denies this I am still concerned depression or anxiety could be triggers  Reck 4 to 6 weeks after starting robaxin

## 2020-04-26 NOTE — Assessment & Plan Note (Signed)
This is ulnar nerve distribution I wonder about positional neurapraxia Don;t think this is DM or degenerative CX disc  Will reck neck XR if not improving  Given isometrics for neck and postural stretches

## 2020-05-01 ENCOUNTER — Ambulatory Visit: Payer: 59 | Admitting: Sports Medicine

## 2020-05-14 ENCOUNTER — Other Ambulatory Visit: Payer: Self-pay

## 2020-05-14 ENCOUNTER — Encounter (INDEPENDENT_AMBULATORY_CARE_PROVIDER_SITE_OTHER): Payer: Self-pay | Admitting: "Endocrinology

## 2020-05-14 ENCOUNTER — Ambulatory Visit (INDEPENDENT_AMBULATORY_CARE_PROVIDER_SITE_OTHER): Payer: 59 | Admitting: "Endocrinology

## 2020-05-14 VITALS — BP 132/84 | Wt 234.0 lb

## 2020-05-14 DIAGNOSIS — M542 Cervicalgia: Secondary | ICD-10-CM

## 2020-05-14 DIAGNOSIS — E049 Nontoxic goiter, unspecified: Secondary | ICD-10-CM

## 2020-05-14 DIAGNOSIS — E1043 Type 1 diabetes mellitus with diabetic autonomic (poly)neuropathy: Secondary | ICD-10-CM

## 2020-05-14 DIAGNOSIS — E1065 Type 1 diabetes mellitus with hyperglycemia: Secondary | ICD-10-CM | POA: Diagnosis not present

## 2020-05-14 DIAGNOSIS — E1029 Type 1 diabetes mellitus with other diabetic kidney complication: Secondary | ICD-10-CM | POA: Diagnosis not present

## 2020-05-14 DIAGNOSIS — R809 Proteinuria, unspecified: Secondary | ICD-10-CM

## 2020-05-14 DIAGNOSIS — I10 Essential (primary) hypertension: Secondary | ICD-10-CM

## 2020-05-14 DIAGNOSIS — E063 Autoimmune thyroiditis: Secondary | ICD-10-CM

## 2020-05-14 DIAGNOSIS — E10649 Type 1 diabetes mellitus with hypoglycemia without coma: Secondary | ICD-10-CM | POA: Diagnosis not present

## 2020-05-14 DIAGNOSIS — E782 Mixed hyperlipidemia: Secondary | ICD-10-CM

## 2020-05-14 LAB — POCT GLYCOSYLATED HEMOGLOBIN (HGB A1C): Hemoglobin A1C: 6.5 % — AB (ref 4.0–5.6)

## 2020-05-14 LAB — POCT GLUCOSE (DEVICE FOR HOME USE): Glucose Fasting, POC: 176 mg/dL — AB (ref 70–99)

## 2020-05-14 NOTE — Patient Instructions (Signed)
Follow up visit in 3 months. 

## 2020-05-14 NOTE — Progress Notes (Signed)
Subjective:  Patient Name: [name] Date of Birth: 12-02-1980  MRN: [id number]  [name]  presents at his clinic visit today for follow-up of his type 1 diabetes mellitus, goiter, obesity, combined hyperlipidemia, adult ADHD, fatigue, autonomic neuropathy, tachycardia, hypertension, microalbuminuria, hypoglycemia, sinusitis, bronchitis, allergies, proliferative retinopathy, and pseudotumor cerebri/increased intracranial hypertension.   HISTORY OF PRESENT ILLNESS:   [name] is a 39 y.o. Caucasian gentleman. [name] was unaccompanied.  1. The patient was first referred to me on 01/08/2006 by his family physician, Dr. Andreas Blower, for evaluation and management of type 1 diabetes mellitus and related problems. The patient was 39 years old.  A. [name] had been diagnosed with type 1 diabetes somewhere in 2001-2002, at the age of 22-19. He was initially diagnosed with type 2 diabetes mellitus, but was later re-classified as type 1 diabetes mellitus. He had been followed by Dr. Romilda Garret, staff endocrinologist at [facility]. [name] was started on an insulin pump approximately 18 months prior to first seeing me. His pump was a Medtronic Paradigm 712. His blood glucose control was fair-to-poor. He was frequently not checking blood sugars or taking insulin boluses as often as he needed to. He frequently noted fast heart rate. The patient's past medical history was positive for hypertension, for which he was taking lisinopril, and for what his mother called "extreme ADHD". He had previously been taking ADHD medicines but had discontinued them due to adverse effects. He had prior ankle injuries and knee injuries, to include tears of the lateral menisci bilaterally. He had not had any surgeries. 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 As a result, the patient was on the road a lot and spent much of his time outdoors at  field sites. He did not use tobacco or drugs, but did drink alcohol occasionally.  B. On physical examination, his weight was 234 pounds, his height was 70 inches, his BMI was 33.2. Blood pressure was 128/70. Hemoglobin A1c was 9.5%. Heart rate was 96. He had normal affect and fair insight. He had a 25-30 gram goiter. He had normal 1+ DP pulses in his feet. He had normal sensation in his feet to touch, vibration, and monofilament. His CMP was normal except for glucose of 251. His cholesterol was 257, triglycerides 143, HDL 49, and LDL 179. TSH was 1.017, free T4 was 1.06, and free T3 was 3.3. His urinary microalbumin: creatinine ratio was 20.1 (normal less than 30).   C. The patient clearly needed better blood glucose control, which would only occur if he had better adherence to his plan. He appeared to have autonomic neuropathy, manifested by inappropriate sinus tachycardia.  His thyroid goiter suggested that he might have evolving Hashimoto's disease. He stated that he had lost some weight recently. I encouraged him to check his blood sugars more frequently and to take both correction boluses and food boluses.  2. During the past fourteen years, [name] has had to deal with many different problems:  A. T1DM/hypoglycemia:    1). 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 His hemoglobin A1c values had varied from 8.3-11.2%.   2). After starting him on a Medtronic 670G insulin pump and Guardian 3 sensor on 01/12/17, his BG control has improved significantly, despite the illnesses described below.  B. DM complications: .   1). He has bilateral diabetic retinopathy and is followed by his retinologist, Dr. Terance Hart, MD.   2). He has autonomic neuropathy manifested by inappropriate sinus tachycardia and gastroparesis causing postprandial bloating and GERD.  These problems have varied inversely with his degree of BG control.   C. Combined hyperlipidemia:     1). On 12/03/10 his total cholesterol was 270, triglycerides 94, HDL 50, and LDL 201. I started him on Crestor, 10 mg per day. He has since begun treatment with atorvastatin and his cholesterol values had improved.    2). On 12/29/16 his lipids had improved, but his cholesterol values were still elevated. After developing the illnesses noted below, his atorvastatin was discontinued due to concerns that he might be having adverse reactions to the atorvastatin.    D. Allergies, sinusitis, bronchitis, headaches, muscle pains/spasms:   1). Unfortunately, beginning in about June 2018, [name] began to develop recurrent episodes of allergic rhinitis, head fullness, sinusitis, and bronchitis. He also developed headaches, muscle pains and spasms, lethargy, fatigue, and weakness that were presumably due in large part to severe allergies. His atorvastatin and several other medications were stopped at that time. Despite receiving excellent care from his allergist, to include allergy injections and biologic injections, [name]'s allergic issues persisted.    2). In the past 4-6 months these illnesses/problems have largely resolve, in part due to taking a monthly biologic allergy injection.   E. Increased intracranial pressure:    1). [name] was seen in consultation by Dr. Larey Seat, neurology, on 05/28/17 for headaches, tremors of his right hand and foot, shaky vision, and symptoms c/w OSA. His initial sleep study did not show any significant amount of OSA.    2). On 03/08/18, Dr.Dohmeier was notified that [facility] eye doctor had seen signs of fluid build up in his right eye. An LP was performed in radiology on 03/09/18. Opening pressure was 28 cm of water, c/w increased intracranial pressure and pseudotumor cerebri. This problem was initially though to be due to tetracycline usage, so TCN was discontinued and Diamox begun. The  Diamox doses have been changed over time. Many of his neuromuscular symptoms have improved since then.   G. Adult ADD/depression: [name] was evaluated by Dr Carlena Hurl, a noted neuropsychiatrist in [place] in 2018. At his last follow up visit in November 2018, Dr. Wylene Simmer decided to wait until [name]'s overall health situation  stabilized before trying any new medications.   H. General physical and emotional health: [name] has been very sick for much of the past 2 years. He has been unable to work full-time and has become almost confined to his home. He has become increasingly anhedonic over time.    3. [name]'s last [facility] visit was on 09/12/2019. At that visit we continued his insulin pump settings. [name] was supposed to return to see me in 2 months, but did not.   A. In the interim he has not been well. Several months ago he began to develop pains in his left upper chest and shoulder area. He saw Dr. Oneida Alar. The pains then went into his right upper chest and shoulder, then into his shoulder blades. Now he can't bend his neck. [place] of his left shoulder was negative.   C. He has talked with his therapist, Ms[facility], every week. He is using alprazolam, 0.25 mg at bedtime when needed for anxiety. His PCP will prescribe the medication for him.  He thinks the medication and  the therapy are helping him.   D. On 01/03/19 he had an LP that showed elevated pressure. His diamox dose was increased somewhat. Dr. Brett Fairy also started him on Topamax. He had a follow up visit with Dr. Brett Fairy on 02/23/20. She continued him on low doses of both medications. He still has some adverse effects of the Diamox, but not as bad. He still has some mild fatigue.  E. He was treated with nitrofurantoin for acute cystitis on 01/28/19.He has been asymptomatic since then.   F. He saw Dr. Zadie Rhine in October 2020 and again in February 2021. There were no new signs of diabetic eye disease.   G. He saw Dr. Carmelina Peal on 03/15/19 and again  in February 2021. [name]'s allergies and asthma have been okay since has been wearing his covid mask. He has not needed steroids recently.   H. His nausea still occurs, but probably less. He is not sure if he still takes omeprazole.   I. He has not had any allergy injections for 7-8 months, but has continued to take his monthly biologic injection.    J. He continues to have problems with lactose intolerance. When he avoids lactose, however, he is asymptomatic. Some foods such as shellfish cause his throat to swell up.   K. He uses Humalog aspart insulin in his insulin pump. He takes losartan, 50 mg/day. He rarely has any coughing unless his allergies act up..    L. His second sleep study was not bad enough to start on C-pap.    M. His BGs have varied with his illnesses, with steroid usage, and with the amount of postprandial bloating and constipation that he has. Fortunately, the bloating and constipation have improved in parallel with improvements in his BG control. He no longer has to delay his boluses very often, because he is not as worried that his gastric emptying will be too delayed and cause hypoglycemia.     4. Pertinent Review of Systems: Constitutional: [name] feels "tired and having quite a bit of pain". "I can't really use my arms or my neck." The pain makes it difficult for him to sleep.  "My energy is decreased." Headaches are nuchal now and are "not as bad". The visual color changes, muscle spasms, and tingling that were attributed to Diamox now bother him only "occasionally" since reducing the Diamox dose.  Eyes: As above. His vision is usually good with his glasses. His eyes tire easily. He had an eye exam in October 2020 and again in February 2021. There were no new signs of DM damage.  Neck: He has occasionally had intermittent anterior neck swelling and tenderness.  Lungs: Breathing has been "alright", but he still sometimes has SOB with exertion.  Heart: He thinks his heart beats  faster.  Gastrointestinal: As above. He does not have much postprandial bloating or reflux. The patient has no other complaints of excessive hunger, heartburn, upset stomach, stomach aches or pains, or diarrhea while taking omeprazole, 20 mg, daily, but he can't remember if he still takes it. .  Legs: As above. He sometimes has spasms of the larger muscles of his legs, but at other times they feel like they are vibrating.  These sensations may last for up to a few hours. Muscle mass and strength seem normal. There are no other complaints of numbness, tingling, burning, or pain. No edema is noted. Feet: There are no obvious foot problems now. He still has episodic foot pains, but not often. There are no  complaints of numbness, or burning.  No edema is noted. Hypoglycemia: He has many low BG alerts, but no real low BGs, .  Emotional: "I'm not good."    5. Pump printout: He changes sites every 1-4 days. Average BG is 168.   6. CGM printout: He has been in auto mode 99%. He wears his sensor 94%. Average SG is 149. Time in Range is 82%. Time above range is 18%.  SGS average about 170 at midnight. SGs decrease to about 130 in the mornings. SGs increase after lunch and somewhat after dinner.   PAST MEDICAL, FAMILY, AND SOCIAL HISTORY:  Past Medical History:  Diagnosis Date  . ADHD (attention deficit hyperactivity disorder)   . Asthma   . Autonomic neuropathy due to diabetes ([place])   . Diabetes ([place])   . Fatigue   . Goiter   . High blood pressure   . High cholesterol   . Hypercholesterolemia   . Hypertension   . Hypoglycemia associated with diabetes ([place])   . Obesity   . Tachycardia   . Type 1 diabetes mellitus not at goal [facility])   . Uncontrolled DM with microalbuminuria or microproteinuria     Family History  Problem Relation Age of Onset  . Dementia Paternal Aunt   . Schizophrenia Paternal Aunt   . Cancer Maternal Grandmother   . Diabetes Maternal Grandmother        T2 DM  . Cancer  Maternal Grandfather   . Heart disease Father   . Thyroid disease Neg Hx      Current Outpatient Medications:  .  acetaZOLAMIDE (DIAMOX) 500 MG capsule, TAKE 2 CAPSULES BY MOUTH DAILY IN THE MORNING AND 1 CAPSULE IN EVENING, Disp: 90 capsule, Rfl: 0 .  albuterol (PROAIR HFA) 108 (90 Base) MCG/ACT inhaler, Inhale 2 puffs into the lungs every 6 (six) hours as needed for wheezing or shortness of breath., Disp: 1 Inhaler, Rfl: 2 .  albuterol (PROAIR HFA) 108 (90 Base) MCG/ACT inhaler, Inhale 2 puffs into the lungs every 4 (four) hours as needed for wheezing or shortness of breath., Disp: 18 g, Rfl: 1 .  albuterol (PROVENTIL) (2.5 MG/3ML) 0.083% nebulizer solution, Take 3 mLs (2.5 mg total) by nebulization every 4 (four) hours as needed for wheezing or shortness of breath., Disp: 75 mL, Rfl: 1 .  ALPRAZolam (XANAX) 0.25 MG tablet, Take by mouth., Disp: , Rfl:  .  atorvastatin (LIPITOR) 20 MG tablet, Take 20 mg by mouth daily. , Disp: , Rfl: 0 .  AUVI-Q 0.3 MG/0.3ML SOAJ injection, Use as directed for life-threatening allergic reaction., Disp: 2 each, Rfl: 2 .  budesonide (PULMICORT) 0.5 MG/2ML nebulizer solution, Take 2 mLs (0.5 mg total) by nebulization as directed., Disp: 2 mL, Rfl: 3 .  budesonide-formoterol (SYMBICORT) 160-4.5 MCG/ACT inhaler, Inhale 2 puffs into the lungs 2 (two) times daily., Disp: 1 Inhaler, Rfl: 5 .  EPINEPHrine (AUVI-Q) 0.3 mg/0.3 mL IJ SOAJ injection, Use as directed for severe allergic reactions, Disp: 2 each, Rfl: 1 .  glucagon 1 MG injection, Follow package directions for low blood sugar., Disp: 1 each, Rfl: 3 .  HUMALOG 100 UNIT/ML injection, INJECT 300 UNITS UNDER THE SKIN VIA INSULIN PUMP EVERY 48 TO 72 HOURS PER HYPERGLYCEMIA AND DKA PROTOCOLS, Disp: 120 mL, Rfl: 5 .  levocetirizine (XYZAL) 5 MG tablet, Take 5 mg by mouth daily as needed for allergies. , Disp: , Rfl:  .  losartan (COZAAR) 50 MG tablet, TAKE 1 TABLET(50 MG) BY MOUTH  DAILY, Disp: 90 tablet, Rfl: 1 .   methocarbamol (ROBAXIN) 500 MG tablet, Take 1 tablet (500 mg total) by mouth 2 (two) times daily., Disp: 60 tablet, Rfl: 1 .  montelukast (SINGULAIR) 10 MG tablet, Take 1 tablet (10 mg total) by mouth at bedtime., Disp: 30 tablet, Rfl: 5 .  mupirocin ointment (BACTROBAN) 2 %, mupirocin 2 % topical ointment  APP EXT IEN BID, Disp: , Rfl:  .  NUCALA 100 MG/ML SOAJ, INJECT 100MG SUBCUTANEOUSLY EVERY 30 DAYS (GIVEN AT MD  OFFICE), Disp: 1 mL, Rfl: 11 .  nystatin (MYCOSTATIN) 100000 UNIT/ML suspension, Swish and swallow 5 mls daily (Patient taking differently: Use as directed 5 mLs in the mouth or throat daily as needed (for thrush). Swish and swallow 5 mls daily), Disp: 150 mL, Rfl: 2 .  omeprazole (PRILOSEC) 20 MG capsule, Take 20 mg by mouth daily as needed (for reflux). , Disp: , Rfl:  .  ONETOUCH ULTRA test strip, USE TO CHECK BLOOD SUGAR 6 TIMES DAILY AS DIRECTED, Disp: 200 strip, Rfl: 5 .  topiramate (TOPAMAX) 50 MG tablet, TAKE 1 TABLET(50 MG) BY MOUTH TWICE DAILY, Disp: 60 tablet, Rfl: 1 .  acetaminophen (TYLENOL) 500 MG tablet, Take 1,000 mg by mouth 2 (two) times daily as needed. (Patient not taking: Reported on 05/14/2020), Disp: , Rfl:  .  amitriptyline (ELAVIL) 50 MG tablet, Take 1 tablet (50 mg total) by mouth at bedtime. (Patient not taking: Reported on 05/14/2020), Disp: 30 tablet, Rfl: 0 .  ASMANEX HFA 200 MCG/ACT AERO, INHALE 2 PUFFS INTO THE LUNGS TWICE DAILY (Patient not taking: Reported on 05/14/2020), Disp: 13 g, Rfl: 5  Current Facility-Administered Medications:  [name]  Mepolizumab SOLR 100 mg, 100 mg, Subcutaneous, Q28 days, Valentina Shaggy, MD, 100 mg at 02/16/19 1018  Facility-Administered Medications Ordered in Other Visits:  .  gadopentetate dimeglumine (MAGNEVIST) injection 20 mL, 20 mL, Intravenous, Once PRN, Dohmeier, Asencion Partridge, MD  Allergies as of 05/14/2020 - Review Complete 05/14/2020  Allergen Reaction Noted  . Flonase [fluticasone] Shortness Of Breath 06/10/2017  .  Molds & smuts Other (See Comments) 03/09/2018  . Sulfites Itching and Swelling 02/04/2018  . Tetracyclines & related Other (See Comments) 03/09/2018  . White birch Cough 04/10/2020    1. Work and Family: He can no longer work part-time at home. 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  2. Activities: He walks every day, but is in pain.  3. Smoking, alcohol, or drugs: He has not been drinking any alcohol or beer. No tobacco or drugs. 4. Primary Care Provider: He no longer sees Dr. Domenick Gong, MD, at [facility]. He has a new PCP, Ms Mattie Marlin, PA, [facility].  5. Neurology: Dr. Brett Fairy 6. Ophthalmology:  7. Retinology: Dr. Zadie Rhine 8. Urology: Dr. Jeffie Pollock at [facility] 9. Sports medicine: Dr. Oneida Alar 10. Therapy: Ms Rea College, RN, MSN  REVIEW OF SYSTEMS: There are no other significant problems involving Tara's other body systems.   Objective:  Vital Signs:  BP 132/84   Wt 234 lb (106.1 kg)   BMI 31.74 kg/m  heart rate 88   Ht Readings from Last 3 Encounters:  04/26/20 6' (1.829 m)  04/17/20 6' (1.829 m)  02/23/20 6' (1.829 m)   Wt Readings from Last 3 Encounters:  05/14/20 234 lb (106.1 kg)  04/26/20 235 lb (106.6 kg)  04/17/20 235 lb (106.6 kg)   PHYSICAL EXAM:  Constitutional: The patient  appears tired and antalgic today His affect is very low. His insight is good. He engaged fairly well today. He has gained 8 pounds since September 2020.  Eyes: There is no arcus or proptosis.  Mouth: The oropharynx appears normal. The tongue appears normal. There is normal oral moisture. There is no obvious gingivitis. Neck: There are no bruits present. The thyroid gland appears normal. The thyroid gland is mildly enlarged at about 21 grams in size. The consistency of the thyroid gland is normal. There is no thyroid tenderness to palpation. His cervical ROM is poor. He has a great deal of pain in the  right nuchal cord and some in the left. He also has pain in the paraspinous areas, especially on the right.   Lungs: The lungs are clear. Air movement is good. Heart: The heart rhythm and rate appear normal. Heart sounds S1 and S2 are normal. I do not appreciate any pathologic heart murmurs. Abdomen: The abdominal size is obese. Bowel sounds are normal. The abdomen is soft and non-tender. There is no obviously palpable hepatomegaly, splenomegaly, or other masses.  Arms: Muscle mass appears appropriate for age. Radial pulses appear normal. Hands: There is no obvious tremor. Phalangeal and metacarpophalangeal joints appear normal. Palms are normal. Legs: Muscle mass appears appropriate for age. There is no edema.  Feet: There are no significant deformities. Dorsalis pedis pulses are normal bilaterally.  Neurologic: CN II- XII intact. Muscle strength is 4/5 due to antalgia in his UEs, but 5/5 in the LEs. Muscle tone appears normal. Sensation to touch is normal in the legs and feet.   LAB DATA:   Labs 05/14/20: HbA1c 6.5%, CBG 176  Labs 04/17/20: ESR 6 (ref 0-15), CK 82 (ref 49-439)  Labs 07/12/19: HbA1c 6.4%, CBG 231  Labs 04/26/19: HbA1c 6.8%, CBG 230  Labs 12/30/18: TSH 2.07, free T4 1.1, free T3 3.2, TPO antibody <1, thyroglobulin antibody <1; CMP normal, except glucose 191; cholesterol 165, triglycerides 138, HDL 33, LDL 106; urinary microalbumin/creatinine ratio 5; vitamin B1 10 (ref 8-30), vitamin B6 3.4 (ref 2.1-21.7), vitamin B12 451 (ref [phone])  Labs 11/29/18: CBG 179  Labs 11/29/18: CMP with glucose 198, chloride 109 (ref 96-106, CO2 17 (ref 20-29); CBC with WBC 11.6 (ref 3.4-10.8), PMNs 8.6 (ref 1.4-7.0), and monocytes 1.0 (ref 0.1-0.9)  Labs 10/28/18: HbA1c 6.9%, CBG 155  Labs 10/06/18: ANA titer negative, ANCA titers negative, rheumatoid factor negative, cyclic citrullin peptide 7 (ref 0-190  Labs 07/27/18: CBG 193  Labs 05/31/18: HbA1c 6.6%, CBG 202  Labs 03/18/18: CBG  178  Labs 01/13/18: HbA1c 6.8%, CBG 194; TSH 0.98, free T4 1.3; free T3 3.6; CMP normal except glucose 191; urinary microalbumin/creatinine ratio 35 (ref <30)  Labs 11/17/17: CBG 170  Labs 10/13/17: HbA1c 6.8%, CBG 191  Labs 08/24/17: CBG 117  Labs 07/20/17: CBG 216  Labs 07/08/17: IgA, IgG, and IgM normal. CBC normal.  Labs 06/16/17: HbA1c 6.5%, CBG 193  Labs 05/15/17: CBG 209; TSH 0.90, free T4 1.2, free T3 3.8  Labs 03/05/17: HbA1c 7.3%, CBG 241  Labs 02/20/17: CBG 180 fasting  Labs 01/29/17: CBG 148  Labs 01/12/17: CBG 116  Labs 12/29/16: HbA1c 9.1%, CBG 223; TSH 0.62, free T4 1.1, free T3 3.5; urine microalbumin/creatinine ratio 22; cholesterol 171, triglycerides 93, HDL 40, LDL 112;  CMP glucose 201  Labs 09/26/16: HbA1c 9.5%  Labs 08/13/16: Celiac panel negative; CMP normal except for glucose 207; CBC normal  Labs 06/13/16: HbA1c 9.8%; LH 3.6, FSH 2.3, testosterone 533 (  ref [id number]), free testosterone 59.4 (ref 47-244)  Labs 02/11/16: HbA1c 9.2%  Labs 01/07/16: HbA1c 10.5%; CBC with Hgb elevated at 17.2%; LH 3.9, FSH 2.0; TSH 1.53, free T4 1.3, free T3 3.7; cholesterol 262, triglycerides 171, HDL 40, LDL 188; urinary microalbumin/creatinine ratio 31; CMP normal except for glucose 181  Labs 06/29/15. HbA1c 9.6%  Labs 12/28/14: Hemoglobin A1c 9.5% today, compared with 9.5%, at last visit and with 9.9% at the prior visit.  Labs 08/03/14: HbA1c 9.5%; TSH 0.548,free T4 1.10, free T3 3.7, TPO antibody < 1, TSI 30; CMP normal except glucose 191; urinary microalbumin/creatinine ratio 27; cholesterol 260, triglycerides 90, HDL 51, LDL 191   Labs 10/05/13: HbA1c 9.9%  Labs 04/23/13: CMP normal, except glucose 144; cholesterol 216, triglycerides 97, HDL 39, LDL 158; urinary microalbumin/creatinine ratio 6.9; TSH 0.505, free T4 1.17, free T3 3.8  Labs: 01/21/12: TSH 1.251, free T4 1.10, free T3 3.6, CBC normal, CMP normal except for glucose 206, urinary microalbumin/creatinine ratio 6.3,  cholesterol 187, triglycerides 67, HDL 45, LDL 129   Assessment and Plan:   ASSESSMENT:  1. T1DM:   A. His SGs have been mostly <200, reflecting fairly good DM.     BChester [name] has done very well at trying to optimize his BG control.   2. Hypoglycemia: He has not had many SGs <70. 3. Hypertension: His DBP is elevated today, presumably due to pain and to not sleeping.    4. Combined hyperlipidemia: His lipids in March 2017 were too high, but the lipids in March 2018 were much better on atorvastatin. He discontinued the atorvastatin in mid-2018 due to concern that this medication might be causing his neuromuscular symptoms. His lipids in March 2020 were better. We will plan to re-start the atorvastatin in the future once he becomes stable on immunotherapy.  5. Autonomic neuropathy with tachycardia and gastroparesis: His gastroparesis has improved in parallel with his BG improvement. His heart rate has improved as well. .  6. Obesity: Weight has increased.     7-8. Goiter/thyroiditis:   A. His thyroid gland had shrunk back to normal size at his last visit, but has increased slightly today. His thyroiditis is clinically quiescent. The active thyroiditis that he has had in the past and the  process of waxing and waning of thyroid gland size are c/w evolving Hashimoto's thyroiditis.   B. He was euthyroid in March 2013, but was borderline hyperthyroid in June 2014. He was euthyroid in October 2015, March 2017, March 2018, July 2018, in March 2019, and in March 2020.  9. Fatigue: This problem is much worse today..   10. Obstructive sleep apnea: He did not meet the criteria for OSA, but because of his snoring a dental appliance was suggested. I recommended that he obtain a dental appliance. Unfortunately, his former dentist no longer accepts [employer], so [name] has to find a new dentist. 58. Adult ADD: He had an appointment with Dr. Carlena Hurl, MD in [place] in November 2018. Dr. Wylene Simmer  is waiting for [name]'s medical situation to stabilize before beginning any new psych meds.   12. Proliferative diabetic retinopathy: This problem is due to his long period of poorly controlled BGs. The retinopathy was worse earlier in July 2019, but better in August and October 2020.  13. URI/nasal congestion/vertigo/allergic rhinitis and conjunctivitis: He is not having these problems today. His family still wants him to go to the [facility] for a comprehensive evaluation. At this point he is doing  better, so he is not interested in pursuing this option.   14. Neuromuscular symptoms and headaches/increased intracranial pressure:   A. It appeared previously that some of [name]'s headaches were classic tension headaches. When he performed cervical stretching exercises the headache resolved.   B. In the past when Dr. Brett Fairy evaluated [name] for his vague neuromuscular symptoms, she did not find any signs of any serious neurologic disease. His MRI of his brain was unremarkable. At that time his neuromuscular symptoms had resolved.  C. Later, however, the headaches worsened and some of the neuromuscular symptoms recurred. We now know that he had increased ICP and that his headaches improved for a brief time after the LP. He then developed a typical spinal tap headache.  D. In retrospect he may have had intermittent problems with elevated ICP previously.  E. Dr. Brett Fairy suspects that [name]'s ICP was due to the doxycycline. She will follow this issue over time.   F. He appeared to have problems taking his prior dose of Diamox, but is doing better on the combination of a lower dose of Diamox and Topamax.  15. Calcified lymph node: It is possible that he may have contracted a fungus while he was working in the [employer]. several years ago. His thyroid US showed his goiter, but no intrinsic thyroid abnormality. A partially calcified cervical lymph node on the right was noted, c/w a post infections/inflammatory  node. 16. Microalbuminuria: His ratio was mildly elevated in March 2019. He needed tighter BG and BP control. His ratio in March 2020 was very good.  17. Anhedonia: [name] is depressed today. He feels better, but wants to be normal. His sessions with Ms [employer]a [employer], RN, MSN, a psychiatric nurse specialist, have been very beneficial. He still needs to find more sources of fun.  18. Neck pain/Cervical neuritis/neuromuscular pain: [name] has seen Dr. Oneida Alar several times. Dr. Oneida Alar had planned to obtain a neck xray if [name]'s neck did not improve. Since his neck symptoms are worse, and since Dr. Oneida Alar is out of town, I tried to reach the doctor on call at the [facility] so that study could be ordered. When I was not able to talk with anyone at that clinic, I asked [name] to stop by there immediately after his visit with me.   PLAN:  1. Diagnostic:  Repeat HbA1c and CBG at his next visit.  2. Therapeutic: Follow DM care plan. Continue losartan. Continue his current pump basal rates, ISFs, ICRs, and BG targets.   Basal rates: basal 1 MN:1.20 6 AM: 1.90 2 PM: 1.60 8 PM: 1.65   ISFs: 35   ICRs: MN: 7 8 AM: 4.5 11 AM: 4.5 9 PM: 7   BG targets:  MN: 130-150 6 AM: 120 10 PM 130-150  3. Patient education:   A. We discussed the illnesses that he has had for the past two years.  We also discussed the need for him to continue to be followed closely by Drs Osborne Casco, Kozlow, Dohmeier, Rankin, Cira Servant, Ms [place], and Dr. Wylene Simmer when he can again do so. .   B.  I recommended he see Sports Medicine today.  4. Follow-up:  2 months   Level of Service: This visit lasted in excess of 55 minutes. More than 50% of the visit was devoted to counseling.  Tillman Sers, MD, CDE Adult and Pediatric Endocrinology

## 2020-05-15 LAB — TSH: TSH: 2.44 mIU/L (ref 0.40–4.50)

## 2020-05-15 LAB — MICROALBUMIN / CREATININE URINE RATIO
Creatinine, Urine: 196 mg/dL (ref 20–320)
Microalb Creat Ratio: 2 mcg/mg creat (ref <30)
Microalb, Ur: 0.4 mg/dL

## 2020-05-15 LAB — T4, FREE: Free T4: 1.1 ng/dL (ref 0.8–1.8)

## 2020-05-15 LAB — T3, FREE: T3, Free: 3.5 pg/mL (ref 2.3–4.2)

## 2020-05-16 ENCOUNTER — Ambulatory Visit: Payer: 59 | Admitting: Family Medicine

## 2020-05-16 ENCOUNTER — Other Ambulatory Visit: Payer: Self-pay

## 2020-05-16 VITALS — BP 124/86 | Ht 72.0 in | Wt 234.0 lb

## 2020-05-16 DIAGNOSIS — M542 Cervicalgia: Secondary | ICD-10-CM | POA: Diagnosis not present

## 2020-05-16 NOTE — Progress Notes (Signed)
PCP: Aura Dials, PA-C  Subjective:   HPI: Patient is a 39 y.o. male here for follow up on neck pain. He was seen 7/1 for this, and at that time was started on robaxin and home exercises. He reports that since that visit, his pain is worse. He continues to have neck pain, with numbness and tinging in bilateral upper extremities at times in 4th and 5th digits and at times in all fingers. He has not noted weakness in his upper extremities. He also notes that he has been having diffuse muscle fasciculations intermittently in all extremities, along with an intermittent sensation of vibration.  No bowel/bladder dysfunction.  He had a similar episode on 2018 which was treated conservatively with muscle relaxants and PT. Labs on previous visit showed normal ESR and CPK.   Past Medical History:  Diagnosis Date  . ADHD (attention deficit hyperactivity disorder)   . Asthma   . Autonomic neuropathy due to diabetes ([place])   . Diabetes ([place])   . Fatigue   . Goiter   . High blood pressure   . High cholesterol   . Hypercholesterolemia   . Hypertension   . Hypoglycemia associated with diabetes ([place])   . Obesity   . Tachycardia   . Type 1 diabetes mellitus not at goal [facility])   . Uncontrolled DM with microalbuminuria or microproteinuria     Current Outpatient Medications on File Prior to Visit  Medication Sig Dispense Refill  . acetaminophen (TYLENOL) 500 MG tablet Take 1,000 mg by mouth 2 (two) times daily as needed. (Patient not taking: Reported on 05/14/2020)    . acetaZOLAMIDE (DIAMOX) 500 MG capsule TAKE 2 CAPSULES BY MOUTH DAILY IN THE MORNING AND 1 CAPSULE IN EVENING 90 capsule 0  . albuterol (PROAIR HFA) 108 (90 Base) MCG/ACT inhaler Inhale 2 puffs into the lungs every 6 (six) hours as needed for wheezing or shortness of breath. 1 Inhaler 2  . albuterol (PROAIR HFA) 108 (90 Base) MCG/ACT inhaler Inhale 2 puffs into the lungs every 4 (four) hours as needed for wheezing or shortness of breath.  18 g 1  . albuterol (PROVENTIL) (2.5 MG/3ML) 0.083% nebulizer solution Take 3 mLs (2.5 mg total) by nebulization every 4 (four) hours as needed for wheezing or shortness of breath. 75 mL 1  . ALPRAZolam (XANAX) 0.25 MG tablet Take by mouth.    [name] amitriptyline (ELAVIL) 50 MG tablet Take 1 tablet (50 mg total) by mouth at bedtime. (Patient not taking: Reported on 05/14/2020) 30 tablet 0  . ASMANEX HFA 200 MCG/ACT AERO INHALE 2 PUFFS INTO THE LUNGS TWICE DAILY (Patient not taking: Reported on 05/14/2020) 13 g 5  . atorvastatin (LIPITOR) 20 MG tablet Take 20 mg by mouth daily.   0  . AUVI-Q 0.3 MG/0.3ML SOAJ injection Use as directed for life-threatening allergic reaction. 2 each 2  . budesonide (PULMICORT) 0.5 MG/2ML nebulizer solution Take 2 mLs (0.5 mg total) by nebulization as directed. 2 mL 3  . budesonide-formoterol (SYMBICORT) 160-4.5 MCG/ACT inhaler Inhale 2 puffs into the lungs 2 (two) times daily. 1 Inhaler 5  . EPINEPHrine (AUVI-Q) 0.3 mg/0.3 mL IJ SOAJ injection Use as directed for severe allergic reactions 2 each 1  . glucagon 1 MG injection Follow package directions for low blood sugar. 1 each 3  . HUMALOG 100 UNIT/ML injection INJECT 300 UNITS UNDER THE SKIN VIA INSULIN PUMP EVERY 48 TO 72 HOURS PER HYPERGLYCEMIA AND DKA PROTOCOLS 120 mL 5  . levocetirizine (XYZAL)  5 MG tablet Take 5 mg by mouth daily as needed for allergies.     [name] losartan (COZAAR) 50 MG tablet TAKE 1 TABLET(50 MG) BY MOUTH DAILY 90 tablet 1  . methocarbamol (ROBAXIN) 500 MG tablet Take 1 tablet (500 mg total) by mouth 2 (two) times daily. 60 tablet 1  . montelukast (SINGULAIR) 10 MG tablet Take 1 tablet (10 mg total) by mouth at bedtime. 30 tablet 5  . mupirocin ointment (BACTROBAN) 2 % mupirocin 2 % topical ointment  APP EXT IEN BID    . NUCALA 100 MG/ML SOAJ INJECT 100MG SUBCUTANEOUSLY EVERY 30 DAYS (GIVEN AT MD  OFFICE) 1 mL 11  . nystatin (MYCOSTATIN) 100000 UNIT/ML suspension Swish and swallow 5 mls daily (Patient  taking differently: Use as directed 5 mLs in the mouth or throat daily as needed (for thrush). Swish and swallow 5 mls daily) 150 mL 2  . omeprazole (PRILOSEC) 20 MG capsule Take 20 mg by mouth daily as needed (for reflux).     Glory Rosebush ULTRA test strip USE TO CHECK BLOOD SUGAR 6 TIMES DAILY AS DIRECTED 200 strip 5  . topiramate (TOPAMAX) 50 MG tablet TAKE 1 TABLET(50 MG) BY MOUTH TWICE DAILY 60 tablet 1   Current Facility-Administered Medications on File Prior to Visit  Medication Dose Route Frequency Provider Last Rate Last Admin  . gadopentetate dimeglumine (MAGNEVIST) injection 20 mL  20 mL Intravenous Once PRN Dohmeier, Asencion Partridge, MD      . Mepolizumab SOLR 100 mg  100 mg Subcutaneous Q28 days Valentina Shaggy, MD   100 mg at 02/16/19 1018    Past Surgical History:  Procedure Laterality Date  . REFRACTIVE SURGERY     x9  . WISDOM TOOTH EXTRACTION      Allergies  Allergen Reactions  . Flonase [Fluticasone] Shortness Of Breath  . Molds & Smuts Other (See Comments)    Aggravate asthma  . Sulfites Itching and Swelling  . Tetracyclines & Related Other (See Comments)    increased intracranial pressure  . White [place]ch Cough    Social History   Socioeconomic History  . Marital status: Single    Spouse name: Not on file  . Number of children: Not on file  . Years of education: Not on file  . Highest education level: Not on file  Occupational History  . Not on file  Tobacco Use  . Smoking status: Never Smoker  . Smokeless tobacco: Never Used  Vaping Use  . Vaping Use: Never used  Substance and Sexual Activity  . Alcohol use: No  . Drug use: No  . Sexual activity: Not on file  Other Topics Concern  . Not on file  Social History Narrative   ** Merged History Encounter **       Social Determinants of Health   Financial Resource Strain:   . Difficulty of Paying Living Expenses:   Food Insecurity:   . Worried About Charity fundraiser in the Last Year:   . Youth worker in the Last Year:   Transportation Needs:   . Film/video editor (Medical):   [name] Lack of Transportation (Non-Medical):   Physical Activity:   . Days of Exercise per Week:   . Minutes of Exercise per Session:   Stress:   . Feeling of Stress :   Social Connections:   . Frequency of Communication with Friends and Family:   . Frequency of Social Gatherings with Friends and Family:   .  Attends Religious Services:   . Active Member of Clubs or Organizations:   . Attends Archivist Meetings:   [name] Marital Status:   Intimate Partner Violence:   . Fear of Current or Ex-Partner:   . Emotionally Abused:   [name] Physically Abused:   . Sexually Abused:     Family History  Problem Relation Age of Onset  . Dementia Paternal Aunt   . Schizophrenia Paternal Aunt   . Cancer Maternal Grandmother   . Diabetes Maternal Grandmother        T2 DM  . Cancer Maternal Grandfather   . Heart disease Father   . Thyroid disease Neg Hx     BP 124/86   Ht 6' (1.829 m)   Wt 234 lb (106.1 kg)   BMI 31.74 kg/m   Review of Systems: See HPI above.     Objective:  Physical Exam:  Gen: NAD, comfortable in exam room Psych: depressed mood with anxiety  Neck: Inspection: pt in mild lordosis, minimal movement at the neck Palpation: TTP of paraspinal muscles and trapezius muscles. No midline tenderness or stepoffs ROM: decreased ROM especially with extension and rotation of the neck bilaterally.  Only mild limitation with flexion. Strength: 5/5 strength of bilateral upper extremities Neurovascularly intact Decreased reflexes of bilateral upper and lower extremities, equal bilaterally    Assessment & Plan:  1. Neck Pain Pt with worsening neck pain which has failed conservative tx with muscle relaxants and home exercises. Ddx for his pain includes cervical radiculopathy vs. MSK neck pain vs. Fibromyalgia. Suspect that this is multifactorial and also with distribution that is nonanatomic.  We will plan to continue current medication regimen, obtain XR cervical spine, and if negative proceed to MRI of the cervical spine to assess for cervical spinal stenosis causing symptoms in neck and bilateral upper extremities. Will also refer for formal PT.

## 2020-05-16 NOTE — Patient Instructions (Signed)
The cause of your neck pain is not completely clear. Please go to get X-rays of your neck as directed, and we will be in touch about the results. If they are not conclusive, we will likely get an MRI of your neck. In the meantime: -continue tylenol and ibuprofen as needed -continue robaxin as needed -continue home exercises, we will also send in a referral for formal Physical Therapy -you may also try topical voltaren gel, capsaicin cream, or biofreeze

## 2020-05-17 ENCOUNTER — Encounter: Payer: Self-pay | Admitting: Family Medicine

## 2020-05-17 ENCOUNTER — Ambulatory Visit
Admission: RE | Admit: 2020-05-17 | Discharge: 2020-05-17 | Disposition: A | Discharge status: Home / Self Care | Payer: 59 | Source: Ambulatory Visit | Attending: Family Medicine | Admitting: Family Medicine

## 2020-05-17 DIAGNOSIS — M542 Cervicalgia: Secondary | ICD-10-CM

## 2020-05-21 ENCOUNTER — Telehealth: Payer: Self-pay

## 2020-05-21 DIAGNOSIS — M542 Cervicalgia: Secondary | ICD-10-CM

## 2020-05-21 NOTE — Telephone Encounter (Signed)
MRI ordered placed. Pt will call [facility] to schedule.

## 2020-05-25 ENCOUNTER — Encounter (INDEPENDENT_AMBULATORY_CARE_PROVIDER_SITE_OTHER): Payer: Self-pay

## 2020-05-25 LAB — HM DIABETES EYE EXAM

## 2020-05-28 ENCOUNTER — Other Ambulatory Visit: Payer: Self-pay

## 2020-05-28 ENCOUNTER — Ambulatory Visit (INDEPENDENT_AMBULATORY_CARE_PROVIDER_SITE_OTHER): Payer: 59 | Admitting: Ophthalmology

## 2020-05-28 ENCOUNTER — Encounter (INDEPENDENT_AMBULATORY_CARE_PROVIDER_SITE_OTHER): Payer: Self-pay | Admitting: Ophthalmology

## 2020-05-28 DIAGNOSIS — E103551 Type 1 diabetes mellitus with stable proliferative diabetic retinopathy, right eye: Secondary | ICD-10-CM | POA: Insufficient documentation

## 2020-05-28 DIAGNOSIS — G932 Benign intracranial hypertension: Secondary | ICD-10-CM | POA: Diagnosis not present

## 2020-05-28 DIAGNOSIS — E103552 Type 1 diabetes mellitus with stable proliferative diabetic retinopathy, left eye: Secondary | ICD-10-CM

## 2020-05-28 DIAGNOSIS — H4311 Vitreous hemorrhage, right eye: Secondary | ICD-10-CM | POA: Diagnosis not present

## 2020-05-28 DIAGNOSIS — E103512 Type 1 diabetes mellitus with proliferative diabetic retinopathy with macular edema, left eye: Secondary | ICD-10-CM | POA: Insufficient documentation

## 2020-05-28 DIAGNOSIS — E103591 Type 1 diabetes mellitus with proliferative diabetic retinopathy without macular edema, right eye: Secondary | ICD-10-CM | POA: Diagnosis not present

## 2020-05-28 DIAGNOSIS — E103511 Type 1 diabetes mellitus with proliferative diabetic retinopathy with macular edema, right eye: Secondary | ICD-10-CM | POA: Insufficient documentation

## 2020-05-28 MED ORDER — PREDNISOLONE ACETATE 1 % OP SUSP
1.0000 [drp] | Freq: Four times a day (QID) | OPHTHALMIC | 0 refills | Status: DC
Start: 2020-05-28 — End: 2020-08-16

## 2020-05-28 MED ORDER — MOXIFLOXACIN HCL 0.5 % OP SOLN: 1.0000 [drp] | Freq: Three times a day (TID) | OPHTHALMIC | 0 refills | Status: AC

## 2020-05-28 NOTE — Assessment & Plan Note (Signed)
Newly active NVE along the temporal arcade with preretinal hemorrhage breakthrough and now vitreous hemorrhage.

## 2020-05-28 NOTE — Progress Notes (Signed)
05/28/2020     CHIEF COMPLAINT Patient presents for Retina Evaluation   HISTORY OF PRESENT ILLNESS: [name] is a 39 y.o. male who presents to the clinic today for:   HPI    Retina Evaluation    In right eye.  This started 3 months ago.  Duration of 3 months.  Associated Symptoms Floaters.  Context:  distance vision, mid-range vision and near vision.  Treatments tried include no treatments.          Comments    New heme OD - Referred back by Dr. Dione BoozeGroat  Pt c/o new bleed OD since Friday x 3 days ago. Pt sts he can see a cloud at the top of his VA OD. Pt denies ocular pain. Pt reports stable VA OS. A1c: 6.5, 3 weeks ago LBS: 120 this AM       Last edited by Ileana RoupKennerly, Paige, COA on 05/28/2020  8:19 AM. (History)      Referring physician: Teena IraniSpencer, [name], PA-C [address] [place] [id number]  HISTORICAL INFORMATION:   Selected notes from the MEDICAL RECORD NUMBER    Lab Results  Component Value Date   HGBA1C 6.5 (A) 05/14/2020     CURRENT MEDICATIONS: Current Outpatient Medications (Ophthalmic Drugs)  Medication Sig  . moxifloxacin (VIGAMOX) 0.5 % ophthalmic solution Place 1 drop into both eyes 3 (three) times daily for 7 days.  . prednisoLONE acetate (PRED FORTE) 1 % ophthalmic suspension Place 1 drop into the right eye 4 (four) times daily.   No current facility-administered medications for this visit. (Ophthalmic Drugs)   Current Outpatient Medications (Other)  Medication Sig  . acetaminophen (TYLENOL) 500 MG tablet Take 1,000 mg by mouth 2 (two) times daily as needed. (Patient not taking: Reported on 05/14/2020)  . acetaZOLAMIDE (DIAMOX) 500 MG capsule TAKE 2 CAPSULES BY MOUTH DAILY IN THE MORNING AND 1 CAPSULE IN EVENING  . albuterol (PROAIR HFA) 108 (90 Base) MCG/ACT inhaler Inhale 2 puffs into the lungs every 6 (six) hours as needed for wheezing or shortness of breath.  [name]. albuterol (PROAIR HFA) 108 (90 Base) MCG/ACT inhaler Inhale 2 puffs  into the lungs every 4 (four) hours as needed for wheezing or shortness of breath.  [name]. albuterol (PROVENTIL) (2.5 MG/3ML) 0.083% nebulizer solution Take 3 mLs (2.5 mg total) by nebulization every 4 (four) hours as needed for wheezing or shortness of breath.  . ALPRAZolam (XANAX) 0.25 MG tablet Take by mouth.  [name]. amitriptyline (ELAVIL) 50 MG tablet Take 1 tablet (50 mg total) by mouth at bedtime. (Patient not taking: Reported on 05/14/2020)  . ASMANEX HFA 200 MCG/ACT AERO INHALE 2 PUFFS INTO THE LUNGS TWICE DAILY (Patient not taking: Reported on 05/14/2020)  . atorvastatin (LIPITOR) 20 MG tablet Take 20 mg by mouth daily.   [name]. AUVI-Q 0.3 MG/0.3ML SOAJ injection Use as directed for life-threatening allergic reaction.  . budesonide (PULMICORT) 0.5 MG/2ML nebulizer solution Take 2 mLs (0.5 mg total) by nebulization as directed.  . budesonide-formoterol (SYMBICORT) 160-4.5 MCG/ACT inhaler Inhale 2 puffs into the lungs 2 (two) times daily.  [name]. EPINEPHrine (AUVI-Q) 0.3 mg/0.3 mL IJ SOAJ injection Use as directed for severe allergic reactions  . glucagon 1 MG injection Follow package directions for low blood sugar.  [name]. HUMALOG 100 UNIT/ML injection INJECT 300 UNITS UNDER THE SKIN VIA INSULIN PUMP EVERY 48 TO 72 HOURS PER HYPERGLYCEMIA AND DKA PROTOCOLS  . levocetirizine (XYZAL) 5 MG tablet Take 5 mg by  mouth daily as needed for allergies.   [name] losartan (COZAAR) 50 MG tablet TAKE 1 TABLET(50 MG) BY MOUTH DAILY  . methocarbamol (ROBAXIN) 500 MG tablet Take 1 tablet (500 mg total) by mouth 2 (two) times daily.  . montelukast (SINGULAIR) 10 MG tablet Take 1 tablet (10 mg total) by mouth at bedtime.  . mupirocin ointment (BACTROBAN) 2 % mupirocin 2 % topical ointment  APP EXT IEN BID  . NUCALA 100 MG/ML SOAJ INJECT 100MG  SUBCUTANEOUSLY EVERY 30 DAYS (GIVEN AT MD  OFFICE)  . nystatin (MYCOSTATIN) 100000 UNIT/ML suspension Swish and swallow 5 mls daily (Patient taking differently: Use as directed 5 mLs in the mouth or  throat daily as needed (for thrush). Swish and swallow 5 mls daily)  . omeprazole (PRILOSEC) 20 MG capsule Take 20 mg by mouth daily as needed (for reflux).   ULTRA test strip USE TO CHECK BLOOD SUGAR 6 TIMES DAILY AS DIRECTED  . topiramate (TOPAMAX) 50 MG tablet TAKE 1 TABLET(50 MG) BY MOUTH TWICE DAILY   Current Facility-Administered Medications (Other)  Medication Route  . Mepolizumab SOLR 100 mg Subcutaneous   Facility-Administered Medications Ordered in Other Visits (Other)  Medication Route  . gadopentetate dimeglumine (MAGNEVIST) injection 20 mL Intravenous      REVIEW OF SYSTEMS:    ALLERGIES Allergies  Allergen Reactions  . Flonase [Fluticasone] Shortness Of Breath  . Molds & Smuts Other (See Comments)    Aggravate asthma  . Sulfites Itching and Swelling  . Tetracyclines & Related Other (See Comments)    increased intracranial pressure  . White [place]ch Cough    PAST MEDICAL HISTORY Past Medical History:  Diagnosis Date  . ADHD (attention deficit hyperactivity disorder)   . Asthma   . Autonomic neuropathy due to diabetes ([facility])   . Diabetes ([facility])   . Fatigue   . Goiter   . High blood pressure   . High cholesterol   . Hypercholesterolemia   . Hypertension   . Hypoglycemia associated with diabetes ([facility])   . Obesity   . Tachycardia   . Type 1 diabetes mellitus not at goal [facility])   . Uncontrolled DM with microalbuminuria or microproteinuria    Past Surgical History:  Procedure Laterality Date  . REFRACTIVE SURGERY     x9  . WISDOM TOOTH EXTRACTION      FAMILY HISTORY Family History  Problem Relation Age of Onset  . Dementia Paternal Aunt   . Schizophrenia Paternal Aunt   . Cancer Maternal Grandmother   . Diabetes Maternal Grandmother        T2 DM  . Cancer Maternal Grandfather   . Heart disease Father   . Thyroid disease Neg Hx     SOCIAL HISTORY Social History   Tobacco Use  . Smoking status: Never Smoker  . Smokeless tobacco:  Never Used  Vaping Use  . Vaping Use: Never used  Substance Use Topics  . Alcohol use: No  . Drug use: No         OPHTHALMIC EXAM:  Base Eye Exam    Visual Acuity (ETDRS)      Right Left   Dist cc 20/20 20/20 -2   Correction: Glasses       Tonometry (Tonopen, 8:24 AM)      Right Left   Pressure 10 12       Pupils      Pupils Dark Light Shape React APD   Right PERRL 5 4 Round Brisk None  Left PERRL 5 4 Round Brisk None       Visual Fields (Counting fingers)      Left Right    Full Full       Extraocular Movement      Right Left    Full Full       Neuro/Psych    Oriented x3: Yes   Mood/Affect: Normal       Dilation    Both eyes: 1.0% Mydriacyl, 2.5% Phenylephrine @ 8:24 AM        Slit Lamp and Fundus Exam    External Exam      Right Left   External Normal Normal       Slit Lamp Exam      Right Left   Lids/Lashes Normal Normal   Conjunctiva/Sclera White and quiet White and quiet   Cornea Clear Clear   Anterior Chamber Deep and quiet Deep and quiet   Iris Round and reactive Round and reactive   Lens Trace Nuclear sclerosis Trace Nuclear sclerosis   Anterior Vitreous Normal Normal       Fundus Exam      Right Left   Posterior Vitreous Pre-retinal hemorrhage Normal   Disc Normal Normal   C/D Ratio 0.0 0.0   Macula Microaneurysms, no clinically significant macular edema Microaneurysms, no clinically significant macular edema   Vessels PDR-active , only local along inf temporal macula PDR-quiet   Periphery PRP, no retinal holes or tears, attached PRP, no retinal holes or tears, attached          IMAGING AND PROCEDURES  Imaging and Procedures for 05/28/20  Color Fundus Photography Optos - OU - Both Eyes       Right Eye Progression has worsened. Disc findings include normal observations. Macula : microaneurysms. Vessels : Neovascularization.   Left Eye Progression has been stable. Disc findings include normal observations. Macula :  microaneurysms.   Notes Preretinal hemorrhage, boot-shaped along the inferotemporal arcade OD, with some dispersion into the vitreous cavity. This is local neovascular disease and yet it is too close to the macular region to safely deliver local PRP.    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      ICD-10-CM   1. 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 Vitreous hemorrhage of right eye (HCC)  H43.11   3. Stable treated proliferative diabetic retinopathy of left eye with macular edema determined by examination associated with type 1 diabetes mellitus (HCC)  S28.3151 Color Fundus Photography Optos - OU - Both Eyes   E10.3512   4. Pseudotumor cerebri  G93.2     1. Risk and benefits of surgical intervention will be undertaken and discussed today with the patient.  2. We will need to proceed with vitrectomy to remove the vitreous opacification of halt vitreous hemorrhage from progression.  3.  Ophthalmic Meds Ordered this visit:  Meds ordered this encounter  Medications  . prednisoLONE acetate (PRED FORTE) 1 % ophthalmic suspension    Sig: Place 1 drop into the right eye 4 (four) times daily.    Dispense:  10 mL    Refill:  0  . moxifloxacin (VIGAMOX) 0.5 % ophthalmic solution    Sig: Place 1 drop into both eyes 3 (three) times daily for 7 days.    Dispense:  3 mL    Refill:  0       Return ,, [facility], [address], for OD, schedule vitrectomy, panretinal photocoagulation to remove the vitreous opacification .  There are no Patient Instructions on file for this  visit.   Explained the diagnoses, plan, and follow up with the patient and they expressed understanding.  Patient expressed understanding of the importance of proper follow up care.   Alford Highland Adelaine Roppolo M.D. Diseases & Surgery of the Retina and Vitreous [facility] 05/28/20     Abbreviations: M myopia (nearsighted); A astigmatism; H hyperopia (farsighted); P presbyopia; Mrx spectacle prescription;  CTL contact lenses; OD right eye; OS left eye; OU both eyes  XT exotropia; ET esotropia; PEK punctate epithelial keratitis; PEE punctate epithelial erosions; DES dry eye syndrome; MGD meibomian gland dysfunction; ATs artificial tears; PFAT's preservative free artificial tears; NSC nuclear sclerotic cataract; PSC posterior subcapsular cataract; ERM epi-retinal membrane; PVD posterior vitreous detachment; RD retinal detachment; DM diabetes mellitus; DR diabetic retinopathy; NPDR non-proliferative diabetic retinopathy; PDR proliferative diabetic retinopathy; CSME clinically significant macular edema; DME diabetic macular edema; dbh dot blot hemorrhages; CWS cotton wool spot; POAG primary open angle glaucoma; C/D cup-to-disc ratio; HVF humphrey visual field; GVF goldmann visual field; OCT optical coherence tomography; IOP intraocular pressure; BRVO Branch retinal vein occlusion; CRVO central retinal vein occlusion; CRAO central retinal artery occlusion; BRAO branch retinal artery occlusion; RT retinal tear; SB scleral buckle; PPV pars plana vitrectomy; VH Vitreous hemorrhage; PRP panretinal laser photocoagulation; IVK intravitreal kenalog; VMT vitreomacular traction; MH Macular hole;  NVD neovascularization of the disc; NVE neovascularization elsewhere; AREDS age related eye disease study; ARMD age related macular degeneration; POAG primary open angle glaucoma; EBMD epithelial/anterior basement membrane dystrophy; ACIOL anterior chamber intraocular lens; IOL intraocular lens; PCIOL posterior chamber  intraocular lens; Phaco/IOL phacoemulsification with intraocular lens placement; PRK photorefractive keratectomy; LASIK laser assisted in situ keratomileusis; HTN hypertension; DM diabetes mellitus; COPD chronic obstructive pulmonary disease

## 2020-05-30 ENCOUNTER — Encounter (AMBULATORY_SURGERY_CENTER): Payer: 59 | Admitting: Ophthalmology

## 2020-05-30 DIAGNOSIS — H4311 Vitreous hemorrhage, right eye: Secondary | ICD-10-CM

## 2020-05-30 DIAGNOSIS — E103591 Type 1 diabetes mellitus with proliferative diabetic retinopathy without macular edema, right eye: Secondary | ICD-10-CM | POA: Diagnosis not present

## 2020-05-30 HISTORY — PX: EYE SURGERY: SHX253

## 2020-05-31 ENCOUNTER — Ambulatory Visit (INDEPENDENT_AMBULATORY_CARE_PROVIDER_SITE_OTHER): Payer: 59 | Admitting: Ophthalmology

## 2020-05-31 ENCOUNTER — Encounter (INDEPENDENT_AMBULATORY_CARE_PROVIDER_SITE_OTHER): Payer: Self-pay | Admitting: Ophthalmology

## 2020-05-31 ENCOUNTER — Other Ambulatory Visit: Payer: Self-pay

## 2020-05-31 DIAGNOSIS — E103591 Type 1 diabetes mellitus with proliferative diabetic retinopathy without macular edema, right eye: Secondary | ICD-10-CM

## 2020-05-31 NOTE — Progress Notes (Signed)
05/31/2020     CHIEF COMPLAINT Patient presents for Post-op Follow-up   HISTORY OF PRESENT ILLNESS: [name] is a 39 y.o. male who presents to the clinic today for:   HPI    Post-op Follow-up    In right eye.  Discomfort includes Negative for pain.  Vision is stable.  I, the attending physician,  performed the HPI with the patient and updated documentation appropriately.          Comments    1 Day s\p Vitrectomy OD for Vit Hem  Pt denies any pain.       Last edited by Elyse Jarvislayton, Kriston M on 05/31/2020  9:01 AM. (History)      Referring physician: Teena IraniSpencer, [name], PA-C [address] ,  [place] [id number]  HISTORICAL INFORMATION:   Selected notes from the MEDICAL RECORD NUMBER    Lab Results  Component Value Date   HGBA1C 6.5 (A) 05/14/2020     CURRENT MEDICATIONS: Current Outpatient Medications (Ophthalmic Drugs)  Medication Sig  . moxifloxacin (VIGAMOX) 0.5 % ophthalmic solution Place 1 drop into both eyes 3 (three) times daily for 7 days.  . prednisoLONE acetate (PRED FORTE) 1 % ophthalmic suspension Place 1 drop into the right eye 4 (four) times daily.   No current facility-administered medications for this visit. (Ophthalmic Drugs)   Current Outpatient Medications (Other)  Medication Sig  . acetaminophen (TYLENOL) 500 MG tablet Take 1,000 mg by mouth 2 (two) times daily as needed. (Patient not taking: Reported on 05/14/2020)  . acetaZOLAMIDE (DIAMOX) 500 MG capsule TAKE 2 CAPSULES BY MOUTH DAILY IN THE MORNING AND 1 CAPSULE IN EVENING  . albuterol (PROAIR HFA) 108 (90 Base) MCG/ACT inhaler Inhale 2 puffs into the lungs every 6 (six) hours as needed for wheezing or shortness of breath.  [name]. albuterol (PROAIR HFA) 108 (90 Base) MCG/ACT inhaler Inhale 2 puffs into the lungs every 4 (four) hours as needed for wheezing or shortness of breath.  [name]. albuterol (PROVENTIL) (2.5 MG/3ML) 0.083% nebulizer solution Take 3 mLs (2.5 mg total) by nebulization  every 4 (four) hours as needed for wheezing or shortness of breath.  . ALPRAZolam (XANAX) 0.25 MG tablet Take by mouth.  [name]. amitriptyline (ELAVIL) 50 MG tablet Take 1 tablet (50 mg total) by mouth at bedtime. (Patient not taking: Reported on 05/14/2020)  . ASMANEX HFA 200 MCG/ACT AERO INHALE 2 PUFFS INTO THE LUNGS TWICE DAILY (Patient not taking: Reported on 05/14/2020)  . atorvastatin (LIPITOR) 20 MG tablet Take 20 mg by mouth daily.   [name]. AUVI-Q 0.3 MG/0.3ML SOAJ injection Use as directed for life-threatening allergic reaction.  . budesonide (PULMICORT) 0.5 MG/2ML nebulizer solution Take 2 mLs (0.5 mg total) by nebulization as directed.  . budesonide-formoterol (SYMBICORT) 160-4.5 MCG/ACT inhaler Inhale 2 puffs into the lungs 2 (two) times daily.  [name]. EPINEPHrine (AUVI-Q) 0.3 mg/0.3 mL IJ SOAJ injection Use as directed for severe allergic reactions  . glucagon 1 MG injection Follow package directions for low blood sugar.  [name]. HUMALOG 100 UNIT/ML injection INJECT 300 UNITS UNDER THE SKIN VIA INSULIN PUMP EVERY 48 TO 72 HOURS PER HYPERGLYCEMIA AND DKA PROTOCOLS  . levocetirizine (XYZAL) 5 MG tablet Take 5 mg by mouth daily as needed for allergies.   [name]. losartan (COZAAR) 50 MG tablet TAKE 1 TABLET(50 MG) BY MOUTH DAILY  . methocarbamol (ROBAXIN) 500 MG tablet Take 1 tablet (500 mg total) by mouth 2 (two) times daily.  . montelukast (SINGULAIR) 10  MG tablet Take 1 tablet (10 mg total) by mouth at bedtime.  . mupirocin ointment (BACTROBAN) 2 % mupirocin 2 % topical ointment  APP EXT IEN BID  . NUCALA 100 MG/ML SOAJ INJECT 100MG  SUBCUTANEOUSLY EVERY 30 DAYS (GIVEN AT MD  OFFICE)  . nystatin (MYCOSTATIN) 100000 UNIT/ML suspension Swish and swallow 5 mls daily (Patient taking differently: Use as directed 5 mLs in the mouth or throat daily as needed (for thrush). Swish and swallow 5 mls daily)  . omeprazole (PRILOSEC) 20 MG capsule Take 20 mg by mouth daily as needed (for reflux).   ULTRA test strip  USE TO CHECK BLOOD SUGAR 6 TIMES DAILY AS DIRECTED  . topiramate (TOPAMAX) 50 MG tablet TAKE 1 TABLET(50 MG) BY MOUTH TWICE DAILY   Current Facility-Administered Medications (Other)  Medication Route  . Mepolizumab SOLR 100 mg Subcutaneous   Facility-Administered Medications Ordered in Other Visits (Other)  Medication Route  . gadopentetate dimeglumine (MAGNEVIST) injection 20 mL Intravenous      REVIEW OF SYSTEMS:    ALLERGIES Allergies  Allergen Reactions  . Flonase [Fluticasone] Shortness Of Breath  . Molds & Smuts Other (See Comments)    Aggravate asthma  . Sulfites Itching and Swelling  . Tetracyclines & Related Other (See Comments)    increased intracranial pressure  . White Birch Cough    PAST MEDICAL HISTORY Past Medical History:  Diagnosis Date  . ADHD (attention deficit hyperactivity disorder)   . Asthma   . Autonomic neuropathy due to diabetes (HCC)   . Diabetes (HCC)   . Fatigue   . Goiter   . High blood pressure   . High cholesterol   . Hypercholesterolemia   . Hypertension   . Hypoglycemia associated with diabetes (HCC)   . Obesity   . Tachycardia   . Type 1 diabetes mellitus not at goal [facility])   . Uncontrolled DM with microalbuminuria or microproteinuria    Past Surgical History:  Procedure Laterality Date  . EYE SURGERY Right 05/30/2020   Vitrectomy, Dr. 07/30/2020  . REFRACTIVE SURGERY     x9  . WISDOM TOOTH EXTRACTION      FAMILY HISTORY Family History  Problem Relation Age of Onset  . Dementia Paternal Aunt   . Schizophrenia Paternal Aunt   . Cancer Maternal Grandmother   . Diabetes Maternal Grandmother        T2 DM  . Cancer Maternal Grandfather   . Heart disease Father   . Thyroid disease Neg Hx     SOCIAL HISTORY Social History   Tobacco Use  . Smoking status: Never Smoker  . Smokeless tobacco: Never Used  Vaping Use  . Vaping Use: Never used  Substance Use Topics  . Alcohol use: No  . Drug use: No          OPHTHALMIC EXAM:  Base Eye Exam    Visual Acuity (Snellen - Linear)      Right Left   Dist [place] 20/20 -2 20/25       Tonometry (Tonopen, 9:09 AM)      Right Left   Pressure 14 15       Pupils      Dark Light Shape React   Right 7 7 Round Dilated   Left           Neuro/Psych    Oriented x3: Yes   Mood/Affect: Normal        Slit Lamp and Fundus Exam    External  Exam      Right Left   External Normal Normal       Slit Lamp Exam      Right Left   Lids/Lashes Normal Normal   Conjunctiva/Sclera White and quiet White and quiet   Cornea Clear Clear   Anterior Chamber Deep and quiet Deep and quiet   Iris Round and reactive Round and reactive   Lens Trace Nuclear sclerosis Trace Nuclear sclerosis   Anterior Vitreous Normal Normal       Fundus Exam      Right Left   Posterior Vitreous Vitrectomized, clear view.    Disc Normal    C/D Ratio 0.0    Macula Microaneurysms, no clinically significant macular edema    Vessels PDR-inactive , good laser photocoagulation and diathermized sites of prior neovascularization superior to the nerve    Periphery PRP, no retinal holes or tears, attached           IMAGING AND PROCEDURES  Imaging and Procedures for 05/31/20           ASSESSMENT/PLAN:  Diabetic maculopathy of right eye with proliferative retinopathy determined by examination associated with type 1 diabetes mellitus (HCC) No lifting and bending for 1 week. No water in the eye for 10 days. Do not rub the eye. Wear shield at night for 1-3 days.  Wear your CPAP as normal, if instructed by your doctor.  Continue your topical medications for a total of 3 weeks.  Do not refill your postoperative medications unless instructed.  Restrictions reviewed, patient is to restart his topical medications      ICD-10-CM   1. Diabetic maculopathy of right eye with proliferative retinopathy determined by examination associated with type 1 diabetes mellitus (HCC)  E10.3591      1.  Patient is to restart topical ofloxacin 1 drop right eye 4 times daily prednisolone 1 drop 4 times daily  2.  3.  Ophthalmic Meds Ordered this visit:  No orders of the defined types were placed in this encounter.      Return in about 1 week (around 06/07/2020) for dilate, OD, POST OP, COLOR FP.  Patient Instructions  Patient should call for any profound change or decline in acuity in the right eye.  Directed no heavy lifting bending or straining and no compression or rubbing of the eye    Explained the diagnoses, plan, and follow up with the patient and they expressed understanding.  Patient expressed understanding of the importance of proper follow up care.   Alford Highland Shirely Toren M.D. Diseases & Surgery of the Retina and Vitreous [facility] 05/31/20     Abbreviations: M myopia (nearsighted); A astigmatism; H hyperopia (farsighted); P presbyopia; Mrx spectacle prescription;  CTL contact lenses; OD right eye; OS left eye; OU both eyes  XT exotropia; ET esotropia; PEK punctate epithelial keratitis; PEE punctate epithelial erosions; DES dry eye syndrome; MGD meibomian gland dysfunction; ATs artificial tears; PFAT's preservative free artificial tears; NSC nuclear sclerotic cataract; PSC posterior subcapsular cataract; ERM epi-retinal membrane; PVD posterior vitreous detachment; RD retinal detachment; DM diabetes mellitus; DR diabetic retinopathy; NPDR non-proliferative diabetic retinopathy; PDR proliferative diabetic retinopathy; CSME clinically significant macular edema; DME diabetic macular edema; dbh dot blot hemorrhages; CWS cotton wool spot; POAG primary open angle glaucoma; C/D cup-to-disc ratio; HVF humphrey visual field; GVF goldmann visual field; OCT optical coherence tomography; IOP intraocular pressure; BRVO Branch retinal vein occlusion; CRVO central retinal vein occlusion; CRAO central retinal artery occlusion; BRAO  branch retinal artery occlusion; RT  retinal tear; SB scleral buckle; PPV pars plana vitrectomy; VH Vitreous hemorrhage; PRP panretinal laser photocoagulation; IVK intravitreal kenalog; VMT vitreomacular traction; MH Macular hole;  NVD neovascularization of the disc; NVE neovascularization elsewhere; AREDS age related eye disease study; ARMD age related macular degeneration; POAG primary open angle glaucoma; EBMD epithelial/anterior basement membrane dystrophy; ACIOL anterior chamber intraocular lens; IOL intraocular lens; PCIOL posterior chamber intraocular lens; Phaco/IOL phacoemulsification with intraocular lens placement; PRK photorefractive keratectomy; LASIK laser assisted in situ keratomileusis; HTN hypertension; DM diabetes mellitus; COPD chronic obstructive pulmonary disease

## 2020-05-31 NOTE — Patient Instructions (Signed)
Patient should call for any profound change or decline in acuity in the right eye.  Directed no heavy lifting bending or straining and no compression or rubbing of the eye

## 2020-05-31 NOTE — Assessment & Plan Note (Addendum)
No lifting and bending for 1 week. No water in the eye for 10 days. Do not rub the eye. Wear shield at night for 1-3 days.  Wear your CPAP as normal, if instructed by your doctor.  Continue your topical medications for a total of 3 weeks.  Do not refill your postoperative medications unless instructed.  Restrictions reviewed, patient is to restart his topical medications

## 2020-06-07 ENCOUNTER — Encounter (INDEPENDENT_AMBULATORY_CARE_PROVIDER_SITE_OTHER): Payer: Self-pay | Admitting: Ophthalmology

## 2020-06-07 ENCOUNTER — Other Ambulatory Visit: Payer: Self-pay

## 2020-06-07 ENCOUNTER — Ambulatory Visit (INDEPENDENT_AMBULATORY_CARE_PROVIDER_SITE_OTHER): Payer: 59 | Admitting: Ophthalmology

## 2020-06-07 DIAGNOSIS — E103591 Type 1 diabetes mellitus with proliferative diabetic retinopathy without macular edema, right eye: Secondary | ICD-10-CM | POA: Diagnosis not present

## 2020-06-07 DIAGNOSIS — H4311 Vitreous hemorrhage, right eye: Secondary | ICD-10-CM

## 2020-06-07 NOTE — Assessment & Plan Note (Signed)
Status post vitrectomy 1 week previous

## 2020-06-07 NOTE — Progress Notes (Signed)
06/07/2020     CHIEF COMPLAINT Patient presents for Post-op Follow-up   HISTORY OF PRESENT ILLNESS: [name] is a 39 y.o. male who presents to the clinic today for:   HPI    Post-op Follow-up    In right eye.  Vision is stable.          Comments    1 Week POV OD s/p Vitrevtomy for Vit Heme  Pt reports intermittent "sparkles" OD. Pt denies ocular pain. Pt reports stable VA. Pt sts he is using oflox TID and pred QID OD.       Last edited by Ileana Roup, COA on 06/07/2020  8:51 AM. (History)      Referring physician: [name], PA-C [address] [place],  [place] [address]  HISTORICAL INFORMATION:   Selected notes from the MEDICAL RECORD NUMBER    Lab Results  Component Value Date   HGBA1C 6.5 (A) 05/14/2020     CURRENT MEDICATIONS: Current Outpatient Medications (Ophthalmic Drugs)  Medication Sig   prednisoLONE acetate (PRED FORTE) 1 % ophthalmic suspension Place 1 drop into the right eye 4 (four) times daily.   No current facility-administered medications for this visit. (Ophthalmic Drugs)   Current Outpatient Medications (Other)  Medication Sig   acetaminophen (TYLENOL) 500 MG tablet Take 1,000 mg by mouth 2 (two) times daily as needed. (Patient not taking: Reported on 05/14/2020)   acetaZOLAMIDE (DIAMOX) 500 MG capsule TAKE 2 CAPSULES BY MOUTH DAILY IN THE MORNING AND 1 CAPSULE IN EVENING   albuterol (PROAIR HFA) 108 (90 Base) MCG/ACT inhaler Inhale 2 puffs into the lungs every 6 (six) hours as needed for wheezing or shortness of breath.   albuterol (PROAIR HFA) 108 (90 Base) MCG/ACT inhaler Inhale 2 puffs into the lungs every 4 (four) hours as needed for wheezing or shortness of breath.   albuterol (PROVENTIL) (2.5 MG/3ML) 0.083% nebulizer solution Take 3 mLs (2.5 mg total) by nebulization every 4 (four) hours as needed for wheezing or shortness of breath.   ALPRAZolam (XANAX) 0.25 MG tablet Take by mouth.   amitriptyline  (ELAVIL) 50 MG tablet Take 1 tablet (50 mg total) by mouth at bedtime. (Patient not taking: Reported on 05/14/2020)   ASMANEX HFA 200 MCG/ACT AERO INHALE 2 PUFFS INTO THE LUNGS TWICE DAILY (Patient not taking: Reported on 05/14/2020)   atorvastatin (LIPITOR) 20 MG tablet Take 20 mg by mouth daily.    AUVI-Q 0.3 MG/0.3ML SOAJ injection Use as directed for life-threatening allergic reaction.   budesonide (PULMICORT) 0.5 MG/2ML nebulizer solution Take 2 mLs (0.5 mg total) by nebulization as directed.   budesonide-formoterol (SYMBICORT) 160-4.5 MCG/ACT inhaler Inhale 2 puffs into the lungs 2 (two) times daily.   EPINEPHrine (AUVI-Q) 0.3 mg/0.3 mL IJ SOAJ injection Use as directed for severe allergic reactions   glucagon 1 MG injection Follow package directions for low blood sugar.   HUMALOG 100 UNIT/ML injection INJECT 300 UNITS UNDER THE SKIN VIA INSULIN PUMP EVERY 48 TO 72 HOURS PER HYPERGLYCEMIA AND DKA PROTOCOLS   levocetirizine (XYZAL) 5 MG tablet Take 5 mg by mouth daily as needed for allergies.    losartan (COZAAR) 50 MG tablet TAKE 1 TABLET(50 MG) BY MOUTH DAILY   methocarbamol (ROBAXIN) 500 MG tablet Take 1 tablet (500 mg total) by mouth 2 (two) times daily.   montelukast (SINGULAIR) 10 MG tablet Take 1 tablet (10 mg total) by mouth at bedtime.   mupirocin ointment (BACTROBAN) 2 % mupirocin 2 %  topical ointment  APP EXT IEN BID   NUCALA 100 MG/ML SOAJ INJECT 100MG  SUBCUTANEOUSLY EVERY 30 DAYS (GIVEN AT MD  OFFICE)   nystatin (MYCOSTATIN) 100000 UNIT/ML suspension Swish and swallow 5 mls daily (Patient taking differently: Use as directed 5 mLs in the mouth or throat daily as needed (for thrush). Swish and swallow 5 mls daily)   omeprazole (PRILOSEC) 20 MG capsule Take 20 mg by mouth daily as needed (for reflux).    ONETOUCH ULTRA test strip USE TO CHECK BLOOD SUGAR 6 TIMES DAILY AS DIRECTED   topiramate (TOPAMAX) 50 MG tablet TAKE 1 TABLET(50 MG) BY MOUTH TWICE DAILY    Current Facility-Administered Medications (Other)  Medication Route   Mepolizumab SOLR 100 mg Subcutaneous   Facility-Administered Medications Ordered in Other Visits (Other)  Medication Route   gadopentetate dimeglumine (MAGNEVIST) injection 20 mL Intravenous      REVIEW OF SYSTEMS:    ALLERGIES Allergies  Allergen Reactions   Flonase [Fluticasone] Shortness Of Breath   Molds & Smuts Other (See Comments)    Aggravate asthma   Sulfites Itching and Swelling   Tetracyclines & Related Other (See Comments)    increased intracranial pressure   White Birch Cough    PAST MEDICAL HISTORY Past Medical History:  Diagnosis Date   ADHD (attention deficit hyperactivity disorder)    Asthma    Autonomic neuropathy due to diabetes (HCC)    Diabetes (HCC)    Fatigue    Goiter    High blood pressure    High cholesterol    Hypercholesterolemia    Hypertension    Hypoglycemia associated with diabetes (HCC)    Obesity    Tachycardia    Type 1 diabetes mellitus not at goal [facility])    Uncontrolled DM with microalbuminuria or microproteinuria    Past Surgical History:  Procedure Laterality Date   EYE SURGERY Right 05/30/2020   Vitrectomy, Dr. 07/30/2020   REFRACTIVE SURGERY     x9   WISDOM TOOTH EXTRACTION      FAMILY HISTORY Family History  Problem Relation Age of Onset   Dementia Paternal Aunt    Schizophrenia Paternal Aunt    Cancer Maternal Grandmother    Diabetes Maternal Grandmother        T2 DM   Cancer Maternal Grandfather    Heart disease Father    Thyroid disease Neg Hx     SOCIAL HISTORY Social History   Tobacco Use   Smoking status: Never Smoker   Smokeless tobacco: Never Used  Vaping Use   Vaping Use: Never used  Substance Use Topics   Alcohol use: No   Drug use: No         OPHTHALMIC EXAM:  Base Eye Exam    Visual Acuity (ETDRS)      Right Left   Dist cc 20/20 -2 20/30 +2   Dist ph cc  20/20 -1    Correction: Glasses       Tonometry (Tonopen, 8:56 AM)      Right Left   Pressure 13 11       Pupils      Pupils Dark Light Shape React APD   Right PERRL 5 4 Round Brisk None   Left PERRL 5 4 Round Brisk None       Visual Fields (Counting fingers)      Left Right    Full Full       Extraocular Movement      Right Left  Full Full       Neuro/Psych    Oriented x3: Yes   Mood/Affect: Normal       Dilation    Right eye: 1.0% Mydriacyl, 2.5% Phenylephrine @ 8:56 AM        Slit Lamp and Fundus Exam    External Exam      Right Left   External Normal Normal       Slit Lamp Exam      Right Left   Lids/Lashes Normal Normal   Conjunctiva/Sclera White and quiet White and quiet   Cornea Clear Clear   Anterior Chamber Deep and quiet Deep and quiet   Iris Round and reactive Round and reactive   Lens Trace Nuclear sclerosis Trace Nuclear sclerosis   Anterior Vitreous Normal Normal       Fundus Exam      Right Left   Posterior Vitreous Vitrectomized, clear view.    Disc Normal    C/D Ratio 0.0    Macula Microaneurysms, no clinically significant macular edema    Vessels PDR-inactive , good laser photocoagulation and diathermized sites of prior neovascularization superior to the nerve    Periphery PRP, no retinal holes or tears, attached           IMAGING AND PROCEDURES  Imaging and Procedures for 06/07/20  Color Fundus Photography Optos - OU - Both Eyes       Right Eye Progression has improved. Disc findings include normal observations. Macula : normal observations. Periphery : normal observations.   Left Eye Progression has improved.   Notes PDR, quiescent OD, vitreous opacification and preretinal hemorrhage resolved 1 week status post vitrectomy looks great                ASSESSMENT/PLAN:  Diabetic maculopathy of right eye with proliferative retinopathy determined by examination associated with type 1 diabetes mellitus (HCC) Edition  quiescent status post vitrectomy and local PRP  Vitreous hemorrhage of right eye (HCC) Status post vitrectomy 1 week previous      ICD-10-CM   1. Diabetic maculopathy of right eye with proliferative retinopathy determined by examination associated with type 1 diabetes mellitus (HCC)  E10.3591 Color Fundus Photography Optos - OU - Both Eyes  2. Vitreous hemorrhage of right eye (HCC)  H43.11     1.  Looks great OD, condition stabilized 2.  3.  Ophthalmic Meds Ordered this visit:  No orders of the defined types were placed in this encounter.      Return in about 3 months (around 09/07/2020) for DILATE OU.  Patient Instructions  Patient instructed to complete topical medications of the right eye in 2 weeks, do not refill medication bottles.  Not use medication beyond 2 weeks.    Explained the diagnoses, plan, and follow up with the patient and they expressed understanding.  Patient expressed understanding of the importance of proper follow up care.   Alford HighlandGary A. Welford Christmas M.D. Diseases & Surgery of the Retina and Vitreous [facility] 06/07/20     Abbreviations: M myopia (nearsighted); A astigmatism; H hyperopia (farsighted); P presbyopia; Mrx spectacle prescription;  CTL contact lenses; OD right eye; OS left eye; OU both eyes  XT exotropia; ET esotropia; PEK punctate epithelial keratitis; PEE punctate epithelial erosions; DES dry eye syndrome; MGD meibomian gland dysfunction; ATs artificial tears; PFAT's preservative free artificial tears; NSC nuclear sclerotic cataract; PSC posterior subcapsular cataract; ERM epi-retinal membrane; PVD posterior vitreous detachment; RD retinal detachment; DM diabetes mellitus; DR  diabetic retinopathy; NPDR non-proliferative diabetic retinopathy; PDR proliferative diabetic retinopathy; CSME clinically significant macular edema; DME diabetic macular edema; dbh dot blot hemorrhages; CWS cotton wool spot; POAG primary open angle glaucoma;  C/D cup-to-disc ratio; HVF humphrey visual field; GVF goldmann visual field; OCT optical coherence tomography; IOP intraocular pressure; BRVO Branch retinal vein occlusion; CRVO central retinal vein occlusion; CRAO central retinal artery occlusion; BRAO branch retinal artery occlusion; RT retinal tear; SB scleral buckle; PPV pars plana vitrectomy; VH Vitreous hemorrhage; PRP panretinal laser photocoagulation; IVK intravitreal kenalog; VMT vitreomacular traction; MH Macular hole;  NVD neovascularization of the disc; NVE neovascularization elsewhere; AREDS age related eye disease study; ARMD age related macular degeneration; POAG primary open angle glaucoma; EBMD epithelial/anterior basement membrane dystrophy; ACIOL anterior chamber intraocular lens; IOL intraocular lens; PCIOL posterior chamber intraocular lens; Phaco/IOL phacoemulsification with intraocular lens placement; PRK photorefractive keratectomy; LASIK laser assisted in situ keratomileusis; HTN hypertension; DM diabetes mellitus; COPD chronic obstructive pulmonary disease

## 2020-06-07 NOTE — Assessment & Plan Note (Signed)
Edition quiescent status post vitrectomy and local PRP

## 2020-06-07 NOTE — Patient Instructions (Signed)
Patient instructed to complete topical medications of the right eye in 2 weeks, do not refill medication bottles.  Not use medication beyond 2 weeks.

## 2020-06-10 ENCOUNTER — Other Ambulatory Visit: Payer: 59

## 2020-06-11 ENCOUNTER — Encounter (INDEPENDENT_AMBULATORY_CARE_PROVIDER_SITE_OTHER): Payer: Self-pay

## 2020-06-12 ENCOUNTER — Other Ambulatory Visit: Payer: Self-pay

## 2020-06-12 ENCOUNTER — Ambulatory Visit: Payer: 59 | Admitting: Sports Medicine

## 2020-06-12 VITALS — BP 100/70 | Ht 72.0 in | Wt 230.0 lb

## 2020-06-12 DIAGNOSIS — M25511 Pain in right shoulder: Secondary | ICD-10-CM | POA: Diagnosis not present

## 2020-06-12 DIAGNOSIS — M25512 Pain in left shoulder: Secondary | ICD-10-CM

## 2020-06-12 MED ORDER — DULOXETINE HCL 30 MG PO CPEP: 30.0000 mg | ORAL_CAPSULE | Freq: Every day | ORAL | 1 refills | Status: DC

## 2020-06-12 NOTE — Patient Instructions (Addendum)
It was great to see you today! Thank you for letting me participate in your care!  Today, we discussed your continued neck and arm pain. We are encouraged that the MRI was normal. That is a good thing! We think that going forward starting the medication we have prescribed for you called Cymbalta will help. More importantly, we want to see your sleep pattern improve as we think your muscles are never fully recovering like they need to. We also encourage you to continue to see your counselor for help with depression and anxiety.  Be well, Joseph Schick, DO PGY-4, Sports Medicine Fellow [facility]

## 2020-06-12 NOTE — Assessment & Plan Note (Signed)
Given patient MRI of the cervical spine was negative for any acute abnormality this is reassuring.Given his history of depression, anxiety, and poor sleep pattern most likely think that his pain and symptoms are coming from an inability of his muscles and nerves to recover as he is not getting enough sleep.  Also think there is a role for depression as a contributor to his symptoms. - Cymbalta 30 mg once daily we will do this for 1 month.  If he tolerates this well we will increase it to 60 mg once daily to see if we can get a good response and relief of his symptoms. - Instructed patient to work on improving his sleep cycle - Encouraged patient to continue speaking with a counselor for his depression and anxiety; if this is not much better at his follow-up we will push for him to see psychiatry - Follow up with [place] in 4 weeks

## 2020-06-12 NOTE — Progress Notes (Signed)
    SUBJECTIVE:   CHIEF COMPLAINT / HPI:   Bilateral arm pain Joseph LipskiIs a 39 year old male who is an established patient at this practice who presents today for follow-up due to his bilateral arm pain and neck pain.  He states it is not much improved since his last visit and that he still has a burning achy pain that comes from the neck and radiates down all the way down his arms to both hands.  He states anything physical causes pain and that when it occurs it lingers for days.  He tried a muscle relaxer which he says he is unsure if it helped no recent falls or injuries since he was last seen in our clinic.  He states that his sleep pattern is very poor and it is "off and on" and has been that way for the past 30 years.  He endorses only getting a few hours of sleep on average each night.  He also endorses that he "probably" has depression he has been on several medications for this in the past but says he has had a bad reaction to most of these medications including amitriptyline, and Wellbutrin.  He states the amitriptyline that we gave him made him sleepy all day and made his heart race.  He does see a counselor for his depression.Discussed the results of his neck MRI which were negative.  PERTINENT  PMH / PSH: Hypertension, uncontrolled diabetes with complications, GERD, asthma, Depression, anxiety, Pseudotumor cerebri  OBJECTIVE:   BP 100/70   Ht 6' (1.829 m)   Wt 230 lb (104.3 kg)   BMI 31.19 kg/m   Gen: NAD Mood: Patient appears to be in a depressed mood makes poor eye contact and speaks slowly. Neck: No obvious deformity, No spinous process tenderness, Full range of motion in flexion-extension rotation and side bending.  ASSESSMENT/PLAN:   Pain in joint, shoulder region Given patient MRI of the cervical spine was negative for any acute abnormality this is reassuring.Given his history of depression, anxiety, and poor sleep pattern most likely think that his pain and symptoms are  coming from an inability of his muscles and nerves to recover as he is not getting enough sleep.  Also think there is a role for depression as a contributor to his symptoms. - Cymbalta 30 mg once daily we will do this for 1 month.  If he tolerates this well we will increase it to 60 mg once daily to see if we can get a good response and relief of his symptoms. - Instructed patient to work on improving his sleep cycle - Encouraged patient to continue speaking with a counselor for his depression and anxiety; if this is not much better at his follow-up we will push for him to see psychiatry - Follow up with [place] in 4 weeks     Arlyce Harman, DO PGY-4, Sports Medicine Fellow [facility]  I observed and examined the patient with the [facility] Fellows and agree with assessment and plan.  Note reviewed and modified by me. Sterling Big, MD

## 2020-06-14 ENCOUNTER — Ambulatory Visit (INDEPENDENT_AMBULATORY_CARE_PROVIDER_SITE_OTHER): Payer: 59 | Admitting: Ophthalmology

## 2020-06-14 ENCOUNTER — Other Ambulatory Visit: Payer: 59

## 2020-06-14 ENCOUNTER — Other Ambulatory Visit: Payer: Self-pay

## 2020-06-14 ENCOUNTER — Encounter (INDEPENDENT_AMBULATORY_CARE_PROVIDER_SITE_OTHER): Payer: Self-pay | Admitting: Ophthalmology

## 2020-06-14 ENCOUNTER — Telehealth: Payer: Self-pay | Admitting: Neurology

## 2020-06-14 DIAGNOSIS — E103591 Type 1 diabetes mellitus with proliferative diabetic retinopathy without macular edema, right eye: Secondary | ICD-10-CM

## 2020-06-14 DIAGNOSIS — H4311 Vitreous hemorrhage, right eye: Secondary | ICD-10-CM | POA: Diagnosis not present

## 2020-06-14 DIAGNOSIS — Z8669 Personal history of other diseases of the nervous system and sense organs: Secondary | ICD-10-CM

## 2020-06-14 NOTE — Assessment & Plan Note (Signed)
Patient has new complaints of a reading of grayness around the center of his vision.  With a history of pseudotumor cerebri and proliferative diabetic retinopathy he has 2 possible causes.  Retinal vascular diseases #1 secondarily would be residual developing compressive optic neuropathy.  This latter condition with pseudotumor cerebri though usually associated with ongoing or chronic nerve edema leading to atrophy, which neither is clinically present today  Asked patient to return to see Dr. Wallie Renshaw for continuing care no visual field deficits and potentially visual field testing

## 2020-06-14 NOTE — Telephone Encounter (Signed)
Patient's mother stopped by the lobby this morning requesting a emergency appointment for her son. She is not on his DPR. 11:25 Patient arrived to lobby stating Dr. Darrick Penna has sent a request over for him to be seen. He is having eye pressure issues. He is having numbness in his hands and arms.  Best call back [phone]

## 2020-06-14 NOTE — Telephone Encounter (Signed)
Called the patient and a total of 20 min was spent on the phone discussing medical concerns that have been going on for some time now. Patient 2 mths ago developed neck pain and muscle spasms and has been following with Dr Darrick Penna for this. Dr Darrick Penna suggested MRI on neck in which he completed and was negative.  Dr Darrick Penna thinks a lot of these symptoms are coming from potentially not getting enough sleep. Pt states it is only recently that his sleep has been interrupted. Dr Darrick Penna has suggested the patient try taking Cymbalta. Pt did not want to start that without talking with Dr Vickey Huger and addressing this in regards to his other medications that he takes.   2.5 weeks ago the patient suffered a bleed in the eye, Dr Luciana Axe did surgery on the patient. The patient states that the bleed occurred during the night and he is unsure if this was due to anything medically that could be cause of that? His last sleep study was in 2018.   Advised the patient that I would discuss these concerns and advise Dr Dohmeier to review notes from the other Md's informed him that in the meantime she recommends the patient have a NCV completed. I have placed orders for the patient to have that done and scheduled him for that on 9/15. I advised I would call if something opened sooner. Placed him on Dr Dohmeier's schedule after that date as a follow up. Pt wanted it noted that his blood sugar/diabetes is under control.

## 2020-06-14 NOTE — Assessment & Plan Note (Signed)
OD this condition resolved now status post vitrectomy

## 2020-06-14 NOTE — Telephone Encounter (Signed)
cymbalat is OK. We can also check for sleep apnea again- . NCV and EMG ordered already. CD

## 2020-06-14 NOTE — Progress Notes (Signed)
06/14/2020     CHIEF COMPLAINT Patient presents for Post-op Follow-up   HISTORY OF PRESENT ILLNESS: [name] is a 39 y.o. male who presents to the clinic today for:   HPI    Post-op Follow-up    In right eye.  Discomfort includes none.  Vision is blurred at distance and is blurred at near.  I, the attending physician,  performed the HPI with the patient and updated documentation appropriately.          Comments    2 Week s\p OD vitrectomy, panretinal laser OD Pt c/o decreased OD vision. Pt states onset was a few days ago. Denies any pain. Using gtts as directed.  Continues to see a ring while grayness now it should certainly his center of his vision in the right eye.       Last edited by Edmon Crapeankin, Marycarmen Hagey A, MD on 06/14/2020 11:00 AM. (History)      Referring physician: Teena IraniSpencer, [name], PA-C [address] [place],  [place] [id number]  HISTORICAL INFORMATION:   Selected notes from the MEDICAL RECORD NUMBER    Lab Results  Component Value Date   HGBA1C 6.5 (A) 05/14/2020     CURRENT MEDICATIONS: Current Outpatient Medications (Ophthalmic Drugs)  Medication Sig  . prednisoLONE acetate (PRED FORTE) 1 % ophthalmic suspension Place 1 drop into the right eye 4 (four) times daily.   No current facility-administered medications for this visit. (Ophthalmic Drugs)   Current Outpatient Medications (Other)  Medication Sig  . acetaminophen (TYLENOL) 500 MG tablet Take 1,000 mg by mouth 2 (two) times daily as needed. (Patient not taking: Reported on 05/14/2020)  . acetaZOLAMIDE (DIAMOX) 500 MG capsule TAKE 2 CAPSULES BY MOUTH DAILY IN THE MORNING AND 1 CAPSULE IN EVENING  . albuterol (PROAIR HFA) 108 (90 Base) MCG/ACT inhaler Inhale 2 puffs into the lungs every 6 (six) hours as needed for wheezing or shortness of breath.  [name]. albuterol (PROAIR HFA) 108 (90 Base) MCG/ACT inhaler Inhale 2 puffs into the lungs every 4 (four) hours as needed for wheezing or shortness of breath.   [name]. albuterol (PROVENTIL) (2.5 MG/3ML) 0.083% nebulizer solution Take 3 mLs (2.5 mg total) by nebulization every 4 (four) hours as needed for wheezing or shortness of breath.  . ALPRAZolam (XANAX) 0.25 MG tablet Take by mouth.  Christie Beckers. ASMANEX HFA 200 MCG/ACT AERO INHALE 2 PUFFS INTO THE LUNGS TWICE DAILY (Patient not taking: Reported on 05/14/2020)  . atorvastatin (LIPITOR) 20 MG tablet Take 20 mg by mouth daily.   [name]. AUVI-Q 0.3 MG/0.3ML SOAJ injection Use as directed for life-threatening allergic reaction.  . budesonide (PULMICORT) 0.5 MG/2ML nebulizer solution Take 2 mLs (0.5 mg total) by nebulization as directed.  . budesonide-formoterol (SYMBICORT) 160-4.5 MCG/ACT inhaler Inhale 2 puffs into the lungs 2 (two) times daily.  . DULoxetine (CYMBALTA) 30 MG capsule Take 1 capsule (30 mg total) by mouth daily.  [name]. EPINEPHrine (AUVI-Q) 0.3 mg/0.3 mL IJ SOAJ injection Use as directed for severe allergic reactions  . glucagon 1 MG injection Follow package directions for low blood sugar.  [name]. HUMALOG 100 UNIT/ML injection INJECT 300 UNITS UNDER THE SKIN VIA INSULIN PUMP EVERY 48 TO 72 HOURS PER HYPERGLYCEMIA AND DKA PROTOCOLS  . levocetirizine (XYZAL) 5 MG tablet Take 5 mg by mouth daily as needed for allergies.   [name]. losartan (COZAAR) 50 MG tablet TAKE 1 TABLET(50 MG) BY MOUTH DAILY  . methocarbamol (ROBAXIN) 500 MG tablet Take 1 tablet (500  mg total) by mouth 2 (two) times daily.  . montelukast (SINGULAIR) 10 MG tablet Take 1 tablet (10 mg total) by mouth at bedtime.  . mupirocin ointment (BACTROBAN) 2 % mupirocin 2 % topical ointment  APP EXT IEN BID  . NUCALA 100 MG/ML SOAJ INJECT 100MG  SUBCUTANEOUSLY EVERY 30 DAYS (GIVEN AT MD  OFFICE)  . nystatin (MYCOSTATIN) 100000 UNIT/ML suspension Swish and swallow 5 mls daily (Patient taking differently: Use as directed 5 mLs in the mouth or throat daily as needed (for thrush). Swish and swallow 5 mls daily)  . omeprazole (PRILOSEC) 20 MG capsule Take 20 mg by mouth  daily as needed (for reflux).   ULTRA test strip USE TO CHECK BLOOD SUGAR 6 TIMES DAILY AS DIRECTED  . topiramate (TOPAMAX) 50 MG tablet TAKE 1 TABLET(50 MG) BY MOUTH TWICE DAILY   Current Facility-Administered Medications (Other)  Medication Route  . Mepolizumab SOLR 100 mg Subcutaneous   Facility-Administered Medications Ordered in Other Visits (Other)  Medication Route  . gadopentetate dimeglumine (MAGNEVIST) injection 20 mL Intravenous      REVIEW OF SYSTEMS:    ALLERGIES Allergies  Allergen Reactions  . Flonase [Fluticasone] Shortness Of Breath  . Molds & Smuts Other (See Comments)    Aggravate asthma  . Sulfites Itching and Swelling  . Tetracyclines & Related Other (See Comments)    increased intracranial pressure  . [place] Cough    PAST MEDICAL HISTORY Past Medical History:  Diagnosis Date  . ADHD (attention deficit hyperactivity disorder)   . Asthma   . Autonomic neuropathy due to diabetes (HCC)   . Diabetes (HCC)   . Fatigue   . Goiter   . High blood pressure   . High cholesterol   . Hypercholesterolemia   . Hypertension   . Hypoglycemia associated with diabetes (HCC)   . Obesity   . Tachycardia   . Type 1 diabetes mellitus not at goal [facility])   . Uncontrolled DM with microalbuminuria or microproteinuria    Past Surgical History:  Procedure Laterality Date  . EYE SURGERY Right 05/30/2020   Vitrectomy, Dr. 07/30/2020  . REFRACTIVE SURGERY     x9  . WISDOM TOOTH EXTRACTION      FAMILY HISTORY Family History  Problem Relation Age of Onset  . Dementia Paternal Aunt   . Schizophrenia Paternal Aunt   . Cancer Maternal Grandmother   . Diabetes Maternal Grandmother        T2 DM  . Cancer Maternal Grandfather   . Heart disease Father   . Thyroid disease Neg Hx     SOCIAL HISTORY Social History   Tobacco Use  . Smoking status: Never Smoker  . Smokeless tobacco: Never Used  Vaping Use  . Vaping Use: Never used  Substance Use  Topics  . Alcohol use: No  . Drug use: No         OPHTHALMIC EXAM:  Base Eye Exam    Visual Acuity (Snellen - Linear)      Right Left   Dist cc 20/25 20/30 -2       Tonometry (Tonopen, 10:06 AM)      Right Left   Pressure 13 12       Pupils      Dark Light Shape React APD   Right 5 4 Round Brisk None   Left 5 4 Round Brisk None       Neuro/Psych    Oriented x3: Yes   Mood/Affect: Normal  Dilation    Right eye: 1.0% Mydriacyl, 2.5% Phenylephrine @ 10:06 AM        Slit Lamp and Fundus Exam    External Exam      Right Left   External Normal Normal       Slit Lamp Exam      Right Left   Lids/Lashes Normal Normal   Conjunctiva/Sclera White and quiet White and quiet   Cornea Clear Clear   Anterior Chamber Deep and quiet Deep and quiet   Iris Round and reactive Round and reactive   Lens Trace Nuclear sclerosis Trace Nuclear sclerosis   Anterior Vitreous Normal Normal       Fundus Exam      Right Left   Posterior Vitreous Vitrectomized, clear view.    Disc Normal    C/D Ratio 0.0    Macula Microaneurysms, no clinically significant macular edema    Vessels PDR-inactive , good laser photocoagulation and diathermized sites of prior neovascularization superior to the nerve    Periphery PRP, no retinal holes or tears, attached           IMAGING AND PROCEDURES  Imaging and Procedures for 06/14/20  OCT, Retina - OU - Both Eyes       Right Eye Quality was good. Scan locations included subfoveal. Central Foveal Thickness: 326. Progression has improved. Findings include abnormal foveal contour.   Left Eye Quality was good. Scan locations included subfoveal. Central Foveal Thickness: 326. Progression has been stable. Findings include vitreomacular adhesion , normal foveal contour.   Notes Patient has micro-CME inferior portion of the foveal avascular zone, no frank overt CSME   Mild thinning of the superior portion of the macula.  OD                    ASSESSMENT/PLAN:  Diabetic maculopathy of right eye with proliferative retinopathy determined by examination associated with type 1 diabetes mellitus (HCC) This condition has now quiescent status post vitrectomy, membrane peel, removal of preretinal vitreous hemorrhage  H/O visual field defect Patient has new complaints of a reading of grayness around the center of his vision.  With a history of pseudotumor cerebri and proliferative diabetic retinopathy he has 2 possible causes.  Retinal vascular diseases #1 secondarily would be residual developing compressive optic neuropathy.  This latter condition with pseudotumor cerebri though usually associated with ongoing or chronic nerve edema leading to atrophy, which neither is clinically present today  Asked patient to return to see Dr. Wallie Renshaw for continuing care no visual field deficits and potentially visual field testing  Vitreous hemorrhage of right eye (HCC) OD this condition resolved now status post vitrectomy      ICD-10-CM   1. Vitreous hemorrhage of right eye (HCC)  H43.11 OCT, Retina - OU - Both Eyes  2. Diabetic maculopathy of right eye with proliferative retinopathy determined by examination associated with type 1 diabetes mellitus (HCC)  E10.3591   3. H/O visual field defect  Z86.69     1.  To complete 1 more week of topical drops for the right eye then discontinue the use.  Patient should not refill his medications  2.  I do suggest patient schedule appointment Dr. Wallie Renshaw in 1 to 4 weeks for visual field testing  3.  Ophthalmic Meds Ordered this visit:  No orders of the defined types were placed in this encounter.      Return in about 10 weeks (around 08/23/2020) for DILATE OU,  OCT.  There are no Patient Instructions on file for this visit.   Explained the diagnoses, plan, and follow up with the patient and they expressed understanding.  Patient expressed understanding of the importance of  proper follow up care.   Alford Highland Caitrin Pendergraph M.D. Diseases & Surgery of the Retina and Vitreous [facility] 06/14/20     Abbreviations: M myopia (nearsighted); A astigmatism; H hyperopia (farsighted); P presbyopia; Mrx spectacle prescription;  CTL contact lenses; OD right eye; OS left eye; OU both eyes  XT exotropia; ET esotropia; PEK punctate epithelial keratitis; PEE punctate epithelial erosions; DES dry eye syndrome; MGD meibomian gland dysfunction; ATs artificial tears; PFAT's preservative free artificial tears; NSC nuclear sclerotic cataract; PSC posterior subcapsular cataract; ERM epi-retinal membrane; PVD posterior vitreous detachment; RD retinal detachment; DM diabetes mellitus; DR diabetic retinopathy; NPDR non-proliferative diabetic retinopathy; PDR proliferative diabetic retinopathy; CSME clinically significant macular edema; DME diabetic macular edema; dbh dot blot hemorrhages; CWS cotton wool spot; POAG primary open angle glaucoma; C/D cup-to-disc ratio; HVF humphrey visual field; GVF goldmann visual field; OCT optical coherence tomography; IOP intraocular pressure; BRVO Branch retinal vein occlusion; CRVO central retinal vein occlusion; CRAO central retinal artery occlusion; BRAO branch retinal artery occlusion; RT retinal tear; SB scleral buckle; PPV pars plana vitrectomy; VH Vitreous hemorrhage; PRP panretinal laser photocoagulation; IVK intravitreal kenalog; VMT vitreomacular traction; MH Macular hole;  NVD neovascularization of the disc; NVE neovascularization elsewhere; AREDS age related eye disease study; ARMD age related macular degeneration; POAG primary open angle glaucoma; EBMD epithelial/anterior basement membrane dystrophy; ACIOL anterior chamber intraocular lens; IOL intraocular lens; PCIOL posterior chamber intraocular lens; Phaco/IOL phacoemulsification with intraocular lens placement; PRK photorefractive keratectomy; LASIK laser assisted in situ keratomileusis;  HTN hypertension; DM diabetes mellitus; COPD chronic obstructive pulmonary disease

## 2020-06-14 NOTE — Assessment & Plan Note (Signed)
This condition has now quiescent status post vitrectomy, membrane peel, removal of preretinal vitreous hemorrhage

## 2020-06-15 ENCOUNTER — Telehealth: Payer: Self-pay | Admitting: *Deleted

## 2020-06-15 ENCOUNTER — Encounter: Payer: Self-pay | Admitting: Neurology

## 2020-06-15 ENCOUNTER — Other Ambulatory Visit: Payer: Self-pay | Admitting: Neurology

## 2020-06-15 DIAGNOSIS — J4541 Moderate persistent asthma with (acute) exacerbation: Secondary | ICD-10-CM

## 2020-06-15 DIAGNOSIS — J4551 Severe persistent asthma with (acute) exacerbation: Secondary | ICD-10-CM

## 2020-06-15 DIAGNOSIS — G932 Benign intracranial hypertension: Secondary | ICD-10-CM

## 2020-06-15 NOTE — Telephone Encounter (Signed)
Received my chart: I woke up this morning before 6 with my heart pumping wildly and I could see the veins inside my right eye pulsing with my heart beat. My vision has continued to get worse in this eye so I am very worried about this.  My heartbeat slowed down considerably when I got up and the pulsing in eye stopped as soon as hart slowed.  I am not sure what is going on with my heart and blood pressure when I'm asleep or laying down but I am worried for my vision  Called patient who stated his BP is within normal limits now, returned to normal as soon as he got out of bed. He saw eye dr yesterday and will be sent for vision field study. Dr Dohmeier advised he may need overnight pulse Ox study. Advised if he has any further issues with vision he needs to call eye dr, otherwise our office has MD on call on weekends.I also advised him per Dr Vickey Huger he may take Cymbalta.  Patient verbalized understanding, appreciation.

## 2020-06-18 ENCOUNTER — Other Ambulatory Visit: Payer: Self-pay | Admitting: Neurology

## 2020-06-18 ENCOUNTER — Other Ambulatory Visit: Payer: Self-pay | Admitting: Family Medicine

## 2020-06-18 MED ORDER — TOPIRAMATE 50 MG PO TABS: ORAL_TABLET | ORAL | 5 refills | Status: DC

## 2020-06-18 NOTE — Telephone Encounter (Signed)
Per Dr Vickey Huger ONO ordered for the patient and informed patient through [facility]

## 2020-06-24 ENCOUNTER — Encounter (INDEPENDENT_AMBULATORY_CARE_PROVIDER_SITE_OTHER): Payer: Self-pay | Admitting: Ophthalmology

## 2020-06-25 ENCOUNTER — Telehealth: Payer: Self-pay | Admitting: "Endocrinology

## 2020-06-25 ENCOUNTER — Telehealth: Payer: Self-pay | Admitting: Neurology

## 2020-06-25 NOTE — Telephone Encounter (Signed)
1. [name] therapist, Ms. Baltazar Apo, MSN, sent me a text. [name] needs to call me. I told her to have him call me this evening. 2. [name] called. He has had more problems in the past month or so.  A. He had an internal hemorrhage in his right eye about a month ago. Dr. Luciana Axe did a vitrectomy. He saw Dr. Luciana Axe about a week later who told him thinks things looked good. He has been seeing neon-like flashes, sparkles, and streaks ever since then.  He has lost a lot of vision in that eye. He wakes up sometimes and sees his veins or sees a back spider web in that eye. He thinks his eye symptoms are getting worse. He has tried to contact Dr. Luciana Axe, but without success.  B. He has had episodes of dreaming and waking up with a fast heart rate, and a feeling of pulsating blood vessels in his head.   C. He wonders if some of his eye symptoms and dreams are due to his pseudotumor cerebri. He has reached out to Dr. Vickey Huger through her nurse, but without much response. He has not been able to reach Dr. Vickey Huger in 5 weeks.   D. He has been seeing Dr. Darrick Penna.   E. He is also concerned about whether the heart pounding and sensation of pulsating blood vessels he is feeling are related to his pseudotumor or to new heart problem that he may be having while he sleeps.  F. He is also interested in getting [employer] booster. His last dose was in April.  3. I told him that I can do two things for him:  A. I can call Dr. Kathrin Penner, a retinologist,  tomorrow to try to arrange a second opinion for him. .  B. I can reach out to Dr. Vickey Huger and ask her to see [name] to help sort out his signs and symptoms.   C. He said he would greatly appreciate both efforts.  Molli Knock, MD, CDE

## 2020-06-25 NOTE — Telephone Encounter (Signed)
Received the ONO report from [facility] that the patient completed on room air. Will have Dr Dohmeier review the ONO report and call patient with results after she reviews.

## 2020-06-26 ENCOUNTER — Encounter: Payer: Self-pay | Admitting: Neurology

## 2020-06-26 ENCOUNTER — Telehealth: Payer: Self-pay | Admitting: "Endocrinology

## 2020-06-26 NOTE — Telephone Encounter (Signed)
1. I called the office of Dr. Emi Holes, MD, retinologist at the [facility] [place] in [place] at [phone] and spoke with her Diplomatic Services operational officer. I described [name]'s case and his current symptoms. The secretary was gracious enough to fit [name] in tomorrow, 06/27/20 at 2:15 PM. 2. I called [name] and left a voicemail message for him with the information about the appointment. Molli Knock, MD, CDE

## 2020-06-26 NOTE — Telephone Encounter (Signed)
I spoke with Dr Vickey Huger who reviewed the patient's ONO and did not see anything concerning for oxygen level dropping at bedtime. No need for oxygen.

## 2020-06-26 NOTE — Telephone Encounter (Signed)
[facility] Call ID: [id number]

## 2020-06-29 ENCOUNTER — Telehealth (INDEPENDENT_AMBULATORY_CARE_PROVIDER_SITE_OTHER): Payer: Self-pay | Admitting: "Endocrinology

## 2020-06-29 NOTE — Telephone Encounter (Signed)
Who's calling (name and relationship to patient) : Romulo Okray   Best contact number: [phone]  Provider they see: Dr. Fransico Michael  Reason for call: Caller states he wants to give an update to the doctor on his eye appointment. He was told to page doctor  Call ID:  [phone]    PRESCRIPTION REFILL ONLY  Name of prescription:  Pharmacy:

## 2020-06-29 NOTE — Telephone Encounter (Signed)
Message sent to Dr Fransico Michael

## 2020-07-03 ENCOUNTER — Other Ambulatory Visit: Payer: Self-pay

## 2020-07-03 ENCOUNTER — Ambulatory Visit (INDEPENDENT_AMBULATORY_CARE_PROVIDER_SITE_OTHER): Payer: 59 | Admitting: Ophthalmology

## 2020-07-03 ENCOUNTER — Telehealth: Payer: Self-pay | Admitting: Neurology

## 2020-07-03 ENCOUNTER — Encounter (INDEPENDENT_AMBULATORY_CARE_PROVIDER_SITE_OTHER): Payer: Self-pay | Admitting: Ophthalmology

## 2020-07-03 DIAGNOSIS — H43822 Vitreomacular adhesion, left eye: Secondary | ICD-10-CM | POA: Diagnosis not present

## 2020-07-03 DIAGNOSIS — E103591 Type 1 diabetes mellitus with proliferative diabetic retinopathy without macular edema, right eye: Secondary | ICD-10-CM

## 2020-07-03 DIAGNOSIS — H4311 Vitreous hemorrhage, right eye: Secondary | ICD-10-CM

## 2020-07-03 NOTE — Progress Notes (Signed)
07/03/2020     CHIEF COMPLAINT Patient presents for Post-op Follow-up   HISTORY OF PRESENT ILLNESS: [name] is a 39 y.o. male who presents to the clinic today for:   HPI    Post-op Follow-up    In right eye.  Vision is worse.  I, the attending physician,  performed the HPI with the patient and updated documentation appropriately.          Comments    Post op OD OCT  Pt states he has noticed blurry vision OD. Pt states he sees neon green lower left corner of OD vision. Pt states he also sees "neon green veins" in vision. Pt states OD vision is also warped. Pt c/o OD being achy and sore. Pt states sometimes OD and lids shake. Pt states all of this has become much worse since last visit. Pt is no longer using gtts.  Seen by Dr. Petra Kuba, retina service over at [facility] last week whereby no explanation for his current symptomatology in the right eye was given.  That he might have a chance at a neurological referral and in network there and discovered that they were unable to make such a referral.  They did in fact determined he may have new onset neovascularization or small recurrent neovascularization of the left eye by patient report.  For that reason he was given intravitreal Avastin in the left eye.       Last edited by Edmon Crape, MD on 07/03/2020  1:22 PM. (History)      Referring physician: [name], PA-C [address] [place],  [place] [address]  HISTORICAL INFORMATION:   Selected notes from the MEDICAL RECORD NUMBER    Lab Results  Component Value Date   HGBA1C 6.5 (A) 05/14/2020     CURRENT MEDICATIONS: Current Outpatient Medications (Ophthalmic Drugs)  Medication Sig  . prednisoLONE acetate (PRED FORTE) 1 % ophthalmic suspension Place 1 drop into the right eye 4 (four) times daily.   No current facility-administered medications for this visit. (Ophthalmic Drugs)   Current Outpatient Medications (Other)  Medication Sig  . acetaminophen  (TYLENOL) 500 MG tablet Take 1,000 mg by mouth 2 (two) times daily as needed. (Patient not taking: Reported on 05/14/2020)  . acetaZOLAMIDE (DIAMOX) 500 MG capsule TAKE 2 CAPSULES BY MOUTH DAILY IN THE MORNING AND 1 CAPSULE IN EVENING  . albuterol (PROAIR HFA) 108 (90 Base) MCG/ACT inhaler Inhale 2 puffs into the lungs every 6 (six) hours as needed for wheezing or shortness of breath.  [name] albuterol (PROAIR HFA) 108 (90 Base) MCG/ACT inhaler Inhale 2 puffs into the lungs every 4 (four) hours as needed for wheezing or shortness of breath.  [name] albuterol (PROVENTIL) (2.5 MG/3ML) 0.083% nebulizer solution Take 3 mLs (2.5 mg total) by nebulization every 4 (four) hours as needed for wheezing or shortness of breath.  . ALPRAZolam (XANAX) 0.25 MG tablet Take by mouth.  Christie Beckers HFA 200 MCG/ACT AERO INHALE 2 PUFFS INTO THE LUNGS TWICE DAILY (Patient not taking: Reported on 05/14/2020)  . atorvastatin (LIPITOR) 20 MG tablet Take 20 mg by mouth daily.   [name] AUVI-Q 0.3 MG/0.3ML SOAJ injection Use as directed for life-threatening allergic reaction.  . budesonide (PULMICORT) 0.5 MG/2ML nebulizer solution Take 2 mLs (0.5 mg total) by nebulization as directed.  . budesonide-formoterol (SYMBICORT) 160-4.5 MCG/ACT inhaler Inhale 2 puffs into the lungs 2 (two) times daily.  . DULoxetine (CYMBALTA) 30 MG capsule Take 1 capsule (30 mg  total) by mouth daily.  [name]. EPINEPHrine (AUVI-Q) 0.3 mg/0.3 mL IJ SOAJ injection Use as directed for severe allergic reactions  . glucagon 1 MG injection Follow package directions for low blood sugar.  [name]. HUMALOG 100 UNIT/ML injection INJECT 300 UNITS UNDER THE SKIN VIA INSULIN PUMP EVERY 48 TO 72 HOURS PER HYPERGLYCEMIA AND DKA PROTOCOLS  . levocetirizine (XYZAL) 5 MG tablet Take 5 mg by mouth daily as needed for allergies.   [name]. losartan (COZAAR) 50 MG tablet TAKE 1 TABLET(50 MG) BY MOUTH DAILY  . methocarbamol (ROBAXIN) 500 MG tablet Take 1 tablet (500 mg total) by mouth 2 (two) times daily.  .  montelukast (SINGULAIR) 10 MG tablet TAKE 1 TABLET(10 MG) BY MOUTH AT BEDTIME  . mupirocin ointment (BACTROBAN) 2 % mupirocin 2 % topical ointment  APP EXT IEN BID  . NUCALA 100 MG/ML SOAJ INJECT 100MG  SUBCUTANEOUSLY EVERY 30 DAYS (GIVEN AT MD  OFFICE)  . nystatin (MYCOSTATIN) 100000 UNIT/ML suspension Swish and swallow 5 mls daily (Patient taking differently: Use as directed 5 mLs in the mouth or throat daily as needed (for thrush). Swish and swallow 5 mls daily)  . omeprazole (PRILOSEC) 20 MG capsule Take 20 mg by mouth daily as needed (for reflux).   [name]. ONETOUCH ULTRA test strip USE TO CHECK BLOOD SUGAR 6 TIMES DAILY AS DIRECTED  . topiramate (TOPAMAX) 50 MG tablet TAKE 1 TABLET(50 MG) BY MOUTH TWICE DAILY   Current Facility-Administered Medications (Other)  Medication Route  . Mepolizumab SOLR 100 mg Subcutaneous   Facility-Administered Medications Ordered in Other Visits (Other)  Medication Route  . gadopentetate dimeglumine (MAGNEVIST) injection 20 mL Intravenous      REVIEW OF SYSTEMS:    ALLERGIES Allergies  Allergen Reactions  . Flonase [Fluticasone] Shortness Of Breath  . Molds & Smuts Other (See Comments)    Aggravate asthma  . Sulfites Itching and Swelling  . Tetracyclines & Related Other (See Comments)    increased intracranial pressure  . [place] Cough    PAST MEDICAL HISTORY Past Medical History:  Diagnosis Date  . ADHD (attention deficit hyperactivity disorder)   . Asthma   . Autonomic neuropathy due to diabetes (HCC)   . Diabetes (HCC)   . Fatigue   . Goiter   . High blood pressure   . High cholesterol   . Hypercholesterolemia   . Hypertension   . Hypoglycemia associated with diabetes (HCC)   . Obesity   . Tachycardia   . Type 1 diabetes mellitus not at goal [facility](HCC)   . Uncontrolled DM with microalbuminuria or microproteinuria    Past Surgical History:  Procedure Laterality Date  . EYE SURGERY Right 05/30/2020   Vitrectomy, Dr. Luciana Axeankin  .  REFRACTIVE SURGERY     x9  . WISDOM TOOTH EXTRACTION      FAMILY HISTORY Family History  Problem Relation Age of Onset  . Dementia Paternal Aunt   . Schizophrenia Paternal Aunt   . Cancer Maternal Grandmother   . Diabetes Maternal Grandmother        T2 DM  . Cancer Maternal Grandfather   . Heart disease Father   . Thyroid disease Neg Hx     SOCIAL HISTORY Social History   Tobacco Use  . Smoking status: Never Smoker  . Smokeless tobacco: Never Used  Vaping Use  . Vaping Use: Never used  Substance Use Topics  . Alcohol use: No  . Drug use: No  OPHTHALMIC EXAM:  Base Eye Exam    Visual Acuity (Snellen - Linear)      Right Left   Dist cc 20/20 -1 20/25   Correction: Glasses       Tonometry (Tonopen, 1:07 PM)      Right Left   Pressure 8 9       Pupils      Pupils Dark Light Shape React APD   Right PERRL 5 4 Round Brisk None   Left PERRL 5 4 Round Brisk None       Neuro/Psych    Oriented x3: Yes   Mood/Affect: Normal       Dilation    Right eye: 1.0% Mydriacyl, 2.5% Phenylephrine @ 1:07 PM        Slit Lamp and Fundus Exam    External Exam      Right Left   External Normal Normal       Slit Lamp Exam      Right Left   Lids/Lashes Normal Normal   Conjunctiva/Sclera White and quiet White and quiet   Cornea Clear Clear   Anterior Chamber Deep and quiet Deep and quiet   Iris Round and reactive Round and reactive   Lens Trace Nuclear sclerosis Trace Nuclear sclerosis   Anterior Vitreous Normal Normal       Fundus Exam      Right Left   Posterior Vitreous vitreous    Disc Normal    C/D Ratio 0.0    Macula Microaneurysms, no clinically significant macular edema    Vessels PDR-inactive , good laser photocoagulation and diathermized sites of prior neovascularization superior to the nerve    Periphery PRP, no retinal holes or tears, attached           IMAGING AND PROCEDURES  Imaging and Procedures for 07/03/20  OCT, Retina - OU -  Both Eyes       Right Eye Quality was good. Scan locations included subfoveal. Central Foveal Thickness: 344. Progression has been stable.   Left Eye Quality was good. Scan locations included subfoveal. Central Foveal Thickness: 330. Progression has improved. Findings include vitreomacular adhesion .   Notes No active maculopathy in either eye.                ASSESSMENT/PLAN:  Vitreomacular adhesion of left eye Vitreomacular traction may cause vision loss from anatomic distortion to the center of the vision, the macula.  If visual function is symptomatic or threatened, therapy may be needed.  Surgical intervention offers the highest chance of visual stability and improvement.  Distortion of the macula anatomy may cause splitting of the retinal layers, termed foveomacular retinoschisis, which can cause more permanent vision loss.  Epiretinal membranes may also be associated.  Macular hole may also develop if vitreomacular traction progresses. The minor form of this condition is Vitreomacular adhesion, which is a natural change in the aging process of the eye, which requires observation only.  OS minor by OCT only with no active disease, observe  Diabetic maculopathy of right eye with proliferative retinopathy determined by examination associated with type 1 diabetes mellitus (HCC) Patient has ongoing complaints of seeing colors in fact neon green colors in the right eye.  Explained to patient that this is not a retinal origin and that he needs to continue to seek follow-up with his neurologist.  Is undertaken on his own to try to get another neurologist in another city.  I do recommend a follow-up with a local neurologist  to discuss these issues and what if any role it might be from potential pseudotumor cerebri and its effect on the visual system diabetic retinopathy with clear media in each eye will not afford these types of visual issues from a retinal origin.      ICD-10-CM   1.  Vitreous hemorrhage of right eye (HCC)  H43.11 OCT, Retina - OU - Both Eyes  2. Vitreomacular adhesion of left eye  H43.822   3. Diabetic maculopathy of right eye with proliferative retinopathy determined by examination associated with type 1 diabetes mellitus (HCC)  E10.3591     1.  I have urged patient to follow-up with neurology as his green neon symptoms in the right eye are not retinal in origin.  He has concerns as this could be significant for possible pseudotumor cerebri effect  2.  3.  Ophthalmic Meds Ordered this visit:  No orders of the defined types were placed in this encounter.      Return in about 3 months (around 10/02/2020) for DILATE OU.  Patient Instructions  Patient to continue all his efforts to reach out to neurology.  Patient has forms to me that neurology does not follow-up his phone calls the addresses them    Explained the diagnoses, plan, and follow up with the patient and they expressed understanding.  Patient expressed understanding of the importance of proper follow up care.   Alford Highland Jovanka Westgate M.D. Diseases & Surgery of the Retina and Vitreous [facility] 07/03/20     Abbreviations: M myopia (nearsighted); A astigmatism; H hyperopia (farsighted); P presbyopia; Mrx spectacle prescription;  CTL contact lenses; OD right eye; OS left eye; OU both eyes  XT exotropia; ET esotropia; PEK punctate epithelial keratitis; PEE punctate epithelial erosions; DES dry eye syndrome; MGD meibomian gland dysfunction; ATs artificial tears; PFAT's preservative free artificial tears; NSC nuclear sclerotic cataract; PSC posterior subcapsular cataract; ERM epi-retinal membrane; PVD posterior vitreous detachment; RD retinal detachment; DM diabetes mellitus; DR diabetic retinopathy; NPDR non-proliferative diabetic retinopathy; PDR proliferative diabetic retinopathy; CSME clinically significant macular edema; DME diabetic macular edema; dbh dot blot hemorrhages;  CWS cotton wool spot; POAG primary open angle glaucoma; C/D cup-to-disc ratio; HVF humphrey visual field; GVF goldmann visual field; OCT optical coherence tomography; IOP intraocular pressure; BRVO Branch retinal vein occlusion; CRVO central retinal vein occlusion; CRAO central retinal artery occlusion; BRAO branch retinal artery occlusion; RT retinal tear; SB scleral buckle; PPV pars plana vitrectomy; VH Vitreous hemorrhage; PRP panretinal laser photocoagulation; IVK intravitreal kenalog; VMT vitreomacular traction; MH Macular hole;  NVD neovascularization of the disc; NVE neovascularization elsewhere; AREDS age related eye disease study; ARMD age related macular degeneration; POAG primary open angle glaucoma; EBMD epithelial/anterior basement membrane dystrophy; ACIOL anterior chamber intraocular lens; IOL intraocular lens; PCIOL posterior chamber intraocular lens; Phaco/IOL phacoemulsification with intraocular lens placement; PRK photorefractive keratectomy; LASIK laser assisted in situ keratomileusis; HTN hypertension; DM diabetes mellitus; COPD chronic obstructive pulmonary disease

## 2020-07-03 NOTE — Assessment & Plan Note (Signed)
Patient has ongoing complaints of seeing colors in fact neon green colors in the right eye.  Explained to patient that this is not a retinal origin and that he needs to continue to seek follow-up with his neurologist.  Is undertaken on his own to try to get another neurologist in another city.  I do recommend a follow-up with a local neurologist to discuss these issues and what if any role it might be from potential pseudotumor cerebri and its effect on the visual system diabetic retinopathy with clear media in each eye will not afford these types of visual issues from a retinal origin.

## 2020-07-03 NOTE — Assessment & Plan Note (Signed)
Vitreomacular traction may cause vision loss from anatomic distortion to the center of the vision, the macula.  If visual function is symptomatic or threatened, therapy may be needed.  Surgical intervention offers the highest chance of visual stability and improvement.  Distortion of the macula anatomy may cause splitting of the retinal layers, termed foveomacular retinoschisis, which can cause more permanent vision loss.  Epiretinal membranes may also be associated.  Macular hole may also develop if vitreomacular traction progresses. The minor form of this condition is Vitreomacular adhesion, which is a natural change in the aging process of the eye, which requires observation only.  OS minor by OCT only with no active disease, observe

## 2020-07-03 NOTE — Telephone Encounter (Signed)
As of this moment, there is nothing open sooner. I have placed the patient on wait list in hopes that if something opens up we can have him be seen sooner.

## 2020-07-03 NOTE — Telephone Encounter (Signed)
Pt walked in today stating that he needs to be seen sooner than his appointment in october due to his worsening vision problems. Best call back is [phone]

## 2020-07-03 NOTE — Patient Instructions (Signed)
Patient to continue all his efforts to reach out to neurology.  Patient has forms to me that neurology does not follow-up his phone calls the addresses them

## 2020-07-04 ENCOUNTER — Other Ambulatory Visit: Payer: Self-pay | Admitting: Neurology

## 2020-07-04 ENCOUNTER — Encounter: Payer: Self-pay | Admitting: Neurology

## 2020-07-04 DIAGNOSIS — M62838 Other muscle spasm: Secondary | ICD-10-CM

## 2020-07-04 DIAGNOSIS — M542 Cervicalgia: Secondary | ICD-10-CM

## 2020-07-04 DIAGNOSIS — R200 Anesthesia of skin: Secondary | ICD-10-CM

## 2020-07-05 ENCOUNTER — Encounter: Payer: 59 | Admitting: Diagnostic Neuroimaging

## 2020-07-05 ENCOUNTER — Other Ambulatory Visit: Payer: Self-pay

## 2020-07-05 ENCOUNTER — Ambulatory Visit (INDEPENDENT_AMBULATORY_CARE_PROVIDER_SITE_OTHER): Payer: 59 | Admitting: Diagnostic Neuroimaging

## 2020-07-05 DIAGNOSIS — M542 Cervicalgia: Secondary | ICD-10-CM

## 2020-07-05 DIAGNOSIS — Z0289 Encounter for other administrative examinations: Secondary | ICD-10-CM

## 2020-07-05 DIAGNOSIS — R200 Anesthesia of skin: Secondary | ICD-10-CM

## 2020-07-05 DIAGNOSIS — M62838 Other muscle spasm: Secondary | ICD-10-CM

## 2020-07-08 ENCOUNTER — Other Ambulatory Visit: Payer: Self-pay | Admitting: Neurology

## 2020-07-09 NOTE — Progress Notes (Signed)
A 39 year old male with with upper extremity and shoulder pain and weakness. Juvenile diabetic.   IMPRESSION:   The study demonstrates:  -Mild chronic denervation in left first dorsal interosseous may  represent prior mild ulnar neuropathy. However ulnar nerve  conduction studies are normal at this time.  -No underlying widespread polyneuropathy.    INTERPRETING PHYSICIAN:  Suanne Marker, MD

## 2020-07-09 NOTE — Procedures (Signed)
[facility]  NCS (NERVE CONDUCTION STUDY) WITH EMG (ELECTROMYOGRAPHY) REPORT   STUDY DATE: 07/09/20 PATIENT NAME: [name] DOB: [birth year] MRN: [id number]  ORDERING CLINICIAN: Melvyn Novas, MD   TECHNOLOGIST: Durenda Age ELECTROMYOGRAPHER: Glenford Bayley. Whitley Strycharz, MD  CLINICAL INFORMATION: 39 year old male with with upper extremity pain and weakness.  FINDINGS: NERVE CONDUCTION STUDY:  Bilateral median and ulnar motor responses are normal.  Bilateral median and ulnar sensory responses are normal.  Bilateral median to ulnar transcarpal mixed nerve comparison studies are normal.  Bilateral ulnar F-wave latencies are normal.   NEEDLE ELECTROMYOGRAPHY:  Needle examination of left upper extremity deltoid, biceps, triceps, flexor carpi radialis, flexor carpi ulnaris is normal except for mild chronic denervation in left first dorsal interosseous.   IMPRESSION:   The study demonstrates: -Mild chronic denervation in left first dorsal interosseous may represent prior mild ulnar neuropathy.  However ulnar nerve conduction studies are normal at this time. -No underlying widespread polyneuropathy.   INTERPRETING PHYSICIAN:  Suanne Marker, MD Certified in Neurology, Neurophysiology and Neuroimaging  [facility] [address], [address] [place], [place] [address] [phone]  [facility]    Nerve / Sites Muscle Latency Ref. Amplitude Ref. Rel Amp Segments Distance Velocity Ref. Area    ms ms mV mV %  cm m/s m/s mVms  R Median - APB     Wrist APB 3.9 ?4.4 10.7 ?4.0 100 Wrist - APB 7   45.7     Upper arm APB 8.2  10.5  98.4 Upper arm - Wrist 23 54 ?49 44.5  L Median - APB     Wrist APB 4.4 ?4.4 8.3 ?4.0 100 Wrist - APB 7   36.9     Upper arm APB 8.9  6.2  75 Upper arm - Wrist 22 49 ?49 30.7  R Ulnar - ADM     Wrist ADM 3.1 ?3.3 10.0 ?6.0 100 Wrist - ADM 7   37.8     B.Elbow ADM 7.5  9.5  95.1 B.Elbow - Wrist 22 49 ?49 36.7      A.Elbow ADM 9.6  9.8  103 A.Elbow - B.Elbow 10 49 ?49 37.7  L Ulnar - ADM     Wrist ADM 3.3 ?3.3 10.6 ?6.0 100 Wrist - ADM 7   41.1     B.Elbow ADM 7.8  10.1  95.4 B.Elbow - Wrist 22 49 ?49 40.1     A.Elbow ADM 9.8  10.0  99 A.Elbow - B.Elbow 10 49 ?49 40.5  R Ulnar - FDI     Wrist FDI 3.7 ?4.5 12.5 ?7.0 100 Wrist - FDI 8   36.1     B.Elbow FDI 7.8  13.0  104 B.Elbow - Wrist 22 54 ?49 36.9     A.Elbow FDI 9.8  12.9  99.4 A.Elbow - B.Elbow 10 52 ?49 39.1         A.Elbow - Wrist      L Ulnar - FDI     Wrist FDI 3.9 ?4.5 12.4 ?7.0 100 Wrist - FDI 8   36.9     B.Elbow FDI 8.5  11.7  94.2 B.Elbow - Wrist 22 49 ?49 36.8     A.Elbow FDI 10.5  11.5  98.2 A.Elbow - B.Elbow 10 49 ?49 37.1         A.Elbow - Wrist  SNC    Nerve / Sites Rec. Site Peak Lat Ref.  Amp Ref. Segments Distance Peak Diff Ref.    ms ms V V  cm ms ms  R Median, Ulnar - Transcarpal comparison     Median Palm Wrist 2.3 ?2.2 51 ?35 Median Palm - Wrist 8       Ulnar Palm Wrist 2.3 ?2.2 14 ?12 Ulnar Palm - Wrist 8          Median Palm - Ulnar Palm  0.0 ?0.4  L Median, Ulnar - Transcarpal comparison     Median Palm Wrist 2.3 ?2.2 62 ?35 Median Palm - Wrist 8       Ulnar Palm Wrist 2.4 ?2.2 23 ?12 Ulnar Palm - Wrist 8          Median Palm - Ulnar Palm  -0.1 ?0.4  R Median - Orthodromic (Dig II, Mid palm)     Dig II Wrist 3.3 ?3.4 11 ?10 Dig II - Wrist 13    L Median - Orthodromic (Dig II, Mid palm)     Dig II Wrist 3.4 ?3.4 14 ?10 Dig II - Wrist 13    R Ulnar - Orthodromic, (Dig V, Mid palm)     Dig V Wrist 3.1 ?3.1 5 ?5 Dig V - Wrist 11    L Ulnar - Orthodromic, (Dig V, Mid palm)     Dig V Wrist 3.1 ?3.1 8 ?5 Dig V - Wrist 63                   F  Wave    Nerve F Lat Ref.   ms ms  R Ulnar - ADM 30.2 ?32.0  L Ulnar - ADM 31.4 ?32.0         EMG Summary Table    Spontaneous MUAP Recruitment  Muscle IA Fib PSW Fasc Other Amp Dur. Poly Pattern  L. Deltoid Normal None None None _______ Normal  Normal Normal Normal  L. Biceps brachii Normal None None None _______ Normal Normal Normal Normal  L. Triceps brachii Normal None None None _______ Normal Normal Normal Normal  L. Flexor carpi radialis Normal None None None _______ Normal Normal Normal Normal  L. Flexor carpi ulnaris Normal None None None _______ Normal Normal Normal Normal  L. First dorsal interosseous Normal None None None CRDs Increased Normal Normal Reduced

## 2020-07-10 ENCOUNTER — Ambulatory Visit: Payer: 59 | Admitting: Neurology

## 2020-07-10 ENCOUNTER — Other Ambulatory Visit: Payer: Self-pay

## 2020-07-10 ENCOUNTER — Encounter: Payer: Self-pay | Admitting: Neurology

## 2020-07-10 ENCOUNTER — Telehealth: Payer: Self-pay | Admitting: Neurology

## 2020-07-10 VITALS — BP 126/84 | HR 82 | Ht 72.0 in | Wt 228.0 lb

## 2020-07-10 DIAGNOSIS — H469 Unspecified optic neuritis: Secondary | ICD-10-CM

## 2020-07-10 DIAGNOSIS — H5350 Unspecified color vision deficiencies: Secondary | ICD-10-CM | POA: Diagnosis not present

## 2020-07-10 DIAGNOSIS — G912 (Idiopathic) normal pressure hydrocephalus: Secondary | ICD-10-CM | POA: Diagnosis not present

## 2020-07-10 DIAGNOSIS — E109 Type 1 diabetes mellitus without complications: Secondary | ICD-10-CM

## 2020-07-10 DIAGNOSIS — E103533 Type 1 diabetes mellitus with proliferative diabetic retinopathy with traction retinal detachment not involving the macula, bilateral: Secondary | ICD-10-CM

## 2020-07-10 DIAGNOSIS — G932 Benign intracranial hypertension: Secondary | ICD-10-CM

## 2020-07-10 NOTE — Progress Notes (Signed)
SLEEP MEDICINE CLINIC   Provider:  Melvyn Novas, MD  Primary Care Physician:  [name], Joseph Bruin, MD    Referring Provider: ophthalmologist [name], who is also a retina specialist.   The patient is further followed by endocrinologist Joseph. Molli Knock since he is diabetic type I.   Chief Complaint  Patient presents with  . Follow-up    pt alone rm 10. pt has been having compaints and concerns that have been going on with neck pain and numbness. he has also had vision difficulties in which he is seeing [name] and [name] suggest this could be neurological.   . Other    pt states post surgery the eye is continuing to worsen. he is seeing colors, sparkles, distortion esp in right eye and even when eyes are closed. he felt like he was having events that were occiring in sleep and so he followed up with cardiologist to evaluate. c/o echo on 9/9 and is currently wearning a monitor he turns in on 16th.    07-10-2020; RV after EMG and NCV were"  Normal",- mild chronic interossoeus denervation- no widespread neuropathy.   c spine MRI was normal - arm and shoulder soreness persisted. Vision is main concern- and he has spoken to and seen [name]- who believes this is neurological. We are not meeting today for any sleepiness concern he has endorsed the Epworth sleepiness score at 5.  I record here from Joseph. Joseph Hakim, MD reason for visit was abnormal vision changes the visit took place on 25 May 2020 at 2 PM nuclear sclerosis OU, lattice degeneration of the retinal OD, proliferation of diabetic neuropathy neuropathy OU.  Ocular complications of uncontrolled diabetes type 2 in the past, pseudotumor cerebri history optic papillitis OU, retinal hemorrhages in the past OU.  When the patient woke up in the morning he saw black spots he saw pulsations and noticed these he also reports a circular ring around his visual field with little flashing lights some pulsations  and spots of green vision green tinted visual field.  There are also some gray spots that may reflect areas of previous laser surgery.  Both eyes dilated well that Joseph. [name] had a clear view of the posterior segment and noticed possible mild disc edema on both sides mild hyperemia on the right eye a boat shaped the age the left eye was clear, scattered dot blot hemorrhages no lipid on the right side some preretinal fibrosis on the left side he made a note of panretinal photo coagulation for the right eye scattered left wrist degeneration.  The anterior eyes were intact the concern is a ganglion cell loss superiorly in the right eye this inferior scotoma he will repeat the exam in 6 weeks which would be mid September patent mid-October.  There is a new disc finding that he could compare when he could compare his photographic evidence.  Joseph. [name] evaluation from 8-19 2021 showed microaneurysms but no clinically significant macular edema good laser photocoagulation of the sites of prior neovascularization superior to the nerve.  No retinal holes or tears.  [name] also quoted the observation of continuing to see a ring while grayness now it should certainly reflects his center of his vision in the right eye.  This grayness has been reported now as turning somewhat mean.  This appears to be an otic nerve insult- he sees green, lost red vision- he needs evaluation of the optic nerve - rule out  compression. May need to have new spinal tap.        02-23-2020 Patient was seen in a video conference in 01-2020 and is now back face-to -face.  He reports good controll of diabetes and continued to work through the pandemic.  We need refills for INPH, he is on his last refill. He still has asthmatic issues, but he feels these have been not out of control. He consumes neither chocolate nor caffeine and has not noted tremor.     12-15-2018, I see [name] here today with his mother- he has retro-orbital pain at  times, more often in AM- and he has had many more asthma attacks, culminating in chest pain and presentation to ED-I is supposed to have MAB infusions for allergy control but was too sick to have the regular infusion one in 30 days- Has been too sick to get the weekly immunotherapy.  DM follow up documented fair control of glucose.    Just seen by allergy/ asthma physician [name] on 11-29-2018;  .  I quote: " If it does look like acetazolamide is creating significant problems then he will need to make some tough choices about how to address his pseudotumor cerebri as apparently this condition was threatening his vision.  In retrospect when looking back through his respiratory tract problems they really did increase in intensity and frequency since starting his acetazolamide." We decided to reduce the diamox to daily 250 mg and if asthma is still unabashed , will need to switch to TPM and repeat CSF release.   06-09-2018, I have the pleasure of meeting with [name].  [name] today, he recently had seen Joseph. Alveda [name] dental office and underwent a home sleep test they are which has the same results essentially that he had here in an attended polysomnography from 18 June 2017.  Prior to our test he had a home sleep test at [facility] and was told based on those results that he did have obstructive sleep apnea which I could not confirm.  Neither could Joseph. Alveda [name] home sleep test.  The patient however is a loud snorer and our concern was that the dental device may help him to reduce or treat the snoring.  The dental referral was not based on the presence of obstructive sleep apnea.  He also qualified that the sleep study from Joseph. Alveda [name] office on 24 May 2018 was performed while he had an upper respiratory infection, could not rest and breathes well at baseline and could especially not rest flat and needed several pillows to support him.  He did not have tachybradycardia arrhythmias, he did not  have oxygen desaturations of significance, so we are left with discussing how to treat the snoring. insurance will not pay for snoring based dental device.   [name] is also not excessively daytime sleepy and endorsed the Epworth Sleepiness Scale at 8 points, the  fatigue severity score at 58/ 63  points. He is excessively fatigued and this may relate to his DM type 1. He no longer has symptoms of pseudotumor on diamox, has to drink more not to dehydrate. He used to see neon green while he has optic nerve pressure- red vision was unimpaired. He no longer has the green ray in his visual field.  Much less tinnitus on higher diamox dose. Here for refills.       Interval history from 25 Mar 2018, [name] is here today for follow-up visit, he has tolerated the LP very  well, his opening pressure was elevated he was asked to discontinue the tetracycline medication and instead started on Diamox.  He reports that both headaches and vision have been much improved, sometimes in the morning he still has a hazy greenish tinge to his visual field the dose away once he takes his medication.  Much less frequent has since been observed in the afternoon or evenings.  He has not to take another antibiotic as his urine sample to look clear and his urologist felt that he was not in need of further therapy. Joseph Fransico Michael checked his glucometer readings against his pump, and they tracked well. He plans to travel by plane soon and had additional questions about the ICP and diamox. No longer ptosis on the right - resolved right after LP. He has not returned to the gym- where he noted both ptosis and greenish vision.  He was sick after a trip to [place]- explained by altitude exacerbation.  Sleep apnea    Avoiding tetracycline, retinoid, high dose vit A . Avoid high altitudes without diamox premedication.   use diamox defore you fly .     03-12-2018,   I have the pleasure of seeing Mr. Zymire Turnbo today in a follow-up  visit.  I had seen the patient on 14 May when he presented with very concerning data from his ophthalmologist, retro-orbital pressure was identified, and he was from here referred after physical and neurological exam for an immediate fluoroscopic lumbar puncture.  This was performed at 1 PM at [facility] imaging, no he was [facility] hospital location, a successful nontraumatic spinal tap was obtained, between the second and third lumbar vertebra opening pressure was 28 cmH2O.  Except for an elevated glucose level in the CSF there was no other abnormality, no elevated protein no white blood cells or red cells and all cultures thus far have been negative.  After the spinal tap developed abdominal almost spasms, as well as cramping at the chest wall muscles.  By day 3 post spinal tap he has developed typical spinal tap headaches that arise when he sits up or walks sometimes needing about 10 to 15 minutes.  He feels much better laying flat and laying reclining.  In order to treat the spinal tap headaches I will send him for a blood patch, urinary tract infection was ruled out his urinary sample to clean I was concerned since I discontinued his antibiotics.  I still think that the tetracyclines probably tipped the intracranial pressure.  He saw [name] his retinal specialist yesterday, they were pleased with the results as to his retinal changes, any further procedure from ophthalmology will be pending until gait feels better has less headaches and discomfort.  I also started Diamox at 500 mg twice daily should he developed gastrointestinal side effects from this medication I would like in that case to reduce it to 250 in the morning 250 at night and we can still advance the dose later on after a couple of weeks or so.    03-09-2018, Patient returned for an urgent visit based on vision changes and diabetic changes to the eye, seen by [name].  He developed headaches , changes to green tinged vision, some tremor  and HA are worse when he in bed, better when he rises. Dynamic intracranial pressure changes He gets nauseated, and worse vision changes when moving. He feels a vibration and tension in the neck and pressure behind the right eye more than left, he sleeps on his right.  History of chronic sinusitis, with tinnitus since 10/ 2017 . He tested negative for OSA. He had initially improved in HA, tinnitus and Vertigo. Tremor could not be objectively seen.  He has been a diabetic Type 1, followed by Joseph.  Molli KnockMichael [name], with an insulin pump.  MRI brain and sinus CT in the last 6 month. Seen by ENT and allergy specialist.  His upper respiratory infects have decreased, since allergy treatment.   Topical vancomycin and extensive notes over, the last visit with the patient was on May 9 Thursday 2019, the patient had been treated with Avastin injections into the left and right eye, beginning in August 2018 the last right eye injection was on November 09, 2017.  Patient is currently on doxycycline, Arnuity, Humalog insulin, Klonopin, losartan and montelukast.  Impression was that the patient has controlled diabetes type 1 diabetes mellitus with right eye affected by proliferative retinopathy and macular edema Joseph. Luciana Axeankin documented that he felt this was worsening.  He also stated the same for the left eye also to a slightly lesser degree.  There is also a minor cataract forming in both eyes, there is optic papillitis which has not been graded, ocular pain, and a history of acute sinusitis and retinal hemorrhage he mentions the patient snores and has headaches with valsalva-.  Transient obscuration of vision in both eyes- last  MRI scan was normal at the time/ 2018.   Headaches much worse when bending over, body habitus and doxycycline medication may cause this patient's likely intracranial pressure elevation.  ENT cleared him of sinusitis: CLINICAL DATA:  Chronic sinusitis   08-31-2017, [name] is seen here today for  a follow-up visit after his sleep study from 06/18/2017.  The patient again had such a mild degree of apnea that it does not need to be  treated with any intervention.  His AHI was only 2.6/h not considered pathologically.  His REM sleep AHI was 14.3/h and supine AHI 3.4.  We meet today to also look at his sleep experience since the study.  The patient reports that he still wakes up with trembling in both arms, he has shoulder discomfort which keeps him from sleeping on his side.  However in intervention with CPAP, PLM medication or oxygen is not necessary at this point.  I do not have a good understanding as to why he trembles, and why these trembling spells wake him from sleep.  He has been followed closely for his thyroid function and reports that it has been in the high normal range.  He still reports some palpitations that he experiences at night.  He also reports facial twitching at night. He related the onset to psychiatric medication ( wellbutrin, trazodone). He has year around asthma and allergies.  I have no interventional therapies available.    CONSULT : SLEEP CLINIC:  [name] is a 39 y.o. male , seen here as in a referral/ revisit  from sports medicine and Joseph Fransico MichaelBrennan, ped endocrinologist.  I would like to thank Joseph. Fransico MichaelBrennan for his report notes which helped me today to design further sleep evaluation and treatment for this patient. He also gave me an excellent summary of the past medical problems that [name] has faced. [name] was diagnosed with diabetes at age 39 and has been followed by a pediatric endocrinologist, Joseph. Fransico MichaelBrennan. His blood sugars have been better and more stable on an auto mode Medtronic insulin pump. He also faces problems with obesity, hypertension, hyperlipidemia  and an autonomic neuropathy with tachycardia and gastroparesis. As blood pressure control had improved so did his heart rate and his postprandial bloating. He has been treated for thyroiditis  consistent with an evolving Hashimoto's ultra immune thyroid disease. He was euthyroid again in March 2018 when last checked he had reported fatigue and was diagnosed by a home sleep test last year at " [facility]", was  supposedly diagnosed with obstructive sleep apnea but had not treatment initiated .  He has proliferative retinopathy, attributed to diabetes. Joseph. Fransico Michael also wanted to make sure that current rather newish neuromuscular symptoms beI evaluated -he was especially concerned that this patient is prone to further auto-immune diseases such as central demyelination or multiple sclerosis. Joseph. Darrick Penna in this sport medicine section of [facility] had added in this visit on 04/15/2007 but the patient habits and improvement of nausea, vertigo, after discontinuing eyedrops, trazodone and Wellbutrin and atorvastatin. He had more headaches. He felt jittery and tremors of his right hand and right leg were temporary worse. He describes photophobia. He felt shaking more in the morning l right hand and right leg, an inner jitteriness. It has improved. Vertigo not present today.    Sleep habits are as follows:  " Bedtime between 10 and 12 midnight, rises at 7 AM. He describes his sleep is rather restless, trying to find a comfortable sleep position, but she doesn't necessarily use the bed during the night. He does not report nocturia, and only twice or so in the last month has he woke up with palpitations or clamminess, likely related to hypoglycemia. He is known to snore, and he had a home sleep test at [facility] last year in [facility] and was told that he has obstructive sleep apnea. Treatment was not initiated yet. He wakes up not feeling restored or refreshed, and he has noticed that his jittery illness affecting the right extremities and a feeling of inner trembling of his torso is the worst in the morning hours. It doesn't last that long. He also has a pulsating change of vision when he first  gets up and he suffered from vertigo for much of the last 3 months. No daytime naps, he wouldn't sleep at night if he did.   Sleep medical history and family sleep history:  Dad snoring, mom is an insomniac. He remembers having problems sleeping even in childhood. He has felt fatigued and not restored for several months. Social history: works as Games developer. The patient is single, no children. No tobacco use, seldom alcohol use,  4 months ago he stopped drinking caffeine due to the jitteriness, and headaches with photophobia and a feeling of the head being full could also be relating to sinusitis.    Review of Systems: Out of a complete 14 system review, the patient complains of only the following symptoms, and all other reviewed systems are negative.       Social History   Socioeconomic History  . Marital status: Single    Spouse name: Not on file  . Number of children: Not on file  . Years of education: Not on file  . Highest education level: Not on file  Occupational History  . Not on file  Tobacco Use  . Smoking status: Never Smoker  . Smokeless tobacco: Never Used  Vaping Use  . Vaping Use: Never used  Substance and Sexual Activity  . Alcohol use: No  . Drug use: No  . Sexual activity: Not on file  Other  Topics Concern  . Not on file  Social History Narrative   ** Merged History Encounter **       Social Determinants of Health   Financial Resource Strain:   . Difficulty of Paying Living Expenses: Not on file  Food Insecurity:   . Worried About Programme researcher, broadcasting/film/video in the Last Year: Not on file  . Ran Out of Food in the Last Year: Not on file  Transportation Needs:   . Lack of Transportation (Medical): Not on file  . Lack of Transportation (Non-Medical): Not on file  Physical Activity:   . Days of Exercise per Week: Not on file  . Minutes of Exercise per Session: Not on file  Stress:   . Feeling of Stress : Not on file  Social Connections:   .  Frequency of Communication with Friends and Family: Not on file  . Frequency of Social Gatherings with Friends and Family: Not on file  . Attends Religious Services: Not on file  . Active Member of Clubs or Organizations: Not on file  . Attends Banker Meetings: Not on file  . Marital Status: Not on file  Intimate Partner Violence:   . Fear of Current or Ex-Partner: Not on file  . Emotionally Abused: Not on file  . Physically Abused: Not on file  . Sexually Abused: Not on file    Family History  Problem Relation Age of Onset  . Dementia Paternal Aunt   . Schizophrenia Paternal Aunt   . Cancer Maternal Grandmother   . Diabetes Maternal Grandmother        T2 DM  . Cancer Maternal Grandfather   . Heart disease Father   . Thyroid disease Neg Hx     Past Medical History:  Diagnosis Date  . ADHD (attention deficit hyperactivity disorder)   . Asthma   . Autonomic neuropathy due to diabetes (HCC)   . Diabetes (HCC)   . Fatigue   . Goiter   . High blood pressure   . High cholesterol   . Hypercholesterolemia   . Hypertension   . Hypoglycemia associated with diabetes (HCC)   . Obesity   . Tachycardia   . Type 1 diabetes mellitus not at goal [facility])   . Uncontrolled DM with microalbuminuria or microproteinuria     Past Surgical History:  Procedure Laterality Date  . EYE SURGERY Right 05/30/2020   Vitrectomy, [name]  . REFRACTIVE SURGERY     x9  . WISDOM TOOTH EXTRACTION      Current Outpatient Medications  Medication Sig Dispense Refill  . acetaminophen (TYLENOL) 500 MG tablet Take 1,000 mg by mouth 2 (two) times daily as needed.     [name] acetaZOLAMIDE (DIAMOX) 500 MG capsule TAKE 2 CAPSULES BY MOUTH IN THE MORNING AND 1 IN THE EVENING 90 capsule 0  . albuterol (PROAIR HFA) 108 (90 Base) MCG/ACT inhaler Inhale 2 puffs into the lungs every 6 (six) hours as needed for wheezing or shortness of breath. 1 Inhaler 2  . albuterol (PROAIR HFA) 108 (90 Base) MCG/ACT  inhaler Inhale 2 puffs into the lungs every 4 (four) hours as needed for wheezing or shortness of breath. 18 g 1  . albuterol (PROVENTIL) (2.5 MG/3ML) 0.083% nebulizer solution Take 3 mLs (2.5 mg total) by nebulization every 4 (four) hours as needed for wheezing or shortness of breath. 75 mL 1  . ALPRAZolam (XANAX) 0.25 MG tablet Take by mouth.    Christie Beckers HFA 200  MCG/ACT AERO INHALE 2 PUFFS INTO THE LUNGS TWICE DAILY 13 g 5  . atorvastatin (LIPITOR) 20 MG tablet Take 20 mg by mouth daily.   0  . AUVI-Q 0.3 MG/0.3ML SOAJ injection Use as directed for life-threatening allergic reaction. 2 each 2  . budesonide (PULMICORT) 0.5 MG/2ML nebulizer solution Take 2 mLs (0.5 mg total) by nebulization as directed. 2 mL 3  . budesonide-formoterol (SYMBICORT) 160-4.5 MCG/ACT inhaler Inhale 2 puffs into the lungs 2 (two) times daily. 1 Inhaler 5  . DULoxetine (CYMBALTA) 30 MG capsule Take 1 capsule (30 mg total) by mouth daily. 30 capsule 1  . EPINEPHrine (AUVI-Q) 0.3 mg/0.3 mL IJ SOAJ injection Use as directed for severe allergic reactions 2 each 1  . glucagon 1 MG injection Follow package directions for low blood sugar. 1 each 3  . HUMALOG 100 UNIT/ML injection INJECT 300 UNITS UNDER THE SKIN VIA INSULIN PUMP EVERY 48 TO 72 HOURS PER HYPERGLYCEMIA AND DKA PROTOCOLS 120 mL 5  . levocetirizine (XYZAL) 5 MG tablet Take 5 mg by mouth daily as needed for allergies.     [name] losartan (COZAAR) 50 MG tablet TAKE 1 TABLET(50 MG) BY MOUTH DAILY 90 tablet 1  . methocarbamol (ROBAXIN) 500 MG tablet Take 1 tablet (500 mg total) by mouth 2 (two) times daily. 60 tablet 1  . montelukast (SINGULAIR) 10 MG tablet TAKE 1 TABLET(10 MG) BY MOUTH AT BEDTIME 30 tablet 0  . mupirocin ointment (BACTROBAN) 2 % mupirocin 2 % topical ointment  APP EXT IEN BID    . NUCALA 100 MG/ML SOAJ INJECT 100MG  SUBCUTANEOUSLY EVERY 30 DAYS (GIVEN AT MD  OFFICE) 1 mL 11  . nystatin (MYCOSTATIN) 100000 UNIT/ML suspension Swish and swallow 5 mls daily  (Patient taking differently: Use as directed 5 mLs in the mouth or throat daily as needed (for thrush). Swish and swallow 5 mls daily) 150 mL 2  . omeprazole (PRILOSEC) 20 MG capsule Take 20 mg by mouth daily as needed (for reflux).     [name] ULTRA test strip USE TO CHECK BLOOD SUGAR 6 TIMES DAILY AS DIRECTED 200 strip 5  . prednisoLONE acetate (PRED FORTE) 1 % ophthalmic suspension Place 1 drop into the right eye 4 (four) times daily. 10 mL 0  . topiramate (TOPAMAX) 50 MG tablet TAKE 1 TABLET(50 MG) BY MOUTH TWICE DAILY 60 tablet 5   Current Facility-Administered Medications  Medication Dose Route Frequency Provider Last Rate Last Admin  . Mepolizumab SOLR 100 mg  100 mg Subcutaneous Q28 days Alfonse Spruce, MD   100 mg at 02/16/19 1018   Facility-Administered Medications Ordered in Other Visits  Medication Dose Route Frequency Provider Last Rate Last Admin  . gadopentetate dimeglumine (MAGNEVIST) injection 20 mL  20 mL Intravenous Once PRN Aristides Luckey, Porfirio Mylar, MD        Allergies as of 07/10/2020 - Review Complete 07/10/2020  Allergen Reaction Noted  . Flonase [fluticasone] Shortness Of Breath 06/10/2017  . Molds & smuts Other (See Comments) 03/09/2018  . Sulfites Itching and Swelling 02/04/2018  . Tetracyclines & related Other (See Comments) 03/09/2018  . White birch Cough 04/10/2020    Vitals: BP 126/84   Pulse 82   Ht 6' (1.829 m)   Wt 228 lb (103.4 kg)   BMI 30.92 kg/m  Last Weight:  Wt Readings from Last 1 Encounters:  07/10/20 228 lb (103.4 kg)   WUJ:WJXB mass index is 30.92 kg/m.     Last Height:   Ht  Readings from Last 1 Encounters:  07/10/20 6' (1.829 m)    Physical exam:  General: The patient is awake, alert and appears not in acute distress. The patient is well groomed. Head: Normocephalic, atraumatic. Neck is supple. Mallampati 5-  ,  neck circumference:21. Nasal airflow patent , TMJ is  Not  evident . Retrognathia is not seen. Full facial hair.    Cardiovascular:  Regular rate and rhythm , without  murmurs or carotid bruit, and without distended neck veins. Respiratory:  Voice is hoarse, used inhaler right before visit. He has wheezing and had rhonchi., has been on steroids, antibiotics.  Skin:  Without evidence of edema, or rash Trunk: BMI is elevated. The patient's posture is erect   Neurologic exam : The patient is awake and alert, oriented to place and time.   Memory subjective described as intact.  Attention span & concentration ability appears normal.  Speech is fluent, without dysarthria, dysphonia or aphasia.  Mood and affect are appropriate.  Cranial nerves: Pupils are equal and briskly reactive to light. Pale retinae bilaterally, yellow tinged - has neovascular proliferation in both eyes. Laser surgical scars visible.  Extraocular movements in vertical and horizontal planes  without nystagmus.  Hearing to finger rub intact. Facial sensation intact to fine touch. Facial motor  symmetric and tongue / uvula move midline. Shoulder shrug was symmetrical.  Motor exam:  Normal tone, muscle bulk and symmetric strength in all extremities. He has less tension over the neck, worked with PT.  Sensory:  Fine touch, pinprick normal in feet , but he has less often a feeling of vibration in feet and hands. Coordination: Rapid alternating movements normal without evidence of ataxia, dysmetria or tremor. He is restless NO TREMOR seen here today . Gait and station: Patient walks without assistive device . Strength within normal limits.  Stance is stable and normal.  Turns with 3 steps.   Deep tendon reflexes: in the upper and lower extremities are symmetric and intact. Babinski maneuver response is  downgoing.  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 Now on diamox having more frequent asthma attacks- causality unclear , but time correlated.   Reduction in dose helped to keep asthma manageable and allowed preservation of pressure control. I will order a new spinal tap  .    2) optic nerve degeneration - optic neuropathic changes - rule out compression by MRI , special attention to optic nerve.   3)Refilled diamox at  reduced dose , RV with me in 3-4 weeks.    Melvyn Novas, MD 07/10/2020, 10:07 AM  Certified in Neurology by [employer] Certified in Sleep Medicine by [name] [address] [place], [place] [address]

## 2020-07-10 NOTE — Addendum Note (Signed)
Addended by: Melvyn Novas on: 07/10/2020 10:36 AM   Modules accepted: Orders

## 2020-07-10 NOTE — Telephone Encounter (Signed)
no to the covid questions MR Brain w/wo contrast & MRA Head wo contrast Dr. Vickey Huger [facility] Auth: [id number] & [phone] (exp. 07/10/20 to 08/24/20). Patient is scheduled at [facility] for 07/11/20.  Patient informed me that he has a heart monitor that he is suppose to get off on 07/12/20. But he is not sure if it is MRI safe he states he will call his cardiologist to make sure.if it is not safe we will reschedule his appointment until after the monitor comes off.

## 2020-07-10 NOTE — Patient Instructions (Signed)
Lumbar Puncture °A lumbar puncture, also called a spinal tap, is a procedure that is done to remove a small amount of the fluid that surrounds the brain and spinal cord (cerebrospinal fluid, CSF). The fluid is then examined in the lab. This procedure may be done to: °· Help diagnose various problems, such as meningitis, encephalitis, multiple sclerosis, and other infections. °· Remove fluid and relieve pressure that occurs with certain types of headaches. °· Look for bleeding within the brain and spinal cord areas (central nervous system). °· Place medicine into the spinal fluid. °Tell a health care provider about: °· Any allergies you have. °· All medicines you are taking, including vitamins, herbs, eye drops, creams, and over-the-counter medicines like aspirin or NSAIDs. °· Any problems you or family members have had with anesthetic medicines. °· Any blood disorders you have. °· Any surgeries you have had. °· Any medical conditions you have. °· Whether you are pregnant or may be pregnant. °What are the risks? °Generally, this is a safe procedure. However, problems may occur, including: °· Infection at the insertion site that can spread to the bone or spinal fluid. °· Bleeding. °· Spinal headache. This is a severe headache that occurs when there is a leak of spinal fluid. °· Leakage of CSF. °· Allergic reactions to medicines or dyes. °· Damage to other structures or organs. °What happens before the procedure? °Staying hydrated °Follow instructions from your health care provider about hydration, which may include: °· Up to 2 hours before the procedure - you may continue to drink clear liquids, such as water, clear fruit juice, black coffee, and plain tea. °Eating and drinking restrictions °Follow instructions from your health care provider about eating and drinking. If you will be given a medicine to help you relax (sedative), these instructions may include: °· 8 hours before the procedure - stop eating heavy meals  or foods such as meat, fried foods, or fatty foods. °· 6 hours before the procedure - stop eating light meals or foods, such as toast or cereal. °· 6 hours before the procedure - stop drinking milk or drinks that contain milk. °· 2 hours before the procedure - stop drinking clear liquids. °Medicines °· Ask your health care provider about: °? Changing or stopping your regular medicines. This is especially important if you are taking diabetes medicines or blood thinners. °? Taking medicines such as aspirin and ibuprofen. These medicines can thin your blood. Do not take these medicines before your procedure if your health care provider instructs you not to. °· You may be given antibiotic medicine to help prevent infection. If so, take the antibiotic as told by your health care provider. °General instructions °· You may have a blood sample taken. °· You may be asked to shower with a germ-killing soap. °· Ask your health care provider how your surgical site will be marked or identified. °· Plan to have someone take you home from the hospital or clinic. This is especially important if you will be given a sedative. °· If you will be going home right after the procedure, plan to have someone with you for 24 hours. °What happens during the procedure? ° °· You may lie down on your side with your knees bent, or you may sit with your head resting on a pillow on your lap. °? How you are positioned depends on your age and size. °? You will be positioned so that the spaces between the bones of the spine (vertebrae) are as wide   as possible. This will make it easier to pass the needle into the spinal canal. °· The skin on your lower back (lumbar region) will be cleaned. °· You will be given an injection of medicine to numb your lower back area (local anesthetic). °· You may be given pain medicine or a sedative. °· A small needle will be inserted into your lower back until it enters the space that contains the cerebrospinal fluid.  The needle will not enter the spinal cord. °· Fluid will be collected into tubes. It will be sent to a lab for examination. °· The needle will be withdrawn, and a bandage (dressing) will be placed over the area. °The procedure may vary among health care providers and hospitals. °What happens after the procedure? °· Your blood pressure, heart rate, breathing rate, and blood oxygen level will be monitored until any medicines you were given have worn off. °· You may need to stay lying down for a while. °· You will need to drink plenty of fluids and caffeine to help prevent a headache. °· Do not drive for 24 hours if you received a sedative. °Summary °· A lumbar puncture, also called a spinal tap, is a procedure that is done to remove a small amount of the cerebrospinal fluid, CSF. This may be done to help diagnose a wide variety of conditions. °· Before the procedure, tell your health care provider about all medicines you are taking, including vitamins, herbs, eye drops, creams, and over-the-counter medicines. °· Before the procedure, ask your health care provider about changing or stopping your regular medicines. This is especially important if you are taking blood thinners. °· Your lower back will be numbed with an injection before the needle is placed into your spinal canal. °· After the procedure, you will lie down for a while and you will drink plenty of fluids. °This information is not intended to replace advice given to you by your health care provider. Make sure you discuss any questions you have with your health care provider. °Document Revised: 11/26/2016 Document Reviewed: 11/26/2016 °Elsevier Patient Education © 2020 Elsevier Inc. ° °

## 2020-07-11 ENCOUNTER — Ambulatory Visit: Payer: 59

## 2020-07-11 ENCOUNTER — Encounter: Payer: 59 | Admitting: Neurology

## 2020-07-11 ENCOUNTER — Other Ambulatory Visit: Payer: 59

## 2020-07-11 DIAGNOSIS — E103533 Type 1 diabetes mellitus with proliferative diabetic retinopathy with traction retinal detachment not involving the macula, bilateral: Secondary | ICD-10-CM

## 2020-07-11 DIAGNOSIS — G932 Benign intracranial hypertension: Secondary | ICD-10-CM

## 2020-07-11 DIAGNOSIS — G912 (Idiopathic) normal pressure hydrocephalus: Secondary | ICD-10-CM | POA: Diagnosis not present

## 2020-07-11 DIAGNOSIS — H5350 Unspecified color vision deficiencies: Secondary | ICD-10-CM | POA: Diagnosis not present

## 2020-07-11 DIAGNOSIS — E109 Type 1 diabetes mellitus without complications: Secondary | ICD-10-CM

## 2020-07-11 DIAGNOSIS — S04019A Injury of optic nerve, unspecified eye, initial encounter: Secondary | ICD-10-CM

## 2020-07-11 MED ORDER — GADOBENATE DIMEGLUMINE 529 MG/ML IV SOLN
20.0000 mL | Freq: Once | INTRAVENOUS | Status: AC | PRN
  Administered 2020-07-11: 20 mL via INTRAVENOUS

## 2020-07-12 ENCOUNTER — Encounter: Payer: Self-pay | Admitting: Neurology

## 2020-07-12 ENCOUNTER — Ambulatory Visit: Payer: 59 | Admitting: Sports Medicine

## 2020-07-12 ENCOUNTER — Other Ambulatory Visit: Payer: Self-pay | Admitting: Neurology

## 2020-07-12 NOTE — Progress Notes (Signed)
Since the question of cerebral artery stenosis arose from these images, I will order a cerebral angiogram.

## 2020-07-12 NOTE — Progress Notes (Signed)
Normal MRI brain with and without contrast under special attention of the optic nerve.

## 2020-07-13 ENCOUNTER — Other Ambulatory Visit: Payer: Self-pay | Admitting: Student

## 2020-07-13 ENCOUNTER — Ambulatory Visit: Payer: 59 | Admitting: Family Medicine

## 2020-07-16 ENCOUNTER — Encounter (HOSPITAL_COMMUNITY): Payer: Self-pay

## 2020-07-16 ENCOUNTER — Ambulatory Visit (HOSPITAL_COMMUNITY)
Admission: RE | Admit: 2020-07-16 | Discharge: 2020-07-16 | Disposition: A | Discharge status: Home / Self Care | Payer: 59 | Source: Ambulatory Visit | Attending: Neurology | Admitting: Neurology

## 2020-07-16 ENCOUNTER — Other Ambulatory Visit: Payer: Self-pay | Admitting: Neurology

## 2020-07-16 ENCOUNTER — Other Ambulatory Visit: Payer: Self-pay

## 2020-07-16 ENCOUNTER — Other Ambulatory Visit: Payer: 59

## 2020-07-16 DIAGNOSIS — J45909 Unspecified asthma, uncomplicated: Secondary | ICD-10-CM | POA: Diagnosis not present

## 2020-07-16 DIAGNOSIS — E669 Obesity, unspecified: Secondary | ICD-10-CM | POA: Diagnosis not present

## 2020-07-16 DIAGNOSIS — E103533 Type 1 diabetes mellitus with proliferative diabetic retinopathy with traction retinal detachment not involving the macula, bilateral: Secondary | ICD-10-CM

## 2020-07-16 DIAGNOSIS — Z79899 Other long term (current) drug therapy: Secondary | ICD-10-CM | POA: Diagnosis not present

## 2020-07-16 DIAGNOSIS — Z882 Allergy status to sulfonamides status: Secondary | ICD-10-CM | POA: Diagnosis not present

## 2020-07-16 DIAGNOSIS — F909 Attention-deficit hyperactivity disorder, unspecified type: Secondary | ICD-10-CM | POA: Insufficient documentation

## 2020-07-16 DIAGNOSIS — E1040 Type 1 diabetes mellitus with diabetic neuropathy, unspecified: Secondary | ICD-10-CM | POA: Insufficient documentation

## 2020-07-16 DIAGNOSIS — G912 (Idiopathic) normal pressure hydrocephalus: Secondary | ICD-10-CM

## 2020-07-16 DIAGNOSIS — H5350 Unspecified color vision deficiencies: Secondary | ICD-10-CM

## 2020-07-16 DIAGNOSIS — Z794 Long term (current) use of insulin: Secondary | ICD-10-CM | POA: Diagnosis not present

## 2020-07-16 DIAGNOSIS — Z881 Allergy status to other antibiotic agents status: Secondary | ICD-10-CM | POA: Diagnosis not present

## 2020-07-16 DIAGNOSIS — I10 Essential (primary) hypertension: Secondary | ICD-10-CM | POA: Diagnosis not present

## 2020-07-16 DIAGNOSIS — E7800 Pure hypercholesterolemia, unspecified: Secondary | ICD-10-CM | POA: Insufficient documentation

## 2020-07-16 DIAGNOSIS — Z888 Allergy status to other drugs, medicaments and biological substances status: Secondary | ICD-10-CM | POA: Diagnosis not present

## 2020-07-16 DIAGNOSIS — Z6830 Body mass index (BMI) 30.0-30.9, adult: Secondary | ICD-10-CM | POA: Diagnosis not present

## 2020-07-16 DIAGNOSIS — S04019A Injury of optic nerve, unspecified eye, initial encounter: Secondary | ICD-10-CM

## 2020-07-16 DIAGNOSIS — E109 Type 1 diabetes mellitus without complications: Secondary | ICD-10-CM

## 2020-07-16 DIAGNOSIS — I6523 Occlusion and stenosis of bilateral carotid arteries: Secondary | ICD-10-CM | POA: Insufficient documentation

## 2020-07-16 HISTORY — PX: IR US GUIDE VASC ACCESS RIGHT: IMG2390

## 2020-07-16 HISTORY — PX: IR 3D INDEPENDENT WKST: IMG2385

## 2020-07-16 HISTORY — PX: IR ANGIO VERTEBRAL SEL SUBCLAVIAN INNOMINATE UNI L MOD SED: IMG5364

## 2020-07-16 HISTORY — PX: IR ANGIO INTRA EXTRACRAN SEL COM CAROTID INNOMINATE BILAT MOD SED: IMG5360

## 2020-07-16 HISTORY — PX: IR ANGIO VERTEBRAL SEL VERTEBRAL UNI R MOD SED: IMG5368

## 2020-07-16 LAB — BASIC METABOLIC PANEL
Anion gap: 8 (ref 5–15)
BUN: 10 mg/dL (ref 6–20)
CO2: 21 mmol/L — ABNORMAL LOW (ref 22–32)
Calcium: 9.2 mg/dL (ref 8.9–10.3)
Chloride: 112 mmol/L — ABNORMAL HIGH (ref 98–111)
Creatinine, Ser: 0.9 mg/dL (ref 0.61–1.24)
GFR calc Af Amer: >60 mL/min (ref >60)
GFR calc non Af Amer: >60 mL/min (ref >60)
Glucose, Bld: 143 mg/dL — ABNORMAL HIGH (ref 70–99)
Potassium: 3.3 mmol/L — ABNORMAL LOW (ref 3.5–5.1)
Sodium: 141 mmol/L (ref 135–145)

## 2020-07-16 LAB — CBC
HCT: 47.9 % (ref 39.0–52.0)
Hemoglobin: 15.8 g/dL (ref 13.0–17.0)
MCH: 30 pg (ref 26.0–34.0)
MCHC: 33 g/dL (ref 30.0–36.0)
MCV: 90.9 fL (ref 80.0–100.0)
Platelets: 248 10*3/uL (ref 150–400)
RBC: 5.27 MIL/uL (ref 4.22–5.81)
RDW: 12.8 % (ref 11.5–15.5)
WBC: 7.1 10*3/uL (ref 4.0–10.5)
nRBC: 0 % (ref 0.0–0.2)

## 2020-07-16 LAB — PROTIME-INR
INR: 1.1 (ref 0.8–1.2)
Prothrombin Time: 13.5 seconds (ref 11.4–15.2)

## 2020-07-16 LAB — GLUCOSE, CAPILLARY: Glucose-Capillary: 95 mg/dL (ref 70–99)

## 2020-07-16 LAB — APTT: aPTT: 28 seconds (ref 24–36)

## 2020-07-16 MED ORDER — VERAPAMIL HCL 2.5 MG/ML IV SOLN: INTRA_ARTERIAL | Status: AC | PRN

## 2020-07-16 MED ORDER — IOHEXOL 300 MG/ML  SOLN
50.0000 mL | Freq: Once | INTRAMUSCULAR | Status: AC | PRN
  Administered 2020-07-16: 15 mL via INTRA_ARTERIAL

## 2020-07-16 MED ORDER — NITROGLYCERIN 1 MG/10 ML FOR IR/CATH LAB
INTRA_ARTERIAL | Status: AC
  Filled 2020-07-16: qty 10

## 2020-07-16 MED ORDER — VERAPAMIL HCL 2.5 MG/ML IV SOLN
INTRAVENOUS | Status: AC
  Filled 2020-07-16: qty 2

## 2020-07-16 MED ORDER — MIDAZOLAM HCL 2 MG/2ML IJ SOLN
INTRAMUSCULAR | Status: AC | PRN
  Administered 2020-07-16: 1 mg via INTRAVENOUS

## 2020-07-16 MED ORDER — SODIUM CHLORIDE 0.9 % IV BOLUS
INTRAVENOUS | Status: AC | PRN
  Administered 2020-07-16: 250 mL via INTRAVENOUS

## 2020-07-16 MED ORDER — IOHEXOL 300 MG/ML  SOLN
100.0000 mL | Freq: Once | INTRAMUSCULAR | Status: AC | PRN
  Administered 2020-07-16: 25 mL via INTRA_ARTERIAL

## 2020-07-16 MED ORDER — HEPARIN SODIUM (PORCINE) 1000 UNIT/ML IJ SOLN
INTRAMUSCULAR | Status: AC | PRN
  Administered 2020-07-16: 2000 [IU] via INTRA_ARTERIAL

## 2020-07-16 MED ORDER — IOHEXOL 300 MG/ML  SOLN
150.0000 mL | Freq: Once | INTRAMUSCULAR | Status: AC | PRN
  Administered 2020-07-16: 75 mL via INTRA_ARTERIAL

## 2020-07-16 MED ORDER — SODIUM CHLORIDE 0.9 % IV SOLN: INTRAVENOUS | Status: DC

## 2020-07-16 MED ORDER — HEPARIN SODIUM (PORCINE) 1000 UNIT/ML IJ SOLN
INTRAMUSCULAR | Status: AC
  Filled 2020-07-16: qty 1

## 2020-07-16 MED ORDER — ONDANSETRON HCL 4 MG/2ML IJ SOLN
INTRAMUSCULAR | Status: AC
  Filled 2020-07-16: qty 2

## 2020-07-16 MED ORDER — ONDANSETRON HCL 4 MG/2ML IJ SOLN
INTRAMUSCULAR | Status: AC | PRN
  Administered 2020-07-16: 4 mg via INTRAVENOUS

## 2020-07-16 MED ORDER — SODIUM CHLORIDE 0.9 % IV SOLN: INTRAVENOUS | Status: AC

## 2020-07-16 MED ORDER — ONDANSETRON HCL 4 MG/2ML IJ SOLN: 4.0000 mg | Freq: Once | INTRAMUSCULAR | Status: AC

## 2020-07-16 MED ORDER — FENTANYL CITRATE (PF) 100 MCG/2ML IJ SOLN
INTRAMUSCULAR | Status: AC | PRN
  Administered 2020-07-16: 25 ug via INTRAVENOUS

## 2020-07-16 MED ORDER — LIDOCAINE HCL 1 % IJ SOLN
INTRAMUSCULAR | Status: AC
  Filled 2020-07-16: qty 20

## 2020-07-16 MED ORDER — FENTANYL CITRATE (PF) 100 MCG/2ML IJ SOLN
INTRAMUSCULAR | Status: AC
  Filled 2020-07-16: qty 2

## 2020-07-16 MED ORDER — LIDOCAINE HCL 1 % IJ SOLN
INTRAMUSCULAR | Status: AC | PRN
  Administered 2020-07-16: 5 mL

## 2020-07-16 MED ORDER — MIDAZOLAM HCL 2 MG/2ML IJ SOLN
INTRAMUSCULAR | Status: AC
  Filled 2020-07-16: qty 2

## 2020-07-16 MED ORDER — NITROGLYCERIN 1 MG/10 ML FOR IR/CATH LAB
INTRA_ARTERIAL | Status: AC | PRN
  Administered 2020-07-16: 200 ug via INTRA_ARTERIAL

## 2020-07-16 NOTE — Discharge Instructions (Signed)
Keep right arm elevated above level of heart  Radial Site Care  This sheet gives you information about how to care for yourself after your procedure. Your health care provider may also give you more specific instructions. If you have problems or questions, contact your health care provider. What can I expect after the procedure? After the procedure, it is common to have:  Bruising and tenderness at the catheter insertion area. Follow these instructions at home: Medicines  Take over-the-counter and prescription medicines only as told by your health care provider. Insertion site care  Follow instructions from your health care provider about how to take care of your insertion site. Make sure you: ? Wash your hands with soap and water before you change your bandage (dressing). If soap and water are not available, use hand sanitizer. ? Change your dressing as told by your health care provider. ? Leave stitches (sutures), skin glue, or adhesive strips in place. These skin closures may need to stay in place for 2 weeks or longer. If adhesive strip edges start to loosen and curl up, you may trim the loose edges. Do not remove adhesive strips completely unless your health care provider tells you to do that.  Check your insertion site every day for signs of infection. Check for: ? Redness, swelling, or pain. ? Fluid or blood. ? Pus or a bad smell. ? Warmth.  Do not take baths, swim, or use a hot tub until your health care provider approves.  You may shower 24-48 hours after the procedure, or as directed by your health care provider. ? Remove the dressing and gently wash the site with plain soap and water. ? Pat the area dry with a clean towel. ? Do not rub the site. That could cause bleeding.  Do not apply powder or lotion to the site. Activity   For 24 hours after the procedure, or as directed by your health care provider: ? Do not flex or bend the affected arm. ? Do not push or  pull heavy objects with the affected arm. ? Do not drive yourself home from the hospital or clinic. You may drive 24 hours after the procedure unless your health care provider tells you not to. ? Do not operate machinery or power tools.  Do not lift anything that is heavier than 10 lb (4.5 kg), or the limit that you are told, until your health care provider says that it is safe.  Ask your health care provider when it is okay to: ? Return to work or school. ? Resume usual physical activities or sports. ? Resume sexual activity. General instructions  If the catheter site starts to bleed, raise your arm and put firm pressure on the site. If the bleeding does not stop, get help right away. This is a medical emergency.  If you went home on the same day as your procedure, a responsible adult should be with you for the first 24 hours after you arrive home.  Keep all follow-up visits as told by your health care provider. This is important. Contact a health care provider if:  You have a fever.  You have redness, swelling, or yellow drainage around your insertion site. Get help right away if:  You have unusual pain at the radial site.  The catheter insertion area swells very fast.  The insertion area is bleeding, and the bleeding does not stop when you hold steady pressure on the area.  Your arm or hand becomes pale, cool,  tingly, or numb. These symptoms may represent a serious problem that is an emergency. Do not wait to see if the symptoms will go away. Get medical help right away. Call your local emergency services (911 in the [place] Do not drive yourself to the hospital. Summary  After the procedure, it is common to have bruising and tenderness at the site.  Follow instructions from your health care provider about how to take care of your radial site wound. Check the wound every day for signs of infection.  Do not lift anything that is heavier than 10 lb (4.5 kg), or the limit that  you are told, until your health care provider says that it is safe. This information is not intended to replace advice given to you by your health care provider. Make sure you discuss any questions you have with your health care provider. Document Revised: 11/18/2017 Document Reviewed: 11/18/2017 Elsevier Patient Education  2020 Elsevier Inc. Cerebral Angiogram, Care After This sheet gives you information about how to care for yourself after your procedure. Your health care provider may also give you more specific instructions. If you have problems or questions, contact your health care provider. What can I expect after the procedure? After the procedure, it is common to have:  Bruising and tenderness at the catheter insertion site.  A mild headache. Follow these instructions at home: Insertion site care  Follow instructions from your health care provider about how to take care of the insertion site. Make sure you: ? Wash your hands with soap and water before and after you change your bandage (dressing). If soap and water are not available, use hand sanitizer. ? Change your dressing as told by your health care provider.  Do not take baths, swim, or use a hot tub until your health care provider approves. You may shower 24-48 hours after the procedure, or as told by your health care provider.  To clean your insertion site: ? Gently wash the site with plain soap and water. ? Pat the area dry with a clean towel. ? Do not rub the site. This may cause bleeding.  Do not apply powder or lotion to the site. Keep the site clean and dry. Infection signs Check your incision area every day for signs of infection. Check for:  Redness, swelling, or pain.  Fluid or blood.  Warmth.  Pus or a bad smell.  Activity  Do not drive for 24 hours if you were given a sedative during your procedure.  Rest as told by your health care provider.  Do not lift anything that is heavier than 10 lb (4.5  kg), or the limit that you are told, until your health care provider says that it is safe.  Return to your normal activities as told by your health care provider, usually in about a week. Ask your health care provider what activities are safe for you. General instructions   If your insertion site starts to bleed, lie flat and put pressure on the site. If the bleeding does not stop, get help right away. This is a medical emergency.  Do not use any products that contain nicotine or tobacco, such as cigarettes, e-cigarettes, and chewing tobacco. If you need help quitting, ask your health care provider.  Take over-the-counter and prescription medicines only as told by your health care provider.  Drink enough fluid to keep your urine pale yellow. This helps flush the contrast dye from your body.  Keep all follow-up visits as directed by  your health care provider. This is important. Contact a health care provider if:  You have a fever or chills.  You have redness, swelling, or pain around your insertion site.  You have fluid or blood coming from your insertion site.  The insertion site feels warm to the touch.  You have pus or a bad smell coming from your insertion site.  You notice blood collecting in the tissue around the insertion site (hematoma). The hematoma may be painful to the touch. Get help right away if:  You have chest pain or trouble breathing.  You have severe pain or swelling at the insertion site.  The insertion area bleeds, and bleeding continues after 30 minutes of holding steady pressure on the site.  The arm or leg where the catheter was inserted is pale, cold, numb, tingling, or weak.  You have a rash.  You have any symptoms of a stroke. "BE FAST" is an easy way to remember the main warning signs of a stroke: ? B - Balance. Signs are dizziness, sudden trouble walking, or loss of balance. ? E - Eyes. Signs are trouble seeing or a sudden change in vision. ? F -  Face. Signs are sudden weakness or numbness of the face, or the face or eyelid drooping on one side. ? A - Arms. Signs are weakness or numbness in an arm. This happens suddenly and usually on one side of the body. ? S - Speech. Signs are sudden trouble speaking, slurred speech, or trouble understanding what people say. ? T - Time. Time to call emergency services. Write down what time symptoms started.  You have other signs of a stroke, such as: ? A sudden, severe headache with no known cause. ? Nausea or vomiting. ? Seizure. These symptoms may represent a serious problem that is an emergency. Do not wait to see if the symptoms will go away. Get medical help right away. Call your local emergency services (911 in the [place] Do not drive yourself to the hospital. Summary  Bruising and tenderness at the insertion site are common.  Follow your health care provider's instructions about caring for your insertion site. Change dressing and clean the area as instructed.  If your insertion site bleeds, apply direct pressure until bleeding stops.  Return to your normal activities as told by your health care provider. Ask what activities are safe.  Rest and drink plenty of fluids. This information is not intended to replace advice given to you by your health care provider. Make sure you discuss any questions you have with your health care provider. Document Revised: 05/03/2019 Document Reviewed: 05/03/2019 Elsevier Patient Education  2020 Elsevier Inc. Moderate Conscious Sedation, Adult Sedation is the use of medicines to promote relaxation and relieve discomfort and anxiety. Moderate conscious sedation is a type of sedation. Under moderate conscious sedation, you are less alert than normal, but you are still able to respond to instructions, touch, or both. Moderate conscious sedation is used during short medical and dental procedures. It is milder than deep sedation, which is a type of sedation under  which you cannot be easily woken up. It is also milder than general anesthesia, which is the use of medicines to make you unconscious. Moderate conscious sedation allows you to return to your regular activities sooner. Tell a health care provider about:  Any allergies you have.  All medicines you are taking, including vitamins, herbs, eye drops, creams, and over-the-counter medicines.  Use of steroids (by mouth or creams).  Any problems you or family members have had with sedatives and anesthetic medicines.  Any blood disorders you have.  Any surgeries you have had.  Any medical conditions you have, such as sleep apnea.  Whether you are pregnant or may be pregnant.  Any use of cigarettes, alcohol, marijuana, or street drugs. What are the risks? Generally, this is a safe procedure. However, problems may occur, including:  Getting too much medicine (oversedation).  Nausea.  Allergic reaction to medicines.  Trouble breathing. If this happens, a breathing tube may be used to help with breathing. It will be removed when you are awake and breathing on your own.  Heart trouble.  Lung trouble. What happens before the procedure? Staying hydrated Follow instructions from your health care provider about hydration, which may include:  Up to 2 hours before the procedure - you may continue to drink clear liquids, such as water, clear fruit juice, black coffee, and plain tea. Eating and drinking restrictions Follow instructions from your health care provider about eating and drinking, which may include:  8 hours before the procedure - stop eating heavy meals or foods such as meat, fried foods, or fatty foods.  6 hours before the procedure - stop eating light meals or foods, such as toast or cereal.  6 hours before the procedure - stop drinking milk or drinks that contain milk.  2 hours before the procedure - stop drinking clear liquids. Medicine Ask your health care provider  about:  Changing or stopping your regular medicines. This is especially important if you are taking diabetes medicines or blood thinners.  Taking medicines such as aspirin and ibuprofen. These medicines can thin your blood. Do not take these medicines before your procedure if your health care provider instructs you not to.  Tests and exams  You will have a physical exam.  You may have blood tests done to show: ? How well your kidneys and liver are working. ? How well your blood can clot. General instructions  Plan to have someone take you home from the hospital or clinic.  If you will be going home right after the procedure, plan to have someone with you for 24 hours. What happens during the procedure?  An IV tube will be inserted into one of your veins.  Medicine to help you relax (sedative) will be given through the IV tube.  The medical or dental procedure will be performed. What happens after the procedure?  Your blood pressure, heart rate, breathing rate, and blood oxygen level will be monitored often until the medicines you were given have worn off.  Do not drive for 24 hours. This information is not intended to replace advice given to you by your health care provider. Make sure you discuss any questions you have with your health care provider. Document Revised: 09/25/2017 Document Reviewed: 02/02/2016 Elsevier Patient Education  2020 [employer].

## 2020-07-16 NOTE — Sedation Documentation (Signed)
Patient transported to short stay via stretcher. Right wrist assessed at the bedside with [facility]. No bleeding noted at site. TR band in place. +2 pulses radially. Cap refill less than 2. Skin pink,warm and dry. No complaints from patient at this time.

## 2020-07-16 NOTE — Procedures (Signed)
S/P 4 vessel cerebraL ARTERIOGRAMS. Rt RAD APPROACH. Findings. 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 2.Occluded Lt ACA A1 seg. 3.Approx 10 mm x 8.3 mm  High riding RT jugular bulb. 4.Dural venous sinuses patent. S.Suzie Vandam MD

## 2020-07-16 NOTE — H&P (Signed)
to a hypoplastic/aplastic segment which would be a normal variant.  Severe stenosis could have a similar appearance.  The right middle cerebral and anterior cerebral arteries appear normal. In the posterior circulation, the right vertebral artery is dominant.. No stenosis is noted within the vertebral arteries and the basilar arteries. The posterior cerebral arteries appear normal. No aneurysms were identified.   This MR angiogram of the intracranial arteries shows the following: 1.   Moderate stenosis of the M1 segment of the left middle cerebral artery. 2.   Negligible flow through the A1 segment of the left anterior cerebral artery.  This could be due to a hypoplastic/aplastic segment, a normal variant.  This could also be due to severe stenosis.  The left A2 segment derived this flow from the right to through the anterior communicating artery. 3.   No aneurysms are noted. INTERPRETING PHYSICIAN: Richard A. Epimenio Foot, MD, PhD, FAAN Certified in  Neuroimaging by [employer]   MR BRAIN W WO CONTRAST  Result Date: 07/12/2020  [facility] [address] [place], [place] [address] [phone] NEUROIMAGING REPORT STUDY DATE: 07/11/2020 PATIENT NAME: [name] DOB: [birth year] MRN: [id number] EXAM: MRI Brain with and without contrast ORDERING CLINICIAN: Porfirio Mylar Dohmeier MD CLINICAL HISTORY: 39 year old man with visual changes and pseudotumor cerebri syndrome COMPARISON FILMS: CT 12/23/2018 TECHNIQUE: MRI of the brain with and without contrast was obtained utilizing 5 mm axial slices with T1, T2, T2 flair, T2 star gradient echo and diffusion weighted views.  T1 sagittal, T2 coronal and postcontrast views in the axial and coronal plane were obtained. CONTRAST: 20 mL MultiHance IMAGING SITE: [facility], [address] FINDINGS: On sagittal images, the spinal cord is imaged caudally to C3 and is normal in caliber.    The contents of the posterior fossa are of normal size and position.   The pituitary gland and optic chiasm appear normal.    Brain volume appears normal.   The ventricles are normal in size and without distortion.  There are no abnormal extra-axial collections of fluid.  The cerebellum and brainstem appears normal.   The deep gray matter appears normal.  The cerebral hemispheres appear normal.  Diffusion weighted images are normal.  Gradient echo heme weighted images are normal.  The orbits appear normal.   The VIIth/VIIIth nerve complex appears normal.  The mastoid air cells appear normal.  The paranasal sinuses appear normal.  Flow voids are identified within the major intracerebral arteries.   After the infusion of contrast material, a normal enhancement pattern is noted.  No changes compared to the CT dated 12/23/2018   This is a normal MRI of the brain with and without contrast. INTERPRETING PHYSICIAN: Richard A. Epimenio Foot, MD, PhD, FAAN Certified in  Neuroimaging by [employer]  NCV with EMG(electromyography)  Result Date: 07/05/2020 Suanne Marker, MD     07/09/2020  3:04 PM  [facility] NCS (NERVE CONDUCTION STUDY) WITH EMG (ELECTROMYOGRAPHY) REPORT STUDY DATE: 07/09/20 PATIENT NAME: Marq Rebello DOB: 1981/08/07 MRN: [id number] ORDERING CLINICIAN: Melvyn Novas, MD TECHNOLOGIST: Durenda Age ELECTROMYOGRAPHER: Glenford Bayley. Penumalli, MD CLINICAL INFORMATION: 39 year old male with with upper extremity pain and weakness. FINDINGS: NERVE CONDUCTION STUDY: Bilateral median and ulnar motor responses are normal. Bilateral median and ulnar sensory responses are normal. Bilateral median to ulnar transcarpal mixed nerve comparison studies are normal. Bilateral ulnar F-wave latencies are normal. NEEDLE ELECTROMYOGRAPHY: Needle examination of left upper extremity deltoid, biceps, triceps,  to a hypoplastic/aplastic segment which would be a normal variant.  Severe stenosis could have a similar appearance.  The right middle cerebral and anterior cerebral arteries appear normal. In the posterior circulation, the right vertebral artery is dominant.. No stenosis is noted within the vertebral arteries and the basilar arteries. The posterior cerebral arteries appear normal. No aneurysms were identified.   This MR angiogram of the intracranial arteries shows the following: 1.   Moderate stenosis of the M1 segment of the left middle cerebral artery. 2.   Negligible flow through the A1 segment of the left anterior cerebral artery.  This could be due to a hypoplastic/aplastic segment, a normal variant.  This could also be due to severe stenosis.  The left A2 segment derived this flow from the right to through the anterior communicating artery. 3.   No aneurysms are noted. INTERPRETING PHYSICIAN: Richard A. Epimenio Foot, MD, PhD, FAAN Certified in  Neuroimaging by [employer]   MR BRAIN W WO CONTRAST  Result Date: 07/12/2020  [facility] [address] [place], [place] [address] [phone] NEUROIMAGING REPORT STUDY DATE: 07/11/2020 PATIENT NAME: [name] DOB: [birth year] MRN: [id number] EXAM: MRI Brain with and without contrast ORDERING CLINICIAN: Porfirio Mylar Dohmeier MD CLINICAL HISTORY: 39 year old man with visual changes and pseudotumor cerebri syndrome COMPARISON FILMS: CT 12/23/2018 TECHNIQUE: MRI of the brain with and without contrast was obtained utilizing 5 mm axial slices with T1, T2, T2 flair, T2 star gradient echo and diffusion weighted views.  T1 sagittal, T2 coronal and postcontrast views in the axial and coronal plane were obtained. CONTRAST: 20 mL MultiHance IMAGING SITE: [facility], [address] FINDINGS: On sagittal images, the spinal cord is imaged caudally to C3 and is normal in caliber.    The contents of the posterior fossa are of normal size and position.   The pituitary gland and optic chiasm appear normal.    Brain volume appears normal.   The ventricles are normal in size and without distortion.  There are no abnormal extra-axial collections of fluid.  The cerebellum and brainstem appears normal.   The deep gray matter appears normal.  The cerebral hemispheres appear normal.  Diffusion weighted images are normal.  Gradient echo heme weighted images are normal.  The orbits appear normal.   The VIIth/VIIIth nerve complex appears normal.  The mastoid air cells appear normal.  The paranasal sinuses appear normal.  Flow voids are identified within the major intracerebral arteries.   After the infusion of contrast material, a normal enhancement pattern is noted.  No changes compared to the CT dated 12/23/2018   This is a normal MRI of the brain with and without contrast. INTERPRETING PHYSICIAN: Richard A. Epimenio Foot, MD, PhD, FAAN Certified in  Neuroimaging by [employer]  NCV with EMG(electromyography)  Result Date: 07/05/2020 Suanne Marker, MD     07/09/2020  3:04 PM  [facility] NCS (NERVE CONDUCTION STUDY) WITH EMG (ELECTROMYOGRAPHY) REPORT STUDY DATE: 07/09/20 PATIENT NAME: Marq Rebello DOB: 1981/08/07 MRN: [id number] ORDERING CLINICIAN: Melvyn Novas, MD TECHNOLOGIST: Durenda Age ELECTROMYOGRAPHER: Glenford Bayley. Penumalli, MD CLINICAL INFORMATION: 39 year old male with with upper extremity pain and weakness. FINDINGS: NERVE CONDUCTION STUDY: Bilateral median and ulnar motor responses are normal. Bilateral median and ulnar sensory responses are normal. Bilateral median to ulnar transcarpal mixed nerve comparison studies are normal. Bilateral ulnar F-wave latencies are normal. NEEDLE ELECTROMYOGRAPHY: Needle examination of left upper extremity deltoid, biceps, triceps,  file  Physical Activity:    Days of Exercise per Week: Not on file   Minutes of Exercise per Session: Not on file  Stress:    Feeling of Stress : Not on file  Social Connections:    Frequency of Communication with Friends and Family: Not on file   Frequency of Social Gatherings with Friends and Family: Not on file   Attends Religious Services: Not on file   Active Member of Clubs or Organizations: Not on file   Attends Banker Meetings: Not on file   Marital Status: Not on file    Review of Systems: A 12 point ROS discussed and pertinent positives are indicated in the HPI above.  All other systems are negative.  Review of Systems  Constitutional: Negative for activity change, fatigue and fever.  HENT: Negative for tinnitus and voice change.   Eyes: Positive for visual disturbance.  Respiratory: Negative for cough and shortness of breath.   Cardiovascular: Negative for chest pain.  Gastrointestinal: Negative for abdominal pain.  Musculoskeletal: Negative for gait problem.  Neurological: Positive for headaches. Negative for dizziness, tremors, seizures, syncope, facial asymmetry, speech difficulty, weakness, light-headedness and numbness.  Psychiatric/Behavioral: Negative for behavioral problems and confusion.    Vital Signs: BP 125/82    Pulse 92    Temp 98.1 F (36.7 C)  (Oral)    Resp 16    Ht 6' (1.829 m)    Wt 226 lb (102.5 kg)    SpO2 100%    BMI 30.65 kg/m   Physical Exam Vitals reviewed.  HENT:     Head: Atraumatic.     Mouth/Throat:     Mouth: Mucous membranes are moist.  Eyes:     Extraocular Movements: Extraocular movements intact.  Cardiovascular:     Rate and Rhythm: Normal rate and regular rhythm.     Heart sounds: Normal heart sounds.  Pulmonary:     Effort: Pulmonary effort is normal.     Breath sounds: Normal breath sounds.  Abdominal:     Palpations: Abdomen is soft.     Tenderness: There is no abdominal tenderness.  Musculoskeletal:        General: Normal range of motion.     Right lower leg: No edema.     Left lower leg: No edema.  Skin:    General: Skin is warm and dry.  Neurological:     Mental Status: He is alert and oriented to person, place, and time.  Psychiatric:        Behavior: Behavior normal.     Imaging: MR ANGIO HEAD WO CONTRAST  Result Date: 07/12/2020  [facility] [address], [address] [place], [place] [address] [phone] NEUROIMAGING REPORT STUDY DATE: 07/11/2020 PATIENT NAME: [name] DOB: [birth year] MRN: [id number] EXAM: MR angiogram of the intracranial arteries ORDERING CLINICIAN: Porfirio Mylar Dohmeier MD CLINICAL HISTORY: 39 year old man with pseudotumor cerebri and visual changes COMPARISON FILMS: None TECHNIQUE: MR angiogram of the head was obtained utilizing 3D time of flight sequences from below the vertebrobasilar junction up to the intracranial vasculature without contrast.  Computerized reconstructions were obtained. CONTRAST: None IMAGING SITE: [facility], [address], [place], [place] [address] FINDINGS: The imaged extracranial and intracranial portions of the internal carotid arteries appear normal.  There is moderate stenosis of the M1 segment of the left middle cerebral artery.  There is negligible flow through the A1 segment of the left anterior  cerebral artery.  This is likely due  file  Physical Activity:    Days of Exercise per Week: Not on file   Minutes of Exercise per Session: Not on file  Stress:    Feeling of Stress : Not on file  Social Connections:    Frequency of Communication with Friends and Family: Not on file   Frequency of Social Gatherings with Friends and Family: Not on file   Attends Religious Services: Not on file   Active Member of Clubs or Organizations: Not on file   Attends Banker Meetings: Not on file   Marital Status: Not on file    Review of Systems: A 12 point ROS discussed and pertinent positives are indicated in the HPI above.  All other systems are negative.  Review of Systems  Constitutional: Negative for activity change, fatigue and fever.  HENT: Negative for tinnitus and voice change.   Eyes: Positive for visual disturbance.  Respiratory: Negative for cough and shortness of breath.   Cardiovascular: Negative for chest pain.  Gastrointestinal: Negative for abdominal pain.  Musculoskeletal: Negative for gait problem.  Neurological: Positive for headaches. Negative for dizziness, tremors, seizures, syncope, facial asymmetry, speech difficulty, weakness, light-headedness and numbness.  Psychiatric/Behavioral: Negative for behavioral problems and confusion.    Vital Signs: BP 125/82    Pulse 92    Temp 98.1 F (36.7 C)  (Oral)    Resp 16    Ht 6' (1.829 m)    Wt 226 lb (102.5 kg)    SpO2 100%    BMI 30.65 kg/m   Physical Exam Vitals reviewed.  HENT:     Head: Atraumatic.     Mouth/Throat:     Mouth: Mucous membranes are moist.  Eyes:     Extraocular Movements: Extraocular movements intact.  Cardiovascular:     Rate and Rhythm: Normal rate and regular rhythm.     Heart sounds: Normal heart sounds.  Pulmonary:     Effort: Pulmonary effort is normal.     Breath sounds: Normal breath sounds.  Abdominal:     Palpations: Abdomen is soft.     Tenderness: There is no abdominal tenderness.  Musculoskeletal:        General: Normal range of motion.     Right lower leg: No edema.     Left lower leg: No edema.  Skin:    General: Skin is warm and dry.  Neurological:     Mental Status: He is alert and oriented to person, place, and time.  Psychiatric:        Behavior: Behavior normal.     Imaging: MR ANGIO HEAD WO CONTRAST  Result Date: 07/12/2020  [facility] [address], [address] [place], [place] [address] [phone] NEUROIMAGING REPORT STUDY DATE: 07/11/2020 PATIENT NAME: [name] DOB: [birth year] MRN: [id number] EXAM: MR angiogram of the intracranial arteries ORDERING CLINICIAN: Porfirio Mylar Dohmeier MD CLINICAL HISTORY: 39 year old man with pseudotumor cerebri and visual changes COMPARISON FILMS: None TECHNIQUE: MR angiogram of the head was obtained utilizing 3D time of flight sequences from below the vertebrobasilar junction up to the intracranial vasculature without contrast.  Computerized reconstructions were obtained. CONTRAST: None IMAGING SITE: [facility], [address], [place], [place] [address] FINDINGS: The imaged extracranial and intracranial portions of the internal carotid arteries appear normal.  There is moderate stenosis of the M1 segment of the left middle cerebral artery.  There is negligible flow through the A1 segment of the left anterior  cerebral artery.  This is likely due  to a hypoplastic/aplastic segment which would be a normal variant.  Severe stenosis could have a similar appearance.  The right middle cerebral and anterior cerebral arteries appear normal. In the posterior circulation, the right vertebral artery is dominant.. No stenosis is noted within the vertebral arteries and the basilar arteries. The posterior cerebral arteries appear normal. No aneurysms were identified.   This MR angiogram of the intracranial arteries shows the following: 1.   Moderate stenosis of the M1 segment of the left middle cerebral artery. 2.   Negligible flow through the A1 segment of the left anterior cerebral artery.  This could be due to a hypoplastic/aplastic segment, a normal variant.  This could also be due to severe stenosis.  The left A2 segment derived this flow from the right to through the anterior communicating artery. 3.   No aneurysms are noted. INTERPRETING PHYSICIAN: Richard A. Epimenio Foot, MD, PhD, FAAN Certified in  Neuroimaging by [employer]   MR BRAIN W WO CONTRAST  Result Date: 07/12/2020  [facility] [address] [place], [place] [address] [phone] NEUROIMAGING REPORT STUDY DATE: 07/11/2020 PATIENT NAME: [name] DOB: [birth year] MRN: [id number] EXAM: MRI Brain with and without contrast ORDERING CLINICIAN: Porfirio Mylar Dohmeier MD CLINICAL HISTORY: 39 year old man with visual changes and pseudotumor cerebri syndrome COMPARISON FILMS: CT 12/23/2018 TECHNIQUE: MRI of the brain with and without contrast was obtained utilizing 5 mm axial slices with T1, T2, T2 flair, T2 star gradient echo and diffusion weighted views.  T1 sagittal, T2 coronal and postcontrast views in the axial and coronal plane were obtained. CONTRAST: 20 mL MultiHance IMAGING SITE: [facility], [address] FINDINGS: On sagittal images, the spinal cord is imaged caudally to C3 and is normal in caliber.    The contents of the posterior fossa are of normal size and position.   The pituitary gland and optic chiasm appear normal.    Brain volume appears normal.   The ventricles are normal in size and without distortion.  There are no abnormal extra-axial collections of fluid.  The cerebellum and brainstem appears normal.   The deep gray matter appears normal.  The cerebral hemispheres appear normal.  Diffusion weighted images are normal.  Gradient echo heme weighted images are normal.  The orbits appear normal.   The VIIth/VIIIth nerve complex appears normal.  The mastoid air cells appear normal.  The paranasal sinuses appear normal.  Flow voids are identified within the major intracerebral arteries.   After the infusion of contrast material, a normal enhancement pattern is noted.  No changes compared to the CT dated 12/23/2018   This is a normal MRI of the brain with and without contrast. INTERPRETING PHYSICIAN: Richard A. Epimenio Foot, MD, PhD, FAAN Certified in  Neuroimaging by [employer]  NCV with EMG(electromyography)  Result Date: 07/05/2020 Suanne Marker, MD     07/09/2020  3:04 PM  [facility] NCS (NERVE CONDUCTION STUDY) WITH EMG (ELECTROMYOGRAPHY) REPORT STUDY DATE: 07/09/20 PATIENT NAME: Marq Rebello DOB: 1981/08/07 MRN: [id number] ORDERING CLINICIAN: Melvyn Novas, MD TECHNOLOGIST: Durenda Age ELECTROMYOGRAPHER: Glenford Bayley. Penumalli, MD CLINICAL INFORMATION: 39 year old male with with upper extremity pain and weakness. FINDINGS: NERVE CONDUCTION STUDY: Bilateral median and ulnar motor responses are normal. Bilateral median and ulnar sensory responses are normal. Bilateral median to ulnar transcarpal mixed nerve comparison studies are normal. Bilateral ulnar F-wave latencies are normal. NEEDLE ELECTROMYOGRAPHY: Needle examination of left upper extremity deltoid, biceps, triceps,  file  Physical Activity:    Days of Exercise per Week: Not on file   Minutes of Exercise per Session: Not on file  Stress:    Feeling of Stress : Not on file  Social Connections:    Frequency of Communication with Friends and Family: Not on file   Frequency of Social Gatherings with Friends and Family: Not on file   Attends Religious Services: Not on file   Active Member of Clubs or Organizations: Not on file   Attends Banker Meetings: Not on file   Marital Status: Not on file    Review of Systems: A 12 point ROS discussed and pertinent positives are indicated in the HPI above.  All other systems are negative.  Review of Systems  Constitutional: Negative for activity change, fatigue and fever.  HENT: Negative for tinnitus and voice change.   Eyes: Positive for visual disturbance.  Respiratory: Negative for cough and shortness of breath.   Cardiovascular: Negative for chest pain.  Gastrointestinal: Negative for abdominal pain.  Musculoskeletal: Negative for gait problem.  Neurological: Positive for headaches. Negative for dizziness, tremors, seizures, syncope, facial asymmetry, speech difficulty, weakness, light-headedness and numbness.  Psychiatric/Behavioral: Negative for behavioral problems and confusion.    Vital Signs: BP 125/82    Pulse 92    Temp 98.1 F (36.7 C)  (Oral)    Resp 16    Ht 6' (1.829 m)    Wt 226 lb (102.5 kg)    SpO2 100%    BMI 30.65 kg/m   Physical Exam Vitals reviewed.  HENT:     Head: Atraumatic.     Mouth/Throat:     Mouth: Mucous membranes are moist.  Eyes:     Extraocular Movements: Extraocular movements intact.  Cardiovascular:     Rate and Rhythm: Normal rate and regular rhythm.     Heart sounds: Normal heart sounds.  Pulmonary:     Effort: Pulmonary effort is normal.     Breath sounds: Normal breath sounds.  Abdominal:     Palpations: Abdomen is soft.     Tenderness: There is no abdominal tenderness.  Musculoskeletal:        General: Normal range of motion.     Right lower leg: No edema.     Left lower leg: No edema.  Skin:    General: Skin is warm and dry.  Neurological:     Mental Status: He is alert and oriented to person, place, and time.  Psychiatric:        Behavior: Behavior normal.     Imaging: MR ANGIO HEAD WO CONTRAST  Result Date: 07/12/2020  [facility] [address], [address] [place], [place] [address] [phone] NEUROIMAGING REPORT STUDY DATE: 07/11/2020 PATIENT NAME: [name] DOB: [birth year] MRN: [id number] EXAM: MR angiogram of the intracranial arteries ORDERING CLINICIAN: Porfirio Mylar Dohmeier MD CLINICAL HISTORY: 39 year old man with pseudotumor cerebri and visual changes COMPARISON FILMS: None TECHNIQUE: MR angiogram of the head was obtained utilizing 3D time of flight sequences from below the vertebrobasilar junction up to the intracranial vasculature without contrast.  Computerized reconstructions were obtained. CONTRAST: None IMAGING SITE: [facility], [address], [place], [place] [address] FINDINGS: The imaged extracranial and intracranial portions of the internal carotid arteries appear normal.  There is moderate stenosis of the M1 segment of the left middle cerebral artery.  There is negligible flow through the A1 segment of the left anterior  cerebral artery.  This is likely due  file  Physical Activity:    Days of Exercise per Week: Not on file   Minutes of Exercise per Session: Not on file  Stress:    Feeling of Stress : Not on file  Social Connections:    Frequency of Communication with Friends and Family: Not on file   Frequency of Social Gatherings with Friends and Family: Not on file   Attends Religious Services: Not on file   Active Member of Clubs or Organizations: Not on file   Attends Banker Meetings: Not on file   Marital Status: Not on file    Review of Systems: A 12 point ROS discussed and pertinent positives are indicated in the HPI above.  All other systems are negative.  Review of Systems  Constitutional: Negative for activity change, fatigue and fever.  HENT: Negative for tinnitus and voice change.   Eyes: Positive for visual disturbance.  Respiratory: Negative for cough and shortness of breath.   Cardiovascular: Negative for chest pain.  Gastrointestinal: Negative for abdominal pain.  Musculoskeletal: Negative for gait problem.  Neurological: Positive for headaches. Negative for dizziness, tremors, seizures, syncope, facial asymmetry, speech difficulty, weakness, light-headedness and numbness.  Psychiatric/Behavioral: Negative for behavioral problems and confusion.    Vital Signs: BP 125/82    Pulse 92    Temp 98.1 F (36.7 C)  (Oral)    Resp 16    Ht 6' (1.829 m)    Wt 226 lb (102.5 kg)    SpO2 100%    BMI 30.65 kg/m   Physical Exam Vitals reviewed.  HENT:     Head: Atraumatic.     Mouth/Throat:     Mouth: Mucous membranes are moist.  Eyes:     Extraocular Movements: Extraocular movements intact.  Cardiovascular:     Rate and Rhythm: Normal rate and regular rhythm.     Heart sounds: Normal heart sounds.  Pulmonary:     Effort: Pulmonary effort is normal.     Breath sounds: Normal breath sounds.  Abdominal:     Palpations: Abdomen is soft.     Tenderness: There is no abdominal tenderness.  Musculoskeletal:        General: Normal range of motion.     Right lower leg: No edema.     Left lower leg: No edema.  Skin:    General: Skin is warm and dry.  Neurological:     Mental Status: He is alert and oriented to person, place, and time.  Psychiatric:        Behavior: Behavior normal.     Imaging: MR ANGIO HEAD WO CONTRAST  Result Date: 07/12/2020  [facility] [address], [address] [place], [place] [address] [phone] NEUROIMAGING REPORT STUDY DATE: 07/11/2020 PATIENT NAME: [name] DOB: [birth year] MRN: [id number] EXAM: MR angiogram of the intracranial arteries ORDERING CLINICIAN: Porfirio Mylar Dohmeier MD CLINICAL HISTORY: 39 year old man with pseudotumor cerebri and visual changes COMPARISON FILMS: None TECHNIQUE: MR angiogram of the head was obtained utilizing 3D time of flight sequences from below the vertebrobasilar junction up to the intracranial vasculature without contrast.  Computerized reconstructions were obtained. CONTRAST: None IMAGING SITE: [facility], [address], [place], [place] [address] FINDINGS: The imaged extracranial and intracranial portions of the internal carotid arteries appear normal.  There is moderate stenosis of the M1 segment of the left middle cerebral artery.  There is negligible flow through the A1 segment of the left anterior  cerebral artery.  This is likely due

## 2020-07-16 NOTE — Progress Notes (Signed)
IMPRESSION: This MR angiogram of the intracranial arteries shows the following: 1.   Moderate stenosis of the M1 segment of the left middle cerebral artery. 2.   Negligible flow through the A1 segment of the left anterior cerebral artery.  This could be due to a hypoplastic/aplastic segment, a normal variant.  This could also be due to severe stenosis.  The left A2 segment derived this flow from the right to through the anterior communicating artery. 3.   No aneurysms are noted.  We need to follow up with a catheter angiogram - order was paced last Thursday.

## 2020-07-17 NOTE — Progress Notes (Signed)
IMPRESSION: Approximately 65-70% stenosis of the left internal carotid artery supraclinoid segment secondary to a mildly irregular intracranial arteriosclerotic plaque.  No opacification of the left anterior cerebral A1 segment most likely represents a pathologic intracranial occlusion.  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  Approximately 10 mm x 8.3 mm pyramidal configured high-riding jugular bulb.  PLAN: Findings reviewed with the patient and his mother. They were informed the right internal carotid artery stenosis was most likely related to intracranial arteriosclerotic disease given the patient's history of diabetes.  Patient advised to take 325 mg of aspirin a day as a secondary stroke prevention strategy.  Recommend follow-up CT angiogram of the head and neck in 6 months time to evaluate for interval changes.

## 2020-07-18 ENCOUNTER — Encounter: Payer: Self-pay | Admitting: Neurology

## 2020-07-18 ENCOUNTER — Other Ambulatory Visit: Payer: Self-pay | Admitting: Family Medicine

## 2020-07-18 NOTE — Telephone Encounter (Signed)
I spoke to the patient. He will keep his pending LP scheduled. He accepted the office visit on 07/19/20 at 12:30 (he will check-in at noon).

## 2020-07-18 NOTE — Telephone Encounter (Signed)
We will not cancel the spinal tap as there can still be higher fluid pressure present. I like to meet over lunch tomorrow- is that possible ?    [name]- please make a 12.30 visit for this patient . CD

## 2020-07-19 ENCOUNTER — Ambulatory Visit (INDEPENDENT_AMBULATORY_CARE_PROVIDER_SITE_OTHER): Payer: 59 | Admitting: Neurology

## 2020-07-19 ENCOUNTER — Encounter: Payer: Self-pay | Admitting: Neurology

## 2020-07-19 VITALS — BP 112/75 | HR 89 | Ht 72.0 in | Wt 228.0 lb

## 2020-07-19 DIAGNOSIS — H53142 Visual discomfort, left eye: Secondary | ICD-10-CM

## 2020-07-19 DIAGNOSIS — I672 Cerebral atherosclerosis: Secondary | ICD-10-CM | POA: Diagnosis not present

## 2020-07-19 DIAGNOSIS — H468 Other optic neuritis: Secondary | ICD-10-CM | POA: Diagnosis not present

## 2020-07-19 NOTE — Patient Instructions (Addendum)
DASH Eating Plan DASH stands for "Dietary Approaches to Stop Hypertension." The DASH eating plan is a healthy eating plan that has been shown to reduce high blood pressure (hypertension).  It may also reduce your risk for type 2 diabetes, heart disease, and stroke.   Transient Ischemic Attack  A transient ischemic attack (TIA) is a "warning stroke" that causes stroke-like symptoms that go away quickly. A TIA does not cause lasting damage to the brain. But having a TIA is a sign that you may be at risk for a stroke. Lifestyle changes and medical treatments can help prevent a stroke. It is important to know the symptoms of a TIA and what to do. Get help right away, even if your symptoms go away. The symptoms of a TIA are the same as those of a stroke. They can happen fast, and they usually go away within minutes or hours. They can include:  Weakness or loss of feeling in your face, arm, or leg. This often happens on one side of your body.  Trouble walking.  Trouble moving your arms or legs.  Trouble talking or understanding what people are saying.  Trouble seeing.  Seeing two of one object (double vision).  Feeling dizzy.  Feeling confused.  Loss of balance or coordination.  Feeling sick to your stomach (nauseous) and throwing up (vomiting).  A very bad headache for no reason. What increases the risk? Certain things may make you more likely to have a TIA. Some of these are things that you can change, such as:  Being very overweight (obese).  Using products that contain nicotine or tobacco, such as cigarettes and e-cigarettes.  Taking birth control pills.  Not being active.  Drinking too much alcohol.  Using drugs. Other risk factors include:  Having an irregular heartbeat (atrial fibrillation).  Being African American or Hispanic.  Having had blood clots, stroke, TIA, or heart attack in the past.  Being a woman with a history of high blood pressure in pregnancy  (preeclampsia).  Being over the age of 39.  Being male.  Having family history of stroke.  Having the following diseases or conditions: ? High blood pressure. ? High cholesterol. ? Diabetes. ? Heart disease. ? Sickle cell disease. ? Sleep apnea. ? Migraine headache. ? Long-term (chronic) diseases that cause soreness and swelling (inflammation). ? Disorders that affect how your blood clots. Follow these instructions at home: Medicines   Take over-the-counter and prescription medicines only as told by your doctor.  If you were told to take aspirin or another medicine to thin your blood, take it exactly as told by your doctor. ? Taking too much of the medicine can cause bleeding. ? Taking too little of the medicine may not work to treat the problem. Eating and drinking   Eat 5 or more servings of fruits and vegetables each day.  Follow instructions from your doctor about your diet. You may need to follow a certain diet to help lower your risk of having a stroke. You may need to: ? Eat a diet that is low in fat and salt. ? Eat foods that contain a lot of fiber. ? Limit the amount of carbohydrates and sugar in your diet.  Limit alcohol intake to 1 drink a day for nonpregnant women and 2 drinks a day for men. One drink equals 12 oz of beer, 5 oz of wine, or 1 oz of hard liquor. General instructions  Keep a healthy weight.  Stay active. Try to get  at least 30 minutes of activity on all or most days.  Find out if you have a condition called sleep apnea. Get treatment if needed.  Do not use any products that contain nicotine or tobacco, such as cigarettes and e-cigarettes. If you need help quitting, ask your doctor.  Do not abuse drugs.  Keep all follow-up visits as told by your doctor. This is important. Get help right away if:  You have any signs of stroke. "BE FAST" is an easy way to remember the main warning signs: ? B - Balance. Signs are dizziness, sudden trouble  walking, or loss of balance. ? E - Eyes. Signs are trouble seeing or a sudden change in how you see. ? F - Face. Signs are sudden weakness or loss of feeling of the face, or the face or eyelid drooping on one side. ? A - Arms. Signs are weakness or loss of feeling in an arm. This happens suddenly and usually on one side of the body. ? S - Speech. Signs are sudden trouble speaking, slurred speech, or trouble understanding what people say. ? T - Time. Time to call emergency services. Write down what time symptoms started.  You have other signs of stroke, such as: ? A sudden, very bad headache with no known cause. ? Feeling sick to your stomach (nausea). ? Throwing up (vomiting). ? Jerky movements that you cannot control (seizure). These symptoms may be an emergency. Do not wait to see if the symptoms will go away. Get medical help right away. Call your local emergency services (911 in the [place] Do not drive yourself to the hospital. Summary  A transient ischemic attack (TIA) is a "warning stroke" that causes stroke-like symptoms that go away quickly.  A TIA is a medical emergency. Get help right away, even if your symptoms go away.  A TIA does not cause lasting damage to the brain.  Having a TIA is a sign that you may be at risk for a stroke. Lifestyle changes and medical treatments can help prevent a stroke. This information is not intended to replace advice given to you by your health care provider. Make sure you discuss any questions you have with your health care provider. Document Revised: 07/09/2018 Document Reviewed: 01/14/2017 Elsevier Patient Education  2020 [employer].  Stroke Prevention Some medical conditions and lifestyle choices can lead to a higher risk for a stroke. You can help to prevent a stroke by making nutrition, lifestyle, and other changes. What nutrition changes can be made?   Eat healthy foods. ? Choose foods that are high in fiber. These include:  Fresh  fruits.  Fresh vegetables.  Whole grains. ? Eat at least 5 or more servings of fruits and vegetables each day. Try to fill half of your plate at each meal with fruits and vegetables. ? Choose lean protein foods. These include:  Lowfat (lean) cuts of meat.  Chicken without skin.  Fish.  Tofu.  Beans.  Nuts. ? Eat low-fat dairy products. ? Avoid foods that:  Are high in salt (sodium).  Have saturated fat.  Have trans fat.  Have cholesterol.  Are processed.  Are premade.  Follow eating guidelines as told by your doctor. These may include: ? Reducing how many calories you eat and drink each day. ? Limiting how much salt you eat or drink each day to 1,500 milligrams (mg). ? Using only healthy fats for cooking. These include:  Olive oil.  Canola oil.  Sunflower oil. ?  Counting how many carbohydrates you eat and drink each day. What lifestyle changes can be made?  Try to stay at a healthy weight. Talk to your doctor about what a good weight is for you.  Get at least 30 minutes of moderate physical activity at least 5 days a week. This can include: ? Fast walking. ? Biking. ? Swimming.  Do not use any products that have nicotine or tobacco. This includes cigarettes and e-cigarettes. If you need help quitting, ask your doctor. Avoid being around tobacco smoke in general.  Limit how much alcohol you drink to no more than 1 drink a day for nonpregnant women and 2 drinks a day for men. One drink equals 12 oz of beer, 5 oz of wine, or 1 oz of hard liquor.  Do not use drugs.  Avoid taking birth control pills. Talk to your doctor about the risks of taking birth control pills if: ? You are over 3 years old. ? You smoke. ? You get migraines. ? You have had a blood clot. What other changes can be made?  Manage your cholesterol. ? It is important to eat a healthy diet. ? If your cholesterol cannot be managed through your diet, you may also need to take medicines.  Take medicines as told by your doctor.  Manage your diabetes. ? It is important to eat a healthy diet and to exercise regularly. ? If your blood sugar cannot be managed through diet and exercise, you may need to take medicines. Take medicines as told by your doctor.  Control your high blood pressure (hypertension). ? Try to keep your blood pressure below 130/80. This can help lower your risk of stroke. ? It is important to eat a healthy diet and to exercise regularly. ? If your blood pressure cannot be managed through diet and exercise, you may need to take medicines. Take medicines as told by your doctor. ? Ask your doctor if you should check your blood pressure at home. ? Have your blood pressure checked every year. Do this even if your blood pressure is normal.  Talk to your doctor about getting checked for a sleep disorder. Signs of this can include: ? Snoring a lot. ? Feeling very tired.  Take over-the-counter and prescription medicines only as told by your doctor. These may include aspirin or blood thinners (antiplatelets or anticoagulants).  Make sure that any other medical conditions you have are managed. Where to find more information  American Stroke Association: www.strokeassociation.org  National Stroke Association: www.stroke.org Get help right away if:  You have any symptoms of stroke. "BE FAST" is an easy way to remember the main warning signs: ? B - Balance. Signs are dizziness, sudden trouble walking, or loss of balance. ? E - Eyes. Signs are trouble seeing or a sudden change in how you see. ? F - Face. Signs are sudden weakness or loss of feeling of the face, or the face or eyelid drooping on one side. ? A - Arms. Signs are weakness or loss of feeling in an arm. This happens suddenly and usually on one side of the body. ? S - Speech. Signs are sudden trouble speaking, slurred speech, or trouble understanding what people say. ? T - Time. Time to call emergency  services. Write down what time symptoms started.  You have other signs of stroke, such as: ? A sudden, very bad headache with no known cause. ? Feeling sick to your stomach (nausea). ? Throwing up (vomiting). ? Jerky  movements you cannot control (seizure). These symptoms may represent a serious problem that is an emergency. Do not wait to see if the symptoms will go away. Get medical help right away. Call your local emergency services (911 in the [place] Do not drive yourself to the hospital. Summary  You can prevent a stroke by eating healthy, exercising, not smoking, drinking less alcohol, and treating other health problems, such as diabetes, high blood pressure, or high cholesterol.  Do not use any products that contain nicotine or tobacco, such as cigarettes and e-cigarettes.  Get help right away if you have any signs or symptoms of a stroke. This information is not intended to replace advice given to you by your health care provider. Make sure you discuss any questions you have with your health care provider. Document Revised: 12/09/2018 Document Reviewed: 01/14/2017 Elsevier Patient Education  2020 [employer] The DASH eating plan may also help with weight loss. What are tips for following this plan?  General guidelines  Avoid eating more than 2,300 mg (milligrams) of salt (sodium) a day. If you have hypertension, you may need to reduce your sodium intake to 1,500 mg a day.  Limit alcohol intake to no more than 1 drink a day for nonpregnant women and 2 drinks a day for men. One drink equals 12 oz of beer, 5 oz of wine, or 1 oz of hard liquor.  Work with your health care provider to maintain a healthy body weight or to lose weight. Ask what an ideal weight is for you.  Get at least 30 minutes of exercise that causes your heart to beat faster (aerobic exercise) most days of the week. Activities may include walking, swimming, or biking.  Work with your health care provider or  diet and nutrition specialist (dietitian) to adjust your eating plan to your individual calorie needs. Reading food labels   Check food labels for the amount of sodium per serving. Choose foods with less than 5 percent of the Daily Value of sodium. Generally, foods with less than 300 mg of sodium per serving fit into this eating plan.  To find whole grains, look for the word "whole" as the first word in the ingredient list. Shopping  Buy products labeled as "low-sodium" or "no salt added."  Buy fresh foods. Avoid canned foods and premade or frozen meals. Cooking  Avoid adding salt when cooking. Use salt-free seasonings or herbs instead of table salt or sea salt. Check with your health care provider or pharmacist before using salt substitutes.  Do not fry foods. Cook foods using healthy methods such as baking, boiling, grilling, and broiling instead.  Cook with heart-healthy oils, such as olive, canola, soybean, or sunflower oil. Meal planning  Eat a balanced diet that includes: ? 5 or more servings of fruits and vegetables each day. At each meal, try to fill half of your plate with fruits and vegetables. ? Up to 6-8 servings of whole grains each day. ? Less than 6 oz of lean meat, poultry, or fish each day. A 3-oz serving of meat is about the same size as a deck of cards. One egg equals 1 oz. ? 2 servings of low-fat dairy each day. ? A serving of nuts, seeds, or beans 5 times each week. ? Heart-healthy fats. Healthy fats called Omega-3 fatty acids are found in foods such as flaxseeds and coldwater fish, like sardines, salmon, and mackerel.  Limit how much you eat of the following: ? Canned or prepackaged foods. ?  Food that is high in trans fat, such as fried foods. ? Food that is high in saturated fat, such as fatty meat. ? Sweets, desserts, sugary drinks, and other foods with added sugar. ? Full-fat dairy products.  Do not salt foods before eating.  Try to eat at least 2  vegetarian meals each week.  Eat more home-cooked food and less restaurant, buffet, and fast food.  When eating at a restaurant, ask that your food be prepared with less salt or no salt, if possible. What foods are recommended? The items listed may not be a complete list. Talk with your dietitian about what dietary choices are best for you. Grains Whole-grain or whole-wheat bread. Whole-grain or whole-wheat pasta. Brown rice. Orpah Cobb. Bulgur. Whole-grain and low-sodium cereals. Pita bread. Low-fat, low-sodium crackers. Whole-wheat flour tortillas. Vegetables Fresh or frozen vegetables (raw, steamed, roasted, or grilled). Low-sodium or reduced-sodium tomato and vegetable juice. Low-sodium or reduced-sodium tomato sauce and tomato paste. Low-sodium or reduced-sodium canned vegetables. Fruits All fresh, dried, or frozen fruit. Canned fruit in natural juice (without added sugar). Meat and other protein foods Skinless chicken or [place]. Ground chicken or [place]. Pork with fat trimmed off. Fish and seafood. Egg whites. Dried beans, peas, or lentils. Unsalted nuts, nut butters, and seeds. Unsalted canned beans. Lean cuts of beef with fat trimmed off. Low-sodium, lean deli meat. Dairy Low-fat (1%) or fat-free (skim) milk. Fat-free, low-fat, or reduced-fat cheeses. Nonfat, low-sodium ricotta or cottage cheese. Low-fat or nonfat yogurt. Low-fat, low-sodium cheese. Fats and oils Soft margarine without trans fats. Vegetable oil. Low-fat, reduced-fat, or light mayonnaise and salad dressings (reduced-sodium). Canola, safflower, olive, soybean, and sunflower oils. Avocado. Seasoning and other foods Herbs. Spices. Seasoning mixes without salt. Unsalted popcorn and pretzels. Fat-free sweets. What foods are not recommended? The items listed may not be a complete list. Talk with your dietitian about what dietary choices are best for you. Grains Baked goods made with fat, such as croissants, muffins, or  some breads. Dry pasta or rice meal packs. Vegetables Creamed or fried vegetables. Vegetables in a cheese sauce. Regular canned vegetables (not low-sodium or reduced-sodium). Regular canned tomato sauce and paste (not low-sodium or reduced-sodium). Regular tomato and vegetable juice (not low-sodium or reduced-sodium). Rosita Fire. Olives. Fruits Canned fruit in a light or heavy syrup. Fried fruit. Fruit in cream or butter sauce. Meat and other protein foods Fatty cuts of meat. Ribs. Fried meat. Tomasa Blase. Sausage. Bologna and other processed lunch meats. Salami. Fatback. Hotdogs. Bratwurst. Salted nuts and seeds. Canned beans with added salt. Canned or smoked fish. Whole eggs or egg yolks. Chicken or [place] with skin. Dairy Whole or 2% milk, cream, and half-and-half. Whole or full-fat cream cheese. Whole-fat or sweetened yogurt. Full-fat cheese. Nondairy creamers. Whipped toppings. Processed cheese and cheese spreads. Fats and oils Butter. Stick margarine. Lard. Shortening. Ghee. Bacon fat. Tropical oils, such as coconut, palm kernel, or palm oil. Seasoning and other foods Salted popcorn and pretzels. Onion salt, garlic salt, seasoned salt, table salt, and sea salt. Worcestershire sauce. Tartar sauce. Barbecue sauce. Teriyaki sauce. Soy sauce, including reduced-sodium. Steak sauce. Canned and packaged gravies. Fish sauce. Oyster sauce. Cocktail sauce. Horseradish that you find on the shelf. Ketchup. Mustard. Meat flavorings and tenderizers. Bouillon cubes. Hot sauce and Tabasco sauce. Premade or packaged marinades. Premade or packaged taco seasonings. Relishes. Regular salad dressings. Where to find more information:  National Heart, Lung, and Blood Institute: PopSteam.is  American Heart Association: www.heart.org Summary  The DASH eating plan is  a healthy eating plan that has been shown to reduce high blood pressure (hypertension). It may also reduce your risk for type 2 diabetes, heart disease,  and stroke.  With the DASH eating plan, you should limit salt (sodium) intake to 2,300 mg a day. If you have hypertension, you may need to reduce your sodium intake to 1,500 mg a day.  When on the DASH eating plan, aim to eat more fresh fruits and vegetables, whole grains, lean proteins, low-fat dairy, and heart-healthy fats.  Work with your health care provider or diet and nutrition specialist (dietitian) to adjust your eating plan to your individual calorie needs. This information is not intended to replace advice given to you by your health care provider. Make sure you discuss any questions you have with your health care provider. Document Revised: 09/25/2017 Document Reviewed: 10/06/2016 Elsevier Patient Education  2020 [employer].

## 2020-07-19 NOTE — Progress Notes (Signed)
SLEEP MEDICINE CLINIC   Provider:  Melvyn Novas, MD  Primary Care Physician:  Lucila Maine, Guerry Bruin, MD    Referring Provider: ophthalmologist Dr. Luciana Axe, who is also a retina specialist.   The patient is further followed by endocrinologist Dr. Molli Knock since he is diabetic type I.   Chief Complaint  Patient presents with  . Follow-up    pt alone, rm 10. follow up post  CTA, MRA.     This is a note for a brief revisit recapture from 19 July 2020. I have the pleasure of meeting with [name] today, he underwent an angiogram of the brain on 06-2020. His findings from her previous MRA of the brain from the 15th of this month were confirmed there was at approximately 65 to 70% stenosis of the left intracranial internal carotid artery at the supraclinoid segment noted mild irregularity of the of the flow showed that there must be an atherosclerotic plaque. 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 These findings justifying now much stronger emphasis prevention of intracranial arterial sclerosis and general atherosclerosis given the patient's history of young onset diabetes. He already was treated with atorvastatin he now will take a baby aspirin a day there are conflicting messages as to what he was told after the procedure and what the nurse wrote down when she summarized the findings and visit. I think that he should take for 3 days 3 baby aspirin daily and then should have continued with an 81 mg aspirin at this time. Dr. Otto Herb wants to refer Cindee Lame this the patient's angiography in 6 months at that time with a CT angiogram to see if interval changes if any have happened he may consider a stent placement. I have shared these  notes with the primary care Billee Cashing, PA with the ophthalmologist Dr. Fawn Kirk and with Dr. Sanjuan Dame the patient's endocrinologist. I will give him today an additional handout guidance with the DASH diet which is not just a cardiovascular but a general vascular health benefit, early signs of stroke and how to respond to these. I would like for him not to hesitate to present to the emergency room if symptoms of a stroke may be present. Any clumsiness weakness numbness in the right body or left face would be considered reason to come to the emergency room.   07-10-2020; RV after EMG and NCV were"  Normal",- mild chronic interossoeus denervation- no widespread neuropathy.   c spine MRI was normal - arm and shoulder soreness persisted. Vision is main concern- and he has spoken to and seen Dr Luciana Axe- who believes this is neurological. We are not meeting today for any sleepiness concern he has endorsed the Epworth sleepiness score at 5.  I record here from Dr. [name], MD reason for visit was abnormal vision changes the visit took place on 25 May 2020 at 2 PM nuclear sclerosis OU, lattice degeneration of the retinal OD, proliferation of diabetic neuropathy neuropathy OU.  Ocular complications of uncontrolled diabetes type 2 in the past, pseudotumor cerebri history optic papillitis OU, retinal hemorrhages in the past OU.  When the patient woke up in the morning he saw black spots he saw pulsations and noticed these he also reports a circular ring around his  visual field with little flashing lights some pulsations and spots of green vision green tinted visual field.  There are also some gray spots that may reflect areas of previous laser surgery.  Both eyes dilated well that Dr. [name] had a clear view of the posterior segment and noticed possible mild disc edema on both sides mild hyperemia on the right eye a boat shaped the age the left eye was clear, scattered dot blot hemorrhages no lipid on  the right side some preretinal fibrosis on the left side he made a note of panretinal photo coagulation for the right eye scattered left wrist degeneration.  The anterior eyes were intact the concern is a ganglion cell loss superiorly in the right eye this inferior scotoma he will repeat the exam in 6 weeks which would be mid September patent mid-October.  There is a new disc finding that he could compare when he could compare his photographic evidence.  Dr. [name] evaluation from 8-19 2021 showed microaneurysms but no clinically significant macular edema good laser photocoagulation of the sites of prior neovascularization superior to the nerve.  No retinal holes or tears.  Dr. Luciana Axe also quoted the observation of continuing to see a ring while grayness now it should certainly reflects his center of his vision in the right eye.  This grayness has been reported now as turning somewhat mean.  This appears to be an otic nerve insult- he sees green, lost red vision- he needs evaluation of the optic nerve - rule out compression. May need to have new spinal tap.        02-23-2020 Patient was seen in a video conference in 01-2020 and is now back face-to -face.  He reports good controll of diabetes and continued to work through the pandemic.  We need refills for INPH, he is on his last refill. He still has asthmatic issues, but he feels these have been not out of control. He consumes neither chocolate nor caffeine and has not noted tremor.     12-15-2018, I see [name] here today with his mother- he has retro-orbital pain at times, more often in AM- and he has had many more asthma attacks, culminating in chest pain and presentation to ED-I is supposed to have MAB infusions for allergy control but was too sick to have the regular infusion one in 30 days- Has been too sick to get the weekly immunotherapy.  DM follow up documented fair control of glucose.    Just seen by allergy/ asthma physician Dr Lucie Leather on  11-29-2018;  .  I quote: " If it does look like acetazolamide is creating significant problems then he will need to make some tough choices about how to address his pseudotumor cerebri as apparently this condition was threatening his vision.  In retrospect when looking back through his respiratory tract problems they really did increase in intensity and frequency since starting his acetazolamide." We decided to reduce the diamox to daily 250 mg and if asthma is still unabashed , will need to switch to TPM and repeat CSF release.   06-09-2018, I have the pleasure of meeting with Kathreen Devoid.  Madole today, he recently had seen Dr. Alveda Reasons dental office and underwent a home sleep test they are which has the same results essentially that he had here in an attended polysomnography from 18 June 2017.  Prior to our test he had a home sleep test at [facility] and was told based on those results that he did  have obstructive sleep apnea which I could not confirm.  Neither could Dr. Alveda ReasonsFuller's home sleep test.  The patient however is a loud snorer and our concern was that the dental device may help him to reduce or treat the snoring.  The dental referral was not based on the presence of obstructive sleep apnea.  He also qualified that the sleep study from Dr. Alveda ReasonsFuller's office on 24 May 2018 was performed while he had an upper respiratory infection, could not rest and breathes well at baseline and could especially not rest flat and needed several pillows to support him.  He did not have tachybradycardia arrhythmias, he did not have oxygen desaturations of significance, so we are left with discussing how to treat the snoring. insurance will not pay for snoring based dental device.   Robbie LouisGaby is also not excessively daytime sleepy and endorsed the Epworth Sleepiness Scale at 8 points, the  fatigue severity score at 58/ 63  points. He is excessively fatigued and this may relate to his DM type 1. He no longer has  symptoms of pseudotumor on diamox, has to drink more not to dehydrate. He used to see neon green while he has optic nerve pressure- red vision was unimpaired. He no longer has the green ray in his visual field.  Much less tinnitus on higher diamox dose. Here for refills.       Interval history from 25 Mar 2018, Mr. Posey ProntoGabriel Compean is here today for follow-up visit, he has tolerated the LP very well, his opening pressure was elevated he was asked to discontinue the tetracycline medication and instead started on Diamox.  He reports that both headaches and vision have been much improved, sometimes in the morning he still has a hazy greenish tinge to his visual field the dose away once he takes his medication.  Much less frequent has since been observed in the afternoon or evenings.  He has not to take another antibiotic as his urine sample to look clear and his urologist felt that he was not in need of further therapy. Dr Fransico MichaelBrennan checked his glucometer readings against his pump, and they tracked well. He plans to travel by plane soon and had additional questions about the ICP and diamox. No longer ptosis on the right - resolved right after LP. He has not returned to the gym- where he noted both ptosis and greenish vision.  He was sick after a trip to [place]- explained by altitude exacerbation.  Sleep apnea    Avoiding tetracycline, retinoid, high dose vit A . Avoid high altitudes without diamox premedication.   use diamox defore you fly .     03-12-2018,   I have the pleasure of seeing Mr. Posey ProntoGabriel Wilmot today in a follow-up visit.  I had seen the patient on 14 May when he presented with very concerning data from his ophthalmologist, retro-orbital pressure was identified, and he was from here referred after physical and neurological exam for an immediate fluoroscopic lumbar puncture.  [facility], a successful nontraumatic spinal tap  was obtained, between the second and third lumbar vertebra opening pressure was 28 cmH2O.  Except for an elevated glucose level in the CSF there was no other abnormality, no elevated protein no white blood cells or red cells and all cultures thus far have been negative.  After the spinal tap developed abdominal almost spasms, as well as cramping at the chest wall muscles.  By day 3 post spinal tap he has developed typical spinal tap headaches that arise when he sits up or walks sometimes needing about 10 to 15 minutes.  He feels much better laying flat and laying reclining.  In order to treat the spinal tap headaches I will send him for a blood patch, urinary tract infection was ruled out his urinary sample to clean I was concerned since I discontinued his antibiotics.  I still think that the tetracyclines probably tipped the intracranial pressure.  He saw Dr. Luciana Axe his retinal specialist yesterday, they were pleased with the results as to his retinal changes, any further procedure from ophthalmology will be pending until gait feels better has less headaches and discomfort.  I also started Diamox at 500 mg twice daily should he developed gastrointestinal side effects from this medication I would like in that case to reduce it to 250 in the morning 250 at night and we can still advance the dose later on after a couple of weeks or so.    03-09-2018, Patient returned for an urgent visit based on vision changes and diabetic changes to the eye, seen by Dr. Luciana Axe.  He developed headaches , changes to green tinged vision, some tremor and HA are worse when he in bed, better when he rises. Dynamic intracranial pressure changes He gets nauseated, and worse vision changes when moving. He feels a vibration and tension in the neck and pressure behind the right eye more than left, he sleeps on his right. History of chronic sinusitis, with tinnitus since 10/ 2017 . He tested negative for OSA. He had initially improved in  HA, tinnitus and Vertigo. Tremor could not be objectively seen.  He has been a diabetic Type 1, followed by Dr.  Molli Knock, with an insulin pump.  MRI brain and sinus CT in the last 6 month. Seen by ENT and allergy specialist.  His upper respiratory infects have decreased, since allergy treatment.   Topical vancomycin and extensive notes over, the last visit with the patient was on May 9 Thursday 2019, the patient had been treated with Avastin injections into the left and right eye, beginning in August 2018 the last right eye injection was on November 09, 2017.  Patient is currently on doxycycline, Arnuity, Humalog insulin, Klonopin, losartan and montelukast.  Impression was that the patient has controlled diabetes type 1 diabetes mellitus with right eye affected by proliferative retinopathy and macular edema Dr. Luciana Axe documented that he felt this was worsening.  He also stated the same for the left eye also to a slightly lesser degree.  There is also a minor cataract forming in both eyes, there is optic papillitis which has not been graded, ocular pain, and a history of acute sinusitis and retinal hemorrhage he mentions the patient snores and has headaches with valsalva-.  Transient obscuration of vision in both eyes- last  MRI scan was normal at the time/ 2018.   Headaches much worse when bending over, body habitus and doxycycline medication may cause this patient's likely intracranial pressure elevation.  ENT cleared him of sinusitis: CLINICAL DATA:  Chronic sinusitis   08-31-2017, Mr. Jorgeluis Rung is seen here today for a follow-up visit after his sleep study from 06/18/2017.  The patient again had such a mild degree of apnea that it does not need to be  treated with any intervention.  His AHI was only 2.6/h not considered pathologically.  His REM sleep AHI was 14.3/h and supine AHI 3.4.  We meet  today to also look at his sleep experience since the study.  The patient reports that he still wakes up  with trembling in both arms, he has shoulder discomfort which keeps him from sleeping on his side.  However in intervention with CPAP, PLM medication or oxygen is not necessary at this point.  I do not have a good understanding as to why he trembles, and why these trembling spells wake him from sleep.  He has been followed closely for his thyroid function and reports that it has been in the high normal range.  He still reports some palpitations that he experiences at night.  He also reports facial twitching at night. He related the onset to psychiatric medication ( wellbutrin, trazodone). He has year around asthma and allergies.  I have no interventional therapies available.    CONSULT : SLEEP CLINIC:  Yousof Alderman is a 39 y.o. male , seen here as in a referral/ revisit  from sports medicine and Dr Fransico Michael, ped endocrinologist.  I would like to thank Dr. Fransico Michael for his report notes which helped me today to design further sleep evaluation and treatment for this patient. He also gave me an excellent summary of the past medical problems that Mr. Cowgill has faced. Mr.  Blasingame was diagnosed with diabetes at age 11 and has been followed by a pediatric endocrinologist, Dr. Fransico Michael. His blood sugars have been better and more stable on an auto mode Medtronic insulin pump. He also faces problems with obesity, hypertension, hyperlipidemia and an autonomic neuropathy with tachycardia and gastroparesis. As blood pressure control had improved so did his heart rate and his postprandial bloating. He has been treated for thyroiditis consistent with an evolving Hashimoto's ultra immune thyroid disease. He was euthyroid again in March 2018 when last checked he had reported fatigue and was diagnosed by a home sleep test last year at " [facility]", was  supposedly diagnosed with obstructive sleep apnea but had not treatment initiated .  He has proliferative retinopathy, attributed to diabetes. Dr. Fransico Michael also wanted to  make sure that current rather newish neuromuscular symptoms beI evaluated -he was especially concerned that this patient is prone to further auto-immune diseases such as central demyelination or multiple sclerosis. Dr. Darrick Penna in this sport medicine section of [facility] had added in this visit on 04/15/2007 but the patient habits and improvement of nausea, vertigo, after discontinuing eyedrops, trazodone and Wellbutrin and atorvastatin. He had more headaches. He felt jittery and tremors of his right hand and right leg were temporary worse. He describes photophobia. He felt shaking more in the morning l right hand and right leg, an inner jitteriness. It has improved. Vertigo not present today.    Sleep habits are as follows:  " Bedtime between 10 and 12 midnight, rises at 7 AM. He describes his sleep is rather restless, trying to find a comfortable sleep position, but she doesn't necessarily use the bed during the night. He does not report nocturia, and only twice or so in the last month has he woke up with palpitations or clamminess, likely related to hypoglycemia. He is known to snore, and he had a home sleep test at [facility] last year in [facility] and was told that he has obstructive sleep apnea. Treatment was not initiated yet. He wakes up not feeling restored or refreshed, and he has noticed that his jittery illness affecting the right extremities and a feeling of inner trembling of his torso is the worst in  the morning hours. It doesn't last that long. He also has a pulsating change of vision when he first gets up and he suffered from vertigo for much of the last 3 months. No daytime naps, he wouldn't sleep at night if he did.   Sleep medical history and family sleep history:  Dad snoring, mom is an insomniac. He remembers having problems sleeping even in childhood. He has felt fatigued and not restored for several months. Social history: works as Games developer. The patient is  single, no children. No tobacco use, seldom alcohol use,  4 months ago he stopped drinking caffeine due to the jitteriness, and headaches with photophobia and a feeling of the head being full could also be relating to sinusitis.    Review of Systems: Out of a complete 14 system review, the patient complains of only the following symptoms, and all other reviewed systems are negative.       Social History   Socioeconomic History  . Marital status: Single    Spouse name: Not on file  . Number of children: Not on file  . Years of education: Not on file  . Highest education level: Not on file  Occupational History  . Not on file  Tobacco Use  . Smoking status: Never Smoker  . Smokeless tobacco: Never Used  Vaping Use  . Vaping Use: Never used  Substance and Sexual Activity  . Alcohol use: No  . Drug use: No  . Sexual activity: Not on file  Other Topics Concern  . Not on file  Social History Narrative   ** Merged History Encounter **       Social Determinants of Health   Financial Resource Strain:   . Difficulty of Paying Living Expenses: Not on file  Food Insecurity:   . Worried About Programme researcher, broadcasting/film/video in the Last Year: Not on file  . Ran Out of Food in the Last Year: Not on file  Transportation Needs:   . Lack of Transportation (Medical): Not on file  . Lack of Transportation (Non-Medical): Not on file  Physical Activity:   . Days of Exercise per Week: Not on file  . Minutes of Exercise per Session: Not on file  Stress:   . Feeling of Stress : Not on file  Social Connections:   . Frequency of Communication with Friends and Family: Not on file  . Frequency of Social Gatherings with Friends and Family: Not on file  . Attends Religious Services: Not on file  . Active Member of Clubs or Organizations: Not on file  . Attends Banker Meetings: Not on file  . Marital Status: Not on file  Intimate Partner Violence:   . Fear of Current or Ex-Partner: Not  on file  . Emotionally Abused: Not on file  . Physically Abused: Not on file  . Sexually Abused: Not on file    Family History  Problem Relation Age of Onset  . Dementia Paternal Aunt   . Schizophrenia Paternal Aunt   . Cancer Maternal Grandmother   . Diabetes Maternal Grandmother        T2 DM  . Cancer Maternal Grandfather   . Heart disease Father   . Thyroid disease Neg Hx     Past Medical History:  Diagnosis Date  . ADHD (attention deficit hyperactivity disorder)   . Asthma   . Autonomic neuropathy due to diabetes (HCC)   . Diabetes (HCC)   . Fatigue   . Goiter   .  High blood pressure   . High cholesterol   . Hypercholesterolemia   . Hypertension   . Hypoglycemia associated with diabetes (HCC)   . Obesity   . Tachycardia   . Type 1 diabetes mellitus not at goal [facility])   . Uncontrolled DM with microalbuminuria or microproteinuria     Past Surgical History:  Procedure Laterality Date  . EYE SURGERY Right 05/30/2020   Vitrectomy, Dr. Luciana Axe  . IR 3D INDEPENDENT WKST  07/16/2020  . IR ANGIO INTRA EXTRACRAN SEL COM CAROTID INNOMINATE BILAT MOD SED  07/16/2020  . IR ANGIO VERTEBRAL SEL SUBCLAVIAN INNOMINATE UNI L MOD SED  07/16/2020  . IR ANGIO VERTEBRAL SEL VERTEBRAL UNI R MOD SED  07/16/2020  . IR US GUIDE VASC ACCESS RIGHT  07/16/2020  . REFRACTIVE SURGERY     x9  . WISDOM TOOTH EXTRACTION      Current Outpatient Medications  Medication Sig Dispense Refill  . acetaminophen (TYLENOL) 500 MG tablet Take 1,000 mg by mouth 2 (two) times daily as needed.     [name] acetaZOLAMIDE (DIAMOX) 500 MG capsule TAKE 2 CAPSULES BY MOUTH IN THE MORNING AND 1 IN THE EVENING 90 capsule 0  . albuterol (PROAIR HFA) 108 (90 Base) MCG/ACT inhaler Inhale 2 puffs into the lungs every 6 (six) hours as needed for wheezing or shortness of breath. 1 Inhaler 2  . albuterol (PROAIR HFA) 108 (90 Base) MCG/ACT inhaler Inhale 2 puffs into the lungs every 4 (four) hours as needed for wheezing or shortness  of breath. 18 g 1  . albuterol (PROVENTIL) (2.5 MG/3ML) 0.083% nebulizer solution Take 3 mLs (2.5 mg total) by nebulization every 4 (four) hours as needed for wheezing or shortness of breath. 75 mL 1  . ALPRAZolam (XANAX) 0.25 MG tablet Take by mouth.    Christie Beckers HFA 200 MCG/ACT AERO INHALE 2 PUFFS INTO THE LUNGS TWICE DAILY 13 g 5  . aspirin EC 81 MG tablet Take 81 mg by mouth daily. Swallow whole.    [name] atorvastatin (LIPITOR) 20 MG tablet Take 20 mg by mouth daily.   0  . AUVI-Q 0.3 MG/0.3ML SOAJ injection Use as directed for life-threatening allergic reaction. 2 each 2  . budesonide (PULMICORT) 0.5 MG/2ML nebulizer solution Take 2 mLs (0.5 mg total) by nebulization as directed. 2 mL 3  . budesonide-formoterol (SYMBICORT) 160-4.5 MCG/ACT inhaler Inhale 2 puffs into the lungs 2 (two) times daily. 1 Inhaler 5  . DULoxetine (CYMBALTA) 30 MG capsule Take 1 capsule (30 mg total) by mouth daily. 30 capsule 1  . EPINEPHrine (AUVI-Q) 0.3 mg/0.3 mL IJ SOAJ injection Use as directed for severe allergic reactions 2 each 1  . glucagon 1 MG injection Follow package directions for low blood sugar. 1 each 3  . HUMALOG 100 UNIT/ML injection INJECT 300 UNITS UNDER THE SKIN VIA INSULIN PUMP EVERY 48 TO 72 HOURS PER HYPERGLYCEMIA AND DKA PROTOCOLS 120 mL 5  . levocetirizine (XYZAL) 5 MG tablet Take 5 mg by mouth daily as needed for allergies.     [name] losartan (COZAAR) 50 MG tablet TAKE 1 TABLET(50 MG) BY MOUTH DAILY 90 tablet 1  . methocarbamol (ROBAXIN) 500 MG tablet Take 1 tablet (500 mg total) by mouth 2 (two) times daily. 60 tablet 1  . montelukast (SINGULAIR) 10 MG tablet TAKE 1 TABLET(10 MG) BY MOUTH AT BEDTIME 30 tablet 0  . mupirocin ointment (BACTROBAN) 2 % mupirocin 2 % topical ointment  APP EXT IEN BID    .  NUCALA 100 MG/ML SOAJ INJECT 100MG  SUBCUTANEOUSLY EVERY 30 DAYS (GIVEN AT MD  OFFICE) 1 mL 11  . omeprazole (PRILOSEC) 20 MG capsule Take 20 mg by mouth daily as needed (for reflux).      ULTRA test strip USE TO CHECK BLOOD SUGAR 6 TIMES DAILY AS DIRECTED 200 strip 5  . prednisoLONE acetate (PRED FORTE) 1 % ophthalmic suspension Place 1 drop into the right eye 4 (four) times daily. 10 mL 0  . topiramate (TOPAMAX) 50 MG tablet TAKE 1 TABLET(50 MG) BY MOUTH TWICE DAILY 60 tablet 5   Current Facility-Administered Medications  Medication Dose Route Frequency Provider Last Rate Last Admin  . Mepolizumab SOLR 100 mg  100 mg Subcutaneous Q28 days [name], MD   100 mg at 02/16/19 1018   Facility-Administered Medications Ordered in Other Visits  Medication Dose Route Frequency Provider Last Rate Last Admin  . gadopentetate dimeglumine (MAGNEVIST) injection 20 mL  20 mL Intravenous Once PRN Worth Kober, 02/18/19, MD        Allergies as of 07/19/2020 - Review Complete 07/19/2020  Allergen Reaction Noted  . Flonase [fluticasone] Shortness Of Breath 06/10/2017  . Molds & smuts Other (See Comments) 03/09/2018  . Sulfites Itching and Swelling 02/04/2018  . Tetracyclines & related Other (See Comments) 03/09/2018  . White birch Cough 04/10/2020    Vitals: BP 112/75   Pulse 89   Ht 6' (1.829 m)   Wt 228 lb (103.4 kg)   BMI 30.92 kg/m  Last Weight:  Wt Readings from Last 1 Encounters:  07/19/20 228 lb (103.4 kg)   07/21/20 mass index is 30.92 kg/m.     Last Height:   Ht Readings from Last 1 Encounters:  07/19/20 6' (1.829 m)    Physical exam:  General: The patient is awake, alert and appears not in acute distress. The patient is well groomed. Head: Normocephalic, atraumatic. Neck is supple. Mallampati 5-  ,  neck circumference:21. Nasal airflow patent , TMJ is  Not  evident . Retrognathia is not seen. Full facial hair.  Cardiovascular:  Regular rate and rhythm , without  murmurs or carotid bruit, and without distended neck veins. Respiratory:  Voice is hoarse, used inhaler right before visit. He has wheezing and had rhonchi., has been on steroids, antibiotics.    Skin:  Without evidence of edema, or rash Trunk: BMI is elevated. The patient's posture is erect   Neurologic exam : The patient is awake and alert, oriented to place and time.   Memory subjective described as intact.  Attention span & concentration ability appears normal.  Speech is fluent, without dysarthria, dysphonia or aphasia.  Mood and affect are appropriate.  Cranial nerves: Pupils are equal and briskly reactive to light. Pale retinae bilaterally, yellow tinged - has neovascular proliferation in both eyes. Laser surgical scars visible.  Extraocular movements in vertical and horizontal planes  without nystagmus.  Hearing to finger rub intact. Facial sensation intact to fine touch. Facial motor  symmetric and tongue / uvula move midline. Shoulder shrug was symmetrical.  Motor exam:  Normal tone, muscle bulk and symmetric strength in all extremities. He has less tension over the neck, worked with PT.  Sensory:  Fine touch, pinprick normal in feet , but he has less often a feeling of vibration in feet and hands. Coordination: Rapid alternating movements normal without evidence of ataxia, dysmetria or tremor. He is restless NO TREMOR seen here today . His handwriting is at baseline.  Gait and station: Patient walks without assistive device . Strength within normal limits.  Stance is stable and normal.  Turns with 3 steps.   Deep tendon reflexes: in the upper and lower extremities are symmetric and intact. Babinski maneuver response is  downgoing.  Assessment:  After physical and neurologic examination, review of laboratory studies,  Personal review of imaging studies, reports of other /same  Imaging studies, results of polysomnography and / or neurophysiology testing and pre-existing records as far as provided in visit., my assessment is   19 minutes :   1) MRA showing left ICA intracranial stenosis.  I will cancel the spinal tap after his angiogram- showed left ICA stenosis ( 70%)  We  started aspirin and he will continue to control his cholesterol with atorvastatin.   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  No longer empty sella - no need for LP.  asking Dr. Pearlean Brownie for a second opinion.   Re angiography with Dr. Corliss Skains in 6 month.   Diabetes and DASH diet -  . Stroke prevention measures. stroke warning symptoms.       Melvyn Novas, MD 07/19/2020, 12:34 PM  Certified in Neurology by ABPN Certified in Sleep Medicine by [facility] [address], [address] [place], [place] [address]

## 2020-07-23 ENCOUNTER — Ambulatory Visit: Payer: 59 | Admitting: Neurology

## 2020-07-23 NOTE — Telephone Encounter (Signed)
Good morning , Dr. Luciana Axe. Our mutual patient, [name], asked if AVASTIN injections could have had an effect on vascularization/ ischaemic optic nerve disease. I had not been able to answer that.   Can you enlighten me and [name]?   Thank you, Melvyn Novas, MD

## 2020-07-24 ENCOUNTER — Ambulatory Visit: Payer: 59 | Admitting: Family Medicine

## 2020-07-24 ENCOUNTER — Other Ambulatory Visit: Payer: Self-pay

## 2020-07-24 ENCOUNTER — Encounter: Payer: Self-pay | Admitting: Family Medicine

## 2020-07-24 VITALS — BP 120/76 | HR 87 | Temp 97.9°F | Resp 16 | Ht 72.0 in | Wt 224.4 lb

## 2020-07-24 DIAGNOSIS — J3089 Other allergic rhinitis: Secondary | ICD-10-CM | POA: Diagnosis not present

## 2020-07-24 DIAGNOSIS — J302 Other seasonal allergic rhinitis: Secondary | ICD-10-CM | POA: Diagnosis not present

## 2020-07-24 DIAGNOSIS — K219 Gastro-esophageal reflux disease without esophagitis: Secondary | ICD-10-CM | POA: Diagnosis not present

## 2020-07-24 DIAGNOSIS — J4540 Moderate persistent asthma, uncomplicated: Secondary | ICD-10-CM | POA: Diagnosis not present

## 2020-07-24 MED ORDER — LEVOCETIRIZINE DIHYDROCHLORIDE 5 MG PO TABS: 5.0000 mg | ORAL_TABLET | Freq: Every day | ORAL | 5 refills | Status: DC | PRN

## 2020-07-24 MED ORDER — EPINEPHRINE 0.3 MG/0.3ML IJ SOAJ
INTRAMUSCULAR | 1 refills | Status: DC
Start: 2020-07-24 — End: 2020-08-16

## 2020-07-24 MED ORDER — MONTELUKAST SODIUM 10 MG PO TABS
ORAL_TABLET | ORAL | 5 refills | Status: DC
Start: 2020-07-24 — End: 2021-01-14

## 2020-07-24 NOTE — Patient Instructions (Addendum)
Asthma Continue montelukast 10 mg once a day to prevent cough or wheeze Continue albuterol 2 puffs every 4 hours as needed for cough or wheeze Continue self-administration of Nucala 100 mg once every 4 weeks and have access to an epinephrine auto-injector set For asthma flares, begin Asmanex 200-2 puffs once to twice a day depending on disease activity.  Allergic rhinitis Continue allergen avoidance measures directed toward tree pollen and molds as listed below Continue Xyzal 5 mg once a day as needed for a runny nose or itch.  Continue saline nasal rinses as needed for nasal symptoms  Reflux Continue dietary and lifestyle modifications to control reflux Continue omeprazole 20 mg once a day as needed for reflux  Get an influenza vaccine before October 31  Call the clinic if this treatment plan is not working well for you  Follow up in 6 months or sooner if needed.  Reducing Pollen Exposure The American Academy of Allergy, Asthma and Immunology suggests the following steps to reduce your exposure to pollen during allergy seasons. 1. Do not hang sheets or clothing out to dry; pollen may collect on these items. 2. Do not mow lawns or spend time around freshly cut grass; mowing stirs up pollen. 3. Keep windows closed at night.  Keep car windows closed while driving. 4. Minimize morning activities outdoors, a time when pollen counts are usually at their highest. 5. Stay indoors as much as possible when pollen counts or humidity is high and on windy days when pollen tends to remain in the air longer. 6. Use air conditioning when possible.  Many air conditioners have filters that trap the pollen spores. 7. Use a HEPA room air filter to remove pollen form the indoor air you breathe.  Control of Mold Allergen Mold and fungi can grow on a variety of surfaces provided certain temperature and moisture conditions exist.  Outdoor molds grow on plants, decaying vegetation and soil.  The major  outdoor mold, Alternaria and Cladosporium, are found in very high numbers during hot and dry conditions.  Generally, a late Summer - Fall peak is seen for common outdoor fungal spores.  Rain will temporarily lower outdoor mold spore count, but counts rise rapidly when the rainy period ends.  The most important indoor molds are Aspergillus and Penicillium.  Dark, humid and poorly ventilated basements are ideal sites for mold growth.  The next most common sites of mold growth are the bathroom and the kitchen.  Outdoor [employer] 8. Use air conditioning and keep windows closed 9. Avoid exposure to decaying vegetation. 10. Avoid leaf raking. 11. Avoid grain handling. 12. Consider wearing a face mask if working in moldy areas.  Indoor Mold Control 1. Maintain humidity below 50%. 2. Clean washable surfaces with 5% bleach solution. 3. Remove sources e.g. Contaminated carpets.

## 2020-07-24 NOTE — Progress Notes (Addendum)
[address] [place] [address] Dept: [phone]  FOLLOW UP NOTE  Patient ID: [name], male    DOB: 31-Aug-1981  Age: 39 y.o. MRN: [id number] Date of Office Visit: 07/24/2020  Assessment  Chief Complaint: Asthma (No concerns at this time, not using inhalers and wants them off med list)  HPI [name] is a 39 year old male who presents to the clinic for a follow-up visit.  He was last seen in this clinic on 03/15/2019 via telephone by Dr. Lucie Leather for evaluation of asthma, allergic rhinitis, and reflux.  At today's visit, he reports asthma has been well controlled with no shortness of breath, cough, or wheeze with activity or rest.  He does note that he frequently stays at home due to Covid and always wears a mask while outside.  He continues montelukast 10 mg once a day and self-administers Nucala 100 mg once every 30 days.  He reports he has not used albuterol or Asmanex in over 6 months.  Allergic rhinitis is reported as well controlled with intermittent postnasal drainage occurring mostly in the morning which began in the fall season.  He continues Xyzal 5 mg one time a month which is on the day that he takes his Nucala injection.  His nasal symptoms are well controlled without the need for budesonide rinses.  Reflux is reported as well controlled without the need for omeprazole.  His current medications are listed in the chart.   Drug Allergies:  Allergies  Allergen Reactions  . Flonase [Fluticasone] Shortness Of Breath  . Molds & Smuts Other (See Comments)    Aggravate asthma  . Sulfites Itching and Swelling  . Tetracyclines & Related Other (See Comments)    increased intracranial pressure  . White Birch Cough    Physical Exam: BP 120/76   Pulse 87   Temp 97.9 F (36.6 C)   Resp 16   Ht 6' (1.829 m)   Wt 224 lb 6.4 oz (101.8 kg)   SpO2 98%   BMI 30.43 kg/m    Physical Exam Vitals reviewed.  Constitutional:      Appearance: Normal  appearance.  HENT:     Head: Normocephalic and atraumatic.     Right Ear: Tympanic membrane normal.     Left Ear: Tympanic membrane normal.     Nose:     Comments: Bilateral nares slightly erythematous with no nasal drainage.  Pharynx normal.  Ears normal.  Eyes normal.    Mouth/Throat:     Pharynx: Oropharynx is clear.  Eyes:     Conjunctiva/sclera: Conjunctivae normal.  Cardiovascular:     Rate and Rhythm: Normal rate and regular rhythm.     Heart sounds: Normal heart sounds. No murmur heard.   Pulmonary:     Effort: Pulmonary effort is normal.     Breath sounds: Normal breath sounds.     Comments: Lungs clear to auscultation Musculoskeletal:        General: Normal range of motion.     Cervical back: Normal range of motion and neck supple.  Skin:    General: Skin is warm and dry.  Neurological:     Mental Status: He is alert and oriented to person, place, and time.  Psychiatric:        Mood and Affect: Mood normal.        Thought Content: Thought content normal.        Judgment: Judgment normal.     Diagnostics: FVC 4.46, FEV1  3.66.  Predicted FVC 5.69, predicted FEV1 4.53.  Spirometry indicates mild restriction.  This is consistent with previous spirometry readings.  Assessment and Plan: 1. Moderate persistent asthma without complication   2. Seasonal and perennial allergic rhinitis   3. Gastroesophageal reflux disease, unspecified whether esophagitis present     Meds ordered this encounter  Medications  . montelukast (SINGULAIR) 10 MG tablet    Sig: TAKE 1 TABLET(10 MG) BY MOUTH AT BEDTIME    Dispense:  30 tablet    Refill:  5  . EPINEPHrine (AUVI-Q) 0.3 mg/0.3 mL IJ SOAJ injection    Sig: Use as directed for severe allergic reactions    Dispense:  2 each    Refill:  1  . levocetirizine (XYZAL) 5 MG tablet    Sig: Take 1 tablet (5 mg total) by mouth daily as needed for allergies.    Dispense:  30 tablet    Refill:  5  . Mometasone Furoate (ASMANEX HFA) 200  MCG/ACT AERO    Sig: For asthma flare, begin Asmanex 200-2 puffs twice a day for 1-2 weeks or until cough and wheeze free    Dispense:  13 g    Refill:  5    Please hold. Patient will call when needed. Thank you    Patient Instructions  Asthma Continue montelukast 10 mg once a day to prevent cough or wheeze Continue albuterol 2 puffs every 4 hours as needed for cough or wheeze Continue self-administration of Nucala 100 mg once every 4 weeks and have access to an epinephrine auto-injector set For asthma flares, begin Asmanex 200-2 puffs once to twice a day depending on disease activity.  Allergic rhinitis Continue allergen avoidance measures directed toward tree pollen and molds as listed below Continue Xyzal 5 mg once a day as needed for a runny nose or itch.  Continue saline nasal rinses as needed for nasal symptoms  Reflux Continue dietary and lifestyle modifications to control reflux Continue omeprazole 20 mg once a day as needed for reflux  Get an influenza vaccine before October 31  Call the clinic if this treatment plan is not working well for you  Follow up in 6 months or sooner if needed.   Return in about 6 months (around 01/21/2021), or if symptoms worsen or fail to improve.    Thank you for the opportunity to care for this patient.  Please do not hesitate to contact me with questions.  Thermon Leyland, FNP [facility]  I have provided oversight concerning Thermon Leyland' evaluation and treatment of this patient's health issues addressed during today's encounter. I agree with the assessment and therapeutic plan as outlined in the note.   Signed,   Jessica Priest, MD,  Allergy and Immunology,  [facility].

## 2020-07-25 ENCOUNTER — Encounter: Payer: Self-pay | Admitting: Family Medicine

## 2020-07-25 MED ORDER — ASMANEX HFA 200 MCG/ACT IN AERO
INHALATION_SPRAY | RESPIRATORY_TRACT | 5 refills | Status: DC
Start: 2020-07-25 — End: 2021-08-27

## 2020-07-26 ENCOUNTER — Inpatient Hospital Stay: Admission: RE | Admit: 2020-07-26 | Payer: 59 | Source: Ambulatory Visit

## 2020-07-30 ENCOUNTER — Telehealth: Payer: Self-pay | Admitting: Family Medicine

## 2020-07-30 NOTE — Telephone Encounter (Signed)
Spoke with [employer] they needed directions use for auvi-q as they are handling some of the aspn rx's currently answered their question

## 2020-07-30 NOTE — Telephone Encounter (Signed)
[facility] called with questions about a prescription. [phone] Option 3

## 2020-07-31 ENCOUNTER — Ambulatory Visit: Payer: 59 | Admitting: Neurology

## 2020-08-04 ENCOUNTER — Other Ambulatory Visit: Payer: Self-pay | Admitting: Neurology

## 2020-08-08 LAB — HM DIABETES EYE EXAM

## 2020-08-14 ENCOUNTER — Ambulatory Visit: Payer: 59 | Admitting: Neurology

## 2020-08-16 ENCOUNTER — Other Ambulatory Visit: Payer: Self-pay

## 2020-08-16 ENCOUNTER — Ambulatory Visit (INDEPENDENT_AMBULATORY_CARE_PROVIDER_SITE_OTHER): Payer: 59 | Admitting: "Endocrinology

## 2020-08-16 ENCOUNTER — Encounter (INDEPENDENT_AMBULATORY_CARE_PROVIDER_SITE_OTHER): Payer: Self-pay | Admitting: "Endocrinology

## 2020-08-16 VITALS — BP 130/80 | HR 84 | Wt 223.8 lb

## 2020-08-16 DIAGNOSIS — E10649 Type 1 diabetes mellitus with hypoglycemia without coma: Secondary | ICD-10-CM

## 2020-08-16 DIAGNOSIS — E049 Nontoxic goiter, unspecified: Secondary | ICD-10-CM | POA: Diagnosis not present

## 2020-08-16 DIAGNOSIS — I6522 Occlusion and stenosis of left carotid artery: Secondary | ICD-10-CM

## 2020-08-16 DIAGNOSIS — E1065 Type 1 diabetes mellitus with hyperglycemia: Secondary | ICD-10-CM

## 2020-08-16 DIAGNOSIS — E1043 Type 1 diabetes mellitus with diabetic autonomic (poly)neuropathy: Secondary | ICD-10-CM

## 2020-08-16 DIAGNOSIS — E083593 Diabetes mellitus due to underlying condition with proliferative diabetic retinopathy without macular edema, bilateral: Secondary | ICD-10-CM

## 2020-08-16 DIAGNOSIS — E782 Mixed hyperlipidemia: Secondary | ICD-10-CM

## 2020-08-16 DIAGNOSIS — M542 Cervicalgia: Secondary | ICD-10-CM

## 2020-08-16 DIAGNOSIS — Z23 Encounter for immunization: Secondary | ICD-10-CM | POA: Diagnosis not present

## 2020-08-16 DIAGNOSIS — R4584 Anhedonia: Secondary | ICD-10-CM

## 2020-08-16 LAB — POCT GLUCOSE (DEVICE FOR HOME USE): POC Glucose: 215 mg/dl — AB (ref 70–99)

## 2020-08-16 LAB — POCT GLYCOSYLATED HEMOGLOBIN (HGB A1C): Hemoglobin A1C: 6.2 % — AB (ref 4.0–5.6)

## 2020-08-16 NOTE — Progress Notes (Signed)
Subjective:  Patient Name: [name] Date of Birth: 12-02-1980  MRN: [id number]  [name]  presents at his clinic visit today for follow-up of his type 1 diabetes mellitus, goiter, obesity, combined hyperlipidemia, adult ADHD, fatigue, autonomic neuropathy, tachycardia, hypertension, microalbuminuria, hypoglycemia, sinusitis, bronchitis, allergies, proliferative retinopathy, and pseudotumor cerebri/increased intracranial hypertension.   HISTORY OF PRESENT ILLNESS:   [name] is a 39 y.o. Caucasian gentleman. [name] was unaccompanied.  1. The patient was first referred to me on 01/08/2006 by his family physician, Dr. Andreas [name], for evaluation and management of type 1 diabetes mellitus and related problems. The patient was 39 years old.  A. [name] had been diagnosed with type 1 diabetes somewhere in 2001-2002, at the age of 22-19. He was initially diagnosed with type 2 diabetes mellitus, but was later re-classified as type 1 diabetes mellitus. He had been followed by Dr. Romilda [name], staff endocrinologist at [facility]. [name] was started on an insulin pump approximately 18 months prior to first seeing me. His pump was a Medtronic Paradigm 712. His blood glucose control was fair-to-poor. He was frequently not checking blood sugars or taking insulin boluses as often as he needed to. He frequently noted fast heart rate. The patient's past medical history was positive for hypertension, for which he was taking lisinopril, and for what his mother called "extreme ADHD". He had previously been taking ADHD medicines but had discontinued them due to adverse effects. He had prior ankle injuries and knee injuries, to include tears of the lateral menisci bilaterally. He had not had any surgeries. 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 As a result, the patient was on the road a lot and spent much of his time outdoors at  field sites. He did not use tobacco or drugs, but did drink alcohol occasionally.  B. On physical examination, his weight was 234 pounds, his height was 70 inches, his BMI was 33.2. Blood pressure was 128/70. Hemoglobin A1c was 9.5%. Heart rate was 96. He had normal affect and fair insight. He had a 25-30 gram goiter. He had normal 1+ DP pulses in his feet. He had normal sensation in his feet to touch, vibration, and monofilament. His CMP was normal except for glucose of 251. His cholesterol was 257, triglycerides 143, HDL 49, and LDL 179. TSH was 1.017, free T4 was 1.06, and free T3 was 3.3. His urinary microalbumin: creatinine ratio was 20.1 (normal less than 30).   C. The patient clearly needed better blood glucose control, which would only occur if he had better adherence to his plan. He appeared to have autonomic neuropathy, manifested by inappropriate sinus tachycardia.  His thyroid goiter suggested that he might have evolving Hashimoto's disease. He stated that he had lost some weight recently. I encouraged him to check his blood sugars more frequently and to take both correction boluses and food boluses.  2. During the past fourteen years, [name] has had to deal with many different problems:  A. T1DM/hypoglycemia:    1). 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 His hemoglobin A1c values had varied from 8.3-11.2%.   2). After starting him on a Medtronic 670G insulin pump and Guardian 3 sensor on 01/12/17, his BG control has improved significantly, despite the illnesses described below.  B. DM complications: .   1). He has bilateral diabetic retinopathy and is followed by his retinologist, Dr. Terance Hart, MD.     A). He saw Dr. Zadie [name] in October 2020 and again in February 2021. There were no new signs of diabetic eye disease.     2). At [facility]  visit with me in July 2021 he asked me to arrange a second opinion retinal consultation for him. I arranged for him to see Dr. Dennard [name] in the [place].    2). He has autonomic neuropathy manifested by inappropriate sinus tachycardia and gastroparesis causing postprandial bloating and GERD. These problems have varied inversely with his degree of BG control.   C. Combined hyperlipidemia:     1). On 12/03/10 his total cholesterol was 270, triglycerides 94, HDL 50, and LDL 201. I started him on Crestor, 10 mg per day. He has since begun treatment with atorvastatin and his cholesterol values had improved.    2). On 12/29/16 his lipids had improved, but his cholesterol values were still elevated. After developing the illnesses noted below, his atorvastatin was discontinued due to concerns that he might be having adverse reactions to the atorvastatin.    D. Allergies, sinusitis, bronchitis, headaches, muscle pains/spasms:   1). Unfortunately, beginning in about June 2018, [name] began to develop recurrent episodes of allergic rhinitis, head fullness, sinusitis, and bronchitis. He also developed headaches, muscle pains and spasms, lethargy, fatigue, and weakness that were presumably due in large part to severe allergies. His atorvastatin and several other medications were stopped at that time. Despite receiving excellent care from his allergist, to include allergy injections and biologic injections, [name]'s allergic issues persisted.    2). He saw Dr. Carmelina [name] on 03/15/19 and again in February 2021. [name]'s allergies and asthma have been okay since has been wearing his covid mask. He has not needed steroids recently. His second sleep study was not bad enough to start on C-pap. In the past 8-10 months these illnesses/problems have largely resolved, in part due to taking a monthly biologic allergy injection.   E. Increased intracranial pressure:    1). [name] was seen in consultation by Dr. Larey [name], neurology, on  05/28/17 for headaches, tremors of his right hand and foot, shaky vision, and symptoms c/w OSA. His initial sleep study did not show any significant amount of OSA.    2). On 03/08/18, Dr.Dohmeier was notified that [place] eye doctor had seen signs of fluid build up in his right eye. An LP was performed in radiology on 03/09/18. Opening pressure was 28 cm of water, c/w increased intracranial pressure and pseudotumor cerebri. This problem was initially though to be due to tetracycline usage, so TCN was discontinued and Diamox begun. The Diamox doses have been changed over time.    3). On 01/03/19 he had an LP that showed elevated pressure. His diamox dose was increased somewhat. Dr. Brett Fairy also started him on Topamax. He had a follow up visit with Dr. Brett Fairy on 02/23/20. She continued him on low doses of both medications. He still has some adverse effects of the Diamox, but not as bad. He still has some mild fatigue.  G. Adult ADD/depression: [name] was evaluated by Dr Carlena Hurl, a noted neuropsychiatrist in [place] in 2018. At his last follow up visit in November 2018, Dr. Wylene Simmer decided to wait until [name]'s overall health situation  stabilized before trying any new medications.   H. General physical and emotional health: [name] has been very sick  for much of the past 2 years. He has been unable to work full-time and has become almost confined to his home. He has become increasingly anhedonic over time.   I. Neck pains, decreased cervical range of motion, and shoulder girdle pains:   1). Several months ago he began to develop pains in his left upper chest and shoulder area. He saw Dr. Oneida Alar. The pains then went into his right upper chest and shoulder, then into his shoulder blades. Now he can't bend his neck. [place] of his left shoulder was negative.    2). He missed his follow up appointment with Dr. Oneida Alar due to other conflicting medical appointments. I asked him to re-schedule that appointment.  [place] last [facility] visit was on 05/14/2020. At that visit we continued his insulin pump settings. [name] was supposed to return to see me in 2 months, but did not.   A. In the interim he has not been well.   B. He continues to have neck and shoulder girdle pains, as well as tem;poral headaches. I suggested that he re-schedule his appointment with Dr. Oneida Alar.  C. He continues to see his therapist, Ms. [facility], every week. He is using alprazolam, 0.25 mg at bedtime when needed for anxiety. His PCP will prescribe the medication for him.  He thinks the medication and the therapy are helping him.   D. His nausea only occurs occasionally. He does not usually take omeprazole.   E. He continues to have problems with lactose intolerance. When he avoids lactose, however, he is asymptomatic. Some foods such as shellfish cause his throat to swell up.   F. He uses Humalog aspart insulin in his insulin pump. His BGs have been pretty good. He takes losartan, 50 mg/day. He rarely has any coughing unless his allergies act up. His PCP put him back on atorvastatin several months ago.   G. He saw Dr [name] on 06/27/20.  His eyes looked relatively fine. She gave him an injection in his left eye. He appreciated her consultation, but has difficulty obtaining transportation back and forth to [place], so wants to continue with his retinologist, Dr. Zadie [name], here in [place].   H. He had an MRI and an MRA of his brain. The MRI of the brain and brainstem down to C3 was normal. The MRA showed the following:  Moderate stenosis of the M1 segment of the left middle cerebral artery.  Negligible flow through the A1 segment of the left anterior cerebral artery.  This could be due to a hypoplastic/aplastic segment, a normal variant.  This could also be due to severe stenosis.  The left A2 segment derived this flow from the right to through the anterior communicating artery a "moderate stenosis of the M1 segment of the left middle  cerebral artery: He then had an angiography procedure to evaluate his bilateral carotid  And innominate arterial circulation. That study showed: Approximately 65-70% stenosis of the left internal carotid artery supraclinoid segment secondary to a mildly irregular intracranial arteriosclerotic plaque. No opacification of the left anterior cerebral A1 segment most likely represents a pathologic intracranial occlusion. 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 Approximately 10 mm x 8.3 mm pyramidal configured high-riding jugular bulb. [name] was told to" take 325 mg of ASA daily as a secondary stroke prevention strategy".  I. Following  the angiography procedure he developed bilateral palmar hand numbness. Fortunately, these symptoms are improving   J. He has an asthma consultation with Ms Juliann Pares, [place], on 07/24/20. He had mild restrictive disease, essentially unchanged.  K. He had an evaluation by Dr. Celine Ahr, [facility], on 06/19/20 and a follow up visit on 10.01/21. The cardiologist recommended continuing his  ASA and statin therapy, but did not fele any cardiology follow up was needed    4. Pertinent Review of Systems: Constitutional: [name] feels "exhausted physically and emotionally". His sleep quality is not good, due to pain and other neurologic symptoms.   Eyes: As above. The visual color changes, muscle spasms, and tingling that were attributed to Diamox now bother him only "occasionally" since reducing the Diamox dose. Neck: He has occasionally had intermittent anterior neck swelling and tenderness.  Lungs: Breathing has been "alright", but he still sometimes has SOB with exertion.  Heart: He thinks his heart beats about the same. Gastrointestinal: As above. He does not have much postprandial bloating or reflux. when he takes his  omeprazole. He also does not have no other complaints of excessive hunger, heartburn, upset stomach, stomach aches or pains, or diarrhea while taking omeprazole. Unfortunately, when he feels better, he stops taking the omeprazole.  Hands: As above Legs: As above. He still has spasms of the larger muscles of his legs, but at other times they feel like they are vibrating.  These sensations may last for up to a few hours. Muscle mass and strength seem normal. There are no other complaints of numbness, tingling, burning, or pain. No edema is noted. Feet: There are no obvious foot problems now. He still has episodic foot pains, but not often. There are no complaints of numbness, or burning.  No edema is noted. Hypoglycemia: He has not had many low BG alerts.  Emotional: "Not good"    5. Pump printout: He changes sites every 2-3 days. Average BG is 162, compared with 168 at his last visit.  He had one bad site that did not work from the start, so he had to put in a new site.   6. CGM printout: He has been in auto mode 96%. He wears his sensor 96%. Average SG is 148, compared with 149 at his last visit. Time in Range is 84%. Time above range is 16%. SGs average about 160 at midnight. SGs decrease to about 110 in the mornings. SGs increase after lunch and after dinner. [name] says he wishes ha had had this technology 10-20 years ago.   PAST MEDICAL, FAMILY, AND SOCIAL HISTORY:  Past Medical History:  Diagnosis Date  . ADHD (attention deficit hyperactivity disorder)   . Asthma   . Autonomic neuropathy due to diabetes ([place])   . Diabetes ([place])   . Fatigue   . Goiter   . High blood pressure   . High cholesterol   . Hypercholesterolemia   . Hypertension   . Hypoglycemia associated with diabetes ([place])   . Obesity   . Tachycardia   . Type 1 diabetes mellitus not at goal [facility])   . Uncontrolled DM with microalbuminuria or microproteinuria     Family History  Problem Relation Age of Onset  . Dementia  Paternal Aunt   . Schizophrenia Paternal Aunt   . Cancer Maternal Grandmother   . Diabetes Maternal Grandmother        T2 DM  . Cancer Maternal Grandfather   . Heart disease Father   .  Thyroid disease Neg Hx      Current Outpatient Medications:  .  acetaZOLAMIDE (DIAMOX) 500 MG capsule, TAKE 2 CAPSULES BY MOUTH EVERY MORNING AND ONE IN THE EVENING, Disp: 90 capsule, Rfl: 1 .  ALPRAZolam (XANAX) 0.25 MG tablet, Take by mouth., Disp: , Rfl:  .  aspirin EC 81 MG tablet, Take 81 mg by mouth daily. Swallow whole., Disp: , Rfl:  .  atorvastatin (LIPITOR) 20 MG tablet, Take 20 mg by mouth daily. , Disp: , Rfl: 0 .  HUMALOG 100 UNIT/ML injection, INJECT 300 UNITS UNDER THE SKIN VIA INSULIN PUMP EVERY 48 TO 72 HOURS PER HYPERGLYCEMIA AND DKA PROTOCOLS, Disp: 120 mL, Rfl: 5 .  levocetirizine (XYZAL) 5 MG tablet, Take 1 tablet (5 mg total) by mouth daily as needed for allergies., Disp: 30 tablet, Rfl: 5 .  losartan (COZAAR) 50 MG tablet, TAKE 1 TABLET(50 MG) BY MOUTH DAILY, Disp: 90 tablet, Rfl: 1 .  montelukast (SINGULAIR) 10 MG tablet, TAKE 1 TABLET(10 MG) BY MOUTH AT BEDTIME, Disp: 30 tablet, Rfl: 5 .  NUCALA 100 MG/ML SOAJ, INJECT $RemoveBefo'100MG'[id number]$  SUBCUTANEOUSLY EVERY 30 DAYS (GIVEN AT MD  OFFICE), Disp: 1 mL, Rfl: 11 .  ONETOUCH ULTRA test strip, USE TO CHECK BLOOD SUGAR 6 TIMES DAILY AS DIRECTED, Disp: 200 strip, Rfl: 5 .  topiramate (TOPAMAX) 50 MG tablet, TAKE 1 TABLET(50 MG) BY MOUTH TWICE DAILY, Disp: 60 tablet, Rfl: 5 .  acetaminophen (TYLENOL) 500 MG tablet, Take 1,000 mg by mouth 2 (two) times daily as needed.  (Patient not taking: Reported on 08/16/2020), Disp: , Rfl:  .  albuterol (PROAIR HFA) 108 (90 Base) MCG/ACT inhaler, Inhale 2 puffs into the lungs every 6 (six) hours as needed for wheezing or shortness of breath. (Patient not taking: Reported on 08/16/2020), Disp: 1 Inhaler, Rfl: 2 .  albuterol (PROAIR HFA) 108 (90 Base) MCG/ACT inhaler, Inhale 2 puffs into the lungs every 4 (four) hours as  needed for wheezing or shortness of breath. (Patient not taking: Reported on 07/24/2020), Disp: 18 g, Rfl: 1 .  albuterol (PROVENTIL) (2.5 MG/3ML) 0.083% nebulizer solution, Take 3 mLs (2.5 mg total) by nebulization every 4 (four) hours as needed for wheezing or shortness of breath. (Patient not taking: Reported on 07/24/2020), Disp: 75 mL, Rfl: 1 .  AUVI-Q 0.3 MG/0.3ML SOAJ injection, Use as directed for life-threatening allergic reaction. (Patient not taking: Reported on 08/16/2020), Disp: 2 each, Rfl: 2 .  budesonide (PULMICORT) 0.5 MG/2ML nebulizer solution, Take 2 mLs (0.5 mg total) by nebulization as directed. (Patient not taking: Reported on 07/24/2020), Disp: 2 mL, Rfl: 3 .  budesonide-formoterol (SYMBICORT) 160-4.5 MCG/ACT inhaler, Inhale 2 puffs into the lungs 2 (two) times daily. (Patient not taking: Reported on 07/24/2020), Disp: 1 Inhaler, Rfl: 5 .  DULoxetine (CYMBALTA) 30 MG capsule, Take 1 capsule (30 mg total) by mouth daily. (Patient not taking: Reported on 08/16/2020), Disp: 30 capsule, Rfl: 1 .  glucagon 1 MG injection, Follow package directions for low blood sugar. (Patient not taking: Reported on 08/16/2020), Disp: 1 each, Rfl: 3 .  methocarbamol (ROBAXIN) 500 MG tablet, Take 1 tablet (500 mg total) by mouth 2 (two) times daily. (Patient not taking: Reported on 08/16/2020), Disp: 60 tablet, Rfl: 1 .  Mometasone Furoate [facility] HFA) 200 MCG/ACT AERO, For asthma flare, begin Asmanex 200-2 puffs twice a day for 1-2 weeks or until cough and wheeze free (Patient not taking: Reported on 08/16/2020), Disp: 13 g, Rfl: 5 .  mupirocin ointment (BACTROBAN)  2 %, mupirocin 2 % topical ointment  APP EXT IEN BID (Patient not taking: Reported on 08/16/2020), Disp: , Rfl:  .  nystatin (MYCOSTATIN) 100000 UNIT/ML suspension, Take by mouth. (Patient not taking: Reported on 08/16/2020), Disp: , Rfl:  .  omeprazole (PRILOSEC) 20 MG capsule, Take 20 mg by mouth daily as needed (for reflux).  (Patient not  taking: Reported on 08/16/2020), Disp: , Rfl:   Current Facility-Administered Medications:  [name]  Mepolizumab SOLR 100 mg, 100 mg, Subcutaneous, Q28 days, Valentina Shaggy, MD, 100 mg at 02/16/19 1018  Facility-Administered Medications Ordered in Other Visits:  .  gadopentetate dimeglumine (MAGNEVIST) injection 20 mL, 20 mL, Intravenous, Once PRN, Dohmeier, Asencion Partridge, MD  Allergies as of 08/16/2020 - Review Complete 08/16/2020  Allergen Reaction Noted  . Flonase [fluticasone] Shortness Of Breath 06/10/2017  . Molds & smuts Other (See Comments) 03/09/2018  . Sulfites Itching and Swelling 02/04/2018  . Tetracyclines & related Other (See Comments) 03/09/2018  . White birch Cough 04/10/2020    1. Work and Family: He can no longer work part-time at home. 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  2. Activities: He walks every day, but is in pain.  3. Smoking, alcohol, or drugs: He has not been drinking any alcohol or beer. No tobacco or drugs. 4. Primary Care Provider: He has a new PCP, Ms Mattie Marlin, PA, [facility].  5. Neurology: Dr. Brett Fairy 6. Ophthalmology:  7. Retinology: Dr. Zadie [name] 8. Urology: Dr. Jeffie Pollock at [facility] 9. Sports medicine: Dr. Oneida Alar 10. Therapy: Ms Rea College, RN, MSN  REVIEW OF SYSTEMS: There are no other significant problems involving Lucy's other body systems.   Objective:  Vital Signs:  BP 130/80   Pulse 84   Wt 223 lb 12.8 oz (101.5 kg)   BMI 30.35 kg/m  heart rate 88   Ht Readings from Last 3 Encounters:  07/24/20 6' (1.829 m)  07/19/20 6' (1.829 m)  07/16/20 6' (1.829 m)   Wt Readings from Last 3 Encounters:  08/16/20 223 lb 12.8 oz (101.5 kg)  07/24/20 224 lb 6.4 oz (101.8 kg)  07/19/20 228 lb (103.4 kg)   PHYSICAL EXAM:  Constitutional: The patient appears overweight, tired, and antalgic today. He has lost 11 pounds since his last visit. His affect is fairly normal. His insight  is good. He engaged fairly well today.  Eyes: There is no arcus or proptosis.  Mouth: The oropharynx appears normal. The tongue appears normal. There is normal oral moisture. There is no obvious gingivitis. Neck: There are no bruits present. The thyroid gland appears normal. The thyroid gland is again mildly enlarged at about 21 grams in size. The consistency of the thyroid gland is normal. There is no thyroid tenderness to palpation. His cervical ROM is poor.  Lungs: The lungs are clear. Air movement is good. Heart: The heart rhythm and rate appear normal. Heart sounds S1 and S2 are normal. I do not appreciate any pathologic heart murmurs. Abdomen: The abdomen is obese. Bowel sounds are normal. The abdomen is soft and non-tender. There is no obviously palpable hepatomegaly, splenomegaly, or other masses.  Arms: Muscle mass appears appropriate for age.  Hands: There is no obvious tremor. Phalangeal and metacarpophalangeal joints appear normal. Palms are normal. Sensation to touch is somewhat diminished in his proximal palms.  Legs: Muscle mass appears appropriate for age. There is no edema.  Feet: There are no significant deformities. Dorsalis pedis  pulses are normal bilaterally.  Neurologic: CN II- XII intact. Muscle strength is 4/5 due to antalgia in his UEs, but 5/5 in the LEs. Muscle tone appears normal. Sensation to touch is normal in the legs and feet.   LAB DATA:   Labs 08/16/20: HbA1c 6.2%, CBG 215  Labs 05/14/20: HbA1c 6.5%, CBG 176; TSH 2.44, free T4 1.1, free T3 3.5; urinary microalbumin/creatinine ratio 2 (ref <30)  Labs 04/17/20: ESR 6 (ref 0-15), CK 82 (ref 49-439)  Labs 07/12/19: HbA1c 6.4%, CBG 231  Labs 04/26/19: HbA1c 6.8%, CBG 230  Labs 12/30/18: TSH 2.07, free T4 1.1, free T3 3.2, TPO antibody <1, thyroglobulin antibody <1; CMP normal, except glucose 191; cholesterol 165, triglycerides 138, HDL 33, LDL 106; urinary microalbumin/creatinine ratio 5; vitamin B1 10 (ref 8-30),  vitamin B6 3.4 (ref 2.1-21.7), vitamin B12 451 (ref 314 [id number] 1029)  Labs 11/29/18: CBG 179  Labs 11/29/18: CMP with glucose 198, chloride 109 (ref 96-106, CO2 17 (ref 20-29); CBC with WBC 11.6 (ref 3.4-10.8), PMNs 8.6 (ref 1.4-7.0), and monocytes 1.0 (ref 0.1-0.9)  Labs 10/28/18: HbA1c 6.9%, CBG 155  Labs 10/06/18: ANA titer negative, ANCA titers negative, rheumatoid factor negative, cyclic citrullin peptide 7 (ref 0-190  Labs 07/27/18: CBG 193  Labs 05/31/18: HbA1c 6.6%, CBG 202  Labs 03/18/18: CBG 178  Labs 01/13/18: HbA1c 6.8%, CBG 194; TSH 0.98, free T4 1.3; free T3 3.6; CMP normal except glucose 191; urinary microalbumin/creatinine ratio 35 (ref <30)  Labs 11/17/17: CBG 170  Labs 10/13/17: HbA1c 6.8%, CBG 191  Labs 08/24/17: CBG 117  Labs 07/20/17: CBG 216  Labs 07/08/17: IgA, IgG, and IgM normal. CBC normal.  Labs 06/16/17: HbA1c 6.5%, CBG 193  Labs 05/15/17: CBG 209; TSH 0.90, free T4 1.2, free T3 3.8  Labs 03/05/17: HbA1c 7.3%, CBG 241  Labs 02/20/17: CBG 180 fasting  Labs 01/29/17: CBG 148  Labs 01/12/17: CBG 116  Labs 12/29/16: HbA1c 9.1%, CBG 223; TSH 0.62, free T4 1.1, free T3 3.5; urine microalbumin/creatinine ratio 22; cholesterol 171, triglycerides 93, HDL 40, LDL 112;  CMP glucose 201  Labs 09/26/16: HbA1c 9.5%  Labs 08/13/16: Celiac panel negative; CMP normal except for glucose 207; CBC normal  Labs 06/13/16: HbA1c 9.8%; LH 3.6, FSH 2.3, testosterone 533 (ref 250-827), free testosterone 59.4 (ref 47-244)  Labs 02/11/16: HbA1c 9.2%  Labs 01/07/16: HbA1c 10.5%; CBC with Hgb elevated at 17.2%; LH 3.9, FSH 2.0; TSH 1.53, free T4 1.3, free T3 3.7; cholesterol 262, triglycerides 171, HDL 40, LDL 188; urinary microalbumin/creatinine ratio 31; CMP normal except for glucose 181  Labs 06/29/15. HbA1c 9.6%  Labs 12/28/14: Hemoglobin A1c 9.5% today, compared with 9.5%, at last visit and with 9.9% at the prior visit.  Labs 08/03/14: HbA1c 9.5%; TSH 0.548,free T4 1.10, free T3 3.7,  TPO antibody < 1, TSI 30; CMP normal except glucose 191; urinary microalbumin/creatinine ratio 27; cholesterol 260, triglycerides 90, HDL 51, LDL 191   Labs 10/05/13: HbA1c 9.9%  Labs 04/23/13: CMP normal, except glucose 144; cholesterol 216, triglycerides 97, HDL 39, LDL 158; urinary microalbumin/creatinine ratio 6.9; TSH 0.505, free T4 1.17, free T3 3.8  Labs: 01/21/12: TSH 1.251, free T4 1.10, free T3 3.6, CBC normal, CMP normal except for glucose 206, urinary microalbumin/creatinine ratio 6.3, cholesterol 187, triglycerides 67, HDL 45, LDL 129   Assessment and Plan:   ASSESSMENT:  1. T1DM:   A. His SGs have been mostly <200, reflecting fairly good DM.  His HBA1c is 6.2%, without having many low SGs.  BChester [name] has done very well at trying to optimize his BG control. This level of BG control without significant hypoglycemia is a testament to the power of modern insulin pump-CGM technology and to [name]'s determination to keep his BGs under control    2. Hypoglycemia: He has not had many SGs <70. 3. Hypertension: His DBP is elevated today, presumably due to pain and to not sleeping.    4. Combined hyperlipidemia:   A. His lipids in March 2017 were too high, but the lipids in March 2018 were much better on atorvastatin. He discontinued the atorvastatin in mid-2018 due to concern that this medication might be causing his neuromuscular symptoms. His lipids in March 2020 were better.   B. Ms Frederico Hamman re-started his atorvastatin several months ago. We need to obtain a fasting lipid panel.  5. Autonomic neuropathy with tachycardia and gastroparesis: His gastroparesis has improved in parallel with his BG improvement. His heart rate has improved as well. .  6. Obesity: Weight has decreased. He is trying to eat healthier. He has also probably lost some muscle.    7-8. Goiter/thyroiditis:   A. His thyroid gland had shrunk back to normal size at a previous visit, but had increased slightly at his last visit  and remains slightly enlarged today. His thyroiditis is clinically quiescent.   B. The active thyroiditis that he has had in the past and the  process of waxing and waning of thyroid gland size are c/w evolving Hashimoto's thyroiditis.   C. He was euthyroid in March 2013, but was borderline hyperthyroid in June 2014. He was euthyroid in October 2015, March 2017, March 2018, July 2018, in March 2019, in March 2020, and in July 2021.  9. Fatigue: This problem is much worse today..   10. Obstructive sleep apnea: He did not meet the criteria for OSA, but because of his snoring a dental appliance was suggested. I recommended that he obtain a dental appliance. Unfortunately, his former dentist no longer accepts [employer], so [name] has to find a new dentist. 50. Adult ADD: He had an appointment with Dr. Carlena Hurl, MD in [place] in November 2018. Dr. Wylene Simmer is waiting for [name]'s medical situation to stabilize before beginning any new psych meds.   12. Proliferative diabetic retinopathy: As above.  13. URI/nasal congestion/vertigo/allergic rhinitis and conjunctivitis: He is not having these problems today. The monthly injections have really helped him.   14. Neuromuscular symptoms and headaches/increased intracranial pressure:   A. It appeared previously that some of [name]'s headaches were classic tension headaches. When he performed cervical stretching exercises the headache resolved.   B. In the past when Dr. Brett Fairy evaluated [name] for his vague neuromuscular symptoms, she did not find any signs of any serious neurologic disease. His MRI of his brain was unremarkable. At that time his neuromuscular symptoms had resolved.  C. Later, however, the headaches worsened and some of the neuromuscular symptoms recurred. We now know that he had increased ICP and that his headaches improved for a brief time after the LP. He then developed a typical spinal tap headache.  D. In retrospect he may have  had intermittent problems with elevated ICP previously.  E. Dr. Brett Fairy suspects that [name]'s ICP was due to the doxycycline. She will follow this issue over time.   F. He appeared to have problems taking his prior dose of Diamox, but is doing better on the combination of a lower dose of Diamox and Topamax.  15. Calcified  lymph node: It is possible that he may have contracted a fungus while he was working in the [employer]. several years ago. His thyroid US showed his goiter, but no intrinsic thyroid abnormality. A partially calcified cervical lymph node on the right was noted, c/w a post infections/inflammatory node. 16. Microalbuminuria: His ratio was mildly elevated in March 2019. He needed tighter BG and BP control. His ratio in March 2020 and again in July 2021 was very good.  17. Anhedonia: [name] is depressed today. He feels better, but wants to be normal. His sessions with Ms Rea College, RN, MSN, a psychiatric nurse specialist, have been very beneficial. He still needs to find more sources of fun.  18. Neck pain/Cervical neuritis/neuromuscular pain: [name] has seen Dr. Oneida Alar several times. Dr. Oneida Alar had planned to obtain a neck xray if [name]'s neck did not improve. Unfortunately, [name] missed his lat appointment with Dr. Oneida Alar due to conflicting medical appointment.  I asked [name] to re-schedule his appointment with Dr. Oneida Alar.   19. Cerebral arteriosclerosis: It is unclear to me whether the lesions seen on angiography and MRA are amenable to vascular surgical techniques. I put in a referral today to vascular surgery for a consultation.   PLAN:  1. Diagnostic:  Repeat HbA1c and CBG at his next visit. Repeat lipid panel.  2. Therapeutic: Follow DM care plan. Continue losartan, ASA, and atorvastatin. Continue his current pump basal rates, ISFs, ICRs, and BG targets.   Basal rates: basal 1 MN:1.20 6 AM: 1.90 2 PM: 1.60 8 PM: 1.65   ISFs: 35   ICRs: MN: 7 8 AM: 4.5 11 AM: 4.5 9 PM:  7   BG targets:  MN: 130-150 6 AM: 120 10 PM 130-150  3. Patient education:   A. We discussed all of the above at great length. We also discussed the need for him to continue to be followed closely by Ms. Frederico Hamman and Drs Osborne Casco, Kozlow, Dohmeier, Rankin, Cira Servant, Ms Hanska, and Dr. Wylene Simmer when he can again do so. .   B.  I recommended he contact Sports Medicine today.  4. Follow-up:  2 months   Level of Service: This visit lasted in excess of 75 minutes. More than 50% of the visit was devoted to researching [name]'s record, examining him, and counseling him.  Tillman Sers, MD, CDE Adult and Pediatric Endocrinology

## 2020-08-16 NOTE — Patient Instructions (Signed)
Follow up visit in 2 months. Obtain a fasting lipid panel.

## 2020-08-17 ENCOUNTER — Other Ambulatory Visit (INDEPENDENT_AMBULATORY_CARE_PROVIDER_SITE_OTHER): Payer: Self-pay | Admitting: "Endocrinology

## 2020-08-23 ENCOUNTER — Encounter (INDEPENDENT_AMBULATORY_CARE_PROVIDER_SITE_OTHER): Payer: 59 | Admitting: Ophthalmology

## 2020-08-30 ENCOUNTER — Telehealth (INDEPENDENT_AMBULATORY_CARE_PROVIDER_SITE_OTHER): Payer: Self-pay | Admitting: "Endocrinology

## 2020-08-30 NOTE — Telephone Encounter (Signed)
°  Who's calling (name and relationship to patient) : Jeo with [employer]  Best contact number: [phone] Option [phone]  Provider they see: Dr. Fransico Michael  Reason for call: Yates Decamp is calling to check the status of CMN that was faxed on Nov. 1 for pump supplies.    PRESCRIPTION REFILL ONLY  Name of prescription:  Pharmacy:

## 2020-08-31 NOTE — Telephone Encounter (Signed)
Spoke with usan at [employer]. Faxing papers today. We received them yesterday and they were signed today.

## 2020-09-11 ENCOUNTER — Other Ambulatory Visit: Payer: Self-pay

## 2020-09-11 ENCOUNTER — Ambulatory Visit (INDEPENDENT_AMBULATORY_CARE_PROVIDER_SITE_OTHER): Payer: 59 | Admitting: Sports Medicine

## 2020-09-11 ENCOUNTER — Encounter (INDEPENDENT_AMBULATORY_CARE_PROVIDER_SITE_OTHER): Payer: 59 | Admitting: Ophthalmology

## 2020-09-11 ENCOUNTER — Encounter: Payer: Self-pay | Admitting: Sports Medicine

## 2020-09-11 VITALS — BP 110/64 | Ht 72.0 in | Wt 221.0 lb

## 2020-09-11 DIAGNOSIS — M542 Cervicalgia: Secondary | ICD-10-CM

## 2020-09-11 NOTE — Assessment & Plan Note (Signed)
At our previous visit we were going to recommend he seek help with psychiatry if he had not improved.  Given that he has been dealing with acute new finding of intracranial internal carotid artery stenosis we will give him time to follow-up with the several specialist that he is seen about this to decide how he much further he wants to pursue this.  Still highly suspect depression/anxiety is playing a major role in these symptoms.  However he did have evidence of tendinopathy at the insertion of the sternocleidomastoid muscle on the right and he is on a statin which can cause this.  Recommended isometric neck exercises and Voltaren gel follow-up with [place] as needed.

## 2020-09-11 NOTE — Patient Instructions (Signed)
It was great to see you today! Thank you for letting me participate in your care!  Today, we discussed your neck pain and facial twitching. Part of your neck pain is coming from a tendinopathy of the sternocleidomastoid muscle as it inserts on the bone. Please try an over the counter gel called Voltaren gel and use it on the area up to 4 times per day. Please also do the stretches we gave you.  Be well, Jules Schick, DO PGY-4, Sports Medicine Fellow [facility]

## 2020-09-11 NOTE — Progress Notes (Signed)
    SUBJECTIVE:   CHIEF COMPLAINT / HPI:   Chronic neck pain [name] is a 39 year old male who presents today for follow-up due to chronic neck pain.  He states since he was last seen in our clinic he has not started Cymbalta and he feels like he is not doing any better and improving his sleep pattern.  The neck pain is close to the same may be more localized on the right side located at the insertion of the sternocleidomastoid and worse when flexing toward that side.  He has noticed this change in the past 3-4 weeks.  Of note he did have some vision changes and was being seen and had follow-up with his physician and an MRA of the head and neck was ordered that did show a left intracranial internal carotid artery stenosis of 65-70%.  He is following up with ophthalmology, neurology, and vascular surgery about this.  He also thinks that this is related to some facial twitching which seems to be bothering him.  Given his most recent problem of intracranial and internal carotid artery stenosis did not want to introduce any new medicines which we thought was reasonable  PERTINENT  PMH / PSH: Hypertension, type 1 diabetes, GERD,Pseudotumor cerebri  OBJECTIVE:   BP 110/64   Ht 6' (1.829 m)   Wt 221 lb (100.2 kg)   BMI 29.97 kg/m   [facility] 09/11/2020  Frequency of aerobic exercise (# of days/week) 5  Average time in minutes 30  Frequency of strengthening activities (# of days/week) 0   Cervical spine:  - Inspection: no gross deformity or asymmetry, swelling or ecchymosis. No skin changes - Palpation: No TTP over the spinous processes, TTP over the insertion of the right sternocleidomastoid, no other paraspinal muscle tenderness, or SI joints b/l - ROM: full active ROM of the cervical spine in flexion and extension, rotation, and side bending without pain - Strength: 5/5 strength of upper extremity distributions b/l  - Neuro: grossly intact  ASSESSMENT/PLAN:    Cervicalgia of occipito-atlanto-axial region At our previous visit we were going to recommend he seek help with psychiatry if he had not improved.  Given that he has been dealing with acute new finding of intracranial internal carotid artery stenosis we will give him time to follow-up with the several specialist that he is seen about this to decide how he much further he wants to pursue this.  Still highly suspect depression/anxiety is playing a major role in these symptoms.  However he did have evidence of tendinopathy at the insertion of the sternocleidomastoid muscle on the right and he is on a statin which can cause this.  Recommended isometric neck exercises and Voltaren gel follow-up with [place] as needed.     Arlyce Harman, DO PGY-4, Sports Medicine Fellow [facility]  I observed and examined the patient with the [facility] resident and agree with assessment and plan.  Note reviewed and modified by me. Sterling Big, MD

## 2020-09-12 ENCOUNTER — Ambulatory Visit (INDEPENDENT_AMBULATORY_CARE_PROVIDER_SITE_OTHER): Payer: 59 | Admitting: Ophthalmology

## 2020-09-12 ENCOUNTER — Encounter (INDEPENDENT_AMBULATORY_CARE_PROVIDER_SITE_OTHER): Payer: Self-pay | Admitting: Ophthalmology

## 2020-09-12 ENCOUNTER — Other Ambulatory Visit: Payer: Self-pay

## 2020-09-12 DIAGNOSIS — E103553 Type 1 diabetes mellitus with stable proliferative diabetic retinopathy, bilateral: Secondary | ICD-10-CM

## 2020-09-12 DIAGNOSIS — H4311 Vitreous hemorrhage, right eye: Secondary | ICD-10-CM | POA: Diagnosis not present

## 2020-09-12 DIAGNOSIS — H2513 Age-related nuclear cataract, bilateral: Secondary | ICD-10-CM | POA: Insufficient documentation

## 2020-09-12 DIAGNOSIS — H43822 Vitreomacular adhesion, left eye: Secondary | ICD-10-CM | POA: Diagnosis not present

## 2020-09-12 DIAGNOSIS — H469 Unspecified optic neuritis: Secondary | ICD-10-CM

## 2020-09-12 DIAGNOSIS — E103591 Type 1 diabetes mellitus with proliferative diabetic retinopathy without macular edema, right eye: Secondary | ICD-10-CM

## 2020-09-12 NOTE — Assessment & Plan Note (Addendum)
Status post PRP OU, in the past patient had diabetic papillopathy at the in the right eye, which improved somewhat, but final resolution did not occur until patient's undiagnosed pseudotumor was fundamentally treated and controlled, at my insistence of work-up.  OS, some room for PRP anteriorly, yet no active neovascularization detected,

## 2020-09-12 NOTE — Progress Notes (Signed)
09/12/2020     CHIEF COMPLAINT Patient presents for Retina Follow Up   HISTORY OF PRESENT ILLNESS: [name] is a 39 y.o. male who presents to the clinic today for:   HPI    Retina Follow Up    Patient presents with  Other.  In both eyes.  This started 2 months ago.  Severity is mild.  Duration of 2 months.  Since onset it is gradually worsening.          Comments    2 Month F/U OU  Pt sts he is seeing "funkiness" OD x 8-9 days. Pt sts he has been seeing green and red lights OD. Pt c/o soreness off and on OU, but also pain OD "the other day." Pt sts he has been seeing "dots" OD off and on. Pt sts VA OU is "fairly clear." Pt reports "a little bit of blurriness" in the AM only OD. VA OS stable.       Last edited by Ileana Roup, COA on 09/12/2020  9:09 AM. (History)      Referring physician: [name], PA-C [address] [place],  [place] [address]  HISTORICAL INFORMATION:   Selected notes from the MEDICAL RECORD NUMBER    Lab Results  Component Value Date   HGBA1C 6.2 (A) 08/16/2020     CURRENT MEDICATIONS: No current outpatient medications on file. (Ophthalmic Drugs)   No current facility-administered medications for this visit. (Ophthalmic Drugs)   Current Outpatient Medications (Other)  Medication Sig  . acetaminophen (TYLENOL) 500 MG tablet Take 1,000 mg by mouth 2 (two) times daily as needed.  (Patient not taking: Reported on 08/16/2020)  . acetaZOLAMIDE (DIAMOX) 500 MG capsule TAKE 2 CAPSULES BY MOUTH EVERY MORNING AND ONE IN THE EVENING  . albuterol (PROAIR HFA) 108 (90 Base) MCG/ACT inhaler Inhale 2 puffs into the lungs every 6 (six) hours as needed for wheezing or shortness of breath. (Patient not taking: Reported on 08/16/2020)  . albuterol (PROAIR HFA) 108 (90 Base) MCG/ACT inhaler Inhale 2 puffs into the lungs every 4 (four) hours as needed for wheezing or shortness of breath. (Patient not taking: Reported on 07/24/2020)  .  albuterol (PROVENTIL) (2.5 MG/3ML) 0.083% nebulizer solution Take 3 mLs (2.5 mg total) by nebulization every 4 (four) hours as needed for wheezing or shortness of breath. (Patient not taking: Reported on 07/24/2020)  . ALPRAZolam (XANAX) 0.25 MG tablet Take by mouth.  [name] aspirin EC 81 MG tablet Take 81 mg by mouth daily. Swallow whole.  [name] atorvastatin (LIPITOR) 20 MG tablet Take 20 mg by mouth daily.   [name] AUVI-Q 0.3 MG/0.3ML SOAJ injection Use as directed for life-threatening allergic reaction. (Patient not taking: Reported on 08/16/2020)  . budesonide (PULMICORT) 0.5 MG/2ML nebulizer solution Take 2 mLs (0.5 mg total) by nebulization as directed. (Patient not taking: Reported on 07/24/2020)  . budesonide-formoterol (SYMBICORT) 160-4.5 MCG/ACT inhaler Inhale 2 puffs into the lungs 2 (two) times daily. (Patient not taking: Reported on 07/24/2020)  . DULoxetine (CYMBALTA) 30 MG capsule Take 1 capsule (30 mg total) by mouth daily. (Patient not taking: Reported on 08/16/2020)  . glucagon 1 MG injection Follow package directions for low blood sugar. (Patient not taking: Reported on 08/16/2020)  . HUMALOG 100 UNIT/ML injection INJECT 300 UNITS UNDER THE SKIN VIA INSULIN PUMP EVERY 48 TO 72 HOURS PER HYPERGLYCEMIA AND DKA PROTOCOLS  . levocetirizine (XYZAL) 5 MG tablet Take 1 tablet (5 mg total) by mouth daily as  needed for allergies.  [name] losartan (COZAAR) 50 MG tablet TAKE 1 TABLET(50 MG) BY MOUTH DAILY  . methocarbamol (ROBAXIN) 500 MG tablet Take 1 tablet (500 mg total) by mouth 2 (two) times daily. (Patient not taking: Reported on 08/16/2020)  . Mometasone Furoate (ASMANEX HFA) 200 MCG/ACT AERO For asthma flare, begin Asmanex 200-2 puffs twice a day for 1-2 weeks or until cough and wheeze free (Patient not taking: Reported on 08/16/2020)  . montelukast (SINGULAIR) 10 MG tablet TAKE 1 TABLET(10 MG) BY MOUTH AT BEDTIME  . mupirocin ointment (BACTROBAN) 2 % mupirocin 2 % topical ointment  APP EXT IEN BID  (Patient not taking: Reported on 08/16/2020)  . NUCALA 100 MG/ML SOAJ INJECT 100MG  SUBCUTANEOUSLY EVERY 30 DAYS (GIVEN AT MD  OFFICE)  . nystatin (MYCOSTATIN) 100000 UNIT/ML suspension Take by mouth. (Patient not taking: Reported on 08/16/2020)  . omeprazole (PRILOSEC) 20 MG capsule Take 20 mg by mouth daily as needed (for reflux).  (Patient not taking: Reported on 08/16/2020)  . ONETOUCH ULTRA test strip USE TO CHECK BLOOD SUGAR 6 TIMES DAILY AS DIRECTED  . topiramate (TOPAMAX) 50 MG tablet TAKE 1 TABLET(50 MG) BY MOUTH TWICE DAILY   Current Facility-Administered Medications (Other)  Medication Route  . Mepolizumab SOLR 100 mg Subcutaneous   Facility-Administered Medications Ordered in Other Visits (Other)  Medication Route  . gadopentetate dimeglumine (MAGNEVIST) injection 20 mL Intravenous      REVIEW OF SYSTEMS:    ALLERGIES Allergies  Allergen Reactions  . Flonase [Fluticasone] Shortness Of Breath  . Molds & Smuts Other (See Comments)    Aggravate asthma  . Sulfites Itching and Swelling  . Tetracyclines & Related Other (See Comments)    increased intracranial pressure  . [place] Cough    PAST MEDICAL HISTORY Past Medical History:  Diagnosis Date  . ADHD (attention deficit hyperactivity disorder)   . Asthma   . Autonomic neuropathy due to diabetes (HCC)   . Diabetes (HCC)   . Fatigue   . Goiter   . High blood pressure   . High cholesterol   . Hypercholesterolemia   . Hypertension   . Hypoglycemia associated with diabetes (HCC)   . Obesity   . Tachycardia   . Type 1 diabetes mellitus not at goal [facility])   . Uncontrolled DM with microalbuminuria or microproteinuria    Past Surgical History:  Procedure Laterality Date  . EYE SURGERY Right 05/30/2020   Vitrectomy, Dr. 07/30/2020  . EYE SURGERY  04/2020  . IR 3D INDEPENDENT WKST  07/16/2020  . IR ANGIO INTRA EXTRACRAN SEL COM CAROTID INNOMINATE BILAT MOD SED  07/16/2020  . IR ANGIO VERTEBRAL SEL SUBCLAVIAN  INNOMINATE UNI L MOD SED  07/16/2020  . IR ANGIO VERTEBRAL SEL VERTEBRAL UNI R MOD SED  07/16/2020  . IR 07/18/2020 GUIDE VASC ACCESS RIGHT  07/16/2020  . REFRACTIVE SURGERY     x9  . WISDOM TOOTH EXTRACTION      FAMILY HISTORY Family History  Problem Relation Age of Onset  . Dementia Paternal Aunt   . Schizophrenia Paternal Aunt   . Cancer Maternal Grandmother   . Diabetes Maternal Grandmother        T2 DM  . Cancer Maternal Grandfather   . Heart disease Father   . Thyroid disease Neg Hx     SOCIAL HISTORY Social History   Tobacco Use  . Smoking status: Never Smoker  . Smokeless tobacco: Never Used  Vaping Use  . Vaping Use: Never  used  Substance Use Topics  . Alcohol use: No  . Drug use: No         OPHTHALMIC EXAM:  Base Eye Exam    Visual Acuity (ETDRS)      Right Left   Dist cc 20/20 20/20 -1   Correction: Glasses       Tonometry (Tonopen, 9:09 AM)      Right Left   Pressure 13 17       Pupils      Pupils Dark Light Shape React APD   Right PERRL 5 4 Round Brisk None   Left PERRL 5 4 Round Brisk None       Visual Fields (Counting fingers)      Left Right    Full Full       Extraocular Movement      Right Left    Full Full       Neuro/Psych    Oriented x3: Yes   Mood/Affect: Normal       Dilation    Both eyes: 2.5% Phenylephrine, 1.0% Mydriacyl @ 9:13 AM        Slit Lamp and Fundus Exam    External Exam      Right Left   External Normal Normal       Slit Lamp Exam      Right Left   Lids/Lashes Normal Normal   Conjunctiva/Sclera White and quiet White and quiet   Cornea Clear Clear   Anterior Chamber Deep and quiet Deep and quiet   Iris Round and reactive Round and reactive   Lens Trace Nuclear sclerosis Trace Nuclear sclerosis   Anterior Vitreous Normal Normal       Fundus Exam      Right Left   Posterior Vitreous Normal Normal   Disc Normal Normal, No edema, no heme   C/D Ratio 0.0 0.0   Macula Microaneurysms, no clinically  significant macular edema Microaneurysms, no clinically significant macular edema   Vessels PDR-inactive , good laser photocoagulation and diathermized sites of prior neovascularization superior to the nerve PDR-inactive , good laser photocoagulation and diathermized sites of prior neovascularization superior to the nerve   Periphery PRP, no retinal holes or tears, attached PRP, no retinal holes or tears, attached          IMAGING AND PROCEDURES  Imaging and Procedures for 09/12/20  OCT, Retina - OU - Both Eyes       Right Eye Quality was good. Scan locations included subfoveal. Central Foveal Thickness: 340. Progression has been stable.   Left Eye Quality was good. Scan locations included subfoveal. Central Foveal Thickness: 326. Progression has been stable. Findings include vitreomacular adhesion , normal foveal contour.   Notes Near normal foveal contour OU                ASSESSMENT/PLAN:  Controlled type 1 diabetes mellitus with stable proliferative retinopathy of both eyes (HCC) Status post PRP OU, in the past patient had diabetic papillopathy at the in the right eye, which improved somewhat, but final resolution did not occur until patient's undiagnosed pseudotumor was fundamentally treated and controlled, at my insistence of work-up.  OS, some room for PRP anteriorly, yet no active neovascularization detected,  Nuclear sclerotic cataract of both eyes Minor OU not visually significant  Optic neuritis, posterior This may have been present in the past right eye, as a consequence of prior diabetic papillotomy with the, in the midst of his treatment for active PDR while  having undiagnosed pseudotumor with secondary mild optic nerve edema      ICD-10-CM   1. Vitreomacular adhesion of left eye  H43.822 OCT, Retina - OU - Both Eyes  2. Controlled type 1 diabetes mellitus with stable proliferative retinopathy of both eyes (HCC)  E10.3553   3. Vitreous hemorrhage of  right eye (HCC)  H43.11 OCT, Retina - OU - Both Eyes  4. 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 Nuclear sclerotic cataract of both eyes  H25.13   6. Optic neuritis, posterior  H46.9     1.  Patient continues to report unusual paracentral visual shapes and symptoms of crosshatching and/or color squares or other shapes in one of the other eye which tend to be evanescent and spontaneously resolved at particular times of the day.  2.  I encouraged the patient to remember that he has been under the care of Dr. Theone Murdochim Martin, expert in neurological visual symptomatology, neuro-ophthalmologist [facility], in situations such as this but these types of form shapes do not come from the retina but in fact are CNS in origin  3.  Patient reports [facility] in the near future.  4.  Ophthalmic Meds Ordered this visit:  No orders of the defined types were placed in this encounter.      Return in about 9 months (around 06/12/2021) for DILATE OU, COLOR FP.  There are no Patient Instructions on file for this visit.   Explained the diagnoses, plan, and follow up with the patient and they expressed understanding.  Patient expressed understanding of the importance of proper follow up care.   Alford HighlandGary A. Kathan Kirker M.D. Diseases & Surgery of the Retina and Vitreous [facility] 09/12/20     Abbreviations: M myopia (nearsighted); A astigmatism; H hyperopia (farsighted); P presbyopia; Mrx spectacle prescription;  CTL contact lenses; OD right eye; OS left eye; OU both eyes  XT exotropia; ET esotropia; PEK punctate epithelial keratitis; PEE punctate epithelial erosions; DES dry eye syndrome; MGD meibomian gland dysfunction; ATs artificial tears; PFAT's preservative free artificial tears; NSC nuclear sclerotic  cataract; PSC posterior subcapsular cataract; ERM epi-retinal membrane; PVD posterior vitreous detachment; RD retinal detachment; DM diabetes mellitus; DR diabetic retinopathy; NPDR non-proliferative diabetic retinopathy; PDR proliferative diabetic retinopathy; CSME clinically significant macular edema; DME diabetic macular edema; dbh dot blot hemorrhages; CWS cotton wool spot; POAG primary open angle glaucoma; C/D cup-to-disc ratio; HVF humphrey visual field; GVF goldmann visual field; OCT optical coherence tomography; IOP intraocular pressure; BRVO Branch retinal vein occlusion; CRVO central retinal vein occlusion; CRAO central retinal artery occlusion; BRAO branch retinal artery occlusion; RT retinal tear; SB scleral buckle; PPV pars plana vitrectomy; VH Vitreous hemorrhage; PRP panretinal laser photocoagulation; IVK intravitreal kenalog; VMT vitreomacular traction; MH Macular hole;  NVD neovascularization of the disc; NVE neovascularization elsewhere; AREDS age related eye disease study; ARMD age related macular degeneration; POAG primary open angle glaucoma; EBMD epithelial/anterior basement membrane dystrophy; ACIOL anterior chamber intraocular lens; IOL intraocular lens; PCIOL posterior chamber intraocular lens; Phaco/IOL phacoemulsification with intraocular lens placement; PRK photorefractive keratectomy; LASIK laser assisted in situ keratomileusis; HTN hypertension; DM diabetes mellitus; COPD chronic obstructive pulmonary disease

## 2020-09-12 NOTE — Assessment & Plan Note (Signed)
This may have been present in the past right eye, as a consequence of prior diabetic papillotomy with the, in the midst of his treatment for active PDR while having undiagnosed pseudotumor with secondary mild optic nerve edema

## 2020-09-12 NOTE — Assessment & Plan Note (Signed)
Minor OU not visually significant ?

## 2020-09-18 ENCOUNTER — Ambulatory Visit: Payer: 59 | Admitting: Vascular Surgery

## 2020-09-18 ENCOUNTER — Encounter: Payer: Self-pay | Admitting: Vascular Surgery

## 2020-09-18 ENCOUNTER — Other Ambulatory Visit: Payer: Self-pay

## 2020-09-18 VITALS — BP 114/78 | HR 90 | Temp 97.9°F | Resp 20 | Ht 72.0 in | Wt 227.0 lb

## 2020-09-18 DIAGNOSIS — I6522 Occlusion and stenosis of left carotid artery: Secondary | ICD-10-CM

## 2020-09-18 NOTE — Progress Notes (Signed)
ASSESSMENT & PLAN:  39 y.o. male with left asymptomatic intracranial carotid artery stenosis; pseudotumor cerebri with continued symptoms despite medical therapy. I explained to the patient that I do not treat intracranial lesions and reviewed some of the science behind intracranial carotid artery stenosis. I explained the risk of stroke and the need for best medical therapy which he is already on (ASA / Statin). I do not think his symptoms can be attributed to intracranial carotid artery stenosis.  He feels his symptoms are not improving despite medical therapy and would like to see someone to discuss this further. Refer to neurosurgery for further discussion of intracranial stenosis and pseudotumor cerebri.   CHIEF COMPLAINT:   Pulsatile tinnitus, headache, intracranial stenosis found on recent cerebral angiogram  HISTORY:  HISTORY OF PRESENT ILLNESS: [name] is a 39 y.o. male who [facility]ent[facility] to clinic for evaluation of intracranial carotid artery stenosis demonstrated on cerebral angiogram of 07/16/2020.  He has been under the care of Molli KnockMichael Brennan, MD for type 1 diabetes mellitus who referred him to me today.  He has had bilateral diabetic retinopathy, followed by Dr. Luciana Axeankin, autonomic neuropathy, hypercholesterolemia, pseudotumor cerebri followed by Dr Zipporah Plantsoheimer.  He has multiple complaints today.  He reports pulsatile tinnitus.  He reports visual disturbances, including "pulsating blood vessels" in his visual field.  He denies symptoms typical for left hemispheric stroke.  He does not have typical left sided weakness, dysarthria, facial droop.  Does endorse some left shoulder weakness states this is been present for some time.  He has been taking aspirin and statin therapy.  He has difficult to control diabetes.  Past Medical History:  Diagnosis Date  . ADHD (attention deficit hyperactivity disorder)   . Asthma   . Autonomic neuropathy due to diabetes (HCC)   . Diabetes (HCC)     . Fatigue   . Goiter   . High blood pressure   . High cholesterol   . Hypercholesterolemia   . Hypertension   . Hypoglycemia associated with diabetes (HCC)   . Obesity   . Tachycardia   . Type 1 diabetes mellitus not at goal [facility](HCC)   . Uncontrolled DM with microalbuminuria or microproteinuria     Past Surgical History:  Procedure Laterality Date  . EYE SURGERY Right 05/30/2020   Vitrectomy, Dr. Luciana Axeankin  . EYE SURGERY  04/2020  . IR 3D INDEPENDENT WKST  07/16/2020  . IR ANGIO INTRA EXTRACRAN SEL COM CAROTID INNOMINATE BILAT MOD SED  07/16/2020  . IR ANGIO VERTEBRAL SEL SUBCLAVIAN INNOMINATE UNI L MOD SED  07/16/2020  . IR ANGIO VERTEBRAL SEL VERTEBRAL UNI R MOD SED  07/16/2020  . IR US GUIDE VASC ACCESS RIGHT  07/16/2020  . REFRACTIVE SURGERY     x9  . WISDOM TOOTH EXTRACTION      Family History  Problem Relation Age of Onset  . Dementia Paternal Aunt   . Schizophrenia Paternal Aunt   . Cancer Maternal Grandmother   . Diabetes Maternal Grandmother        T2 DM  . Cancer Maternal Grandfather   . Heart disease Father   . Thyroid disease Neg Hx     Social History   Socioeconomic History  . Marital status: Single    Spouse name: Not on file  . Number of children: Not on file  . Years of education: Not on file  . Highest education level: Not on file  Occupational History  . Not on file  Tobacco  Use  . Smoking status: Never Smoker  . Smokeless tobacco: Never Used  Vaping Use  . Vaping Use: Never used  Substance and Sexual Activity  . Alcohol use: No  . Drug use: No  . Sexual activity: Not on file  Other Topics Concern  . Not on file  Social History Narrative   ** Merged History Encounter **       Social Determinants of Health   Financial Resource Strain:   . Difficulty of Paying Living Expenses: Not on file  Food Insecurity:   . Worried About Programme researcher, broadcasting/film/video in the Last Year: Not on file  . Ran Out of Food in the Last Year: Not on file  Transportation  Needs:   . Lack of Transportation (Medical): Not on file  . Lack of Transportation (Non-Medical): Not on file  Physical Activity:   . Days of Exercise per Week: Not on file  . Minutes of Exercise per Session: Not on file  Stress:   . Feeling of Stress : Not on file  Social Connections:   . Frequency of Communication with Friends and Family: Not on file  . Frequency of Social Gatherings with Friends and Family: Not on file  . Attends Religious Services: Not on file  . Active Member of Clubs or Organizations: Not on file  . Attends Banker Meetings: Not on file  . Marital Status: Not on file  Intimate Partner Violence:   . Fear of Current or Ex-Partner: Not on file  . Emotionally Abused: Not on file  . Physically Abused: Not on file  . Sexually Abused: Not on file    Allergies  Allergen Reactions  . Flonase [Fluticasone] Shortness Of Breath  . Molds & Smuts Other (See Comments)    Aggravate asthma  . Sulfites Itching and Swelling  . Tetracyclines & Related Other (See Comments)    increased intracranial pressure  . [place] Cough    Current Outpatient Medications  Medication Sig Dispense Refill  . acetaminophen (TYLENOL) 500 MG tablet Take 1,000 mg by mouth 2 (two) times daily as needed.  (Patient not taking: Reported on 08/16/2020)    . acetaZOLAMIDE (DIAMOX) 500 MG capsule TAKE 2 CAPSULES BY MOUTH EVERY MORNING AND ONE IN THE EVENING 90 capsule 1  . albuterol (PROAIR HFA) 108 (90 Base) MCG/ACT inhaler Inhale 2 puffs into the lungs every 6 (six) hours as needed for wheezing or shortness of breath. (Patient not taking: Reported on 08/16/2020) 1 Inhaler 2  . albuterol (PROAIR HFA) 108 (90 Base) MCG/ACT inhaler Inhale 2 puffs into the lungs every 4 (four) hours as needed for wheezing or shortness of breath. (Patient not taking: Reported on 07/24/2020) 18 g 1  . albuterol (PROVENTIL) (2.5 MG/3ML) 0.083% nebulizer solution Take 3 mLs (2.5 mg total) by nebulization every  4 (four) hours as needed for wheezing or shortness of breath. (Patient not taking: Reported on 07/24/2020) 75 mL 1  . ALPRAZolam (XANAX) 0.25 MG tablet Take by mouth.    [name] aspirin EC 81 MG tablet Take 81 mg by mouth daily. Swallow whole.    [name] atorvastatin (LIPITOR) 20 MG tablet Take 20 mg by mouth daily.   0  . AUVI-Q 0.3 MG/0.3ML SOAJ injection Use as directed for life-threatening allergic reaction. (Patient not taking: Reported on 08/16/2020) 2 each 2  . budesonide (PULMICORT) 0.5 MG/2ML nebulizer solution Take 2 mLs (0.5 mg total) by nebulization as directed. (Patient not taking: Reported on 07/24/2020) 2  mL 3  . budesonide-formoterol (SYMBICORT) 160-4.5 MCG/ACT inhaler Inhale 2 puffs into the lungs 2 (two) times daily. (Patient not taking: Reported on 07/24/2020) 1 Inhaler 5  . DULoxetine (CYMBALTA) 30 MG capsule Take 1 capsule (30 mg total) by mouth daily. (Patient not taking: Reported on 08/16/2020) 30 capsule 1  . glucagon 1 MG injection Follow package directions for low blood sugar. (Patient not taking: Reported on 08/16/2020) 1 each 3  . HUMALOG 100 UNIT/ML injection INJECT 300 UNITS UNDER THE SKIN VIA INSULIN PUMP EVERY 48 TO 72 HOURS PER HYPERGLYCEMIA AND DKA PROTOCOLS 120 mL 5  . levocetirizine (XYZAL) 5 MG tablet Take 1 tablet (5 mg total) by mouth daily as needed for allergies. 30 tablet 5  . losartan (COZAAR) 50 MG tablet TAKE 1 TABLET(50 MG) BY MOUTH DAILY 90 tablet 1  . methocarbamol (ROBAXIN) 500 MG tablet Take 1 tablet (500 mg total) by mouth 2 (two) times daily. (Patient not taking: Reported on 08/16/2020) 60 tablet 1  . Mometasone Furoate (ASMANEX HFA) 200 MCG/ACT AERO For asthma flare, begin Asmanex 200-2 puffs twice a day for 1-2 weeks or until cough and wheeze free (Patient not taking: Reported on 08/16/2020) 13 g 5  . montelukast (SINGULAIR) 10 MG tablet TAKE 1 TABLET(10 MG) BY MOUTH AT BEDTIME 30 tablet 5  . mupirocin ointment (BACTROBAN) 2 % mupirocin 2 % topical ointment  APP  EXT IEN BID (Patient not taking: Reported on 08/16/2020)    . NUCALA 100 MG/ML SOAJ INJECT 100MG  SUBCUTANEOUSLY EVERY 30 DAYS (GIVEN AT MD  OFFICE) 1 mL 11  . nystatin (MYCOSTATIN) 100000 UNIT/ML suspension Take by mouth. (Patient not taking: Reported on 08/16/2020)    . omeprazole (PRILOSEC) 20 MG capsule Take 20 mg by mouth daily as needed (for reflux).  (Patient not taking: Reported on 08/16/2020)    . ONETOUCH ULTRA test strip USE TO CHECK BLOOD SUGAR 6 TIMES DAILY AS DIRECTED 200 strip 5  . topiramate (TOPAMAX) 50 MG tablet TAKE 1 TABLET(50 MG) BY MOUTH TWICE DAILY 60 tablet 5   Current Facility-Administered Medications  Medication Dose Route Frequency Provider Last Rate Last Admin  . Mepolizumab SOLR 100 mg  100 mg Subcutaneous Q28 days 08/18/2020, MD   100 mg at 02/16/19 1018   Facility-Administered Medications Ordered in Other Visits  Medication Dose Route Frequency Provider Last Rate Last Admin  . gadopentetate dimeglumine (MAGNEVIST) injection 20 mL  20 mL Intravenous Once PRN Dohmeier, 02/18/19, MD        REVIEW OF SYSTEMS:  [X]  denotes positive finding, [ ]  denotes negative finding Cardiac  Comments:  Chest pain or chest pressure:    Shortness of breath upon exertion:    Short of breath when lying flat:    Irregular heart rhythm:        Vascular    Pain in calf, thigh, or hip brought on by ambulation:    Pain in feet at night that wakes you up from your sleep:     Blood clot in your veins:    Leg swelling:         Pulmonary    Oxygen at home:    Productive cough:     Wheezing:         Neurologic    Sudden weakness in arms or legs:     Sudden numbness in arms or legs:     Sudden onset of difficulty speaking or slurred speech:    Temporary loss of vision  in one eye:     Problems with dizziness:         Gastrointestinal    Blood in stool:     Vomited blood:         Genitourinary    Burning when urinating:     Blood in urine:        Psychiatric      Major depression:         Hematologic    Bleeding problems:    Problems with blood clotting too easily:        Skin    Rashes or ulcers:        Constitutional    Fever or chills:     PHYSICAL EXAM:   Vitals:   09/18/20 1016 09/18/20 1018  BP: 113/73 114/78  Pulse: 90   Resp: 20   Temp: 97.9 F (36.6 C)   SpO2: 99%   Weight: 227 lb (103 kg)   Height: 6' (1.829 m)     Constitutional: Well appearing in no distress. Appears well nourished.  Neurologic: Normal gait and station. CN intact. No weakness. No sensory loss. Psychiatric: Mood and affect symmetric and appropriate. Eyes: No icterus. No conjunctival pallor. Ears, nose, throat: mucous membranes moist. Midline trachea. No carotid bruit. Cardiac: regular rate and rhythm.  Respiratory: unlabored. Abdominal: soft, non-tender, non-distended. No palpable pulsatile abdominal mass. Extremity: No edema. No cyanosis. No pallor.  Skin: No gangrene. No ulceration.  Lymphatic: No Stemmer's sign. No palpable lymphadenopathy.   DATA REVIEW:    Most recent CBC CBC Latest Ref Rng & Units 07/16/2020 11/29/2018 06/02/2018  WBC 4.0 - 10.5 K/uL 7.1 11.6(H) 7.6  Hemoglobin 13.0 - 17.0 g/dL 24.4 01.0 27.2  Hematocrit 39 - 52 % 47.9 50.9 49.7  Platelets 150 - 400 K/uL 248 330 261     Most recent CMP CMP Latest Ref Rng & Units 07/16/2020 12/30/2018 11/29/2018  Glucose 70 - 99 mg/dL 536(U) 440(H) 474(Q)  BUN 6 - 20 mg/dL 10 11 14   Creatinine 0.61 - 1.24 mg/dL 5.95 6.38  Sodium 135 - 145 mmol/L 141 138 142  Potassium 3.5 - 5.1 mmol/L 3.3(L) 3.8 4.2  Chloride 98 - 111 mmol/L 112(H) 108 109(H)  CO2 22 - 32 mmol/L 21(L) 21 17(L)  Calcium 8.9 - 10.3 mg/dL 9.2 9.1 9.7  Total Protein 6.1 - 8.1 g/dL - 6.6 7.2  Total Bilirubin 0.2 - 1.2 mg/dL - 0.6 0.4  Alkaline Phos 39 - 117 IU/L - - 107  AST 10 - 40 U/L - 18 15  ALT 9 - 46 U/L - 26 24    Renal function CrCl cannot be calculated (Patient's most recent lab result is older than the  maximum 21 days allowed.).  Hemoglobin A1C (%)  Date Value  08/16/2020 6.2 (A)    LDL Cholesterol (Calc)  Date Value Ref Range Status  12/30/2018 106 (H) mg/dL (calc) Final    Comment:    Reference range: <100 . Desirable range <100 mg/dL for primary prevention;   <70 mg/dL for patients with CHD or diabetic patients  with > or = 2 CHD risk factors. 03/01/2019 LDL-C is now calculated using the Martin-Hopkins  calculation, which is a validated novel method providing  better accuracy than the Friedewald equation in the  estimation of LDL-C.  [name] et al. Horald Pollen. Lenox Ahr): 2061-2068  (http://education.[facility].com/faq/FAQ164)     Cerebral angiogram 07/16/20   07/18/20. Rande Brunt, MD [facility]  Number: [phone] 09/18/2020 10:17 AM

## 2020-09-25 ENCOUNTER — Encounter (INDEPENDENT_AMBULATORY_CARE_PROVIDER_SITE_OTHER): Payer: 59 | Admitting: Ophthalmology

## 2020-10-02 ENCOUNTER — Encounter (INDEPENDENT_AMBULATORY_CARE_PROVIDER_SITE_OTHER): Payer: 59 | Admitting: Ophthalmology

## 2020-10-15 ENCOUNTER — Other Ambulatory Visit (INDEPENDENT_AMBULATORY_CARE_PROVIDER_SITE_OTHER): Payer: Self-pay

## 2020-10-16 ENCOUNTER — Ambulatory Visit (INDEPENDENT_AMBULATORY_CARE_PROVIDER_SITE_OTHER): Payer: 59 | Admitting: "Endocrinology

## 2020-10-18 ENCOUNTER — Other Ambulatory Visit (INDEPENDENT_AMBULATORY_CARE_PROVIDER_SITE_OTHER): Payer: Self-pay | Admitting: "Endocrinology

## 2020-11-09 ENCOUNTER — Other Ambulatory Visit: Payer: Self-pay | Admitting: Neurology

## 2020-11-13 ENCOUNTER — Telehealth (INDEPENDENT_AMBULATORY_CARE_PROVIDER_SITE_OTHER): Payer: Self-pay | Admitting: "Endocrinology

## 2020-11-13 NOTE — Telephone Encounter (Signed)
°  Who's calling (name and relationship to patient) : Vicente Serene ( self)  Best contact [phone]  Provider they see: Dr. Fransico Michael  Reason for call: Patients transmitter has failed and [employer] needs a new prescription sent over to be able to send the patient  New one. His is almost non- functional. He said please call with any questions     PRESCRIPTION REFILL ONLY  Name of prescription:  Pharmacy:

## 2020-11-14 NOTE — Telephone Encounter (Signed)
Checked providers box, there was a form from [employer] to complete regarding the Oncologist.  Form completed, signed by on call provider and faxed to [employer].   Called patient to update

## 2020-11-19 ENCOUNTER — Encounter (INDEPENDENT_AMBULATORY_CARE_PROVIDER_SITE_OTHER): Payer: Self-pay

## 2020-11-20 NOTE — Telephone Encounter (Signed)
  Who's calling (name and relationship to patient) : [name] ( self)  Best contact number: [phone]  Provider they see: Dr. Fransico Michael   Reason for call: Patient is having a lot of bleeding and briusing and currently needs sensors he was just following up on his message from yesterday Please advise if I need to call and schedule an appt     PRESCRIPTION REFILL ONLY  Name of prescription:  Pharmacy:

## 2020-11-20 NOTE — Telephone Encounter (Signed)
[name] called back just checking to see if a provider was going to be able to call him back today

## 2020-11-22 ENCOUNTER — Encounter (INDEPENDENT_AMBULATORY_CARE_PROVIDER_SITE_OTHER): Payer: Self-pay

## 2020-11-22 ENCOUNTER — Telehealth (INDEPENDENT_AMBULATORY_CARE_PROVIDER_SITE_OTHER): Payer: Self-pay | Admitting: "Endocrinology

## 2020-11-22 DIAGNOSIS — E1065 Type 1 diabetes mellitus with hyperglycemia: Secondary | ICD-10-CM

## 2020-11-22 MED ORDER — INSULIN LISPRO (1 UNIT DIAL) 100 UNIT/ML (KWIKPEN): PEN_INJECTOR | SUBCUTANEOUS | 5 refills | Status: DC

## 2020-11-22 MED ORDER — PEN NEEDLES 32G X 4 MM MISC: 5 refills | Status: AC

## 2020-11-22 NOTE — Telephone Encounter (Signed)
  Who's calling (name and relationship to patient) : [name] (self)  Best contact number: [phone]  Provider they see: Dr. Fransico Michael  Reason for call: Patient states that he needs pen needles sent to pharmacy.    PRESCRIPTION REFILL ONLY  Name of prescription: Pen needles  Pharmacy: [id number] - [place], [place] - [address]

## 2020-11-22 NOTE — Telephone Encounter (Signed)
Pen needles script sent to pharmacy

## 2020-11-23 ENCOUNTER — Telehealth (INDEPENDENT_AMBULATORY_CARE_PROVIDER_SITE_OTHER): Payer: Self-pay | Admitting: "Endocrinology

## 2020-11-23 NOTE — Telephone Encounter (Signed)
Who's calling (name and relationship to patient) : Daquawn Seelman  Best contact number: [phone]  Provider they see: Dr. Fransico Michael  Reason for call: Patient is having issues with pump. Patient got in touch with [employer] and they have approved new supplies to be sent to patient. [employer] faxed [place] a prescription that needs to be signed off. That is all that is needed for the process to continue.   Call ID:      PRESCRIPTION REFILL ONLY  Name of prescription:  Pharmacy:

## 2020-11-23 NOTE — Telephone Encounter (Signed)
Contacted [name] and let him know we have not yet received the fax from [employer]. Let Gab know if there is a way we can escribe it then we can do that for it as it is something we send out, now waiting for them to send to [place]. [name] will reach out to [employer] and let them know we haven't received it.

## 2020-11-26 ENCOUNTER — Telehealth (INDEPENDENT_AMBULATORY_CARE_PROVIDER_SITE_OTHER): Payer: Self-pay | Admitting: "Endocrinology

## 2020-11-26 NOTE — Telephone Encounter (Signed)
Patient called and states that [employer] is going to be sending him the equipment. He is asking if he can come in once he receives it for someone to show him how to use it. Call back number is [phone].

## 2020-11-26 NOTE — Telephone Encounter (Signed)
I received the paperwork. I will fax it when Dr Fransico Michael signs it.

## 2020-11-26 NOTE — Telephone Encounter (Signed)
[name] called back and states that he just got off the phone with [employer] and they are re-faxing the form for Dr. Fransico Michael to sign. He requests that if we do not receive that form today that we give him a call and let him know and he will contact them again.

## 2020-11-26 NOTE — Telephone Encounter (Signed)
Who's calling (name and relationship to patient) : Apolonio Schneiders  Best contact number: [phone]  Provider they see: Dr. Fransico Michael  Reason for call: Patient wants medical device to be overnighted CMN is needed first.   Call ID:      PRESCRIPTION REFILL ONLY  Name of prescription:  Pharmacy:

## 2020-11-26 NOTE — Telephone Encounter (Signed)
faxed

## 2020-11-26 NOTE — Telephone Encounter (Signed)
[employer] is calling again because there is a deadline for overnighting supplies.

## 2020-11-26 NOTE — Telephone Encounter (Signed)
Can you schedule him an appointment with Zachery Conch?

## 2020-12-04 NOTE — Progress Notes (Signed)
Subjective:  Patient Name: [name] Date of Birth: Aug 28, 1981  MRN: [id number]  [name]  presents at his clinic visit today for follow-up of his type 1 diabetes mellitus, goiter, obesity, combined hyperlipidemia, adult ADHD, fatigue, autonomic neuropathy, tachycardia, hypertension, microalbuminuria, hypoglycemia, sinusitis, bronchitis, allergies, proliferative retinopathy, and pseudotumor cerebri/increased intracranial hypertension.   HISTORY OF PRESENT ILLNESS:   [name] is a 40 y.o. Caucasian gentleman. [name] was unaccompanied.  1. The patient was first referred to me on 01/08/2006 by his family physician, Dr. Andreas Blower, for evaluation and management of type 1 diabetes mellitus and related problems. The patient was 40 years old.  A. [name] had been diagnosed with type 1 diabetes somewhere in 2001-2002, at the age of 20-19. He was initially diagnosed with type 2 diabetes mellitus, but was later re-classified as type 1 diabetes mellitus. He had been followed by Dr. Romilda Garret, staff endocrinologist at [facility]. [name] was started on an insulin pump approximately 18 months prior to first seeing me. His pump was a Medtronic Paradigm 712. His blood glucose control was fair-to-poor. He was frequently not checking blood sugars or taking insulin boluses as often as he needed to. He frequently noted fast heart rate. The patient's past medical history was positive for hypertension, for which he was taking lisinopril, and for what his mother called "extreme ADHD". He had previously been taking ADHD medicines but had discontinued them due to adverse effects. He had prior ankle injuries and knee injuries, to include tears of the lateral menisci bilaterally. He had not had any surgeries. 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 As a result, the patient was on the road a lot and spent much of his time outdoors at  field sites. He did not use tobacco or drugs, but did drink alcohol occasionally.  B. On physical examination, his weight was 234 pounds, his height was 70 inches, his BMI was 33.2. Blood pressure was 128/70. Hemoglobin A1c was 9.5%. Heart rate was 96. He had normal affect and fair insight. He had a 25-30 gram goiter. He had normal 1+ DP pulses in his feet. He had normal sensation in his feet to touch, vibration, and monofilament. His CMP was normal except for glucose of 251. His cholesterol was 257, triglycerides 143, HDL 49, and LDL 179. TSH was 1.017, free T4 was 1.06, and free T3 was 3.3. His urinary microalbumin: creatinine ratio was 20.1 (normal less than 30).   C. The patient clearly needed better blood glucose control, which would only occur if he had better adherence to his plan. He appeared to have autonomic neuropathy, manifested by inappropriate sinus tachycardia.  His thyroid goiter suggested that he might have evolving Hashimoto's disease. He stated that he had lost some weight recently. I encouraged him to check his blood sugars more frequently and to take both correction boluses and food boluses.  2. During the past fifteen years, [name] has had to deal with many different problems:  A. T1DM/hypoglycemia:    1). 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 His hemoglobin A1c values had varied from 8.3-11.2%.   2). After starting him on a Medtronic 670G insulin pump and Guardian 3 sensor on 01/12/17, his BG control has improved significantly, despite the illnesses described below.  B. DM complications: .   1). He has bilateral diabetic retinopathy and is followed by his retinologist, Dr. Deloria Lair, MD.     A). He saw Dr. Zadie [name] in October 2020 and again in February 2021. There were no new signs of diabetic eye disease.     2). At [facility]  visit with me in July 2021 he asked me to arrange a second opinion retinal consultation for him. I arranged for him to see Dr. Dennard [name] in the [place].    2). He has autonomic neuropathy manifested by inappropriate sinus tachycardia and gastroparesis causing postprandial bloating and GERD. These problems have varied inversely with his degree of BG control.   C. Combined hyperlipidemia:     1). On 12/03/10 his total cholesterol was 270, triglycerides 94, HDL 50, and LDL 201. I started him on Crestor, 10 mg per day. He has since begun treatment with atorvastatin and his cholesterol values had improved.    2). On 12/29/16 his lipids had improved, but his cholesterol values were still elevated. After developing the illnesses noted below, his atorvastatin was discontinued due to concerns that he might be having adverse reactions to the atorvastatin.    D. Allergies, sinusitis, bronchitis, headaches, muscle pains/spasms:   1). Unfortunately, beginning in about June 2018, [name] began to develop recurrent episodes of allergic rhinitis, head fullness, sinusitis, and bronchitis. He also developed headaches, muscle pains and spasms, lethargy, fatigue, and weakness that were presumably due in large part to severe allergies. His atorvastatin and several other medications were stopped at that time. Despite receiving excellent care from his allergist, to include allergy injections and biologic injections, [name]'s allergic issues persisted.    2). He saw Dr. Carmelina Peal on 03/15/19 and again in February 2021. [name]'s allergies and asthma have been okay since has been wearing his covid mask. He has not needed steroids recently. His second sleep study was not bad enough to start on C-pap. In the past 8-10 months these illnesses/problems have largely resolved, in part due to taking a monthly biologic allergy injection.   E. Increased intracranial pressure:    1). [name] was seen in consultation by Dr. Larey Seat, neurology, on  05/28/17 for headaches, tremors of his right hand and foot, shaky vision, and symptoms c/w OSA. His initial sleep study did not show any significant amount of OSA.    2). On 03/08/18, Dr.Dohmeier was notified that [place] eye doctor had seen signs of fluid build up in his right eye. An LP was performed in radiology on 03/09/18. Opening pressure was 28 cm of water, c/w increased intracranial pressure and pseudotumor cerebri. This problem was initially though to be due to tetracycline usage, so TCN was discontinued and Diamox begun. The Diamox doses have been changed over time.    3). On 01/03/19 he had an LP that showed elevated pressure. His diamox dose was increased somewhat. Dr. Brett Fairy also started him on Topamax. He had a follow up visit with Dr. Brett Fairy on 02/23/20. She continued him on low doses of both medications. He still has some adverse effects of the Diamox, but not as bad. He still has some mild fatigue.  G. Adult ADD/depression: [name] was evaluated by Dr Carlena Hurl, a noted neuropsychiatrist in [place] in 2018. At his last follow up visit in November 2018, Dr. Wylene Simmer decided to wait until [name]'s overall health situation  stabilized before trying any new medications.   H. General physical and emotional health: [name] has been very sick  for much of the past 3 years. He has been unable to work full-time and has become almost confined to his home. He has become increasingly anhedonic over time.   I. Neck pains, decreased cervical range of motion, and shoulder girdle pains:   1). Several months ago he began to develop pains in his left upper chest and shoulder area. He saw Dr. Oneida Alar. The pains then went into his right upper chest and shoulder, then into his shoulder blades. Now he can't bend his neck. [place] of his left shoulder was negative.    2). He missed his follow up appointment with Dr. Oneida Alar due to other conflicting medical appointments. I asked him to re-schedule that appointment.  J. He  had an MRI and an MRA of his brain. The MRI of the brain and brainstem down to C3 was normal. The MRA showed the following:  Moderate stenosis of the M1 segment of the left middle cerebral artery.  Negligible flow through the A1 segment of the left anterior cerebral artery.  This could be due to a hypoplastic/aplastic segment, a normal variant.  This could also be due to severe stenosis.  The left A2 segment derived this flow from the right to through the anterior communicating artery a "moderate stenosis of the M1 segment of the left middle cerebral artery: He then had an angiography procedure to evaluate his bilateral carotid  And innominate arterial circulation. That study showed: Approximately 65-70% stenosis of the left internal carotid artery supraclinoid segment secondary to a mildly irregular intracranial arteriosclerotic plaque. No opacification of the left anterior cerebral A1 segment most likely represents a pathologic intracranial occlusion. 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 Approximately 10 mm x 8.3 mm pyramidal configured high-riding jugular bulb. [name] was told to" take 325 mg of ASA daily as a secondary stroke prevention strategy".  K. Following the angiography procedure he developed bilateral palmar hand numbness. Fortunately, these symptoms are improving   L. He has an asthma consultation with Ms Juliann Pares, [place], on 07/24/20. He had mild restrictive disease, essentially unchanged.  M. He had an evaluation by Dr. Celine Ahr, [facility], on 06/19/20 and a follow up visit on 10.01/21. The cardiologist recommended continuing his  ASA and statin therapy, but did not feel any cardiology follow up was needed     3. [name]'s last [facility] visit was on 08/16/2020. At that visit we continued his insulin pump settings.   A. In the interim he  has not been well. He has cut way back on his carb intake.   B. For the past month he has had bruising and bleeding at his pump sites. He stopped taking ASA about 2 weeks ago, with some improvement. He is having more problems with putting in his insertion sites.   C. About two weeks ago he began having more trouble with his sensors and transmitter. Medtronic is working with him. BGs have been in the 300s-400s. He has been using pens. Medtronic sent him a new pump and sensor 4 day ago. He is still in manual mode.  D. He says that his health is worse. He feels that he is getting weaker. Muscle spasms have been worse. He is losing strength. He is having more pains in his ankles, shins, and wrists. His headaches have not been as bad.   E. He was found  to have vitamin D level of 10. He is now taking 50,00 IU/week.   F. He saw a neurologist at [facility] on 10/15/20. MRA of the brain did not add any additional info. [name] has a follow up visit in March 2022.  G. He continues to see his therapist, Ms. [facility], every week. He is using alprazolam, 0.25 mg at bedtime when needed for anxiety. His PCP will prescribe the medication for him.  He thinks the medication and the therapy are helping him.   H. His nausea only occurs occasionally. He does not usually take omeprazole.   I. He continues to have problems with lactose intolerance. When he avoids lactose, however, he is asymptomatic. Some foods such as shellfish cause his throat to swell up.   J. He uses Humalog aspart insulin in his insulin pump. His BGs have been pretty good. He takes losartan, 50 mg/day. He rarely has any coughing unless his allergies act up. His PCP put him back on atorvastatin several months ago.   H. He saw Dr [name] on 06/27/20.  His eyes looked relatively fine. She gave him an injection in his left eye. He appreciated her consultation, but has difficulty obtaining transportation back and forth to [place], so wants to continue with his  retinologist, Dr. Zadie [name], here in [place].   I. He had an ophthalmology appointment at [facility] 10/23/20. He will see the [facility] neuro-retinal specialist on 12/13/20.    . 4. Pertinent Review of Systems: Constitutional: [name] feels "a little run down, fatigued, and weak". His sleep quality is not good, mostly due to pain and other neurologic symptoms.   Eyes: As above. The visual color changes, muscle spasms, and tingling that were attributed to Diamox now bother him only "occasionally" since reducing the Diamox dose. Neck: He still occasionally has intermittent anterior neck swelling and tenderness.  Lungs: Breathing has been "okay", but he still sometimes has SOB with exertion.  Heart: He thinks his heart beats about the same. Gastrointestinal: As above. He does not have much postprandial bloating or reflux. when he takes his omeprazole. He does have some pains in his abdominal muscles at times.  He does not have other complaints of excessive hunger, heartburn, upset stomach, stomach aches or pains, or diarrhea while taking omeprazole. Unfortunately, when he feels better, he stops taking the omeprazole.  Hands: As above Legs: As above. He still has spasms of the larger muscles of his legs, but at other times they feel like they are vibrating.  These sensations may last for up to a few hours. Muscle mass and strength seem normal. There are no other complaints of numbness, tingling, burning, or pain. No edema is noted. Feet: There are no obvious foot problems now. He still has episodic foot pains, but not often. There are no complaints of numbness, or burning.  No edema is noted. Hypoglycemia: He has not had many low BG alerts.  Emotional: "I'm pretty worn out."    5. Pump printout: He changes sites every 2-3 days. Average BG is 213, compared with 162 at his last visit.    6. CGM printout: He has been in auto mode 0%. He wears his sensor 50%. Average SG is 204, compared with 148 at his last visit. Time in  Range is 22%. Time above range is 78%. SGs average about 200 at midnight. SGs decrease to about 160 about noon, increase to about 290 at 3 PM, decreased to about 200 at 8 PM, then increase to  midnight   PAST MEDICAL, FAMILY, AND SOCIAL HISTORY:  Past Medical History:  Diagnosis Date  . ADHD (attention deficit hyperactivity disorder)   . Asthma   . Autonomic neuropathy due to diabetes ([place])   . Diabetes ([place])   . Fatigue   . Goiter   . High blood pressure   . High cholesterol   . Hypercholesterolemia   . Hypertension   . Hypoglycemia associated with diabetes ([place])   . Obesity   . Tachycardia   . Type 1 diabetes mellitus not at goal [facility])   . Uncontrolled DM with microalbuminuria or microproteinuria     Family History  Problem Relation Age of Onset  . Dementia Paternal Aunt   . Schizophrenia Paternal Aunt   . Cancer Maternal Grandmother   . Diabetes Maternal Grandmother        T2 DM  . Cancer Maternal Grandfather   . Heart disease Father   . Thyroid disease Neg Hx      Current Outpatient Medications:  .  montelukast (SINGULAIR) 10 MG tablet, TAKE 1 TABLET(10 MG) BY MOUTH AT BEDTIME, Disp: 30 tablet, Rfl: 5 .  NUCALA 100 MG/ML SOAJ, INJECT $RemoveBefo'100MG'[id number]$  SUBCUTANEOUSLY EVERY 30 DAYS (GIVEN AT MD  OFFICE), Disp: 1 mL, Rfl: 11 .  ONETOUCH ULTRA test strip, USE TO CHECK BLOOD SUGAR SIX TIMES DAILY AS DIRECTED, Disp: 200 strip, Rfl: 5 .  topiramate (TOPAMAX) 50 MG tablet, TAKE 1 TABLET(50 MG) BY MOUTH TWICE DAILY, Disp: 60 tablet, Rfl: 5 .  acetaminophen (TYLENOL) 500 MG tablet, Take 1,000 mg by mouth 2 (two) times daily as needed.  (Patient not taking: Reported on 08/16/2020), Disp: , Rfl:  .  acetaZOLAMIDE (DIAMOX) 500 MG capsule, TAKE 2 CAPSULES BY MOUTH EVERY MORNING AND ONE IN THE EVENING, Disp: 90 capsule, Rfl: 1 .  albuterol (PROAIR HFA) 108 (90 Base) MCG/ACT inhaler, Inhale 2 puffs into the lungs every 6 (six) hours as needed for wheezing or shortness of breath. (Patient not  taking: Reported on 08/16/2020), Disp: 1 Inhaler, Rfl: 2 .  albuterol (PROAIR HFA) 108 (90 Base) MCG/ACT inhaler, Inhale 2 puffs into the lungs every 4 (four) hours as needed for wheezing or shortness of breath. (Patient not taking: Reported on 07/24/2020), Disp: 18 g, Rfl: 1 .  albuterol (PROVENTIL) (2.5 MG/3ML) 0.083% nebulizer solution, Take 3 mLs (2.5 mg total) by nebulization every 4 (four) hours as needed for wheezing or shortness of breath. (Patient not taking: Reported on 07/24/2020), Disp: 75 mL, Rfl: 1 .  ALPRAZolam (XANAX) 0.25 MG tablet, Take by mouth., Disp: , Rfl:  .  aspirin EC 81 MG tablet, Take 81 mg by mouth daily. Swallow whole., Disp: , Rfl:  .  atorvastatin (LIPITOR) 20 MG tablet, Take 20 mg by mouth daily. , Disp: , Rfl: 0 .  AUVI-Q 0.3 MG/0.3ML SOAJ injection, Use as directed for life-threatening allergic reaction. (Patient not taking: Reported on 08/16/2020), Disp: 2 each, Rfl: 2 .  budesonide (PULMICORT) 0.5 MG/2ML nebulizer solution, Take 2 mLs (0.5 mg total) by nebulization as directed. (Patient not taking: Reported on 07/24/2020), Disp: 2 mL, Rfl: 3 .  budesonide-formoterol (SYMBICORT) 160-4.5 MCG/ACT inhaler, Inhale 2 puffs into the lungs 2 (two) times daily. (Patient not taking: Reported on 07/24/2020), Disp: 1 Inhaler, Rfl: 5 .  DULoxetine (CYMBALTA) 30 MG capsule, Take 1 capsule (30 mg total) by mouth daily. (Patient not taking: Reported on 08/16/2020), Disp: 30 capsule, Rfl: 1 .  glucagon 1 MG injection, Follow package directions for  low blood sugar. (Patient not taking: Reported on 08/16/2020), Disp: 1 each, Rfl: 3 .  HUMALOG 100 UNIT/ML injection, INJECT 300 UNITS UNDER THE SKIN VIA INSULIN PUMP EVERY 48 TO 72 HOURS PER HYPERGLYCEMIA AND DKA PROTOCOLS, Disp: 120 mL, Rfl: 5 .  insulin lispro (HUMALOG KWIKPEN) 100 UNIT/ML KwikPen, Inject up to 50 units per day, Disp: 15 mL, Rfl: 5 .  Insulin Pen Needle (PEN NEEDLES) 32G X 4 MM MISC, Use to inject insulin 6x per day, Disp:  200 each, Rfl: 5 .  levocetirizine (XYZAL) 5 MG tablet, Take 1 tablet (5 mg total) by mouth daily as needed for allergies., Disp: 30 tablet, Rfl: 5 .  losartan (COZAAR) 50 MG tablet, TAKE 1 TABLET(50 MG) BY MOUTH DAILY, Disp: 90 tablet, Rfl: 1 .  methocarbamol (ROBAXIN) 500 MG tablet, Take 1 tablet (500 mg total) by mouth 2 (two) times daily. (Patient not taking: Reported on 08/16/2020), Disp: 60 tablet, Rfl: 1 .  Mometasone Furoate [facility] HFA) 200 MCG/ACT AERO, For asthma flare, begin Asmanex 200-2 puffs twice a day for 1-2 weeks or until cough and wheeze free (Patient not taking: Reported on 08/16/2020), Disp: 13 g, Rfl: 5 .  mupirocin ointment (BACTROBAN) 2 %, mupirocin 2 % topical ointment  APP EXT IEN BID (Patient not taking: Reported on 08/16/2020), Disp: , Rfl:  .  nystatin (MYCOSTATIN) 100000 UNIT/ML suspension, Take by mouth. (Patient not taking: Reported on 08/16/2020), Disp: , Rfl:  .  omeprazole (PRILOSEC) 20 MG capsule, Take 20 mg by mouth daily as needed (for reflux).  (Patient not taking: Reported on 08/16/2020), Disp: , Rfl:   Current Facility-Administered Medications:  [name]  Mepolizumab SOLR 100 mg, 100 mg, Subcutaneous, Q28 days, Valentina Shaggy, MD, 100 mg at 02/16/19 1018  Facility-Administered Medications Ordered in Other Visits:  .  gadopentetate dimeglumine (MAGNEVIST) injection 20 mL, 20 mL, Intravenous, Once PRN, Dohmeier, Asencion Partridge, MD  Allergies as of 12/05/2020 - Review Complete 12/05/2020  Allergen Reaction Noted  . Flonase [fluticasone] Shortness Of Breath 06/10/2017  . Molds & smuts Other (See Comments) 03/09/2018  . Sulfites Itching and Swelling 02/04/2018  . Tetracyclines & related Other (See Comments) 03/09/2018  . White birch Cough 04/10/2020    1. Work and Family: He can no longer work part-time at home. 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  2. Activities: He walks every day, but is in pain.  3. Smoking, alcohol, or drugs: He  has not been drinking any alcohol or beer. No tobacco or drugs. 4. Primary Care Provider: His PCP is Ms Mattie Marlin, PA, [facility].  5. Neurology: Dr. Brett Fairy 6. Ophthalmology:  7. Retinology: Dr. Zadie [name] 8. Urology: Dr. Jeffie Pollock at [facility] 9. Sports medicine: Dr. Oneida Alar 10. Therapy: Ms Rea College, RN, MSN  REVIEW OF SYSTEMS: There are no other significant problems involving Boomer's other body systems.   Objective:  Vital Signs:  BP 117/78   Pulse 82   Wt 213 lb 6.4 oz (96.8 kg)   BMI 28.94 kg/m  heart rate 88   Ht Readings from Last 3 Encounters:  09/18/20 6' (1.829 m)  09/11/20 6' (1.829 m)  07/24/20 6' (1.829 m)   Wt Readings from Last 3 Encounters:  12/05/20 213 lb 6.4 oz (96.8 kg)  09/18/20 227 lb (103 kg)  09/11/20 221 lb (100.2 kg)   PHYSICAL EXAM:  Constitutional: The patient appears overweight, tired, and antalgic today. He has lost 10 more  pounds since his last visit. His affect is fairly normal. His insight is good. He engaged fairly well today.  Eyes: There is no arcus or proptosis.  Mouth: The oropharynx appears normal. The tongue appears normal. There is normal oral moisture. There is no obvious gingivitis. Neck: There are no bruits present. The thyroid gland appears normal. The thyroid gland is again mildly enlarged at about 21 grams in size. The consistency of the thyroid gland is normal. There is no thyroid tenderness to palpation. His cervical ROM is poor.  Lungs: The lungs are clear. Air movement is good. Heart: The heart rhythm and rate appear normal. Heart sounds S1 and S2 are normal. I do not appreciate any pathologic heart murmurs. Abdomen: The abdomen is obese. Bowel sounds are normal. The abdomen is soft and non-tender. There is no obviously palpable hepatomegaly, splenomegaly, or other masses.  Arms: Muscle mass appears appropriate for age.  Hands: There is no obvious tremor. Phalangeal and  metacarpophalangeal joints appear normal. Palms are normal. Sensation to touch is somewhat diminished in his proximal palms.  Legs: Muscle mass appears appropriate for age. There is no edema.  Feet: There are no significant deformities. Dorsalis pedis pulses are normal 1+ bilaterally.  Neurologic:  Muscle strength is 4/5 due to antalgia in his UEs, but 5/5 in the LEs. Muscle tone appears normal. Sensation to touch is normal in the legs and feet.   LAB DATA:   Labs 12/05/20: HbA1c 7.5%, CBG 165  Labs 08/16/20: HbA1c 6.2%, CBG 215  Labs 05/14/20: HbA1c 6.5%, CBG 176; TSH 2.44, free T4 1.1, free T3 3.5; urinary microalbumin/creatinine ratio 2 (ref <30)  Labs 04/17/20: ESR 6 (ref 0-15), CK 82 (ref 49-439)  Labs 07/12/19: HbA1c 6.4%, CBG 231  Labs 04/26/19: HbA1c 6.8%, CBG 230  Labs 12/30/18: TSH 2.07, free T4 1.1, free T3 3.2, TPO antibody <1, thyroglobulin antibody <1; CMP normal, except glucose 191; cholesterol 165, triglycerides 138, HDL 33, LDL 106; urinary microalbumin/creatinine ratio 5; vitamin B1 10 (ref 8-30), vitamin B6 3.4 (ref 2.1-21.7), vitamin B12 451 (ref [phone])  Labs 11/29/18: CBG 179  Labs 11/29/18: CMP with glucose 198, chloride 109 (ref 96-106, CO2 17 (ref 20-29); CBC with WBC 11.6 (ref 3.4-10.8), PMNs 8.6 (ref 1.4-7.0), and monocytes 1.0 (ref 0.1-0.9)  Labs 10/28/18: HbA1c 6.9%, CBG 155  Labs 10/06/18: ANA titer negative, ANCA titers negative, rheumatoid factor negative, cyclic citrullin peptide 7 (ref 0-190  Labs 07/27/18: CBG 193  Labs 05/31/18: HbA1c 6.6%, CBG 202  Labs 03/18/18: CBG 178  Labs 01/13/18: HbA1c 6.8%, CBG 194; TSH 0.98, free T4 1.3; free T3 3.6; CMP normal except glucose 191; urinary microalbumin/creatinine ratio 35 (ref <30)  Labs 11/17/17: CBG 170  Labs 10/13/17: HbA1c 6.8%, CBG 191  Labs 08/24/17: CBG 117  Labs 07/20/17: CBG 216  Labs 07/08/17: IgA, IgG, and IgM normal. CBC normal.  Labs 06/16/17: HbA1c 6.5%, CBG 193  Labs 05/15/17: CBG 209; TSH  0.90, free T4 1.2, free T3 3.8  Labs 03/05/17: HbA1c 7.3%, CBG 241  Labs 02/20/17: CBG 180 fasting  Labs 01/29/17: CBG 148  Labs 01/12/17: CBG 116  Labs 12/29/16: HbA1c 9.1%, CBG 223; TSH 0.62, free T4 1.1, free T3 3.5; urine microalbumin/creatinine ratio 22; cholesterol 171, triglycerides 93, HDL 40, LDL 112;  CMP glucose 201  Labs 09/26/16: HbA1c 9.5%  Labs 08/13/16: Celiac panel negative; CMP normal except for glucose 207; CBC normal  Labs 06/13/16: HbA1c 9.8%; LH 3.6, FSH 2.3, testosterone 533 (ref [id number]), free testosterone  59.4 (ref 47-244)  Labs 02/11/16: HbA1c 9.2%  Labs 01/07/16: HbA1c 10.5%; CBC with Hgb elevated at 17.2%; LH 3.9, FSH 2.0; TSH 1.53, free T4 1.3, free T3 3.7; cholesterol 262, triglycerides 171, HDL 40, LDL 188; urinary microalbumin/creatinine ratio 31; CMP normal except for glucose 181  Labs 06/29/15. HbA1c 9.6%  Labs 12/28/14: Hemoglobin A1c 9.5% today, compared with 9.5%, at last visit and with 9.9% at the prior visit.  Labs 08/03/14: HbA1c 9.5%; TSH 0.548,free T4 1.10, free T3 3.7, TPO antibody < 1, TSI 30; CMP normal except glucose 191; urinary microalbumin/creatinine ratio 27; cholesterol 260, triglycerides 90, HDL 51, LDL 191   Labs 10/05/13: HbA1c 9.9%  Labs 04/23/13: CMP normal, except glucose 144; cholesterol 216, triglycerides 97, HDL 39, LDL 158; urinary microalbumin/creatinine ratio 6.9; TSH 0.505, free T4 1.17, free T3 3.8  Labs: 01/21/12: TSH 1.251, free T4 1.10, free T3 3.6, CBC normal, CMP normal except for glucose 206, urinary microalbumin/creatinine ratio 6.3, cholesterol 187, triglycerides 67, HDL 45, LDL 129   Assessment and Plan:   ASSESSMENT:  1. T1DM:   A. His SGs had been mostly <200, reflecting fairly good DM.  His HBA1c was 6.2% in October 2021, without having many low SGs.   B. [name] had done very well at trying to optimize his BG control. This level of BG control without significant hypoglycemia in October 2021 was a testament to the  power of using auto mode in insulin pump-CGM technology and to [name]'s determination to keep his BGs under control.     C. Unfortunately, in the past two months he has had many site problems and sensor problems. BGS and HbA1c are much higher. 2. Hypoglycemia: He has not had many SGs <70. 3. Hypertension: His DBP is borderline elevated today, presumably due to pain and to not sleeping.    4. Combined hyperlipidemia:   A. His lipids in March 2017 were too high, but the lipids in March 2018 were much better on atorvastatin. He discontinued the atorvastatin in mid-2018 due to concern that this medication might be causing his neuromuscular symptoms. His lipids in March 2020 were better.   B. Ms Frederico Hamman re-started his atorvastatin several months ago. We need to obtain a fasting lipid panel.  5. Autonomic neuropathy with tachycardia and gastroparesis: His gastroparesis has improved in parallel with his BG improvement. His heart rate has improved as well. .  6. Obesity: Weight has decreased. He is eating much less. He has also probably lost some muscle.    7-8. Goiter/thyroiditis:   A. His thyroid gland had shrunk back to normal size at a previous visit, but had increased slightly at his last visit and remains slightly enlarged today. His thyroiditis is clinically quiescent.   B. The active thyroiditis that he has had in the past and the  process of waxing and waning of thyroid gland size are c/w evolving Hashimoto's thyroiditis.   C. He was euthyroid in March 2013, but was borderline hyperthyroid in June 2014. He was euthyroid in October 2015, March 2017, March 2018, July 2018, in March 2019, in March 2020, and in July 2021.  9. Fatigue: This problem is much worse today..   10. Obstructive sleep apnea: He did not meet the criteria for OSA, but because of his snoring a dental appliance was suggested. I recommended that he obtain a dental appliance. Unfortunately, his former dentist no longer accepts [employer], so [name] has to find a new dentist. 59. Adult ADD: He  had an appointment with Dr. Piedad Climes, MD in [place] in November 2018. Dr. Shane Crutch is waiting for [name]'s medical situation to stabilize before beginning any new psych meds.   12. Proliferative diabetic retinopathy: As above.  13. URI/nasal congestion/vertigo/allergic rhinitis and conjunctivitis: He is not having these problems today. The monthly injections have really helped him.   14. Neuromuscular symptoms and headaches/increased intracranial pressure:   A. It appeared previously that some of [name]'s headaches were classic tension headaches. When he performed cervical stretching exercises the headache resolved.   B. In the past when Dr. Vickey Huger evaluated [name] for his vague neuromuscular symptoms, she did not find any signs of any serious neurologic disease. His MRI of his brain was unremarkable. At that time his neuromuscular symptoms had resolved.  C. Later, however, the headaches worsened and some of the neuromuscular symptoms recurred. We now know that he had increased ICP and that his headaches improved for a brief time after the LP. He then developed a typical spinal tap headache.  D. In retrospect he may have had intermittent problems with elevated ICP previously.  E. Dr. Vickey Huger suspects that [name]'s ICP was due to the doxycycline. She will follow this issue over time.   F. He appeared to have problems taking his prior dose of Diamox, but is doing better on the combination of a lower dose of Diamox and Topamax.   G. He is being evaluated at [facility] neurology now.  15. Calcified lymph node: It is possible that he may have contracted a fungus while he was working in the [employer] several years ago. His thyroid US showed his goiter, but no intrinsic thyroid abnormality. A partially calcified cervical lymph node on the right was noted, c/w a post infections/inflammatory node. 16. Microalbuminuria: His ratio was  mildly elevated in March 2019. He needed tighter BG and BP control. His ratio in March 2020 and again in July 2021 was very good.  17. Anhedonia: [name] is depressed today. He feels better, but wants to be normal. His sessions with Ms Baltazar Apo, RN, MSN, a psychiatric nurse specialist, have been very beneficial. He still needs to find more sources of fun.  18. Neck pain/Cervical neuritis/neuromuscular pain: [name] has seen Dr. Darrick Penna several times. Dr. Darrick Penna had planned to obtain a neck xray if [name]'s neck did not improve. Unfortunately, [name] missed his lat appointment with Dr. Darrick Penna due to conflicting medical appointment.  I asked [name] to re-schedule his appointment with Dr. Darrick Penna.   19. Cerebral arteriosclerosis: It is unclear to me whether the lesions seen on angiography and MRA are amenable to vascular surgical techniques. I put in a referral today to vascular surgery for a consultation.   PLAN:  1. Diagnostic:  Repeat HbA1c and CBG at his next visit. Repeat lipid panel in future.  2. Therapeutic: Resume auto mode today. See Dr. Ladona Ridgel on Monday. See Mr. Dalbert Garnet for assistance. Follow DM care plan. Continue losartan, ASA, and atorvastatin. Continue his current pump basal rates, ISFs, ICRs, and BG targets.   Basal rates: basal 1 MN:1.20 6 AM: 1.90 2 PM: 1.60 8 PM: 1.65   ISFs: 35   ICRs: MN: 7 8 AM: 4.5 11 AM: 4.5 9 PM: 7   BG targets:  MN: 130-150 6 AM: 120 10 PM 130-150  3. Patient education:   A. We discussed all of the above at great length. We also discussed the need for him to continue to be followed closely by Ms. Karleen Hampshire and  Drs. Neldon Mc, Dohmeier, Rankin, Cira Servant, Ms [place], and Dr. Wylene Simmer when he can again do so. .   B.  I recommended he contact Sports Medicine. 4. Follow-up:  1 month, and add on at noontime on 12/11/20.    Level of Service: This visit lasted in excess of 65 minutes. More than 50% of the visit was devoted to researching [name]'s record,  examining him, and counseling him.  Tillman Sers, MD, CDE Adult and Pediatric Endocrinology

## 2020-12-05 ENCOUNTER — Ambulatory Visit (INDEPENDENT_AMBULATORY_CARE_PROVIDER_SITE_OTHER): Payer: 59 | Admitting: "Endocrinology

## 2020-12-05 ENCOUNTER — Encounter (INDEPENDENT_AMBULATORY_CARE_PROVIDER_SITE_OTHER): Payer: Self-pay | Admitting: "Endocrinology

## 2020-12-05 ENCOUNTER — Other Ambulatory Visit: Payer: Self-pay

## 2020-12-05 VITALS — BP 117/78 | HR 82 | Wt 213.4 lb

## 2020-12-05 DIAGNOSIS — G4733 Obstructive sleep apnea (adult) (pediatric): Secondary | ICD-10-CM

## 2020-12-05 DIAGNOSIS — I10 Essential (primary) hypertension: Secondary | ICD-10-CM

## 2020-12-05 DIAGNOSIS — M542 Cervicalgia: Secondary | ICD-10-CM

## 2020-12-05 DIAGNOSIS — G932 Benign intracranial hypertension: Secondary | ICD-10-CM

## 2020-12-05 DIAGNOSIS — I898 Other specified noninfective disorders of lymphatic vessels and lymph nodes: Secondary | ICD-10-CM

## 2020-12-05 DIAGNOSIS — E782 Mixed hyperlipidemia: Secondary | ICD-10-CM | POA: Diagnosis not present

## 2020-12-05 DIAGNOSIS — E049 Nontoxic goiter, unspecified: Secondary | ICD-10-CM

## 2020-12-05 DIAGNOSIS — E10649 Type 1 diabetes mellitus with hypoglycemia without coma: Secondary | ICD-10-CM

## 2020-12-05 DIAGNOSIS — R5383 Other fatigue: Secondary | ICD-10-CM

## 2020-12-05 DIAGNOSIS — I672 Cerebral atherosclerosis: Secondary | ICD-10-CM

## 2020-12-05 DIAGNOSIS — E063 Autoimmune thyroiditis: Secondary | ICD-10-CM

## 2020-12-05 DIAGNOSIS — E1065 Type 1 diabetes mellitus with hyperglycemia: Secondary | ICD-10-CM

## 2020-12-05 DIAGNOSIS — E083593 Diabetes mellitus due to underlying condition with proliferative diabetic retinopathy without macular edema, bilateral: Secondary | ICD-10-CM

## 2020-12-05 DIAGNOSIS — E1043 Type 1 diabetes mellitus with diabetic autonomic (poly)neuropathy: Secondary | ICD-10-CM

## 2020-12-05 DIAGNOSIS — R4584 Anhedonia: Secondary | ICD-10-CM

## 2020-12-05 DIAGNOSIS — F988 Other specified behavioral and emotional disorders with onset usually occurring in childhood and adolescence: Secondary | ICD-10-CM

## 2020-12-05 LAB — POCT GLUCOSE (DEVICE FOR HOME USE): Glucose Fasting, POC: 165 mg/dL — AB (ref 70–99)

## 2020-12-05 LAB — POCT GLYCOSYLATED HEMOGLOBIN (HGB A1C): Hemoglobin A1C: 7.5 % — AB (ref 4.0–5.6)

## 2020-12-05 MED ORDER — GLUCOSE BLOOD VI STRP
ORAL_STRIP | 6 refills | Status: DC
Start: 2020-12-05 — End: 2020-12-10

## 2020-12-05 NOTE — Patient Instructions (Signed)
Follow up visit in one month.  

## 2020-12-06 ENCOUNTER — Telehealth (INDEPENDENT_AMBULATORY_CARE_PROVIDER_SITE_OTHER): Payer: Self-pay | Admitting: "Endocrinology

## 2020-12-06 NOTE — Telephone Encounter (Signed)
Called and spoke to patient and he has been informed a PA for the strips has been done, just waiting to hear back from insurance.

## 2020-12-06 NOTE — Telephone Encounter (Signed)
Patient is out of test strips he called earlier to see if we could send a prior auth in for him he is  at the pharmacy now. He is just checking to see if we can send that in today?

## 2020-12-06 NOTE — Telephone Encounter (Signed)
Who's calling (name and relationship to patient) : [name]  Best contact number: [phone]  Provider they see: Dr. Fransico Michael  Reason for call: Message left Caller needing physician authorization to get his test strips per his insurance, this is a new script and they need a pre-authorization, caller is out of test strips and asking for this today.   Call ID: [phone]     PRESCRIPTION REFILL ONLY  Name of prescription:  Pharmacy:

## 2020-12-07 NOTE — Progress Notes (Signed)
S:     Chief Complaint  Patient presents with  . Diabetes    Education    Endocrinology provider: Dr. Tobe Sos (no upcoming appt)  Patient referred to me by Dr. Tobe Sos for medtronic MiniMed 770G insulin pump training. 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 Patient recently saw Dr. Tobe Sos on 12/05/20 - he has been having issues with his pump sites and CGM sensors. Patient has been on a Medtronic pump since initial dx of T1DM (~2000s)  Patient presents today with his mother and sister. Patient has been have multiple issues with Guardian sensor and his Medtronic 670G pump. He called Medtronic at the beginning of January and explained his issues. Medtronic sent new Guardian sensors and sent him the Medtronic 770G pump (warranty was over for 670G pump on 11/19/20). He received the newer pump at the end of January/early February. His 770G pump warranty started 11/26/20. He reports he did not pay anything for pump upgrdae since he has already met his deductible for this year. He is interested in other DM technology to assist in DM management. Patient also requires updated test strips prescription for 300 test strips each month. Patient reports having rx for glucagon IM but not Baqsimi; he would prefer Baqsimi.  Insurance Field seismologist  Basal rates MN:1.20 6 AM: 1.90 2 PM: 1.60 8 PM: 1.65  Carb Ratio MN: 7 8 AM: 4.5 11 AM: 4.5 9 PM: 7   Correction Factor Ratio MN 35   Target BG MN: 130-150 6 AM: 120 10 PM 130-150  Infusion Set: Siloutte Paradigm  -Cannula site: 17 mm -Tubing length: 23 in  Lab Results  Component Value Date   HGBA1C 7.5 (A) 12/05/2020   HGBA1C 6.2 (A)  08/16/2020   HGBA1C 6.5 (A) 05/14/2020    No results found for: CPEPTIDE     Component Value Date/Time   CHOL 165 12/30/2018 1146   TRIG 138 12/30/2018 1146   HDL 33 (L) 12/30/2018 1146   CHOLHDL 5.0 (H) 12/30/2018 1146   VLDL 19 12/29/2016 1146   LDLCALC 106 (H) 12/30/2018 1146    Lab Results  Component Value Date   MICRALBCREAT 2 05/14/2020   Patient has significant insulin mediated lipohypertrophy upon entire abdomen upon physical exam  Assessment: DM technology - Verified new pump settings. It appears pump settings [name] put into new pump (Minimid 770G) had different basal rates; updated the basal rates to match last basal rates reported at prior visit with Dr. Tobe Sos. Discussed alternative pump / CGM options. Patient is most interested in changing from Medtronic pump / Guardian sensor --> Tandem pump / Dexcom sensor. Patient contacted Medtronic to determine that he is within 30 days of receiving Medtronic pump and is able to ship it back/discontinue his warranty. Provided patient Dexcom G6 sensor/transmitter sample. Advised him to use Dexcom rather than test strips to calibrate Guardian. Will send in new prescription for test strips. Will initiate paperwork for Tandem (patient completed and signed) and initiate Dexcom G6 CGM PA for insurance coverage. Will keep patient updated as status updates are available. After patient receives pump he will schedule pump start with me (okay to schedule 60 min virtual appt). Patient will send back 770G pump and use 670G pump with Guardian in the interim time period.  Insulin mediated lipohyerptrophy -  Patient has significant lipohyertrophy upon his entire abdomen, which is likely due to how that is the only area he will wear his pump sites. Assessed his arms considering that is the main area he will wear his Guardian - no significant scarring located upon back of arm. Advised patient to stop applying sites to stomach. Advised him to start applying  sites to sides of legs or upper buttocks. Also, advised patient to use more simple pump site for ease of administration; instead of using siloutte paradigm sites use the mio sites. For Tandem will plan for patient to use Autosoft 90 or Autosoft XC sites (9 mm in length). Provided patient sample of Mio site today and had him apply new site to upper buttocks; he was able to do so with ease. Provided 2 other samples as well of Mio sites to use in interim time period while switching to new pump.   Plan: 1. Insulin Pump a. Mail back Medtronic 770G pump b. Use Medtronic 670 with Guardian sensor in interim time frame while we await Tandem insurance processing c. Plan to switch from Medtronic to Tandem 2. Monitoring a. Provided Dexcom sample today b. Will work on [place] authorization so we can plan to switch from [place] to [place] has a diagnosis of diabetes, checks blood glucose readings > 4x per day, treats with an insulin pump and requires frequent adjustments to insulin regimen. This patient will be seen every six months, minimally, to assess adherence to their CGM regimen and diabetes treatment plan. 3. Insulin Mediated Lipohypertrophy a. Stop using abdomen as pump site b. Use upper buttocks/legs as pump sites c. Change from paradigm siloutte infusion set --> mio infusion set with Medtronic (or autosoft 90/autosoft XC with TandeM) 4. Refill a. Sent in refill for larger quantity of test strips (300/30 day supply rather than 200/30 day suppy) b. Sent in [place] rx 5. Follow Up:  a. 1-2 weeks to schedule Tandem pump appt   Written patient instructions provided.    This appointment required 180 minutes of patient care (this includes precharting, chart review, review of results, face-to-face care, etc.).  Thank you for involving clinical pharmacist/diabetes educator to assist in providing this patient's care.  Drexel Iha, PharmD, CPP, CDCES

## 2020-12-09 ENCOUNTER — Other Ambulatory Visit: Payer: Self-pay | Admitting: Neurology

## 2020-12-10 ENCOUNTER — Telehealth (INDEPENDENT_AMBULATORY_CARE_PROVIDER_SITE_OTHER): Payer: Self-pay | Admitting: Pharmacist

## 2020-12-10 ENCOUNTER — Ambulatory Visit (INDEPENDENT_AMBULATORY_CARE_PROVIDER_SITE_OTHER): Payer: 59 | Admitting: Pharmacist

## 2020-12-10 ENCOUNTER — Ambulatory Visit (INDEPENDENT_AMBULATORY_CARE_PROVIDER_SITE_OTHER): Payer: 59 | Admitting: "Endocrinology

## 2020-12-10 ENCOUNTER — Other Ambulatory Visit: Payer: Self-pay

## 2020-12-10 VITALS — Wt 214.4 lb

## 2020-12-10 DIAGNOSIS — E1065 Type 1 diabetes mellitus with hyperglycemia: Secondary | ICD-10-CM

## 2020-12-10 MED ORDER — BAQSIMI TWO PACK 3 MG/DOSE NA POWD: 1.0000 | NASAL | 3 refills | Status: DC

## 2020-12-10 MED ORDER — GLUCOSE BLOOD VI STRP: ORAL_STRIP | 11 refills | Status: DC

## 2020-12-10 NOTE — Telephone Encounter (Signed)
Patient will require Dexcom G6 CGM. ° °Will route note to Kelly Solesbee, RN, for assistance to complete Dexcom G6 CGM prior authorization (assistance appreciated). ° °Thank you for involving clinical pharmacist/diabetes educator to assist in providing this patient's care.  ° °Caley Ciaramitaro, PharmD, CPP, CDCES ° ° °

## 2020-12-11 LAB — POCT GLUCOSE (DEVICE FOR HOME USE): POC Glucose: 259 mg/dl — AB (ref 70–99)

## 2020-12-11 NOTE — Telephone Encounter (Addendum)
Initiated Prior Authorization for [employer] on [employer]  Receiver Key: [id number] -  PA Case ID: [id number] 12/11/2020 - sent to plan 12/12/2020 - N/Aon February 15 This medication or product was previously approved on [id number] from 12/11/2020 to 12/11/2021. You will be able to fill a prescription for this medication at your pharmacy. If your pharmacy has questions regarding the processing of your prescription, please have them call the [facility] pharmacy help desk at [phone]  Sensor Key: [id number] -  PA Case ID: [id number] 12/11/2020 - sent to plan 12/12/2020 - Approvedon February 15 Request Reference Number: [id number]. DEXCOM G6 MIS SENSOR is approved through 12/11/2021. Your patient may now fill this prescription and it will be covered.  Transmitter Key: [id number] -  PA Case ID: [id number] 12/11/2020 - sent to plan 12/12/2020 - N/Aon February 15 This medication or product was previously approved on [id number] from 12/11/2020 to 12/11/2021. You will be able to fill a prescription for this medication at your pharmacy. If your pharmacy has questions regarding the processing of your prescription, please have them call the [facility] pharmacy help desk at [phone]

## 2020-12-13 ENCOUNTER — Encounter (INDEPENDENT_AMBULATORY_CARE_PROVIDER_SITE_OTHER): Payer: Self-pay

## 2020-12-14 ENCOUNTER — Encounter (HOSPITAL_COMMUNITY): Payer: Self-pay | Admitting: Emergency Medicine

## 2020-12-14 ENCOUNTER — Emergency Department (HOSPITAL_COMMUNITY)
Admission: EM | Admit: 2020-12-14 | Discharge: 2020-12-15 | Disposition: A | Discharge status: Home / Self Care | Payer: 59 | Attending: Emergency Medicine | Admitting: Emergency Medicine

## 2020-12-14 ENCOUNTER — Other Ambulatory Visit: Payer: Self-pay

## 2020-12-14 DIAGNOSIS — E1065 Type 1 diabetes mellitus with hyperglycemia: Secondary | ICD-10-CM | POA: Diagnosis not present

## 2020-12-14 DIAGNOSIS — Z7984 Long term (current) use of oral hypoglycemic drugs: Secondary | ICD-10-CM | POA: Diagnosis not present

## 2020-12-14 DIAGNOSIS — R531 Weakness: Secondary | ICD-10-CM | POA: Diagnosis present

## 2020-12-14 DIAGNOSIS — Z794 Long term (current) use of insulin: Secondary | ICD-10-CM | POA: Diagnosis not present

## 2020-12-14 DIAGNOSIS — R634 Abnormal weight loss: Secondary | ICD-10-CM | POA: Diagnosis not present

## 2020-12-14 DIAGNOSIS — J4540 Moderate persistent asthma, uncomplicated: Secondary | ICD-10-CM | POA: Diagnosis not present

## 2020-12-14 DIAGNOSIS — Z7951 Long term (current) use of inhaled steroids: Secondary | ICD-10-CM | POA: Insufficient documentation

## 2020-12-14 DIAGNOSIS — R739 Hyperglycemia, unspecified: Secondary | ICD-10-CM

## 2020-12-14 LAB — URINALYSIS, ROUTINE W REFLEX MICROSCOPIC
Bacteria, UA: NONE SEEN
Bilirubin Urine: NEGATIVE
Glucose, UA: >=500 mg/dL — AB
Hgb urine dipstick: NEGATIVE
Ketones, ur: NEGATIVE mg/dL
Leukocytes,Ua: NEGATIVE
Nitrite: NEGATIVE
Protein, ur: NEGATIVE mg/dL
Specific Gravity, Urine: 1.012 (ref 1.005–1.030)
pH: 7 (ref 5.0–8.0)

## 2020-12-14 LAB — CBG MONITORING, ED
Glucose-Capillary: 285 mg/dL — ABNORMAL HIGH (ref 70–99)
Glucose-Capillary: 212 mg/dL — ABNORMAL HIGH (ref 70–99)
Glucose-Capillary: 154 mg/dL — ABNORMAL HIGH (ref 70–99)

## 2020-12-14 LAB — I-STAT VENOUS BLOOD GAS, ED
Acid-base deficit: 7 mmol/L — ABNORMAL HIGH (ref 0.0–2.0)
Bicarbonate: 19.9 mmol/L — ABNORMAL LOW (ref 20.0–28.0)
Calcium, Ion: 1.25 mmol/L (ref 1.15–1.40)
HCT: 46 % (ref 39.0–52.0)
Hemoglobin: 15.6 g/dL (ref 13.0–17.0)
O2 Saturation: 58 %
Potassium: 3.6 mmol/L (ref 3.5–5.1)
Sodium: 141 mmol/L (ref 135–145)
TCO2: 21 mmol/L — ABNORMAL LOW (ref 22–32)
pCO2, Ven: 42 mmHg — ABNORMAL LOW (ref 44.0–60.0)
pH, Ven: 7.284 (ref 7.250–7.430)
pO2, Ven: 34 mmHg (ref 32.0–45.0)

## 2020-12-14 LAB — BASIC METABOLIC PANEL
Anion gap: 8 (ref 5–15)
BUN: 15 mg/dL (ref 6–20)
CO2: 20 mmol/L — ABNORMAL LOW (ref 22–32)
Calcium: 9.1 mg/dL (ref 8.9–10.3)
Chloride: 108 mmol/L (ref 98–111)
Creatinine, Ser: 0.97 mg/dL (ref 0.61–1.24)
GFR, Estimated: >60 mL/min (ref >60)
Glucose, Bld: 294 mg/dL — ABNORMAL HIGH (ref 70–99)
Potassium: 3.5 mmol/L (ref 3.5–5.1)
Sodium: 136 mmol/L (ref 135–145)

## 2020-12-14 LAB — CBC
HCT: 45.4 % (ref 39.0–52.0)
Hemoglobin: 15.8 g/dL (ref 13.0–17.0)
MCH: 30.7 pg (ref 26.0–34.0)
MCHC: 34.8 g/dL (ref 30.0–36.0)
MCV: 88.3 fL (ref 80.0–100.0)
Platelets: 223 10*3/uL (ref 150–400)
RBC: 5.14 MIL/uL (ref 4.22–5.81)
RDW: 12.5 % (ref 11.5–15.5)
WBC: 6.5 10*3/uL (ref 4.0–10.5)
nRBC: 0 % (ref 0.0–0.2)

## 2020-12-14 LAB — BETA-HYDROXYBUTYRIC ACID: Beta-Hydroxybutyric Acid: 0.22 mmol/L (ref 0.05–0.27)

## 2020-12-14 MED ORDER — DEXCOM G6 RECEIVER DEVI: 1.0000 | 2 refills | Status: AC

## 2020-12-14 MED ORDER — DEXCOM G6 SENSOR MISC: 1.0000 | 11 refills | Status: DC

## 2020-12-14 MED ORDER — DEXCOM G6 TRANSMITTER MISC: 1.0000 | 3 refills | Status: DC

## 2020-12-14 NOTE — Addendum Note (Signed)
Addended by: Buena Irish on: 12/14/2020 09:14 AM   Modules accepted: Orders

## 2020-12-14 NOTE — Telephone Encounter (Signed)
Sent Dexcom G6 CGM prescriptions to following pharmacy  [id number] [place], [place] - [address] AT [facility] [address] [facility] [address]  [address], [place] [address]  Phone:  [phone] Fax:  [phone]  DEA #:  [id number]  DAW Reason: --    Please contact patient to inform him, offer copay card instructions, and advise him he does not need receiver as he is currently using phone (prescription has mainly been sent in just in case he requires a dexcom receiver over the next year).  Thank you for involving clinical pharmacist/diabetes educator to assist in providing this patient's care.   Zachery Conch, PharmD, CPP, CDCES

## 2020-12-14 NOTE — ED Notes (Signed)
Pt stated he was stepping outside, he will be back.

## 2020-12-14 NOTE — Telephone Encounter (Signed)
Please reach out to patient to schedule 60 min virtual or inperson (per patient preference) appt to review insulin pump site application technique.  Thank you for involving clinical pharmacist/diabetes educator to assist in providing this patient's care.   Zachery Conch, PharmD, CPP, CDCES

## 2020-12-14 NOTE — Telephone Encounter (Signed)
Relayed Dr. Bruna Potter message, will sent co pay card directions by my chart.    He asked about his pump sites, he stated he has tried on his back.  He has gotten samples of other insertion sites & using them on abdomin.  He went through 7 sites yesterday and is still having trouble.  He doesn't think the site is currently working.  His blood sugar went up to 190 with just chicken.   Medtronic sent him a box and the rep sent him some others to try.   No luck with Mio, sure set and quick T.  He has gone back to silhouette sites but doesn't think he is getting enough insulin through it.  He is open to an appointment (in person or virtual) with Dr. Ladona Ridgel to review if he is inserting them correctly or to help him troubleshoot the sites.

## 2020-12-14 NOTE — ED Triage Notes (Signed)
Type 1 diabetic arrives to ED with chief complaint of elevated glucose and believes this could be due to his insulin pump. He feels weak and not well for over 1 week.

## 2020-12-15 LAB — CK: Total CK: 77 U/L (ref 49–397)

## 2020-12-15 LAB — HEPATIC FUNCTION PANEL
ALT: 26 U/L (ref 0–44)
AST: 16 U/L (ref 15–41)
Albumin: 4.2 g/dL (ref 3.5–5.0)
Alkaline Phosphatase: 72 U/L (ref 38–126)
Bilirubin, Direct: 0.1 mg/dL (ref 0.0–0.2)
Indirect Bilirubin: 0.2 mg/dL — ABNORMAL LOW (ref 0.3–0.9)
Total Bilirubin: 0.3 mg/dL (ref 0.3–1.2)
Total Protein: 6.7 g/dL (ref 6.5–8.1)

## 2020-12-15 NOTE — Discharge Instructions (Addendum)
The test that you had done today did not show the reason why your blood sugars have been shooting up and why you have been losing weight and muscle mass.  Please follow-up with your primary care provider and your endocrinologist for further evaluation as an outpatient.  Return to the emergency department if symptoms are getting worse.

## 2020-12-15 NOTE — ED Provider Notes (Signed)
[facility] EMERGENCY DEPARTMENT Provider Note   CSN: [id number] Arrival date & time: 12/14/20  1457   History Chief Complaint  Patient presents with  . Hyperglycemia    [name] is a 40 y.o. male.  The history is provided by the patient.  Hyperglycemia He has history of hypertension, diabetes, hyperlipidemia, goiter, diabetic retinopathy and comes in complaining of difficulty with his insulin pump over the last month.  He has had 2 use multiple different sites to have the infusion and he has had some problems with blood sugars spiking after eating.  He states that after eating very little, blood sugar would go to greater than 300 or 400.  Because of this, he has been afraid to eat and has been losing weight.  He also is complaining of generalized muscle aches and progressive muscle weakness.  He denies fever or chills.  He denies any vomiting or diarrhea.  He states he spoke with his endocrinologist's office today and the endocrinologist was not in the office but he was told to come to the ED to be admitted.  Past Medical History:  Diagnosis Date  . ADHD (attention deficit hyperactivity disorder)   . Asthma   . Autonomic neuropathy due to diabetes (HCC)   . Diabetes (HCC)   . Fatigue   . Goiter   . High blood pressure   . High cholesterol   . Hypercholesterolemia   . Hypertension   . Hypoglycemia associated with diabetes (HCC)   . Obesity   . Tachycardia   . Type 1 diabetes mellitus not at goal [facility])   . Uncontrolled DM with microalbuminuria or microproteinuria     Patient Active Problem List   Diagnosis Date Noted  . Nuclear sclerotic cataract of both eyes 09/12/2020  . Ischemic optic neuritis, right 07/19/2020  . Cerebral arteriosclerosis 07/19/2020  . Photophobia, left eye 07/19/2020  . Optic neuritis, posterior 07/10/2020  . Color vision deficiency 07/10/2020  . Vitreomacular adhesion of left eye 07/03/2020  . H/O visual field defect  06/14/2020  . Stable treated proliferative diabetic retinopathy of left eye with macular edema determined by examination associated with type 1 diabetes mellitus (HCC) 05/28/2020  . Benign intracranial hypertension 05/28/2020  . Diabetic maculopathy of right eye with proliferative retinopathy determined by examination associated with type 1 diabetes mellitus (HCC) 05/28/2020  . Vitreous hemorrhage of right eye (HCC) 05/28/2020  . Pain in joint, shoulder region 04/17/2020  . Finger numbness 04/17/2020  . Moderate persistent asthma without complication 12/27/2019  . Contusion of back, right, initial encounter 04/12/2019  . Left shoulder pain 04/12/2019  . Acute pain of left wrist 04/12/2019  . Pseudotumor cerebri syndrome 02/16/2019  . Class 1 obesity with serious comorbidity and body mass index (BMI) of 32.0 to 32.9 in adult 02/16/2019  . Idiopathic normal pressure hydrocephalus (HCC) 12/15/2018  . Severe persistent asthma with acute exacerbation 08/31/2018  . Seasonal and perennial allergic rhinitis 08/31/2018  . Mild intermittent asthma without complication 07/12/2018  . Controlled type 1 diabetes mellitus with stable proliferative retinopathy of both eyes (HCC) 06/09/2018  . Sore throat 04/26/2018  . Moderate persistent asthma with acute exacerbation 04/26/2018  . Other allergic rhinitis 04/26/2018  . Gastroesophageal reflux disease 04/26/2018  . Food allergy 04/26/2018  . Pseudotumor cerebri 03/25/2018  . Morbid obesity with BMI of 40.0-44.9, adult (HCC) 03/25/2018  . Altitude sickness, subsequent encounter 03/25/2018  . Encounter for medication management 03/25/2018  . Snoring 03/25/2018  . Increased  intracranial pressure 03/20/2018  . Low back pain 03/10/2018  . Cervicalgia of occipito-atlanto-axial region 08/31/2017  . Type 1 diabetes mellitus with complication (HCC) 08/31/2017  . Calcified lymph nodes 06/04/2017  . Insulin pump in place 05/28/2017  . Neuropathy 05/28/2017  .  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 Other symptoms and signs involving the nervous system 05/28/2017  . Nightmares REM-sleep type 05/28/2017  . Proliferative diabetic retinopathy, right eye (HCC) 04/11/2017  . Benign paroxysmal positional vertigo 03/29/2017  . Costochondritis 03/06/2017  . Acute upper respiratory infection 03/06/2017  . Combined hyperlipidemia 02/11/2016  . Metatarsalgia of right foot 02/07/2016  . Lactose intolerance 01/07/2016  . Inappropriate sinus tachycardia 06/29/2015  . Thoracic back pain 11/02/2013  . Type 1 diabetes mellitus not at goal [facility](HCC)   . Goiter   . Severe obesity (BMI 35.0-39.9) with comorbidity (HCC)   . Hypercholesterolemia   . ADHD (attention deficit hyperactivity disorder)   . Fatigue   . Tachycardia   . Autonomic neuropathy associated with type 1 diabetes mellitus (HCC)   . Hypertension   . Uncontrolled DM with microalbuminuria or microproteinuria   . Hypoglycemia associated with diabetes (HCC)   . DIABETES MELLITUS, I 12/24/2006  . ATTENTION DEFICIT, W/HYPERACTIVITY 12/24/2006    Past Surgical History:  Procedure Laterality Date  . EYE SURGERY Right 05/30/2020   Vitrectomy, Dr. Luciana Axeankin  . EYE SURGERY  04/2020  . IR 3D INDEPENDENT [facility]  07/16/2020  . IR ANGIO INTRA EXTRACRAN SEL COM CAROTID INNOMINATE BILAT MOD SED  07/16/2020  . IR ANGIO VERTEBRAL SEL SUBCLAVIAN INNOMINATE UNI L MOD SED  07/16/2020  . IR ANGIO VERTEBRAL SEL VERTEBRAL UNI R MOD SED  07/16/2020  . IR US GUIDE VASC ACCESS RIGHT  07/16/2020  . REFRACTIVE SURGERY     x9  . WISDOM TOOTH EXTRACTION         Family History  Problem Relation Age of Onset  . Dementia Paternal Aunt   . Schizophrenia Paternal Aunt   . Cancer Maternal Grandmother   . Diabetes Maternal Grandmother        T2 DM  . Cancer Maternal Grandfather   . Heart disease Father   . Thyroid disease Neg Hx     Social History   Tobacco  Use  . Smoking status: Never Smoker  . Smokeless tobacco: Never Used  Vaping Use  . Vaping Use: Never used  Substance Use Topics  . Alcohol use: No  . Drug use: No    Home Medications Prior to Admission medications   Medication Sig Start Date End Date Taking? Authorizing Provider  acetaminophen (TYLENOL) 500 MG tablet Take 1,000 mg by mouth 2 (two) times daily as needed.  Patient not taking: No sig reported    [provider]  acetaZOLAMIDE (DIAMOX) 500 MG capsule TAKE 2 CAPSULES BY MOUTH EVERY MORNING AND ONE IN THE EVENING 11/14/20   Dohmeier, Porfirio Mylararmen, MD  albuterol (PROAIR HFA) 108 (90 Base) MCG/ACT inhaler Inhale 2 puffs into the lungs every 6 (six) hours as needed for wheezing or shortness of breath. Patient not taking: No sig reported 07/14/18   Kozlow, Alvira PhilipsEric J, MD  albuterol (PROAIR HFA) 108 (90 Base) MCG/ACT inhaler Inhale 2 puffs into the lungs every 4 (four) hours as needed for wheezing or shortness of breath. Patient not taking: No sig reported 12/27/19   Hetty BlendAmbs, Anne M, FNP  albuterol (PROVENTIL) (2.5 MG/3ML) 0.083% nebulizer solution Take 3 mLs (2.5  mg total) by nebulization every 4 (four) hours as needed for wheezing or shortness of breath. Patient not taking: No sig reported 08/31/18   Alfonse Spruce, MD  ALPRAZolam Prudy Feeler) 0.25 MG tablet Take by mouth. 05/01/20   [provider]  atorvastatin (LIPITOR) 20 MG tablet Take 20 mg by mouth daily.  08/08/18   [provider]  AUVI-Q 0.3 MG/0.3ML SOAJ injection Use as directed for life-threatening allergic reaction. Patient not taking: No sig reported 12/27/19   Hetty Blend, FNP  budesonide (PULMICORT) 0.5 MG/2ML nebulizer solution Take 2 mLs (0.5 mg total) by nebulization as directed. Patient not taking: No sig reported 07/14/18   Kozlow, Alvira Philips, MD  budesonide-formoterol [facility]) 160-4.5 MCG/ACT inhaler Inhale 2 puffs into the lungs 2 (two) times daily. Patient not taking: No sig reported 09/14/18    Alfonse Spruce, MD  calcium carbonate (OSCAL) 1500 (600 Ca) MG TABS tablet Take by mouth 2 (two) times daily with a meal.    [provider]  Continuous Blood Gluc Receiver (DEXCOM G6 RECEIVER) DEVI 1 Device by Does not apply route as directed. 12/14/20    Stall, MD  Continuous Blood Gluc Sensor (DEXCOM G6 SENSOR) MISC Inject 1 applicator into the skin as directed. (change sensor every 10 days) 12/14/20    Stall, MD  Continuous Blood Gluc Transmit (DEXCOM G6 TRANSMITTER) MISC Inject 1 Device into the skin as directed. (re-use up to 8x with each new sensor) 12/14/20    Stall, MD  Glucagon (BAQSIMI TWO PACK) 3 MG/DOSE POWD Place 1 spray into the nose as directed. 12/10/20    Stall, MD  glucose blood test strip Use to check blood sugar up to 10x daily 12/10/20 12/10/21   Stall, MD  HUMALOG 100 UNIT/ML injection INJECT 300 UNITS UNDER THE SKIN VIA INSULIN PUMP EVERY 48 TO 72 HOURS PER HYPERGLYCEMIA AND DKA PROTOCOLS 01/31/20    Stall, MD  insulin lispro (HUMALOG KWIKPEN) 100 UNIT/ML KwikPen Inject up to 50 units per day Patient not taking: Reported on 12/10/2020 11/22/20    Stall, MD  Insulin Pen Needle (PEN NEEDLES) 32G X 4 MM MISC Use to inject insulin 6x per day 11/22/20    Stall, MD  levocetirizine (XYZAL) 5 MG tablet Take 1 tablet (5 mg total) by mouth daily as needed for allergies. Patient not taking: Reported on 12/10/2020 07/24/20   Hetty Blend, FNP  losartan (COZAAR) 50 MG tablet TAKE 1 TABLET(50 MG) BY MOUTH DAILY 08/17/20    Stall, MD  methocarbamol (ROBAXIN) 500 MG tablet Take 1 tablet (500 mg total) by mouth 2 (two) times daily. Patient not taking: No sig reported 04/26/20   Enid Baas, MD  Mometasone Furoate [facility] HFA) 200 MCG/ACT AERO For asthma flare, begin Asmanex 200-2 puffs twice a day for 1-2 weeks or until cough and wheeze free Patient not taking: No sig reported  07/25/20   Hetty Blend, FNP  montelukast (SINGULAIR) 10 MG tablet TAKE 1 TABLET(10 MG) BY MOUTH AT BEDTIME 07/24/20   Ambs, Norvel Richards, FNP  Multiple Vitamin (MULTIVITAMIN WITH MINERALS) TABS tablet Take 1 tablet by mouth daily.    [provider]  mupirocin ointment (BACTROBAN) 2 % mupirocin 2 % topical ointment  APP EXT IEN BID Patient not taking: No sig reported    [provider]  NUCALA 100 MG/ML SOAJ INJECT 100MG  SUBCUTANEOUSLY EVERY 30 DAYS (GIVEN AT MD  OFFICE) 03/22/20  Alfonse Spruce, MD  nystatin (MYCOSTATIN) 100000 UNIT/ML suspension Take by mouth. Patient not taking: No sig reported 08/08/20   [provider]  omeprazole (PRILOSEC) 20 MG capsule Take 20 mg by mouth daily as needed (for reflux).  Patient not taking: No sig reported    [provider]  topiramate (TOPAMAX) 50 MG tablet TAKE 1 TABLET(50 MG) BY MOUTH TWICE DAILY 12/11/20   Dohmeier, Porfirio Mylar, MD  Vitamin D, Ergocalciferol, (DRISDOL) 1.25 MG (50000 UNIT) CAPS capsule Take 50,000 Units by mouth every 7 (seven) days.    [provider]    Allergies    Flonase [fluticasone], Sulfa antibiotics, Molds & smuts, Sulfites, Tetracyclines & related, and White birch  Review of Systems   Review of Systems  All other systems reviewed and are negative.   Physical Exam Updated Vital Signs BP (!) 147/64   Pulse (!) 56   Temp 98.1 F (36.7 C) (Oral)   Resp 14   SpO2 96%   Physical Exam Vitals and nursing note reviewed.   40 year old male, resting comfortably and in no acute distress. Vital signs are significant for mildly elevated blood pressure and slightly slow heart rate. Oxygen saturation is 96%, which is normal. Head is normocephalic and atraumatic. PERRLA, EOMI. Oropharynx is clear. Neck is nontender and supple without adenopathy or JVD. Back is nontender and there is no CVA tenderness. Lungs are clear without rales, wheezes, or rhonchi. Chest is nontender. Heart has  regular rate and rhythm without murmur. Abdomen is soft, flat, nontender without masses or hepatosplenomegaly and peristalsis is normoactive. Extremities have no cyanosis or edema, full range of motion is present. Skin is warm and dry without rash. Neurologic: Mental status is normal, cranial nerves are intact.  Strength is 5/5 in both arms and both legs both proximally and distally.  ED Results / Procedures / Treatments   Labs (all labs ordered are listed, but only abnormal results are displayed) Labs Reviewed  BASIC METABOLIC PANEL - Abnormal; Notable for the following components:      Result Value   CO2 20 (*)    Glucose, Bld 294 (*)    All other components within normal limits  URINALYSIS, ROUTINE W REFLEX MICROSCOPIC - Abnormal; Notable for the following components:   Glucose, UA >=500 (*)    All other components within normal limits  HEPATIC FUNCTION PANEL - Abnormal; Notable for the following components:   Indirect Bilirubin 0.2 (*)    All other components within normal limits  CBG MONITORING, ED - Abnormal; Notable for the following components:   Glucose-Capillary 285 (*)    All other components within normal limits  CBG MONITORING, ED - Abnormal; Notable for the following components:   Glucose-Capillary 212 (*)    All other components within normal limits  I-STAT VENOUS BLOOD GAS, ED - Abnormal; Notable for the following components:   pCO2, Ven 42.0 (*)    Bicarbonate 19.9 (*)    TCO2 21 (*)    Acid-base deficit 7.0 (*)    All other components within normal limits  CBG MONITORING, ED - Abnormal; Notable for the following components:   Glucose-Capillary 154 (*)    All other components within normal limits  CBC  BETA-HYDROXYBUTYRIC ACID  CK   Procedures Procedures   Medications Ordered in ED Medications - No data to display  ED Course  I have reviewed the triage vital signs and the nursing notes.  Pertinent labs & imaging results that were available  during my  care of the patient were reviewed by me and considered in my medical decision making (see chart for details).  MDM Rules/Calculators/A&P Labile blood sugars, weight loss, myalgias.  Labs today are reassuring.  Initial glucose was 294, subsequent ones have dropped to 212 and 154.  Renal function is normal and CBC is normal.  Given his complaints of weight loss and muscle aches will check CK level and hepatic function panel.  Old records are reviewed, and he had been seen by his endocrinologist on 2/9 with basically the same complaints.  In care everywhere, comprehensive metabolic panel was drawn on 11/23/2020 at which time all of the values were normal.  CK and hepatic function panel are both normal.  Cause for patient's symptoms are unclear, but will need to be pursued as an outpatient.  He is referred back to his primary care provider and his endocrinologist.  Final Clinical Impression(s) / ED Diagnoses Final diagnoses:  Hyperglycemia  Weight loss    Rx / DC Orders ED Discharge Orders    None       Dione Booze, MD 12/15/20 [phone]

## 2020-12-20 ENCOUNTER — Encounter (INDEPENDENT_AMBULATORY_CARE_PROVIDER_SITE_OTHER): Payer: Self-pay

## 2020-12-24 NOTE — Progress Notes (Signed)
Subjective:  Patient Name: [name] Date of Birth: Aug 28, 1981  MRN: [id number]  [name]  presents at his clinic visit today for follow-up of his type 1 diabetes mellitus, goiter, obesity, combined hyperlipidemia, adult ADHD, fatigue, autonomic neuropathy, tachycardia, hypertension, microalbuminuria, hypoglycemia, sinusitis, bronchitis, allergies, proliferative retinopathy, and pseudotumor cerebri/increased intracranial hypertension.   HISTORY OF PRESENT ILLNESS:   [name] is a 40 y.o. Caucasian gentleman. [name] was unaccompanied.  1. The patient was first referred to me on 01/08/2006 by his family physician, Dr. Andreas Blower, for evaluation and management of type 1 diabetes mellitus and related problems. The patient was 40 years old.  A. [name] had been diagnosed with type 1 diabetes somewhere in 2001-2002, at the age of 20-19. He was initially diagnosed with type 2 diabetes mellitus, but was later re-classified as type 1 diabetes mellitus. He had been followed by Dr. Romilda Garret, staff endocrinologist at [facility]. [name] was started on an insulin pump approximately 18 months prior to first seeing me. His pump was a Medtronic Paradigm 712. His blood glucose control was fair-to-poor. He was frequently not checking blood sugars or taking insulin boluses as often as he needed to. He frequently noted fast heart rate. The patient's past medical history was positive for hypertension, for which he was taking lisinopril, and for what his mother called "extreme ADHD". He had previously been taking ADHD medicines but had discontinued them due to adverse effects. He had prior ankle injuries and knee injuries, to include tears of the lateral menisci bilaterally. He had not had any surgeries. 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 As a result, the patient was on the road a lot and spent much of his time outdoors at  field sites. He did not use tobacco or drugs, but did drink alcohol occasionally.  B. On physical examination, his weight was 234 pounds, his height was 70 inches, his BMI was 33.2. Blood pressure was 128/70. Hemoglobin A1c was 9.5%. Heart rate was 96. He had normal affect and fair insight. He had a 25-30 gram goiter. He had normal 1+ DP pulses in his feet. He had normal sensation in his feet to touch, vibration, and monofilament. His CMP was normal except for glucose of 251. His cholesterol was 257, triglycerides 143, HDL 49, and LDL 179. TSH was 1.017, free T4 was 1.06, and free T3 was 3.3. His urinary microalbumin: creatinine ratio was 20.1 (normal less than 30).   C. The patient clearly needed better blood glucose control, which would only occur if he had better adherence to his plan. He appeared to have autonomic neuropathy, manifested by inappropriate sinus tachycardia.  His thyroid goiter suggested that he might have evolving Hashimoto's disease. He stated that he had lost some weight recently. I encouraged him to check his blood sugars more frequently and to take both correction boluses and food boluses.  2. During the past fifteen years, [name] has had to deal with many different problems:  A. T1DM/hypoglycemia:    1). 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 His hemoglobin A1c values had varied from 8.3-11.2%.   2). After starting him on a Medtronic 670G insulin pump and Guardian 3 sensor on 01/12/17, his BG control has improved significantly, despite the illnesses described below.  B. DM complications: .   1). He has bilateral diabetic retinopathy and is followed by his retinologist, Dr. Deloria Lair, MD.     A). He saw Dr. Zadie Rhine in October 2020 and again in February 2021. There were no new signs of diabetic eye disease.     2). At [facility]  visit with me in July 2021 he asked me to arrange a second opinion retinal consultation for him. I arranged for him to see Dr. Dennard Nip in the [place].    2). He has autonomic neuropathy manifested by inappropriate sinus tachycardia and gastroparesis causing postprandial bloating and GERD. These problems have varied inversely with his degree of BG control.   C. Combined hyperlipidemia:     1). On 12/03/10 his total cholesterol was 270, triglycerides 94, HDL 50, and LDL 201. I started him on Crestor, 10 mg per day. He has since begun treatment with atorvastatin and his cholesterol values had improved.    2). On 12/29/16 his lipids had improved, but his cholesterol values were still elevated. After developing the illnesses noted below, his atorvastatin was discontinued due to concerns that he might be having adverse reactions to the atorvastatin.    D. Allergies, sinusitis, bronchitis, headaches, muscle pains/spasms:   1). Unfortunately, beginning in about June 2018, [name] began to develop recurrent episodes of allergic rhinitis, head fullness, sinusitis, and bronchitis. He also developed headaches, muscle pains and spasms, lethargy, fatigue, and weakness that were presumably due in large part to severe allergies. His atorvastatin and several other medications were stopped at that time. Despite receiving excellent care from his allergist, to include allergy injections and biologic injections, [name] allergic issues persisted.    2). He saw Dr. Carmelina Peal on 03/15/19 and again in February 2021. [name] allergies and asthma have been okay since has been wearing his covid mask. He has not needed steroids recently. His second sleep study was not bad enough to start on C-pap. In the past 8-10 months these illnesses/problems have largely resolved, in part due to taking a monthly biologic allergy injection.   E. Increased intracranial pressure:    1). [name] was seen in consultation by Dr. Larey Seat, neurology, on  05/28/17 for headaches, tremors of his right hand and foot, shaky vision, and symptoms c/w OSA. His initial sleep study did not show any significant amount of OSA.    2). On 03/08/18, Dr.Dohmeier was notified that [place] eye doctor had seen signs of fluid build up in his right eye. An LP was performed in radiology on 03/09/18. Opening pressure was 28 cm of water, c/w increased intracranial pressure and pseudotumor cerebri. This problem was initially though to be due to tetracycline usage, so TCN was discontinued and Diamox begun. The Diamox doses have been changed over time.    3). On 01/03/19 he had an LP that showed elevated pressure. His diamox dose was increased somewhat. Dr. Brett Fairy also started him on Topamax. He had a follow up visit with Dr. Brett Fairy on 02/23/20. She continued him on low doses of both medications. He still has some adverse effects of the Diamox, but not as bad. He still has some mild fatigue.  G. Adult ADD/depression: [name] was evaluated by Dr Carlena Hurl, a noted neuropsychiatrist in [place] in 2018. At his last follow up visit in November 2018, Dr. Wylene Simmer decided to wait until [name] overall health situation  stabilized before trying any new medications.   H. General physical and emotional health: [name] has been very sick  for much of the past 3 years. He has been unable to work full-time and has become almost confined to his home. He has become increasingly anhedonic over time.   I. Neck pains, decreased cervical range of motion, and shoulder girdle pains:   1). Several months ago he began to develop pains in his left upper chest and shoulder area. He saw Dr. Oneida Alar. The pains then went into his right upper chest and shoulder, then into his shoulder blades. Now he can't bend his neck. [place] of his left shoulder was negative.    2). He missed his follow up appointment with Dr. Oneida Alar due to other conflicting medical appointments. I asked him to re-schedule that appointment.  J. He  had an MRI and an MRA of his brain. The MRI of the brain and brainstem down to C3 was normal. The MRA showed the following:  Moderate stenosis of the M1 segment of the left middle cerebral artery.  Negligible flow through the A1 segment of the left anterior cerebral artery.  This could be due to a hypoplastic/aplastic segment, a normal variant.  This could also be due to severe stenosis.  The left A2 segment derived this flow from the right to through the anterior communicating artery a "moderate stenosis of the M1 segment of the left middle cerebral artery: He then had an angiography procedure to evaluate his bilateral carotid  And innominate arterial circulation. That study showed: Approximately 65-70% stenosis of the left internal carotid artery supraclinoid segment secondary to a mildly irregular intracranial arteriosclerotic plaque. No opacification of the left anterior cerebral A1 segment most likely represents a pathologic intracranial occlusion. 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 Approximately 10 mm x 8.3 mm pyramidal configured high-riding jugular bulb. [name] was told to" take 325 mg of ASA daily as a secondary stroke prevention strategy".  K. Following the angiography procedure he developed bilateral palmar hand numbness. Fortunately, these symptoms are improving   L. He has an asthma consultation with Ms Juliann Pares, [place], on 07/24/20. He had mild restrictive disease, essentially unchanged.  M. He had an evaluation by Dr. Celine Ahr, [facility], on 06/19/20 and a follow up visit on 10.01/21. The cardiologist recommended continuing his  ASA and statin therapy, but did not feel any cardiology follow up was needed.     3. [name] last [facility] visit was on 12/05/2020. At that visit we continued his insulin pump settings.   A. In the interim he  has not been well. He has cut way back on his carb intake in order to control his BGs, but is now losing weight..   B. On 12/14/20 he went to the ED complaining of difficulty in controlling BGs and diffuse muscle aches, and progressive muscle weakness. He is afraid to eat and is losing weight.  C. Site problems:   1). Some of his insulins have been older that 28-30 days.   2). He is not having any pump malfunctions.   3). He has not had any new illnesses per se. He continues to have soreness in his joints and in his muscles.    4). He has been eating mostly grilled chicken and salad. He drinks mostly water. Yesterday he only ate 6 chicken nuggets.   5). He continues to have site problems. He has trouble finding places for sites to work. He  continues to use his lateral abdomen, but not his mid-abdomen. He has tried using his back buttocks, but can't seem to keep the sites in. He has tried the Advanced Mio 9 mm sites, the Quick-set 6 mm sites, the Silhouette 17 mm sites, and the Sure-T 6 mm sites, but none seem to do well.    6). When he tries to insert sites, he often has more pain than he used to have. He is having more problems with putting in his insertion sites. Even when the sites work, he does not have as much responsiveness to insulin as he used to have.    7). He still has some bruising and bleeding at his pump sites. He stopped taking ASA about two months ago with some improvement.    8). His sensors and transmitter have been working, but don't last as long.   D. He says that his health is worse. He feels that he is getting weaker. Muscle spasms have been worse. He is losing strength. He is having more pains in his ankles, shins, and wrists. His headaches have not been as bad.   E. He was found to have vitamin D level of 10. He is now taking 50,00 IU/week.   F. He saw a neurologist at [facility] on 10/15/20. MRA of the brain did not add any additional info. [name] has a follow up visit later in March  2022.  G. He continues to see his therapist, Ms. [facility], every week. He is using alprazolam, 0.25 mg at bedtime when needed for anxiety. His PCP will prescribe the medication for him.  He thinks the medication and the therapy are helping him.   H. His nausea only occurs occasionally. He does not usually take omeprazole.   I. He continues to have problems with lactose intolerance. When he avoids lactose, however, he is asymptomatic. Some foods such as shellfish cause his throat to swell up.   J. He takes losartan, 50 mg/day. He rarely has any coughing unless his allergies act up. He also takes atorvastatin several months ago.   H. He saw Dr Dennard Nip on 06/27/20.  His eyes looked relatively fine. She gave him an injection in his left eye. He appreciated her consultation, but has difficulty obtaining transportation back and forth to [place], so wants to continue with his retinologist, Dr. Zadie Rhine, here in [place].   I. He had an ophthalmology appointment at [facility] 10/23/20. He saw the [facility] neuro-retinal specialist on 12/13/20.  There was no evidence of progression.   J. His insurance plan will not allow him to have any more than 200 BG test strips per month.   4. Pertinent Review of Systems: Constitutional: [name] feels "a little run down, fatigued, and weak". His sleep quality is not good, mostly due to pain and other neurologic symptoms.   Eyes: As above. The visual color changes, muscle spasms, and tingling that were attributed to Diamox now bother him only "occasionally" since reducing the Diamox dose. Neck: He still occasionally has intermittent anterior neck swelling and tenderness.  Lungs: Breathing has been "okay", but he still sometimes has SOB with exertion.  Heart: He thinks his heart beats about the same. Gastrointestinal: As above. He does not have much postprandial bloating or reflux. when he takes his omeprazole. He does have some pains in his abdominal muscles at times.  He does not  have other complaints of excessive hunger, heartburn, upset stomach, stomach aches or pains, or diarrhea while taking omeprazole. Unfortunately,  when he feels better, he stops taking the omeprazole.  Hands: As above Legs: As above. He still has spasms of the larger muscles of his legs, but at other times they feel like they are vibrating.  These sensations may last for up to a few hours. Muscle mass and strength seem normal. There are no other complaints of numbness, tingling, burning, or pain. No edema is noted. Feet: There are no obvious foot problems now. He still has episodic foot pains, but not often. There are no complaints of numbness, or burning.  No edema is noted. Hypoglycemia: He has not had many low BG alerts.  Emotional: "I'm pretty tired of all of this."    5. Pump printout: He changes sites every 1-2 days. Average BG is 156, compared with 213 at his last visit and with 162 at his prior visit.    6. CGM printout: He has been in auto mode 95%. He wears his sensor 91%. Average SG is 155, compared with 204 at his last visit and with  148 at his last prior. Time in Range is 79%. Time above range is 20%. SGs average about 150 at midnight. SGs decrease to about 140 about 11 AM, increase to about 190 at 3 PM, decreased to about 170 at 8 PM, then decrease to midnight   PAST MEDICAL, FAMILY, AND SOCIAL HISTORY:  Past Medical History:  Diagnosis Date  . ADHD (attention deficit hyperactivity disorder)   . Asthma   . Autonomic neuropathy due to diabetes ([place])   . Diabetes ([place])   . Fatigue   . Goiter   . High blood pressure   . High cholesterol   . Hypercholesterolemia   . Hypertension   . Hypoglycemia associated with diabetes ([place])   . Obesity   . Tachycardia   . Type 1 diabetes mellitus not at goal [facility])   . Uncontrolled DM with microalbuminuria or microproteinuria     Family History  Problem Relation Age of Onset  . Dementia Paternal Aunt   . Schizophrenia Paternal Aunt   .  Cancer Maternal Grandmother   . Diabetes Maternal Grandmother        T2 DM  . Cancer Maternal Grandfather   . Heart disease Father   . Thyroid disease Neg Hx      Current Outpatient Medications:  .  acetaZOLAMIDE (DIAMOX) 500 MG capsule, TAKE 2 CAPSULES BY MOUTH EVERY MORNING AND ONE IN THE EVENING, Disp: 90 capsule, Rfl: 1 .  ALPRAZolam (XANAX) 0.25 MG tablet, Take by mouth., Disp: , Rfl:  .  atorvastatin (LIPITOR) 20 MG tablet, Take 20 mg by mouth daily. , Disp: , Rfl: 0 .  calcium carbonate (OSCAL) 1500 (600 Ca) MG TABS tablet, Take by mouth 2 (two) times daily with a meal., Disp: , Rfl:  .  Continuous Blood Gluc Receiver (DEXCOM G6 RECEIVER) DEVI, 1 Device by Does not apply route as directed., Disp: 1 each, Rfl: 2 .  Continuous Blood Gluc Sensor (DEXCOM G6 SENSOR) MISC, Inject 1 applicator into the skin as directed. (change sensor every 10 days), Disp: 3 each, Rfl: 11 .  Continuous Blood Gluc Transmit (DEXCOM G6 TRANSMITTER) MISC, Inject 1 Device into the skin as directed. (re-use up to 8x with each new sensor), Disp: 1 each, Rfl: 3 .  Glucagon (BAQSIMI TWO PACK) 3 MG/DOSE POWD, Place 1 spray into the nose as directed., Disp: 2 each, Rfl: 3 .  glucose blood test strip, Use to check blood sugar up to  10x daily, Disp: 300 each, Rfl: 11 .  HUMALOG 100 UNIT/ML injection, INJECT 300 UNITS UNDER THE SKIN VIA INSULIN PUMP EVERY 48 TO 72 HOURS PER HYPERGLYCEMIA AND DKA PROTOCOLS, Disp: 120 mL, Rfl: 5 .  Insulin Pen Needle (PEN NEEDLES) 32G X 4 MM MISC, Use to inject insulin 6x per day, Disp: 200 each, Rfl: 5 .  losartan (COZAAR) 50 MG tablet, TAKE 1 TABLET(50 MG) BY MOUTH DAILY, Disp: 90 tablet, Rfl: 1 .  montelukast (SINGULAIR) 10 MG tablet, TAKE 1 TABLET(10 MG) BY MOUTH AT BEDTIME, Disp: 30 tablet, Rfl: 5 .  Multiple Vitamin (MULTIVITAMIN WITH MINERALS) TABS tablet, Take 1 tablet by mouth daily., Disp: , Rfl:  .  NUCALA 100 MG/ML SOAJ, INJECT 100MG SUBCUTANEOUSLY EVERY 30 DAYS (GIVEN AT MD   OFFICE), Disp: 1 mL, Rfl: 11 .  topiramate (TOPAMAX) 50 MG tablet, TAKE 1 TABLET(50 MG) BY MOUTH TWICE DAILY, Disp: 60 tablet, Rfl: 5 .  Vitamin D, Ergocalciferol, (DRISDOL) 1.25 MG (50000 UNIT) CAPS capsule, Take 50,000 Units by mouth every 7 (seven) days., Disp: , Rfl:  .  acetaminophen (TYLENOL) 500 MG tablet, Take 1,000 mg by mouth 2 (two) times daily as needed.  (Patient not taking: No sig reported), Disp: , Rfl:  .  albuterol (PROAIR HFA) 108 (90 Base) MCG/ACT inhaler, Inhale 2 puffs into the lungs every 6 (six) hours as needed for wheezing or shortness of breath. (Patient not taking: No sig reported), Disp: 1 Inhaler, Rfl: 2 .  albuterol (PROAIR HFA) 108 (90 Base) MCG/ACT inhaler, Inhale 2 puffs into the lungs every 4 (four) hours as needed for wheezing or shortness of breath. (Patient not taking: No sig reported), Disp: 18 g, Rfl: 1 .  albuterol (PROVENTIL) (2.5 MG/3ML) 0.083% nebulizer solution, Take 3 mLs (2.5 mg total) by nebulization every 4 (four) hours as needed for wheezing or shortness of breath. (Patient not taking: No sig reported), Disp: 75 mL, Rfl: 1 .  AUVI-Q 0.3 MG/0.3ML SOAJ injection, Use as directed for life-threatening allergic reaction. (Patient not taking: No sig reported), Disp: 2 each, Rfl: 2 .  budesonide (PULMICORT) 0.5 MG/2ML nebulizer solution, Take 2 mLs (0.5 mg total) by nebulization as directed. (Patient not taking: No sig reported), Disp: 2 mL, Rfl: 3 .  budesonide-formoterol (SYMBICORT) 160-4.5 MCG/ACT inhaler, Inhale 2 puffs into the lungs 2 (two) times daily. (Patient not taking: No sig reported), Disp: 1 Inhaler, Rfl: 5 .  insulin lispro (HUMALOG KWIKPEN) 100 UNIT/ML KwikPen, Inject up to 50 units per day (Patient not taking: Reported on 12/10/2020), Disp: 15 mL, Rfl: 5 .  levocetirizine (XYZAL) 5 MG tablet, Take 1 tablet (5 mg total) by mouth daily as needed for allergies. (Patient not taking: Reported on 12/10/2020), Disp: 30 tablet, Rfl: 5 .  methocarbamol  (ROBAXIN) 500 MG tablet, Take 1 tablet (500 mg total) by mouth 2 (two) times daily. (Patient not taking: No sig reported), Disp: 60 tablet, Rfl: 1 .  Mometasone Furoate (ASMANEX HFA) 200 MCG/ACT AERO, For asthma flare, begin Asmanex 200-2 puffs twice a day for 1-2 weeks or until cough and wheeze free (Patient not taking: No sig reported), Disp: 13 g, Rfl: 5 .  mupirocin ointment (BACTROBAN) 2 %, mupirocin 2 % topical ointment  APP EXT IEN BID (Patient not taking: No sig reported), Disp: , Rfl:  .  nystatin (MYCOSTATIN) 100000 UNIT/ML suspension, Take by mouth. (Patient not taking: No sig reported), Disp: , Rfl:  .  omeprazole (PRILOSEC) 20 MG capsule, Take 20  mg by mouth daily as needed (for reflux).  (Patient not taking: No sig reported), Disp: , Rfl:   Current Facility-Administered Medications:  [name]  Mepolizumab SOLR 100 mg, 100 mg, Subcutaneous, Q28 days, Valentina Shaggy, MD, 100 mg at 02/16/19 1018  Facility-Administered Medications Ordered in Other Visits:  .  gadopentetate dimeglumine (MAGNEVIST) injection 20 mL, 20 mL, Intravenous, Once PRN, Dohmeier, Asencion Partridge, MD  Allergies as of 12/25/2020 - Review Complete 12/25/2020  Allergen Reaction Noted  . Flonase [fluticasone] Shortness Of Breath 06/10/2017  . Sulfa antibiotics  12/10/2020  . Molds & smuts Other (See Comments) 03/09/2018  . Sulfites Itching and Swelling 02/04/2018  . Tetracyclines & related Other (See Comments) 03/09/2018  . White birch Cough 04/10/2020    1. Work and Family: He can no longer work part-time at home. 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  2. Activities: He walks every day, but is in pain.  3. Smoking, alcohol, or drugs: He has not been drinking any alcohol or beer. No tobacco or drugs. 4. Primary Care Provider: His PCP is Ms Mattie Marlin, PA, [facility].  5. Neurology: Dr. Brett Fairy 6. Ophthalmology:  7. Retinology: Dr. Zadie Rhine 8. Urology: Dr. Jeffie Pollock at  [facility] 9. Sports medicine: Dr. Oneida Alar 10. Therapy: Ms Rea College, RN, MSN  REVIEW OF SYSTEMS: There are no other significant problems involving Karandeep's other body systems.   Objective:  Vital Signs:  BP 112/70   Pulse 92   Wt 212 lb 6.4 oz (96.3 kg)   BMI 28.81 kg/m     Ht Readings from Last 3 Encounters:  09/18/20 6' (1.829 m)  09/11/20 6' (1.829 m)  07/24/20 6' (1.829 m)   Wt Readings from Last 3 Encounters:  12/25/20 212 lb 6.4 oz (96.3 kg)  12/10/20 214 lb 6.4 oz (97.3 kg)  12/05/20 213 lb 6.4 oz (96.8 kg)   BMI 29  PHYSICAL EXAM:  Constitutional: The patient appears overweight, tired, and frustrated. He has lost 1 more pound since his last visit. His affect is fairly subdued and depressed. His insight is good. He engaged fairly well today.   LAB DATA:   Labs 12/25/20: CBG 199   Labs 2/18-2/19/22: CBG 285; CBC normal; U/A >500 glucose, negative ketones; BMP normal, except CO2 20 and glucose 294; CK 77 (ref 49-397); hepatic panel normal  Labs 12/05/20: HbA1c 7.5%, CBG 165; BHOB  0.2 (ref 0.04-0.27); venous pH 7.284;   Labs 08/16/20: HbA1c 6.2%, CBG 215  Labs 05/14/20: HbA1c 6.5%, CBG 176; TSH 2.44, free T4 1.1, free T3 3.5; urinary microalbumin/creatinine ratio 2 (ref <30)  Labs 04/17/20: ESR 6 (ref 0-15), CK 82 (ref 49-439)  Labs 07/12/19: HbA1c 6.4%, CBG 231  Labs 04/26/19: HbA1c 6.8%, CBG 230  Labs 12/30/18: TSH 2.07, free T4 1.1, free T3 3.2, TPO antibody <1, thyroglobulin antibody <1; CMP normal, except glucose 191; cholesterol 165, triglycerides 138, HDL 33, LDL 106; urinary microalbumin/creatinine ratio 5; vitamin B1 10 (ref 8-30), vitamin B6 3.4 (ref 2.1-21.7), vitamin B12 451 (ref [id number])  Labs 11/29/18: CBG 179  Labs 11/29/18: CMP with glucose 198, chloride 109 (ref 96-106, CO2 17 (ref 20-29); CBC with WBC 11.6 (ref 3.4-10.8), PMNs 8.6 (ref 1.4-7.0), and monocytes 1.0 (ref 0.1-0.9)  Labs 10/28/18: HbA1c 6.9%, CBG 155  Labs 10/06/18: ANA  titer negative, ANCA titers negative, rheumatoid factor negative, cyclic citrullin peptide 7 (ref 0-190  Labs 07/27/18: CBG 193  Labs 05/31/18: HbA1c 6.6%, CBG  202  Labs 03/18/18: CBG 178  Labs 01/13/18: HbA1c 6.8%, CBG 194; TSH 0.98, free T4 1.3; free T3 3.6; CMP normal except glucose 191; urinary microalbumin/creatinine ratio 35 (ref <30)  Labs 11/17/17: CBG 170  Labs 10/13/17: HbA1c 6.8%, CBG 191  Labs 08/24/17: CBG 117  Labs 07/20/17: CBG 216  Labs 07/08/17: IgA, IgG, and IgM normal. CBC normal.  Labs 06/16/17: HbA1c 6.5%, CBG 193  Labs 05/15/17: CBG 209; TSH 0.90, free T4 1.2, free T3 3.8  Labs 03/05/17: HbA1c 7.3%, CBG 241  Labs 02/20/17: CBG 180 fasting  Labs 01/29/17: CBG 148  Labs 01/12/17: CBG 116  Labs 12/29/16: HbA1c 9.1%, CBG 223; TSH 0.62, free T4 1.1, free T3 3.5; urine microalbumin/creatinine ratio 22; cholesterol 171, triglycerides 93, HDL 40, LDL 112;  CMP glucose 201  Labs 09/26/16: HbA1c 9.5%  Labs 08/13/16: Celiac panel negative; CMP normal except for glucose 207; CBC normal  Labs 06/13/16: HbA1c 9.8%; LH 3.6, FSH 2.3, testosterone 533 (ref [id number]), free testosterone 59.4 (ref 47-244)  Labs 02/11/16: HbA1c 9.2%  Labs 01/07/16: HbA1c 10.5%; CBC with Hgb elevated at 17.2%; LH 3.9, FSH 2.0; TSH 1.53, free T4 1.3, free T3 3.7; cholesterol 262, triglycerides 171, HDL 40, LDL 188; urinary microalbumin/creatinine ratio 31; CMP normal except for glucose 181  Labs 06/29/15. HbA1c 9.6%  Labs 12/28/14: Hemoglobin A1c 9.5% today, compared with 9.5%, at last visit and with 9.9% at the prior visit.  Labs 08/03/14: HbA1c 9.5%; TSH 0.548,free T4 1.10, free T3 3.7, TPO antibody < 1, TSI 30; CMP normal except glucose 191; urinary microalbumin/creatinine ratio 27; cholesterol 260, triglycerides 90, HDL 51, LDL 191   Labs 10/05/13: HbA1c 9.9%  Labs 04/23/13: CMP normal, except glucose 144; cholesterol 216, triglycerides 97, HDL 39, LDL 158; urinary microalbumin/creatinine ratio  6.9; TSH 0.505, free T4 1.17, free T3 3.8  Labs: 01/21/12: TSH 1.251, free T4 1.10, free T3 3.6, CBC normal, CMP normal except for glucose 206, urinary microalbumin/creatinine ratio 6.3, cholesterol 187, triglycerides 67, HDL 45, LDL 129   Assessment and Plan:   ASSESSMENT:  1. T1DM:   A. His SGs had been mostly <200, reflecting fairly good DM control, but at the cost of severely curtailing his food intake.   B. In early February 2022, his HbA1c has increased to 7.5%%, compared to 6.2% in October 2021. He was also not having too many low BGs in October   B. [name] had done very well at trying to optimize his BG control. This level of BG control without significant hypoglycemia in October 2021 was a testament to the power of using auto mode in insulin pump-CGM technology and to [name] determination to keep his BGs under control.     C. Unfortunately, in the past four months he has had many site problems and sensor problems. BGs and HbA1c are much higher. 2. Hypoglycemia: He has not had many SGs <70. 3. Hypertension: His BP is normal today.  4. Combined hyperlipidemia:   A. His lipids in March 2017 were too high, but the lipids in March 2018 were much better on atorvastatin. He discontinued the atorvastatin in mid-2018 due to concern that this medication might be causing his neuromuscular symptoms. His lipids in March 2020 were better.   B. Ms Frederico Hamman re-started his atorvastatin several months ago. We need to obtain a fasting lipid panel.  5. Autonomic neuropathy with tachycardia and gastroparesis: His gastroparesis has improved in parallel with his BG improvement. His heart rate has improved as well. [name]  6. Obesity: Weight has decreased. He is eating much less. He has also probably lost some muscle.    7-8. Goiter/thyroiditis:   A. His thyroid gland had shrunk back to normal size at a previous visit, but had increased slightly at his last visit and remains slightly enlarged today. His thyroiditis is  clinically quiescent.   B. The active thyroiditis that he has had in the past and the  process of waxing and waning of thyroid gland size are c/w evolving Hashimoto's thyroiditis.   C. He was euthyroid in March 2013, but was borderline hyperthyroid in June 2014. He was euthyroid in October 2015, March 2017, March 2018, July 2018, in March 2019, in March 2020, and in July 2021.  9. Fatigue: This problem is much worse today..   10. Obstructive sleep apnea: He did not meet the criteria for OSA, but because of his snoring a dental appliance was suggested. I recommended that he obtain a dental appliance. Unfortunately, his former dentist no longer accepts [employer], so [name] has to find a new dentist. 58. Adult ADD: He had an appointment with Dr. Carlena Hurl, MD in [place] in November 2018. Dr. Wylene Simmer is waiting for [name] medical situation to stabilize before beginning any new psych meds.   12. Proliferative diabetic retinopathy: As above.  13. URI/nasal congestion/vertigo/allergic rhinitis and conjunctivitis: He is not having these problems today. The monthly injections have really helped him.   14. Neuromuscular symptoms and headaches/increased intracranial pressure:   A. It appeared previously that some of [name] headaches were classic tension headaches. When he performed cervical stretching exercises the headache resolved.   B. In the past when Dr. Brett Fairy evaluated [name] for his vague neuromuscular symptoms, she did not find any signs of any serious neurologic disease. His MRI of his brain was unremarkable. At that time his neuromuscular symptoms had resolved.  C. Later, however, the headaches worsened and some of the neuromuscular symptoms recurred. We now know that he had increased ICP and that his headaches improved for a brief time after the LP. He then developed a typical spinal tap headache.  D. In retrospect he may have had intermittent problems with elevated ICP  previously.  E. Dr. Brett Fairy suspects that [name] ICP was due to the doxycycline. She will follow this issue over time.   F. He appeared to have problems taking his prior dose of Diamox, but is doing better on the combination of a lower dose of Diamox and Topamax.   G. He is being evaluated at [facility] neurology now.  15. Calcified lymph node: It is possible that he may have contracted a fungus while he was working in the [employer]. several years ago. His thyroid US showed his goiter, but no intrinsic thyroid abnormality. A partially calcified cervical lymph node on the right was noted, c/w a post infections/inflammatory node. 16. Microalbuminuria: His ratio was mildly elevated in March 2019. He needed tighter BG and BP control. His ratio in March 2020 and again in July 2021 was very good.  17. Anhedonia: [name] is depressed today. He feels better, but wants to be normal. His sessions with Ms Rea College, RN, MSN, a psychiatric nurse specialist, have been very beneficial. He still needs to find more sources of fun.  18. Neck pain/Cervical neuritis/neuromuscular pain: [name] has seen Dr. Oneida Alar several times. Dr. Oneida Alar had planned to obtain a neck xray if [name] neck did not improve. Unfortunately, [name] missed his lat appointment with Dr. Oneida Alar due  to conflicting medical appointment.  I asked [name] to re-schedule his appointment with Dr. Oneida Alar.   19. Cerebral arteriosclerosis: It is unclear to me whether the lesions seen on angiography and MRA are amenable to vascular surgical techniques. I put in a referral today to vascular surgery for a consultation.   PLAN:  1. Diagnostic:  Repeat lipid panel in future. He will have a problem-solving session with Dr. Drexel Iha, PharmD tomorrow morning at 11 AM. 2. Therapeutic: Continue current pump settings. Continue losartan, ASA, and atorvastatin.   Basal rates: basal 1 MN:1.20 6 AM: 1.90 2 PM: 1.60 8 PM: 1.65   ISFs: 35   ICRs: MN: 7 8 AM: 4.5 11  AM: 4.5 9 PM: 7   BG targets:  MN: 130-150 6 AM: 120 10 PM 130-150  3. Patient education:   A. We discussed all of the above at great length. We also discussed the need for him to continue to be followed closely by Ms. Frederico Hamman and Drs. Kozlow, Dohmeier, Rankin, Cira Servant, Ms [place], and Dr. Wylene Simmer when he can again do so. .   B.  I contacted [facility], at [phone], and obtained a one-year pre-authorization  approval for him to have 300 test strips per month. The pre-auth number is [id number]. The clerical person was Tonia Ghent. 4. Follow-up:  2 months with me, more frequently with Dr. Lovena Le   Level of Service: This visit lasted in excess of 120 minutes. More than 50% of the visit was devoted to researching [name] record, coordinating his care, problem solving with him, and documenting this consultation.   Tillman Sers, MD, CDE Adult and Pediatric Endocrinology

## 2020-12-25 ENCOUNTER — Other Ambulatory Visit: Payer: Self-pay

## 2020-12-25 ENCOUNTER — Encounter (INDEPENDENT_AMBULATORY_CARE_PROVIDER_SITE_OTHER): Payer: Self-pay | Admitting: "Endocrinology

## 2020-12-25 ENCOUNTER — Ambulatory Visit (INDEPENDENT_AMBULATORY_CARE_PROVIDER_SITE_OTHER): Payer: 59 | Admitting: "Endocrinology

## 2020-12-25 VITALS — BP 112/70 | HR 92 | Wt 212.4 lb

## 2020-12-25 DIAGNOSIS — E1065 Type 1 diabetes mellitus with hyperglycemia: Secondary | ICD-10-CM

## 2020-12-25 LAB — POCT GLUCOSE (DEVICE FOR HOME USE): Glucose Fasting, POC: 199 mg/dL — AB (ref 70–99)

## 2020-12-25 NOTE — Patient Instructions (Signed)
Follow up visit with me in 2 months. Follow up visit with dr. Ladona Ridgel tomorrow at 11 AM.

## 2020-12-26 ENCOUNTER — Telehealth (INDEPENDENT_AMBULATORY_CARE_PROVIDER_SITE_OTHER): Payer: Self-pay | Admitting: Pharmacist

## 2020-12-26 ENCOUNTER — Ambulatory Visit (INDEPENDENT_AMBULATORY_CARE_PROVIDER_SITE_OTHER): Payer: 59 | Admitting: Pharmacist

## 2020-12-26 VITALS — Wt 212.0 lb

## 2020-12-26 DIAGNOSIS — E1065 Type 1 diabetes mellitus with hyperglycemia: Secondary | ICD-10-CM

## 2020-12-26 LAB — POCT GLUCOSE (DEVICE FOR HOME USE): POC Glucose: 153 mg/dl — AB (ref 70–99)

## 2020-12-26 MED ORDER — HUMALOG JUNIOR KWIKPEN 100 UNIT/ML ~~LOC~~ SOPN: PEN_INJECTOR | SUBCUTANEOUS | 11 refills | Status: DC

## 2020-12-26 NOTE — Telephone Encounter (Signed)
Spoke with Medtronic rep Kathi Simpers [facility] regarding patient. She will sent [name] Minimed Advance 6 mm infusion set sites.  In regards to processing return of pump via insurance she recommended [name] call [phone] to discuss this with billing representative to see where insurance adjudication is currently at at processing return.  Please contact patient and provide status update, thank you!  Thank you for involving clinical pharmacist/diabetes educator to assist in providing this patient's care.   Zachery Conch, PharmD, CPP, CDCES

## 2020-12-26 NOTE — Progress Notes (Signed)
S:     Chief Complaint  Patient presents with  . Diabetes    Education     Endocrinology provider: Dr. Fransico Michael (upcoming appt 02/27/21 9:45 am)  Patient referred to me by Dr. Fransico Michael for medtronic MiniMed 770G insulin pump training. 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 Patient recently saw Dr. Fransico Michael on 12/05/20 - he has been having issues with his pump sites and CGM sensors. Patient has been on a Medtronic pump since initial dx of T1DM (~2000s)  Patient presents today for follow up appt. He has had multiple issues with wearing new insulin pump sites. He has tried the following insulin pump sites: mio advance, mio, mio quick set, and silhouette. He has been using his abdomen and lower back to apply sites. Patient states he put in a Sure T site near his back --> ate 4 chicken nuggets 20-30 minutes after site change (BG was ~140 mg/dL, bolused ~2 units of insulin for food and BG) --> sugars started increasing 30-40 min later (> 200 mg/dL) so patient did a correction bolus (1 unit) --> continued to increase (> 250 mg/dL) about 30 minutes later so bolused about 1 unit more. He stated it slowed down the rise. BG stabilized. Then 1 hour later BG still went up to ~270-280 gave more insulin (unsure how much). Went upstairs to walk on the treadmill to help site start working (reports did not help lower BG readings). He gave 0.5 unit of insulin. After about 5 hours he switched infusion sites to a prior site on his stomach that had worked "okay" previously. He has been using top of his buttocks/lower back and stomach for infusion sites. He wears his Dexcom on his arms. He has not tried using his legs yet. He reports he will  give the pump site about 5 hours to determine if it is working or not and if not he will use another pump site that worked well (typically his stomach).  Insurance Chiropodist  Basal rates MN:1.20 6 AM: 1.90 2 PM: 1.60 8 PM: 1.65  Carb Ratio MN: 7 8 AM: 4.5 11 AM: 4.5 9 PM: 7   Correction Factor Ratio MN 35   Target BG MN: 130-150 6 AM: 120 10 PM 130-150  Infusion Set: Siloutte Paradigm  -Cannula site: 17 mm -Tubing length: 23 in  O:   Labs:   Dexcom G6 CGM Report      Carelink Report    There were no vitals filed for this visit.  Lab Results  Component Value Date   HGBA1C 7.5 (A) 12/05/2020   HGBA1C 6.2 (A) 08/16/2020   HGBA1C 6.5 (A) 05/14/2020    No results found for: CPEPTIDE     Component Value Date/Time   CHOL 165 12/30/2018 1146   TRIG 138 12/30/2018 1146   HDL 33 (L) 12/30/2018 1146   CHOLHDL 5.0 (H) 12/30/2018 1146   VLDL 19 12/29/2016 1146   LDLCALC 106 (H) 12/30/2018 1146    Lab Results  Component Value Date   MICRALBCREAT 2 05/14/2020    Assessment: Patient is extremely fearful of hyperglycemia. I think this is due to significant issues with Medtronic pump and prior Guardian sensor. When his BG increases to > 250 (per patient report) he states he will attempt to bolus (per pump  report he does NOT enter correction bolus, but rather will enter a random amount of ghost carbs (this is much less insulin he would get than if he does a proper correction dose) and will wait for it to come down then will try another site area. Site areas he is using is abdomen (against my prior advice considering significant lipohypertrophy) and lower back.   Considering BG is tightly controlled overnight and for most of the day, it is hard to determine site issues as if patient truly had a bad site his BG readings would be > 400 if his site was truly not working. Patient does counter this by being adamant about changing pump sites so  frequently that it would be very challenging to tell if his pump site was bad. He has not been eating to prevent his BG from increasing (explained this also supports it is not a bad pump site because if it was a bad pump site he would not be receiving basal insulin and BG would be high). I think patient is extremely anxious considering prior experience. It is possible that rather his pump settings may need to be changed, particularly between 12-6. He has not been bolusing appropriately - entering ghost carbs rather than correction doses so it makes sense his BG readings have not been appropriately decreasing. Strongly advised him to enter his BG when he notices his BG is elevated to receive appropriate correction dose. Also strongly advised him to start eating food again so we can be able to determine if his pump settings are appropriate.   We tried applying 3 medtronic mio advance infusion sets (9 mm) to his legs, but each time he felt he could feel the cannula "in his tendon" or "was just uncomfortable". He thinks 9 mm is too long and would prefer 6 mm. Patient only had 6 mm Minimed Sure-T sites. We tried a Sure-T site and it was successful. Will reach out to Kathi Simpers (Medtronic rep) to order more 6 mm sure-T sites for patient to use in interim time frame until Medtronic processes return of his Minimed 770G pump. Patient also complains of significant time it has been taking to process return of pump - will check in with Kathi Simpers about this. Provided patient with Humalog junior kwikpen samples to use (instructed patient on bad pump site instructions) and Humalog vial (considering amount of site changes)   Samples of this drug were given to the patient for Humalog junior kwikpens  (quantity 1, Lot Number [id number] [facility], expiration date 10/26/21) and for Humalog vial (quantity 1, Lot Number [id number], expiration date 11/26/2020)    Plan: 1. Pump Sites a. Will try Minimed Sure-T site and will enter in BG rather  than ghost carbs (may need dose change) b. Instructed patient on bad pump site instructions (provided Humalog junior kwikpen to use in case) i. Wrote prescription for Humalog [employer] junior 2. Monitoring:  a. Continue wearing Dexcom G6 CGM b. Adonus Uselman has a diagnosis of diabetes, checks blood glucose readings > 4x per day, treats with > 3 insulin injections or wears an insulin pump, and requires frequent adjustments to insulin regimen. This patient will be seen every six months, minimally, to assess adherence to their CGM regimen and diabetes treatment plan. 3. Follow Up: 12/28/20 11:30 am  Written patient instructions provided.    This appointment required 120 minutes of patient care (this includes precharting, chart review, review of results, face-to-face care, etc.).  Thank you for involving clinical  pharmacist/diabetes educator to assist in providing this patient's care.  Zachery Conch, PharmD, CPP, CDCES

## 2020-12-27 ENCOUNTER — Encounter (INDEPENDENT_AMBULATORY_CARE_PROVIDER_SITE_OTHER): Payer: Self-pay

## 2020-12-27 NOTE — Telephone Encounter (Signed)
Patient sent mychart message with update, responded to mychart message and included this message to patient.

## 2020-12-28 ENCOUNTER — Other Ambulatory Visit: Payer: Self-pay

## 2020-12-28 ENCOUNTER — Ambulatory Visit (INDEPENDENT_AMBULATORY_CARE_PROVIDER_SITE_OTHER): Payer: 59 | Admitting: Pharmacist

## 2020-12-28 VITALS — Wt 210.4 lb

## 2020-12-28 DIAGNOSIS — E1065 Type 1 diabetes mellitus with hyperglycemia: Secondary | ICD-10-CM | POA: Diagnosis not present

## 2020-12-28 LAB — POCT GLUCOSE (DEVICE FOR HOME USE): Glucose Fasting, POC: 127 mg/dL — AB (ref 70–99)

## 2020-12-28 MED ORDER — GLUCOSE BLOOD VI STRP: ORAL_STRIP | 11 refills | Status: DC

## 2020-12-28 NOTE — Progress Notes (Signed)
S:     Chief Complaint  Patient presents with  . Diabetes    Education    Endocrinology provider: Dr. Fransico Michael (upcoming appt 02/27/21 9:45 am)  Patient referred to me by Dr. Fransico Michael for medtronic MiniMed 770G insulin pump training. 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 Patient recently saw Dr. Fransico Michael on 12/05/20 - he has been having issues with his pump sites and CGM sensors. Patient has been on a Medtronic pump since initial dx of T1DM (~2000s)  Patient presents today for follow up appt. He has been wearing Tru Steel 6 mm infusion set since Wednesday 12/26/20 - he has not changed this site and feels it is working well. He reports he has ate a hamburger and a chicken sandwich the last few days. He called Medtr[employer] and they have sent him Advance Mio 6 mm infusion sites to use in the interim time period until he can get on Tandem pump. He has brought Advance Mio sites to appt today. [employer] also informed him that his pump has been processed via insurance and successfully reimbursed. He is still waiting on [employer] to process reimbursement with Guardian sensor. He called [employer] and the guy who was handling his account was busy and told him they would call him back in the next few days.  Insurance Chiropodist  Basal rates MN:1.20 6 AM: 1.90 2 PM: 1.60 8 PM: 1.65  Carb Ratio MN: 7 8 AM: 4.5 11 AM: 4.5 9 PM: 7   Correction Factor Ratio MN 35   Target BG MN: 130-150 6 AM: 120 10 PM 130-150  Infusion Set: Tru Steel  -Cannula Length: 6 mm  O:   Labs:   Dexcom G6 CGM Report      Carelink Report    There were no vitals filed for this visit.  Lab Results   Component Value Date   HGBA1C 7.5 (A) 12/05/2020   HGBA1C 6.2 (A) 08/16/2020   HGBA1C 6.5 (A) 05/14/2020    No results found for: CPEPTIDE     Component Value Date/Time   CHOL 165 12/30/2018 1146   TRIG 138 12/30/2018 1146   HDL 33 (L) 12/30/2018 1146   CHOLHDL 5.0 (H) 12/30/2018 1146   VLDL 19 12/29/2016 1146   LDLCALC 106 (H) 12/30/2018 1146    Lab Results  Component Value Date   MICRALBCREAT 2 05/14/2020    Assessment: Pump Education - Patient has brought Mio Advance infusion sites (18mm) to appointment. We did site change together - patient demonstrated appropriate technique. He tried the side of his leg (was using back of his leg previously) and feels that the 35mm site is [facility] better. Continue Mio Advance infusion site. Discussed management of bad site again at this visit. He is aware of what to do (bolus via pump, if BG does not start decreasing within 1 hour then take off pump site and administer correction bolus via pen then put on new site, repeat as necessary). He will contact me if he has additional pump site issues.   Pump adjustment - Patient is not entering total carbs (was only entering 14 g) for meals (hamburger, chicken sandiwch). Advised him to always double check appropriate amount of carbs. The more accurate the carb counting is the best way to ensure pump settings are appropriate.  Patient verbalized understanding.  Tandem pump - Patient is awaiting response from [employer]. Will contact Christopher Potocnik (tandem rep) to see where we are in process of attaining pump for patient. Will notify patient once I receive a response. Once patient obtains Tandem pump advised him to contact me for pump training appt. Patient verbalized understanding.  Plan 1. Pump Education a. Continue Mio Advance infusion site. 2. Pump adjustment a. Make sure to enter accurate amount of carbs for food dose to determine if pump adjustment should be made 3. Tandem pump a. Will contact  Christopher Potocnik (tandem rep) to see where we are in process of attaining pump for patient then contact patient with update 4. Monitoring:  a. Continue wearing Dexcom G6 CGM b. Jerrett Baldinger has a diagnosis of diabetes, checks blood glucose readings > 4x per day, treats with > 3 insulin injections or wears an insulin pump, and requires frequent adjustments to insulin regimen. This patient will be seen every six months, minimally, to assess adherence to their CGM regimen and diabetes treatment plan. 5. Follow Up: prn  Written patient instructions provided.    This appointment required 60 minutes of patient care (this includes precharting, chart review, review of results, face-to-face care, etc.).  Thank you for involving clinical pharmacist/diabetes educator to assist in providing this patient's care.  Zachery Conch, PharmD, CPP, CDCES

## 2020-12-28 NOTE — Patient Instructions (Addendum)
It was a pleasure seeing you today!  Today the plan is.. 1. Keep up the great work! 2. Continue using Advance mio sites 3. Call me if you have any more site issues and/or you receive your Tandem pump (2 hour education appointment with myself   Please contact me (Dr. Ladona Ridgel) at [phone] or via Mychart with any questions/concerns

## 2020-12-29 ENCOUNTER — Encounter (INDEPENDENT_AMBULATORY_CARE_PROVIDER_SITE_OTHER): Payer: Self-pay

## 2021-01-01 ENCOUNTER — Other Ambulatory Visit: Payer: Self-pay

## 2021-01-01 ENCOUNTER — Encounter (INDEPENDENT_AMBULATORY_CARE_PROVIDER_SITE_OTHER): Payer: Self-pay

## 2021-01-01 ENCOUNTER — Ambulatory Visit (INDEPENDENT_AMBULATORY_CARE_PROVIDER_SITE_OTHER): Payer: 59 | Admitting: Pharmacist

## 2021-01-01 VITALS — Wt 210.6 lb

## 2021-01-01 DIAGNOSIS — E1065 Type 1 diabetes mellitus with hyperglycemia: Secondary | ICD-10-CM | POA: Diagnosis not present

## 2021-01-01 LAB — POCT GLUCOSE (DEVICE FOR HOME USE): POC Glucose: 226 mg/dl — AB (ref 70–99)

## 2021-01-01 NOTE — Progress Notes (Signed)
S:     Chief Complaint  Patient presents with  . Diabetes    Pump Education    Endocrinology provider: Dr. Fransico Michael (upcoming appt 02/27/21 9:45 am)  Patient referred to me by Dr. Fransico Michael for medtronic MiniMed 770G insulin pump training. 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 Patient recently saw Dr. Fransico Michael on 12/05/20 - he has been having issues with his pump sites and CGM sensors. Patient has been on a Medtronic pump since initial dx of T1DM (~2000s)  Patient presents today for follow up appt. He reports he had success with Advance Mio site until Sat 12/29/20. He saw BG increasing to > 200 mg/dL and administered multiple correction boluses / ghost carbs to lower BG. It was taking significant amount of time (greater than a few hours) to lower BG to <200 mg/dL so he changed to Sure T pump site. He feels Sure T pump site has been more successful. He has not changed Sure T pump site since Sat 12/29/20. Patient reports bolusing AFTER he eats. He received a notification that [employer] is working through insurance and it will be 7-10 days to determine further information about insurance coverage.   Insurance Chiropodist  Basal rates MN:1.20 6 AM: 1.90 2 PM: 1.60 8 PM: 1.65  Carb Ratio MN: 7 8 AM: 4.5 11 AM: 4.5 9 PM: 7   Correction Factor Ratio MN 35   Target BG MN: 130-150 6 AM: 120 10 PM 130-150  Infusion Set: Sure T / Mio Advance  -Cannula Length: 6 mm  Diet: Patient reported dietary habits:  Eats 2ish meals/day  Breakfast: skips/wakes up late Lunch: 6 in spicy italian sub on multi grain wheat bread (24 grams of carb), barberitos taco salad (ground [place], lettuce, beans (1/2  cup), salsa), freddys hamburger with half of the bun (total = 32; half bun 16), zaxbys grilled chicken tenders, bojangles grilled chicken sandwich without bread,  Dinner:similar food to lunch   O:   Labs:   Dexcom G6 CGM Report      Carelink Report     There were no vitals filed for this visit.  Lab Results  Component Value Date   HGBA1C 7.5 (A) 12/05/2020   HGBA1C 6.2 (A) 08/16/2020   HGBA1C 6.5 (A) 05/14/2020    No results found for: CPEPTIDE     Component Value Date/Time   CHOL 165 12/30/2018 1146   TRIG 138 12/30/2018 1146   HDL 33 (L) 12/30/2018 1146   CHOLHDL 5.0 (H) 12/30/2018 1146   VLDL 19 12/29/2016 1146   LDLCALC 106 (H) 12/30/2018 1146    Lab Results  Component Value Date   MICRALBCREAT 2 05/14/2020    Assessment:  Pump education / adjustment - Patient is entering more carbs at a time, but is still under counting carbs. Advised [name] if he is eating fast food he must enter in minimum of 20 grams of carb (could likely need up to 30 grams) depending on meal. Patient is receiving significant amount of insulin per pump settings - I am hesitant to change any pump settings at this time. Patient changes pump once BG starts heading > 200 mg/dL after meals. I attempted to explain if he had a bad pump site BG would be 300 - 400 mg/dL. In my effort to validate  patient's concerns of a bad pump site and balance my understanding of current situation I reached out to my colleague Gretchen Short, NP, for his expertise to ensure I was not missing/overlooking any pertinent information. Spenser was able to provide further guidance about bolusing BEFORE meals for better BG control. Spenser agreed that patient does not appear to be experiencing bad pump site. He also educated patient about eating healthier meals to prevent significant spike in BG readings. I sincerely appreciate Spenser's additional time and guidance in these recommendations. Spenser spent ~30 minutes reviewing  pump report, Dexcom Clarity report, and discussing these recommendations in appointment with myself and patient. Patient verbalized understanding. He is agreeable to leaving on pump site unless BG > 300 mg/dL for 2-3 hours. He is also agreeable to bolusing before meals and attempting to eat healthier. Will continue to follow and provide assistance as necessary.  [employer] pump - Patient remains awaiting response from [employer]. Contacted Christopher Potocnik ([employer] rep) who confirmed information patient relayed to me previously. Will await insurance approval at this time. Once patient receives pump he will contact me to schedule appt.  Plan 1. Pump Education a. Continue Sure T and Mio Advance infusion sits  2. Pump adjustment a. Make sure to enter accurate amount of carbs for food dose BEFORE EATING  3. [employer] pump a. Will await insurance approval at this time b. Once patient receives pump he will schedule pump training appt with myself 4. Monitoring:  a. Continue wearing Dexcom G6 CGM b. Maxi Rodas has a diagnosis of diabetes, checks blood glucose readings > 4x per day, treats with > 3 insulin injections or wears an insulin pump, and requires frequent adjustments to insulin regimen. This patient will be seen every six months, minimally, to assess adherence to their CGM regimen and diabetes treatment plan. 5. Follow Up: prn  Written patient instructions provided.    This appointment required 60 minutes of patient care (this includes precharting, chart review, review of results, face-to-face care, etc.).  Thank you for involving clinical pharmacist/diabetes educator to assist in providing this patient's care.  Zachery Conch, PharmD, CPP, CDCES

## 2021-01-02 ENCOUNTER — Ambulatory Visit: Payer: 59 | Admitting: Family Medicine

## 2021-01-02 VITALS — BP 100/62 | Ht 72.0 in | Wt 210.0 lb

## 2021-01-02 DIAGNOSIS — M791 Myalgia, unspecified site: Secondary | ICD-10-CM

## 2021-01-02 DIAGNOSIS — E441 Mild protein-calorie malnutrition: Secondary | ICD-10-CM

## 2021-01-02 NOTE — Patient Instructions (Addendum)
I'm concerned your body has been relying on protein more than it should the past couple months, leading to muscle breakdown, atrophy, and weakness. We will refer you as quickly as we can to a dietitian - this will be separate from you seeing the diabetes educator. It is important that the pump site thing is addressed and I'm glad the past several days that has been working. Exercise, physical therapy are not going to be helpful when we just need to get calories and protein in your system first and halt the wasting. I would like to see you in 2-3 weeks if possible.  If you have a way to weigh yourself every other day or every 3rd day and write it down, that would be great.  Things to discuss with neurology: 1. Has your diabetes affected your stomach? (gastroparesis) - if so are you a candidate for a medicine that helps move food through your stomach so you can absorb it - possible they tell you GI doctors would have to evaluate this. 2. Do you need any labs?  B12 is one you haven't had checked yet that can cause symptoms in feet and hands.   *Please call Wyona Almas at [phone] to get scheduled to discuss your nutrition.

## 2021-01-03 ENCOUNTER — Encounter: Payer: Self-pay | Admitting: Family Medicine

## 2021-01-03 NOTE — Progress Notes (Signed)
PCP: [name], PA-C  Subjective:   HPI: Patient is a 40 y.o. male here for hand and foot pain.  Patient reports over the past 2 months he has felt increasingly weak. Primary complaint today is feeling like his feet and hands are 'down to the bone' and it feels uncomfortable when holding the steering wheel, walking. Feels at times like he cannot hold his body weight. Of note he's had difficulty with his insulin pump in getting sites to work (better past 5 days). Relatedly he's lost about 10+ pounds over the past 1-2 months. Reports large caloric deficit taking in 800-900 calories a day over past couple months. Denies numbness or tingling in feet or hands. Microfilament testing has been normal per endo notes. He is also scheduled to see neurologist tomorrow. Has regular appointments with diabetes educator but not dietitian. Denies nausea or vomiting though appetite is not what it has been. Autonomic dysfunction noted in chart - asked him about gastroparesis and he reports history of this being mentioned remotely.  Past Medical History:  Diagnosis Date  . ADHD (attention deficit hyperactivity disorder)   . Asthma   . Autonomic neuropathy due to diabetes (HCC)   . Diabetes (HCC)   . Fatigue   . Goiter   . High blood pressure   . High cholesterol   . Hypercholesterolemia   . Hypertension   . Hypoglycemia associated with diabetes (HCC)   . Obesity   . Tachycardia   . Type 1 diabetes mellitus not at goal [facility])   . Uncontrolled DM with microalbuminuria or microproteinuria     Current Outpatient Medications on File Prior to Visit  Medication Sig Dispense Refill  . acetaminophen (TYLENOL) 500 MG tablet Take 1,000 mg by mouth 2 (two) times daily as needed.  (Patient not taking: No sig reported)    . acetaZOLAMIDE (DIAMOX) 500 MG capsule TAKE 2 CAPSULES BY MOUTH EVERY MORNING AND ONE IN THE EVENING 90 capsule 1  . albuterol (PROAIR HFA) 108 (90 Base) MCG/ACT inhaler Inhale 2  puffs into the lungs every 6 (six) hours as needed for wheezing or shortness of breath. (Patient not taking: No sig reported) 1 Inhaler 2  . albuterol (PROAIR HFA) 108 (90 Base) MCG/ACT inhaler Inhale 2 puffs into the lungs every 4 (four) hours as needed for wheezing or shortness of breath. (Patient not taking: No sig reported) 18 g 1  . albuterol (PROVENTIL) (2.5 MG/3ML) 0.083% nebulizer solution Take 3 mLs (2.5 mg total) by nebulization every 4 (four) hours as needed for wheezing or shortness of breath. (Patient not taking: No sig reported) 75 mL 1  . ALPRAZolam (XANAX) 0.25 MG tablet Take by mouth.    [name] atorvastatin (LIPITOR) 20 MG tablet Take 20 mg by mouth daily.   0  . AUVI-Q 0.3 MG/0.3ML SOAJ injection Use as directed for life-threatening allergic reaction. (Patient not taking: No sig reported) 2 each 2  . budesonide (PULMICORT) 0.5 MG/2ML nebulizer solution Take 2 mLs (0.5 mg total) by nebulization as directed. (Patient not taking: No sig reported) 2 mL 3  . budesonide-formoterol (SYMBICORT) 160-4.5 MCG/ACT inhaler Inhale 2 puffs into the lungs 2 (two) times daily. (Patient not taking: No sig reported) 1 Inhaler 5  . calcium carbonate (OSCAL) 1500 (600 Ca) MG TABS tablet Take by mouth 2 (two) times daily with a meal.    . Continuous Blood Gluc Receiver (DEXCOM G6 RECEIVER) DEVI 1 Device by Does not apply route as directed. (Patient not taking:  Reported on 12/28/2020) 1 each 2  . Continuous Blood Gluc Sensor (DEXCOM G6 SENSOR) MISC Inject 1 applicator into the skin as directed. (change sensor every 10 days) 3 each 11  . Continuous Blood Gluc Transmit (DEXCOM G6 TRANSMITTER) MISC Inject 1 Device into the skin as directed. (re-use up to 8x with each new sensor) 1 each 3  . Glucagon (BAQSIMI TWO PACK) 3 MG/DOSE POWD Place 1 spray into the nose as directed. 2 each 3  . glucose blood test strip Use to check blood sugar up to 10x daily 300 each 11  . glucose blood test strip Use as instructed up to 6x  daily (preferred brand: OneTouch) 180 each 11  . HUMALOG 100 UNIT/ML injection INJECT 300 UNITS UNDER THE SKIN VIA INSULIN PUMP EVERY 48 TO 72 HOURS PER HYPERGLYCEMIA AND DKA PROTOCOLS 120 mL 5  . insulin lispro (HUMALOG KWIKPEN) 100 UNIT/ML KwikPen Inject up to 50 units per day (Patient not taking: No sig reported) 15 mL 5  . insulin lispro (INSULIN LISPRO) 100 UNIT/ML KwikPen Junior Inject up to 50 units daily per provider instructions (Patient not taking: Reported on 12/28/2020) 15 mL 11  . Insulin Pen Needle (PEN NEEDLES) 32G X 4 MM MISC Use to inject insulin 6x per day 200 each 5  . levocetirizine (XYZAL) 5 MG tablet Take 1 tablet (5 mg total) by mouth daily as needed for allergies. (Patient not taking: No sig reported) 30 tablet 5  . losartan (COZAAR) 50 MG tablet TAKE 1 TABLET(50 MG) BY MOUTH DAILY 90 tablet 1  . methocarbamol (ROBAXIN) 500 MG tablet Take 1 tablet (500 mg total) by mouth 2 (two) times daily. (Patient not taking: No sig reported) 60 tablet 1  . Mometasone Furoate (ASMANEX HFA) 200 MCG/ACT AERO For asthma flare, begin Asmanex 200-2 puffs twice a day for 1-2 weeks or until cough and wheeze free (Patient not taking: No sig reported) 13 g 5  . montelukast (SINGULAIR) 10 MG tablet TAKE 1 TABLET(10 MG) BY MOUTH AT BEDTIME 30 tablet 5  . Multiple Vitamin (MULTIVITAMIN WITH MINERALS) TABS tablet Take 1 tablet by mouth daily.    . mupirocin ointment (BACTROBAN) 2 % mupirocin 2 % topical ointment  APP EXT IEN BID (Patient not taking: No sig reported)    . NUCALA 100 MG/ML SOAJ INJECT 100MG  SUBCUTANEOUSLY EVERY 30 DAYS (GIVEN AT MD  OFFICE) 1 mL 11  . nystatin (MYCOSTATIN) 100000 UNIT/ML suspension Take by mouth. (Patient not taking: No sig reported)    . omeprazole (PRILOSEC) 20 MG capsule Take 20 mg by mouth daily as needed (for reflux).  (Patient not taking: No sig reported)    . topiramate (TOPAMAX) 50 MG tablet TAKE 1 TABLET(50 MG) BY MOUTH TWICE DAILY 60 tablet 5  . Vitamin D,  Ergocalciferol, (DRISDOL) 1.25 MG (50000 UNIT) CAPS capsule Take 50,000 Units by mouth every 7 (seven) days.     Current Facility-Administered Medications on File Prior to Visit  Medication Dose Route Frequency Provider Last Rate Last Admin  . gadopentetate dimeglumine (MAGNEVIST) injection 20 mL  20 mL Intravenous Once PRN Dohmeier, , MD      . Mepolizumab SOLR 100 mg  100 mg Subcutaneous Q28 days Porfirio Mylar, MD   100 mg at 02/16/19 1018    Past Surgical History:  Procedure Laterality Date  . EYE SURGERY Right 05/30/2020   Vitrectomy, Dr. 07/30/2020  . EYE SURGERY  04/2020  . IR 3D INDEPENDENT WKST  07/16/2020  .  IR ANGIO INTRA EXTRACRAN SEL COM CAROTID INNOMINATE BILAT MOD SED  07/16/2020  . IR ANGIO VERTEBRAL SEL SUBCLAVIAN INNOMINATE UNI L MOD SED  07/16/2020  . IR ANGIO VERTEBRAL SEL VERTEBRAL UNI R MOD SED  07/16/2020  . IR US GUIDE VASC ACCESS RIGHT  07/16/2020  . REFRACTIVE SURGERY     x9  . WISDOM TOOTH EXTRACTION      Allergies  Allergen Reactions  . Flonase [Fluticasone] Shortness Of Breath  . Sulfa Antibiotics     Shortness of breath, increased heart rate; "happened quite a while ago"  . Molds & Smuts Other (See Comments)    Aggravate asthma  . Sulfites Itching and Swelling  . Tetracyclines & Related Other (See Comments)    increased intracranial pressure  . [place] Cough    Social History   Socioeconomic History  . Marital status: Single    Spouse name: Not on file  . Number of children: Not on file  . Years of education: Not on file  . Highest education level: Not on file  Occupational History  . Not on file  Tobacco Use  . Smoking status: Never Smoker  . Smokeless tobacco: Never Used  Vaping Use  . Vaping Use: Never used  Substance and Sexual Activity  . Alcohol use: No  . Drug use: No  . Sexual activity: Not on file  Other Topics Concern  . Not on file  Social History Narrative   ** Merged History Encounter **       Social  Determinants of Health   Financial Resource Strain: Not on file  Food Insecurity: Not on file  Transportation Needs: Not on file  Physical Activity: Not on file  Stress: Not on file  Social Connections: Not on file  Intimate Partner Violence: Not on file    Family History  Problem Relation Age of Onset  . Dementia Paternal Aunt   . Schizophrenia Paternal Aunt   . Cancer Maternal Grandmother   . Diabetes Maternal Grandmother        T2 DM  . Cancer Maternal Grandfather   . Heart disease Father   . Thyroid disease Neg Hx     BP 100/62   Ht 6' (1.829 m)   Wt 210 lb (95.3 kg)   BMI 28.48 kg/m   [facility] Adult Exercise 09/11/2020  Frequency of aerobic exercise (# of days/week) 5  Average time in minutes 30  Frequency of strengthening activities (# of days/week) 0    No flowsheet data found.  Review of Systems: See HPI above.     Objective:  Physical Exam:  Gen: NAD, comfortable in exam room, appears tired/fatigued. No muscle rigidity. Overall decreased muscle mass compared to his last visit with me last year.  Upper extremities: No deformity, swelling, bruising. FROM with 5/5 strength grossly at all joints. No tenderness to palpation. Sensation intact to light touch throughout both extremities  Lower extremities: No deformity, swelling, bruising. FROM with 5/5 strength grossly at all joints. No tenderness to palpation. Sensation intact to light touch throughout both extremities  No gait abnormalities.   Assessment & Plan:  1. Hand and foot 'pain' and weakness - Believe his major issue is large caloric deficit leading to breakdown of muscle protein to satisfy needs with resultant wasting, atrophy, weakness.  Over past few days his insulin sites have been working which is very promising in the first step to reverse this.  Will have him see dietitian as well.  Major issue does not seem to be nausea but rather lack of appetite and energy from  decreased glycogen stores/glucose uptake.  Will not refer to GI at this time but asked him to discuss ?gastroparesis history with neurology or PCP.  His 'pain' is not indicative of stocking and glove numbness but combination of decreased uptake and distribution may warrant checking his B12.  Plan to follow weights and f/u in 2-3 weeks.

## 2021-01-05 ENCOUNTER — Emergency Department (HOSPITAL_COMMUNITY)
Admission: EM | Admit: 2021-01-05 | Discharge: 2021-01-06 | Disposition: A | Discharge status: Home / Self Care | Payer: 59 | Attending: Emergency Medicine | Admitting: Emergency Medicine

## 2021-01-05 ENCOUNTER — Emergency Department (HOSPITAL_COMMUNITY): Payer: 59

## 2021-01-05 ENCOUNTER — Other Ambulatory Visit: Payer: Self-pay

## 2021-01-05 DIAGNOSIS — R002 Palpitations: Secondary | ICD-10-CM | POA: Diagnosis not present

## 2021-01-05 DIAGNOSIS — I10 Essential (primary) hypertension: Secondary | ICD-10-CM | POA: Insufficient documentation

## 2021-01-05 DIAGNOSIS — Z79899 Other long term (current) drug therapy: Secondary | ICD-10-CM | POA: Diagnosis not present

## 2021-01-05 DIAGNOSIS — R0789 Other chest pain: Secondary | ICD-10-CM | POA: Diagnosis not present

## 2021-01-05 DIAGNOSIS — Z7951 Long term (current) use of inhaled steroids: Secondary | ICD-10-CM | POA: Diagnosis not present

## 2021-01-05 DIAGNOSIS — J45909 Unspecified asthma, uncomplicated: Secondary | ICD-10-CM | POA: Insufficient documentation

## 2021-01-05 DIAGNOSIS — Z7982 Long term (current) use of aspirin: Secondary | ICD-10-CM | POA: Diagnosis not present

## 2021-01-05 DIAGNOSIS — J111 Influenza due to unidentified influenza virus with other respiratory manifestations: Secondary | ICD-10-CM

## 2021-01-05 DIAGNOSIS — E103591 Type 1 diabetes mellitus with proliferative diabetic retinopathy without macular edema, right eye: Secondary | ICD-10-CM | POA: Diagnosis not present

## 2021-01-05 DIAGNOSIS — R0682 Tachypnea, not elsewhere classified: Secondary | ICD-10-CM | POA: Insufficient documentation

## 2021-01-05 DIAGNOSIS — Z794 Long term (current) use of insulin: Secondary | ICD-10-CM | POA: Insufficient documentation

## 2021-01-05 DIAGNOSIS — R000 Tachycardia, unspecified: Secondary | ICD-10-CM | POA: Diagnosis not present

## 2021-01-05 LAB — CBC
HCT: 46.4 % (ref 39.0–52.0)
Hemoglobin: 15.7 g/dL (ref 13.0–17.0)
MCH: 30.3 pg (ref 26.0–34.0)
MCHC: 33.8 g/dL (ref 30.0–36.0)
MCV: 89.6 fL (ref 80.0–100.0)
Platelets: 238 10*3/uL (ref 150–400)
RBC: 5.18 MIL/uL (ref 4.22–5.81)
RDW: 12.7 % (ref 11.5–15.5)
WBC: 7.4 10*3/uL (ref 4.0–10.5)
nRBC: 0 % (ref 0.0–0.2)

## 2021-01-05 LAB — BASIC METABOLIC PANEL
Anion gap: 8 (ref 5–15)
BUN: 17 mg/dL (ref 6–20)
CO2: 21 mmol/L — ABNORMAL LOW (ref 22–32)
Calcium: 9.4 mg/dL (ref 8.9–10.3)
Chloride: 112 mmol/L — ABNORMAL HIGH (ref 98–111)
Creatinine, Ser: 0.97 mg/dL (ref 0.61–1.24)
GFR, Estimated: >60 mL/min (ref >60)
Glucose, Bld: 157 mg/dL — ABNORMAL HIGH (ref 70–99)
Potassium: 3.7 mmol/L (ref 3.5–5.1)
Sodium: 141 mmol/L (ref 135–145)

## 2021-01-05 LAB — MAGNESIUM: Magnesium: 1.9 mg/dL (ref 1.7–2.4)

## 2021-01-05 LAB — CBG MONITORING, ED: Glucose-Capillary: 113 mg/dL — ABNORMAL HIGH (ref 70–99)

## 2021-01-05 LAB — D-DIMER, QUANTITATIVE: D-Dimer, Quant: 0.53 ug/mL-FEU — ABNORMAL HIGH (ref 0.00–0.50)

## 2021-01-05 LAB — CK: Total CK: 67 U/L (ref 49–397)

## 2021-01-05 LAB — TROPONIN I (HIGH SENSITIVITY): Troponin I (High Sensitivity): <2 ng/L (ref <18)

## 2021-01-05 MED ORDER — SODIUM CHLORIDE 0.9 % IV BOLUS
1000.0000 mL | Freq: Once | INTRAVENOUS | Status: AC
  Administered 2021-01-05: 1000 mL via INTRAVENOUS

## 2021-01-05 MED ORDER — IOHEXOL 350 MG/ML SOLN
100.0000 mL | Freq: Once | INTRAVENOUS | Status: AC | PRN
  Administered 2021-01-05: 100 mL via INTRAVENOUS

## 2021-01-05 MED ORDER — LACTATED RINGERS IV BOLUS
1000.0000 mL | Freq: Once | INTRAVENOUS | Status: AC
  Administered 2021-01-06: 1000 mL via INTRAVENOUS

## 2021-01-05 NOTE — ED Provider Notes (Signed)
Care assumed from Dr. Jeraldine Loots, patient with chest pain and elevated D-dimer pending CT angiogram of chest.  Also, he is tachycardic and will be getting some IV fluids.  CT angiogram shows no evidence of pulmonary embolism or other acute process.  Patient relates that he is still is feeling generally achy.  He is getting an additional IV fluid and will also give a dose of ketorolac.  I suspect that this is actually a viral illness.  Heart rate has come down following additional fluid, he had some nausea initially with ketorolac but pain has improved.  At this point, I still feel that this is a flulike illness.  He is discharged with instructions to drink plenty of fluids and take over-the-counter analgesics as needed for pain.  Return if symptoms are worsening.  Results for orders placed or performed during the hospital encounter of 01/05/21  Basic metabolic panel  Result Value Ref Range   Sodium 141 135 - 145 mmol/L   Potassium 3.7 3.5 - 5.1 mmol/L   Chloride 112 (H) 98 - 111 mmol/L   CO2 21 (L) 22 - 32 mmol/L   Glucose, Bld 157 (H) 70 - 99 mg/dL   BUN 17 6 - 20 mg/dL   Creatinine, Ser 0.86 0.61 - 1.24 mg/dL   Calcium 9.4 8.9 - 57.8 mg/dL   GFR, Estimated >46 >96 mL/min   Anion gap 8 5 - 15  CBC  Result Value Ref Range   WBC 7.4 4.0 - 10.5 K/uL   RBC 5.18 4.22 - 5.81 MIL/uL   Hemoglobin 15.7 13.0 - 17.0 g/dL   HCT 29.5 28.4 - 13.2 %   MCV 89.6 80.0 - 100.0 fL   MCH 30.3 26.0 - 34.0 pg   MCHC 33.8 30.0 - 36.0 g/dL   RDW 44.0 10.2 - 72.5 %   Platelets 238 150 - 400 K/uL   nRBC 0.0 0.0 - 0.2 %  CK  Result Value Ref Range   Total CK 67 49 - 397 U/L  Magnesium  Result Value Ref Range   Magnesium 1.9 1.7 - 2.4 mg/dL  D-dimer, quantitative  Result Value Ref Range   D-Dimer, Quant 0.53 (H) 0.00 - 0.50 ug/mL-FEU  POC CBG, ED  Result Value Ref Range   Glucose-Capillary 113 (H) 70 - 99 mg/dL  CBG monitoring, ED  Result Value Ref Range   Glucose-Capillary 138 (H) 70 - 99 mg/dL   Troponin I (High Sensitivity)  Result Value Ref Range   Troponin I (High Sensitivity) <2 <18 ng/L   DG Chest 2 View  Result Date: 01/05/2021 CLINICAL DATA:  Left-sided chest pain. EXAM: CHEST - 2 VIEW COMPARISON:  September 14, 2018 FINDINGS: Low lung volumes are seen. The heart size and mediastinal contours are within normal limits. Both lungs are clear. The visualized skeletal structures are unremarkable. IMPRESSION: No active cardiopulmonary disease. Electronically Signed   By: Aram Candela M.D.   On: 01/05/2021 20:38   CT ANGIO CHEST PE W OR WO CONTRAST  Result Date: 01/06/2021 CLINICAL DATA:  Left chest pain EXAM: CT ANGIOGRAPHY CHEST WITH CONTRAST TECHNIQUE: Multidetector CT imaging of the chest was performed using the standard protocol during bolus administration of intravenous contrast. Multiplanar CT image reconstructions and MIPs were obtained to evaluate the vascular anatomy. CONTRAST:  OMNIPAQUE IOHEXOL 350 MG/ML SOLN COMPARISON:  None. FINDINGS: Cardiovascular: Contrast injection is sufficient to demonstrate satisfactory opacification of the pulmonary arteries to the segmental level. There is no pulmonary embolus or evidence  of right heart strain. The size of the main pulmonary artery is normal. Heart size is normal, with no pericardial effusion. The course and caliber of the aorta are normal. There is no atherosclerotic calcification. Opacification decreased due to pulmonary arterial phase contrast bolus timing. Mediastinum/Nodes: No mediastinal, hilar or axillary lymphadenopathy. Normal visualized thyroid. Thoracic esophageal course is normal. Lungs/Pleura: Airways are patent. No pleural effusion, lobar consolidation, pneumothorax or pulmonary infarction. Upper Abdomen: Contrast bolus timing is not optimized for evaluation of the abdominal organs. The visualized portions of the organs of the upper abdomen are normal. Musculoskeletal: No chest wall abnormality. No bony spinal  canal stenosis. Review of the MIP images confirms the above findings. IMPRESSION: No pulmonary embolus or other acute thoracic abnormality. Electronically Signed   By: Deatra Robinson M.D.   On: 01/06/2021 00:01   Images viewed by me.    Dione Booze, MD 01/06/21 [phone]

## 2021-01-05 NOTE — ED Triage Notes (Signed)
Patient c/o sudden onset sharp left chest pain with nausea and tingling in left arm x40 minutes ago. Hx type 1 diabetes.

## 2021-01-05 NOTE — ED Provider Notes (Signed)
[facility]-EMERGENCY DEPT Provider Note   CSN: [id number] Arrival date & time: 01/05/21  2002     History Chief Complaint  Patient presents with  . Chest Pain    [name] is a 40 y.o. male.  HPI Patient with a history of type 1 diabetes, hypercholesterolemia presents with chest pain and palpitations. He was in his usual state of health today, actually having improved after having adjustments to his insulin pump made recently.  However, about 2 hours ago the patient developed palpitations, chest pressure.  Pressure eased after few moments, the palpitations were persistent.  Subsequently the pressure returned, sternal, superior, heavy.  And the patient presents for evaluation.  He notes that throughout this his heart rate was elevated, not dropping below about 110 bpm. Beyond the patient's recent insulin pump adjustments no recent medication change, diet change, activity change. He does note recent weight loss as he was withholding food intake due to difficulty with obtaining appropriate insulin dosing.     Past Medical History:  Diagnosis Date  . ADHD (attention deficit hyperactivity disorder)   . Asthma   . Autonomic neuropathy due to diabetes (HCC)   . Diabetes (HCC)   . Fatigue   . Goiter   . High blood pressure   . High cholesterol   . Hypercholesterolemia   . Hypertension   . Hypoglycemia associated with diabetes (HCC)   . Obesity   . Tachycardia   . Type 1 diabetes mellitus not at goal [facility])   . Uncontrolled DM with microalbuminuria or microproteinuria     Patient Active Problem List   Diagnosis Date Noted  . Nuclear sclerotic cataract of both eyes 09/12/2020  . Ischemic optic neuritis, right 07/19/2020  . Cerebral arteriosclerosis 07/19/2020  . Photophobia, left eye 07/19/2020  . Optic neuritis, posterior 07/10/2020  . Color vision deficiency 07/10/2020  . Vitreomacular adhesion of left eye 07/03/2020  . H/O visual field  defect 06/14/2020  . Stable treated proliferative diabetic retinopathy of left eye with macular edema determined by examination associated with type 1 diabetes mellitus (HCC) 05/28/2020  . Benign intracranial hypertension 05/28/2020  . Diabetic maculopathy of right eye with proliferative retinopathy determined by examination associated with type 1 diabetes mellitus (HCC) 05/28/2020  . Vitreous hemorrhage of right eye (HCC) 05/28/2020  . Pain in joint, shoulder region 04/17/2020  . Finger numbness 04/17/2020  . Moderate persistent asthma without complication 12/27/2019  . Contusion of back, right, initial encounter 04/12/2019  . Left shoulder pain 04/12/2019  . Acute pain of left wrist 04/12/2019  . Pseudotumor cerebri syndrome 02/16/2019  . Class 1 obesity with serious comorbidity and body mass index (BMI) of 32.0 to 32.9 in adult 02/16/2019  . Idiopathic normal pressure hydrocephalus (HCC) 12/15/2018  . Severe persistent asthma with acute exacerbation 08/31/2018  . Seasonal and perennial allergic rhinitis 08/31/2018  . Mild intermittent asthma without complication 07/12/2018  . Controlled type 1 diabetes mellitus with stable proliferative retinopathy of both eyes (HCC) 06/09/2018  . Sore throat 04/26/2018  . Moderate persistent asthma with acute exacerbation 04/26/2018  . Other allergic rhinitis 04/26/2018  . Gastroesophageal reflux disease 04/26/2018  . Food allergy 04/26/2018  . Pseudotumor cerebri 03/25/2018  . Morbid obesity with BMI of 40.0-44.9, adult (HCC) 03/25/2018  . Altitude sickness, subsequent encounter 03/25/2018  . Encounter for medication management 03/25/2018  . Snoring 03/25/2018  . Increased intracranial pressure 03/20/2018  . Low back pain 03/10/2018  . Cervicalgia of occipito-atlanto-axial region 08/31/2017  .  Type 1 diabetes mellitus with complication (HCC) 08/31/2017  . Calcified lymph nodes 06/04/2017  . Insulin pump in place 05/28/2017  . Neuropathy  05/28/2017  . 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 Other symptoms and signs involving the nervous system 05/28/2017  . Nightmares REM-sleep type 05/28/2017  . Proliferative diabetic retinopathy, right eye (HCC) 04/11/2017  . Benign paroxysmal positional vertigo 03/29/2017  . Costochondritis 03/06/2017  . Acute upper respiratory infection 03/06/2017  . Combined hyperlipidemia 02/11/2016  . Metatarsalgia of right foot 02/07/2016  . Lactose intolerance 01/07/2016  . Inappropriate sinus tachycardia 06/29/2015  . Thoracic back pain 11/02/2013  . Type 1 diabetes mellitus not at goal [facility])   . Goiter   . Severe obesity (BMI 35.0-39.9) with comorbidity (HCC)   . Hypercholesterolemia   . ADHD (attention deficit hyperactivity disorder)   . Fatigue   . Tachycardia   . Autonomic neuropathy associated with type 1 diabetes mellitus (HCC)   . Hypertension   . Uncontrolled DM with microalbuminuria or microproteinuria   . Hypoglycemia associated with diabetes (HCC)   . DIABETES MELLITUS, I 12/24/2006  . ATTENTION DEFICIT, W/HYPERACTIVITY 12/24/2006    Past Surgical History:  Procedure Laterality Date  . EYE SURGERY Right 05/30/2020   Vitrectomy, Dr. Luciana Axe  . EYE SURGERY  04/2020  . IR 3D INDEPENDENT WKST  07/16/2020  . IR ANGIO INTRA EXTRACRAN SEL COM CAROTID INNOMINATE BILAT MOD SED  07/16/2020  . IR ANGIO VERTEBRAL SEL SUBCLAVIAN INNOMINATE UNI L MOD SED  07/16/2020  . IR ANGIO VERTEBRAL SEL VERTEBRAL UNI R MOD SED  07/16/2020  . IR US GUIDE VASC ACCESS RIGHT  07/16/2020  . REFRACTIVE SURGERY     x9  . WISDOM TOOTH EXTRACTION         Family History  Problem Relation Age of Onset  . Dementia Paternal Aunt   . Schizophrenia Paternal Aunt   . Cancer Maternal Grandmother   . Diabetes Maternal Grandmother        T2 DM  . Cancer Maternal Grandfather   . Heart disease Father   . Thyroid disease Neg Hx     Social  History   Tobacco Use  . Smoking status: Never Smoker  . Smokeless tobacco: Never Used  Vaping Use  . Vaping Use: Never used  Substance Use Topics  . Alcohol use: No  . Drug use: No    Home Medications Prior to Admission medications   Medication Sig Start Date End Date Taking? Authorizing Provider  acetaminophen (TYLENOL) 500 MG tablet Take 1,000 mg by mouth 2 (two) times daily as needed for moderate pain.   Yes [provider]  acetaZOLAMIDE (DIAMOX) 250 MG tablet Take 250 mg by mouth daily. At night   Yes [provider]  acetaZOLAMIDE (DIAMOX) 500 MG capsule TAKE 2 CAPSULES BY MOUTH EVERY MORNING AND ONE IN THE EVENING Patient taking differently: Take 500 mg by mouth daily. At noon 11/14/20  Yes Dohmeier, Porfirio Mylar, MD  albuterol [facility] HFA) 108 (90 Base) MCG/ACT inhaler Inhale 2 puffs into the lungs every 6 (six) hours as needed for wheezing or shortness of breath. 07/14/18  Yes Kozlow, Alvira Philips, MD  albuterol (PROVENTIL) (2.5 MG/3ML) 0.083% nebulizer solution Take 3 mLs (2.5 mg total) by nebulization every 4 (four) hours as needed for wheezing or shortness of breath. 08/31/18  Yes Alfonse Spruce, MD  ALPRAZolam Prudy Feeler) 0.25 MG tablet Take 0.25 mg by mouth at bedtime. 05/01/20  Yes [provider]  atorvastatin (LIPITOR) 20 MG tablet Take 20 mg by mouth daily.  08/08/18  Yes [provider]  AUVI-Q 0.3 MG/0.3ML SOAJ injection Use as directed for life-threatening allergic reaction. Patient taking differently: Inject into the muscle daily as needed for anaphylaxis. Use as directed for life-threatening allergic reaction. 12/27/19  Yes Ambs, Norvel Richards, FNP  budesonide (PULMICORT) 0.5 MG/2ML nebulizer solution Take 2 mLs (0.5 mg total) by nebulization as directed. Patient taking differently: Take 0.5 mg by nebulization daily as needed (wheezing). 07/14/18  Yes Kozlow, Alvira Philips, MD  budesonide-formoterol [facility]) 160-4.5 MCG/ACT inhaler Inhale 2 puffs into the  lungs 2 (two) times daily. Patient taking differently: Inhale 2 puffs into the lungs daily as needed (wheezing). 09/14/18  Yes Alfonse Spruce, MD  calcium carbonate (OSCAL) 1500 (600 Ca) MG TABS tablet Take 600 mg of elemental calcium by mouth daily.   Yes [provider]  Glucagon (BAQSIMI TWO PACK) 3 MG/DOSE POWD Place 1 spray into the nose as directed. Patient taking differently: Place 1 spray into the nose as directed. For low glucose emergency 12/10/20  Yes David Stall, MD  HUMALOG 100 UNIT/ML injection INJECT 300 UNITS UNDER THE SKIN VIA INSULIN PUMP EVERY 48 TO 72 HOURS PER HYPERGLYCEMIA AND DKA PROTOCOLS Patient taking differently: Inject 30 Units into the skin continuous. Pump 01/31/20  Yes David Stall, MD  levocetirizine (XYZAL) 5 MG tablet Take 1 tablet (5 mg total) by mouth daily as needed for allergies. 07/24/20  Yes Ambs, Norvel Richards, FNP  losartan (COZAAR) 50 MG tablet TAKE 1 TABLET(50 MG) BY MOUTH DAILY Patient taking differently: Take 50 mg by mouth daily. 08/17/20  Yes David Stall, MD  Mometasone Furoate [facility] HFA) 200 MCG/ACT AERO For asthma flare, begin Asmanex 200-2 puffs twice a day for 1-2 weeks or until cough and wheeze free Patient taking differently: Inhale 2 puffs into the lungs daily as needed (Asthma). 07/25/20  Yes Ambs, Norvel Richards, FNP  montelukast (SINGULAIR) 10 MG tablet TAKE 1 TABLET(10 MG) BY MOUTH AT BEDTIME Patient taking differently: Take 10 mg by mouth at bedtime. 07/24/20  Yes Ambs, Norvel Richards, FNP  Multiple Vitamin (MULTIVITAMIN WITH MINERALS) TABS tablet Take 1 tablet by mouth daily.   Yes [provider]  NUCALA 100 MG/ML SOAJ INJECT  SUBCUTANEOUSLY EVERY 30 DAYS (GIVEN AT MD  OFFICE) Patient taking differently: Inject 100 mg into the skin every 30 (thirty) days. 03/22/20  Yes Alfonse Spruce, MD  topiramate (TOPAMAX) 50 MG tablet TAKE 1 TABLET(50 MG) BY MOUTH TWICE DAILY Patient taking differently: Take 50 mg by  mouth 2 (two) times daily. 12/11/20  Yes Dohmeier, Porfirio Mylar, MD  Vitamin D, Ergocalciferol, (DRISDOL) 1.25 MG (50000 UNIT) CAPS capsule Take 50,000 Units by mouth every 7 (seven) days.   Yes [provider]  albuterol (PROAIR HFA) 108 (90 Base) MCG/ACT inhaler Inhale 2 puffs into the lungs every 4 (four) hours as needed for wheezing or shortness of breath. Patient not taking: No sig reported 12/27/19   Hetty Blend, FNP  aspirin EC 81 MG tablet Take 162 mg by mouth daily as needed for moderate pain. Swallow whole.    [provider]  Continuous Blood Gluc Receiver (DEXCOM G6 RECEIVER) DEVI 1 Device by Does not apply route as directed. Patient not taking: No sig reported 12/14/20   David Stall, MD  Continuous Blood Gluc Sensor (DEXCOM G6 SENSOR) MISC Inject 1 applicator into the skin as  directed. (change sensor every 10 days) 12/14/20   David StallBrennan, Michael J, MD  Continuous Blood Gluc Transmit (DEXCOM G6 TRANSMITTER) MISC Inject 1 Device into the skin as directed. (re-use up to 8x with each new sensor) 12/14/20   David StallBrennan, Michael J, MD  glucose blood test strip Use to check blood sugar up to 10x daily 12/10/20 12/10/21  David StallBrennan, Michael J, MD  glucose blood test strip Use as instructed up to 6x daily (preferred brand: OneTouch) 12/28/20   David StallBrennan, Michael J, MD  insulin lispro (HUMALOG KWIKPEN) 100 UNIT/ML KwikPen Inject up to 50 units per day Patient not taking: No sig reported 11/22/20   David StallBrennan, Michael J, MD  insulin lispro (INSULIN LISPRO) 100 UNIT/ML KwikPen Junior Inject up to 50 units daily per provider instructions Patient taking differently: Inject into the skin See admin instructions. Inject up to 50 units daily per provider instructions ( Back up ) 12/26/20   David StallBrennan, Michael J, MD  Insulin Pen Needle (PEN NEEDLES) 32G X 4 MM MISC Use to inject insulin 6x per day 11/22/20   David StallBrennan, Michael J, MD  methocarbamol (ROBAXIN) 500 MG tablet Take 1 tablet (500 mg total) by mouth 2 (two)  times daily. Patient not taking: No sig reported 04/26/20   Enid BaasFields, Karl, MD  mupirocin ointment (BACTROBAN) 2 % mupirocin 2 % topical ointment  APP EXT IEN BID Patient not taking: No sig reported    [provider]  nystatin (MYCOSTATIN) 100000 UNIT/ML suspension Take by mouth. Patient not taking: No sig reported 08/08/20   [provider]  omeprazole (PRILOSEC) 20 MG capsule Take 20 mg by mouth daily as needed (for reflux).  Patient not taking: No sig reported    [provider]    Allergies    Flonase [fluticasone], Sulfa antibiotics, Molds & smuts, Sulfites, Tetracyclines & related, Lactose intolerance (gi), and [place] birch  Review of Systems   Review of Systems  Constitutional:       Per HPI, otherwise negative  HENT:       Per HPI, otherwise negative  Respiratory:       Per HPI, otherwise negative  Cardiovascular:       Per HPI, otherwise negative  Gastrointestinal: Negative for vomiting.  Endocrine:       Negative aside from HPI  Genitourinary:       Neg aside from HPI   Musculoskeletal:       Per HPI, otherwise negative  Skin: Negative.   Neurological: Negative for syncope.    Physical Exam Updated Vital Signs BP 130/87 (BP Location: Left Arm)   Pulse (!) 110   Temp 97.6 F (36.4 C) (Oral)   Resp 18   SpO2 97%   Physical Exam Vitals and nursing note reviewed.  Constitutional:      General: He is not in acute distress.    Appearance: He is well-developed.  HENT:     Head: Normocephalic and atraumatic.  Eyes:     Conjunctiva/sclera: Conjunctivae normal.  Cardiovascular:     Rate and Rhythm: Regular rhythm. Tachycardia present.  Pulmonary:     Effort: Pulmonary effort is normal. Tachypnea present. No respiratory distress.     Breath sounds: No stridor.  Abdominal:     General: There is no distension.  Skin:    General: Skin is warm and dry.  Neurological:     Mental Status: He is alert and oriented to person, place, and  time.     ED Results / Procedures /  Treatments   Labs (all labs ordered are listed, but only abnormal results are displayed) Labs Reviewed  BASIC METABOLIC PANEL - Abnormal; Notable for the following components:      Result Value   Chloride 112 (*)    CO2 21 (*)    Glucose, Bld 157 (*)    All other components within normal limits  D-DIMER, QUANTITATIVE - Abnormal; Notable for the following components:   D-Dimer, Quant 0.53 (*)    All other components within normal limits  CBG MONITORING, ED - Abnormal; Notable for the following components:   Glucose-Capillary 113 (*)    All other components within normal limits  CBC  CK  MAGNESIUM  TROPONIN I (HIGH SENSITIVITY)  TROPONIN I (HIGH SENSITIVITY)    EKG EKG Interpretation  Date/Time:  Saturday January 05 2021 20:15:06 EST Ventricular Rate:  118 PR Interval:    QRS Duration: 96 QT Interval:  328 QTC Calculation: 460 R Axis:   -22 Text Interpretation: Sinus tachycardia Probable left atrial enlargement RSR' in V1 or V2, probably normal variant T wave abnormality Abnormal ECG Confirmed by Gerhard Munch [phone]) on 01/05/2021 8:27:40 PM   Already at 115 sinus tach abnormal Pulse oximetry 98% room air normal   Radiology DG Chest 2 View  Result Date: 01/05/2021 CLINICAL DATA:  Left-sided chest pain. EXAM: CHEST - 2 VIEW COMPARISON:  September 14, 2018 FINDINGS: Low lung volumes are seen. The heart size and mediastinal contours are within normal limits. Both lungs are clear. The visualized skeletal structures are unremarkable. IMPRESSION: No active cardiopulmonary disease. Electronically Signed   By: Aram Candela M.D.   On: 01/05/2021 20:38    Procedures Procedures   Medications Ordered in ED Medications  sodium chloride 0.9 % bolus 1,000 mL (0 mLs Intravenous Stopped 01/05/21 2140)  sodium chloride 0.9 % bolus 1,000 mL (1,000 mLs Intravenous New Bag/Given 01/05/21 2309)  iohexol (OMNIPAQUE) 350 MG/ML injection 100 mL (100  mLs Intravenous Contrast Given 01/05/21 2333)    ED Course  I have reviewed the triage vital signs and the nursing notes.  Pertinent labs & imaging results that were available during my care of the patient were reviewed by me and considered in my medical decision making (see chart for details).   On repeat exam patient states that he is feeling somewhat better. Heart rate has diminished a bit.  Initial results discussed, reviewed, normal troponin, nonischemic EKG, absence of history all reassuring, low suspicion for atypical ACS. Initial electrolytes reassuring, no substantial abnormalities, no evidence for DKA. However, the patient is found to have elevated D-dimer.  With consideration of PE given his abrupt onset of symptoms, persistent tachycardia, he is awaiting CT scan.  Dr. Preston Fleeting is aware of the patient. Final Clinical Impression(s) / ED Diagnoses Final diagnoses:  Atypical chest pain     Gerhard Munch, MD 01/05/21 2350

## 2021-01-06 LAB — CBG MONITORING, ED
Glucose-Capillary: 132 mg/dL — ABNORMAL HIGH (ref 70–99)
Glucose-Capillary: 138 mg/dL — ABNORMAL HIGH (ref 70–99)

## 2021-01-06 LAB — TROPONIN I (HIGH SENSITIVITY): Troponin I (High Sensitivity): 5 ng/L (ref <18)

## 2021-01-06 MED ORDER — KETOROLAC TROMETHAMINE 30 MG/ML IJ SOLN
30.0000 mg | Freq: Once | INTRAMUSCULAR | Status: DC
  Filled 2021-01-06: qty 1

## 2021-01-06 MED ORDER — KETOROLAC TROMETHAMINE 15 MG/ML IJ SOLN
15.0000 mg | Freq: Once | INTRAMUSCULAR | Status: AC
  Administered 2021-01-06: 15 mg via INTRAVENOUS

## 2021-01-06 NOTE — ED Notes (Signed)
Patient d/c no concerns.

## 2021-01-06 NOTE — Discharge Instructions (Signed)
Drink plenty of fluids.  Take ibuprofen, naproxen, or acetaminophen as needed for aching.  Return if symptoms are getting worse.

## 2021-01-07 ENCOUNTER — Other Ambulatory Visit: Payer: Self-pay

## 2021-01-07 DIAGNOSIS — E441 Mild protein-calorie malnutrition: Secondary | ICD-10-CM

## 2021-01-07 DIAGNOSIS — M791 Myalgia, unspecified site: Secondary | ICD-10-CM

## 2021-01-08 ENCOUNTER — Encounter: Payer: Self-pay | Admitting: Dietician

## 2021-01-08 ENCOUNTER — Encounter: Payer: 59 | Attending: Family Medicine | Admitting: Dietician

## 2021-01-08 ENCOUNTER — Other Ambulatory Visit: Payer: Self-pay

## 2021-01-08 VITALS — Ht 72.0 in | Wt 214.0 lb

## 2021-01-08 DIAGNOSIS — E440 Moderate protein-calorie malnutrition: Secondary | ICD-10-CM | POA: Diagnosis not present

## 2021-01-08 NOTE — Progress Notes (Signed)
Medical Nutrition Therapy  Appointment Start time:  1000  Appointment End time:  1110  Primary concerns today: Weight Gain/Increasing Calories  Referral diagnosis: E44.0 Mild protein-calorie malnutrition Preferred learning style: No preference indicated Learning readiness: Ready   NUTRITION ASSESSMENT   Anthropometrics  Ht: 6' Wt: 214 lbs  Body mass index is 29.02 kg/m.   Clinical Medical Hx: T1DM, Hyperglycemia, HTN, Retinopathy, Microalbuminuria, Hyperlipidemia Medications: See Chart - Insulin Pump, Dexcom Labs: CBG -285 (Upon admission to ER for hyperglycemia, brought down to 154 before discharge) CO2 - 21 (low) Notable Signs/Symptoms: Fatigue, Weight Loss  Lifestyle & Dietary Hx Recent visit to the ER for hyperglycemia, but pt states they were starving to death. Pt states they were not able to eat due to pump site errors and CGM malfunctions. Pt reports having to walk on a treadmill to keep blood sugar under 400 during this period. Pt was only eating only grilled chicken and salads, states they were getting a max of 900 kcal a day. Pt arrived to appointment stating they are very worn out. Reports some head congestion, rapid tested for COVID negative. Pt reports having difficulty with pump sites in January, talked to their endocrinologist and got pump sites working within the past week. Pt reports increasing carbs slightly since then and is still eating lots of grilled chicken and has added in some egg whites. Pt has also started drinking "Keto" protein shakes. Pt reports lactose intolerance, tried Lactaid milk with no problem. Pt reports being on a restrictive diet the last couple of years, low sodium, same foods every day. Pt states low sodium diet helped them lose weight by avoiding processed/packaged foods. Pt is wearing Medtronic Pump with CGM. Pt is also wearing a Dexcom.  Pt will be getting a new pump next week and assistance on getting it set up.  Pt reports food is a task,  states they get hungry and just eat. Pt states they can eat the same thing every day and be fine with it. Previous preferred carbs- Subway sandwiches, wheat wraps, red apples   Body Composition Scale Date 01/08/2021  Current Body Weight 212.6 lbs  Total Body Fat % 22.1  Visceral Fat 13  Fat-Free Mass % 77.8   Total Body Water % 58.8  Muscle-Mass lbs 40.0  BMI 28.8  Body Fat Displacement          Torso  lbs 29.1         Left Leg  lbs 5.8         Right Leg  lbs 5.8         Left Arm  lbs 2.9         Right Arm   lbs 2.9     Estimated daily fluid intake: ~160 oz Supplements: Daily MV, 1,000mg  Vit D weekly, B12 Sleep: Difficulties Stress / self-care: Fear of food a/w hyperglycemia Current average weekly physical activity: ADLs  24-Hr Dietary Recall First Meal: 1/2 cup liquid egg whites, 1/2 piece of toast, 3 grilled chicken nuggets, 2oz. Protein shake (breakfast/lunch) Snack: none Second Meal: 4 grilled nuggets, 4 fried nuggets, 2 inches of subway spicy [place] Snack: none Third Meal: none Snack: none Beverages: 10 glasses of water  Estimated Energy Needs Calories: 2200 Protein: 115 g   NUTRITION DIAGNOSIS  NB-1.1 Food and nutrition-related knowledge deficit As related to protein-calorie malnutriton.  As evidenced by pt reported inability to eat, ~900 Kcal a day intake, and fear of eating related to adverse glycemic response related  to difficulties with insulin pump administration .   NUTRITION INTERVENTION  Nutrition education (E-1) on the following topics:  Educated patient on the pathophysiology of diabetes. This includes why our bodies need circulating blood sugar, the relationship between insulin and blood sugar, and the results of insulin resistance and/or pancreatic insufficiency on the development of diabetes. Educated patient on factors that contribute to elevation of blood sugars, such as stress, illness, injury,and food choices. Discussed the role that physical  activity plays in lowering blood sugar. Educate patient on the three main macronutrients. Protein, fats, and carbohydrates. Discussed how each of these macronutrients affect blood sugar levels, especially carbohydrate, and the importance of eating a consistent amount of carbohydrate throughout the day. Educated patient on carbohydrate counting, 15g of carbohydrate equals one carb choice. Advised patient on the importance of consistently checking their blood sugar, and recognizing how lifestyle and food choices affect those numbers. Educated patient on the "Rule of 15". Treat low blood sugar, below 70 mg/dL, by eating 14H of fast acting carbohydrate and rechecking blood sugar after 15 minutes. If blood sugar is still low, repeat. Discussed the role of diabetes medications in getting blood sugar under control, and considerations that may need to be taken to avoid complications.  Handouts Provided Include   Yellow Meal Card  Balanced Plate Food List  Learning Style & Readiness for Change Teaching method utilized: Visual & Auditory  Demonstrated degree of understanding via: Teach Back  Barriers to learning/adherence to lifestyle change: Insulin pump complications  Goals Established by Pt  Try to hit 2,200 calories a day, and 115 g of protein  Increase carbs gradually AS TOLERATED. Try adding no more than 15g of carbohydrate to each meal and lower the amount if blood glucose values go higher than 200.   Consult your yellow meal card for serving sizes of carbs to get 15g.  Add in some nuts, seeds, and beans to increase your healthy fats and protein.  Try pumpkin seeds, a boiled egg, and an oil-based dressing to your salad.  Work in any type of physical activity you can to help lower your blood sugar if it becomes high.  Arm circles, brisk walk, lifting cans over your head.   MONITORING & EVALUATION Dietary intake, weekly physical activity, and glycemic control in 6 weeks.  Next Steps   Patient is to monitor blood sugar very closely while increasing calories.

## 2021-01-08 NOTE — Patient Instructions (Addendum)
   Try to hit 2,200 calories a day, and 115 g of protein  Increase carbs gradually AS TOLERATED. Try adding no more than 15g of carbohydrate to each meal and lower the amount if blood glucose values go higher than 200.   Consult your yellow meal card for serving sizes of carbs to get 15g.  Add in some nuts, seeds, and beans to increase your healthy fats and protein.  Try pumpkin seeds, a boiled egg, and an oil-based dressing to your salad.  Work in any type of physical activity you can to help lower your blood sugar if it becomes high.  Arm circles, brisk walk, lifting cans over your head.

## 2021-01-14 ENCOUNTER — Other Ambulatory Visit: Payer: Self-pay | Admitting: Family Medicine

## 2021-01-15 ENCOUNTER — Telehealth: Payer: Self-pay | Admitting: "Endocrinology

## 2021-01-15 ENCOUNTER — Encounter (INDEPENDENT_AMBULATORY_CARE_PROVIDER_SITE_OTHER): Payer: Self-pay

## 2021-01-15 NOTE — Telephone Encounter (Signed)
1. [name] called earlier. I returned his call. 2. About a week ago he developed 4 days of diarrhea, nausea, and pain last week. He now has some residual nausea and abdominal pain, but is now able to eat and drink. Even though he is eating, his BGs are not rising very much. He is still receiving his basal rate, but has not had to take much in terms of correction boluses. He has been taking in a lot of protein, but not too many carbs.  3. It sounds as if he has had a fairly severe stomach flu. Inadvertently, he has been making things worse by pushing protein intake and reducing carb intake. I asked him to check his ketones, but he does not have any strips at home. I asked him to ask one of his family members to purchase the strips tomorrow. 4. I suggested that he try the BRAG diet (bananas and breads, rice and noodles, applesauce, and Gatorade). That will give him enough carbs to prevent ketosis. He will know when his intestines have healed up when his serum glucoses rise with carb intake as usual. 5. I asked him to call me if he has any further problems.  Molli Knock, MD, CDE

## 2021-01-16 ENCOUNTER — Ambulatory Visit (INDEPENDENT_AMBULATORY_CARE_PROVIDER_SITE_OTHER): Payer: 59 | Admitting: Family Medicine

## 2021-01-16 ENCOUNTER — Other Ambulatory Visit (HOSPITAL_COMMUNITY): Payer: Self-pay | Admitting: Interventional Radiology

## 2021-01-16 ENCOUNTER — Other Ambulatory Visit: Payer: Self-pay

## 2021-01-16 VITALS — BP 108/78 | Ht 72.0 in | Wt 213.0 lb

## 2021-01-16 DIAGNOSIS — I771 Stricture of artery: Secondary | ICD-10-CM

## 2021-01-16 DIAGNOSIS — K3184 Gastroparesis: Secondary | ICD-10-CM | POA: Diagnosis not present

## 2021-01-16 DIAGNOSIS — M791 Myalgia, unspecified site: Secondary | ICD-10-CM

## 2021-01-16 NOTE — Patient Instructions (Addendum)
Make sure you're staying hydrated, continue with the BRAT diet and hopefully over next 1-2 days you can advance to a more regular diet.   Beverely Low is out for 2 more weeks unfortunately. You're scheduled to see the Dietitian group in 3 more weeks - call and see if they have a cancellation and ask for a more concrete plan - we will send them this message as well. Hopefully the blood sugar response you're getting right now is a trend and you don't have the issues you did overnight as you recover from the stomach virus. Call Dr. Fransico Michael if you continue to have problems before the weekend - we need to get this sorted out. Ok for light cardio (walking, cycling) when you feel you've improved enough to this point but our goals in order are hydration, calories/carbs, protein, then we can work on you building back muscle. We will put in a referral to the GI doctors to evaluate you for gastroparesis. Follow up with me in 2-3 weeks for reevaluation.

## 2021-01-17 ENCOUNTER — Encounter: Payer: Self-pay | Admitting: Family Medicine

## 2021-01-17 NOTE — Progress Notes (Signed)
PCP: [name], PA-C  Subjective:   HPI: Patient is a 40 y.o. male here for hand and foot pain.  3/9: Patient reports over the past 2 months he has felt increasingly weak. Primary complaint today is feeling like his feet and hands are 'down to the bone' and it feels uncomfortable when holding the steering wheel, walking. Feels at times like he cannot hold his body weight. Of note he's had difficulty with his insulin pump in getting sites to work (better past 5 days). Relatedly he's lost about 10+ pounds over the past 1-2 months. Reports large caloric deficit taking in 800-900 calories a day over past couple months. Denies numbness or tingling in feet or hands. Microfilament testing has been normal per endo notes. He is also scheduled to see neurologist tomorrow. Has regular appointments with diabetes educator but not dietitian. Denies nausea or vomiting though appetite is not what it has been. Autonomic dysfunction noted in chart - asked him about gastroparesis and he reports history of this being mentioned remotely.  3/23: Patient reports he feels like he's worse than last visit. Biggest problem is recent stomach virus with nausea, diarrhea, abdominal pain - feels this has improved but was present for several days. Blood sugars not increasing with his carbohydrate intake - even had to turn off his basal rate because blood sugar was dropping. This morning it appears to be rising in response to diet, had no ketones this morning and basal is now back on. Stomach feels crampy. Has not seen GI in past. Started BRAT diet as recommended by Dr. Fransico Michael. Had initial visit to dietitian and doesn't follow up for about 3-4 more weeks - most discussion was general in the initial visit.  Past Medical History:  Diagnosis Date  . ADHD (attention deficit hyperactivity disorder)   . Asthma   . Autonomic neuropathy due to diabetes (HCC)   . Diabetes (HCC)   . Fatigue   . Goiter   . High  blood pressure   . High cholesterol   . Hypercholesterolemia   . Hypertension   . Hypoglycemia associated with diabetes (HCC)   . Obesity   . Tachycardia   . Type 1 diabetes mellitus not at goal [facility])   . Uncontrolled DM with microalbuminuria or microproteinuria     Current Outpatient Medications on File Prior to Visit  Medication Sig Dispense Refill  . acetaminophen (TYLENOL) 500 MG tablet Take 1,000 mg by mouth 2 (two) times daily as needed for moderate pain.    [name] acetaZOLAMIDE (DIAMOX) 250 MG tablet Take 250 mg by mouth daily. At night    . acetaZOLAMIDE (DIAMOX) 500 MG capsule TAKE 2 CAPSULES BY MOUTH EVERY MORNING AND ONE IN THE EVENING (Patient taking differently: Take 500 mg by mouth daily. At noon) 90 capsule 1  . albuterol (PROAIR HFA) 108 (90 Base) MCG/ACT inhaler Inhale 2 puffs into the lungs every 6 (six) hours as needed for wheezing or shortness of breath. 1 Inhaler 2  . albuterol (PROVENTIL) (2.5 MG/3ML) 0.083% nebulizer solution Take 3 mLs (2.5 mg total) by nebulization every 4 (four) hours as needed for wheezing or shortness of breath. 75 mL 1  . ALPRAZolam (XANAX) 0.25 MG tablet Take 0.25 mg by mouth at bedtime.    [name] aspirin EC 81 MG tablet Take 162 mg by mouth daily as needed for moderate pain. Swallow whole.    [name] atorvastatin (LIPITOR) 20 MG tablet Take 20 mg by mouth daily.   0  .  AUVI-Q 0.3 MG/0.3ML SOAJ injection Use as directed for life-threatening allergic reaction. (Patient taking differently: Inject into the muscle daily as needed for anaphylaxis. Use as directed for life-threatening allergic reaction.) 2 each 2  . budesonide (PULMICORT) 0.5 MG/2ML nebulizer solution Take 2 mLs (0.5 mg total) by nebulization as directed. (Patient taking differently: Take 0.5 mg by nebulization daily as needed (wheezing).) 2 mL 3  . budesonide-formoterol (SYMBICORT) 160-4.5 MCG/ACT inhaler Inhale 2 puffs into the lungs 2 (two) times daily. (Patient taking differently: Inhale 2 puffs  into the lungs daily as needed (wheezing).) 1 Inhaler 5  . calcium carbonate (OSCAL) 1500 (600 Ca) MG TABS tablet Take 600 mg of elemental calcium by mouth daily.    . Continuous Blood Gluc Receiver (DEXCOM G6 RECEIVER) DEVI 1 Device by Does not apply route as directed. (Patient not taking: No sig reported) 1 each 2  . Continuous Blood Gluc Sensor (DEXCOM G6 SENSOR) MISC Inject 1 applicator into the skin as directed. (change sensor every 10 days) 3 each 11  . Continuous Blood Gluc Transmit (DEXCOM G6 TRANSMITTER) MISC Inject 1 Device into the skin as directed. (re-use up to 8x with each new sensor) 1 each 3  . Glucagon (BAQSIMI TWO PACK) 3 MG/DOSE POWD Place 1 spray into the nose as directed. (Patient taking differently: Place 1 spray into the nose as directed. For low glucose emergency) 2 each 3  . glucose blood test strip Use to check blood sugar up to 10x daily 300 each 11  . glucose blood test strip Use as instructed up to 6x daily (preferred brand: OneTouch) 180 each 11  . HUMALOG 100 UNIT/ML injection INJECT 300 UNITS UNDER THE SKIN VIA INSULIN PUMP EVERY 48 TO 72 HOURS PER HYPERGLYCEMIA AND DKA PROTOCOLS (Patient taking differently: Inject 30 Units into the skin continuous. Pump) 120 mL 5  . insulin lispro (INSULIN LISPRO) 100 UNIT/ML KwikPen Junior Inject up to 50 units daily per provider instructions (Patient taking differently: Inject into the skin See admin instructions. Inject up to 50 units daily per provider instructions ( Back up )) 15 mL 11  . Insulin Pen Needle (PEN NEEDLES) 32G X 4 MM MISC Use to inject insulin 6x per day 200 each 5  . levocetirizine (XYZAL) 5 MG tablet Take 1 tablet (5 mg total) by mouth daily as needed for allergies. 30 tablet 5  . losartan (COZAAR) 50 MG tablet TAKE 1 TABLET(50 MG) BY MOUTH DAILY (Patient taking differently: Take 50 mg by mouth daily.) 90 tablet 1  . Mometasone Furoate (ASMANEX HFA) 200 MCG/ACT AERO For asthma flare, begin Asmanex 200-2 puffs twice  a day for 1-2 weeks or until cough and wheeze free (Patient taking differently: Inhale 2 puffs into the lungs daily as needed (Asthma).) 13 g 5  . montelukast (SINGULAIR) 10 MG tablet TAKE 1 TABLET(10 MG) BY MOUTH AT BEDTIME 30 tablet 0  . Multiple Vitamin (MULTIVITAMIN WITH MINERALS) TABS tablet Take 1 tablet by mouth daily.    [name] NUCALA 100 MG/ML SOAJ INJECT 100MG  SUBCUTANEOUSLY EVERY 30 DAYS (GIVEN AT MD  OFFICE) (Patient taking differently: Inject 100 mg into the skin every 30 (thirty) days.) 1 mL 11  . ondansetron (ZOFRAN) 4 MG tablet SMARTSIG:2 By Mouth Twice Daily PRN    . topiramate (TOPAMAX) 50 MG tablet TAKE 1 TABLET(50 MG) BY MOUTH TWICE DAILY (Patient taking differently: Take 50 mg by mouth 2 (two) times daily.) 60 tablet 5  . Vitamin D, Ergocalciferol, (DRISDOL) 1.25 MG (50000  UNIT) CAPS capsule Take 50,000 Units by mouth every 7 (seven) days.    . [DISCONTINUED] omeprazole (PRILOSEC) 20 MG capsule Take 20 mg by mouth daily as needed (for reflux).  (Patient not taking: No sig reported)     Current Facility-Administered Medications on File Prior to Visit  Medication Dose Route Frequency Provider Last Rate Last Admin  . gadopentetate dimeglumine (MAGNEVIST) injection 20 mL  20 mL Intravenous Once PRN Dohmeier, Porfirio Mylararmen, MD      . Mepolizumab SOLR 100 mg  100 mg Subcutaneous Q28 days Alfonse SpruceGallagher, Joel Louis, MD   100 mg at 02/16/19 1018    Past Surgical History:  Procedure Laterality Date  . EYE SURGERY Right 05/30/2020   Vitrectomy, Dr. Luciana Axeankin  . EYE SURGERY  04/2020  . IR 3D INDEPENDENT WKST  07/16/2020  . IR ANGIO INTRA EXTRACRAN SEL COM CAROTID INNOMINATE BILAT MOD SED  07/16/2020  . IR ANGIO VERTEBRAL SEL SUBCLAVIAN INNOMINATE UNI L MOD SED  07/16/2020  . IR ANGIO VERTEBRAL SEL VERTEBRAL UNI R MOD SED  07/16/2020  . IR US GUIDE VASC ACCESS RIGHT  07/16/2020  . REFRACTIVE SURGERY     x9  . WISDOM TOOTH EXTRACTION      Allergies  Allergen Reactions  . Flonase [Fluticasone]  Shortness Of Breath  . Sulfa Antibiotics     Shortness of breath, increased heart rate; "happened quite a while ago"  . Molds & Smuts Other (See Comments)    Aggravate asthma  . Sulfites Itching and Swelling  . Tetracyclines & Related Other (See Comments)    increased intracranial pressure  . Lactose Intolerance (Gi) Other (See Comments)    Upset stomach  . White [place] Cough    Social History   Socioeconomic History  . Marital status: Single    Spouse name: Not on file  . Number of children: Not on file  . Years of education: Not on file  . Highest education level: Not on file  Occupational History  . Not on file  Tobacco Use  . Smoking status: Never Smoker  . Smokeless tobacco: Never Used  Vaping Use  . Vaping Use: Never used  Substance and Sexual Activity  . Alcohol use: No  . Drug use: No  . Sexual activity: Not on file  Other Topics Concern  . Not on file  Social History Narrative   ** Merged History Encounter **       Social Determinants of Health   Financial Resource Strain: Not on file  Food Insecurity: Not on file  Transportation Needs: Not on file  Physical Activity: Not on file  Stress: Not on file  Social Connections: Not on file  Intimate Partner Violence: Not on file    Family History  Problem Relation Age of Onset  . Dementia Paternal Aunt   . Schizophrenia Paternal Aunt   . Cancer Maternal Grandmother   . Diabetes Maternal Grandmother        T2 DM  . Cancer Maternal Grandfather   . Heart disease Father   . Thyroid disease Neg Hx     BP 108/78   Ht 6' (1.829 m)   Wt 213 lb (96.6 kg)   BMI 28.89 kg/m   [facility] Exercise 09/11/2020  Frequency of aerobic exercise (# of days/week) 5  Average time in minutes 30  Frequency of strengthening activities (# of days/week) 0    No flowsheet data found.  Review of Systems: See HPI above.  Objective:  Physical Exam:  Gen: NAD, comfortable in exam room but appears  fatigued/tired  Upper extremities. No deformity, swelling, bruising. FROM with 5/5 strength all joints. No focal atrophy though decreased muscle bulk compared to visits from a year ago.   Assessment & Plan:  1. Hand and foot 'pain' and weakness - we again discussed focus at this time is on his T1 DM, caloric intake, staying hydrated with his recent viral infection.  Try to get in with dietitian sooner than 3 weeks from now - hope for a more concrete plan with follow-up visit.  Will place referral to GI to evaluate for possible gastroparesis.  Discussed light cardio ok when he feels he's improved enough.  Call Dr. Fransico Michael if he has additional problems before the weekend.  Will see him back in 2-3 weeks.

## 2021-01-22 ENCOUNTER — Other Ambulatory Visit: Payer: Self-pay

## 2021-01-22 ENCOUNTER — Encounter: Payer: Self-pay | Admitting: Allergy and Immunology

## 2021-01-22 ENCOUNTER — Telehealth: Payer: Self-pay

## 2021-01-22 ENCOUNTER — Ambulatory Visit (INDEPENDENT_AMBULATORY_CARE_PROVIDER_SITE_OTHER): Payer: 59 | Admitting: Allergy and Immunology

## 2021-01-22 VITALS — BP 110/72 | HR 80 | Temp 97.8°F | Resp 18 | Ht 70.5 in | Wt 215.8 lb

## 2021-01-22 DIAGNOSIS — K219 Gastro-esophageal reflux disease without esophagitis: Secondary | ICD-10-CM

## 2021-01-22 DIAGNOSIS — M6281 Muscle weakness (generalized): Secondary | ICD-10-CM | POA: Diagnosis not present

## 2021-01-22 DIAGNOSIS — J3089 Other allergic rhinitis: Secondary | ICD-10-CM

## 2021-01-22 DIAGNOSIS — J4550 Severe persistent asthma, uncomplicated: Secondary | ICD-10-CM

## 2021-01-22 DIAGNOSIS — R252 Cramp and spasm: Secondary | ICD-10-CM

## 2021-01-22 DIAGNOSIS — J302 Other seasonal allergic rhinitis: Secondary | ICD-10-CM

## 2021-01-22 NOTE — Progress Notes (Signed)
[place]   Follow-up Note  Referring Provider: Lucila MaineSpencer, [name], PA-C Primary Provider: Lucila MaineSpencer, [name], PA-C Date of Office Visit: 01/22/2021  Subjective:   [name] (DOB: 03/23/1981) is a 40 y.o. male who [facility] in re-evaluation of the following:  HPI: [name] returns to this clinic in evaluation of asthma and allergic rhinitis and reflux.  His last visit with me in this clinic was 15 Mar 2019 and he did visit with our nurse practitioner on 24 July 2020.  Overall his airway has really done very well while using mepolizumab.  He does not have any significant issues with shortness of breath and rarely uses a short acting bronchodilator and does not use any of his inhalers including either Symbicort or Asmanex at this point.  He does not exercise.  He has muscle weakness and he has lost a fair amount of muscle mass and he has lost 25 pounds in the past 3 months.  He does describe an issue with developing muscle cramps.  He states that sometimes his muscles "vibrate".  He has had very little issue with his upper airway at this point in time while continuing on mepolizumab and does not need to use any nasal steroid at this point.  He still has chronic abdominal discomfort and cramping and he is seeing a gastroenterologist this week regarding this issue.  Interestingly, he has noticed that his insulin requirement has decreased dramatically recently.  When he eats food now it does not bump up his insulin requirements.  He still continues on acetazolamide but at a much lower dose than he was using in the past.  Higher doses of acetazolamide were definitely giving problems with controlling his respiratory function.  He is now on 750 mg as directed by a [facility] neurologist and [facility] neuro-ophthalmologist.  The plan is to go down to 500 mg if possible at some point in the future.  He has  received 3 Pfizer COVID vaccines and a flu vaccine.  Allergies as of 01/22/2021      Reactions   Flonase [fluticasone] Shortness Of Breath   Sulfa Antibiotics    Shortness of breath, increased heart rate; "happened quite a while ago"   Molds & Smuts Other (See Comments)   Aggravate asthma   Sulfites Itching, Swelling   Tetracyclines & Related Other (See Comments)   increased intracranial pressure   Lactose Intolerance (gi) Other (See Comments)   Upset stomach   White [place]ch Cough      Medication List    acetaminophen 500 MG tablet Commonly known as: TYLENOL Take 1,000 mg by mouth 2 (two) times daily as needed for moderate pain.   acetaZOLAMIDE 250 MG tablet Commonly known as: DIAMOX Take 250 mg by mouth daily. At night   acetaZOLAMIDE 500 MG capsule Commonly known as: DIAMOX TAKE 2 CAPSULES BY MOUTH EVERY MORNING AND ONE IN THE EVENING   albuterol 108 (90 Base) MCG/ACT inhaler Commonly known as: ProAir HFA Inhale 2 puffs into the lungs every 6 (six) hours as needed for wheezing or shortness of breath.   albuterol (2.5 MG/3ML) 0.083% nebulizer solution Commonly known as: PROVENTIL Take 3 mLs (2.5 mg total) by nebulization every 4 (four) hours as needed for wheezing or shortness of breath.   ALPRAZolam 0.25 MG tablet Commonly known as: XANAX Take 0.25 mg by mouth at bedtime.   aspirin EC 81 MG tablet  Take 162 mg by mouth daily as needed for moderate pain. Swallow whole.   atorvastatin 20 MG tablet Commonly known as: LIPITOR Take 20 mg by mouth daily.   Auvi-Q 0.3 mg/0.3 mL Soaj injection Generic drug: EPINEPHrine Use as directed for life-threatening allergic reaction.   Baqsimi Two Pack 3 MG/DOSE Powd Generic drug: Glucagon Place 1 spray into the nose as directed. What changed: additional instructions   calcium carbonate 1500 (600 Ca) MG Tabs tablet Commonly known as: OSCAL Take 600 mg of elemental calcium by mouth daily.   Dexcom G6 Receiver Devi 1 Device  by Does not apply route as directed.   glucose blood test strip Use to check blood sugar up to 10x daily   HumaLOG 100 UNIT/ML injection Generic drug: insulin lispro INJECT 300 UNITS UNDER THE SKIN VIA INSULIN PUMP EVERY 48 TO 72 HOURS PER HYPERGLYCEMIA AND DKA PROTOCOLS    insulin lispro 100 UNIT/ML KwikPen Junior Generic drug: insulin lispro Inject up to 50 units daily per provider instructions   levocetirizine 5 MG tablet Commonly known as: Xyzal Take 1 tablet (5 mg total) by mouth daily as needed for allergies.   losartan 50 MG tablet Commonly known as: COZAAR TAKE 1 TABLET(50 MG) BY MOUTH DAILY   montelukast 10 MG tablet Commonly known as: SINGULAIR TAKE 1 TABLET(10 MG) BY MOUTH AT BEDTIME   multivitamin with minerals Tabs tablet Take 1 tablet by mouth daily.   Nucala 100 MG/ML Soaj Generic drug: Mepolizumab INJECT 100MG  SUBCUTANEOUSLY EVERY 30 DAYS (GIVEN AT MD  OFFICE)   ondansetron 4 MG tablet Commonly known as: ZOFRAN SMARTSIG:2 By Mouth Twice Daily PRN   Pen Needles 32G X 4 MM Misc Use to inject insulin 6x per day   topiramate 50 MG tablet Commonly known as: TOPAMAX TAKE 1 TABLET(50 MG) BY MOUTH TWICE DAILY   Vitamin D (Ergocalciferol) 1.25 MG (50000 UNIT) Caps capsule Commonly known as: DRISDOL Take 50,000 Units by mouth every 7 (seven) days.       Past Medical History:  Diagnosis Date  . ADHD (attention deficit hyperactivity disorder)   . Asthma   . Autonomic neuropathy due to diabetes (HCC)   . Diabetes (HCC)   . Fatigue   . Goiter   . High blood pressure   . High cholesterol   . Hypercholesterolemia   . Hypertension   . Hypoglycemia associated with diabetes (HCC)   . Malnutrition (HCC)   . Obesity   . Tachycardia   . Type 1 diabetes mellitus not at goal [facility])   . Uncontrolled DM with microalbuminuria or microproteinuria     Past Surgical History:  Procedure Laterality Date  . EYE SURGERY Right 05/30/2020   Vitrectomy, Dr. 07/30/2020   . EYE SURGERY  04/2020  . IR 3D INDEPENDENT WKST  07/16/2020  . IR ANGIO INTRA EXTRACRAN SEL COM CAROTID INNOMINATE BILAT MOD SED  07/16/2020  . IR ANGIO VERTEBRAL SEL SUBCLAVIAN INNOMINATE UNI L MOD SED  07/16/2020  . IR ANGIO VERTEBRAL SEL VERTEBRAL UNI R MOD SED  07/16/2020  . IR 07/18/2020 GUIDE VASC ACCESS RIGHT  07/16/2020  . REFRACTIVE SURGERY     x9  . WISDOM TOOTH EXTRACTION      Review of systems negative except as noted in HPI / PMHx or noted below:  Review of Systems  Constitutional: Negative.   HENT: Negative.   Eyes: Negative.   Respiratory: Negative.   Cardiovascular: Negative.   Gastrointestinal: Negative.   Genitourinary: Negative.  Musculoskeletal: Negative.   Skin: Negative.   Neurological: Negative.   Endo/Heme/Allergies: Negative.   Psychiatric/Behavioral: Negative.      Objective:   Vitals:   01/22/21 1105  BP: 110/72  Pulse: 80  Resp: 18  Temp: 97.8 F (36.6 C)   Height: 5' 10.5" (179.1 cm)  Weight: 215 lb 12.8 oz (97.9 kg)   Physical Exam Constitutional:      Appearance: He is not diaphoretic.     Comments: Needed to use both arms to arise from a sitting position to an upright position from his chair  HENT:     Head: Normocephalic.     Right Ear: Tympanic membrane, ear canal and external ear normal.     Left Ear: Tympanic membrane, ear canal and external ear normal.     Nose: Nose normal. No mucosal edema or rhinorrhea.     Mouth/Throat:     Pharynx: Uvula midline. No oropharyngeal exudate.  Eyes:     Conjunctiva/sclera: Conjunctivae normal.  Neck:     Thyroid: No thyromegaly.     Trachea: Trachea normal. No tracheal tenderness or tracheal deviation.  Cardiovascular:     Rate and Rhythm: Normal rate and regular rhythm.     Heart sounds: Normal heart sounds, S1 normal and S2 normal. No murmur heard.   Pulmonary:     Effort: No respiratory distress.     Breath sounds: Normal breath sounds. No stridor. No wheezing or rales.   Lymphadenopathy:     Head:     Right side of head: No tonsillar adenopathy.     Left side of head: No tonsillar adenopathy.     Cervical: No cervical adenopathy.  Skin:    Findings: No erythema or rash.     Nails: There is no clubbing.  Neurological:     Mental Status: He is alert.     Diagnostics:    Spirometry was performed and demonstrated an FEV1 of 3.49 at 82 % of predicted.   Results of blood tests obtained 03 January 2021 at [facility] identifies creatinine kinase 60 U/L, C-reactive protein 0.11 mg/DL, sed rate 5 mm/HR  Assessment and Plan:   1. Asthma, severe persistent, well-controlled   2. Seasonal and perennial allergic rhinitis   3. Gastroesophageal reflux disease, unspecified whether esophagitis present   4. Muscle weakness (generalized)   5. Muscle cramping     1. Continue to Treat and prevent inflammation:   A. montelukast 10 mg tablet once a day  B. Mepolizumab every 4 weeks   2. If needed:   A. nasal saline / wash  B. OTC antihistamine  C. Proair HFA / Xopenex 2 inhalations or albuterol every 4-6 hours  3. Consider magnesium [phone] mg twice a day for muscle spasms  4.  Continue follow-up with neurologist and neuro-ophthalmologist.  Tell neurologist about muscle weakness and muscle loss and "vibration" and muscle spasms  5.  Continue follow-up with gastroenterologist  6. Return in 6 months or earlier if problem   [name] appears to be doing pretty good from a respiratory standpoint on his montelukast and mepolizumab and we are not going to change any of his therapy regarding this issue at this point.  He has weight loss, muscle loss, muscle weakness, muscle spasms, and what almost sounds like intermittent fasciculations suggesting possible motor neuron disease.  I have asked him to relay these issues to his neurologist who is presently following him for pseudotumor cerebri at [facility].  He has had lots  of problems with acetazolamide since  starting this medication and may be he is also developing a neurological or myopathic side effect from the use of this medicine as well and may be his muscle weakness is just feature of some form of diabetic myopathy.  Obviously he has lots of GI issues and needs to follow-up with gastroenterology.  I will see him back in this clinic in 6 months or earlier if there is a problem.  Laurette Schimke, MD Allergy / Immunology [facility]

## 2021-01-22 NOTE — Telephone Encounter (Signed)
[name] is the son of my neighbors and has weight loss, poor appetite. Needs NGI with me. Can you put in for appt this Friday AM, double book if needed. Thanks   His phone number is [phone]  His mother, Lavonna Rua may be best to help with scheduling [phone]   His [birth year]b is Jul 12, 1981    01/25/21 at 910 am

## 2021-01-22 NOTE — Patient Instructions (Addendum)
  1. Continue to Treat and prevent inflammation:   A. montelukast 10 mg tablet once a day  B. Mepolizumab every 4 weeks   2. If needed:   A. nasal saline / wash  B. OTC antihistamine  C. Proair HFA / Xopenex 2 inhalations or albuterol every 4-6 hours  3. Consider magnesium [phone] mg twice a day for muscle spasms  4.  Continue follow-up with neurologist and neuro-ophthalmologist.  Tell neurologist about muscle weakness and muscle loss and "vibration" and muscle spasms  5.  Continue follow-up with gastroenterologist  6. Return in 6 months or earlier if problem

## 2021-01-22 NOTE — Telephone Encounter (Signed)
Spoke with the pt and he has been advised of the appt.  Address was provided.  The pt has been advised of the information and verbalized understanding.

## 2021-01-23 ENCOUNTER — Encounter: Payer: Self-pay | Admitting: Allergy and Immunology

## 2021-01-23 ENCOUNTER — Telehealth: Payer: Self-pay | Admitting: "Endocrinology

## 2021-01-23 DIAGNOSIS — E11649 Type 2 diabetes mellitus with hypoglycemia without coma: Secondary | ICD-10-CM

## 2021-01-23 DIAGNOSIS — E1065 Type 1 diabetes mellitus with hyperglycemia: Secondary | ICD-10-CM

## 2021-01-23 DIAGNOSIS — E049 Nontoxic goiter, unspecified: Secondary | ICD-10-CM

## 2021-01-23 NOTE — Telephone Encounter (Signed)
1.[name] called. His BGs are not going up as much as he would expect. He is not having any diarrhea, but is having some abdominal cramps. One day he did not take any boluses and his BG never went above 150. The highest BGs he's had were in the 200s. He is not having any low BGs. He has not been very active because his BGs will drop.  2. He could have a continuing inflammation due to viral illness, celiac disease, inflammatory bowel disease, or Addison's disease. He will see GI on Friday, 4/01/2. 3. I asked him to come to the clinic between 8 AM-9 AM tomorrow morning. I will order a TTG IgA, IgA, ACTH, and cortisol to be drawn then. We may need to perform an ACTH stimulation test.  Molli Knock, MD, CDE

## 2021-01-25 ENCOUNTER — Telehealth (INDEPENDENT_AMBULATORY_CARE_PROVIDER_SITE_OTHER): Payer: Self-pay

## 2021-01-25 ENCOUNTER — Encounter: Payer: Self-pay | Admitting: Gastroenterology

## 2021-01-25 ENCOUNTER — Ambulatory Visit (INDEPENDENT_AMBULATORY_CARE_PROVIDER_SITE_OTHER): Payer: 59 | Admitting: Gastroenterology

## 2021-01-25 ENCOUNTER — Other Ambulatory Visit: Payer: Self-pay

## 2021-01-25 VITALS — BP 110/74 | HR 88 | Ht 70.5 in | Wt 213.2 lb

## 2021-01-25 DIAGNOSIS — Z9641 Presence of insulin pump (external) (internal): Secondary | ICD-10-CM

## 2021-01-25 DIAGNOSIS — R634 Abnormal weight loss: Secondary | ICD-10-CM | POA: Diagnosis not present

## 2021-01-25 LAB — LIPID PANEL
Cholesterol: 197 mg/dL (ref <200)
HDL: 40 mg/dL (ref >=40)
LDL Cholesterol (Calc): 135 mg/dL (calc) — ABNORMAL HIGH
Non-HDL Cholesterol (Calc): 157 mg/dL (calc) — ABNORMAL HIGH (ref <130)
Total CHOL/HDL Ratio: 4.9 (calc) (ref <5.0)
Triglycerides: 111 mg/dL (ref <150)

## 2021-01-25 NOTE — Patient Instructions (Addendum)
If you are age 39 or younger, your body mass index should be between 19-25. Your Body mass index is 30.17 kg/m. If this is out of the aformentioned range listed, please consider follow up with your Primary Care Provider.   You have been scheduled for an endoscopy. Please follow written instructions given to you at your visit today. If you use inhalers (even only as needed), please bring them with you on the day of your procedure.  We have referred you to endocrinology.  Someone should contact you for an appointment.  Please let our office know if no one contacts you regarding an appointment.  You have been scheduled for an MRI at [facility] on 02-02-2021. Your appointment time is 10am. Please arrive 30 minutes prior to your appointment time for registration purposes. Please make certain not to have anything to eat or drink 6 hours prior to your test. In addition, if you have any metal in your body, have a pacemaker or defibrillator, please be sure to let your ordering physician know. This test typically takes 45 minutes to 1 hour to complete. Should you need to reschedule, please call [phone] to do so.  Thank you for entrusting me with your care and choosing [facility].  Dr Christella Hartigan

## 2021-01-25 NOTE — Progress Notes (Signed)
HPI: This is a very pleasant 40 year old man whom I am meeting for the first time.  He lives down the street from me and I am friends with both of his parents.  He has type 1 diabetes and has had significant blood sugar control recently, he was having issues with one of his insulin pumps and is transitioning to a new pump.  During this time he noted that despite eating his insulin levels have not been rising as he normally would.  This led him to under eat, practically starvation diet.  He tells me he was eating about 800 cal a day for a month or 2.  He lost 20 pounds.  He had was having difficulty was a his endocrinologist office, not getting quick responses as he is hoped for.  He has cramping in his abdomen, he can sometimes be constipated he has had a bit of diarrhea at times but generally his bowels are slightly constipated and not much to worry about.  He has no overt GI bleeding.  He has noted muscle loss with his significant weight loss recently.  He has a depressed affect.  He sees a Veterinary surgeon, therapist weekly.   Old Data Reviewed: March 2022 CT scan abdomen pelvis with IV and oral contrast through [facility] done for abdominal pain.  "No findings to explain the patient's pain.  Specifically no evidence of appendicitis.  Right renal stones."  September 2019 gastric emptying scan was normal  Blood work March 2022 showed normal CBC, normal basic metabolic profile except for blood sugar 157.  Hepatic function panel February 2022 was normal  Review of systems: Pertinent positive and negative review of systems were noted in the above HPI section. All other review negative.   Past Medical History:  Diagnosis Date  . ADHD (attention deficit hyperactivity disorder)   . Anxiety   . Asthma   . Autonomic neuropathy due to diabetes (HCC)   . Chronic headaches   . Diabetes (HCC)   . Fatigue   . Goiter   . High blood pressure   . High cholesterol   .  Hypercholesterolemia   . Hypertension   . Hypoglycemia associated with diabetes (HCC)   . Malnutrition (HCC)   . Obesity   . Tachycardia   . Type 1 diabetes mellitus not at goal [facility])   . Uncontrolled DM with microalbuminuria or microproteinuria     Past Surgical History:  Procedure Laterality Date  . EYE SURGERY Right 05/30/2020   Vitrectomy, Dr. Luciana Axe  . EYE SURGERY  04/2020  . IR 3D INDEPENDENT WKST  07/16/2020  . IR ANGIO INTRA EXTRACRAN SEL COM CAROTID INNOMINATE BILAT MOD SED  07/16/2020  . IR ANGIO VERTEBRAL SEL SUBCLAVIAN INNOMINATE UNI L MOD SED  07/16/2020  . IR ANGIO VERTEBRAL SEL VERTEBRAL UNI R MOD SED  07/16/2020  . IR US GUIDE VASC ACCESS RIGHT  07/16/2020  . REFRACTIVE SURGERY     x9  . WISDOM TOOTH EXTRACTION      Current Outpatient Medications  Medication Sig Dispense Refill  . acetaminophen (TYLENOL) 500 MG tablet Take 1,000 mg by mouth 2 (two) times daily as needed for moderate pain.    [name] acetaZOLAMIDE (DIAMOX) 250 MG tablet Take 250 mg by mouth daily. At night    . acetaZOLAMIDE (DIAMOX) 500 MG capsule TAKE 2 CAPSULES BY MOUTH EVERY MORNING AND ONE IN THE EVENING (Patient taking differently: Take 500 mg by mouth daily. At noon) 90 capsule 1  .  albuterol (PROAIR HFA) 108 (90 Base) MCG/ACT inhaler Inhale 2 puffs into the lungs every 6 (six) hours as needed for wheezing or shortness of breath. 1 Inhaler 2  . albuterol (PROVENTIL) (2.5 MG/3ML) 0.083% nebulizer solution Take 3 mLs (2.5 mg total) by nebulization every 4 (four) hours as needed for wheezing or shortness of breath. 75 mL 1  . ALPRAZolam (XANAX) 0.25 MG tablet Take 0.25 mg by mouth at bedtime.    [name]. aspirin EC 81 MG tablet Take 162 mg by mouth daily as needed for moderate pain. Swallow whole.    [name]. atorvastatin (LIPITOR) 20 MG tablet Take 20 mg by mouth daily.   0  . AUVI-Q 0.3 MG/0.3ML SOAJ injection Use as directed for life-threatening allergic reaction. (Patient taking differently: Inject into the muscle  daily as needed for anaphylaxis. Use as directed for life-threatening allergic reaction.) 2 each 2  . budesonide (PULMICORT) 0.5 MG/2ML nebulizer solution Take 2 mLs (0.5 mg total) by nebulization as directed. (Patient taking differently: Take 0.5 mg by nebulization daily as needed (wheezing).) 2 mL 3  . budesonide-formoterol (SYMBICORT) 160-4.5 MCG/ACT inhaler Inhale 2 puffs into the lungs 2 (two) times daily. (Patient taking differently: Inhale 2 puffs into the lungs daily as needed (wheezing).) 1 Inhaler 5  . calcium carbonate (OSCAL) 1500 (600 Ca) MG TABS tablet Take 600 mg of elemental calcium by mouth daily.    . Continuous Blood Gluc Receiver (DEXCOM G6 RECEIVER) DEVI 1 Device by Does not apply route as directed. 1 each 2  . Glucagon (BAQSIMI TWO PACK) 3 MG/DOSE POWD Place 1 spray into the nose as directed. (Patient taking differently: Place 1 spray into the nose as directed. For low glucose emergency) 2 each 3  . glucose blood test strip Use to check blood sugar up to 10x daily 300 each 11  . HUMALOG 100 UNIT/ML injection INJECT 300 UNITS UNDER THE SKIN VIA INSULIN PUMP EVERY 48 TO 72 HOURS PER HYPERGLYCEMIA AND DKA PROTOCOLS (Patient taking differently: Inject 30 Units into the skin continuous. Pump) 120 mL 5  . Insulin Human (INSULIN PUMP) SOLN Inject into the skin.    [name]. insulin lispro (INSULIN LISPRO) 100 UNIT/ML KwikPen Junior Inject up to 50 units daily per provider instructions (Patient taking differently: Inject into the skin See admin instructions. Inject up to 50 units daily per provider instructions ( Back up )) 15 mL 11  . Insulin Pen Needle (PEN NEEDLES) 32G X 4 MM MISC Use to inject insulin 6x per day 200 each 5  . levocetirizine (XYZAL) 5 MG tablet Take 1 tablet (5 mg total) by mouth daily as needed for allergies. 30 tablet 5  . losartan (COZAAR) 50 MG tablet TAKE 1 TABLET(50 MG) BY MOUTH DAILY (Patient taking differently: Take 50 mg by mouth daily.) 90 tablet 1  . Mometasone  Furoate (ASMANEX HFA) 200 MCG/ACT AERO For asthma flare, begin Asmanex 200-2 puffs twice a day for 1-2 weeks or until cough and wheeze free (Patient taking differently: Inhale 2 puffs into the lungs daily as needed (Asthma).) 13 g 5  . montelukast (SINGULAIR) 10 MG tablet TAKE 1 TABLET(10 MG) BY MOUTH AT BEDTIME 30 tablet 0  . Multiple Vitamin (MULTIVITAMIN WITH MINERALS) TABS tablet Take 1 tablet by mouth daily.    [name]. NUCALA 100 MG/ML SOAJ INJECT 100MG  SUBCUTANEOUSLY EVERY 30 DAYS (GIVEN AT MD  OFFICE) (Patient taking differently: Inject 100 mg into the skin every 30 (thirty) days.) 1 mL 11  . ondansetron (ZOFRAN)  4 MG tablet SMARTSIG:2 By Mouth Twice Daily PRN    . topiramate (TOPAMAX) 50 MG tablet TAKE 1 TABLET(50 MG) BY MOUTH TWICE DAILY (Patient taking differently: Take 50 mg by mouth 2 (two) times daily.) 60 tablet 5  . Vitamin D, Ergocalciferol, (DRISDOL) 1.25 MG (50000 UNIT) CAPS capsule Take 50,000 Units by mouth every 7 (seven) days.     Current Facility-Administered Medications  Medication Dose Route Frequency Provider Last Rate Last Admin  . Mepolizumab SOLR 100 mg  100 mg Subcutaneous Q28 days Alfonse Spruce, MD   100 mg at 02/16/19 1018   Facility-Administered Medications Ordered in Other Visits  Medication Dose Route Frequency Provider Last Rate Last Admin  . gadopentetate dimeglumine (MAGNEVIST) injection 20 mL  20 mL Intravenous Once PRN Dohmeier, Porfirio Mylar, MD        Allergies as of 01/25/2021 - Review Complete 01/25/2021  Allergen Reaction Noted  . Flonase [fluticasone] Shortness Of Breath 06/10/2017  . Sulfa antibiotics  12/10/2020  . Molds & smuts Other (See Comments) 03/09/2018  . Sulfites Itching and Swelling 02/04/2018  . Tetracyclines & related Other (See Comments) 03/09/2018  . Lactose intolerance (gi) Other (See Comments) 01/05/2021  . White birch Cough 04/10/2020    Family History  Problem Relation Age of Onset  . Dementia Paternal Aunt   . Schizophrenia  Paternal Aunt   . Cancer Maternal Grandmother        type unknown  . Diabetes Maternal Grandmother        T2 DM  . Cancer Maternal Grandfather        type unknown  . Irritable bowel syndrome Mother   . Heart disease Father   . Hypertension Father   . Hyperlipidemia Father   . Thyroid disease Sister   . Stomach cancer Paternal Aunt   . Pancreatic cancer Paternal Aunt   . Colon cancer Paternal Aunt   . Diabetes Maternal Aunt   . Allergic rhinitis Neg Hx   . Angioedema Neg Hx   . Asthma Neg Hx   . Atopy Neg Hx   . Eczema Neg Hx   . Immunodeficiency Neg Hx   . Urticaria Neg Hx     Social History   Socioeconomic History  . Marital status: Single    Spouse name: Not on file  . Number of children: 0  . Years of education: Not on file  . Highest education level: Not on file  Occupational History  . Occupation: Geophysical data processor  Tobacco Use  . Smoking status: Never Smoker  . Smokeless tobacco: Never Used  Vaping Use  . Vaping Use: Never used  Substance and Sexual Activity  . Alcohol use: No  . Drug use: No  . Sexual activity: Not on file  Other Topics Concern  . Not on file  Social History Narrative   ** Merged History Encounter **       Social Determinants of Health   Financial Resource Strain: Not on file  Food Insecurity: Not on file  Transportation Needs: Not on file  Physical Activity: Not on file  Stress: Not on file  Social Connections: Not on file  Intimate Partner Violence: Not on file     Physical Exam: BP 110/74 (BP Location: Left Arm, Patient Position: Sitting, Cuff Size: Normal)   Pulse 88   Ht 5' 10.5" (1.791 m) Comment: height measured without shoes  Wt 213 lb 4 oz (96.7 kg)   BMI 30.17 kg/m  Constitutional: generally well-appearing Psychiatric: alert  and oriented x3 Eyes: extraocular movements intact Mouth: oral pharynx moist, no lesions Neck: supple no lymphadenopathy Cardiovascular: heart regular rate and rhythm Lungs: clear to  auscultation bilaterally Abdomen: soft, nontender, nondistended, no obvious ascites, no peritoneal signs, normal bowel sounds Extremities: no lower extremity edema bilaterally Skin: no lesions on visible extremities   Assessment and plan: 40 y.o. male with diabetic issues, abdominal cramping, weight loss  I am sure that his near starvation diet for the month or 2 when he was struggling with his blood sugars probably led to most of his weight loss.  I recommended upper endoscopy to exclude other potential causes of weight loss such as H. pylori gastritis, celiac sprue, I plan to biopsy the small intestine even if it is normal to check for underlying celiac sprue.  We did discuss briefly that there can be growths in the pancreas that can secrete insulin which are notoriously difficult to detect by CT scan and so I am ordering an MRI of his pancreas.  We also discussed his difficulty with his diabetic control recently and I recommended that he consider establishing with a new endocrinologist and he would like a referral for 1.  Lastly we discussed HIPAA regulations since I am neighbors with his parents and he understands that I do not plan on discussing his care with his parents or anyone else for that matter unless he specifically acknowledges that I can.   Please see the "Patient Instructions" section for addition details about the plan.   Rob Bunting, MD [facility] 01/25/2021, 9:30 AM  Cc: [name], PA-C  Total time on date of encounter was 60  minutes (this included time spent preparing to see the patient reviewing records; obtaining and/or reviewing separately obtained history; performing a medically appropriate exam and/or evaluation; counseling and educating the patient and family if present; ordering medications, tests or procedures if applicable; and documenting clinical information in the health record).

## 2021-01-25 NOTE — Telephone Encounter (Signed)
Spoke with Dr Christella Hartigan nurse regarding [name]. She said that he is having a EGD Monday. She is sending a letter over explaining everything regarding his diabetes care including pump and Dexcom removal. She said that she would explain everything in depth in the letter.

## 2021-01-28 ENCOUNTER — Encounter (INDEPENDENT_AMBULATORY_CARE_PROVIDER_SITE_OTHER): Payer: Self-pay

## 2021-01-28 ENCOUNTER — Encounter: Payer: Self-pay | Admitting: Gastroenterology

## 2021-01-28 ENCOUNTER — Ambulatory Visit (AMBULATORY_SURGERY_CENTER): Payer: 59 | Admitting: Gastroenterology

## 2021-01-28 ENCOUNTER — Other Ambulatory Visit: Payer: Self-pay

## 2021-01-28 VITALS — BP 110/73 | HR 83 | Temp 97.5°F | Resp 18 | Ht 70.0 in | Wt 213.0 lb

## 2021-01-28 DIAGNOSIS — K3189 Other diseases of stomach and duodenum: Secondary | ICD-10-CM

## 2021-01-28 DIAGNOSIS — R634 Abnormal weight loss: Secondary | ICD-10-CM

## 2021-01-28 DIAGNOSIS — K319 Disease of stomach and duodenum, unspecified: Secondary | ICD-10-CM | POA: Diagnosis not present

## 2021-01-28 DIAGNOSIS — K2980 Duodenitis without bleeding: Secondary | ICD-10-CM

## 2021-01-28 DIAGNOSIS — K2970 Gastritis, unspecified, without bleeding: Secondary | ICD-10-CM

## 2021-01-28 MED ORDER — SODIUM CHLORIDE 0.9 % IV SOLN: 500.0000 mL | Freq: Once | INTRAVENOUS | Status: DC

## 2021-01-28 NOTE — Progress Notes (Signed)
Called to room to assist during endoscopic procedure.  Patient ID and intended procedure confirmed with present staff. Received instructions for my participation in the procedure from the performing physician.  

## 2021-01-28 NOTE — Progress Notes (Signed)
pt tolerated well. VSS. awake and to recovery. Report given to RN. Bite block left insitu to recovery. 

## 2021-01-28 NOTE — Patient Instructions (Signed)
YOU HAD AN ENDOSCOPIC PROCEDURE TODAY AT THE [facility]:   Refer to the procedure report that was given to you for any specific questions about what was found during the examination.  If the procedure report does not answer your questions, please call your gastroenterologist to clarify.  If you requested that your care partner not be given the details of your procedure findings, then the procedure report has been included in a sealed envelope for you to review at your convenience later.  YOU SHOULD EXPECT: Some feelings of bloating in the abdomen. Passage of more gas than usual.  Walking can help get rid of the air that was put into your GI tract during the procedure and reduce the bloating. If you had a lower endoscopy (such as a colonoscopy or flexible sigmoidoscopy) you may notice spotting of blood in your stool or on the toilet paper. If you underwent a bowel prep for your procedure, you may not have a normal bowel movement for a few days.  Please Note:  You might notice some irritation and congestion in your nose or some drainage.  This is from the oxygen used during your procedure.  There is no need for concern and it should clear up in a day or so.  SYMPTOMS TO REPORT IMMEDIATELY:   Following lower endoscopy (colonoscopy or flexible sigmoidoscopy):  Excessive amounts of blood in the stool  Significant tenderness or worsening of abdominal pains  Swelling of the abdomen that is new, acute  Fever of 100F or higher   Following upper endoscopy (EGD)  Vomiting of blood or coffee ground material  New chest pain or pain under the shoulder blades  Painful or persistently difficult swallowing  New shortness of breath  Fever of 100F or higher  Black, tarry-looking stools  For urgent or emergent issues, a gastroenterologist can be reached at any hour by calling [phone]. Do not use MyChart messaging for urgent concerns.    DIET:  We do recommend a small meal at first, but  then you may proceed to your regular diet.  Drink plenty of fluids but you should avoid alcoholic beverages for 24 hours.  ACTIVITY:  You should plan to take it easy for the rest of today and you should NOT DRIVE or use heavy machinery until tomorrow (because of the sedation medicines used during the test).    FOLLOW UP: Our staff will call the number listed on your records 48-72 hours following your procedure to check on you and address any questions or concerns that you may have regarding the information given to you following your procedure. If we do not reach you, we will leave a message.  We will attempt to reach you two times.  During this call, we will ask if you have developed any symptoms of COVID 19. If you develop any symptoms (ie: fever, flu-like symptoms, shortness of breath, cough etc.) before then, please call [phone].  If you test positive for Covid 19 in the 2 weeks post procedure, please call and report this information to us.    If any biopsies were taken you will be contacted by phone or by letter within the next 1-3 weeks.  Please call us at [phone] if you have not heard about the biopsies in 3 weeks.    SIGNATURES/CONFIDENTIALITY: You and/or your care partner have signed paperwork which will be entered into your electronic medical record.  These signatures attest to the fact that that the information above on   your After Visit Summary has been reviewed and is understood.  Full responsibility of the confidentiality of this discharge information lies with you and/or your care-partner. 

## 2021-01-28 NOTE — Op Note (Signed)
[facility] Patient Name: [name] Procedure Date: 01/28/2021 10:21 AM MRN: [id number] Endoscopist: Rachael Fee , MD Age: 40 Referring MD:  Date of Birth: 07-06-1981 Gender: Male Account #: [id number] Procedure:                Upper GI endoscopy Indications:              Weight loss, difficult to control blood sugars,                            generalized abdominal cramping Medicines:                Monitored Anesthesia Care Procedure:                Pre-Anesthesia Assessment:                           - Prior to the procedure, a History and Physical                            was performed, and patient medications and                            allergies were reviewed. The patient's tolerance of                            previous anesthesia was also reviewed. The risks                            and benefits of the procedure and the sedation                            options and risks were discussed with the patient.                            All questions were answered, and informed consent                            was obtained. Prior Anticoagulants: The patient has                            taken no previous anticoagulant or antiplatelet                            agents. ASA Grade Assessment: II - A patient with                            mild systemic disease. After reviewing the risks                            and benefits, the patient was deemed in                            satisfactory condition to undergo the procedure.  After obtaining informed consent, the endoscope was                            passed under direct vision. Throughout the                            procedure, the patient's blood pressure, pulse, and                            oxygen saturations were monitored continuously. The                            Endoscope was introduced through the mouth, and                            advanced to the second part  of duodenum. The upper                            GI endoscopy was accomplished without difficulty.                            The patient tolerated the procedure well. Scope In: Scope Out: Findings:                 Minimal inflammation characterized by erythema was                            found in the gastric antrum. Biopsies were taken                            with a cold forceps for histology.                           The exam was otherwise without abnormality. Complications:            No immediate complications. Estimated blood loss:                            None. Estimated Blood Loss:     Estimated blood loss: none. Impression:               - Very mild gastritis, biopsied to check for H.                            pylori infection. jar 2.                           - The examination was otherwise normal.                           - Normal duodenum mucosa was biopsied to check for                            Celiac Sprue. jar 1. Recommendation:           - Patient has a contact number available for  emergencies. The signs and symptoms of potential                            delayed complications were discussed with the                            patient. Return to normal activities tomorrow.                            Written discharge instructions were provided to the                            patient.                           - Resume previous diet.                           - Continue present medications.                           - Await pathology results.                           - Continue plans for MRI of the pancreas later this                            week; checking for insulinoma. Rachael Fee, MD 01/28/2021 10:39:21 AM This report has been signed electronically.

## 2021-01-30 ENCOUNTER — Encounter: Payer: Self-pay | Admitting: Family Medicine

## 2021-01-30 ENCOUNTER — Telehealth: Payer: Self-pay | Admitting: *Deleted

## 2021-01-30 ENCOUNTER — Telehealth (INDEPENDENT_AMBULATORY_CARE_PROVIDER_SITE_OTHER): Payer: Self-pay

## 2021-01-30 ENCOUNTER — Ambulatory Visit (INDEPENDENT_AMBULATORY_CARE_PROVIDER_SITE_OTHER): Payer: 59 | Admitting: "Endocrinology

## 2021-01-30 ENCOUNTER — Encounter (HOSPITAL_COMMUNITY): Payer: Self-pay | Admitting: Internal Medicine

## 2021-01-30 ENCOUNTER — Ambulatory Visit (INDEPENDENT_AMBULATORY_CARE_PROVIDER_SITE_OTHER): Payer: 59 | Admitting: Family Medicine

## 2021-01-30 ENCOUNTER — Observation Stay (HOSPITAL_COMMUNITY)
Admission: AD | Admit: 2021-01-30 | Discharge: 2021-02-02 | Disposition: A | Discharge status: Home / Self Care | Payer: 59 | Source: Ambulatory Visit | Attending: Internal Medicine | Admitting: Internal Medicine

## 2021-01-30 ENCOUNTER — Encounter (INDEPENDENT_AMBULATORY_CARE_PROVIDER_SITE_OTHER): Payer: Self-pay | Admitting: "Endocrinology

## 2021-01-30 ENCOUNTER — Ambulatory Visit (INDEPENDENT_AMBULATORY_CARE_PROVIDER_SITE_OTHER): Payer: 59 | Admitting: Pharmacist

## 2021-01-30 ENCOUNTER — Other Ambulatory Visit: Payer: Self-pay

## 2021-01-30 VITALS — BP 122/82 | HR 108 | Wt 211.4 lb

## 2021-01-30 VITALS — BP 122/72 | Ht 71.0 in | Wt 212.0 lb

## 2021-01-30 DIAGNOSIS — E039 Hypothyroidism, unspecified: Secondary | ICD-10-CM | POA: Insufficient documentation

## 2021-01-30 DIAGNOSIS — E103553 Type 1 diabetes mellitus with stable proliferative diabetic retinopathy, bilateral: Secondary | ICD-10-CM | POA: Diagnosis not present

## 2021-01-30 DIAGNOSIS — I10 Essential (primary) hypertension: Secondary | ICD-10-CM | POA: Insufficient documentation

## 2021-01-30 DIAGNOSIS — E10649 Type 1 diabetes mellitus with hypoglycemia without coma: Secondary | ICD-10-CM | POA: Diagnosis not present

## 2021-01-30 DIAGNOSIS — J45909 Unspecified asthma, uncomplicated: Secondary | ICD-10-CM | POA: Diagnosis not present

## 2021-01-30 DIAGNOSIS — Z20822 Contact with and (suspected) exposure to covid-19: Secondary | ICD-10-CM | POA: Diagnosis not present

## 2021-01-30 DIAGNOSIS — Z794 Long term (current) use of insulin: Secondary | ICD-10-CM | POA: Insufficient documentation

## 2021-01-30 DIAGNOSIS — E11649 Type 2 diabetes mellitus with hypoglycemia without coma: Secondary | ICD-10-CM

## 2021-01-30 DIAGNOSIS — G932 Benign intracranial hypertension: Secondary | ICD-10-CM | POA: Insufficient documentation

## 2021-01-30 DIAGNOSIS — E782 Mixed hyperlipidemia: Secondary | ICD-10-CM | POA: Diagnosis not present

## 2021-01-30 DIAGNOSIS — E109 Type 1 diabetes mellitus without complications: Secondary | ICD-10-CM

## 2021-01-30 DIAGNOSIS — E162 Hypoglycemia, unspecified: Secondary | ICD-10-CM | POA: Diagnosis present

## 2021-01-30 DIAGNOSIS — Z79899 Other long term (current) drug therapy: Secondary | ICD-10-CM | POA: Insufficient documentation

## 2021-01-30 DIAGNOSIS — Z7982 Long term (current) use of aspirin: Secondary | ICD-10-CM | POA: Insufficient documentation

## 2021-01-30 DIAGNOSIS — E1043 Type 1 diabetes mellitus with diabetic autonomic (poly)neuropathy: Secondary | ICD-10-CM | POA: Insufficient documentation

## 2021-01-30 DIAGNOSIS — E559 Vitamin D deficiency, unspecified: Secondary | ICD-10-CM | POA: Insufficient documentation

## 2021-01-30 DIAGNOSIS — E1069 Type 1 diabetes mellitus with other specified complication: Secondary | ICD-10-CM

## 2021-01-30 LAB — CBC WITH DIFFERENTIAL/PLATELET
Abs Immature Granulocytes: 0.02 10*3/uL (ref 0.00–0.07)
Basophils Absolute: 0.1 10*3/uL (ref 0.0–0.1)
Basophils Relative: 1 %
Eosinophils Absolute: 0 10*3/uL (ref 0.0–0.5)
Eosinophils Relative: 0 %
HCT: 46.9 % (ref 39.0–52.0)
Hemoglobin: 16.3 g/dL (ref 13.0–17.0)
Immature Granulocytes: 0 %
Lymphocytes Relative: 19 %
Lymphs Abs: 1.2 10*3/uL (ref 0.7–4.0)
MCH: 31.2 pg (ref 26.0–34.0)
MCHC: 34.8 g/dL (ref 30.0–36.0)
MCV: 89.8 fL (ref 80.0–100.0)
Monocytes Absolute: 0.5 10*3/uL (ref 0.1–1.0)
Monocytes Relative: 8 %
Neutro Abs: 4.7 10*3/uL (ref 1.7–7.7)
Neutrophils Relative %: 72 %
Platelets: 252 10*3/uL (ref 150–400)
RBC: 5.22 MIL/uL (ref 4.22–5.81)
RDW: 13.4 % (ref 11.5–15.5)
WBC: 6.5 10*3/uL (ref 4.0–10.5)
nRBC: 0 % (ref 0.0–0.2)

## 2021-01-30 LAB — GLUCOSE, CAPILLARY
Glucose-Capillary: 297 mg/dL — ABNORMAL HIGH (ref 70–99)
Glucose-Capillary: 243 mg/dL — ABNORMAL HIGH (ref 70–99)

## 2021-01-30 LAB — COMPREHENSIVE METABOLIC PANEL
ALT: 38 U/L (ref 0–44)
AST: 17 U/L (ref 15–41)
Albumin: 4.2 g/dL (ref 3.5–5.0)
Alkaline Phosphatase: 70 U/L (ref 38–126)
Anion gap: 8 (ref 5–15)
BUN: 17 mg/dL (ref 6–20)
CO2: 22 mmol/L (ref 22–32)
Calcium: 9.3 mg/dL (ref 8.9–10.3)
Chloride: 107 mmol/L (ref 98–111)
Creatinine, Ser: 0.87 mg/dL (ref 0.61–1.24)
GFR, Estimated: >60 mL/min (ref >60)
Glucose, Bld: 308 mg/dL — ABNORMAL HIGH (ref 70–99)
Potassium: 4 mmol/L (ref 3.5–5.1)
Sodium: 137 mmol/L (ref 135–145)
Total Bilirubin: 0.7 mg/dL (ref 0.3–1.2)
Total Protein: 6.9 g/dL (ref 6.5–8.1)

## 2021-01-30 LAB — POCT URINALYSIS DIPSTICK
Glucose, UA: NEGATIVE
Ketones, UA: NEGATIVE

## 2021-01-30 LAB — POCT GLYCOSYLATED HEMOGLOBIN (HGB A1C): Hemoglobin A1C: 6.3 % — AB (ref 4.0–5.6)

## 2021-01-30 LAB — SARS CORONAVIRUS 2 (TAT 6-24 HRS): SARS Coronavirus 2: NEGATIVE

## 2021-01-30 LAB — POCT GLUCOSE (DEVICE FOR HOME USE): Glucose Fasting, POC: 163 mg/dL — AB (ref 70–99)

## 2021-01-30 LAB — HIV ANTIBODY (ROUTINE TESTING W REFLEX): HIV Screen 4th Generation wRfx: NONREACTIVE

## 2021-01-30 MED ORDER — MONTELUKAST SODIUM 10 MG PO TABS
10.0000 mg | ORAL_TABLET | Freq: Every day | ORAL | Status: DC
  Administered 2021-01-30 – 2021-02-01 (×3): 10 mg via ORAL
  Filled 2021-01-30 (×3): qty 1

## 2021-01-30 MED ORDER — ALBUTEROL SULFATE HFA 108 (90 BASE) MCG/ACT IN AERS
2.0000 | INHALATION_SPRAY | Freq: Four times a day (QID) | RESPIRATORY_TRACT | Status: DC | PRN
  Filled 2021-01-30: qty 6.7

## 2021-01-30 MED ORDER — ACETAZOLAMIDE 250 MG PO TABS
250.0000 mg | ORAL_TABLET | Freq: Two times a day (BID) | ORAL | Status: DC
  Administered 2021-01-30 – 2021-02-02 (×6): 250 mg via ORAL
  Filled 2021-01-30 (×7): qty 1

## 2021-01-30 MED ORDER — LOSARTAN POTASSIUM 50 MG PO TABS
50.0000 mg | ORAL_TABLET | Freq: Every day | ORAL | Status: DC
  Administered 2021-01-31 – 2021-02-02 (×3): 50 mg via ORAL
  Filled 2021-01-30 (×3): qty 1

## 2021-01-30 MED ORDER — POLYETHYLENE GLYCOL 3350 17 G PO PACK: 17.0000 g | PACK | Freq: Every day | ORAL | Status: DC | PRN

## 2021-01-30 MED ORDER — ALPRAZOLAM 0.25 MG PO TABS
0.2500 mg | ORAL_TABLET | Freq: Every day | ORAL | Status: DC
  Administered 2021-01-30 – 2021-02-01 (×3): 0.25 mg via ORAL
  Filled 2021-01-30 (×3): qty 1

## 2021-01-30 MED ORDER — SODIUM CHLORIDE 0.9% FLUSH
3.0000 mL | Freq: Two times a day (BID) | INTRAVENOUS | Status: DC
  Administered 2021-01-31 – 2021-02-02 (×4): 3 mL via INTRAVENOUS

## 2021-01-30 MED ORDER — TOPIRAMATE 25 MG PO TABS
50.0000 mg | ORAL_TABLET | Freq: Two times a day (BID) | ORAL | Status: DC
  Administered 2021-01-30 – 2021-02-02 (×6): 50 mg via ORAL
  Filled 2021-01-30 (×7): qty 2

## 2021-01-30 MED ORDER — INSULIN PUMP
Freq: Three times a day (TID) | SUBCUTANEOUS | Status: DC
  Administered 2021-02-01: 0.6 via SUBCUTANEOUS
  Filled 2021-01-30: qty 1

## 2021-01-30 MED ORDER — ENOXAPARIN SODIUM 40 MG/0.4ML ~~LOC~~ SOLN
40.0000 mg | SUBCUTANEOUS | Status: DC
  Administered 2021-01-30 – 2021-02-01 (×2): 40 mg via SUBCUTANEOUS
  Filled 2021-01-30 (×4): qty 0.4

## 2021-01-30 MED ORDER — ATORVASTATIN CALCIUM 10 MG PO TABS
20.0000 mg | ORAL_TABLET | Freq: Every day | ORAL | Status: DC
  Administered 2021-01-30 – 2021-02-02 (×4): 20 mg via ORAL
  Filled 2021-01-30 (×4): qty 2

## 2021-01-30 MED ORDER — ACETAMINOPHEN 325 MG PO TABS: 650.0000 mg | ORAL_TABLET | Freq: Four times a day (QID) | ORAL | Status: DC | PRN

## 2021-01-30 MED ORDER — ACETAMINOPHEN 650 MG RE SUPP: 650.0000 mg | Freq: Four times a day (QID) | RECTAL | Status: DC | PRN

## 2021-01-30 NOTE — Telephone Encounter (Signed)
  Follow up Call-  Call back number 01/28/2021  Post procedure Call Back phone  # [phone]  Permission to leave phone message Yes  Some recent data might be hidden     Patient questions:  Do you have a fever, pain , or abdominal swelling? No. Pain Score  0 *  Have you tolerated food without any problems? Yes.    Have you been able to return to your normal activities? Yes.    Do you have any questions about your discharge instructions: Diet   No. Medications  No. Follow up visit  No.  Do you have questions or concerns about your Care? No.  Actions: * If pain score is 4 or above: No action needed, pain <4.  1. Have you developed a fever since your procedure? NO  2.   Have you had an respiratory symptoms (SOB or cough) since your procedure? NO  3.   Have you tested positive for COVID 19 since your procedure NO  4.   Have you had any family members/close contacts diagnosed with the COVID 19 since your procedure?  NO   If yes to any of these questions please route to Laverna Peace, RN and Karlton Lemon, RN

## 2021-01-30 NOTE — H&P (Addendum)
Date: 01/30/2021               Patient Name:  Joseph DingwallGabriel Joseph Bartling MRN: [id number]  DOB: 07/20/1981 Age / Sex: 40 y.o., male   PCP: Lucila MaineSpencer, [name], PA-C         Medical Service: Internal Medicine Teaching Service         Attending Physician: Dr. Antony ContrasGuilloud    First Contact: Dr. Imogene Burnhen Pager: [phone]  Second Contact: Dr. Huel CoteBasaraba Pager: [phone]       After Hours (After 5p/  First Contact Pager: [phone]  weekends / holidays): Second Contact Pager: [phone]   Chief Complaint: hypoglycemia, fatigue  History of Present Illness:   Joseph DingwallGabriel Joseph Coppernoll is a 40 y.o. male with hx of type 1 diabetes with autonomic neuropathy, HLD, obesity, HTN, idiopathic intracranial hypertension, hypothyroidism presenting for fatigue and low blood sugar over the past 3 weeks as a direct admission from endocrinology. Patient states that about 3 months ago, he kept having severe blood sugar spikes that were only controllable with restricting his diet. He reports weight loss of 10-20 pounds over 2 months. He then had a GI illness with nausea and diarrhea for 3 days, a few days prior to this [facility] for tachycardia and dizziness upon standing with benign cardiac workup. Since then has reported issues with low blood sugar. Over the past 3 weeks, he has increased his caloric intake to 2000 calories per day, however his CBGs would not increase. His insulin pump is only administering basal dose; he has had to turn off his pump at times and decrease his basal rate to prevent severe hypoglycemia. He now tries to keep his BG around 200. He is not sleeping well because of concerned that his BG will drop overnight. Endorses dyspnea on exertion, frequent muscle spasms over the past year. No Fhx of endocrine tumors.States his mood has been poor but denies any SI.    ED Course:  Current Outpatient Medications  Medication Instructions  . acetaminophen (TYLENOL) 1,000 mg, Oral, 2 times daily PRN  .  acetaZOLAMIDE (DIAMOX) 500 MG capsule TAKE 2 CAPSULES BY MOUTH EVERY MORNING AND ONE IN THE EVENING  . acetaZOLAMIDE (DIAMOX) 250 mg, Oral, 2 times daily  . albuterol (PROAIR HFA) 108 (90 Base) MCG/ACT inhaler 2 puffs, Inhalation, Every 6 hours PRN  . albuterol (PROVENTIL) 2.5 mg, Nebulization, Every 4 hours PRN  . ALPRAZolam (XANAX) 0.25 mg, Oral, Daily at bedtime  . aspirin EC 162 mg, Oral, Daily PRN, Swallow whole.  [name]. atorvastatin (LIPITOR) 20 mg, Oral, Daily  . AUVI-Q 0.3 MG/0.3ML SOAJ injection Use as directed for life-threatening allergic reaction.  . budesonide (PULMICORT) 0.5 mg, Nebulization, As directed  . budesonide-formoterol (SYMBICORT) 160-4.5 MCG/ACT inhaler 2 puffs, Inhalation, 2 times daily  . calcium carbonate (OSCAL) 1500 (600 Ca) MG TABS tablet 600 mg of elemental calcium, Oral, Weekly  . Continuous Blood Gluc Receiver (DEXCOM G6 RECEIVER) DEVI 1 Device, Does not apply, As directed  . Glucagon (BAQSIMI TWO PACK) 3 MG/DOSE POWD 1 spray, Nasal, As directed  . glucose blood test strip Use to check blood sugar up to 10x daily  . HUMALOG 100 UNIT/ML injection INJECT 300 UNITS UNDER THE SKIN VIA INSULIN PUMP EVERY 48 TO 72 HOURS PER HYPERGLYCEMIA AND DKA PROTOCOLS  . insulin lispro (INSULIN LISPRO) 100 UNIT/ML KwikPen Junior Inject up to 50 units daily per provider instructions  . Insulin Pen Needle (PEN NEEDLES) 32G X 4  MM MISC Use to inject insulin 6x per day  . levocetirizine (XYZAL) 5 mg, Oral, Daily PRN  . losartan (COZAAR) 50 MG tablet TAKE 1 TABLET(50 MG) BY MOUTH DAILY  . Mometasone Furoate (ASMANEX HFA) 200 MCG/ACT AERO For asthma flare, begin Asmanex 200-2 puffs twice a day for 1-2 weeks or until cough and wheeze free  . montelukast (SINGULAIR) 10 MG tablet TAKE 1 TABLET(10 MG) BY MOUTH AT BEDTIME  . Multiple Vitamin (MULTIVITAMIN WITH MINERALS) TABS tablet 1 tablet, Oral, Daily  . NUCALA 100 MG/ML SOAJ INJECT 100MG  SUBCUTANEOUSLY EVERY 30 DAYS (GIVEN AT MD  OFFICE)  .  topiramate (TOPAMAX) 50 MG tablet TAKE 1 TABLET(50 MG) BY MOUTH TWICE DAILY  . Vitamin D (Ergocalciferol) (DRISDOL) 50,000 Units, Oral, Every 7 days    Allergies as of 01/30/2021 - Review Complete 01/30/2021  Allergen Reaction Noted  . Flonase [fluticasone] Shortness Of Breath 06/10/2017  . Sulfa antibiotics  12/10/2020  . Molds & smuts Other (See Comments) 03/09/2018  . Sulfites Itching and Swelling 02/04/2018  . Tetracyclines & related Other (See Comments) 03/09/2018  . Lactose intolerance (gi) Other (See Comments) 01/05/2021  . White birch Cough 04/10/2020    Past Medical History:  Diagnosis Date  . ADHD (attention deficit hyperactivity disorder)   . Anxiety   . Asthma   . Autonomic neuropathy due to diabetes (HCC)   . Chronic headaches   . Diabetes (HCC)   . Fatigue   . GERD (gastroesophageal reflux disease)   . Goiter   . High blood pressure   . High cholesterol   . Hypercholesterolemia   . Hypertension   . Hypoglycemia associated with diabetes (HCC)   . Malnutrition (HCC)   . Obesity   . Sleep apnea    mild, no cpap  . Tachycardia   . Type 1 diabetes mellitus not at goal [facility])   . Uncontrolled DM with microalbuminuria or microproteinuria     Past Surgical History:  Procedure Laterality Date  . EYE SURGERY Right 05/30/2020   Vitrectomy, Dr. 07/30/2020  . EYE SURGERY  04/2020  . IR 3D INDEPENDENT WKST  07/16/2020  . IR ANGIO INTRA EXTRACRAN SEL COM CAROTID INNOMINATE BILAT MOD SED  07/16/2020  . IR ANGIO VERTEBRAL SEL SUBCLAVIAN INNOMINATE UNI L MOD SED  07/16/2020  . IR ANGIO VERTEBRAL SEL VERTEBRAL UNI R MOD SED  07/16/2020  . IR 07/18/2020 GUIDE VASC ACCESS RIGHT  07/16/2020  . REFRACTIVE SURGERY     x9  . WISDOM TOOTH EXTRACTION      Family History  Problem Relation Age of Onset  . Dementia Paternal Aunt   . Schizophrenia Paternal Aunt   . Cancer Maternal Grandmother        type unknown  . Diabetes Maternal Grandmother        T2 DM  . Cancer Maternal Grandfather         type unknown  . Irritable bowel syndrome Mother   . Heart disease Father   . Hypertension Father   . Hyperlipidemia Father   . Skin cancer Father   . Thyroid disease Sister   . Stomach cancer Paternal Aunt   . Pancreatic cancer Paternal Aunt   . Diabetes Maternal Aunt   . Allergic rhinitis Neg Hx   . Angioedema Neg Hx   . Asthma Neg Hx   . Atopy Neg Hx   . Eczema Neg Hx   . Immunodeficiency Neg Hx   . Urticaria Neg Hx   .  Esophageal cancer Neg Hx   . Colon cancer Neg Hx   . Colon polyps Neg Hx     Social History   Tobacco Use  . Smoking status: Never Smoker  . Smokeless tobacco: Never Used  Vaping Use  . Vaping Use: Never used  Substance Use Topics  . Alcohol use: No  . Drug use: No    Review of Systems: Review of Systems  Constitutional: Positive for malaise/fatigue and weight loss. Negative for chills and fever.  HENT: Negative for congestion and sore throat.   Eyes: Negative for blurred vision and double vision.  Respiratory: Positive for shortness of breath. Negative for cough.   Cardiovascular: Negative for chest pain and palpitations.  Gastrointestinal: Positive for diarrhea. Negative for abdominal pain, nausea and vomiting.  Genitourinary: Negative for dysuria and frequency.  Musculoskeletal: Positive for myalgias.  Neurological: Positive for dizziness and headaches. Negative for loss of consciousness.  All other systems reviewed and are negative.    Physical Exam: Blood pressure 137/79, pulse (!) 102, temperature 98.3 F (36.8 C), resp. rate 18, height 5\' 11"  (1.803 m), weight 96.1 kg, SpO2 99 %. Physical Exam Vitals and nursing note reviewed.  Constitutional:      General: He is not in acute distress.    Appearance: Normal appearance. He is not ill-appearing.  HENT:     Head: Normocephalic and atraumatic.     Comments: Mildly enlarged thyroid, nontender, no focal nodules    Right Ear: External ear normal.     Left Ear: External ear normal.      Nose: Nose normal. No congestion.     Mouth/Throat:     Mouth: Mucous membranes are moist.     Pharynx: No oropharyngeal exudate or posterior oropharyngeal erythema.  Eyes:     Extraocular Movements: Extraocular movements intact.  Cardiovascular:     Rate and Rhythm: Normal rate and regular rhythm.     Pulses: Normal pulses.     Heart sounds: No murmur heard. No friction rub. No gallop.   Pulmonary:     Effort: Pulmonary effort is normal. No respiratory distress.     Breath sounds: Normal breath sounds. No wheezing, rhonchi or rales.  Abdominal:     General: Abdomen is flat. There is no distension.     Palpations: Abdomen is soft.     Tenderness: There is no abdominal tenderness. There is no guarding or rebound.  Musculoskeletal:        General: No swelling, deformity or signs of injury. Normal range of motion.     Cervical back: Normal range of motion.     Right lower leg: No edema.     Left lower leg: No edema.  Skin:    General: Skin is warm and dry.  Neurological:     General: No focal deficit present.     Mental Status: He is alert and oriented to person, place, and time.  Psychiatric:        Mood and Affect: Mood normal.        Behavior: Behavior normal.    No imaging available yet  Assessment & Plan by Problem: Active Problems:   Hypoglycemia   Couper Juncaj is a 40 y.o. male with hx of type 1 diabetes with autonomic neuropathy, HLD, obesity, HTN, idiopathic intracranial hypertension, hypothyroidism presenting for low blood sugar over the past 3 weeks.   Hypoglycemia DM with autonomic neuropathy and diabetic retinopathy Last A1c 6.3% in April 2022. Diagnosed at age 42-19,  currently on insulin pump being managed by his outpatient endocrinologist. Has had poor control in the past, and often adjusts the basal and bolus insulin administration manually. Has significant anxiety about his blood sugar and has been closely managing his insulin pump, has it  turned off about 50% of the time and administers very small boluses <1 unit. Estimates he gets around 20 units daily. His blood sugar log shows no hypoglycemic episodes, average blood sugar of 167. Notes that it has been hard to bring his blood sugar up with food. DDx includes insulinoma, other hypermetabolic state such as another tumor, or psychogenic due to manually adjusting his insulin. -Note that some labs collected by endocrinologist outpatient today which are listed as collected, perhaps as below.  We will hold off on the noncritical labs until tomorrow. -Follow-up HIV, a.m. cortisol, ACTH, C-peptide, proinsulin -Every 4 hours blood sugars, discussed with nursing to alert [place] for blood sugar less than 90 -Continue home insulin pump -Follow-up MRI abdomen  Goiter, previous hypothyroidism Borderline hypothyroid in June 2014 but euthyroid since then, followed by endocrinology.  Negative thyroid peroxidase and thyroglobulin antibodies.  Idiopathic intracranial hypertension Diagnosed in 2019 by neurology with LP with increased opening pressure. Thought due to tetracycline, so this was stopped and started on diamox and topamax. -Continue home Diamox and Topamax  Prior suspected stroke Angiography with neuro in 2021 demonstrated some areas of occlusion, so started on full dose aspirin for secondary stroke prevention.  Patient stopped this sometime ago due to excess bleeding, especially with his insulin pump.  Vitamin D deficiency Last level of 10 on type 1 diabetes with autonomic neuropathy, HLD, obesity, HTN, idiopathic intracranial hypertension, hypothyroidism . Taking 50,000 units weekly.  HTN -Continue Home losartan 50mg  daily.   HLD -Continue home atorvastatin 20 mg  Depression On nursing documentation from outpatient setting reportedly had borderline suicidal ideation today, but denies any thoughts of hurting himself today.  Does endorse decreased mood for the past few weeks.  No  outpatient antidepressants. -Monitor  Dispo: Admit patient to Inpatient with expected length of stay greater than 2 midnights.  Signed: , MD 01/30/2021, 6:54 PM  Pager: [phone]  After 5pm on weekdays and 1pm on weekends: On Call pager: [phone]

## 2021-01-30 NOTE — Telephone Encounter (Signed)
Spoke with Dr. Pearletha Forge and patient is in the office with him currently. Provider states patient reports his pump is off 90% of the time, and yet sugar continues to be low. Patient is eating 2000+ calories a day to maintain his sugar levels. Indicates he does not wear his pump to sleep as he is worried that he will go low and not wake up.  Provider states patient is very depressed about his health, and is "borderline suicidal" patient does not have an active suicide plan, but provider is discussing if a BH intake is appropriate.  Provider states he does not have direct admitting privileges, but is curious is this is something that could be better managed with a stay on the floor.   Patient has a DM maintenance call with Dr. Ladona Ridgel today at 11:15. Will route this to her so she is aware of patient's current state before making the call, and will route to Dr. Fransico Michael as well so he can determine if a direct admit is applicable for patient. Dr. Pearletha Forge will follow up later as well for patient's status.

## 2021-01-30 NOTE — Patient Instructions (Signed)
Follow-up as planned 

## 2021-01-30 NOTE — Progress Notes (Signed)
NEW ADMISSION NOTE New Admission Note: Direct Admit  Arrival Method: Ambulatory Mental Orientation: A&O X4  Telemetry: none  Assessment: Completed Skin: intact  IV: none Pain: 0/10 Tubes: none Safety Measures: Safety Fall Prevention Plan has been given, discussed and signed Admission: Completed [facility] Orientation: Patient has been orientated to the room, unit and staff.  Family:  Orders have been reviewed and implemented. Will continue to monitor the patient. Call light has been placed within reach and bed alarm has been activated.   Annia Belt, RN

## 2021-01-30 NOTE — Progress Notes (Signed)
This is a Pediatric Specialist virtual follow up consult provided via telephone. [name] consented to an telephone visit consult today.  Location of patient: [name] is at home. Location of provider: Zachery Conch, PharmD, CPP, CDCES is at office.   I connected with [name] on by telephone and verified that I am speaking with the correct person using two identifiers. Patient reports turning off his pump most of the day - he turns it off 2-5x/day for "several hours". He is unable to state how long. He estimates he is not wearing pump for ~12 hours each day. He states something happened (did not say what) a few weeks ago and now his BG are constantly low. Patient's speech and train of thought sounded to be delayed when we spoke. Discussed case with Dr. Fransico Michael who advised him to come in today at 12:30 PM. Advised him to make sure he brought in his pump for it to be downloaded.    This appointment required 10 minutes of patient care (this includes precharting, chart review, review of results, virtual care, etc.).  Time spent since initial appt on 01/30/2021: 10 minutes   Thank you for involving clinical pharmacist/diabetes educator to assist in providing this patient's care.   Zachery Conch, PharmD, CPP, CDCES

## 2021-01-30 NOTE — Progress Notes (Signed)
Subjective:  Patient Name: [name] Date of Birth: [birth year]  MRN: [id number]  [name]  presents at his clinic visit today for follow-up of his type 1 diabetes mellitus, goiter, obesity, combined hyperlipidemia, adult ADHD, fatigue, autonomic neuropathy, tachycardia, hypertension, microalbuminuria, hypoglycemia, sinusitis, bronchitis, allergies, proliferative retinopathy, pseudotumor cerebri/increased intracranial hypertension, and unintentional weight loss.[name]   HISTORY OF PRESENT ILLNESS:   [name] is a 40 y.o. Caucasian gentleman. [name] was unaccompanied.  1. The patient was first referred to me on 01/08/2006 by his family physician, Dr. Andreas Blower, for evaluation and management of type 1 diabetes mellitus and related problems. The patient was 40 years old.  A. [name] had been diagnosed with type 1 diabetes somewhere in 2001-2002, at the age of 89-19. He was initially diagnosed with type 2 diabetes mellitus, but was later re-classified as type 1 diabetes mellitus. He had been followed by Dr. Romilda Garret, staff endocrinologist at [facility]. [name] was started on an insulin pump approximately 18 months prior to first seeing me. His pump was a Medtronic Paradigm 712. His blood glucose control was fair-to-poor. He was frequently not checking blood sugars or taking insulin boluses as often as he needed to. He frequently noted fast heart rate. The patient's past medical history was positive for hypertension, for which he was taking lisinopril, and for what his mother called "extreme ADHD". He had previously been taking ADHD medicines but had discontinued them due to adverse effects. He had prior ankle injuries and knee injuries, to include tears of the lateral menisci bilaterally. He had not had any surgeries. 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 As a result, the patient was on the road a lot and spent much  of his time outdoors at field sites. He did not use tobacco or drugs, but did drink alcohol occasionally.  B. On physical examination, his weight was 234 pounds, his height was 70 inches, his BMI was 33.2. Blood pressure was 128/70. Hemoglobin A1c was 9.5%. Heart rate was 96. He had normal affect and fair insight. He had a 25-30 gram goiter. He had normal 1+ DP pulses in his feet. He had normal sensation in his feet to touch, vibration, and monofilament. His CMP was normal except for glucose of 251. His cholesterol was 257, triglycerides 143, HDL 49, and LDL 179. TSH was 1.017, free T4 was 1.06, and free T3 was 3.3. His urinary microalbumin: creatinine ratio was 20.1 (normal less than 30).   C. The patient clearly needed better blood glucose control, which would only occur if he had better adherence to his plan. He appeared to have autonomic neuropathy, manifested by inappropriate sinus tachycardia.  His thyroid goiter suggested that he might have evolving Hashimoto's disease. He stated that he had lost some weight recently. I encouraged him to check his blood sugars more frequently and to take both correction boluses and food boluses.  2. During the past fifteen years, [name] has had to deal with many different problems:  A. T1DM/hypoglycemia:    1). 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 His hemoglobin A1c values had varied from 8.3-11.2%.   2). After starting him on a Medtronic 670G insulin pump and Guardian 3 sensor on 01/12/17, his BG control has improved significantly, despite the illnesses described  below.   B. DM complications: .   1). He has bilateral diabetic retinopathy and is followed by his retinologist, Dr. Deloria Lair, MD.     A). He saw Dr. Zadie Rhine in October 2020 and again in February 2021. There were no new signs of diabetic eye  disease.     2). At [facility] visit with me in July 2021 he asked me to arrange a second opinion retinal consultation for him. I arranged for him to see Dr. Dennard Nip in the [place].    2). He has autonomic neuropathy manifested by inappropriate sinus tachycardia and gastroparesis causing postprandial bloating and GERD. These problems have varied inversely with his degree of BG control.   C. Combined hyperlipidemia:     1). On 12/03/10 his total cholesterol was 270, triglycerides 94, HDL 50, and LDL 201. I started him on Crestor, 10 mg per day. He has since begun treatment with atorvastatin and his cholesterol values had improved.    2). On 12/29/16 his lipids had improved, but his cholesterol values were still elevated. After developing the illnesses noted below, his atorvastatin was discontinued due to concerns that he might be having adverse reactions to the atorvastatin.    D. Allergies, sinusitis, bronchitis, headaches, muscle pains/spasms:   1). Unfortunately, beginning in about June 2018, [name] began to develop recurrent episodes of allergic rhinitis, head fullness, sinusitis, and bronchitis. He also developed headaches, muscle pains and spasms, lethargy, fatigue, and weakness that were presumably due in large part to severe allergies. His atorvastatin and several other medications were stopped at that time. Despite receiving excellent care from his allergist, to include allergy injections and biologic injections, [name]'s allergic issues persisted.    2). He saw Dr. Carmelina Peal on 03/15/19 and again in February 2021. [name]'s allergies and asthma have been okay since has been wearing his covid mask. He has not needed steroids recently. His second sleep study was not bad enough to start on C-pap. In the past 8-10 months these illnesses/problems have largely resolved, in part due to taking a monthly biologic allergy injection.   E. Increased intracranial pressure:    1). [name] was seen in consultation by Dr. Larey Seat, neurology, on 05/28/17 for headaches, tremors of his right hand and foot, shaky vision, and symptoms c/w OSA. His initial sleep study did not show any significant amount of OSA.    2). On 03/08/18, Dr.Dohmeier was notified that [place] eye doctor had seen signs of fluid build up in his right eye. An LP was performed in radiology on 03/09/18. Opening pressure was 28 cm of water, c/w increased intracranial pressure and pseudotumor cerebri. This problem was initially though to be due to tetracycline usage, so TCN was discontinued and Diamox begun. The Diamox doses have been changed over time.    3). On 01/03/19 he had an LP that showed elevated pressure. His diamox dose was increased somewhat. Dr. Brett Fairy also started him on Topamax. He had a follow up visit with Dr. Brett Fairy on 02/23/20. She continued him on low doses of both medications. He still has some adverse effects of the Diamox, but not as bad. He still has some mild fatigue.  G. Adult ADD/depression: [name] was evaluated by Dr Carlena Hurl, a noted neuropsychiatrist in [place] in 2018. At his last follow up visit in November 2018, Dr. Wylene Simmer decided to wait until [name]'s overall health situation  stabilized before trying any new medications.   H. General physical and emotional health: [name] has  been very sick for much of the past 3 years. He has been unable to work full-time and has become almost confined to his home. He has become increasingly anhedonic over time.   I. Neck pains, decreased cervical range of motion, and shoulder girdle pains:   1). Several months ago he began to develop pains in his left upper chest and shoulder area. He saw Dr. Oneida Alar. The pains then went into his right upper chest and shoulder, then into his shoulder blades. Now he can't bend his neck. [place] of his left shoulder was negative.    2). He missed his follow up appointment with Dr. Oneida Alar due to other conflicting medical appointments. I asked him to re-schedule  that appointment.  J. He had an MRI and an MRA of his brain. The MRI of the brain and brainstem down to C3 was normal. The MRA showed the following:  Moderate stenosis of the M1 segment of the left middle cerebral artery.  Negligible flow through the A1 segment of the left anterior cerebral artery.  This could be due to a hypoplastic/aplastic segment, a normal variant.  This could also be due to severe stenosis.  The left A2 segment derived this flow from the right to through the anterior communicating artery a "moderate stenosis of the M1 segment of the left middle cerebral artery: He then had an angiography procedure to evaluate his bilateral carotid  And innominate arterial circulation. That study showed: Approximately 65-70% stenosis of the left internal carotid artery supraclinoid segment secondary to a mildly irregular intracranial arteriosclerotic plaque. No opacification of the left anterior cerebral A1 segment most likely represents a pathologic intracranial occlusion. 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 Approximately 10 mm x 8.3 mm pyramidal configured high-riding jugular bulb. [name] was told to" take 325 mg of ASA daily as a secondary stroke prevention strategy".  K. Following the angiography procedure he developed bilateral palmar hand numbness. Fortunately, these symptoms are improving   L. He has an asthma consultation with Ms Juliann Pares, [place], on 07/24/20. He had mild restrictive disease, essentially unchanged.  M. He had an evaluation by Dr. Celine Ahr, [facility], on 06/19/20 and a follow up visit on 10.01/21. The cardiologist recommended continuing his  ASA and statin therapy, but did not feel any cardiology follow up was needed.     3. [name]'s last [facility] visit was on 01/01/2021. At that visit we continued his insulin pump settings.  I am seeing him today as an urgent add-on because his PCP saw [name] today and thought that [name] looks bad and needs to be admitted.   A. In the interim he has not been well. He had cut way back on his carb intake in order to control his BGs, but is now losing weight..   B. His BGs have been lower. He has almost totally stopped bolusing. He also reduced all of his basal rates. He is often turning off his pump for hours at a time in order to stop giving basal insulin. He is not sleeping due to intentionally staying awake to try to keep his BGs up. He feels emotionally "pretty rough". He is beng evaluated by Dr. Owens Loffler in [place] [facility]. He is also followed by [facility] for Idiopathic Intracranial Hypertension. .  C. He is not having any site problems now. His sensors and  transmitter have been working, but sometimes don't last as long.   D. He says that his health is worse. He feels that he is getting weaker. Muscle spasms continue. He is losing strength. He is having pains in his ankles, shins, and wrists. His headaches have not been as bad.   E. He was found to have vitamin D level of 10. He is now taking 50,00 IU/week.   F. He saw a neurologist at [facility] on 10/15/20. MRA of the brain did not add any additional info. [name] had a follow up visit later on 01/03/21. March 2022.  G. He continues to see his therapist, Ms. [facility], every week. He is using alprazolam, 0.25 mg at bedtime when needed for anxiety. His PCP will prescribe the medication for him.  He thinks the medication and the therapy are helping him.   H. His nausea only occurs occasionally. He does not usually take omeprazole.   I. He continues to have problems with lactose intolerance. When he avoids lactose, however, he is asymptomatic. Some foods such as shellfish cause his throat to swell up.   J. He takes losartan, 50 mg/day. He rarely has any coughing unless his allergies act up. He also takes atorvastatin, 20 mg/day.    H. He saw  Dr Dennard Nip on 06/27/20.  His eyes looked relatively fine. She gave him an injection in his left eye. He appreciated her consultation, but has difficulty obtaining transportation back and forth to [place], so wants to continue with his retinologist, Dr. Zadie Rhine, here in [place].   I. He had an ophthalmology appointment at [facility] 10/23/20. He saw the [facility] neuro-retinal specialist on 12/13/20.  There was no evidence of progression.   J. His insurance plan will not allow him to have any more than 200 BG test strips per month.   4. Pertinent Review of Systems: Constitutional: [name] feels "like garbage, tired, fatigued, and weak". His sleep quality is not good due to intentionally staying awaked to keep track of his BGs.  Eyes: As above. He still has the "sparkles" in his eyes essentially all the time. The visual color changes, muscle spasms, and tingling that were attributed to Diamox still bother him, but less since reducing the Diamox dose. Neck: He has not had any recent anterior neck swelling and tenderness.  Lungs: Breathing has been "okay", but he still sometimes has SOB with exertion.  Heart: He thinks his heart beats about the same. Gastrointestinal: As above. He does not have much postprandial bloating or reflux. when he takes his omeprazole. He does have periumbilical pains. He does have constipation occasionally, but no complaints of excessive hunger, heartburn, upset stomach, stomach aches, or diarrhea, even after discontinuing his omeprazole because he was feeling better.  Legs: As above. He still has spasms of the larger muscles of his legs, but at other times they feel like they are vibrating.  These sensations may last for up to a few hours. Muscle mass and strength seem normal. There are no other complaints of numbness, tingling, burning, or pain. No edema is noted. Feet: There are no obvious foot problems now. He still has episodic foot pains, but not often. There are no complaints of numbness,  or burning.  No edema is noted. Hypoglycemia: He has had many more low BG alerts.  Emotional: "I'm pretty tired of all of this."    5. Pump printout: He changes sites every 3-4 days. Average BG is 167, compared with 156 at his last  visit and with 213 at his prior visit.  He checks BGs 8-14 times per day. BG range was about 90-289.   6. CGM printout: He has been in auto mode 99%. He wears his sensor 96%. Average SG is 154, compared with 155 at his last visit and with 204 at his last prior. Time in Range is 80%, compared with 79% at his last visit. Time above range is 20%. SGs average about 170 at midnight. SGs decrease to about 150 about 4 AM, increase to about 190 at 2 PM, decrease to about 150 at 7 PM, then increase to midnight   PAST MEDICAL, FAMILY, AND SOCIAL HISTORY:  Past Medical History:  Diagnosis Date  . ADHD (attention deficit hyperactivity disorder)   . Anxiety   . Asthma   . Autonomic neuropathy due to diabetes ([place])   . Chronic headaches   . Diabetes ([place])   . Fatigue   . GERD (gastroesophageal reflux disease)   . Goiter   . High blood pressure   . High cholesterol   . Hypercholesterolemia   . Hypertension   . Hypoglycemia associated with diabetes ([place])   . Malnutrition ([place])   . Obesity   . Sleep apnea    mild, no cpap  . Tachycardia   . Type 1 diabetes mellitus not at goal [facility])   . Uncontrolled DM with microalbuminuria or microproteinuria     Family History  Problem Relation Age of Onset  . Dementia Paternal Aunt   . Schizophrenia Paternal Aunt   . Cancer Maternal Grandmother        type unknown  . Diabetes Maternal Grandmother        T2 DM  . Cancer Maternal Grandfather        type unknown  . Irritable bowel syndrome Mother   . Heart disease Father   . Hypertension Father   . Hyperlipidemia Father   . Thyroid disease Sister   . Stomach cancer Paternal Aunt   . Pancreatic cancer Paternal Aunt   . Diabetes Maternal Aunt   . Allergic rhinitis Neg Hx    . Angioedema Neg Hx   . Asthma Neg Hx   . Atopy Neg Hx   . Eczema Neg Hx   . Immunodeficiency Neg Hx   . Urticaria Neg Hx   . Esophageal cancer Neg Hx   . Colon cancer Neg Hx   . Colon polyps Neg Hx      Current Outpatient Medications:  .  acetaminophen (TYLENOL) 500 MG tablet, Take 1,000 mg by mouth 2 (two) times daily as needed for moderate pain., Disp: , Rfl:  .  acetaZOLAMIDE (DIAMOX) 250 MG tablet, Take 250 mg by mouth daily. At night, Disp: , Rfl:  .  acetaZOLAMIDE (DIAMOX) 500 MG capsule, TAKE 2 CAPSULES BY MOUTH EVERY MORNING AND ONE IN THE EVENING (Patient taking differently: Take 500 mg by mouth daily. At noon), Disp: 90 capsule, Rfl: 1 .  albuterol (PROAIR HFA) 108 (90 Base) MCG/ACT inhaler, Inhale 2 puffs into the lungs every 6 (six) hours as needed for wheezing or shortness of breath., Disp: 1 Inhaler, Rfl: 2 .  albuterol (PROVENTIL) (2.5 MG/3ML) 0.083% nebulizer solution, Take 3 mLs (2.5 mg total) by nebulization every 4 (four) hours as needed for wheezing or shortness of breath., Disp: 75 mL, Rfl: 1 .  ALPRAZolam (XANAX) 0.25 MG tablet, Take 0.25 mg by mouth at bedtime., Disp: , Rfl:  .  aspirin EC 81 MG tablet, Take 162  mg by mouth daily as needed for moderate pain. Swallow whole., Disp: , Rfl:  .  atorvastatin (LIPITOR) 20 MG tablet, Take 20 mg by mouth daily. , Disp: , Rfl: 0 .  AUVI-Q 0.3 MG/0.3ML SOAJ injection, Use as directed for life-threatening allergic reaction. (Patient taking differently: Inject into the muscle daily as needed for anaphylaxis. Use as directed for life-threatening allergic reaction.), Disp: 2 each, Rfl: 2 .  budesonide (PULMICORT) 0.5 MG/2ML nebulizer solution, Take 2 mLs (0.5 mg total) by nebulization as directed. (Patient taking differently: Take 0.5 mg by nebulization daily as needed (wheezing).), Disp: 2 mL, Rfl: 3 .  budesonide-formoterol (SYMBICORT) 160-4.5 MCG/ACT inhaler, Inhale 2 puffs into the lungs 2 (two) times daily. (Patient taking  differently: Inhale 2 puffs into the lungs daily as needed (wheezing).), Disp: 1 Inhaler, Rfl: 5 .  calcium carbonate (OSCAL) 1500 (600 Ca) MG TABS tablet, Take 600 mg of elemental calcium by mouth daily., Disp: , Rfl:  .  Continuous Blood Gluc Receiver (DEXCOM G6 RECEIVER) DEVI, 1 Device by Does not apply route as directed., Disp: 1 each, Rfl: 2 .  Glucagon (BAQSIMI TWO PACK) 3 MG/DOSE POWD, Place 1 spray into the nose as directed. (Patient taking differently: Place 1 spray into the nose as directed. For low glucose emergency), Disp: 2 each, Rfl: 3 .  glucose blood test strip, Use to check blood sugar up to 10x daily, Disp: 300 each, Rfl: 11 .  HUMALOG 100 UNIT/ML injection, INJECT 300 UNITS UNDER THE SKIN VIA INSULIN PUMP EVERY 48 TO 72 HOURS PER HYPERGLYCEMIA AND DKA PROTOCOLS (Patient taking differently: Inject 30 Units into the skin continuous. Pump), Disp: 120 mL, Rfl: 5 .  Insulin Human (INSULIN PUMP) SOLN, Inject into the skin., Disp: , Rfl:  .  insulin lispro (INSULIN LISPRO) 100 UNIT/ML KwikPen Junior, Inject up to 50 units daily per provider instructions (Patient taking differently: Inject into the skin See admin instructions. Inject up to 50 units daily per provider instructions ( Back up )), Disp: 15 mL, Rfl: 11 .  Insulin Pen Needle (PEN NEEDLES) 32G X 4 MM MISC, Use to inject insulin 6x per day, Disp: 200 each, Rfl: 5 .  levocetirizine (XYZAL) 5 MG tablet, Take 1 tablet (5 mg total) by mouth daily as needed for allergies., Disp: 30 tablet, Rfl: 5 .  losartan (COZAAR) 50 MG tablet, TAKE 1 TABLET(50 MG) BY MOUTH DAILY (Patient taking differently: Take 50 mg by mouth daily.), Disp: 90 tablet, Rfl: 1 .  Mometasone Furoate (ASMANEX HFA) 200 MCG/ACT AERO, For asthma flare, begin Asmanex 200-2 puffs twice a day for 1-2 weeks or until cough and wheeze free (Patient taking differently: Inhale 2 puffs into the lungs daily as needed (Asthma).), Disp: 13 g, Rfl: 5 .  montelukast (SINGULAIR) 10 MG  tablet, TAKE 1 TABLET(10 MG) BY MOUTH AT BEDTIME, Disp: 30 tablet, Rfl: 0 .  Multiple Vitamin (MULTIVITAMIN WITH MINERALS) TABS tablet, Take 1 tablet by mouth daily., Disp: , Rfl:  .  NUCALA 100 MG/ML SOAJ, INJECT 100MG SUBCUTANEOUSLY EVERY 30 DAYS (GIVEN AT MD  OFFICE) (Patient taking differently: Inject 100 mg into the skin every 30 (thirty) days.), Disp: 1 mL, Rfl: 11 .  ondansetron (ZOFRAN) 4 MG tablet, SMARTSIG:2 By Mouth Twice Daily PRN, Disp: , Rfl:  .  topiramate (TOPAMAX) 50 MG tablet, TAKE 1 TABLET(50 MG) BY MOUTH TWICE DAILY (Patient taking differently: Take 50 mg by mouth 2 (two) times daily.), Disp: 60 tablet, Rfl: 5 .  Vitamin  D, Ergocalciferol, (DRISDOL) 1.25 MG (50000 UNIT) CAPS capsule, Take 50,000 Units by mouth every 7 (seven) days., Disp: , Rfl:   Current Facility-Administered Medications:  [name]  Mepolizumab SOLR 100 mg, 100 mg, Subcutaneous, Q28 days, Valentina Shaggy, MD, 100 mg at 02/16/19 1018  Facility-Administered Medications Ordered in Other Visits:  .  gadopentetate dimeglumine (MAGNEVIST) injection 20 mL, 20 mL, Intravenous, Once PRN, Dohmeier, Asencion Partridge, MD  Allergies as of 01/30/2021 - Review Complete 01/30/2021  Allergen Reaction Noted  . Flonase [fluticasone] Shortness Of Breath 06/10/2017  . Sulfa antibiotics  12/10/2020  . Molds & smuts Other (See Comments) 03/09/2018  . Sulfites Itching and Swelling 02/04/2018  . Tetracyclines & related Other (See Comments) 03/09/2018  . Lactose intolerance (gi) Other (See Comments) 01/05/2021  . White birch Cough 04/10/2020    1. Work and Family: He can no longer work part-time at home. 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  2. Activities: He walks every day, but is in pain.  3. Smoking, alcohol, or drugs: He has not been drinking any alcohol or beer. No tobacco or drugs. 4. Primary Care Provider: His PCP is Ms Mattie Marlin, PA, [facility].  5. Neurology: Dr.  Brett Fairy was his neurologist, but now he goes to [facility].  6. Ophthalmology:  7. Retinology: Dr. Zadie Rhine 8. Urology: Dr. Jeffie Pollock at [facility] 9. Sports medicine: Dr. Oneida Alar 10. Therapy: Ms Rea College, RN, MSN  REVIEW OF SYSTEMS: There are no other significant problems involving Kirke's other body systems.   Objective:  Vital Signs:  Pulse (!) 108   Wt 211 lb 6.4 oz (95.9 kg)   BMI 29.48 kg/m  BP 112/82   Ht Readings from Last 3 Encounters:  01/30/21 '5\' 11"'  (1.803 m)  01/28/21 '5\' 10"'  (1.778 m)  01/25/21 5' 10.5" (1.791 m)   Wt Readings from Last 3 Encounters:  01/30/21 211 lb 6.4 oz (95.9 kg)  01/30/21 212 lb (96.2 kg)  01/28/21 213 lb (96.6 kg)    PHYSICAL EXAM:  Constitutional: The patient appears overweight, tired, weak, and sick. He has gained 12.8 ounces since 01/01/21. His affect is subdued and depressed. His insight is fair.   Eyes: There is no arcus or proptosis.  Mouth: The oropharynx appears normal. The tongue appears normal. There is normal oral moisture. There is no obvious gingivitis. There is no oral hyperpigmentation.  Neck: There are no bruits present. The thyroid gland appears enlarged. The gland is enlarged at about 22 grams in size. The consistency of the thyroid gland is normal. There is no thyroid tenderness to palpation. Lungs: The lungs are clear. Air movement is good. Heart: The heart rhythm and rate appear normal. Heart sounds S1 and S2 are normal. I do not appreciate any pathologic heart murmurs. Abdomen: The abdominal size is enlarged. Bowel sounds are normal. The abdomen is soft. There is tenderness to palpation around his umbilicus. There is no obviously palpable hepatomegaly, splenomegaly, or other masses.  Arms: Muscle mass appears appropriate for age.  Hands: There is no obvious tremor. Phalangeal and metacarpophalangeal joints appear normal. Palms are normal. There is no palmar hyperpigmentation.  Legs: Muscle mass appears appropriate for  age. There is no edema.  Feet: There are no significant deformities. Dorsalis pedis pulses are normal 2+ bilaterally.  Neurologic: Muscle strength is normal for age and gender  in both the upper and the lower extremities. Muscle tone appears normal. Sensation to touch is normal  in the legs and feet. Skin: Thre is no significant hyperpigmentation.  LAB DATA:   Labs 01/30/21: HbA1c 6.3%, CBG 163; Urine glucose negative, ketones negative  Labs 12/25/20: CBG 199   Labs 2/18-2/19/22: CBG 285; CBC normal; U/A >500 glucose, negative ketones; BMP normal, except CO2 20 and glucose 294; CK 77 (ref 49-397); hepatic panel normal  Labs 12/05/20: HbA1c 7.5%, CBG 165; BHOB  0.2 (ref 0.04-0.27); venous pH 7.284;   Labs 08/16/20: HbA1c 6.2%, CBG 215  Labs 05/14/20: HbA1c 6.5%, CBG 176; TSH 2.44, free T4 1.1, free T3 3.5; urinary microalbumin/creatinine ratio 2 (ref <30)  Labs 04/17/20: ESR 6 (ref 0-15), CK 82 (ref 49-439)  Labs 07/12/19: HbA1c 6.4%, CBG 231  Labs 04/26/19: HbA1c 6.8%, CBG 230  Labs 12/30/18: TSH 2.07, free T4 1.1, free T3 3.2, TPO antibody <1, thyroglobulin antibody <1; CMP normal, except glucose 191; cholesterol 165, triglycerides 138, HDL 33, LDL 106; urinary microalbumin/creatinine ratio 5; vitamin B1 10 (ref 8-30), vitamin B6 3.4 (ref 2.1-21.7), vitamin B12 451 (ref [id number])  Labs 11/29/18: CBG 179  Labs 11/29/18: CMP with glucose 198, chloride 109 (ref 96-106, CO2 17 (ref 20-29); CBC with WBC 11.6 (ref 3.4-10.8), PMNs 8.6 (ref 1.4-7.0), and monocytes 1.0 (ref 0.1-0.9)  Labs 10/28/18: HbA1c 6.9%, CBG 155  Labs 10/06/18: ANA titer negative, ANCA titers negative, rheumatoid factor negative, cyclic citrullin peptide 7 (ref 0-190  Labs 07/27/18: CBG 193  Labs 05/31/18: HbA1c 6.6%, CBG 202  Labs 03/18/18: CBG 178  Labs 01/13/18: HbA1c 6.8%, CBG 194; TSH 0.98, free T4 1.3; free T3 3.6; CMP normal except glucose 191; urinary microalbumin/creatinine ratio 35 (ref <30)  Labs 11/17/17: CBG  170  Labs 10/13/17: HbA1c 6.8%, CBG 191  Labs 08/24/17: CBG 117  Labs 07/20/17: CBG 216  Labs 07/08/17: IgA, IgG, and IgM normal. CBC normal.  Labs 06/16/17: HbA1c 6.5%, CBG 193  Labs 05/15/17: CBG 209; TSH 0.90, free T4 1.2, free T3 3.8  Labs 03/05/17: HbA1c 7.3%, CBG 241  Labs 02/20/17: CBG 180 fasting  Labs 01/29/17: CBG 148  Labs 01/12/17: CBG 116  Labs 12/29/16: HbA1c 9.1%, CBG 223; TSH 0.62, free T4 1.1, free T3 3.5; urine microalbumin/creatinine ratio 22; cholesterol 171, triglycerides 93, HDL 40, LDL 112;  CMP glucose 201  Labs 09/26/16: HbA1c 9.5%  Labs 08/13/16: Celiac panel negative; CMP normal except for glucose 207; CBC normal  Labs 06/13/16: HbA1c 9.8%; LH 3.6, FSH 2.3, testosterone 533 (ref 250-827), free testosterone 59.4 (ref 47-244)  Labs 02/11/16: HbA1c 9.2%  Labs 01/07/16: HbA1c 10.5%; CBC with Hgb elevated at 17.2%; LH 3.9, FSH 2.0; TSH 1.53, free T4 1.3, free T3 3.7; cholesterol 262, triglycerides 171, HDL 40, LDL 188; urinary microalbumin/creatinine ratio 31; CMP normal except for glucose 181  Labs 06/29/15. HbA1c 9.6%  Labs 12/28/14: Hemoglobin A1c 9.5% today, compared with 9.5%, at last visit and with 9.9% at the prior visit.  Labs 08/03/14: HbA1c 9.5%; TSH 0.548,free T4 1.10, free T3 3.7, TPO antibody < 1, TSI 30; CMP normal except glucose 191; urinary microalbumin/creatinine ratio 27; cholesterol 260, triglycerides 90, HDL 51, LDL 191   Labs 10/05/13: HbA1c 9.9%  Labs 04/23/13: CMP normal, except glucose 144; cholesterol 216, triglycerides 97, HDL 39, LDL 158; urinary microalbumin/creatinine ratio 6.9; TSH 0.505, free T4 1.17, free T3 3.8  Labs: 01/21/12: TSH 1.251, free T4 1.10, free T3 3.6, CBC normal, CMP normal except for glucose 206, urinary microalbumin/creatinine ratio 6.3, cholesterol 187, triglycerides 67, HDL 45, LDL 129   Assessment and  Plan:   ASSESSMENT:  1. T1DM:   A. In 2021, his SGs had been mostly <200, reflecting fairly good DM control,  but at the cost of severely curtailing his food intake. [name] had done very well at trying to optimize his BG control. This level of BG control without significant hypoglycemia in October 2021 was a testament to the power of using auto mode in insulin pump-CGM technology and to [name]'s determination to keep his BGs under control.  B. Unfortunately, thereafter he had many site problems and sensor problems. BGs and HbA1c were much higher in February 2022. His HbA1c increased to 7.5%%, compared to 6.2% in October 2021. He was also not having too many low BGs in October.  C. Today, however, the HbA1c has decreased back to 6.3% and he is having more lower BGs, despite severely reducing the amount of insulin he takes daily. He has had more difficulty keeping his BGs from dropping low. . 2. Hypoglycemia:   A. He has not had any SGs <70, due mostly to him eating more and not bolusing.   B. The differential diagnosis here is complex, but includes:   1). Inadequate intake or absorption of carbohydrates.   2). Excessive renal of GI losses of glucose.    2). Excess utilization of glucose, which is unlikely due to his limited physical activity.   3). Excess insulin, due to an insulinoma or beta cell hyperplasia that could be located anywhere in he neuroendocrine tissue of the pancreas.    4). Liver pathology that would interfere with glycogen storage, glycogenolysis, or gluconeogenesis   5). Loss of epinephrine, norepinephrine, glucagon, or cortisol, due either to inadequate ACTH or to Addison's disease.  3. Hypertension: His BP is normal today.  4. Combined hyperlipidemia:   A. His lipids in March 2017 were too high, but the lipids in March 2018 were much better on atorvastatin. He discontinued the atorvastatin in mid-2018 due to concern that this medication might be causing his neuromuscular symptoms.   B. Ms Frederico Hamman re-started his atorvastatin several months ago. His lipids in March 2022 were worse.  5.  Autonomic neuropathy with tachycardia and gastroparesis: His gastroparesis has improved in parallel with his BG improvement. His heart rate has improved as well. .  6. Obesity: Weight has increased slightly. He is trying to eat more. He has also probably lost some muscle.    7-8. Goiter/thyroiditis:   A. His thyroid gland had shrunk back to normal size at a previous visit, but had increased slightly at his last visit and remains slightly enlarged today. His thyroiditis is clinically quiescent.   B. The active thyroiditis that he has had in the past and the  process of waxing and waning of thyroid gland size are c/w evolving Hashimoto's thyroiditis.   C. He was euthyroid in March 2013, but was borderline hyperthyroid in June 2014. He was euthyroid in October 2015, March 2017, March 2018, July 2018, in March 2019, in March 2020, and in July 2021.  9. Fatigue: This problem is much worse today..   10. Obstructive sleep apnea: He did not meet the criteria for OSA, but because of his snoring a dental appliance was suggested. I recommended that he obtain a dental appliance. Unfortunately, his former dentist no longer accepts [employer], so [name] has to find a new dentist. 40. Adult ADD: He had an appointment with Dr. Carlena Hurl, MD in [place] in November 2018. Dr. Wylene Simmer is waiting for [name]'s medical situation  to stabilize before beginning any new psych meds.   12. Proliferative diabetic retinopathy: As above.  13. URI/nasal congestion/vertigo/allergic rhinitis and conjunctivitis: He is not having these problems today. The monthly injections have really helped him.   14. Neuromuscular symptoms and headaches/increased intracranial pressure:   A. It appeared previously that some of [name]'s headaches were classic tension headaches. When he performed cervical stretching exercises the headache resolved.   B. In the past when Dr. Brett Fairy evaluated [name] for his vague neuromuscular symptoms, she  did not find any signs of any serious neurologic disease. His MRI of his brain was unremarkable. At that time his neuromuscular symptoms had resolved.  C. Later, however, the headaches worsened and some of the neuromuscular symptoms recurred. We now know that he had increased ICP and that his headaches improved for a brief time after the LP. He then developed a typical spinal tap headache.  D. In retrospect he may have had intermittent problems with elevated ICP previously.  E. Dr. Brett Fairy suspects that [name]'s ICP was due to the doxycycline.  F. He appeared to have problems taking his prior dose of Diamox, but is doing better on the combination of a lower dose of Diamox and Topamax.   G. He is being evaluated at [facility] neurology now.  15. Calcified lymph node: It is possible that he may have contracted a fungus while he was working in the [employer]. several years ago. His thyroid US showed his goiter, but no intrinsic thyroid abnormality. A partially calcified cervical lymph node on the right was noted, c/w a post infections/inflammatory node. 16. Microalbuminuria: His ratio was mildly elevated in March 2019. He needed tighter BG and BP control. His ratio in March 2020 and again in July 2021 was very good.  17. Anhedonia: [name] is more physically and emotionally depressed today. He desperately wants to be normal. His sessions with Ms Rea College, RN, MSN, a psychiatric nurse specialist, have been very beneficial. He still needs to find more sources of fun.  18. Neck pain/Cervical neuritis/neuromuscular pain: [name] has seen Dr. Oneida Alar several times. Dr. Oneida Alar had planned to obtain a neck xray if [name]'s neck did not improve. Unfortunately, [name] missed his lat appointment with Dr. Oneida Alar due to conflicting medical appointment.  I asked [name] to re-schedule his appointment with Dr. Oneida Alar.   19. Cerebral arteriosclerosis: It is unclear to me whether the lesions seen on angiography and MRA are amenable  to vascular surgical techniques. I put in a referral today to vascular surgery for a consultation.   PLAN:  1. Diagnostic: I ordered several lab tests today that should have been drawn last week:  TFTs, ACTH, cortisol, TTG IgA, and IgA/  2. Therapeutic: I agree with Dr. Barbaraann Barthel that [name] needs to be admitted. I called Dr. Ella Jubilee, the admitting resident on the internal medicine teaching service. She graciously agreed to admit [name] for further evaluation and management. I suggest continuing his current pump settings for now, but make whatever new adjustments seem reasonable. I suggest continuing his losartan, ASA, and atorvastatin.   Basal rates: basal 1 MN:0.99 6 AM: 0.975 2 PM: 1.35 8 PM: 1.30   ISFs: 35   ICRs: MN: 7 8 AM: 4.5 11 AM: 4.5 9 PM: 7   BG targets:  MN: 130-150 6 AM: 120 10 PM 130-150  3. Patient education:   A. We discussed all of the above at great length. We also discussed the need for him to continue to be admitted  for further evaluation.  BChester [name] is at home now, awaiting a call from Patient Placement for him to come in for admission. 4. Follow-up:  I will be leaving town tomorrow at noon time for a planned trip, but will return on Monday, 02/04/21. I can be reached on my cell phone: [phone]. Please feel free to contact me at any time.   Level of Service: This visit lasted in excess of 100 minutes. More than 50% of the visit was devoted to coordinating care and documenting this encounter.    Tillman Sers, MD, CDE Adult and Pediatric Endocrinology

## 2021-01-30 NOTE — Progress Notes (Signed)
Direct admit to internal medicine teaching service. Please page [phone] once patient arrives.   Lenward Chancellor D, DO 01/30/21, 1:39 PM

## 2021-01-30 NOTE — Progress Notes (Signed)
PCP: [name], PA-C  Subjective:   HPI: Patient is a 40 y.o. male here for hand and foot pain.  3/9: Patient reports over the past 2 months he has felt increasingly weak. Primary complaint today is feeling like his feet and hands are 'down to the bone' and it feels uncomfortable when holding the steering wheel, walking. Feels at times like he cannot hold his body weight. Of note he's had difficulty with his insulin pump in getting sites to work (better past 5 days). Relatedly he's lost about 10+ pounds over the past 1-2 months. Reports large caloric deficit taking in 800-900 calories a day over past couple months. Denies numbness or tingling in feet or hands. Microfilament testing has been normal per endo notes. He is also scheduled to see neurologist tomorrow. Has regular appointments with diabetes educator but not dietitian. Denies nausea or vomiting though appetite is not what it has been. Autonomic dysfunction noted in chart - asked him about gastroparesis and he reports history of this being mentioned remotely.  3/23: Patient reports he feels like he's worse than last visit. Biggest problem is recent stomach virus with nausea, diarrhea, abdominal pain - feels this has improved but was present for several days. Blood sugars not increasing with his carbohydrate intake - even had to turn off his basal rate because blood sugar was dropping. This morning it appears to be rising in response to diet, had no ketones this morning and basal is now back on. Stomach feels crampy. Has not seen GI in past. Started BRAT diet as recommended by Dr. Fransico Michael. Had initial visit to dietitian and doesn't follow up for about 3-4 more weeks - most discussion was general in the initial visit.  4/6: [name] comes in today very fatigued, run down. He reports he is not even running his insulin pump 90% of the time and maximum bolus he's given himself over more than a week was 1 unit of insulin. Blood  sugars still not responding to his dietary intake. He's consuming more than 2000 calories a day and has been tracking this regularly on an app. Eating variety of things but primarily eggs, sausage, bread, applesauce throughout the day. He is not sleeping because of concern about his blood sugars dropping overnight and not being surrounded by people who know how to take care of this if it were to happen. Takes more than an hour for blood sugar to stabilize when it starts to drop despite drinking sugar-containing beverages. He had EGD with Dr. Christella Hartigan after evaluation - some gastritis and biopsies sent to assess for celiac but otherwise normal test. Gastric emptying study was normal in 2019. Has a pancreatic MRI scheduled for Saturday. Reports he's not sure how long he can continue like this though denied suicidal ideation when asked directly and no plan.  Past Medical History:  Diagnosis Date  . ADHD (attention deficit hyperactivity disorder)   . Anxiety   . Asthma   . Autonomic neuropathy due to diabetes (HCC)   . Chronic headaches   . Diabetes (HCC)   . Fatigue   . GERD (gastroesophageal reflux disease)   . Goiter   . High blood pressure   . High cholesterol   . Hypercholesterolemia   . Hypertension   . Hypoglycemia associated with diabetes (HCC)   . Malnutrition (HCC)   . Obesity   . Sleep apnea    mild, no cpap  . Tachycardia   . Type 1 diabetes mellitus not at goal (  HCC)   . Uncontrolled DM with microalbuminuria or microproteinuria     Current Outpatient Medications on File Prior to Visit  Medication Sig Dispense Refill  . acetaminophen (TYLENOL) 500 MG tablet Take 1,000 mg by mouth 2 (two) times daily as needed for moderate pain.    [name]. acetaZOLAMIDE (DIAMOX) 250 MG tablet Take 250 mg by mouth daily. At night    . acetaZOLAMIDE (DIAMOX) 500 MG capsule TAKE 2 CAPSULES BY MOUTH EVERY MORNING AND ONE IN THE EVENING (Patient taking differently: Take 500 mg by mouth daily. At  noon) 90 capsule 1  . albuterol (PROAIR HFA) 108 (90 Base) MCG/ACT inhaler Inhale 2 puffs into the lungs every 6 (six) hours as needed for wheezing or shortness of breath. 1 Inhaler 2  . albuterol (PROVENTIL) (2.5 MG/3ML) 0.083% nebulizer solution Take 3 mLs (2.5 mg total) by nebulization every 4 (four) hours as needed for wheezing or shortness of breath. 75 mL 1  . ALPRAZolam (XANAX) 0.25 MG tablet Take 0.25 mg by mouth at bedtime.    [name]. aspirin EC 81 MG tablet Take 162 mg by mouth daily as needed for moderate pain. Swallow whole.    [name]. atorvastatin (LIPITOR) 20 MG tablet Take 20 mg by mouth daily.   0  . AUVI-Q 0.3 MG/0.3ML SOAJ injection Use as directed for life-threatening allergic reaction. (Patient taking differently: Inject into the muscle daily as needed for anaphylaxis. Use as directed for life-threatening allergic reaction.) 2 each 2  . budesonide (PULMICORT) 0.5 MG/2ML nebulizer solution Take 2 mLs (0.5 mg total) by nebulization as directed. (Patient taking differently: Take 0.5 mg by nebulization daily as needed (wheezing).) 2 mL 3  . budesonide-formoterol (SYMBICORT) 160-4.5 MCG/ACT inhaler Inhale 2 puffs into the lungs 2 (two) times daily. (Patient taking differently: Inhale 2 puffs into the lungs daily as needed (wheezing).) 1 Inhaler 5  . calcium carbonate (OSCAL) 1500 (600 Ca) MG TABS tablet Take 600 mg of elemental calcium by mouth daily.    . Continuous Blood Gluc Receiver (DEXCOM G6 RECEIVER) DEVI 1 Device by Does not apply route as directed. 1 each 2  . Glucagon (BAQSIMI TWO PACK) 3 MG/DOSE POWD Place 1 spray into the nose as directed. (Patient taking differently: Place 1 spray into the nose as directed. For low glucose emergency) 2 each 3  . glucose blood test strip Use to check blood sugar up to 10x daily 300 each 11  . HUMALOG 100 UNIT/ML injection INJECT 300 UNITS UNDER THE SKIN VIA INSULIN PUMP EVERY 48 TO 72 HOURS PER HYPERGLYCEMIA AND DKA PROTOCOLS (Patient taking differently:  Inject 30 Units into the skin continuous. Pump) 120 mL 5  . Insulin Human (INSULIN PUMP) SOLN Inject into the skin.    [name]. insulin lispro (INSULIN LISPRO) 100 UNIT/ML KwikPen Junior Inject up to 50 units daily per provider instructions (Patient taking differently: Inject into the skin See admin instructions. Inject up to 50 units daily per provider instructions ( Back up )) 15 mL 11  . Insulin Pen Needle (PEN NEEDLES) 32G X 4 MM MISC Use to inject insulin 6x per day 200 each 5  . levocetirizine (XYZAL) 5 MG tablet Take 1 tablet (5 mg total) by mouth daily as needed for allergies. 30 tablet 5  . losartan (COZAAR) 50 MG tablet TAKE 1 TABLET(50 MG) BY MOUTH DAILY (Patient taking differently: Take 50 mg by mouth daily.) 90 tablet 1  . Mometasone Furoate (ASMANEX HFA) 200 MCG/ACT AERO For asthma flare, begin Asmanex  200-2 puffs twice a day for 1-2 weeks or until cough and wheeze free (Patient taking differently: Inhale 2 puffs into the lungs daily as needed (Asthma).) 13 g 5  . montelukast (SINGULAIR) 10 MG tablet TAKE 1 TABLET(10 MG) BY MOUTH AT BEDTIME 30 tablet 0  . Multiple Vitamin (MULTIVITAMIN WITH MINERALS) TABS tablet Take 1 tablet by mouth daily.    [name] NUCALA 100 MG/ML SOAJ INJECT 100MG  SUBCUTANEOUSLY EVERY 30 DAYS (GIVEN AT MD  OFFICE) (Patient taking differently: Inject 100 mg into the skin every 30 (thirty) days.) 1 mL 11  . ondansetron (ZOFRAN) 4 MG tablet SMARTSIG:2 By Mouth Twice Daily PRN    . topiramate (TOPAMAX) 50 MG tablet TAKE 1 TABLET(50 MG) BY MOUTH TWICE DAILY (Patient taking differently: Take 50 mg by mouth 2 (two) times daily.) 60 tablet 5  . Vitamin D, Ergocalciferol, (DRISDOL) 1.25 MG (50000 UNIT) CAPS capsule Take 50,000 Units by mouth every 7 (seven) days.    . [DISCONTINUED] omeprazole (PRILOSEC) 20 MG capsule Take 20 mg by mouth daily as needed (for reflux).  (Patient not taking: No sig reported)     Current Facility-Administered Medications on File Prior to Visit  Medication  Dose Route Frequency Provider Last Rate Last Admin  . gadopentetate dimeglumine (MAGNEVIST) injection 20 mL  20 mL Intravenous Once PRN Dohmeier, , MD      . Mepolizumab SOLR 100 mg  100 mg Subcutaneous Q28 days Porfirio Mylar, MD   100 mg at 02/16/19 1018    Past Surgical History:  Procedure Laterality Date  . EYE SURGERY Right 05/30/2020   Vitrectomy, Dr. 07/30/2020  . EYE SURGERY  04/2020  . IR 3D INDEPENDENT WKST  07/16/2020  . IR ANGIO INTRA EXTRACRAN SEL COM CAROTID INNOMINATE BILAT MOD SED  07/16/2020  . IR ANGIO VERTEBRAL SEL SUBCLAVIAN INNOMINATE UNI L MOD SED  07/16/2020  . IR ANGIO VERTEBRAL SEL VERTEBRAL UNI R MOD SED  07/16/2020  . IR 07/18/2020 GUIDE VASC ACCESS RIGHT  07/16/2020  . REFRACTIVE SURGERY     x9  . WISDOM TOOTH EXTRACTION      Allergies  Allergen Reactions  . Flonase [Fluticasone] Shortness Of Breath  . Sulfa Antibiotics     Shortness of breath, increased heart rate; "happened quite a while ago"  . Molds & Smuts Other (See Comments)    Aggravate asthma  . Sulfites Itching and Swelling  . Tetracyclines & Related Other (See Comments)    increased intracranial pressure  . Lactose Intolerance (Gi) Other (See Comments)    Upset stomach  . [place] Cough    Social History   Socioeconomic History  . Marital status: Single    Spouse name: Not on file  . Number of children: 0  . Years of education: Not on file  . Highest education level: Not on file  Occupational History  . Occupation: 07/18/2020  Tobacco Use  . Smoking status: Never Smoker  . Smokeless tobacco: Never Used  Vaping Use  . Vaping Use: Never used  Substance and Sexual Activity  . Alcohol use: No  . Drug use: No  . Sexual activity: Not on file  Other Topics Concern  . Not on file  Social History Narrative   ** Merged History Encounter **       Social Determinants of Health   Financial Resource Strain: Not on file  Food Insecurity: Not on file  Transportation  Needs: Not on file  Physical Activity: Not on file  Stress: Not on file  Social Connections: Not on file  Intimate Partner Violence: Not on file    Family History  Problem Relation Age of Onset  . Dementia Paternal Aunt   . Schizophrenia Paternal Aunt   . Cancer Maternal Grandmother        type unknown  . Diabetes Maternal Grandmother        T2 DM  . Cancer Maternal Grandfather        type unknown  . Irritable bowel syndrome Mother   . Heart disease Father   . Hypertension Father   . Hyperlipidemia Father   . Thyroid disease Sister   . Stomach cancer Paternal Aunt   . Pancreatic cancer Paternal Aunt   . Diabetes Maternal Aunt   . Allergic rhinitis Neg Hx   . Angioedema Neg Hx   . Asthma Neg Hx   . Atopy Neg Hx   . Eczema Neg Hx   . Immunodeficiency Neg Hx   . Urticaria Neg Hx   . Esophageal cancer Neg Hx   . Colon cancer Neg Hx   . Colon polyps Neg Hx     BP 122/72   Ht 5\' 11"  (1.803 m)   Wt 212 lb (96.2 kg)   BMI 29.57 kg/m   [facility] Exercise 09/11/2020  Frequency of aerobic exercise (# of days/week) 5  Average time in minutes 30  Frequency of strengthening activities (# of days/week) 0    No flowsheet data found.  Review of Systems: See HPI above.     Objective:  Physical Exam:  Gen: Slumped, appears fatigued, decreased affect.   Assessment & Plan:  1. Hypoglycemia, Type 1 DM - I'm very concerned about [name] at this point.  He continues to worsen, pump is off almost all the time with minimal boluses (max 1 unit) despite 2000+ calorie intake.  He is not sleeping, very fatigued and worn down.  I called Dr. 09/13/2020 office and relayed my concerns.  He has a phone call with Dr. Juluis Mire at 11:15 and they will follow up with me after that.  I will also call [name] after this.  I believe it would be best for him to be in the hospital at this point to have this evaluated further and managed.  I do not have hospital privileges and his PCP is in  the [place] system.  Dr. [place] had ordered some outpatient labs but only a lipid panel was drawn up when he had these done.  He denies suicidal ideation and is safe at this time.    Total visit time 30 minutes.

## 2021-01-31 ENCOUNTER — Observation Stay (HOSPITAL_COMMUNITY): Payer: 59

## 2021-01-31 ENCOUNTER — Telehealth: Payer: Self-pay

## 2021-01-31 DIAGNOSIS — E10649 Type 1 diabetes mellitus with hypoglycemia without coma: Principal | ICD-10-CM

## 2021-01-31 DIAGNOSIS — Z794 Long term (current) use of insulin: Secondary | ICD-10-CM

## 2021-01-31 DIAGNOSIS — G932 Benign intracranial hypertension: Secondary | ICD-10-CM

## 2021-01-31 DIAGNOSIS — E785 Hyperlipidemia, unspecified: Secondary | ICD-10-CM

## 2021-01-31 DIAGNOSIS — E10319 Type 1 diabetes mellitus with unspecified diabetic retinopathy without macular edema: Secondary | ICD-10-CM | POA: Diagnosis not present

## 2021-01-31 DIAGNOSIS — E1043 Type 1 diabetes mellitus with diabetic autonomic (poly)neuropathy: Secondary | ICD-10-CM | POA: Diagnosis not present

## 2021-01-31 DIAGNOSIS — I10 Essential (primary) hypertension: Secondary | ICD-10-CM | POA: Diagnosis not present

## 2021-01-31 LAB — GLUCOSE, CAPILLARY
Glucose-Capillary: 215 mg/dL — ABNORMAL HIGH (ref 70–99)
Glucose-Capillary: 216 mg/dL — ABNORMAL HIGH (ref 70–99)
Glucose-Capillary: 225 mg/dL — ABNORMAL HIGH (ref 70–99)
Glucose-Capillary: 209 mg/dL — ABNORMAL HIGH (ref 70–99)
Glucose-Capillary: 251 mg/dL — ABNORMAL HIGH (ref 70–99)

## 2021-01-31 LAB — CORTISOL-AM, BLOOD: Cortisol - AM: 6.5 ug/dL — ABNORMAL LOW (ref 6.7–22.6)

## 2021-01-31 MED ORDER — GADOBUTROL 1 MMOL/ML IV SOLN
9.5000 mL | Freq: Once | INTRAVENOUS | Status: AC | PRN
  Administered 2021-01-31: 9.5 mL via INTRAVENOUS

## 2021-01-31 NOTE — Progress Notes (Signed)
Inpatient Diabetes Program Recommendations  AACE/ADA: New Consensus Statement on Inpatient Glycemic Control (2015)  Target Ranges:  Prepandial:   less than 140 mg/dL      Peak postprandial:   less than 180 mg/dL (1-2 hours)      Critically ill patients:  140 - 180 mg/dL   Lab Results  Component Value Date   GLUCAP 209 (H) 01/31/2021   HGBA1C 6.3 (A) 01/30/2021    Review of Glycemic Control  Diabetes history: DM type 1, Sees Dr. Fransico Michael last visit on 4/6 see note for differential diagnosis and plan with labs that were ordered outpatient  Outpatient Diabetes medications:  Medtronic 670 G Guardian 3 sensor Basal rates: basal 1 12AM:0.99  6 AM: 0.975  2 PM: 1.35  8 PM: 1.30  Pt is on a reduced basal rate that give him 51.6 units in a 24 hour period instead of his baseline rate of 67.2 units in a 24 hour period              Insulin Sensitivity Factor: 1 unit drops glucose 35 points              ICRs: (insulin to carb ratio, 1 unit for every so many carbs) 12 AM: 7   8 AM: 4.5 11 AM: 4.5   9 PM: 7              BG targets:  12 AM: 130-150   6 AM: 120 10 PM: 130-150  Insulin pump insertion site right thigh site changed 4/7 at 0300 next time due in 3 days on 4/10 Right arm CGM Guardian site changed 4/7 at 0300 next time due  In 7 days on 4/14 Left arm CGM Dexcom  Site changed 4/7 0300 next time due in 10 days on 4/17  All insertion sites WNL without redness. Pt has extra pump and CGM supplies at bedside.  Current orders for Inpatient glycemic control: insulin pump order set   Inpatient Diabetes Program Recommendations:     MD-  Allow pt to use CGM glucose readings while in the hospital by using "may use CGM in hospital". Verify glucose with fingerstick once a day and if elevated above 300 or if you think pt is having hypoglycemia.  RN- wrench in CGM flow sheet to document glucose readings Use insulin pump flow sheet to document insulin pump site Qshift   We do not  want tight glycemic control in this case as his body does not seem like it is absorbing carbs and responding to insulin as it did at his baseline. Its ok for pt to be 70-250 for now. We will not make adjustments to his pump at this time. Recommend leaving insulin pump on. Will follow patient everyday and stay in close contact with pt. Discussed with pt its ok to walk the hallways.  Suggested to pt to go up on his basal insulin rate to 2.2 (from 2.15) to give slightly more basal to see if his overall trends come down (baseline rate is 2.8).  Review Dr. Juluis Mire note for further information on differential diagnosis and pts visit from yesterday on 4/6. Spoke with Dr. Imogene Burn on patient plan of care.  Should have some lab test back tomorrow.  If testing negative would recommend pt watching trends at home and make an appointment with Dr. Fransico Michael next week for follow up.  Thanks,  Christena Deem RN, MSN, BC-ADM Inpatient Diabetes Coordinator Team Pager [phone] (8a-5p)

## 2021-01-31 NOTE — Telephone Encounter (Signed)
-----   Message from Rachael Fee, MD sent at 01/31/2021  2:13 PM EDT ----- Regarding: RE: MRI Yes, he does not need another MR  Thanks  ----- Message ----- From: Lamona Curl, CMA Sent: 01/31/2021   1:38 PM EDT To: Rachael Fee, MD Subject: MRI                                            Patient is currently admitted to the hospital.  You ordered MRI abd with and without contrast when he was seen in the office.  They did MRI at hospital this am.  Is it OK if I cancel MRI that is scheduled?  Precert team is asking me.  Thank you,  Joni Reining

## 2021-01-31 NOTE — Progress Notes (Signed)
   Subjective:   Patient states that he feels fairly well. Notes poor sleep last night due to frequency interruptions. Feeling tired, but otherwise no particular complaints.  His insulin pump had to be removed for the MRI scan and requires 12 hours to calibrate the auto mode, so is currently just giving a basal regimen. He notes the sensor is not working great so he may have to change it again, requiring another 12 hour window.  Objective:  Vital signs in last 24 hours: Vitals:   01/30/21 1659 01/30/21 2310 01/31/21 0457  BP: 137/79 126/84 113/67  Pulse: (!) 102 (!) 101 83  Resp: 18 19 18   Temp: 98.3 F (36.8 C) 97.7 F (36.5 C) 97.7 F (36.5 C)  SpO2: 99% 98% 99%  Weight: 96.1 kg    Height: 5\' 11"  (1.803 m)      Physical Exam Constitutional: no acute distress, ambulating without difficulty Head: atraumatic ENT: external ears normal Eyes: EOMI Cardiovascular: regular rate and rhythm, normal heart sounds Pulmonary: effort normal, lungs clear to ascultation bilaterally Abdominal: flat Skin: warm and dry Neurological: alert, no focal deficit Psychiatric: anxious  Assessment/Plan: [name] is a 40 y.o. male with hx of type 1 diabetes with autonomic neuropathy, HLD, obesity, HTN, idiopathic intracranial hypertension, hypothyroidism presenting for low blood sugar over the past 3 weeks.    Active Problems:   Hypoglycemia  Hypoglycemia DM with autonomic neuropathy and diabetic retinopathy Concerned with fast downtrends in blood sugar, but no actual hypoglycemic episodes on his insulin pump log, which he attributes to his active management. BG 200s-300s here. AM cortisol mildly low at 6.5. MRI of abdomen without any focal mass, chronically atrophic pancreas -Follow-up ACTH, C-peptide, proinsulin/insulin ratio, TTG, fecal elastase -q4hr blood sugars, discussed with nursing to alert [name] for blood sugar less than 90, will ask nursing to monitor his remote BG sensor  overnight -Continue home insulin pump  Idiopathic intracranial hypertension Followed by neurology. -Continue home Diamox and Topamax  HTN -Continue Home losartan 50mg  daily.   HLD -Continue home atorvastatin 20 mg   Diet:  Carb modified IVF:  none VTE:  lovenox Prior to Admission Living Arrangement:  home Anticipated Discharge Location:  home Barriers to Discharge:  Medical workup Dispo: Anticipated discharge in approximately 1-2 day(s).   24, MD 01/31/2021, 7:19 AM Pager: [phone] After 5pm on weekdays and 1pm on weekends: On Call pager [phone]

## 2021-01-31 NOTE — Telephone Encounter (Signed)
MRI abdomen was canceled.

## 2021-01-31 NOTE — Progress Notes (Signed)
Patient bolus 0.1 for his CBG 251.

## 2021-01-31 NOTE — Plan of Care (Signed)
  Problem: Education: Goal: Knowledge of General Education information will improve Description: Including pain rating scale, medication(s)/side effects and non-pharmacologic comfort measures Outcome: Completed/Met   Problem: Health Behavior/Discharge Planning: Goal: Ability to manage health-related needs will improve Outcome: Completed/Met   Problem: Clinical Measurements: Goal: Will remain free from infection Outcome: Completed/Met Goal: Diagnostic test results will improve Outcome: Completed/Met   

## 2021-02-01 DIAGNOSIS — E1043 Type 1 diabetes mellitus with diabetic autonomic (poly)neuropathy: Secondary | ICD-10-CM | POA: Diagnosis not present

## 2021-02-01 DIAGNOSIS — I10 Essential (primary) hypertension: Secondary | ICD-10-CM | POA: Diagnosis not present

## 2021-02-01 DIAGNOSIS — E10649 Type 1 diabetes mellitus with hypoglycemia without coma: Secondary | ICD-10-CM | POA: Diagnosis not present

## 2021-02-01 DIAGNOSIS — E10319 Type 1 diabetes mellitus with unspecified diabetic retinopathy without macular edema: Secondary | ICD-10-CM | POA: Diagnosis not present

## 2021-02-01 LAB — AMYLASE: Amylase: 23 U/L — ABNORMAL LOW (ref 28–100)

## 2021-02-01 LAB — ACTH STIMULATION, 3 TIME POINTS
Cortisol, 30 Min: 21.6 ug/dL
Cortisol, 60 Min: 24.6 ug/dL
Cortisol, Base: 8.9 ug/dL

## 2021-02-01 LAB — LIPASE, BLOOD: Lipase: 22 U/L (ref 11–51)

## 2021-02-01 LAB — ACTH: C206 ACTH: 29.9 pg/mL (ref 7.2–63.3)

## 2021-02-01 LAB — C-PEPTIDE: C-Peptide: <0.1 ng/mL — ABNORMAL LOW (ref 1.1–4.4)

## 2021-02-01 MED ORDER — INSULIN ASPART 100 UNIT/ML ~~LOC~~ SOLN: 0.0000 [IU] | Freq: Three times a day (TID) | SUBCUTANEOUS | Status: DC

## 2021-02-01 MED ORDER — COSYNTROPIN 0.25 MG IJ SOLR
0.2500 mg | Freq: Once | INTRAMUSCULAR | Status: AC
  Administered 2021-02-01: 0.25 mg via INTRAVENOUS
  Filled 2021-02-01: qty 0.25

## 2021-02-01 MED ORDER — INSULIN ASPART 100 UNIT/ML ~~LOC~~ SOLN
0.0000 [IU] | Freq: Three times a day (TID) | SUBCUTANEOUS | Status: DC
Start: 2021-02-02 — End: 2021-02-02

## 2021-02-01 MED ORDER — INSULIN GLARGINE 100 UNIT/ML ~~LOC~~ SOLN
36.0000 [IU] | Freq: Every day | SUBCUTANEOUS | Status: DC
  Filled 2021-02-01 (×2): qty 0.36

## 2021-02-01 NOTE — Progress Notes (Addendum)
Inpatient Diabetes Program Recommendations  AACE/ADA: New Consensus Statement on Inpatient Glycemic Control (2015)  Target Ranges:  Prepandial:   less than 140 mg/dL      Peak postprandial:   less than 180 mg/dL (1-2 hours)      Critically ill patients:  140 - 180 mg/dL   Lab Results  Component Value Date   GLUCAP 251 (H) 01/31/2021   HGBA1C 6.3 (A) 01/30/2021    Review of Glycemic Control  Diabetes history: DM type 1, Sees Dr. Fransico Michael last visit on 4/6 see note for differential diagnosis and plan with labs that were ordered outpatient  Outpatient Diabetes medications:  Medtronic 670 G Guardian 3 sensor Basal rates: basal 1 12AM:0.99  6 AM: 0.975  2 PM: 1.35  8 PM: 1.30  Pt is on a reduced basal rate that give him 51.6 units in a 24 hour period instead of his baseline rate of 67.2 units in a 24 hour period              Insulin Sensitivity Factor: 1 unit drops glucose 35 points              ICRs: (insulin to carb ratio, 1 unit for every so many carbs) 12 AM: 7   8 AM: 4.5 11 AM: 4.5   9 PM: 7              BG targets:  12 AM: 130-150   6 AM: 120 10 PM: 130-150  Insulin pump insertion site right thigh site changed 4/7 at 0300 next time due in 3 days on 4/10 Right arm CGM Guardian site changed 4/7 at 0300 next time due  In 7 days on 4/14 Left arm CGM Dexcom  Site changed 4/7 0300 next time due in 10 days on 4/17  All insertion sites WNL without redness. Pt has extra pump and CGM supplies at bedside.  Current orders for Inpatient glycemic control: insulin pump order set  Inpatient Diabetes Program Recommendations:     RN- wrench in CGM flow sheet to document glucose readings Use insulin pump flow sheet to document insulin pump site Qshift     We do not want tight glycemic control in this case as his body does not seem like it is absorbing carbs and responding to insulin as it did at his baseline. Its ok for pt to be 70-250 for now. We will not make adjustments  to his pump at this time. Recommend leaving insulin pump on. Will follow patient everyday and stay in close contact with pt. Discussed with pt its ok to walk the hallways.  Suggested to pt to go up on his basal insulin rate to 2.2 (from 2.15) to give slightly more basal to see if his overall trends come down (baseline rate is 2.8).  Review Dr. Juluis Mire note for further information on differential diagnosis and pts visit on 4/6. Spoke with Dr. Imogene Burn on patient plan of care.  From Dr. Juluis Mire note:   B. The differential diagnosis here is complex, but includes:                         1). Inadequate intake or absorption of carbohydrates.                         2). Excessive renal of GI losses of glucose.  2). Excess utilization of glucose, which is unlikely due to his limited physical activity.                         3). Excess insulin, due to an insulinoma or beta cell hyperplasia that could be located anywhere in the neuroendocrine tissue of the pancreas.                          4). Liver pathology that would interfere with glycogen storage, glycogenolysis, or gluconeogenesis                         5). Loss of epinephrine, norepinephrine, glucagon, or cortisol, due either to inadequate ACTH or to Addison's disease.   Follow up with Dr. Fransico Michael next week ASAP in office.  Addendum 1150am:  Rounded on pt this am. Glucose trends decreased overnight and pt suspended insulin pump for a few hours, consumed carbs during that time without fluctuations in glucose levels. Pt eventually restarted basal rate back at 1 and is now at 1.3. I will defer to pt in adjustments with his insulin pump he is very savvy in operating his pump and does not want tight control over glucose at this time. I agree with this management. Awaiting further test results.  Thanks,  Christena Deem RN, MSN, BC-ADM Inpatient Diabetes Coordinator Team Pager [phone] (8a-5p)

## 2021-02-01 NOTE — Progress Notes (Signed)
   Subjective:   [name] states he had some blood sugar drop last night to 150 despite eating multiple times. Due to this, he stopped the auto mode and had basal administered only. [name] states when he sees a down arrow, he tries to eat some to flatten the downtrend.   Objective:  Vital signs in last 24 hours: Vitals:   01/31/21 0948 01/31/21 1724 01/31/21 2103 02/01/21 0512  BP: 101/69 122/81 117/73 111/70  Pulse: 81 90 99 79  Resp: 18 18 18 18   Temp: 97.6 F (36.4 C) 97.6 F (36.4 C) 98 F (36.7 C) 97.8 F (36.6 C)  TempSrc: Oral Oral Oral Oral  SpO2: 98% 98% 99% 100%  Weight:      Height:        Physical Exam Constitutional: no acute distress, ambulating without difficulty Head: atraumatic ENT: external ears normal Eyes: EOMI Cardiovascular: regular rate and rhythm, normal heart sounds Pulmonary: effort normal, lungs clear to ascultation bilaterally Abdominal: flat Skin: warm and dry Neurological: alert, no focal deficit Psychiatric: anxious  Assessment/Plan: [name] is a 40 y.o. male with hx of type 1 diabetes with autonomic neuropathy, HLD, obesity, HTN, idiopathic intracranial hypertension, hypothyroidism presenting for low blood sugar over the past 3 weeks.    Active Problems:   Hypoglycemia  Hypoglycemia Type I DM with autonomic neuropathy and diabetic retinopathy Blood sugars elevated here, but patient concerned about rate of drop after administering insulin. Since he is managing his pump and eating food to prevent lows, unclear how his blood sugar would do if pump was set to auto, which is what he did previously.  AM cortisol mildly low at 6.5. MRI of abdomen without any focal mass, chronically atrophic pancreas. IgA and TTG negative. Thyroid function normal. C-peptide <0.1 consistent with type 1 DM. Spoke with his endocrinologist, Dr. 24, who agrees with workup thus far. -Follow-up ACTH, ACTH stimulation test, proinsulin/insulin  ratio -follow-up fecal elastase, amylase, lipase -q4hr blood sugars, discussed with nursing to alert [name] for blood sugar less than 90 -Continue home insulin pump -Will offer patient to change from insulin pump to basal and bolus pens. His endocrinologist, recommended the following regimen if so:     -basal lantus of 120% yesterday's total basal dose from his pump     -sliding scale novolog = 1 units per 50 points of blood sugar of 150 + 1 unit per 15g carbs  Idiopathic intracranial hypertension Followed by neurology. -Continue home Diamox and Topamax  HTN -Continue Home losartan 50mg  daily.   HLD -Continue home atorvastatin 20 mg   Diet:  Carb modified IVF:  none VTE:  lovenox Prior to Admission Living Arrangement:  home Anticipated Discharge Location:  home Barriers to Discharge:  Medical workup Dispo: Anticipated discharge in approximately 1-2 day(s).   [place], MD 02/01/2021, 6:35 AM Pager: [phone] After 5pm on weekdays and 1pm on weekends: On Call pager [phone]

## 2021-02-01 NOTE — Progress Notes (Signed)
CBG 171 at 0100 with a basal rate of 1.0  CBG 190 at 0400 with a basal rate of 1.0  No bolus was given. Patient is worried about getting him too much insulin.   He continues to write his caloric intake on the flow sheet in his room.

## 2021-02-02 ENCOUNTER — Ambulatory Visit (HOSPITAL_COMMUNITY): Payer: 59

## 2021-02-02 DIAGNOSIS — E10649 Type 1 diabetes mellitus with hypoglycemia without coma: Secondary | ICD-10-CM | POA: Diagnosis not present

## 2021-02-02 LAB — GLUCOSE, CAPILLARY
Glucose-Capillary: 106 mg/dL — ABNORMAL HIGH (ref 70–99)
Glucose-Capillary: 240 mg/dL — ABNORMAL HIGH (ref 70–99)
Glucose-Capillary: 287 mg/dL — ABNORMAL HIGH (ref 70–99)

## 2021-02-02 MED ORDER — PANCRELIPASE (LIP-PROT-AMYL) 36000-114000 UNITS PO CPEP: 36000.0000 [IU] | ORAL_CAPSULE | Freq: Three times a day (TID) | ORAL | 0 refills | Status: DC

## 2021-02-02 MED ORDER — PANCRELIPASE (LIP-PROT-AMYL) 36000-114000 UNITS PO CPEP
36000.0000 [IU] | ORAL_CAPSULE | Freq: Three times a day (TID) | ORAL | Status: DC
  Administered 2021-02-02: 36000 [IU] via ORAL
  Filled 2021-02-02: qty 1

## 2021-02-02 NOTE — Progress Notes (Signed)
Paged regarding orders for insulin and that patient has own pump.

## 2021-02-02 NOTE — Progress Notes (Signed)
DISCHARGE NOTE HOME [name] to be discharged Home per MD order. Discussed prescriptions and follow up appointments with the patient. Prescriptions given to patient; medication list explained in detail. Patient verbalized understanding.  Skin clean, dry and intact without evidence of skin break down, no evidence of skin tears noted. IV catheter discontinued intact. Site without signs and symptoms of complications. Dressing and pressure applied. Pt denies pain at the site currently. No complaints noted.  Patient free of lines, drains, and wounds.   An After Visit Summary (AVS) was printed and given to the patient. Patient escorted via wheelchair, and discharged home via private auto.  Velia Meyer, RN

## 2021-02-02 NOTE — Discharge Summary (Signed)
Name: [name] MRN: [id number] DOB: 11-Jul-1981 40 y.o. PCP: Lucila Maine  Date of Admission: 01/30/2021  4:12 PM Date of Discharge: 02/02/2021 Attending Physician: Reymundo Poll, MD  Discharge Diagnosis: 1. Hypoglycemia  Discharge Medications: Allergies as of 02/02/2021      Reactions   Flonase [fluticasone] Shortness Of Breath   Sulfa Antibiotics    Shortness of breath, increased heart rate; "happened quite a while ago"   Molds & Smuts Other (See Comments)   Aggravate asthma   Sulfites Itching, Swelling   Tetracyclines & Related Other (See Comments)   increased intracranial pressure   Lactose Intolerance (gi) Other (See Comments)   Upset stomach   [place] Cough      Medication List    TAKE these medications   acetaminophen 500 MG tablet Commonly known as: TYLENOL Take 1,000 mg by mouth 2 (two) times daily as needed for moderate pain.   acetaZOLAMIDE 250 MG tablet Commonly known as: DIAMOX Take 250 mg by mouth 2 (two) times daily. What changed: Another medication with the same name was removed. Continue taking this medication, and follow the directions you see here.   albuterol 108 (90 Base) MCG/ACT inhaler Commonly known as: ProAir HFA Inhale 2 puffs into the lungs every 6 (six) hours as needed for wheezing or shortness of breath.   albuterol (2.5 MG/3ML) 0.083% nebulizer solution Commonly known as: PROVENTIL Take 3 mLs (2.5 mg total) by nebulization every 4 (four) hours as needed for wheezing or shortness of breath.   ALPRAZolam 0.25 MG tablet Commonly known as: XANAX Take 0.25 mg by mouth at bedtime.   Asmanex HFA 200 MCG/ACT Aero Generic drug: Mometasone Furoate For asthma flare, begin Asmanex 200-2 puffs twice a day for 1-2 weeks or until cough and wheeze free What changed:   how much to take  how to take this  when to take this  reasons to take this  additional instructions   aspirin EC 81 MG tablet Take 162 mg by  mouth daily as needed for moderate pain. Swallow whole.   atorvastatin 20 MG tablet Commonly known as: LIPITOR Take 20 mg by mouth daily.   Auvi-Q 0.3 mg/0.3 mL Soaj injection Generic drug: EPINEPHrine Use as directed for life-threatening allergic reaction. What changed:   how much to take  how to take this  when to take this  reasons to take this   Baqsimi Two Pack 3 MG/DOSE Powd Generic drug: Glucagon Place 1 spray into the nose as directed. What changed: additional instructions   budesonide 0.5 MG/2ML nebulizer solution Commonly known as: PULMICORT Take 2 mLs (0.5 mg total) by nebulization as directed. What changed:   when to take this  reasons to take this   budesonide-formoterol 160-4.5 MCG/ACT inhaler Commonly known as: Symbicort Inhale 2 puffs into the lungs 2 (two) times daily. What changed:   when to take this  reasons to take this   calcium carbonate 1500 (600 Ca) MG Tabs tablet Commonly known as: OSCAL Take 600 mg of elemental calcium by mouth once a week.   Dexcom G6 Receiver Devi 1 Device by Does not apply route as directed.   glucose blood test strip Use to check blood sugar up to 10x daily   HumaLOG 100 UNIT/ML injection Generic drug: insulin lispro INJECT 300 UNITS UNDER THE SKIN VIA INSULIN PUMP EVERY 48 TO 72 HOURS PER HYPERGLYCEMIA AND DKA PROTOCOLS What changed: See the new instructions.   insulin lispro 100 UNIT/ML  KwikPen Junior Generic drug: insulin lispro Inject up to 50 units daily per provider instructions What changed:   how much to take  how to take this  when to take this  additional instructions   levocetirizine 5 MG tablet Commonly known as: Xyzal Take 1 tablet (5 mg total) by mouth daily as needed for allergies.   lipase/protease/amylase 10175 UNITS Cpep capsule Commonly known as: CREON Take 1 capsule (36,000 Units total) by mouth 3 (three) times daily with meals.   losartan 50 MG tablet Commonly known as:  COZAAR TAKE 1 TABLET(50 MG) BY MOUTH DAILY What changed: See the new instructions.   montelukast 10 MG tablet Commonly known as: SINGULAIR TAKE 1 TABLET(10 MG) BY MOUTH AT BEDTIME What changed: See the new instructions.   multivitamin with minerals Tabs tablet Take 1 tablet by mouth daily.   Nucala 100 MG/ML Soaj Generic drug: Mepolizumab INJECT 100MG  SUBCUTANEOUSLY EVERY 30 DAYS (GIVEN AT MD  OFFICE) What changed: See the new instructions.   Pen Needles 32G X 4 MM Misc Use to inject insulin 6x per day   topiramate 50 MG tablet Commonly known as: TOPAMAX TAKE 1 TABLET(50 MG) BY MOUTH TWICE DAILY What changed: See the new instructions.   Vitamin D (Ergocalciferol) 1.25 MG (50000 UNIT) Caps capsule Commonly known as: DRISDOL Take 50,000 Units by mouth every 7 (seven) days.       Disposition and follow-up:   Mr.Raney Aztlan Coll was discharged from [facility] [address] in Good condition.  At the hospital follow up visit please address:  1.   Follow up with Endocrinology recommended for continued titration of insulin pump.  Strongly recommend referral to outpatient diabetes coordinator Ensure psychiatry and therapy follow up  2.  Labs / imaging needed at time of follow-up: None  3.  Pending labs/ test needing follow-up: Fecal Elastase, proinsulin/insulin ratio  Follow-up Appointments:   Hospital Course by problem list:  1. Hypoglycemia  Patient presented to [facility] as a direct admit by his endocrinologist, Dr. Redge Gainer.  Patient has a long-term history of type 1 diabetes that was previously well controlled.  Patient was noting more difficulty in increasing his blood sugar after eating, in addition to significantly lower insulin requirement per day.  Additional symptoms included weight loss, constipation.  Initial work-up included MRI that showed pancreatic atrophy, however no other abnormal findings.  C-peptide was obtained and significantly low,  consistent with type 1 diabetes, however also effectively ruling out insulinoma.  TSH, free T4, free T3 that were all within normal limits.  Initial a.m. cortisol was in the indeterminate range at 6.5.  ACTH stimulation test was performed and within normal limits, ruling out adrenal insufficiency.  IgA and TTG antibody obtained; results negative ruling out celiac disease.  Given pancreatic atrophy seen on MRI, fecal elastase was obtained for evaluation of exocrine pancreatic insufficiency.  Serum lipase was within normal limits, however serum amylase was mildly decreased.  However serum results are indeterminate without the results of the fecal elastase yet.  Patient was empirically started on Creon on day of discharge and tolerated initial dose well.  He was sent home with a prescription with instructions to follow-up closely with his endocrinologist.  Of note, there is concern that patient's frequent monitoring of his CGM may be causing increased psychiatric distress including but not limited to lack of sleep and anxiety.  After discussing with Dr. Fransico Michael, it was suggested to switch from his insulin pump to a basal bolus system.  This was discussed with Mr. Doloris HallLipsky, who opted to continue with his insulin pump.  Discharge Exam:   BP 122/72 (BP Location: Right Arm)   Pulse (!) 102   Temp 98.2 F (36.8 C) (Oral)   Resp 18   Ht 5\' 11"  (1.803 m)   Wt 96.1 kg   SpO2 100%   BMI 29.55 kg/m  Discharge exam:  Physical Exam Vitals and nursing note reviewed.  Constitutional:      General: He is not in acute distress.    Appearance: He is not toxic-appearing.  HENT:     Head: Normocephalic and atraumatic.  Eyes:     Extraocular Movements: Extraocular movements intact.     Conjunctiva/sclera: Conjunctivae normal.  Cardiovascular:     Rate and Rhythm: Normal rate and regular rhythm.     Heart sounds: No murmur heard. No gallop.   Pulmonary:     Effort: Pulmonary effort is normal. No respiratory  distress.     Breath sounds: No wheezing, rhonchi or rales.  Abdominal:     General: Bowel sounds are normal.     Palpations: Abdomen is soft.  Musculoskeletal:        General: No deformity.     Right lower leg: No edema.     Left lower leg: No edema.  Skin:    General: Skin is warm and dry.  Neurological:     Mental Status: He is alert.  Psychiatric:        Attention and Perception: Attention normal.        Mood and Affect: Affect is blunt and flat.        Speech: Speech normal.        Behavior: Behavior normal. Behavior is cooperative.        Thought Content: Thought content normal.        Cognition and Memory: Cognition and memory normal.        Judgment: Judgment normal.    Pertinent Labs, Studies, and Procedures:   MRI Abdomen W/WO  1. Chronically thin/atrophic pancreas. 2. Known right kidney lower pole nonobstructive right renal calculi on recent CT. 3. There a few scattered colonic diverticula but no findings of active diverticulitis in the upper abdomen.  TSH: 0.23 Free T4: 1.1 Free T3: 3.6  IgA: 136 TTG antibody: Negative  C-peptide: Less than 0.1  A.m. cortisol: 6.5 ACTH stimulation test  Base cortisol: 8.9  30-minute cortisol: 21.6  60-minute cortisol: 24.6  Discharge Instructions: Discharge Instructions    Call MD for:  difficulty breathing, headache or visual disturbances   Complete by: As directed    Call MD for:  extreme fatigue   Complete by: As directed    Call MD for:  persistant dizziness or light-headedness   Complete by: As directed    Call MD for:  persistant nausea and vomiting   Complete by: As directed    Call MD for:  severe uncontrolled pain   Complete by: As directed    Diet Carb Modified   Complete by: As directed    Discharge instructions   Complete by: As directed    Mr. Gerrit HeckLipsky,   You were admitted to the hospital after your endocrinologist became concerned about your sugars.  While here, we had imaging done of your  stomach that showed some shrinkage of your pancreas, which can be expected with type 1 diabetes however we are testing for enzyme deficiencies.  Given that your symptoms are ongoing, we have started you on  tablets to supplement your pancreatic enzymes.  Please take 1 tablet with every meal.  While here, we have been able to rule out several different possibilities, including adrenal insufficiency, thyroid dysfunction, gluten intolerance called celiac's disease, and a type of tumor called insulinoma.  Please make sure to follow-up with Dr. Fransico Michael on Monday.   - Dr. Huel Cote   Increase activity slowly   Complete by: As directed       Signed: Dr. Verdene Lennert Internal Medicine PGY-2  Pager: [phone] After 5pm on weekdays and 1pm on weekends: On Call pager [phone]  02/02/2021, 1:54 PM

## 2021-02-03 LAB — PANCREATIC ELASTASE, FECAL: Pancreatic Elastase-1, Stool: 76 ug Elast./g — ABNORMAL LOW (ref >200)

## 2021-02-04 LAB — T3, FREE: T3, Free: 3.6 pg/mL (ref 2.3–4.2)

## 2021-02-04 LAB — IGA: Immunoglobulin A: 136 mg/dL (ref 47–310)

## 2021-02-04 LAB — TISSUE TRANSGLUTAMINASE, IGA: (tTG) Ab, IgA: <1 U/mL

## 2021-02-04 LAB — CORTISOL: Cortisol, Plasma: 16.5 ug/dL

## 2021-02-04 LAB — VITAMIN D 25 HYDROXY (VIT D DEFICIENCY, FRACTURES): Vit D, 25-Hydroxy: 51 ng/mL (ref 30–100)

## 2021-02-04 LAB — BETA-HYDROXYBUTYRATE: Beta-Hydroxybutyric Acid: 0.13 mmol/L

## 2021-02-04 LAB — T4, FREE: Free T4: 1.1 ng/dL (ref 0.8–1.8)

## 2021-02-04 LAB — TSH: TSH: 1.23 mIU/L (ref 0.40–4.50)

## 2021-02-04 LAB — ACTH: C206 ACTH: 23 pg/mL (ref 6–50)

## 2021-02-04 NOTE — Progress Notes (Signed)
S:     Chief Complaint  Patient presents with  . Diabetes    Education    Endocrinology provider: Dr. Fransico Michael (upcoming appt 02/27/2021 9:45 am)  Patient referred to me by Dr. Fransico Michael for tandem t:slim X2 insulin pump training. 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 Patient is currently using Dexcom G6 CGM. Patient is currently wearing Medtronic 670G pump.   Patient presents today independently. He is short of breath upon walking into appt and states he is feeling weak. Dr.Brennan came into appt to check on patient and see how he has been doing. He informed [name] to increase Creon from 36,000 lipase units (1 capsule) to 72,000 lipase units (2 capsules) with meals. He also advised [name] to take 36,000 lipase units (1 capsule) with snacks. Patient reports he has been adjusting his insulin pump settings - has been setting a temp basal of 0.9 units/hr for 2-3 hours at night or during the day. He also adjusts how much insulin he receives. He has not been bolusing more than 2 units.   Diet: -Breakfast/lunch (10-11am; took 1 capsule of Creon): 2 eggs, sausage link, [place], 1 slice of bread (15-16 g of carb), mustard -Snack (grazing throughout the day, no Creon): nuts, [place], broccoli, fig bar -Dinner (~7:30 pm; 1 capsule of Creon): eggs, chicken, broccoli, baby carrots, 1 slice of breat (15-16 g of carb) -Snack (grazes after dinner): quest protein chips, nuts, [place], fig bar -Fast food: none  -Sweets/deserts: none -Pasta/rice: none -Fruit: 1 or 2 slices of apple, blueberries -Cereal: none  -Chips/crackers: quest protein chips -Milk: none (lactose intolerant) -Soda/juice   Insurance: [employer]  Pump Serial  Number: [id number]  Infusion Set: Trusteel 6 mm  Tandem T:Slim X2 Insulin Pump Education Training Please refer to Insulin Pump Training Checklist scanned into media  BG Before Training: 235 mg/dL  Dexcom Clarity Report    Carelink Report   Carelink Report  Assessment: Pump Settings - Based on patient's report he takes 30 for TDD of insulin; this is similar to 0.3 units daily for TDD based on his weight. He is receiving about 85% basal and 15% bolus. Patient is SEVERELY UNDERBOLUSING. He will manually bolus if he does not agree with carbs. He is extremely fearful of hypoglycemia. Explained patient's basal / bolus ratio must be 50:50 or at least 40% basal, 60% bolus. He is likely having hypoglycemia after bolusing from receiving basal rate that may be too high (although it does appear to be a normal basal rate for patient's age/weight/height). Will SLOWLY decrease basal rate. Will change to 1.1 units/hr the entire day. Will change ICR 1:10 and ISF 1:50. Advised patient instead of manually changing bolus, he can adjust ICR by 2 if he notices hypoglycemia after a meal two days in a row at the same time (e.g., change 1:10 --> 1:12) AND he can adjust ISF by 5 if he notices hypoglycemia after a correction dose two days in a row at the same time. Advised him to also send me a MyChart message once he does this. He is agreeable. Will f/u with patient on 02/11/21.  Pump Education - Tandem t:slim X2 Insulin pump applied successfully to side of left leg. Insulin pump was synced with Dexcom G6 CGM to use Control IQ technology. Parents appeared to have sufficient understanding of subjects  discussed during Tandem t:slim X2 insulin pump training appt.   Creon - Counseled patient on appropriate dose/administration of Creon. Patient must consume at least 100 grams of fat daily.   Plan: 1. Pump Settings  Basal (Max: 3.0) 12AM 1.1                     Total: 26.4 units  Insulin to carbohydrate ratio (ICR)   12AM 10                     Max Bolus: 25  Insulin Sensitivity Factor (ISF) 12AM 50                      Target BG 12AM 110                      2. Tandem T:Slim X2 Insulin Pump  a. Continue to wear Tandem T:Slim insulin pump and change infusion set site every 3 days (cartridge filled 300 units) b. Thoroughly discussed how to assess bad infusion site change and appropriate management (notice BG is elevated, attempt to bolus via pump, recheck BG in 30 minutes, if BG has not decreased then disconnect pump and administer bolus via insulin pen, apply new infusion set, and repeat process).  a. Discussed back up plan if pump breaks (how to calculate insulin doses using insulin pens). Provided written copy of patient's current pump settings and handout explaining math on how to calculate settings. Discussed examples with family. Patient was able to use teach back method to demonstrate understanding of calculating dose for basal/bolus insulin pens from insulin pump settings.  i. Patient will use Medtronic pump for back up if necessary.  3. Creon a. Counseled patient on Creon. Advised him he must take 2 tablets with meals and must consume 100 grams of fat/daily for Creon to be appropriately absorbed.  4. Reimbursement a. Faxed invoice and training checklist to Tandem 5. Follow Up:  a. 02/11/21  Written patient instructions provided.    This appointment required 120 minutes of patient care (this includes precharting, chart review, review of results, face-to-face care, etc.).  Thank you for involving clinical pharmacist/diabetes educator to assist in providing this patient's care.  Zachery Conch, PharmD, CPP, CDCES

## 2021-02-05 ENCOUNTER — Telehealth (INDEPENDENT_AMBULATORY_CARE_PROVIDER_SITE_OTHER): Payer: Self-pay | Admitting: "Endocrinology

## 2021-02-05 ENCOUNTER — Telehealth: Payer: Self-pay | Admitting: "Endocrinology

## 2021-02-05 NOTE — Telephone Encounter (Signed)
  Who's calling (name and relationship to patient) : [name] (self)  Best contact number: [phone]  Provider they see: Dr. Fransico Michael  Reason for call: Patient states he just got out of the hospital and he was advised to call our office to speak with Dr. Fransico Michael.    PRESCRIPTION REFILL ONLY  Name of prescription:  Pharmacy:

## 2021-02-05 NOTE — Telephone Encounter (Signed)
1. [name] called the office this afternoon. He wanted to discuss his recent admission. 2. [name] was admitted to the Internal Medicine Service on 01/30/21 for evaluation and management of his hypoglycemia, fatigue, and unintentional weight loss.   A. To rule out autoimmune adrenal insufficiency (Addison's disease), two sets of ACTH and cortisol values were performed. On 01/30/21 at 1:56 PM, his ACTH was 23 (ref 6-50 in the AM) and his cortisol was 16.5 (ref 4-22 at 8 AM, 3-17 at 4 PM). On 01/31/21 at 7:24 AM his ACTH was 29.9 (ref 7.2-63.3 in the AM) and his cortisol was 6.5 (ref 6.7-22.6 in the AM). Although the ACTH values were well within normal limits, which would tend to exclude adrenal cortical insufficiency, he had one normal cortisol and one low cortisol. To further evaluate his hypothalamic-pituitary-adrenal (HPA) axis, an ACTH stimulation test was performed on 02/01/21 at 8:21 AM. Baseline cortisol was normal at 8.9. +30 minute cortisol was normal at 21.6. +60 minute cortisol was normal at 24.6. This very brisk and normal response ruled out adrenal insufficiency.   B. To rule out celiac disease, a tissue transglutaminase IgA and IgA were drawn. The tTG IgA was normal at <1.0. The IgA was normal at 136 (ref 47-310). Dr. Christella Hartigan also performed an endoscopy on 01/28/21 and took biopsies of the small stomach and small intestine. The gastric biopsies were negative for H. Pylori and the small intestinal biopsies were negative for celiac disease.    C. To rule out an extrapancreatic insulinoma, a C-peptide was obtained on 01/30/21. The value was very low at <0.1 (ref 1.1-4.4), c/w his autoimmune T1DM.  D. To rule out a pancreatic tumor, an abdominal MRI was performed with and without contrast on 01/31/21. The pancreas was "moderately thin/atrophic", similar to a CT scan performed on 03/10/18. "There was no abnormal pancreatic parenchymal enhancement."   E. To rule out exocrine pancreas insufficiency, three tests were  performed on 01/31/21.  The serum lipase was normal at 22 (ref 11-51). The serum amylase was low at 23 (ref 28-100). The stool pancreatic elastase was markedly low at 76 (ref >200). The low serum amylase and the very low stool elastase confirmed the clinical suspicion of pancreatic exocrine deficiency. Unfortunately, we do not know the cause of this exocrine insufficiency.  3. [name] was discharged on 02/02/21 and started taking Creon that day. His Creon dose is one 36,000 unit capsule by mouth with meals.  4. Today he is eating more and having some diarrhea. His BGs are higher. He is no longer having BGs <100. His highest BGs have been 250-260. His weight is unchanged. He stopped the auto mode function of his insulin pump and is manually making adjustments to his pump settings as he begins to learn how the Creon will affect different meals. He is due to have a nutrition consultation tomorrow and to start his T-slim pump on 02/08/20. I will see [name] then,. 5. I will also send a message to Dr. Christella Hartigan in GI to notify him of these latest events.   Molli Knock, MD, CDE Adult and Pediatric Endocrinology

## 2021-02-05 NOTE — Telephone Encounter (Signed)
Sent to Dr Fransico Michael in secure Chat

## 2021-02-06 ENCOUNTER — Encounter: Payer: Self-pay | Admitting: Dietician

## 2021-02-06 ENCOUNTER — Telehealth: Payer: Self-pay

## 2021-02-06 ENCOUNTER — Other Ambulatory Visit: Payer: Self-pay

## 2021-02-06 ENCOUNTER — Encounter: Payer: 59 | Attending: Family Medicine | Admitting: Dietician

## 2021-02-06 VITALS — Ht 72.0 in | Wt 206.1 lb

## 2021-02-06 DIAGNOSIS — E440 Moderate protein-calorie malnutrition: Secondary | ICD-10-CM | POA: Insufficient documentation

## 2021-02-06 NOTE — Telephone Encounter (Signed)
-----   Message from Rachael Fee, MD sent at 02/06/2021  7:15 AM EDT ----- Thanks for the note. That is a great outcome.  We'll get him back in the office.   Sherrine Salberg, He needs my first available OV, offer refills of his creon if needed until that appt.  Thanks    ----- Message ----- From: David Stall, MD Sent: 02/05/2021   9:51 PM EDT To: Rachael Fee, MD  Dr. Christella Hartigan: A lot has happened to [name] since you saw him on 01/28/21. I had him admitted to the IM Service on 01/30/21 for E&M of his hypoglycemia, fatigue, and unintentional weight loss. He turned out to have a thin, atrophic pancreas on MRI and severe pancreatic exocrine deficiency. He is now taking Creon and is feeling better and has higher BGs. Please see my telephone note from 02/05/21 in which I summarized the findings. I would appreciate it if you would contact [name] and take over the management of his Creon. I would also welcome your opinion as to what caused his exocrine deficiency. Thanks in advance. David Stall, MD, CDE Adult and Pediatric Endocrinology Cell [phone]

## 2021-02-06 NOTE — Patient Instructions (Addendum)
Continue your high protein diet. Follow these recommendations. Calories: 2200 Protein: 115 g  Use your fruit juice or soda when your blood sugar goes low.  Sources of simple sugars include: fruit, fruit juice, sugar sweetened beverages, honey, table sugar, candies, and dairy.  Consider taking a log of your foods and GI symptoms. This will help identify problem foods and potential changes we can make while adjusting your intake with the Creon.  Switch to [place] sausage, and peanut butter instead of peanuts.

## 2021-02-06 NOTE — Telephone Encounter (Signed)
The pt has an appt on 5/25 with Dr Christella Hartigan for followup that was scheduled earlier. The pt has been advised and will let his pharmacy know when refills are needed.

## 2021-02-06 NOTE — Progress Notes (Signed)
Medical Nutrition Therapy  Appointment Start time:  1100  Appointment End time:  1130  Primary concerns today: Weight Gain/Increasing Calories  Referral diagnosis: E44.0 Mild protein-calorie malnutrition Preferred learning style: No preference indicated Learning readiness: Ready   NUTRITION ASSESSMENT   Anthropometrics  Ht: 6' Wt: 206.1 lbs Wt Loss: -6.5 lbs Body mass index is 27.95 kg/m.   Clinical Medical Hx: T1DM, Hyperglycemia, HTN, Retinopathy, Microalbuminuria, Hyperlipidemia Medications: See Chart - Insulin Pump, Dexcom Labs: CBG -308 (Admitted to ER for hypoglycemia, 01/30/2021) CO2 - 21 (low) NEW: Amylase - 23 (Low) Pancreatic Elastase - 76 (Low) Notable Signs/Symptoms: Fatigue, Weight Loss  Lifestyle & Dietary Hx Pt reports going to hospital recently for hypoglycemia. Pt reports trying to eat some carbs to get their sugar up with no success. Pt was discovered to have decreased pancreas size and function. Pt has started Creon (pancreatic enzymes) due to pancreatic exocrine insufficieny. Taking it with every meal. Pt reports frustration with not knowing what to eat now.  Pt reports cramps and some diarrhea with Creon. Pt reports eating more eggs to increase protein. Pt has been limiting their carbs since starting Creon, has been trying to figure out how to increase their carbs to keep their glycemic response consistent.  Pt states 1.8-1.9 units of insulin per 15g of carbs is their current IC ratio. States it was previously 2.6:1. Pt reports a 50-60 point increase in BG in response to 15g of carbs. Pt reports they will be switching to the T-Link pump that connects with their Dexcom tomorrow. Pt reports concern with the T-link delivering extra boluses  Body Composition Scale Date 01/08/2021  02/06/2021  Current Body Weight 212.6 lbs 206.1 lbs  Total Body Fat % 22.1 20.8  Visceral Fat 13 12  Fat-Free Mass % 77.8 79.1   Total Body Water % 58.8 60.1  Muscle-Mass lbs 40.0  38.7  BMI 28.8 27.9  Body Fat Displacement           Torso  lbs 29.1 26.6         Left Leg  lbs 5.8 5.3         Right Leg  lbs 5.8 5.3         Left Arm  lbs 2.9 2.6         Right Arm   lbs 2.9 2.6    Estimated daily fluid intake: ~160 oz Supplements: Daily MV, 1,000mg  Vit D weekly, B12 Sleep: Difficulties Stress / self-care: Fear of food a/w hyperglycemia Current average weekly physical activity: ADLs  24-Hr Dietary Recall First Meal: 2 eggs, toast, deli [place] with mustard, water Snack:  Second Meal: 1/4 cup peanuts, 2 slices of [place], water Snack:  Third Meal: 1 slice of bread, [place] sandwich, eggs and sausage, chicken skewer, broccoli, water Snack: none Beverages: 8 glasses of water  Estimated Energy Needs Calories: 2200 Protein: 115 g   NUTRITION DIAGNOSIS  NB-1.1 Food and nutrition-related knowledge deficit As related to protein-calorie malnutriton.  As evidenced by pt reported inability to eat, ~900 Kcal a day intake, and fear of eating related to adverse glycemic response related to difficulties with insulin pump administration .   NUTRITION INTERVENTION  Nutrition education (E-1) on the following topics:  Educated patient on the pathophysiology of diabetes. This includes why our bodies need circulating blood sugar, the relationship between insulin and blood sugar, and the results of insulin resistance and/or pancreatic insufficiency on the development of diabetes. Educated patient on factors that contribute to elevation of  blood sugars, such as stress, illness, injury,and food choices. Discussed the role that physical activity plays in lowering blood sugar. Educate patient on the three main macronutrients. Protein, fats, and carbohydrates. Discussed how each of these macronutrients affect blood sugar levels, especially carbohydrate, and the importance of eating a consistent amount of carbohydrate throughout the day. Educated patient on carbohydrate counting, 15g of  carbohydrate equals one carb choice. Advised patient on the importance of consistently checking their blood sugar, and recognizing how lifestyle and food choices affect those numbers. Educated patient on the "Rule of 15". Treat low blood sugar, below 70 mg/dL, by eating 92J of fast acting carbohydrate and rechecking blood sugar after 15 minutes. If blood sugar is still low, repeat. Discussed the role of diabetes medications in getting blood sugar under control, and considerations that may need to be taken to avoid complications. NEW: Educated patient about the exocrine function of the pancreas and the nutritional implications of exocrine insufficiency. Educated patient on the changes their body will go through when implementing PERT (Creon).  Handouts Provided Include   Yellow Meal Card  Balanced Plate Food List  Learning Style & Readiness for Change Teaching method utilized: Visual & Auditory  Demonstrated degree of understanding via: Teach Back  Barriers to learning/adherence to lifestyle change: Insulin pump complications  Goals Established by Pt  Continue your high protein diet. Follow these recommendations.  Calories: 2200  Protein: 115 g  Use your fruit juice or soda when your blood sugar goes low.  Sources of simple sugars include: fruit, fruit juice, sugar sweetened beverages, honey, table sugar, candies, and dairy.  Consider taking a log of your foods and GI symptoms. This will help identify problem foods and potential changes we can make while adjusting your intake with the Creon.  Switch to [place] sausage, and peanut butter instead of peanuts.   MONITORING & EVALUATION Dietary intake, weekly physical activity, and glycemic control in 2 months.  Next Steps  Patient is to monitor blood sugar very closely while increasing calories, log foods, and bring reports to follow up with RD.

## 2021-02-07 ENCOUNTER — Ambulatory Visit (INDEPENDENT_AMBULATORY_CARE_PROVIDER_SITE_OTHER): Payer: 59 | Admitting: Pharmacist

## 2021-02-07 ENCOUNTER — Other Ambulatory Visit: Payer: Self-pay

## 2021-02-07 VITALS — Wt 205.6 lb

## 2021-02-07 DIAGNOSIS — E103553 Type 1 diabetes mellitus with stable proliferative diabetic retinopathy, bilateral: Secondary | ICD-10-CM

## 2021-02-07 LAB — POCT GLUCOSE (DEVICE FOR HOME USE): POC Glucose: 235 mg/dl — AB (ref 70–99)

## 2021-02-09 NOTE — Progress Notes (Signed)
This is a Pediatric Specialist E-Visit (My Chart Video Visit) follow up consult provided via WebEx [name] consented to an E-Visit consult today.  Location of patient: [name] is at home  Location of provider: Zachery Conch, PharmD, CPP, CDCES is at office.    S:     Chief Complaint  Patient presents with  . Diabetes    Pump Follow Up    Endocrinology provider: Dr. Fransico Michael (upcoming appt 02/27/21 9:45 am)  Patient referred to me by Dr. Fransico Michael for insulin pump initiation and training. 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 Patient wears a t:slim X2 insulin pump and Dexcom G6 CGM. Patient was started on t:slim X2 insulin pump on 02/07/21.   I connected with [name] on 02/11/21 by video and verified that I am speaking with the correct person using two identifiers. He reports he has increased Creon to 2 capsules with meals (3 meals/day) and 1 capsule with snacks (2 snacks/day). He states Creon "still tears up his stomach". He has not taken anything for GI upset - denies antacid (Tums) / H2RA /PPI. He states his gastroenterologist appt got moved up to 2 weeks (rather than 1 month). The Tandem pump has been working well for him. He states he feels sometimes that his insulin doses do not work as well (reports he may be laying on his cannula and may be causign it to bend) but when he gets up to walk around his BG will decrease He has not had any bad pump sites. He has not made any pump adjustments. He has entered additional carbs ("ghost carbs") when BG does not decrease. He has questions related to sleep activity and how to change the times.  Insurance: Scientist, research (medical)   Basal(Max: 3.0) 12AM 1.1                 Total: 26.4 units  Insulin to carbohydrate ratio (ICR)  12AM 10                 Max Bolus: 25  Insulin Sensitivity Factor (ISF) 12AM 50                  Target BG 12AM 110                   Pump Serial Number: [id number]   Infusion Set: Trusteel 6 mm   Infusion Set Sites -Patient-reports infusion sites are thighs --Patient reports independently doing infusion set site changes --Patient reports rotating infusion set sites  Diet: Patient reported dietary habits:  -Breakfast (10-11am; took 2 capsule of Creon): 2 eggs, sausage link, [place], 1 slice of bread (15-16 g of carb), mustard -Lunch (~2pm): sandwich (~30 grams of carb)  -Snack (sometimes, 1 capsule of Creon): nuts, [place], broccoli, fig bar -Dinner (~7:30 pm; 2 capsules of Creon): eggs, chicken, broccoli, baby carrots, 1 slice of bread (15-16 g of carb) -Snack (~12-1am, always snacks at this dinner; 1 capsule of Creon): quest protein chips, nuts, [place], fig bar -Fast food: none  -Sweets/deserts: none -Pasta/rice: none -Fruit: 1 or 2 slices of apple, blueberries -Cereal: none  -Chips/crackers: quest protein chips -Milk: none (lactose intolerant) -Soda/juice: none  Exercise: Patient-reported exercise habits: walks ~20 min daily   Monitoring: Patient reports 1 episode of  nocturia (nighttime urination) each night Patient denies neuropathy (nerve pain). Patient reports visual changes (blurry vision since eye surgery). (Followed by ophthalmology; last seen "a few months ago" by Dr. Luciana Axe (retinal specialist)) Patient reports self foot exams; no open cuts/wounds   O:   Labs:   Dexcom G6 CGM Report      Tconnect Report  Patient has changed below target to less than 120; per Dexcom G6 CGM report patient is NOT experiencing hypoglycemia. Patient is receiving lower basal rate during the day and higher  basal rate overnight. Patient is receiving less basal dose than programmed overrall (total basal ~16.82 units).  There were no vitals filed for this visit.  Lab Results  Component Value Date   HGBA1C 6.3 (A) 01/30/2021   HGBA1C 7.5 (A) 12/05/2020   HGBA1C 6.2 (A) 08/16/2020    Lab Results  Component Value Date   CPEPTIDE <0.1 (L) 01/30/2021       Component Value Date/Time   CHOL 197 01/24/2021 0853   TRIG 111 01/24/2021 0853   HDL 40 01/24/2021 0853   CHOLHDL 4.9 01/24/2021 0853   VLDL 19 12/29/2016 1146   LDLCALC 135 (H) 01/24/2021 0853    Lab Results  Component Value Date   MICRALBCREAT 2 05/14/2020    Assessment: Patient sounds much better and appears to be feeling better than from prior visits. TIR is close to goal > 70%. No hypoglycemia. Most noticeable trend is elevated post prandial BG readings after lunch; will change ICR 10 -->8.  Answered all pump related questions (spent about 10-15 min discussing). Continue wearing Dexcom G6 CGM. Will f/u in 1 week per patient preference.  Plan: 1. Insulin pump settings: Insulin to carbohydrate ratio (ICR)  12AM 10  12PM 8  5PM 10           Max Bolus: 25 2. Monitoring:  a. Continue wearing Dexcom G6 CGM b. [name] has a diagnosis of diabetes, checks blood glucose readings > 4x per day, wears an insulin pump, and requires frequent adjustments to insulin regimen. This patient will be seen every six months, minimally, to assess adherence to their CGM regimen and diabetes treatment plan. 3. Follow Up: 1 week  This appointment required 40 minutes of patient care (this includes precharting, chart review, review of results, face-to-face care, etc.).  Thank you for involving clinical pharmacist/diabetes educator to assist in providing this patient's care.  Zachery Conch, PharmD, CPP, CDCES

## 2021-02-11 ENCOUNTER — Telehealth: Payer: Self-pay | Admitting: Gastroenterology

## 2021-02-11 ENCOUNTER — Other Ambulatory Visit: Payer: Self-pay

## 2021-02-11 ENCOUNTER — Telehealth (INDEPENDENT_AMBULATORY_CARE_PROVIDER_SITE_OTHER): Payer: 59 | Admitting: Pharmacist

## 2021-02-11 DIAGNOSIS — E1069 Type 1 diabetes mellitus with other specified complication: Secondary | ICD-10-CM

## 2021-02-11 MED ORDER — PANCRELIPASE (LIP-PROT-AMYL) 36000-114000 UNITS PO CPEP: ORAL_CAPSULE | ORAL | 11 refills | Status: DC

## 2021-02-11 NOTE — Telephone Encounter (Signed)
The pt has been scheduled for 5/3 at 250 pm.  Creon has been sent to the pharmacy. The pt has been advised of the information and verbalized understanding.

## 2021-02-11 NOTE — Telephone Encounter (Signed)
Please contact him.  His currently scheduled return office visit with me is 5 or 6 weeks away.  I would like to get him in a bit sooner than that.  Please look for appointment with me in about 2 weeks from now, double book if needed.  I would like his Creon prescription to be the following.  Creon 36,000 units (he is already getting that dose), he should take 2 pills with the first bite of every meal and 1 pill with first bite of every snack.  Dispense 250 pills with 11 refills.  Thanks

## 2021-02-12 ENCOUNTER — Other Ambulatory Visit: Payer: Self-pay

## 2021-02-12 ENCOUNTER — Ambulatory Visit (HOSPITAL_COMMUNITY)
Admission: RE | Admit: 2021-02-12 | Discharge: 2021-02-12 | Disposition: A | Discharge status: Home / Self Care | Payer: 59 | Source: Ambulatory Visit | Attending: Interventional Radiology | Admitting: Interventional Radiology

## 2021-02-12 ENCOUNTER — Encounter (HOSPITAL_COMMUNITY): Payer: Self-pay

## 2021-02-12 DIAGNOSIS — I771 Stricture of artery: Secondary | ICD-10-CM | POA: Diagnosis not present

## 2021-02-12 MED ORDER — IOHEXOL 350 MG/ML SOLN
80.0000 mL | Freq: Once | INTRAVENOUS | Status: AC | PRN
  Administered 2021-02-12: 80 mL via INTRAVENOUS

## 2021-02-13 ENCOUNTER — Other Ambulatory Visit: Payer: Self-pay | Admitting: Family Medicine

## 2021-02-13 ENCOUNTER — Other Ambulatory Visit (INDEPENDENT_AMBULATORY_CARE_PROVIDER_SITE_OTHER): Payer: Self-pay | Admitting: "Endocrinology

## 2021-02-14 ENCOUNTER — Other Ambulatory Visit (INDEPENDENT_AMBULATORY_CARE_PROVIDER_SITE_OTHER): Payer: Self-pay | Admitting: "Endocrinology

## 2021-02-15 NOTE — Progress Notes (Deleted)
S:     No chief complaint on file.   Endocrinology provider: Dr. Fransico Michael (upcoming appt 02/27/21 9:45 am)  Patient referred to me by Dr. Fransico Michael for insulin pump initiation and training. 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 Patient wears a t:slim X2 insulin pump and Dexcom G6 CGM. Patient was started on t:slim X2 insulin pump on 02/07/21.   Patient presents for follow up pump appointment. ***  Insurance: Animator   Basal(Max: 3.0) 12AM 1.1                 Total: 26.4 units  Insulin to carbohydrate ratio (ICR)  12AM 10  12PM 8  5PM 10           Max Bolus: 25  Insulin Sensitivity Factor (ISF) 12AM 50                  Target BG 12AM 110                   Pump Serial Number: [id number]   Infusion Set: Trusteel 6 mm   Infusion Set Sites -Patient *** infusion sites are thighs --Patient *** independently doing infusion set site changes --Patient *** rotating infusion set sites  Diet  (*** changes since prior appt on 02/11/21) Patient reported dietary habits:  -Breakfast (10-11am; took 2 capsule of Creon): 2 eggs, sausage link, [place], 1 slice of bread (15-16 g of carb), mustard -Lunch (~2pm): sandwich (~30 grams of carb)  -Snack (sometimes, 1 capsule of Creon): nuts, [place], broccoli, fig bar -Dinner (~7:30 pm; 2 capsules of Creon): eggs, chicken, broccoli, baby carrots, 1 slice of bread (15-16 g of carb) -Snack (~12-1am, always snacks at this dinner; 1 capsule of Creon): quest protein chips, nuts, [place], fig bar -Fast food: none  -Sweets/deserts: none -Pasta/rice: none -Fruit: 1 or 2 slices of apple,  blueberries -Cereal: none  -Chips/crackers: quest protein chips -Milk: none (lactose intolerant) -Soda/juice: none  Exercise (*** changes since prior appt on 02/11/21) Patient-reported exercise habits: walks ~20 min daily   Monitoring: Patient *** *** episode of nocturia (nighttime urination) each night Patient *** neuropathy (nerve pain). Patient *** visual changes (blurry vision since eye surgery). (Followed by ophthalmology; last seen "a few months ago" by Dr. Luciana Axe (retinal specialist) as of 02/11/21) Patient *** self foot exams; no open cuts/wounds   O:   Labs:   Dexcom G6 CGM Report  ***   Tconnect Report ***  Patient has changed below target to less than 120; per Dexcom G6 CGM report patient is NOT experiencing hypoglycemia. Patient is receiving lower basal rate during the day and higher basal rate overnight. Patient is receiving less basal dose than programmed overrall (total basal ~16.82 units). ***  There were no vitals filed for this visit.  Lab Results  Component Value Date   HGBA1C 6.3 (A) 01/30/2021   HGBA1C 7.5 (A) 12/05/2020   HGBA1C 6.2 (A) 08/16/2020    Lab Results  Component Value Date   CPEPTIDE <0.1 (L) 01/30/2021       Component Value Date/Time   CHOL 197 01/24/2021 0853   TRIG 111 01/24/2021 0853   HDL 40 01/24/2021 0853   CHOLHDL 4.9 01/24/2021 0853   VLDL 19 12/29/2016 1146   LDLCALC 135 (H) 01/24/2021 6378  Lab Results  Component Value Date   MICRALBCREAT 2 05/14/2020    Assessment: TIR is *** to goal > 70%. *** hypoglycemia.   Plan: 1. Insulin pump settings: *** 2. Monitoring:  a. Continue wearing Dexcom G6 CGM b. Ronell Duffus has a diagnosis of diabetes, checks blood glucose readings > 4x per day, wears an insulin pump, and requires frequent adjustments to insulin regimen. This patient will be seen every six months, minimally, to assess adherence to their CGM regimen and diabetes treatment plan. 3. Follow Up:  ***  This appointment required *** minutes of patient care (this includes precharting, chart review, review of results, face-to-face care, etc.).  Thank you for involving clinical pharmacist/diabetes educator to assist in providing this patient's care.  Zachery Conch, PharmD, CPP, CDCES

## 2021-02-18 ENCOUNTER — Telehealth (HOSPITAL_COMMUNITY): Payer: Self-pay

## 2021-02-18 NOTE — Telephone Encounter (Signed)
Pt agreed to f/u in 1 year with cta head/neck. AW  

## 2021-02-19 ENCOUNTER — Encounter (INDEPENDENT_AMBULATORY_CARE_PROVIDER_SITE_OTHER): Payer: Self-pay

## 2021-02-19 ENCOUNTER — Telehealth (INDEPENDENT_AMBULATORY_CARE_PROVIDER_SITE_OTHER): Payer: 59 | Admitting: Pharmacist

## 2021-02-20 ENCOUNTER — Telehealth (INDEPENDENT_AMBULATORY_CARE_PROVIDER_SITE_OTHER): Payer: 59 | Admitting: Pharmacist

## 2021-02-20 DIAGNOSIS — E1069 Type 1 diabetes mellitus with other specified complication: Secondary | ICD-10-CM

## 2021-02-20 NOTE — Progress Notes (Addendum)
S:     Chief Complaint  Patient presents with   Diabetes    Pump Follow Up    Endocrinology provider: Dr. Fransico Michael (upcoming appt 02/27/21 9:45 am)  Patient referred to me by Dr. Fransico Michael for insulin pump initiation and training. 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 Patient wears a t:slim X2 insulin pump and Dexcom G6 CGM. Patient was started on t:slim X2 insulin pump on 02/07/21.   Patient presents for follow up pump appointment. Patient states he was seen at [facility] yesterday by ophthalmology. They stated they found "some fluid" and retinopathy may have worsened. They advised him to follow up with retinal specialist. [name] feels that Tandem pump therapy has been going "interesting". He feels it has quirks - tubing gets kinked easier than Medtronic. He feels since being on Creon that his BG are increasing more with meals. He is using extended bolus from time to time and feels this is helpful. He also would like me to provide feedback to Tandem - he wishes the belt clip could be adjustable so he could turn pump around more easily and he feels there should be a guard on tubing located closest to pump to prevent tubing from being kinked. He also let me know he changed his ICR lunch time to begin at 11 AM rather than 12PM as he eats at 11AM more often.   Insurance: Development worker, international aid (Max: 3.0) 12AM 1.1                           Total: 26.4 units   Insulin to carbohydrate ratio (ICR)  12AM 10  12PM --> 11AM 8  5PM 10                 Max Bolus: 25   Insulin Sensitivity Factor (ISF) 12AM 50                             Target BG 12AM 110                             Pump Serial  Number: [id number]   Infusion Set: Trusteel 6 mm   Infusion Set Sites -Patient reports infusion sites are thighs --Patient reports independently doing infusion set site changes --Patient reports rotating infusion set sites  Diet  (no changes since prior appt on 02/11/21) Patient reported dietary habits:  -Breakfast (10-11am; took 2 capsule of Creon): 2 eggs, sausage link, [place], 1 slice of bread (15-16 g of carb), mustard -Lunch (~2pm): sandwich (~30 grams of carb)  -Snack (sometimes, 1 capsule of Creon): nuts, [place], broccoli, fig bar -Dinner (~7:30 pm; 2 capsules of Creon): eggs, chicken, broccoli, baby carrots, 1 slice of bread (15-16 g of carb) -Snack (~12-1am, always snacks at this dinner; 1 capsule of Creon): quest protein chips, nuts, [place], fig bar -Fast food: none  -Sweets/deserts: none -Pasta/rice: none -Fruit: 1 or 2 slices of apple, blueberries -Cereal: none  -Chips/crackers: quest protein chips -Milk: none (lactose intolerant) -Soda/juice: none  Exercise (no changes since prior appt on 02/11/21) Patient-reported exercise habits: walks ~20 min daily   Monitoring: Patient reports 1x episode of nocturia (nighttime  urination) each night Patient reports occasional neuropathy (nerve pain). Patient denies visual changes (blurry vision since eye surgery). (Followed by ophthalmology; last seen "a few months ago" by Dr. Luciana Axe (retinal specialist) as of 02/11/21) Patient reports self foot exams; no open cuts/wounds   O:   Labs:   Dexcom G6 CGM Report     Tconnect Report   Patient has changed below target to less than 110; per Dexcom G6 CGM report patient is NOT experiencing hypoglycemia.  There were no vitals filed for this visit.  Lab Results  Component Value Date   HGBA1C 6.3 (A) 01/30/2021   HGBA1C 7.5 (A) 12/05/2020   HGBA1C 6.2 (A) 08/16/2020    Lab Results  Component Value Date   CPEPTIDE <0.1 (L) 01/30/2021       Component Value Date/Time   CHOL  197 01/24/2021 0853   TRIG 111 01/24/2021 0853   HDL 40 01/24/2021 0853   CHOLHDL 4.9 01/24/2021 0853   VLDL 19 12/29/2016 1146   LDLCALC 135 (H) 01/24/2021 0853    Lab Results  Component Value Date   MICRALBCREAT 2 05/14/2020    Assessment: TIR is at goal > 70%. No hypoglycemia. Patient is extremely well controlled. Most noticeable pattern myself and patient noticed is post prandial hyperglycemia. BG after meals does not spike particularly high however does remain elevated for extended period of time - may possibly be gastroparesis? Advised him to f/u with GI to see their thoughts on gastroparesis vs solely exocrine insulin issue. We decided together to decrease ICR. I also told patient I would give feedback about Tandem pump to Tandem. F/u in 2 weeks.   Plan: Insulin pump settings:  Insulin to carbohydrate ratio (ICR)  12AM 10 --> 8  12PM --> 11AM  8  5PM 10 --> 8                 Max Bolus: 25 Monitoring:  Continue wearing Dexcom G6 CGM [name] has a diagnosis of diabetes, checks blood glucose readings > 4x per day, wears an insulin pump, and requires frequent adjustments to insulin regimen. This patient will be seen every six months, minimally, to assess adherence to their CGM regimen and diabetes treatment plan. Follow Up: 2 weeks  This appointment required 45 minutes of patient care (this includes precharting, chart review, review of results, face-to-face care, etc.).  Thank you for involving clinical pharmacist/diabetes educator to assist in providing this patient's care.  Zachery Conch, PharmD, CPP, CDCES  Molli Knock, MD, CDE

## 2021-02-21 ENCOUNTER — Encounter: Payer: Self-pay | Admitting: Adult Health

## 2021-02-21 ENCOUNTER — Ambulatory Visit (INDEPENDENT_AMBULATORY_CARE_PROVIDER_SITE_OTHER): Payer: 59 | Admitting: Adult Health

## 2021-02-21 VITALS — BP 107/67 | HR 80 | Ht 72.0 in | Wt 209.0 lb

## 2021-02-21 DIAGNOSIS — G932 Benign intracranial hypertension: Secondary | ICD-10-CM

## 2021-02-21 DIAGNOSIS — I672 Cerebral atherosclerosis: Secondary | ICD-10-CM

## 2021-02-21 NOTE — Patient Instructions (Signed)
Restart ASA 81 mg. Daily- if unable to tolerate can do every other day

## 2021-02-21 NOTE — Progress Notes (Signed)
PATIENT: [name] DOB: 02-24-1981  REASON FOR VISIT: follow up HISTORY FROM: patient  HISTORY OF PRESENT ILLNESS: Today 02/21/21:  [name] is a 40 year old male with a history of left intracranial internal carotid artery stenosis.  He returns today for follow-up.  He states that he was taking aspirin daily but was having a hard time getting access for his insulin pump due to excessive bleeding.  He stopped aspirin.  He is curious if he should start it back.  The patient also has seen neuro-ophthalmology at [facility].  I reviewed their note in care everywhere.  The patient has been encouraged by that physician to follow-up with Dr. Luciana Axe.  He was encouraged to remain on Diamox 500 mg daily and Topamax 50 mg twice a day due to IIH.  The patient has an appointment in July with neurologist Leonides Grills, DOfor second opinion.  HISTORY [name] today, he underwent an angiogram of the brain on 06-2020. His findings from her previous MRA of the brain from the 15th of this month were confirmed there was at approximately 65 to 70% stenosis of the left intracranial internal carotid artery at the supraclinoid segment noted mild irregularity of the of the flow showed that there must be an atherosclerotic plaque. 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 These findings justifying now much stronger emphasis prevention of intracranial arterial sclerosis and general atherosclerosis given the patient's history of young onset diabetes. He already was treated with atorvastatin he now will take a baby aspirin a day there are conflicting messages as to what he was told after the procedure and what the nurse wrote down when she summarized the findings and visit.  I think that he should take for 3 days 3 baby aspirin daily and then should have continued with an 81 mg aspirin at this time. Dr. Otto Herb wants to refer [name] this the patient's angiography in 6 months at that time with a CT angiogram to see if interval changes if any have happened he may consider a stent placement. I have shared these notes with the primary care Billee Cashing, PA with the ophthalmologist Dr. Fawn Kirk and with Dr. Sanjuan Dame the patient's endocrinologist. I will give him today an additional handout guidance with the DASH diet which is not just a cardiovascular but a general vascular health benefit, early signs of stroke and how to respond to these. I would like for him not to hesitate to present to the emergency room if symptoms of a stroke may be present. Any clumsiness weakness numbness in the right body or left face would be considered reason to come to the emergency room.   07-10-2020; RV after EMG and NCV were"  Normal",- mild chronic interossoeus denervation- no widespread neuropathy.   c spine MRI was normal - arm and shoulder soreness persisted. Vision is main concern- and he has spoken to and seen Dr Luciana Axe- who believes this is neurological. We are not meeting today for any sleepiness concern he has endorsed the Epworth sleepiness score at 5.  I record here from Dr. [name], MD reason for visit was abnormal vision changes the visit took place on 25 May 2020 at 2 PM nuclear sclerosis OU, lattice degeneration of the retinal OD, proliferation of diabetic  neuropathy neuropathy OU.  Ocular complications of uncontrolled diabetes type 2 in the past, pseudotumor cerebri history optic papillitis OU, retinal hemorrhages in the past OU.  When the patient woke up in the morning he saw black spots he saw pulsations and noticed these he also reports a circular ring around his visual field with little flashing lights some pulsations and spots of green vision green tinted  visual field.  There are also some gray spots that may reflect areas of previous laser surgery.  Both eyes dilated well that Dr. [name] had a clear view of the posterior segment and noticed possible mild disc edema on both sides mild hyperemia on the right eye a boat shaped the age the left eye was clear, scattered dot blot hemorrhages no lipid on the right side some preretinal fibrosis on the left side he made a note of panretinal photo coagulation for the right eye scattered left wrist degeneration.  The anterior eyes were intact the concern is a ganglion cell loss superiorly in the right eye this inferior scotoma he will repeat the exam in 6 weeks which would be mid September patent mid-October.  There is a new disc finding that he could compare when he could compare his photographic evidence.  Dr. [name] evaluation from 8-19 2021 showed microaneurysms but no clinically significant macular edema good laser photocoagulation of the sites of prior neovascularization superior to the nerve.  No retinal holes or tears.  Dr. Luciana Axe also quoted the observation of continuing to see a ring while grayness now it should certainly reflects his center of his vision in the right eye.  This grayness has been reported now as turning somewhat mean.  This appears to be an otic nerve insult- he sees green, lost red vision- he needs evaluation of the optic nerve - rule out compression. May need to have new spinal tap.        REVIEW OF SYSTEMS: Out of a complete 14 system review of symptoms, the patient complains only of the following symptoms, and all other reviewed systems are negative.  ALLERGIES: Allergies  Allergen Reactions  . Flonase [Fluticasone] Shortness Of Breath  . Sulfa Antibiotics     Shortness of breath, increased heart rate; "happened quite a while ago"  . Molds & Smuts Other (See Comments)    Aggravate asthma  . Sulfites Itching and Swelling  . Tetracyclines & Related Other (See Comments)     increased intracranial pressure  . Lactose Intolerance (Gi) Other (See Comments)    Upset stomach  . [place] Cough    HOME MEDICATIONS: Outpatient Medications Prior to Visit  Medication Sig Dispense Refill  . acetaZOLAMIDE (DIAMOX) 250 MG tablet Take 250 mg by mouth 2 (two) times daily.    [name] albuterol (PROAIR HFA) 108 (90 Base) MCG/ACT inhaler Inhale 2 puffs into the lungs every 6 (six) hours as needed for wheezing or shortness of breath. (Patient not taking: No sig reported) 1 Inhaler 2  . albuterol (PROVENTIL) (2.5 MG/3ML) 0.083% nebulizer solution Take 3 mLs (2.5 mg total) by nebulization every 4 (four) hours as needed for wheezing or shortness of breath. (Patient not taking: No sig reported) 75 mL 1  . ALPRAZolam (XANAX) 0.25 MG tablet Take 0.25 mg by mouth at bedtime.    [name] aspirin EC 81 MG tablet Take 162 mg by mouth daily as needed for moderate pain. Swallow whole. (Patient not taking: No sig reported)    . atorvastatin (LIPITOR) 20 MG tablet Take 20  mg by mouth daily.   0  . AUVI-Q 0.3 MG/0.3ML SOAJ injection Use as directed for life-threatening allergic reaction. (Patient not taking: Reported on 02/20/2021) 2 each 2  . budesonide (PULMICORT) 0.5 MG/2ML nebulizer solution Take 2 mLs (0.5 mg total) by nebulization as directed. (Patient not taking: No sig reported) 2 mL 3  . budesonide-formoterol (SYMBICORT) 160-4.5 MCG/ACT inhaler Inhale 2 puffs into the lungs 2 (two) times daily. (Patient not taking: No sig reported) 1 Inhaler 5  . calcium carbonate (OSCAL) 1500 (600 Ca) MG TABS tablet Take 600 mg of elemental calcium by mouth once a week.    . Continuous Blood Gluc Receiver (DEXCOM G6 RECEIVER) DEVI 1 Device by Does not apply route as directed. (Patient not taking: No sig reported) 1 each 2  . Glucagon (BAQSIMI TWO PACK) 3 MG/DOSE POWD Place 1 spray into the nose as directed. (Patient not taking: No sig reported) 2 each 3  . glucose blood test strip Use to check blood sugar up to 10x  daily (Patient not taking: No sig reported) 300 each 11  . HUMALOG 100 UNIT/ML injection Use 300 units in insulin pump every 48 hours 120 mL 1  . insulin lispro (INSULIN LISPRO) 100 UNIT/ML KwikPen Junior Inject up to 50 units daily per provider instructions (Patient not taking: No sig reported) 15 mL 11  . Insulin Pen Needle (PEN NEEDLES) 32G X 4 MM MISC Use to inject insulin 6x per day (Patient not taking: No sig reported) 200 each 5  . levocetirizine (XYZAL) 5 MG tablet Take 1 tablet (5 mg total) by mouth daily as needed for allergies. 30 tablet 5  . lipase/protease/amylase (CREON) 36000 UNITS CPEP capsule 2 pills with every meal and 1 with every snack 250 capsule 11  . losartan (COZAAR) 50 MG tablet TAKE 1 TABLET(50 MG) BY MOUTH DAILY 90 tablet 1  . Mometasone Furoate (ASMANEX HFA) 200 MCG/ACT AERO For asthma flare, begin Asmanex 200-2 puffs twice a day for 1-2 weeks or until cough and wheeze free 13 g 5  . montelukast (SINGULAIR) 10 MG tablet TAKE 1 TABLET BY MOUTH EVERY NIGHT AT BEDTIME 30 tablet 5  . Multiple Vitamin (MULTIVITAMIN WITH MINERALS) TABS tablet Take 1 tablet by mouth daily. (Patient not taking: No sig reported)    . NUCALA 100 MG/ML SOAJ INJECT 100MG  SUBCUTANEOUSLY EVERY 30 DAYS (GIVEN AT MD  OFFICE) 1 mL 11  . topiramate (TOPAMAX) 50 MG tablet TAKE 1 TABLET(50 MG) BY MOUTH TWICE DAILY (Patient taking differently: Take 50 mg by mouth 2 (two) times daily.) 60 tablet 5  . Vitamin D, Ergocalciferol, (DRISDOL) 1.25 MG (50000 UNIT) CAPS capsule Take 50,000 Units by mouth every 7 (seven) days.     Facility-Administered Medications Prior to Visit  Medication Dose Route Frequency Provider Last Rate Last Admin  . gadopentetate dimeglumine (MAGNEVIST) injection 20 mL  20 mL Intravenous Once PRN Dohmeier, Porfirio Mylararmen, MD      . Mepolizumab SOLR 100 mg  100 mg Subcutaneous Q28 days Alfonse SpruceGallagher, Joel Louis, MD   100 mg at 02/16/19 1018    PAST MEDICAL HISTORY: Past Medical History:  Diagnosis  Date  . ADHD (attention deficit hyperactivity disorder)   . Anxiety   . Asthma   . Autonomic neuropathy due to diabetes (HCC)   . Chronic headaches   . Diabetes (HCC)   . Fatigue   . GERD (gastroesophageal reflux disease)   . Goiter   . High blood pressure   . High  cholesterol   . Hypercholesterolemia   . Hypertension   . Hypoglycemia associated with diabetes (HCC)   . Malnutrition (HCC)   . Obesity   . Sleep apnea    mild, no cpap  . Tachycardia   . Type 1 diabetes mellitus not at goal [facility])   . Uncontrolled DM with microalbuminuria or microproteinuria     PAST SURGICAL HISTORY: Past Surgical History:  Procedure Laterality Date  . EYE SURGERY Right 05/30/2020   Vitrectomy, Dr. Luciana Axe  . EYE SURGERY  04/2020  . IR 3D INDEPENDENT WKST  07/16/2020  . IR ANGIO INTRA EXTRACRAN SEL COM CAROTID INNOMINATE BILAT MOD SED  07/16/2020  . IR ANGIO VERTEBRAL SEL SUBCLAVIAN INNOMINATE UNI L MOD SED  07/16/2020  . IR ANGIO VERTEBRAL SEL VERTEBRAL UNI R MOD SED  07/16/2020  . IR US GUIDE VASC ACCESS RIGHT  07/16/2020  . REFRACTIVE SURGERY     x9  . WISDOM TOOTH EXTRACTION      FAMILY HISTORY: Family History  Problem Relation Age of Onset  . Dementia Paternal Aunt   . Schizophrenia Paternal Aunt   . Cancer Maternal Grandmother        type unknown  . Diabetes Maternal Grandmother        T2 DM  . Cancer Maternal Grandfather        type unknown  . Irritable bowel syndrome Mother   . Heart disease Father   . Hypertension Father   . Hyperlipidemia Father   . Skin cancer Father   . Thyroid disease Sister   . Stomach cancer Paternal Aunt   . Pancreatic cancer Paternal Aunt   . Diabetes Maternal Aunt   . Allergic rhinitis Neg Hx   . Angioedema Neg Hx   . Asthma Neg Hx   . Atopy Neg Hx   . Eczema Neg Hx   . Immunodeficiency Neg Hx   . Urticaria Neg Hx   . Esophageal cancer Neg Hx   . Colon cancer Neg Hx   . Colon polyps Neg Hx     SOCIAL HISTORY: Social History    Socioeconomic History  . Marital status: Single    Spouse name: Not on file  . Number of children: 0  . Years of education: Not on file  . Highest education level: Not on file  Occupational History  . Occupation: Geophysical data processor  Tobacco Use  . Smoking status: Never Smoker  . Smokeless tobacco: Never Used  Vaping Use  . Vaping Use: Never used  Substance and Sexual Activity  . Alcohol use: No  . Drug use: No  . Sexual activity: Not on file  Other Topics Concern  . Not on file  Social History Narrative   ** Merged History Encounter **       Social Determinants of Health   Financial Resource Strain: Not on file  Food Insecurity: Not on file  Transportation Needs: Not on file  Physical Activity: Not on file  Stress: Not on file  Social Connections: Not on file  Intimate Partner Violence: Not on file      PHYSICAL EXAM  There were no vitals filed for this visit. There is no height or weight on file to calculate BMI.  Generalized: Well developed, in no acute distress   Neurological examination  Mentation: Alert oriented to time, place, history taking. Follows all commands speech and language fluent Cranial nerve II-XII: Pupils were equal round reactive to light. Extraocular movements were full, visual field were  full on confrontational test.Head turning and shoulder shrug  were normal and symmetric. Motor: The motor testing reveals 5 over 5 strength of all 4 extremities. Good symmetric motor tone is noted throughout.  Sensory: Sensory testing is intact to soft touch on all 4 extremities. No evidence of extinction is noted.  Coordination: Cerebellar testing reveals good finger-nose-finger and heel-to-shin bilaterally.  Gait and station: Gait is normal.  Reflexes: Deep tendon reflexes are symmetric and normal bilaterally.   DIAGNOSTIC DATA (LABS, IMAGING, TESTING) - I reviewed patient records, labs, notes, testing and imaging myself where available.  Lab  Results  Component Value Date   WBC 6.5 01/30/2021   HGB 16.3 01/30/2021   HCT 46.9 01/30/2021   MCV 89.8 01/30/2021   PLT 252 01/30/2021      Component Value Date/Time   NA 137 01/30/2021 1933   NA 142 11/29/2018 1635   K 4.0 01/30/2021 1933   CL 107 01/30/2021 1933   CO2 22 01/30/2021 1933   GLUCOSE 308 (H) 01/30/2021 1933   BUN 17 01/30/2021 1933   BUN 14 11/29/2018 1635   CREATININE 0.87 01/30/2021 1933   CREATININE 0.89 12/30/2018 1146   CALCIUM 9.3 01/30/2021 1933   PROT 6.9 01/30/2021 1933   PROT 7.2 11/29/2018 1635   ALBUMIN 4.2 01/30/2021 1933   ALBUMIN 4.7 11/29/2018 1635   AST 17 01/30/2021 1933   ALT 38 01/30/2021 1933   ALKPHOS 70 01/30/2021 1933   BILITOT 0.7 01/30/2021 1933   BILITOT 0.4 11/29/2018 1635   GFRNONAA >60 01/30/2021 1933   GFRAA >60 07/16/2020 0710   Lab Results  Component Value Date   CHOL 197 01/24/2021   HDL 40 01/24/2021   LDLCALC 135 (H) 01/24/2021   TRIG 111 01/24/2021   CHOLHDL 4.9 01/24/2021   Lab Results  Component Value Date   HGBA1C 6.3 (A) 01/30/2021   Lab Results  Component Value Date   VITAMINB12 451 12/30/2018   Lab Results  Component Value Date   TSH 1.23 01/30/2021      ASSESSMENT AND PLAN 40 y.o. year old male  has a past medical history of ADHD (attention deficit hyperactivity disorder), Anxiety, Asthma, Autonomic neuropathy due to diabetes (HCC), Chronic headaches, Diabetes (HCC), Fatigue, GERD (gastroesophageal reflux disease), Goiter, High blood pressure, High cholesterol, Hypercholesterolemia, Hypertension, Hypoglycemia associated with diabetes (HCC), Malnutrition (HCC), Obesity, Sleep apnea, Tachycardia, Type 1 diabetes mellitus not at goal [facility]), and Uncontrolled DM with microalbuminuria or microproteinuria. here with:  1.  Cerebral atherosclerosis--stenosis of left internal intracranial carotid artery  --Discussed with Dr. Vickey Huger patient was encouraged to restart aspirin 81 mg EC every other day --  Agree with second opinion from neurologist at [facility]  2.  Pseudotumor cerebri  --Encouraged to continue follow-up with neuro-ophthalmology -- Currently on Diamox 250 mg twice a day and Topamax 50 mg twice a day (this is currently not prescribed by our office)    I spent 32 minutes of face-to-face and non-face-to-face time with patient.  This included previsit chart review reviewing note from neuro-ophthalmology and discussion of plan of care with Dr. Vergia Alcon, MSN, NP-C 02/21/2021, 9:03 AM [facility] [address], [address] [place], [place] [address] [phone]

## 2021-02-26 ENCOUNTER — Ambulatory Visit (INDEPENDENT_AMBULATORY_CARE_PROVIDER_SITE_OTHER): Payer: 59 | Admitting: Gastroenterology

## 2021-02-26 ENCOUNTER — Encounter: Payer: Self-pay | Admitting: Gastroenterology

## 2021-02-26 VITALS — BP 100/72 | HR 100 | Ht 71.0 in | Wt 210.0 lb

## 2021-02-26 DIAGNOSIS — K8689 Other specified diseases of pancreas: Secondary | ICD-10-CM

## 2021-02-26 MED ORDER — PANCRELIPASE (LIP-PROT-AMYL) 36000-114000 UNITS PO CPEP: ORAL_CAPSULE | ORAL | 11 refills | Status: AC

## 2021-02-26 MED ORDER — OMEPRAZOLE 20 MG PO CPDR: 20.0000 mg | DELAYED_RELEASE_CAPSULE | Freq: Every day | ORAL | 11 refills | Status: AC

## 2021-02-26 NOTE — Progress Notes (Signed)
Review of pertinent gastrointestinal problems: 1.  Nausea, vomiting, poor appetite led to GI testing including EGD April 2022: I found mild gastritis which was negative for H. pylori infection, the examination was otherwise normal, I biopsied his duodenum to check for celiac sprue.  Pathology showed no sign of celiac sprue.  MRI of abdomen showed atrophic pancreas and his pancreatic fecal elastase was very low at 76.  This was in the setting of longstanding diabetes and likely indicated significant pancreatic exocrine insufficiency   HPI: This is a 40 year old man whom I last saw the time of EGD, see that summary above from 1 month ago.  He has been taking Creon 36,000, 2 pills with every meal and 1 pill with every snack.  His weight is down 3 pounds since his last office visit here about 1 month ago.  He is very weak and fatigued.  He does feel like his blood sugars are higher than they were prior to starting the Creon.  He tells me he has better "I guess"  His mood is depressed   ROS: complete GI ROS as described in HPI, all other review negative.  Constitutional:  No unintentional weight loss   Past Medical History:  Diagnosis Date  . ADHD (attention deficit hyperactivity disorder)   . Anxiety   . Asthma   . Autonomic neuropathy due to diabetes (HCC)   . Chronic headaches   . Diabetes (HCC)   . Fatigue   . GERD (gastroesophageal reflux disease)   . Goiter   . High blood pressure   . High cholesterol   . Hypercholesterolemia   . Hypertension   . Hypoglycemia associated with diabetes (HCC)   . Malnutrition (HCC)   . Obesity   . Sleep apnea    mild, no cpap  . Tachycardia   . Type 1 diabetes mellitus not at goal [facility])   . Uncontrolled DM with microalbuminuria or microproteinuria     Past Surgical History:  Procedure Laterality Date  . EYE SURGERY Right 05/30/2020   Vitrectomy, Dr. Luciana Axe  . EYE SURGERY  04/2020  . IR 3D INDEPENDENT WKST  07/16/2020  . IR ANGIO INTRA  EXTRACRAN SEL COM CAROTID INNOMINATE BILAT MOD SED  07/16/2020  . IR ANGIO VERTEBRAL SEL SUBCLAVIAN INNOMINATE UNI L MOD SED  07/16/2020  . IR ANGIO VERTEBRAL SEL VERTEBRAL UNI R MOD SED  07/16/2020  . IR US GUIDE VASC ACCESS RIGHT  07/16/2020  . REFRACTIVE SURGERY     x9  . WISDOM TOOTH EXTRACTION      Current Outpatient Medications  Medication Sig Dispense Refill  . acetaZOLAMIDE (DIAMOX) 250 MG tablet Take 250 mg by mouth 2 (two) times daily.    [name] albuterol (PROAIR HFA) 108 (90 Base) MCG/ACT inhaler Inhale 2 puffs into the lungs every 6 (six) hours as needed for wheezing or shortness of breath. 1 Inhaler 2  . albuterol (PROVENTIL) (2.5 MG/3ML) 0.083% nebulizer solution Take 3 mLs (2.5 mg total) by nebulization every 4 (four) hours as needed for wheezing or shortness of breath. 75 mL 1  . ALPRAZolam (XANAX) 0.25 MG tablet Take 0.25 mg by mouth at bedtime.    [name] aspirin EC 81 MG tablet Take 162 mg by mouth daily as needed for moderate pain. Swallow whole.    [name] atorvastatin (LIPITOR) 20 MG tablet Take 20 mg by mouth daily.   0  . AUVI-Q 0.3 MG/0.3ML SOAJ injection Use as directed for life-threatening allergic reaction. 2 each 2  .  budesonide (PULMICORT) 0.5 MG/2ML nebulizer solution Take 2 mLs (0.5 mg total) by nebulization as directed. 2 mL 3  . budesonide-formoterol (SYMBICORT) 160-4.5 MCG/ACT inhaler Inhale 2 puffs into the lungs 2 (two) times daily. 1 Inhaler 5  . calcium carbonate (OSCAL) 1500 (600 Ca) MG TABS tablet Take 600 mg of elemental calcium by mouth once a week.    . Continuous Blood Gluc Receiver (DEXCOM G6 RECEIVER) DEVI 1 Device by Does not apply route as directed. 1 each 2  . Glucagon (BAQSIMI TWO PACK) 3 MG/DOSE POWD Place 1 spray into the nose as directed. 2 each 3  . glucose blood test strip Use to check blood sugar up to 10x daily 300 each 11  . HUMALOG 100 UNIT/ML injection Use 300 units in insulin pump every 48 hours 120 mL 1  . insulin lispro (INSULIN LISPRO) 100 UNIT/ML  KwikPen Junior Inject up to 50 units daily per provider instructions 15 mL 11  . Insulin Pen Needle (PEN NEEDLES) 32G X 4 MM MISC Use to inject insulin 6x per day 200 each 5  . levocetirizine (XYZAL) 5 MG tablet Take 1 tablet (5 mg total) by mouth daily as needed for allergies. 30 tablet 5  . lipase/protease/amylase (CREON) 36000 UNITS CPEP capsule 2 pills with every meal and 1 with every snack 250 capsule 11  . losartan (COZAAR) 50 MG tablet TAKE 1 TABLET(50 MG) BY MOUTH DAILY 90 tablet 1  . Mometasone Furoate (ASMANEX HFA) 200 MCG/ACT AERO For asthma flare, begin Asmanex 200-2 puffs twice a day for 1-2 weeks or until cough and wheeze free 13 g 5  . montelukast (SINGULAIR) 10 MG tablet TAKE 1 TABLET BY MOUTH EVERY NIGHT AT BEDTIME 30 tablet 5  . Multiple Vitamin (MULTIVITAMIN WITH MINERALS) TABS tablet Take 1 tablet by mouth daily.    [name] NUCALA 100 MG/ML SOAJ INJECT 100MG  SUBCUTANEOUSLY EVERY 30 DAYS (GIVEN AT MD  OFFICE) 1 mL 11  . topiramate (TOPAMAX) 50 MG tablet TAKE 1 TABLET(50 MG) BY MOUTH TWICE DAILY (Patient taking differently: Take 50 mg by mouth 2 (two) times daily.) 60 tablet 5  . Vitamin D, Ergocalciferol, (DRISDOL) 1.25 MG (50000 UNIT) CAPS capsule Take 50,000 Units by mouth every 7 (seven) days.     Current Facility-Administered Medications  Medication Dose Route Frequency Provider Last Rate Last Admin  . Mepolizumab SOLR 100 mg  100 mg Subcutaneous Q28 days , MD   100 mg at 02/16/19 1018   Facility-Administered Medications Ordered in Other Visits  Medication Dose Route Frequency Provider Last Rate Last Admin  . gadopentetate dimeglumine (MAGNEVIST) injection 20 mL  20 mL Intravenous Once PRN Dohmeier, 02/18/19, MD        Allergies as of 02/26/2021 - Review Complete 02/26/2021  Allergen Reaction Noted  . Flonase [fluticasone] Shortness Of Breath 06/10/2017  . Sulfa antibiotics  12/10/2020  . Molds & smuts Other (See Comments) 03/09/2018  . Sulfites Itching  and Swelling 02/04/2018  . Tetracyclines & related Other (See Comments) 03/09/2018  . Lactose intolerance (gi) Other (See Comments) 01/05/2021  . White birch Cough 04/10/2020    Family History  Problem Relation Age of Onset  . Dementia Paternal Aunt   . Schizophrenia Paternal Aunt   . Cancer Maternal Grandmother        type unknown  . Diabetes Maternal Grandmother        T2 DM  . Cancer Maternal Grandfather        type unknown  .  Irritable bowel syndrome Mother   . Heart disease Father   . Hypertension Father   . Hyperlipidemia Father   . Skin cancer Father   . Thyroid disease Sister   . Stomach cancer Paternal Aunt   . Pancreatic cancer Paternal Aunt   . Diabetes Maternal Aunt   . Allergic rhinitis Neg Hx   . Angioedema Neg Hx   . Asthma Neg Hx   . Atopy Neg Hx   . Eczema Neg Hx   . Immunodeficiency Neg Hx   . Urticaria Neg Hx   . Esophageal cancer Neg Hx   . Colon cancer Neg Hx   . Colon polyps Neg Hx     Social History   Socioeconomic History  . Marital status: Single    Spouse name: Not on file  . Number of children: 0  . Years of education: Not on file  . Highest education level: Not on file  Occupational History  . Occupation: Geophysical data processor  Tobacco Use  . Smoking status: Never Smoker  . Smokeless tobacco: Never Used  Vaping Use  . Vaping Use: Never used  Substance and Sexual Activity  . Alcohol use: No  . Drug use: No  . Sexual activity: Not on file  Other Topics Concern  . Not on file  Social History Narrative   ** Merged History Encounter **       Social Determinants of Health   Financial Resource Strain: Not on file  Food Insecurity: Not on file  Transportation Needs: Not on file  Physical Activity: Not on file  Stress: Not on file  Social Connections: Not on file  Intimate Partner Violence: Not on file     Physical Exam: BP 100/72   Pulse 100   Ht 5\' 11"  (1.803 m)   Wt 210 lb (95.3 kg)   BMI 29.29 kg/m  Constitutional:  generally well-appearing, depressed affect Psychiatric: alert and oriented x3 Abdomen: soft, nontender, nondistended, no obvious ascites, no peritoneal signs, normal bowel sounds No peripheral edema noted in lower extremities  Assessment and plan: 40 y.o. male with pancreatic insufficiency, likely from longstanding diabetes  I am not sure that his Creon dosing is high enough for him and so he will change his dosing to 3 pills with every meal and 2 pills with every snack, 36,000 unit pills still.  He is also going to begin taking his omeprazole on an every day basis rather than just once in a while.  He will return to see me in 2 months and sooner if needed.  I am going to check to see if there are any other dietitians available in the area who might have more expertise in advising him on pancreatic insufficiency recovery diets  Please see the "Patient Instructions" section for addition details about the plan.  25, MD [facility] 02/26/2021, 3:03 PM   Total time on date of encounter was 30 minutes (this included time spent preparing to see the patient reviewing records; obtaining and/or reviewing separately obtained history; performing a medically appropriate exam and/or evaluation; counseling and educating the patient and family if present; ordering medications, tests or procedures if applicable; and documenting clinical information in the health record).

## 2021-02-26 NOTE — Progress Notes (Signed)
Subjective:  Patient Name: [name] Date of Birth: 1981-01-12  MRN: [id number]  [name]  presents at his clinic visit today for follow-up of his type 1 diabetes mellitus, goiter, obesity, combined hyperlipidemia, adult ADHD, fatigue, autonomic neuropathy, tachycardia, hypertension, microalbuminuria, hypoglycemia, sinusitis, bronchitis, allergies, proliferative retinopathy, pseudotumor cerebri/increased intracranial hypertension, and unintentional weight loss.[name]   HISTORY OF PRESENT ILLNESS:   [name] is a 40 y.o. Caucasian gentleman. [name] was unaccompanied.  1. The patient was first referred to me on 01/08/2006 by his family physician, Dr. Andreas [name], for evaluation and management of type 1 diabetes mellitus and related problems. The patient was 40 years old.  A. [name] had been diagnosed with type 1 diabetes somewhere in 2001-2002, at the age of 37-19. He was initially diagnosed with type 2 diabetes mellitus, but was later re-classified as type 1 diabetes mellitus. He had been followed by Dr. Romilda [name], staff endocrinologist at [name]. [name] was started on an insulin pump approximately 18 months prior to first seeing me. His pump was a Medtronic Paradigm 712. His blood glucose control was fair-to-poor. He was frequently not checking blood sugars or taking insulin boluses as often as he needed to. He frequently noted fast heart rate. The patient's past medical history was positive for hypertension, for which he was taking lisinopril, and for what his mother called "extreme ADHD". He had previously been taking ADHD medicines but had discontinued them due to adverse effects. He had prior ankle injuries and knee injuries, to include tears of the lateral menisci bilaterally. He had not had any surgeries. 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 As a result, the patient was on the road a lot and spent  much of his time outdoors at field sites. He did not use tobacco or drugs, but did drink alcohol occasionally.  B. On physical examination, his weight was 234 pounds, his height was 70 inches, his BMI was 33.2. Blood pressure was 128/70. Hemoglobin A1c was 9.5%. Heart rate was 96. He had normal affect and fair insight. He had a 25-30 gram goiter. He had normal 1+ DP pulses in his feet. He had normal sensation in his feet to touch, vibration, and monofilament. His CMP was normal except for glucose of 251. His cholesterol was 257, triglycerides 143, HDL 49, and LDL 179. TSH was 1.017, free T4 was 1.06, and free T3 was 3.3. His urinary microalbumin: creatinine ratio was 20.1 (normal less than 30).   C. The patient clearly needed better blood glucose control, which would only occur if he had better adherence to his plan. He appeared to have autonomic neuropathy, manifested by inappropriate sinus tachycardia.  His thyroid goiter suggested that he might have evolving Hashimoto's disease. He stated that he had lost some weight recently. I encouraged him to check his blood sugars more frequently and to take both correction boluses and food boluses.  2. During the past fifteen years, [name] has had to deal with many different problems:  A. T1DM/hypoglycemia:    1). 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 His hemoglobin A1c values had varied from 8.3-11.2%.   2). After starting him on a Medtronic 670G insulin pump and Guardian 3 sensor on 01/12/17, his BG control has improved significantly, despite the illnesses  described below.   B. DM complications: .   1). He has bilateral diabetic retinopathy and is followed by his retinologist, Dr. Deloria Lair, MD.     A). He saw Dr. Zadie [name] in October 2020 and again in February 2021. There were no new signs of diabetic  eye disease.     2). At [name] visit with me in July 2021 he asked me to arrange a second opinion retinal consultation for him. I arranged for him to see Dr. Dennard [name] in the [name].    2). He has autonomic neuropathy manifested by inappropriate sinus tachycardia and gastroparesis causing postprandial bloating and GERD. These problems have varied inversely with his degree of BG control.   C. Combined hyperlipidemia:     1). On 12/03/10 his total cholesterol was 270, triglycerides 94, HDL 50, and LDL 201. I started him on Crestor, 10 mg per day. He has since begun treatment with atorvastatin and his cholesterol values had improved.    2). On 12/29/16 his lipids had improved, but his cholesterol values were still elevated. After developing the illnesses noted below, his atorvastatin was discontinued due to concerns that he might be having adverse reactions to the atorvastatin.    D. Allergies, sinusitis, bronchitis, headaches, muscle pains/spasms:   1). Unfortunately, beginning in about June 2018, [name] began to develop recurrent episodes of allergic rhinitis, head fullness, sinusitis, and bronchitis. He also developed headaches, muscle pains and spasms, lethargy, fatigue, and weakness that were presumably due in large part to severe allergies. His atorvastatin and several other medications were stopped at that time. Despite receiving excellent care from his allergist, to include allergy injections and biologic injections, [name]'s allergic issues persisted.    2). He saw Dr. Carmelina [name] on 03/15/19 and again in February 2021. [name]'s allergies and asthma have been okay since has been wearing his covid mask. He has not needed steroids recently. His second sleep study was not bad enough to start on C-pap. In the past 8-10 months these illnesses/problems have largely resolved, in part due to taking a monthly biologic allergy injection.   E. Increased intracranial pressure:    1). [name] was seen in consultation by Dr.  Larey [name], neurology, on 05/28/17 for headaches, tremors of his right hand and foot, shaky vision, and symptoms c/w OSA. His initial sleep study did not show any significant amount of OSA.    2). On 03/08/18, JosephDohmeier was notified that [place] eye doctor had seen signs of fluid build up in his right eye. An LP was performed in radiology on 03/09/18. Opening pressure was 28 cm of water, c/w increased intracranial pressure and pseudotumor cerebri. This problem was initially though to be due to tetracycline usage, so TCN was discontinued and Diamox begun. The Diamox doses have been changed over time.    3). On 01/03/19 he had an LP that showed elevated pressure. His diamox dose was increased somewhat. Dr. Brett Fairy also started him on Topamax. He had a follow up visit with Dr. Brett Fairy on 02/23/20. She continued him on low doses of both medications. He still has some adverse effects of the Diamox, but not as bad. He still has some mild fatigue.  G. Adult ADD/depression: [name] was evaluated by Dr Carlena Hurl, a noted neuropsychiatrist in [place] in 2018. At his last follow up visit in November 2018, Dr. Wylene Simmer decided to wait until [name]'s overall health situation  stabilized before trying any new medications.   H. General physical and emotional health: [name]  has been very sick for much of the past 3 years. He has been unable to work full-time and has become almost confined to his home. He has become increasingly anhedonic over time.   I. Neck pains, decreased cervical range of motion, and shoulder girdle pains:   1). Several months ago he began to develop pains in his left upper chest and shoulder area. He saw Dr. Oneida Alar. The pains then went into his right upper chest and shoulder, then into his shoulder blades. Now he can't bend his neck. [place] of his left shoulder was negative.    2). He missed his follow up appointment with Dr. Oneida Alar due to other conflicting medical appointments. I asked him to  re-schedule that appointment.  J. He had an MRI and an MRA of his brain. The MRI of the brain and brainstem down to C3 was normal. The MRA showed the following:  Moderate stenosis of the M1 segment of the left middle cerebral artery.  Negligible flow through the A1 segment of the left anterior cerebral artery.  This could be due to a hypoplastic/aplastic segment, a normal variant.  This could also be due to severe stenosis.  The left A2 segment derived this flow from the right to through the anterior communicating artery a "moderate stenosis of the M1 segment of the left middle cerebral artery: He then had an angiography procedure to evaluate his bilateral carotid  And innominate arterial circulation. That study showed: Approximately 65-70% stenosis of the left internal carotid artery supraclinoid segment secondary to a mildly irregular intracranial arteriosclerotic plaque. No opacification of the left anterior cerebral A1 segment most likely represents a pathologic intracranial occlusion. 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 Approximately 10 mm x 8.3 mm pyramidal configured high-riding jugular bulb. [name] was told to" take 325 mg of ASA daily as a secondary stroke prevention strategy".  K. Following the angiography procedure he developed bilateral palmar hand numbness. Fortunately, these symptoms are improving   L. He has an asthma consultation with Ms Juliann Pares, [place], on 07/24/20. He had mild restrictive disease, essentially unchanged.  M. He had an evaluation by Dr. Celine Ahr, [facility], on 06/19/20 and a follow up visit on 10.01/21. The cardiologist recommended continuing his  ASA and statin therapy, but did not feel any cardiology follow up was needed.   N. As of 02/02/21 [name] was diagnosed with pancreatic exocrine insufficiency.    3. [name]'s  last [facility] visit was on 01/30/2021. At that visit I saw him as an urgent add-on because Dr. Karlton Lemon saw [name] in the [facility] that day and thought that [name] looked bad and needed to be admitted. When I saw [name] I agreed. I contacted the Internal Medicine Teaching Service and had [place] admitted for evaluation and management.   A. During that admission from 01/30/21-02/02/21, [name] was thoroughly evaluated. TSH was 1.23, free T4 1.1, free T3 3.6; 25-OH vitamin D was 51.    1). To rule out autoimmune adrenal insufficiency (Addison's disease), two sets of ACTH and cortisol values were performed. On 01/30/21 at 1:56 PM, his ACTH was 23 (ref 6-50 in the AM) and his cortisol was 16.5 (ref 4-22 at 8 AM, 3-17 at 4 PM). On 01/31/21 at 7:24 AM his ACTH was 29.9 (ref 7.2-63.3 in the AM) and his cortisol was 6.5 (ref 6.7-22.6  in the AM). Although the ACTH values were well within normal limits, which would tend to exclude adrenal cortical insufficiency, he had one normal cortisol and one low cortisol. To further evaluate his hypothalamic-pituitary-adrenal (HPA) axis, an ACTH stimulation test was performed on 02/01/21 at 8:21 AM. Baseline cortisol was normal at 8.9. +30 minute cortisol was normal at 21.6. +60 minute cortisol was normal at 24.6. This very brisk and normal response ruled out adrenal insufficiency.    2). To rule out celiac disease, a tissue transglutaminase IgA and IgA were drawn. The tTG IgA was normal at <1.0. The IgA was normal at 136 (ref 47-310). Dr. Christella Hartigan also performed an endoscopy on 01/28/21 and took biopsies of the small stomach and small intestine. The gastric biopsies were negative for H. Pylori and the small intestinal biopsies were negative for celiac disease.                   3). To rule out an extrapancreatic insulinoma, a C-peptide was obtained on 01/30/21. The value was very low at <0.1 (ref 1.1-4.4), c/w his autoimmune T1DM.              4). To rule out a pancreatic tumor, an  abdominal MRI was performed with and without contrast on 01/31/21. The pancreas was "moderately thin/atrophic", similar to a CT scan performed on 03/10/18. "There was no abnormal pancreatic parenchymal enhancement.   5). To rule out exocrine pancreas insufficiency, three tests were performed on 01/31/21.  The serum lipase was normal at 22 (ref 11-51). The serum amylase was low at 23 (ref 28-100). The stool pancreatic elastase was markedly low at 76 (ref >200). The low serum amylase and the very low stool elastase confirmed the clinical suspicion of pancreatic exocrine deficiency. Unfortunately, we do not know the cause of this exocrine insufficiency.    6). [name] was started on Creon and discharged on 02/02/21. His initial Creon dose was one 36,000 unit capsule by mouth with meals.   B. In the interim, [name] has still been feeling "pretty rough", but maybe a little better.   1). His BGs are generally higher, but he has had BGs down to 42.  He continues to have site issues due to his loss of fat weight. He only has the metal sites now, so he is having difficulty using his buttocks.    2). He is able to eat more normally now, but is afraid to eat much at any one time, so he is still losing weight. He consciously eats more at lunch and less at other meals. He is consciously taking in more protein and increasing the carbs at lunch and some other meals to 30-35 grams. He is not having much abdominal bloating. He is not having any diarrhea, but does have occasional abdominal cramps. He feels like he is still losing strength. His joints still hurt.   3). He met with Dr. Christella Hartigan in GI yesterday. Dr. Christella Hartigan increased the Creon doses to 3 with meals and 2 with snacks.   C. On 02/12/21 he had a CT/MRA scan of his head and neck. The brain was normal. He had mild soft plaque at the ICA bulb. No stenosis was seen. He had a 50% stenosis of the left supraclinoid ICA. There was no other intracranial large of medium vessel occlusion  of correctable proximal stenosis. No patent A1 segment was seen on the left. Both anterior cerebral arteries receive their blood supply from the right carotid circulation.  Marthann Schiller is followed by [facility] for Idiopathic Intracranial Hypertension (IIH). He saw his neurologist at [facility] on 10/15/20. MRA of the brain did not add any additional info. [name] had a follow up visit on 01/03/21.  E. [name] was previously found to have vitamin D level of 10. He is now taking 50,00 IU/week.   F. He continues to see his therapist, Ms. [facility], every week. He is using alprazolam, 0.25 mg at bedtime when needed for anxiety. His PCP will prescribe the medication for him.  He thinks the medication and the therapy are helping him.   G. His nausea only occurs occasionally. He does not usually take omeprazole.   H. He continues to have problems with lactose intolerance. When he avoids lactose, however, he is asymptomatic. Some foods such as shellfish cause his throat to swell up.   I. He takes losartan, 50 mg/day. He rarely has any coughing unless his allergies act up. He also takes atorvastatin, 20 mg/day.    J. He had an ophthalmology appointment at [name] 10/23/20. He saw the [name] neuro-retinal specialist on 12/13/20.  There was no evidence of progression.   K. His insurance plan will not allow him to have any more than 200 BG test strips per month.   4. Pertinent Review of Systems: Constitutional: [name] feels "tired, worn out, and weak". His sleep quality is "not great, but better".  Eyes: As above. He still has the "sparkles" in his eyes essentially all the time. The visual color changes, muscle spasms, and tingling that were attributed to Diamox still bother him, but less since reducing the Diamox dose. Neck: He has not had any recent anterior neck swelling and tenderness.  Lungs: Breathing has been "okay", but he still sometimes has SOB with exertion.  Heart: He thinks his heart beats about the  same. Gastrointestinal: As above. Bowel movements have been better. Hands: All of his joints hurt.   Legs: As above.  Muscle mass and strength seem lower. There are no other complaints of numbness, tingling, burning, or pain. No edema is noted. Feet: There are more pains in his soles. There are no complaints of numbness, or burning.  No edema is noted. Hypoglycemia: He has had some low BG alerts.  Emotional: "I'm worn out."    5. Pump printout: He changes sites every 1-2 days. Average BG is 185, compared with 167 at his last visit and with 156 at his prior visit.  He checks BGs 8-14 times per day. BG range was about 145-216, compared with 90-289 at his last visit.    6. CGM printout: He has been in auto mode 98%. He wears his sensor 96%. Average SG is 159, compared with 154 at his last visit and with 155 at his last prior. Time in Range is 73%, compared with 80% at his last visit and with 79% at his prior visit. Time above range is 24%, compared with 20% at his last visit. SGs average about 180 at midnight. SGs decrease to about 100 about 10 AM to 2 PM, then increased to about 230 when he has a bigger lunch. SGS are about 160 in the evening.   PAST MEDICAL, FAMILY, AND SOCIAL HISTORY:  Past Medical History:  Diagnosis Date  . ADHD (attention deficit hyperactivity disorder)   . Anxiety   . Asthma   . Autonomic neuropathy due to diabetes ([place])   . Chronic headaches   . Diabetes ([place])   . Fatigue   .  GERD (gastroesophageal reflux disease)   . Goiter   . High blood pressure   . High cholesterol   . Hypercholesterolemia   . Hypertension   . Hypoglycemia associated with diabetes ([place])   . Malnutrition ([place])   . Obesity   . Sleep apnea    mild, no cpap  . Tachycardia   . Type 1 diabetes mellitus not at goal [name])   . Uncontrolled DM with microalbuminuria or microproteinuria     Family History  Problem Relation Age of Onset  . Dementia Paternal Aunt   . Schizophrenia Paternal Aunt    . Cancer Maternal Grandmother        type unknown  . Diabetes Maternal Grandmother        T2 DM  . Cancer Maternal Grandfather        type unknown  . Irritable bowel syndrome Mother   . Heart disease Father   . Hypertension Father   . Hyperlipidemia Father   . Skin cancer Father   . Thyroid disease Sister   . Stomach cancer Paternal Aunt   . Pancreatic cancer Paternal Aunt   . Diabetes Maternal Aunt   . Allergic rhinitis Neg Hx   . Angioedema Neg Hx   . Asthma Neg Hx   . Atopy Neg Hx   . Eczema Neg Hx   . Immunodeficiency Neg Hx   . Urticaria Neg Hx   . Esophageal cancer Neg Hx   . Colon cancer Neg Hx   . Colon polyps Neg Hx      Current Outpatient Medications:  .  acetaZOLAMIDE (DIAMOX) 250 MG tablet, Take 250 mg by mouth 2 (two) times daily., Disp: , Rfl:  .  ALPRAZolam (XANAX) 0.25 MG tablet, Take 0.25 mg by mouth at bedtime., Disp: , Rfl:  .  atorvastatin (LIPITOR) 20 MG tablet, Take 20 mg by mouth daily. , Disp: , Rfl: 0 .  calcium carbonate (OSCAL) 1500 (600 Ca) MG TABS tablet, Take 600 mg of elemental calcium by mouth once a week., Disp: , Rfl:  .  HUMALOG 100 UNIT/ML injection, Use 300 units in insulin pump every 48 hours, Disp: 120 mL, Rfl: 1 .  lipase/protease/amylase (CREON) 36000 UNITS CPEP capsule, 3 pills with every meal and 2 pills with every snack, Disp: 390 capsule, Rfl: 11 .  losartan (COZAAR) 50 MG tablet, TAKE 1 TABLET(50 MG) BY MOUTH DAILY, Disp: 90 tablet, Rfl: 1 .  montelukast (SINGULAIR) 10 MG tablet, TAKE 1 TABLET BY MOUTH EVERY NIGHT AT BEDTIME, Disp: 30 tablet, Rfl: 5 .  NUCALA 100 MG/ML SOAJ, INJECT $RemoveBefo'100MG'[id number]$  SUBCUTANEOUSLY EVERY 30 DAYS (GIVEN AT MD  OFFICE), Disp: 1 mL, Rfl: 11 .  omeprazole (PRILOSEC) 20 MG capsule, Take 1 capsule (20 mg total) by mouth daily., Disp: 30 capsule, Rfl: 11 .  Vitamin D, Ergocalciferol, (DRISDOL) 1.25 MG (50000 UNIT) CAPS capsule, Take 50,000 Units by mouth every 7 (seven) days., Disp: , Rfl:  .  albuterol (PROAIR HFA)  108 (90 Base) MCG/ACT inhaler, Inhale 2 puffs into the lungs every 6 (six) hours as needed for wheezing or shortness of breath. (Patient not taking: Reported on 02/27/2021), Disp: 1 Inhaler, Rfl: 2 .  albuterol (PROVENTIL) (2.5 MG/3ML) 0.083% nebulizer solution, Take 3 mLs (2.5 mg total) by nebulization every 4 (four) hours as needed for wheezing or shortness of breath. (Patient not taking: Reported on 02/27/2021), Disp: 75 mL, Rfl: 1 .  aspirin EC 81 MG tablet, Take 162 mg by mouth daily as  needed for moderate pain. Swallow whole. (Patient not taking: Reported on 02/27/2021), Disp: , Rfl:  .  AUVI-Q 0.3 MG/0.3ML SOAJ injection, Use as directed for life-threatening allergic reaction. (Patient not taking: Reported on 02/27/2021), Disp: 2 each, Rfl: 2 .  budesonide (PULMICORT) 0.5 MG/2ML nebulizer solution, Take 2 mLs (0.5 mg total) by nebulization as directed. (Patient not taking: Reported on 02/27/2021), Disp: 2 mL, Rfl: 3 .  budesonide-formoterol (SYMBICORT) 160-4.5 MCG/ACT inhaler, Inhale 2 puffs into the lungs 2 (two) times daily. (Patient not taking: Reported on 02/27/2021), Disp: 1 Inhaler, Rfl: 5 .  Continuous Blood Gluc Receiver (DEXCOM G6 RECEIVER) DEVI, 1 Device by Does not apply route as directed. (Patient not taking: Reported on 02/27/2021), Disp: 1 each, Rfl: 2 .  Glucagon (BAQSIMI TWO PACK) 3 MG/DOSE POWD, Place 1 spray into the nose as directed. (Patient not taking: Reported on 02/27/2021), Disp: 2 each, Rfl: 3 .  glucose blood test strip, Use to check blood sugar up to 10x daily (Patient not taking: Reported on 02/27/2021), Disp: 300 each, Rfl: 11 .  insulin lispro (INSULIN LISPRO) 100 UNIT/ML KwikPen Junior, Inject up to 50 units daily per provider instructions (Patient not taking: Reported on 02/27/2021), Disp: 15 mL, Rfl: 11 .  Insulin Pen Needle (PEN NEEDLES) 32G X 4 MM MISC, Use to inject insulin 6x per day (Patient not taking: Reported on 02/27/2021), Disp: 200 each, Rfl: 5 .  levocetirizine (XYZAL) 5 MG  tablet, Take 1 tablet (5 mg total) by mouth daily as needed for allergies. (Patient not taking: Reported on 02/27/2021), Disp: 30 tablet, Rfl: 5 .  Mometasone Furoate [facility] HFA) 200 MCG/ACT AERO, For asthma flare, begin Asmanex 200-2 puffs twice a day for 1-2 weeks or until cough and wheeze free (Patient not taking: Reported on 02/27/2021), Disp: 13 g, Rfl: 5 .  Multiple Vitamin (MULTIVITAMIN WITH MINERALS) TABS tablet, Take 1 tablet by mouth daily. (Patient not taking: Reported on 02/27/2021), Disp: , Rfl:  .  topiramate (TOPAMAX) 50 MG tablet, TAKE 1 TABLET(50 MG) BY MOUTH TWICE DAILY (Patient not taking: Reported on 02/27/2021), Disp: 60 tablet, Rfl: 5  Current Facility-Administered Medications:  [name]  Mepolizumab SOLR 100 mg, 100 mg, Subcutaneous, Q28 days, Valentina Shaggy, MD, 100 mg at 02/16/19 1018  Facility-Administered Medications Ordered in Other Visits:  .  gadopentetate dimeglumine (MAGNEVIST) injection 20 mL, 20 mL, Intravenous, Once PRN, Dohmeier, Asencion Partridge, MD  Allergies as of 02/27/2021 - Review Complete 02/27/2021  Allergen Reaction Noted  . Flonase [fluticasone] Shortness Of Breath 06/10/2017  . Sulfa antibiotics  12/10/2020  . Molds & smuts Other (See Comments) 03/09/2018  . Sulfites Itching and Swelling 02/04/2018  . Tetracyclines & related Other (See Comments) 03/09/2018  . Lactose intolerance (gi) Other (See Comments) 01/05/2021  . White birch Cough 04/10/2020    1. Work and Family: He can no longer work part-time at home. 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  2. Activities: He walks every day for short distances, but is in pain.  3. Smoking, alcohol, or drugs: He has not been drinking any alcohol or beer. No tobacco or drugs. 4. Primary Care Provider: His PCP is Ms Mattie Marlin, PA, [facility].  5. Neurology: Dr. Brett Fairy was his neurologist, but now he goes to [facility].  6. Ophthalmology:  7. Retinology: Dr. Zadie [name] 8.  Urology: Dr. Jeffie Pollock at [name] 9. Sports medicine: Dr. Oneida Alar and Dr. Barbaraann Barthel 10. Therapy: Ms [employer]a [employer], RN,  MSN  REVIEW OF SYSTEMS: There are no other significant problems involving Or[place]a's other body systems.   Objective:  Vital Signs:  BP 110/68 (BP Location: Right Arm, Patient Position: Sitting, Cuff Size: Normal)   Pulse 84   Wt 206 lb 6.4 oz (93.6 kg)   BMI 28.79 kg/m  BP 112/82   Ht Readings from Last 3 Encounters:  02/26/21 $RemoveB'5\' 11"'MVrsvlrh$  (1.803 m)  02/21/21 6' (1.829 m)  02/06/21 6' (1.829 m)   Wt Readings from Last 3 Encounters:  02/27/21 206 lb 6.4 oz (93.6 kg)  02/26/21 210 lb (95.3 kg)  02/21/21 209 lb (94.8 kg)    PHYSICAL EXAM:  Constitutional: The patient appears overweight, tired, weak, and sick. He has lost 5 pounds on our scales since his last visit. He looks better, in that he is more engaged, his verbal volume is better, and he does not appear as weak as he did. His affect is subdued and depressed. His insight is fair.   Eyes: There is no arcus or proptosis.  Mouth: The oropharynx appears normal. The tongue appears normal. There is normal oral moisture. There is no obvious gingivitis. There is no oral hyperpigmentation.  Neck: There are no bruits present. The thyroid gland appears enlarged. The gland is enlarged, but smaller, at about 21 grams in size. The consistency of the thyroid gland is normal. There is no thyroid tenderness to palpation. Lungs: The lungs are clear. Air movement is good. Heart: The heart rhythm and rate appear normal. Heart sounds S1 and S2 are normal. I do not appreciate any pathologic heart murmurs. Abdomen: The abdomen is enlarged. Bowel sounds are normal. The abdomen is soft. There is very mild tenderness to palpation around his umbilicus. There is no obviously palpable hepatomegaly, splenomegaly, or other masses.  Arms: Muscle mass appears appropriate for age.  Hands: There is no obvious tremor. Phalangeal and  metacarpophalangeal joints are tender to compression. Palms are normal. There is no palmar hyperpigmentation.  Legs: Muscle mass appears appropriate for age. There is no edema.  Neurologic: Muscle strength is low for age and gender  in both the upper and the lower extremities. Muscle tone appears normal. Sensation to touch is normal in the legs. Skin: Thre is no significant hyperpigmentation.  LAB DATA:   Labs 02/27/21: CBG 165  Labs 01/30/21: HbA1c 6.3%, CBG 163; Urine glucose negative, ketones negative  Labs 12/25/20: CBG 199   Labs 2/18-2/19/22: CBG 285; CBC normal; U/A >500 glucose, negative ketones; BMP normal, except CO2 20 and glucose 294; CK 77 (ref 49-397); hepatic panel normal  Labs 12/05/20: HbA1c 7.5%, CBG 165; BHOB  0.2 (ref 0.04-0.27); venous pH 7.284;   Labs 08/16/20: HbA1c 6.2%, CBG 215  Labs 05/14/20: HbA1c 6.5%, CBG 176; TSH 2.44, free T4 1.1, free T3 3.5; urinary microalbumin/creatinine ratio 2 (ref <30)  Labs 04/17/20: ESR 6 (ref 0-15), CK 82 (ref 49-439)  Labs 07/12/19: HbA1c 6.4%, CBG 231  Labs 04/26/19: HbA1c 6.8%, CBG 230  Labs 12/30/18: TSH 2.07, free T4 1.1, free T3 3.2, TPO antibody <1, thyroglobulin antibody <1; CMP normal, except glucose 191; cholesterol 165, triglycerides 138, HDL 33, LDL 106; urinary microalbumin/creatinine ratio 5; vitamin B1 10 (ref 8-30), vitamin B6 3.4 (ref 2.1-21.7), vitamin B12 451 (ref [id number])  Labs 11/29/18: CBG 179  Labs 11/29/18: CMP with glucose 198, chloride 109 (ref 96-106, CO2 17 (ref 20-29); CBC with WBC 11.6 (ref 3.4-10.8), PMNs 8.6 (ref 1.4-7.0), and monocytes 1.0 (ref 0.1-0.9)  Labs 10/28/18: HbA1c 6.9%,  CBG 155  Labs 10/06/18: ANA titer negative, ANCA titers negative, rheumatoid factor negative, cyclic citrullin peptide 7 (ref 0-190  Labs 07/27/18: CBG 193  Labs 05/31/18: HbA1c 6.6%, CBG 202  Labs 03/18/18: CBG 178  Labs 01/13/18: HbA1c 6.8%, CBG 194; TSH 0.98, free T4 1.3; free T3 3.6; CMP normal except glucose 191;  urinary microalbumin/creatinine ratio 35 (ref <30)  Labs 11/17/17: CBG 170  Labs 10/13/17: HbA1c 6.8%, CBG 191  Labs 08/24/17: CBG 117  Labs 07/20/17: CBG 216  Labs 07/08/17: IgA, IgG, and IgM normal. CBC normal.  Labs 06/16/17: HbA1c 6.5%, CBG 193  Labs 05/15/17: CBG 209; TSH 0.90, free T4 1.2, free T3 3.8  Labs 03/05/17: HbA1c 7.3%, CBG 241  Labs 02/20/17: CBG 180 fasting  Labs 01/29/17: CBG 148  Labs 01/12/17: CBG 116  Labs 12/29/16: HbA1c 9.1%, CBG 223; TSH 0.62, free T4 1.1, free T3 3.5; urine microalbumin/creatinine ratio 22; cholesterol 171, triglycerides 93, HDL 40, LDL 112;  CMP glucose 201  Labs 09/26/16: HbA1c 9.5%  Labs 08/13/16: Celiac panel negative; CMP normal except for glucose 207; CBC normal  Labs 06/13/16: HbA1c 9.8%; LH 3.6, FSH 2.3, testosterone 533 (ref 250-827), free testosterone 59.4 (ref 47-244)  Labs 02/11/16: HbA1c 9.2%  Labs 01/07/16: HbA1c 10.5%; CBC with Hgb elevated at 17.2%; LH 3.9, FSH 2.0; TSH 1.53, free T4 1.3, free T3 3.7; cholesterol 262, triglycerides 171, HDL 40, LDL 188; urinary microalbumin/creatinine ratio 31; CMP normal except for glucose 181  Labs 06/29/15. HbA1c 9.6%  Labs 12/28/14: Hemoglobin A1c 9.5% today, compared with 9.5%, at last visit and with 9.9% at the prior visit.  Labs 08/03/14: HbA1c 9.5%; TSH 0.548,free T4 1.10, free T3 3.7, TPO antibody < 1, TSI 30; CMP normal except glucose 191; urinary microalbumin/creatinine ratio 27; cholesterol 260, triglycerides 90, HDL 51, LDL 191   Labs 10/05/13: HbA1c 9.9%  Labs 04/23/13: CMP normal, except glucose 144; cholesterol 216, triglycerides 97, HDL 39, LDL 158; urinary microalbumin/creatinine ratio 6.9; TSH 0.505, free T4 1.17, free T3 3.8  Labs: 01/21/12: TSH 1.251, free T4 1.10, free T3 3.6, CBC normal, CMP normal except for glucose 206, urinary microalbumin/creatinine ratio 6.3, cholesterol 187, triglycerides 67, HDL 45, LDL 129   Assessment and Plan:   ASSESSMENT:  1. T1DM:   A. In  2021, his SGs had been mostly <200, reflecting fairly good DM control, but at the cost of severely curtailing his food intake. [name] had done very well at trying to optimize his BG control. This level of BG control without significant hypoglycemia in October 2021 was a testament to the power of using auto mode in insulin pump-CGM technology and to [name]'s determination to keep his BGs under control.  B. Unfortunately, thereafter he had many site problems and sensor problems. BGs and HbA1c were much higher in February 2022. His HbA1c increased to 7.5%%, compared to 6.2% in October 2021. He was also not having too many low BGs in October.  C. In early April 2022, however, the HbA1c had decreased to 6.3% and he was having more lower BGs, despite severely reducing the amount of insulin he took daily. As noted above, he was diagnosed with pancreatic exocrine deficiency.   D. Since starting and increasing Creon, he feels somewhat better. BGs are somewhat higher. He is very carefully and hesitantly increasing his food intake.  2. Hypoglycemia: He has had a few low BGs this month. He is slowly increasing his carb intake.   3. Hypertension: His BP is normal today.  4. Combined hyperlipidemia:   A. His lipids in March 2017 were too high, but the lipids in March 2018 were much better on atorvastatin. He discontinued the atorvastatin in mid-2018 due to concern that this medication might be causing his neuromuscular symptoms.   B. Ms Frederico Hamman re-started his atorvastatin several months ago. His lipids in March 2022 were worse. We will follow up on this issue after his pancreatic exocrine insufficiency stabilizes. 5. Autonomic neuropathy with tachycardia and gastroparesis: His gastroparesis has improved in parallel with his BG improvement. His heart rate has improved as well.  6. Obesity: Weight has decreased more. He is slowly trying to eat more.  7. Goiter:  A. His thyroid gland had shrunk back to normal size at a  previous visit, but had increased slightly at his last two visits and remains slightly enlarged today. His thyroiditis is clinically quiescent.   B. The active thyroiditis that he has had in the past and the  process of waxing and waning of thyroid gland size are c/w evolving Hashimoto's thyroiditis.   C. He was euthyroid in March 2013, but was borderline hyperthyroid in June 2014. He was euthyroid in October 2015, March 2017, March 2018, July 2018, in March 2019, in March 2020, in July 2021, and in April 2022.  9. Fatigue: This problem is much worse today..   10. Obstructive sleep apnea: He did not meet the criteria for OSA, but because of his snoring a dental appliance was suggested. I recommended that he obtain a dental appliance. Unfortunately, his former dentist no longer accepts [employer], so [name] has to find a new dentist. 45. Adult ADD: He had an appointment with Dr. Carlena Hurl, MD in [place] in November 2018. Dr. Wylene Simmer is waiting for [name]'s medical situation to stabilize before beginning any new psych meds.   12. Proliferative diabetic retinopathy: As above.  13. URI/nasal congestion/vertigo/allergic rhinitis and conjunctivitis: He is not having these problems today. The monthly injections have really helped him.   14. Neuromuscular symptoms and headaches/increased intracranial pressure:   A. It appeared previously that some of [name]'s headaches were classic tension headaches. When he performed cervical stretching exercises the headache resolved.   B. In the past when Dr. Brett Fairy evaluated [name] for his vague neuromuscular symptoms, she did not find any signs of any serious neurologic disease. His MRI of his brain was unremarkable. At that time his neuromuscular symptoms had resolved.  C. Later, however, the headaches worsened and some of the neuromuscular symptoms recurred. We now know that he had increased ICP and that his headaches improved for a brief time after the  LP. He then developed a typical spinal tap headache.  D. In retrospect he may have had intermittent problems with elevated ICP previously.  E. Dr. Brett Fairy suspects that [name]'s ICP was due to the doxycycline.  F. He appeared to have problems taking his prior dose of Diamox, but is doing better on the combination of a lower dose of Diamox and Topamax.   G. He is being evaluated at [facility] neurology now.  15. Calcified lymph node: It is possible that he may have contracted a fungus while he was working in the [employer]. several years ago. His thyroid US showed his goiter, but no intrinsic thyroid abnormality. A partially calcified cervical lymph node on the right was noted, c/w a post infections/inflammatory node. 16. Microalbuminuria: His ratio was mildly elevated in March 2019. He needed tighter BG and BP control. His ratio in  March 2020 and again in July 2021 was very good.  17. Anhedonia: [name] is more physically and emotionally depressed today. He desperately wants to be normal. His sessions with Ms Rea College, RN, MSN, a psychiatric nurse specialist, have been very beneficial. He still needs to find more sources of fun.  18. Neck pain/Cervical neuritis/neuromuscular pain: [name] has seen Dr. Oneida Alar several times. Dr. Oneida Alar had planned to obtain a neck xray if [name]'s neck did not improve. Unfortunately, [name] missed his lat appointment with Dr. Oneida Alar due to conflicting medical appointment.  I asked [name] to re-schedule his appointment with Dr. Oneida Alar.   19. Cerebral arteriosclerosis: It is unclear to me whether the lesions seen on angiography and MRA are amenable to vascular surgical techniques. 20> Vitamin d deficiency: His 25-OH vitamin D value in April 2022 was mid-normal.   PLAN:  1. Diagnostic: We reviewed his pump and Dexcom printouts today.  2. Therapeutic: He will see Dr. Barbaraann Barthel in Sports Medicine next Monday. [name] needs a plan to gradually , but progressively increase his physical  activities in terms of both cardio and strength gain. He may benefit from a PT consult and a nutrition/dietitian consult. Continue current pump settings    Basal rates: basal 1 MN:0.99 6 AM: 0.975 2 PM: 1.35 8 PM: 1.30   ISFs: 35   ICRs: MN: 7 8 AM: 4.5 11 AM: 4.5 9 PM: 7   BG targets:  MN: 130-150 6 AM: 120 10 PM 130-150  3. Patient education: We discussed all of the above at great length.  4. Follow up: 11:15 AM on 03/12/21   Level of Service: This visit lasted in excess of 70 minutes. More than 50% of the visit was devoted to coordinating care and documenting this encounter.    Tillman Sers, MD, CDE Adult and Pediatric Endocrinology

## 2021-02-26 NOTE — Patient Instructions (Addendum)
If you are age 39 or younger, your body mass index should be between 19-25. Your Body mass index is 29.29 kg/m. If this is out of the aformentioned range listed, please consider follow up with your Primary Care Provider.   We have sent the following medications to your pharmacy for you to pick up at your convenience:  INCREASE: Creon 36,000 units take 3 capsules with meals and 2 capsules with snacks.  TAKE: Omeprazole 20mg  each day.  You are scheduled to follow up in our office on 05-08-2021 at 10:50am.  Thank you for entrusting me with your care and choosing [facility].  Dr [facility]

## 2021-02-27 ENCOUNTER — Encounter (INDEPENDENT_AMBULATORY_CARE_PROVIDER_SITE_OTHER): Payer: Self-pay | Admitting: "Endocrinology

## 2021-02-27 ENCOUNTER — Other Ambulatory Visit: Payer: Self-pay

## 2021-02-27 ENCOUNTER — Ambulatory Visit (INDEPENDENT_AMBULATORY_CARE_PROVIDER_SITE_OTHER): Payer: 59 | Admitting: "Endocrinology

## 2021-02-27 VITALS — BP 110/68 | HR 84 | Wt 206.4 lb

## 2021-02-27 DIAGNOSIS — K8681 Exocrine pancreatic insufficiency: Secondary | ICD-10-CM

## 2021-02-27 DIAGNOSIS — I10 Essential (primary) hypertension: Secondary | ICD-10-CM | POA: Diagnosis not present

## 2021-02-27 DIAGNOSIS — M6281 Muscle weakness (generalized): Secondary | ICD-10-CM

## 2021-02-27 DIAGNOSIS — E10649 Type 1 diabetes mellitus with hypoglycemia without coma: Secondary | ICD-10-CM

## 2021-02-27 DIAGNOSIS — E1069 Type 1 diabetes mellitus with other specified complication: Secondary | ICD-10-CM

## 2021-02-27 LAB — POCT GLUCOSE (DEVICE FOR HOME USE): POC Glucose: 165 mg/dl — AB (ref 70–99)

## 2021-02-27 NOTE — Patient Instructions (Signed)
Follow up visit in 03/12/21 at 11;45 hours

## 2021-03-01 ENCOUNTER — Telehealth: Payer: Self-pay | Admitting: Gastroenterology

## 2021-03-01 DIAGNOSIS — K8689 Other specified diseases of pancreas: Secondary | ICD-10-CM

## 2021-03-01 NOTE — Telephone Encounter (Signed)
[name], He has met with a dietitian once or twice recently but is interested in being set up with a dietitian who may have more knowledge, expertise for patients with pancreatic insufficiency.  I am not sure if that type of dietitian even exists in this area, can you check on this and let me know?  Thank you very much

## 2021-03-01 NOTE — Telephone Encounter (Signed)
[facility] will see the pt for pancreatic insufficiency.  His copay at the time of the visit is $240 dollars per that office.  He will then be responsible for what insurance does not cover.  I have notified the pt and he wishes to proceed.  Referral made

## 2021-03-04 ENCOUNTER — Ambulatory Visit (INDEPENDENT_AMBULATORY_CARE_PROVIDER_SITE_OTHER): Payer: 59 | Admitting: Family Medicine

## 2021-03-04 ENCOUNTER — Encounter (INDEPENDENT_AMBULATORY_CARE_PROVIDER_SITE_OTHER): Payer: Self-pay | Admitting: Ophthalmology

## 2021-03-04 ENCOUNTER — Ambulatory Visit (INDEPENDENT_AMBULATORY_CARE_PROVIDER_SITE_OTHER): Payer: 59 | Admitting: Ophthalmology

## 2021-03-04 ENCOUNTER — Other Ambulatory Visit: Payer: Self-pay

## 2021-03-04 VITALS — BP 104/78 | Ht 71.75 in | Wt 206.0 lb

## 2021-03-04 DIAGNOSIS — M791 Myalgia, unspecified site: Secondary | ICD-10-CM

## 2021-03-04 DIAGNOSIS — E103552 Type 1 diabetes mellitus with stable proliferative diabetic retinopathy, left eye: Secondary | ICD-10-CM | POA: Diagnosis not present

## 2021-03-04 DIAGNOSIS — E103551 Type 1 diabetes mellitus with stable proliferative diabetic retinopathy, right eye: Secondary | ICD-10-CM

## 2021-03-04 DIAGNOSIS — E11649 Type 2 diabetes mellitus with hypoglycemia without coma: Secondary | ICD-10-CM | POA: Diagnosis not present

## 2021-03-04 DIAGNOSIS — E103512 Type 1 diabetes mellitus with proliferative diabetic retinopathy with macular edema, left eye: Secondary | ICD-10-CM | POA: Diagnosis not present

## 2021-03-04 DIAGNOSIS — H43822 Vitreomacular adhesion, left eye: Secondary | ICD-10-CM | POA: Diagnosis not present

## 2021-03-04 DIAGNOSIS — E103511 Type 1 diabetes mellitus with proliferative diabetic retinopathy with macular edema, right eye: Secondary | ICD-10-CM

## 2021-03-04 NOTE — Patient Instructions (Signed)
I will contact our dietitian (hopefully she's back in town) to ask if she recommends someone who can go more detailed with you on meals, a plan, what is missing. If you're going to walk for exercise I would not go more than 5 minutes at a time, not walk on back-to-back days, and increase by 1 minute each time you walk. We will look into aquatic therapy for you also - I think this is the best option to help you regain your strength without putting too much pressure on your joints having lost so much muscle mass. Follow up with me in 1 month but send me a mychart message or call if you have questions or concerns in the meantime - and I will contact you soon after I talk to our dietitian.

## 2021-03-04 NOTE — Progress Notes (Signed)
03/04/2021     CHIEF COMPLAINT Patient presents for Retina Evaluation (WIP - fluid OS - Ref'd by neuro //Pt c/o blue circle in [place] OS off and on x 3 weeks approx. Pt reports increased floaters OS x 3 weeks approx. Pt denies ocular pain OU. Pt sts he is able to see through blue circle OS. [place] OD stable, but OU is "a little blurry" overall. Pt c/o dryness OU. Pt is on Diamox 500mg  at noon, 250mg  at night PO.)   HISTORY OF PRESENT ILLNESS: [name] is a 40 y.o. male who presents to the clinic today for:   HPI    Retina Evaluation    Laterality: left eye   Onset: 3 weeks ago   Duration: 3 weeks   Associated Symptoms: Floaters   Context: distance vision, mid-range vision and near vision   Treatments tried: no treatments   Response to treatment: no improvement   Comments: WIP - fluid OS - Ref'd by neuro   Pt c/o blue circle in [place] OS off and on x 3 weeks approx. Pt reports increased floaters OS x 3 weeks approx. Pt denies ocular pain OU. Pt sts he is able to see through blue circle OS. [place] OD stable, but OU is "a little blurry" overall. Pt c/o dryness OU. Pt is on Diamox 500mg  at noon, 250mg  at night PO.       Last edited by [name], COA on 03/04/2021  3:08 PM. (History)      Referring physician: , PA-C [address] [place],  05/04/2021 [name]  HISTORICAL INFORMATION:   Selected notes from the MEDICAL RECORD NUMBER    Lab Results  Component Value Date   HGBA1C 6.3 (A) 01/30/2021     CURRENT MEDICATIONS: No current outpatient medications on file. (Ophthalmic Drugs)   No current facility-administered medications for this visit. (Ophthalmic Drugs)   Current Outpatient Medications (Other)  Medication Sig  . acetaZOLAMIDE (DIAMOX) 250 MG tablet Take 250 mg by mouth 2 (two) times daily.  [place] albuterol (PROAIR HFA) 108 (90 Base) MCG/ACT inhaler Inhale 2 puffs into the lungs every 6 (six) hours as needed for wheezing or shortness of  breath. (Patient not taking: Reported on 02/27/2021)  . albuterol (PROVENTIL) (2.5 MG/3ML) 0.083% nebulizer solution Take 3 mLs (2.5 mg total) by nebulization every 4 (four) hours as needed for wheezing or shortness of breath. (Patient not taking: Reported on 02/27/2021)  . ALPRAZolam (XANAX) 0.25 MG tablet Take 0.25 mg by mouth at bedtime.  04/01/2021 aspirin EC 81 MG tablet Take 162 mg by mouth daily as needed for moderate pain. Swallow whole. (Patient not taking: Reported on 02/27/2021)  . atorvastatin (LIPITOR) 20 MG tablet Take 20 mg by mouth daily.   04/29/2021 AUVI-Q 0.3 MG/0.3ML SOAJ injection Use as directed for life-threatening allergic reaction. (Patient not taking: Reported on 02/27/2021)  . budesonide (PULMICORT) 0.5 MG/2ML nebulizer solution Take 2 mLs (0.5 mg total) by nebulization as directed. (Patient not taking: Reported on 02/27/2021)  . budesonide-formoterol (SYMBICORT) 160-4.5 MCG/ACT inhaler Inhale 2 puffs into the lungs 2 (two) times daily. (Patient not taking: Reported on 02/27/2021)  . calcium carbonate (OSCAL) 1500 (600 Ca) MG TABS tablet Take 600 mg of elemental calcium by mouth once a week.  . Continuous Blood Gluc Receiver (DEXCOM G6 RECEIVER) DEVI 1 Device by Does not apply route as directed. (Patient not taking: Reported on 02/27/2021)  . Glucagon (BAQSIMI TWO PACK) 3 MG/DOSE POWD  Place 1 spray into the nose as directed. (Patient not taking: Reported on 02/27/2021)  . glucose blood test strip Use to check blood sugar up to 10x daily (Patient not taking: Reported on 02/27/2021)  . HUMALOG 100 UNIT/ML injection Use 300 units in insulin pump every 48 hours  . insulin lispro (INSULIN LISPRO) 100 UNIT/ML KwikPen Junior Inject up to 50 units daily per provider instructions (Patient not taking: Reported on 02/27/2021)  . Insulin Pen Needle (PEN NEEDLES) 32G X 4 MM MISC Use to inject insulin 6x per day (Patient not taking: Reported on 02/27/2021)  . levocetirizine (XYZAL) 5 MG tablet Take 1 tablet (5 mg total) by  mouth daily as needed for allergies. (Patient not taking: Reported on 02/27/2021)  . lipase/protease/amylase (CREON) 36000 UNITS CPEP capsule 3 pills with every meal and 2 pills with every snack  . losartan (COZAAR) 50 MG tablet TAKE 1 TABLET(50 MG) BY MOUTH DAILY  . Mometasone Furoate (ASMANEX HFA) 200 MCG/ACT AERO For asthma flare, begin Asmanex 200-2 puffs twice a day for 1-2 weeks or until cough and wheeze free (Patient not taking: Reported on 02/27/2021)  . montelukast (SINGULAIR) 10 MG tablet TAKE 1 TABLET BY MOUTH EVERY NIGHT AT BEDTIME  . Multiple Vitamin (MULTIVITAMIN WITH MINERALS) TABS tablet Take 1 tablet by mouth daily. (Patient not taking: Reported on 02/27/2021)  . NUCALA 100 MG/ML SOAJ INJECT  SUBCUTANEOUSLY EVERY 30 DAYS (GIVEN AT MD  OFFICE)  . omeprazole (PRILOSEC) 20 MG capsule Take 1 capsule (20 mg total) by mouth daily.  [name] topiramate (TOPAMAX) 50 MG tablet TAKE 1 TABLET(50 MG) BY MOUTH TWICE DAILY (Patient not taking: Reported on 02/27/2021)  . Vitamin D, Ergocalciferol, (DRISDOL) 1.25 MG (50000 UNIT) CAPS capsule Take 50,000 Units by mouth every 7 (seven) days.   Current Facility-Administered Medications (Other)  Medication Route  . Mepolizumab SOLR 100 mg Subcutaneous   Facility-Administered Medications Ordered in Other Visits (Other)  Medication Route  . gadopentetate dimeglumine (MAGNEVIST) injection 20 mL Intravenous      REVIEW OF SYSTEMS:    ALLERGIES Allergies  Allergen Reactions  . Flonase [Fluticasone] Shortness Of Breath  . Sulfa Antibiotics     Shortness of breath, increased heart rate; "happened quite a while ago"  . Molds & Smuts Other (See Comments)    Aggravate asthma  . Sulfites Itching and Swelling  . Tetracyclines & Related Other (See Comments)    increased intracranial pressure  . Lactose Intolerance (Gi) Other (See Comments)    Upset stomach  . [place] Cough    PAST MEDICAL HISTORY Past Medical History:  Diagnosis Date  . ADHD  (attention deficit hyperactivity disorder)   . Anxiety   . Asthma   . Autonomic neuropathy due to diabetes (HCC)   . Chronic headaches   . Diabetes (HCC)   . Fatigue   . GERD (gastroesophageal reflux disease)   . Goiter   . High blood pressure   . High cholesterol   . Hypercholesterolemia   . Hypertension   . Hypoglycemia associated with diabetes (HCC)   . Malnutrition (HCC)   . Obesity   . Sleep apnea    mild, no cpap  . Tachycardia   . Type 1 diabetes mellitus not at goal [facility])   . Uncontrolled DM with microalbuminuria or microproteinuria    Past Surgical History:  Procedure Laterality Date  . EYE SURGERY Right 05/30/2020   Vitrectomy, Dr. Luciana Axe  . EYE SURGERY  04/2020  . IR 3D INDEPENDENT  [facility]  07/16/2020  . IR ANGIO INTRA EXTRACRAN SEL COM CAROTID INNOMINATE BILAT MOD SED  07/16/2020  . IR ANGIO VERTEBRAL SEL SUBCLAVIAN INNOMINATE UNI L MOD SED  07/16/2020  . IR ANGIO VERTEBRAL SEL VERTEBRAL UNI R MOD SED  07/16/2020  . IR US GUIDE VASC ACCESS RIGHT  07/16/2020  . REFRACTIVE SURGERY     x9  . WISDOM TOOTH EXTRACTION      FAMILY HISTORY Family History  Problem Relation Age of Onset  . Dementia Paternal Aunt   . Schizophrenia Paternal Aunt   . Cancer Maternal Grandmother        type unknown  . Diabetes Maternal Grandmother        T2 DM  . Cancer Maternal Grandfather        type unknown  . Irritable bowel syndrome Mother   . Heart disease Father   . Hypertension Father   . Hyperlipidemia Father   . Skin cancer Father   . Thyroid disease Sister   . Stomach cancer Paternal Aunt   . Pancreatic cancer Paternal Aunt   . Diabetes Maternal Aunt   . Allergic rhinitis Neg Hx   . Angioedema Neg Hx   . Asthma Neg Hx   . Atopy Neg Hx   . Eczema Neg Hx   . Immunodeficiency Neg Hx   . Urticaria Neg Hx   . Esophageal cancer Neg Hx   . Colon cancer Neg Hx   . Colon polyps Neg Hx     SOCIAL HISTORY Social History   Tobacco Use  . Smoking status: Never Smoker  .  Smokeless tobacco: Never Used  Vaping Use  . Vaping Use: Never used  Substance Use Topics  . Alcohol use: No  . Drug use: No         OPHTHALMIC EXAM: Base Eye Exam    Visual Acuity (ETDRS)      Right Left   Dist cc 20/20 20/20   Correction: Glasses       Tonometry (Tonopen, 3:12 PM)      Right Left   Pressure 07 08       Pupils      Pupils Dark Light Shape React APD   Right PERRL 6.5 5.5 Round Brisk None   Left PERRL 6.5 5.5 Round Brisk None       Visual Fields (Counting fingers)      Left Right    Full Full       Extraocular Movement      Right Left    Full Full       Neuro/Psych    Oriented x3: Yes   Mood/Affect: Normal       Dilation    Left eye: 1.0% Mydriacyl, 2.5% Phenylephrine @ 3:12 PM        Slit Lamp and Fundus Exam    External Exam      Right Left   External Normal Normal       Slit Lamp Exam      Right Left   Lids/Lashes Normal Normal   Conjunctiva/Sclera White and quiet White and quiet   Cornea Clear Clear   Anterior Chamber Deep and quiet Deep and quiet   Iris Round and reactive Round and reactive   Lens Trace Nuclear sclerosis Trace Nuclear sclerosis   Anterior Vitreous Normal Normal       Fundus Exam      Right Left   Posterior Vitreous avitric Normal, no posterior vitreous detachment  Disc  Normal, No edema, no heme   C/D Ratio  0.0   Macula  Microaneurysms, no clinically significant macular edema   Vessels  PDR-inactive , good laser photocoagulation and diathermized sites of prior neovascularization superior to the nerve   Periphery  PRP, no retinal holes or tears, attached          IMAGING AND PROCEDURES  Imaging and Procedures for 03/04/21  OCT, Retina - OU - Both Eyes       Right Eye Quality was good. Scan locations included subfoveal. Central Foveal Thickness: 342. Progression has been stable. Findings include normal foveal contour.   Left Eye Quality was good. Scan locations included subfoveal. Central  Foveal Thickness: 332. Findings include vitreomacular adhesion , abnormal foveal contour, cystoid macular edema.   Notes Center involved CME, could be traction related as there is VMA, no perifoveal CME in the left eye suggestive that this is At most minor foveal change and not diffuse maculopathy                ASSESSMENT/PLAN:  Stable treated proliferative diabetic retinopathy of left eye with macular edema determined by examination associated with type 1 diabetes mellitus (HCC) OS minor center involved CME with no perifoveal thickening.  This could be VMA related however in the absence of vision loss or vision threatening change, will continue to observe this condition and follow-up dilate OU in 4 months or promptly should new difficulties arise  Stable treated proliferative diabetic retinopathy of right eye with macular edema determined by examination associated with type 1 diabetes mellitus (HCC) The nature of regressed proliferative diabetic retinopathy was discussed with the patient. The patient was advised to maintain good glucose, blood pressure, monitor kidney function and serum lipid control as advised by personal physician. Rare risk for reactivation of progression exist with untreated severe anemia, untreated renal failure, untreated heart failure, and smoking. Complete avoidance of smoking was recommended. The chance of recurrent proliferative diabetic retinopathy was discussed as well as the chance of vitreous hemorrhage for which further treatments may be necessary.   Explained to the patient that the quiescent  proliferative diabetic retinopathy disease is unlikely to ever worsen.  Worsening factors would include however severe anemia, hypertension out-of-control or impending renal failure.      ICD-10-CM   1. Vitreomacular adhesion of left eye  H43.822 OCT, Retina - OU - Both Eyes  2. Stable treated proliferative diabetic retinopathy of left eye with macular edema  determined by examination associated with type 1 diabetes mellitus (HCC)  Q00.8676    P95.0932   3. Stable treated proliferative diabetic retinopathy of right eye with macular edema determined by examination associated with type 1 diabetes mellitus (HCC)  E10.3551    E10.3511     1.  OS, center involved CSME.  Minor without perifoveal macular edema thus this could be tractional related.  No specific therapy is warranted as the  good acuity and no impact on acuity at this time  2.  Follow-up with neuro-ophthalmology as scheduled  3.  Ophthalmic Meds Ordered this visit:  No orders of the defined types were placed in this encounter.      Return in about 4 months (around 07/05/2021) for DILATE OU, OCT.  There are no Patient Instructions on file for this visit.   Explained the diagnoses, plan, and follow up with the patient and they expressed understanding.  Patient expressed understanding of the importance of proper follow up care.   Alford Highland  Lida Berkery M.D. Diseases & Surgery of the Retina and Vitreous [facility] 03/04/21     Abbreviations: M myopia (nearsighted); A astigmatism; H hyperopia (farsighted); P presbyopia; Mrx spectacle prescription;  CTL contact lenses; OD right eye; OS left eye; OU both eyes  XT exotropia; ET esotropia; PEK punctate epithelial keratitis; PEE punctate epithelial erosions; DES dry eye syndrome; MGD meibomian gland dysfunction; ATs artificial tears; PFAT's preservative free artificial tears; NSC nuclear sclerotic cataract; PSC posterior subcapsular cataract; ERM epi-retinal membrane; PVD posterior vitreous detachment; RD retinal detachment; DM diabetes mellitus; DR diabetic retinopathy; NPDR non-proliferative diabetic retinopathy; PDR proliferative diabetic retinopathy; CSME clinically significant macular edema; DME diabetic macular edema; dbh dot blot hemorrhages; CWS cotton wool spot; POAG primary open angle glaucoma; C/D cup-to-disc ratio; HVF  humphrey visual field; GVF goldmann visual field; OCT optical coherence tomography; IOP intraocular pressure; BRVO Branch retinal vein occlusion; CRVO central retinal vein occlusion; CRAO central retinal artery occlusion; BRAO branch retinal artery occlusion; RT retinal tear; SB scleral buckle; PPV pars plana vitrectomy; VH Vitreous hemorrhage; PRP panretinal laser photocoagulation; IVK intravitreal kenalog; VMT vitreomacular traction; MH Macular hole;  NVD neovascularization of the disc; NVE neovascularization elsewhere; AREDS age related eye disease study; ARMD age related macular degeneration; POAG primary open angle glaucoma; EBMD epithelial/anterior basement membrane dystrophy; ACIOL anterior chamber intraocular lens; IOL intraocular lens; PCIOL posterior chamber intraocular lens; Phaco/IOL phacoemulsification with intraocular lens placement; PRK photorefractive keratectomy; LASIK laser assisted in situ keratomileusis; HTN hypertension; DM diabetes mellitus; COPD chronic obstructive pulmonary disease

## 2021-03-04 NOTE — Assessment & Plan Note (Signed)

## 2021-03-04 NOTE — Assessment & Plan Note (Signed)
OS minor center involved CME with no perifoveal thickening.  This could be VMA related however in the absence of vision loss or vision threatening change, will continue to observe this condition and follow-up dilate OU in 4 months or promptly should new difficulties arise

## 2021-03-05 ENCOUNTER — Encounter: Payer: Self-pay | Admitting: Family Medicine

## 2021-03-05 NOTE — Progress Notes (Signed)
PCP: Teena IraniSpencer, [name], PA-C  Subjective:   HPI: Patient is a 40 y.o. male here for hand and foot pain.  3/9: Patient reports over the past 2 months he has felt increasingly weak. Primary complaint today is feeling like his feet and hands are 'down to the bone' and it feels uncomfortable when holding the steering wheel, walking. Feels at times like he cannot hold his body weight. Of note he's had difficulty with his insulin pump in getting sites to work (better past 5 days). Relatedly he's lost about 10+ pounds over the past 1-2 months. Reports large caloric deficit taking in 800-900 calories a day over past couple months. Denies numbness or tingling in feet or hands. Microfilament testing has been normal per endo notes. He is also scheduled to see neurologist tomorrow. Has regular appointments with diabetes educator but not dietitian. Denies nausea or vomiting though appetite is not what it has been. Autonomic dysfunction noted in chart - asked him about gastroparesis and he reports history of this being mentioned remotely.  3/23: Patient reports he feels like he's worse than last visit. Biggest problem is recent stomach virus with nausea, diarrhea, abdominal pain - feels this has improved but was present for several days. Blood sugars not increasing with his carbohydrate intake - even had to turn off his basal rate because blood sugar was dropping. This morning it appears to be rising in response to diet, had no ketones this morning and basal is now back on. Stomach feels crampy. Has not seen GI in past. Started BRAT diet as recommended by Dr. Fransico MichaelBrennan. Had initial visit to dietitian and doesn't follow up for about 3-4 more weeks - most discussion was general in the initial visit.  4/6: [name] comes in today very fatigued, run down. He reports he is not even running his insulin pump 90% of the time and maximum bolus he's given himself over more than a week was 1 unit of insulin. Blood  sugars still not responding to his dietary intake. He's consuming more than 2000 calories a day and has been tracking this regularly on an app. Eating variety of things but primarily eggs, sausage, bread, applesauce throughout the day. He is not sleeping because of concern about his blood sugars dropping overnight and not being surrounded by people who know how to take care of this if it were to happen. Takes more than an hour for blood sugar to stabilize when it starts to drop despite drinking sugar-containing beverages. He had EGD with Dr. Christella HartiganJacobs after evaluation - some gastritis and biopsies sent to assess for celiac but otherwise normal test. Gastric emptying study was normal in 2019. Has a pancreatic MRI scheduled for Saturday. Reports he's not sure how long he can continue like this though denied suicidal ideation when asked directly and no plan.  5/9: Patient was admitted to the hospital after last visit and had extensive workup. Found to have exocrine pancreatic insufficiency. Some concern his frequent monitoring is causing increased distress leading to decreased sleep and anxiety. They had a discussion regarding switch from pump to basal + bolus but patient would like to continue with pump - we separately discussed this today as well. Other labwork including for celiac, thyroid dysfunction, adrenal insufficiency normal. No insulinoma on imaging. He's interested in a more specific dietary plan than he's received to date - Dr. Christella HartiganJacobs looked into this for him and so far no luck. He continues to have site issues having to change daily. Tried walking  for exercise once - 30 minutes on treadmill and this caused quite a bit of joint pain subsequent days.  Past Medical History:  Diagnosis Date  . ADHD (attention deficit hyperactivity disorder)   . Anxiety   . Asthma   . Autonomic neuropathy due to diabetes (HCC)   . Chronic headaches   . Diabetes (HCC)   . Fatigue   . GERD  (gastroesophageal reflux disease)   . Goiter   . High blood pressure   . High cholesterol   . Hypercholesterolemia   . Hypertension   . Hypoglycemia associated with diabetes (HCC)   . Malnutrition (HCC)   . Obesity   . Sleep apnea    mild, no cpap  . Tachycardia   . Type 1 diabetes mellitus not at goal [facility])   . Uncontrolled DM with microalbuminuria or microproteinuria     Current Outpatient Medications on File Prior to Visit  Medication Sig Dispense Refill  . acetaZOLAMIDE (DIAMOX) 250 MG tablet Take 250 mg by mouth 2 (two) times daily.    [name] albuterol (PROAIR HFA) 108 (90 Base) MCG/ACT inhaler Inhale 2 puffs into the lungs every 6 (six) hours as needed for wheezing or shortness of breath. (Patient not taking: Reported on 02/27/2021) 1 Inhaler 2  . albuterol (PROVENTIL) (2.5 MG/3ML) 0.083% nebulizer solution Take 3 mLs (2.5 mg total) by nebulization every 4 (four) hours as needed for wheezing or shortness of breath. (Patient not taking: Reported on 02/27/2021) 75 mL 1  . ALPRAZolam (XANAX) 0.25 MG tablet Take 0.25 mg by mouth at bedtime.    [name] aspirin EC 81 MG tablet Take 162 mg by mouth daily as needed for moderate pain. Swallow whole. (Patient not taking: Reported on 02/27/2021)    . atorvastatin (LIPITOR) 20 MG tablet Take 20 mg by mouth daily.   0  . AUVI-Q 0.3 MG/0.3ML SOAJ injection Use as directed for life-threatening allergic reaction. (Patient not taking: Reported on 02/27/2021) 2 each 2  . budesonide (PULMICORT) 0.5 MG/2ML nebulizer solution Take 2 mLs (0.5 mg total) by nebulization as directed. (Patient not taking: Reported on 02/27/2021) 2 mL 3  . budesonide-formoterol (SYMBICORT) 160-4.5 MCG/ACT inhaler Inhale 2 puffs into the lungs 2 (two) times daily. (Patient not taking: Reported on 02/27/2021) 1 Inhaler 5  . calcium carbonate (OSCAL) 1500 (600 Ca) MG TABS tablet Take 600 mg of elemental calcium by mouth once a week.    . Continuous Blood Gluc Receiver (DEXCOM G6 RECEIVER) DEVI 1  Device by Does not apply route as directed. (Patient not taking: Reported on 02/27/2021) 1 each 2  . Continuous Blood Gluc Sensor (DEXCOM G6 SENSOR) MISC SMARTSIG:1 Each Topical Every 10 Days    . Glucagon (BAQSIMI TWO PACK) 3 MG/DOSE POWD Place 1 spray into the nose as directed. (Patient not taking: Reported on 02/27/2021) 2 each 3  . glucose blood test strip Use to check blood sugar up to 10x daily (Patient not taking: Reported on 02/27/2021) 300 each 11  . HUMALOG 100 UNIT/ML injection Use 300 units in insulin pump every 48 hours 120 mL 1  . insulin lispro (INSULIN LISPRO) 100 UNIT/ML KwikPen Junior Inject up to 50 units daily per provider instructions (Patient not taking: Reported on 02/27/2021) 15 mL 11  . Insulin Pen Needle (PEN NEEDLES) 32G X 4 MM MISC Use to inject insulin 6x per day (Patient not taking: Reported on 02/27/2021) 200 each 5  . levocetirizine (XYZAL) 5 MG tablet Take 1 tablet (5 mg total)  by mouth daily as needed for allergies. (Patient not taking: Reported on 02/27/2021) 30 tablet 5  . lipase/protease/amylase (CREON) 36000 UNITS CPEP capsule 3 pills with every meal and 2 pills with every snack 390 capsule 11  . losartan (COZAAR) 50 MG tablet TAKE 1 TABLET(50 MG) BY MOUTH DAILY 90 tablet 1  . Mometasone Furoate [facility] HFA) 200 MCG/ACT AERO For asthma flare, begin Asmanex 200-2 puffs twice a day for 1-2 weeks or until cough and wheeze free (Patient not taking: Reported on 02/27/2021) 13 g 5  . montelukast (SINGULAIR) 10 MG tablet TAKE 1 TABLET BY MOUTH EVERY NIGHT AT BEDTIME 30 tablet 5  . Multiple Vitamin (MULTIVITAMIN WITH MINERALS) TABS tablet Take 1 tablet by mouth daily. (Patient not taking: Reported on 02/27/2021)    . NUCALA 100 MG/ML SOAJ INJECT 100MG  SUBCUTANEOUSLY EVERY 30 DAYS (GIVEN AT MD  OFFICE) 1 mL 11  . omeprazole (PRILOSEC) 20 MG capsule Take 1 capsule (20 mg total) by mouth daily. 30 capsule 11  . topiramate (TOPAMAX) 50 MG tablet TAKE 1 TABLET(50 MG) BY MOUTH TWICE DAILY  (Patient not taking: Reported on 02/27/2021) 60 tablet 5  . Vitamin D, Ergocalciferol, (DRISDOL) 1.25 MG (50000 UNIT) CAPS capsule Take 50,000 Units by mouth every 7 (seven) days.     Current Facility-Administered Medications on File Prior to Visit  Medication Dose Route Frequency Provider Last Rate Last Admin  . gadopentetate dimeglumine (MAGNEVIST) injection 20 mL  20 mL Intravenous Once PRN Dohmeier, 04/29/2021, MD      . Mepolizumab SOLR 100 mg  100 mg Subcutaneous Q28 days Porfirio Mylar, MD   100 mg at 02/16/19 1018    Past Surgical History:  Procedure Laterality Date  . EYE SURGERY Right 05/30/2020   Vitrectomy, Dr. 07/30/2020  . EYE SURGERY  04/2020  . IR 3D INDEPENDENT WKST  07/16/2020  . IR ANGIO INTRA EXTRACRAN SEL COM CAROTID INNOMINATE BILAT MOD SED  07/16/2020  . IR ANGIO VERTEBRAL SEL SUBCLAVIAN INNOMINATE UNI L MOD SED  07/16/2020  . IR ANGIO VERTEBRAL SEL VERTEBRAL UNI R MOD SED  07/16/2020  . IR 07/18/2020 GUIDE VASC ACCESS RIGHT  07/16/2020  . REFRACTIVE SURGERY     x9  . WISDOM TOOTH EXTRACTION      Allergies  Allergen Reactions  . Flonase [Fluticasone] Shortness Of Breath  . Sulfa Antibiotics     Shortness of breath, increased heart rate; "happened quite a while ago"  . Molds & Smuts Other (See Comments)    Aggravate asthma  . Sulfites Itching and Swelling  . Tetracyclines & Related Other (See Comments)    increased intracranial pressure  . Lactose Intolerance (Gi) Other (See Comments)    Upset stomach  . [place] Cough    Social History   Socioeconomic History  . Marital status: Single    Spouse name: Not on file  . Number of children: 0  . Years of education: Not on file  . Highest education level: Not on file  Occupational History  . Occupation: 07/18/2020  Tobacco Use  . Smoking status: Never Smoker  . Smokeless tobacco: Never Used  Vaping Use  . Vaping Use: Never used  Substance and Sexual Activity  . Alcohol use: No  . Drug use: No  .  Sexual activity: Not on file  Other Topics Concern  . Not on file  Social History Narrative   ** Merged History Encounter **       Social Determinants of  Health   Financial Resource Strain: Not on file  Food Insecurity: Not on file  Transportation Needs: Not on file  Physical Activity: Not on file  Stress: Not on file  Social Connections: Not on file  Intimate Partner Violence: Not on file    Family History  Problem Relation Age of Onset  . Dementia Paternal Aunt   . Schizophrenia Paternal Aunt   . Cancer Maternal Grandmother        type unknown  . Diabetes Maternal Grandmother        T2 DM  . Cancer Maternal Grandfather        type unknown  . Irritable bowel syndrome Mother   . Heart disease Father   . Hypertension Father   . Hyperlipidemia Father   . Skin cancer Father   . Thyroid disease Sister   . Stomach cancer Paternal Aunt   . Pancreatic cancer Paternal Aunt   . Diabetes Maternal Aunt   . Allergic rhinitis Neg Hx   . Angioedema Neg Hx   . Asthma Neg Hx   . Atopy Neg Hx   . Eczema Neg Hx   . Immunodeficiency Neg Hx   . Urticaria Neg Hx   . Esophageal cancer Neg Hx   . Colon cancer Neg Hx   . Colon polyps Neg Hx     BP 104/78   Ht 5' 11.75" (1.822 m)   Wt 206 lb (93.4 kg)   BMI 28.13 kg/m   [facility] Exercise 09/11/2020  Frequency of aerobic exercise (# of days/week) 5  Average time in minutes 30  Frequency of strengthening activities (# of days/week) 0    No flowsheet data found.  Review of Systems: See HPI above.     Objective:  Physical Exam:  Gen: Affect improved compared to last visit, appears fatigued.   Assessment & Plan:  1. Hypoglycemia, Type 1 DM - continued weight loss.  Recent hospitalization where blood sugars were reassuring as well as his workup.  No insulinoma, concurrent adrenal insufficiency, celiac disease, thyroid disease.  Continue working with endocrinology - we discussed switch from pump to basal  bolus.  I will contact our dietitian to see if there's someone who could meet with him to go over more specific meal plans.  We discussed starting physical therapy with focus on aquatic component to unload joints - referral placed.  If he does walk for exercise recommended starting with 5 minutes at most.  Plan to f/u in 1 month.

## 2021-03-11 ENCOUNTER — Other Ambulatory Visit: Payer: Self-pay

## 2021-03-11 ENCOUNTER — Ambulatory Visit (INDEPENDENT_AMBULATORY_CARE_PROVIDER_SITE_OTHER): Payer: 59 | Admitting: *Deleted

## 2021-03-11 DIAGNOSIS — Z23 Encounter for immunization: Secondary | ICD-10-CM

## 2021-03-11 NOTE — Progress Notes (Signed)
Subjective:  Patient Name: [name] Date of Birth: 1981-01-12  MRN: [id number]  [name]  presents at his clinic visit today for follow-up of his type 1 diabetes mellitus, goiter, obesity, combined hyperlipidemia, adult ADHD, fatigue, autonomic neuropathy, tachycardia, hypertension, microalbuminuria, hypoglycemia, sinusitis, bronchitis, allergies, proliferative retinopathy, pseudotumor cerebri/increased intracranial hypertension, and unintentional weight loss.[name]   HISTORY OF PRESENT ILLNESS:   [name] is a 40 y.o. Caucasian gentleman. [name] was unaccompanied.  1. The patient was first referred to me on 01/08/2006 by his family physician, Dr. Andreas Blower, for evaluation and management of type 1 diabetes mellitus and related problems. The patient was 40 years old.  A. [name] had been diagnosed with type 1 diabetes somewhere in 2001-2002, at the age of 37-19. He was initially diagnosed with type 2 diabetes mellitus, but was later re-classified as type 1 diabetes mellitus. He had been followed by Dr. Romilda Garret, staff endocrinologist at [facility]. [name] was started on an insulin pump approximately 18 months prior to first seeing me. His pump was a Medtronic Paradigm 712. His blood glucose control was fair-to-poor. He was frequently not checking blood sugars or taking insulin boluses as often as he needed to. He frequently noted fast heart rate. The patient's past medical history was positive for hypertension, for which he was taking lisinopril, and for what his mother called "extreme ADHD". He had previously been taking ADHD medicines but had discontinued them due to adverse effects. He had prior ankle injuries and knee injuries, to include tears of the lateral menisci bilaterally. He had not had any surgeries. 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 As a result, the patient was on the road a lot and spent  much of his time outdoors at field sites. He did not use tobacco or drugs, but did drink alcohol occasionally.  B. On physical examination, his weight was 234 pounds, his height was 70 inches, his BMI was 33.2. Blood pressure was 128/70. Hemoglobin A1c was 9.5%. Heart rate was 96. He had normal affect and fair insight. He had a 25-30 gram goiter. He had normal 1+ DP pulses in his feet. He had normal sensation in his feet to touch, vibration, and monofilament. His CMP was normal except for glucose of 251. His cholesterol was 257, triglycerides 143, HDL 49, and LDL 179. TSH was 1.017, free T4 was 1.06, and free T3 was 3.3. His urinary microalbumin: creatinine ratio was 20.1 (normal less than 30).   C. The patient clearly needed better blood glucose control, which would only occur if he had better adherence to his plan. He appeared to have autonomic neuropathy, manifested by inappropriate sinus tachycardia.  His thyroid goiter suggested that he might have evolving Hashimoto's disease. He stated that he had lost some weight recently. I encouraged him to check his blood sugars more frequently and to take both correction boluses and food boluses.  2. During the past fifteen years, [name] has had to deal with many different problems:  A. T1DM/hypoglycemia:    1). 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 His hemoglobin A1c values had varied from 8.3-11.2%.   2). After starting him on a Medtronic 670G insulin pump and Guardian 3 sensor on 01/12/17, his BG control has improved significantly, despite the illnesses  described below.   B. DM complications: .   1). He has bilateral diabetic retinopathy and is followed by his retinologist, Dr. Deloria Lair, MD.     A). He saw Dr. Zadie Rhine in October 2020 and again in February 2021. There were no new signs of diabetic  eye disease.     2). At [facility] visit with me in July 2021 he asked me to arrange a second opinion retinal consultation for him. I arranged for him to see Dr. Dennard Nip in the [place].    2). He has autonomic neuropathy manifested by inappropriate sinus tachycardia and gastroparesis causing postprandial bloating and GERD. These problems have varied inversely with his degree of BG control.   C. Combined hyperlipidemia:     1). On 12/03/10 his total cholesterol was 270, triglycerides 94, HDL 50, and LDL 201. I started him on Crestor, 10 mg per day. He has since begun treatment with atorvastatin and his cholesterol values had improved.    2). On 12/29/16 his lipids had improved, but his cholesterol values were still elevated. After developing the illnesses noted below, his atorvastatin was discontinued due to concerns that he might be having adverse reactions to the atorvastatin.    D. Allergies, sinusitis, bronchitis, headaches, muscle pains/spasms:   1). Unfortunately, beginning in about June 2018, [name] began to develop recurrent episodes of allergic rhinitis, head fullness, sinusitis, and bronchitis. He also developed headaches, muscle pains and spasms, lethargy, fatigue, and weakness that were presumably due in large part to severe allergies. His atorvastatin and several other medications were stopped at that time. Despite receiving excellent care from his allergist, to include allergy injections and biologic injections, [name]'s allergic issues persisted.    2). He saw Dr. Carmelina Peal on 03/15/19 and again in February 2021. [name]'s allergies and asthma have been okay since has been wearing his covid mask. He has not needed steroids recently. His second sleep study was not bad enough to start on C-pap. In the past 8-10 months these illnesses/problems have largely resolved, in part due to taking a monthly biologic allergy injection.   E. Increased intracranial pressure:    1). [name] was seen in consultation by Dr.  Larey Seat, neurology, on 05/28/17 for headaches, tremors of his right hand and foot, shaky vision, and symptoms c/w OSA. His initial sleep study did not show any significant amount of OSA.    2). On 03/08/18, Dr.Dohmeier was notified that [place] eye doctor had seen signs of fluid build up in his right eye. An LP was performed in radiology on 03/09/18. Opening pressure was 28 cm of water, c/w increased intracranial pressure and pseudotumor cerebri. This problem was initially though to be due to tetracycline usage, so TCN was discontinued and Diamox begun. The Diamox doses have been changed over time.    3). On 01/03/19 he had an LP that showed elevated pressure. His diamox dose was increased somewhat. Dr. Brett Fairy also started him on Topamax. He had a follow up visit with Dr. Brett Fairy on 02/23/20. She continued him on low doses of both medications. He still has some adverse effects of the Diamox, but not as bad. He still has some mild fatigue.  G. Adult ADD/depression: [name] was evaluated by Dr Carlena Hurl, a noted neuropsychiatrist in [place] in 2018. At his last follow up visit in November 2018, Dr. Wylene Simmer decided to wait until [name]'s overall health situation  stabilized before trying any new medications.   H. General physical and emotional health: [name]  has been very sick for much of the past 3 years. He has been unable to work full-time and has become almost confined to his home. He has become increasingly anhedonic over time.   I. Neck pains, decreased cervical range of motion, and shoulder girdle pains:   1). Several months ago he began to develop pains in his left upper chest and shoulder area. He saw Dr. Oneida Alar. The pains then went into his right upper chest and shoulder, then into his shoulder blades. Now he can't bend his neck. [place] of his left shoulder was negative.    2). He missed his follow up appointment with Dr. Oneida Alar due to other conflicting medical appointments. I asked him to  re-schedule that appointment.  J. He had an MRI and an MRA of his brain. The MRI of the brain and brainstem down to C3 was normal. The MRA showed the following:  Moderate stenosis of the M1 segment of the left middle cerebral artery.  Negligible flow through the A1 segment of the left anterior cerebral artery.  This could be due to a hypoplastic/aplastic segment, a normal variant.  This could also be due to severe stenosis.  The left A2 segment derived this flow from the right to through the anterior communicating artery a "moderate stenosis of the M1 segment of the left middle cerebral artery: He then had an angiography procedure to evaluate his bilateral carotid  And innominate arterial circulation. That study showed: Approximately 65-70% stenosis of the left internal carotid artery supraclinoid segment secondary to a mildly irregular intracranial arteriosclerotic plaque. No opacification of the left anterior cerebral A1 segment most likely represents a pathologic intracranial occlusion. 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 Approximately 10 mm x 8.3 mm pyramidal configured high-riding jugular bulb. [name] was told to" take 325 mg of ASA daily as a secondary stroke prevention strategy".  K. Following the angiography procedure he developed bilateral palmar hand numbness. Fortunately, these symptoms are improving   L. He has an asthma consultation with Ms Juliann Pares, [place], on 07/24/20. He had mild restrictive disease, essentially unchanged.  M. He had an evaluation by Dr. Celine Ahr, [facility], on 06/19/20 and a follow up visit on 10.01/21. The cardiologist recommended continuing his  ASA and statin therapy, but did not feel any cardiology follow up was needed.   Doristine Counter is followed by [facility] for Idiopathic Intracranial Hypertension (IIH). He  saw his neurologist at [facility] on 10/15/20. MRA of the brain did not add any additional info. [name] had a follow up visit on 01/03/21.  O. On 01/30/21, I saw him as an urgent add-on because Dr. Karlton Lemon saw [name] in the [facility] that day and thought that [name] looked bad and needed to be admitted. When I saw [name] I agreed. I contacted the Internal Medicine Teaching Service and had [place] admitted for evaluation and management.    1). During that admission from 01/30/21-02/02/21, [name] was thoroughly evaluated. TSH was 1.23, free T4 1.1, free T3 3.6; 25-OH vitamin D was 51.    2). To rule out autoimmune adrenal insufficiency (Addison's disease), two sets of ACTH and cortisol values were performed. On 01/30/21 at 1:56 PM, his ACTH was 23 (ref 6-50 in the AM) and his cortisol was 16.5 (ref 4-22 at 8 AM, 3-17 at 4 PM). On 01/31/21 at  7:24 AM his ACTH was 29.9 (ref 7.2-63.3 in the AM) and his cortisol was 6.5 (ref 6.7-22.6 in the AM). Although the ACTH values were well within normal limits, which would tend to exclude adrenal cortical insufficiency, he had one normal cortisol and one low cortisol. To further evaluate his hypothalamic-pituitary-adrenal (HPA) axis, an ACTH stimulation test was performed on 02/01/21 at 8:21 AM. Baseline cortisol was normal at 8.9. +30 minute cortisol was normal at 21.6. +60 minute cortisol was normal at 24.6. This very brisk and normal response ruled out adrenal insufficiency.    3). To rule out celiac disease, a tissue transglutaminase IgA and IgA were drawn. The tTG IgA was normal at <1.0. The IgA was normal at 136 (ref 47-310). Dr. Ardis Hughs also performed an endoscopy on 01/28/21 and took biopsies of the small stomach and small intestine. The gastric biopsies were negative for H. Pylori and the small intestinal biopsies were negative for celiac disease.                   4). To rule out an extrapancreatic insulinoma, a C-peptide was obtained on 01/30/21. The value was very low at <0.1  (ref 1.1-4.4), c/w his autoimmune T1DM.              5). To rule out a pancreatic tumor, an abdominal MRI was performed with and without contrast on 01/31/21. The pancreas was "moderately thin/atrophic", similar to a CT scan performed on 03/10/18. "There was no abnormal pancreatic parenchymal enhancement.   6). To rule out exocrine pancreas insufficiency, three tests were performed on 01/31/21.  The serum lipase was normal at 22 (ref 11-51). The serum amylase was low at 23 (ref 28-100). The stool pancreatic elastase was markedly low at 76 (ref >200). The low serum amylase and the very low stool elastase confirmed the clinical suspicion of pancreatic exocrine deficiency. Unfortunately, we do not know the cause of this exocrine insufficiency.    7). [name] was started on Creon and discharged on 02/02/21. His initial Creon dose was one 36,000 unit capsule by mouth with meals.   P. On 02/12/21 he had a CT/MRA scan of his head and neck. The brain was normal. He had mild soft plaque at the ICA bulb. No stenosis was seen. He had a 50% stenosis of the left supraclinoid ICA. There was no other intracranial large of medium vessel occlusion of correctable proximal stenosis. No patent A1 segment was seen on the left. Both anterior cerebral arteries receive their blood supply from the right carotid circulation.   Q. He met with Dr. Ardis Hughs in GI on 02/26/2021. Dr. Ardis Hughs increased the Creon doses to 3 with meals and 2 with snacks. Dr. Ardis Hughs referred [name] to [facility] for assistance in dietary management of his pancreatic insufficiency, .   3. [name]'s last [facility] visit was on 02/27/2021. At that visit [name] had still been feeling "pretty rough, but maybe a little better". Today he is feeling "a little bit better".   A. In the interim,    1). His BGs have been doing "pretty well", but he continues to have site problems. He is changing sites about once a day.  The sites work well for about a day, then  deteriorate. The sites bleed almost every time he withdraws sites. He tried different sites in his buttocks that lasted for up to 3 days.     2). He is able to eat more now without discomfort. He is now re-gaining  weight. He is not having much abdominal bloating. He is not having any diarrhea. He does have occasional abdominal cramps, but fewer. He no longer feels like he is still losing strength, but also does not feel stronger. His joints still hurt.  B.  On 03/04/21 [name] had a follow up visit with Dr. Barbaraann Barthel in Sports Medicine for evaluation and management of muscle pain. Dr. Barbaraann Barthel recommended aquatic therapy and a gradually and progressively increasing walking plan to help [name] regain his strength. [name] has not started that plan yet. He is waiting to see PT.   C. On 03/04/21 [name] also had a visit with Dr. Zadie Rhine in Ophthalmology. [name] had some microaneurysms, but no clinically significant macular edema. His PDR was inactive.  DChester [name] had a second Moderna covid booster immunization on 03/11/21.  E. [name] was previously found to have vitamin D level of 10. He is now taking 50,000 IU/week. He also takes a MVI daily.   F. He continues to see his therapist, Ms. [employer], every week. He is using alprazolam, 0.25 mg at bedtime when needed for anxiety. His PCP will prescribe the medication for him.  He thinks the medication and the therapy are helping him.   G. His nausea only occurs occasionally. He is taking omeprazole once daily.   H. He continues to have problems with lactose intolerance. When he avoids lactose, however, he is asymptomatic. Some foods such as shellfish cause his throat to swell up.   I. He takes losartan, 50 mg/day. He rarely has any coughing unless his allergies act up. He also takes atorvastatin, 20 mg/day.    J. His insurance plan will not allow him to have any more than 200 BG test strips per month.   4. Pertinent Review of Systems: Constitutional: [name] feels "a bit tired and  really sore all over". His sleep quality is "not too great due to pain". He has both insomnia and early awakening.  Eyes: As above. He still has the "sparkles" in his eyes essentially all the time. The visual color changes, muscle spasms, and tingling that were attributed to Diamox still bother him, but less since reducing the Diamox dose. Neck: He has not had any recent anterior neck swelling and tenderness.  Lungs: Breathing has been "good", but he still sometimes has SOB with exertion.  Heart: He thinks his heart beats about the same. Gastrointestinal: As above. Bowel movements have been better. Hands: All of his joints hurt.   Legs: As above.  Muscle mass and strength seem about the same, not worse and not better. There are no other complaints of numbness, tingling, burning, or pain. No edema is noted. Feet: There are more pains in his soles. There are no complaints of numbness, or burning.  No edema is noted. Hypoglycemia: He has had some low BG alerts.  Emotional: "I'm worn out, but I'm dealing with it."    5. Pump printout: He changes sites every 1-3 days, usually every day.    6. CGM printout: He has been in auto mode 99%, compared with 98% at his last visit. He wears his sensor 96%. Average SG is 161, compared with 159 at his last visit and with 154 at his last prior. Time in Range is 76%, compared with 73% at the last visit and with 80% at his prior visit. Time above range is 24%, compared with 24% at his last visit. SGs average about 180 at midnight. SGs decrease to about 80-150 about 10 AM  to 2 PM, then increased to about 240 when he has a bigger lunch. SGS are about 140-160 in the evening. Lunch still tends to be his largest meal.  PAST MEDICAL, FAMILY, AND SOCIAL HISTORY:  Past Medical History:  Diagnosis Date  . ADHD (attention deficit hyperactivity disorder)   . Anxiety   . Asthma   . Autonomic neuropathy due to diabetes ([place])   . Chronic headaches   . Diabetes ([place])   .  Fatigue   . GERD (gastroesophageal reflux disease)   . Goiter   . High blood pressure   . High cholesterol   . Hypercholesterolemia   . Hypertension   . Hypoglycemia associated with diabetes ([place])   . Malnutrition ([place])   . Obesity   . Sleep apnea    mild, no cpap  . Tachycardia   . Type 1 diabetes mellitus not at goal [facility])   . Uncontrolled DM with microalbuminuria or microproteinuria     Family History  Problem Relation Age of Onset  . Dementia Paternal Aunt   . Schizophrenia Paternal Aunt   . Cancer Maternal Grandmother        type unknown  . Diabetes Maternal Grandmother        T2 DM  . Cancer Maternal Grandfather        type unknown  . Irritable bowel syndrome Mother   . Heart disease Father   . Hypertension Father   . Hyperlipidemia Father   . Skin cancer Father   . Thyroid disease Sister   . Stomach cancer Paternal Aunt   . Pancreatic cancer Paternal Aunt   . Diabetes Maternal Aunt   . Allergic rhinitis Neg Hx   . Angioedema Neg Hx   . Asthma Neg Hx   . Atopy Neg Hx   . Eczema Neg Hx   . Immunodeficiency Neg Hx   . Urticaria Neg Hx   . Esophageal cancer Neg Hx   . Colon cancer Neg Hx   . Colon polyps Neg Hx      Current Outpatient Medications:  .  acetaZOLAMIDE (DIAMOX) 250 MG tablet, Take 250 mg by mouth 2 (two) times daily., Disp: , Rfl:  .  albuterol (PROAIR HFA) 108 (90 Base) MCG/ACT inhaler, Inhale 2 puffs into the lungs every 6 (six) hours as needed for wheezing or shortness of breath. (Patient not taking: No sig reported), Disp: 1 Inhaler, Rfl: 2 .  albuterol (PROVENTIL) (2.5 MG/3ML) 0.083% nebulizer solution, Take 3 mLs (2.5 mg total) by nebulization every 4 (four) hours as needed for wheezing or shortness of breath. (Patient not taking: No sig reported), Disp: 75 mL, Rfl: 1 .  ALPRAZolam (XANAX) 0.25 MG tablet, Take 0.25 mg by mouth at bedtime., Disp: , Rfl:  .  aspirin EC 81 MG tablet, Take 162 mg by mouth daily as needed for moderate pain.  Swallow whole. (Patient not taking: No sig reported), Disp: , Rfl:  .  atorvastatin (LIPITOR) 20 MG tablet, Take 20 mg by mouth daily. , Disp: , Rfl: 0 .  AUVI-Q 0.3 MG/0.3ML SOAJ injection, Use as directed for life-threatening allergic reaction. (Patient not taking: No sig reported), Disp: 2 each, Rfl: 2 .  budesonide (PULMICORT) 0.5 MG/2ML nebulizer solution, Take 2 mLs (0.5 mg total) by nebulization as directed. (Patient not taking: No sig reported), Disp: 2 mL, Rfl: 3 .  budesonide-formoterol (SYMBICORT) 160-4.5 MCG/ACT inhaler, Inhale 2 puffs into the lungs 2 (two) times daily. (Patient not taking: No sig reported), Disp:  1 Inhaler, Rfl: 5 .  calcium carbonate (OSCAL) 1500 (600 Ca) MG TABS tablet, Take 600 mg of elemental calcium by mouth once a week., Disp: , Rfl:  .  Continuous Blood Gluc Receiver (DEXCOM G6 RECEIVER) DEVI, 1 Device by Does not apply route as directed., Disp: 1 each, Rfl: 2 .  Continuous Blood Gluc Sensor (DEXCOM G6 SENSOR) MISC, SMARTSIG:1 Each Topical Every 10 Days, Disp: , Rfl:  .  Continuous Blood Gluc Sensor (FREESTYLE LIBRE 2 SENSOR) MISC, Inject 1 Device into the skin every 14 (fourteen) days. Place prescription on hold. To use if Dexcom CGM fails., Disp: 2 each, Rfl: 11 .  Glucagon (BAQSIMI TWO PACK) 3 MG/DOSE POWD, Place 1 spray into the nose as directed. (Patient not taking: No sig reported), Disp: 2 each, Rfl: 3 .  glucose blood test strip, Use to check blood sugar up to 10x daily, Disp: 300 each, Rfl: 11 .  HUMALOG 100 UNIT/ML injection, Use 300 units in insulin pump every 48 hours, Disp: 120 mL, Rfl: 1 .  insulin lispro (INSULIN LISPRO) 100 UNIT/ML KwikPen Junior, Inject up to 50 units daily per provider instructions (Patient not taking: No sig reported), Disp: 15 mL, Rfl: 11 .  Insulin Pen Needle (PEN NEEDLES) 32G X 4 MM MISC, Use to inject insulin 6x per day (Patient not taking: No sig reported), Disp: 200 each, Rfl: 5 .  levocetirizine (XYZAL) 5 MG tablet, Take 1  tablet (5 mg total) by mouth daily as needed for allergies., Disp: 30 tablet, Rfl: 5 .  lipase/protease/amylase (CREON) 36000 UNITS CPEP capsule, 3 pills with every meal and 2 pills with every snack, Disp: 390 capsule, Rfl: 11 .  losartan (COZAAR) 50 MG tablet, TAKE 1 TABLET(50 MG) BY MOUTH DAILY, Disp: 90 tablet, Rfl: 1 .  Mometasone Furoate (ASMANEX HFA) 200 MCG/ACT AERO, For asthma flare, begin Asmanex 200-2 puffs twice a day for 1-2 weeks or until cough and wheeze free (Patient not taking: No sig reported), Disp: 13 g, Rfl: 5 .  montelukast (SINGULAIR) 10 MG tablet, TAKE 1 TABLET BY MOUTH EVERY NIGHT AT BEDTIME, Disp: 30 tablet, Rfl: 5 .  Multiple Vitamin (MULTIVITAMIN WITH MINERALS) TABS tablet, Take 1 tablet by mouth daily., Disp: , Rfl:  .  NUCALA 100 MG/ML SOAJ, INJECT $RemoveBefo'100MG'[id number]$  SUBCUTANEOUSLY EVERY 30 DAYS (GIVEN AT MD  OFFICE), Disp: 1 mL, Rfl: 11 .  omeprazole (PRILOSEC) 20 MG capsule, Take 1 capsule (20 mg total) by mouth daily., Disp: 30 capsule, Rfl: 11 .  topiramate (TOPAMAX) 50 MG tablet, TAKE 1 TABLET(50 MG) BY MOUTH TWICE DAILY, Disp: 60 tablet, Rfl: 5 .  Vitamin D, Ergocalciferol, (DRISDOL) 1.25 MG (50000 UNIT) CAPS capsule, Take 50,000 Units by mouth every 7 (seven) days., Disp: , Rfl:   Current Facility-Administered Medications:  [name]  Mepolizumab SOLR 100 mg, 100 mg, Subcutaneous, Q28 days, Valentina Shaggy, MD, 100 mg at 02/16/19 1018  Facility-Administered Medications Ordered in Other Visits:  .  gadopentetate dimeglumine (MAGNEVIST) injection 20 mL, 20 mL, Intravenous, Once PRN, Dohmeier, Asencion Partridge, MD  Allergies as of 03/12/2021 - Review Complete 03/12/2021  Allergen Reaction Noted  . Flonase [fluticasone] Shortness Of Breath 06/10/2017  . Sulfa antibiotics  12/10/2020  . Molds & smuts Other (See Comments) 03/09/2018  . Sulfites Itching and Swelling 02/04/2018  . Tetracyclines & related Other (See Comments) 03/09/2018  . Lactose intolerance (gi) Other (See Comments)  01/05/2021  . White birch Cough 04/10/2020    1. Work and Family: He can  no longer work part-time at home. 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  2. Activities: He walks every day for short distances, but is in pain.  3. Smoking, alcohol, or drugs: He has not been drinking any alcohol or beer. No tobacco or drugs. 4. Primary Care Provider: His PCP is Ms Mattie Marlin, PA, [facility].  5. Neurology: Dr. Brett Fairy was his neurologist, but now he goes to [facility].  6. Ophthalmology:  7. Retinology: Dr. Zadie Rhine 8. Urology: Dr. Jeffie Pollock at [facility] 9. Sports medicine: Dr. Oneida Alar and Dr. Barbaraann Barthel 10. Therapy: Ms Rea College, RN, MSN  REVIEW OF SYSTEMS: There are no other significant problems involving Jamiere's other body systems.   Objective:  Vital Signs:  BP 110/70   Pulse 96   Wt 214 lb 15.2 oz (97.5 kg)   BMI 29.36 kg/m  BP 112/82   Ht Readings from Last 3 Encounters:  03/04/21 5' 11.75" (1.822 m)  02/26/21 $RemoveB'5\' 11"'kgrSEmCQ$  (1.803 m)  02/21/21 6' (1.829 m)   Wt Readings from Last 3 Encounters:  03/12/21 214 lb 15.2 oz (97.5 kg)  03/12/21 208 lb 6.4 oz (94.5 kg)  03/04/21 206 lb (93.4 kg)    PHYSICAL EXAM:  Constitutional: The patient appears overweight, tired, weak, but better. His affect is better. The volume and intensity of his speech have increased.  He has gained 8 pounds on our scales since his last visit. He looks better, in that he is more engaged and does not appear as weak as he did. His insight is fair. He continues to improve. When we discussed how much better he is now than he was before his hospitalization, he began to joke and laugh.   Eyes: There is no arcus or proptosis.  Mouth: The oropharynx appears normal. The tongue appears normal. There is normal oral moisture. There is no obvious gingivitis. There is no oral hyperpigmentation.  Neck: There are no bruits present. The thyroid gland appears enlarged. The  gland is enlarged, again about 21 grams in size. The consistency of the thyroid gland is normal. There is no thyroid tenderness to palpation. Lungs: The lungs are clear. Air movement is good. Heart: The heart rhythm and rate appear normal. Heart sounds S1 and S2 are normal. I do not appreciate any pathologic heart murmurs. Abdomen: The abdomen is enlarged. Bowel sounds are normal. The abdomen is soft. There is very mild tenderness to palpation around his umbilicus. There is no obviously palpable hepatomegaly, splenomegaly, or other masses.  Arms: Muscle mass appears appropriate for age.  Hands: There is no obvious tremor. Phalangeal and metacarpophalangeal joints are tender to compression. Palms are normal. There is no palmar hyperpigmentation.  Legs: Muscle mass appears appropriate for age. There is no edema.  Feet: 1-2+ right DP pulse, 2+ left DP pulse Neurologic: Muscle strength is low for age and gender in both the upper and the lower extremities. Muscle tone appears normal. Sensation to touch is normal in the legs and feet. Skin: Thre is no significant hyperpigmentation.  LAB DATA:   Labs 03/12/21: CBG 169  Labs 02/27/21: CBG 165  Labs 01/30/21: HbA1c 6.3%, CBG 163; Urine glucose negative, ketones negative  Labs 12/25/20: CBG 199   Labs 2/18-2/19/22: CBG 285; CBC normal; U/A >500 glucose, negative ketones; BMP normal, except CO2 20 and glucose 294; CK 77 (ref 49-397); hepatic panel normal  Labs 12/05/20: HbA1c 7.5%, CBG 165; BHOB  0.2 (ref 0.04-0.27); venous pH  7.284;   Labs 08/16/20: HbA1c 6.2%, CBG 215  Labs 05/14/20: HbA1c 6.5%, CBG 176; TSH 2.44, free T4 1.1, free T3 3.5; urinary microalbumin/creatinine ratio 2 (ref <30)  Labs 04/17/20: ESR 6 (ref 0-15), CK 82 (ref 49-439)  Labs 07/12/19: HbA1c 6.4%, CBG 231  Labs 04/26/19: HbA1c 6.8%, CBG 230  Labs 12/30/18: TSH 2.07, free T4 1.1, free T3 3.2, TPO antibody <1, thyroglobulin antibody <1; CMP normal, except glucose 191; cholesterol  165, triglycerides 138, HDL 33, LDL 106; urinary microalbumin/creatinine ratio 5; vitamin B1 10 (ref 8-30), vitamin B6 3.4 (ref 2.1-21.7), vitamin B12 451 (ref [phone])  Labs 11/29/18: CBG 179  Labs 11/29/18: CMP with glucose 198, chloride 109 (ref 96-106, CO2 17 (ref 20-29); CBC with WBC 11.6 (ref 3.4-10.8), PMNs 8.6 (ref 1.4-7.0), and monocytes 1.0 (ref 0.1-0.9)  Labs 10/28/18: HbA1c 6.9%, CBG 155  Labs 10/06/18: ANA titer negative, ANCA titers negative, rheumatoid factor negative, cyclic citrullin peptide 7 (ref 0-190  Labs 07/27/18: CBG 193  Labs 05/31/18: HbA1c 6.6%, CBG 202  Labs 03/18/18: CBG 178  Labs 01/13/18: HbA1c 6.8%, CBG 194; TSH 0.98, free T4 1.3; free T3 3.6; CMP normal except glucose 191; urinary microalbumin/creatinine ratio 35 (ref <30)  Labs 11/17/17: CBG 170  Labs 10/13/17: HbA1c 6.8%, CBG 191  Labs 08/24/17: CBG 117  Labs 07/20/17: CBG 216  Labs 07/08/17: IgA, IgG, and IgM normal. CBC normal.  Labs 06/16/17: HbA1c 6.5%, CBG 193  Labs 05/15/17: CBG 209; TSH 0.90, free T4 1.2, free T3 3.8  Labs 03/05/17: HbA1c 7.3%, CBG 241  Labs 02/20/17: CBG 180 fasting  Labs 01/29/17: CBG 148  Labs 01/12/17: CBG 116  Labs 12/29/16: HbA1c 9.1%, CBG 223; TSH 0.62, free T4 1.1, free T3 3.5; urine microalbumin/creatinine ratio 22; cholesterol 171, triglycerides 93, HDL 40, LDL 112;  CMP glucose 201  Labs 09/26/16: HbA1c 9.5%  Labs 08/13/16: Celiac panel negative; CMP normal except for glucose 207; CBC normal  Labs 06/13/16: HbA1c 9.8%; LH 3.6, FSH 2.3, testosterone 533 (ref 250-827), free testosterone 59.4 (ref 47-244)  Labs 02/11/16: HbA1c 9.2%  Labs 01/07/16: HbA1c 10.5%; CBC with Hgb elevated at 17.2%; LH 3.9, FSH 2.0; TSH 1.53, free T4 1.3, free T3 3.7; cholesterol 262, triglycerides 171, HDL 40, LDL 188; urinary microalbumin/creatinine ratio 31; CMP normal except for glucose 181  Labs 06/29/15. HbA1c 9.6%  Labs 12/28/14: Hemoglobin A1c 9.5% today, compared with 9.5%, at last  visit and with 9.9% at the prior visit.  Labs 08/03/14: HbA1c 9.5%; TSH 0.548,free T4 1.10, free T3 3.7, TPO antibody < 1, TSI 30; CMP normal except glucose 191; urinary microalbumin/creatinine ratio 27; cholesterol 260, triglycerides 90, HDL 51, LDL 191   Labs 10/05/13: HbA1c 9.9%  Labs 04/23/13: CMP normal, except glucose 144; cholesterol 216, triglycerides 97, HDL 39, LDL 158; urinary microalbumin/creatinine ratio 6.9; TSH 0.505, free T4 1.17, free T3 3.8  Labs: 01/21/12: TSH 1.251, free T4 1.10, free T3 3.6, CBC normal, CMP normal except for glucose 206, urinary microalbumin/creatinine ratio 6.3, cholesterol 187, triglycerides 67, HDL 45, LDL 129   Assessment and Plan:   ASSESSMENT:  1. T1DM:   A. In 2021, his SGs had been mostly <200, reflecting fairly good DM control, but at the cost of severely curtailing his food intake. [name] had done very well at trying to optimize his BG control. This level of BG control without significant hypoglycemia in October 2021 was a testament to the power of using auto mode in insulin pump-CGM technology and to [employer]  determination to keep his BGs under control.  B. Unfortunately, thereafter he had many site problems and sensor problems. BGs and HbA1c were much higher in February 2022. His HbA1c increased to 7.5%%, compared to 6.2% in October 2021. He was also not having too many low BGs in October.  C. In early April 2022, however, the HbA1c had decreased to 6.3% and he was having more lower BGs, despite severely reducing the amount of insulin he took daily. As noted above, he was diagnosed with pancreatic exocrine deficiency in April 2022.   D. Since starting and increasing Creon, he feels better. BGs are somewhat higher. He is very carefully increasing his food intake.  2. Hypoglycemia: He has had a few low BGs this month. He is slowly increasing his carb intake.   3. Hypertension: His BP is normal today.  4. Combined hyperlipidemia:   A. His lipids in March  2017 were too high, but the lipids in March 2018 were much better on atorvastatin. He discontinued the atorvastatin in mid-2018 due to concern that this medication might be causing his neuromuscular symptoms.   B. Ms Frederico Hamman re-started his atorvastatin several months ago. His lipids in March 2022 were worse. We will follow up on this issue after his pancreatic exocrine insufficiency stabilizes. 5. Autonomic neuropathy with tachycardia and gastroparesis: His gastroparesis has improved in parallel with his BG improvement. His heart rate has improved as well.  6. Obesity: Weight has increased. He is trying to eat more.  7. Goiter:  A. His thyroid gland had shrunk back to normal size at a previous visit, but had increased slightly at two previous visits and remains slightly enlarged today. His thyroiditis is clinically quiescent.   B. The active thyroiditis that he has had in the past and the  process of waxing and waning of thyroid gland size are c/w evolving Hashimoto's thyroiditis.   C. He was euthyroid in March 2013, but was borderline hyperthyroid in June 2014. He was euthyroid in October 2015, March 2017, March 2018, July 2018, in March 2019, in March 2020, in July 2021, and in April 2022.  9. Fatigue: This problem is improving slowly, but progressively.  10. Obstructive sleep apnea: He did not meet the criteria for OSA, but because of his snoring a dental appliance was suggested. I recommended that he obtain a dental appliance. Unfortunately, his former dentist no longer accepts [employer], so [name] has to find a new dentist. 70. Adult ADD: He had an appointment with Dr. Carlena Hurl, MD in [place] in November 2018. Dr. Wylene Simmer is waiting for [name]'s medical situation to stabilize before beginning any new psych meds.   12. Proliferative diabetic retinopathy: As above.  13. URI/nasal congestion/vertigo/allergic rhinitis and conjunctivitis: He is having more of these problems today.  The monthly injections had really helped him two years ago. He is coordinating with his allergist to re-start the injections. .   14. Neuromuscular symptoms and headaches/increased intracranial pressure:   A. It appeared previously that some of [name]'s headaches were classic tension headaches. When he performed cervical stretching exercises the headache resolved.   B. In the past when Dr. Brett Fairy evaluated [name] for his vague neuromuscular symptoms, she did not find any signs of any serious neurologic disease. His MRI of his brain was unremarkable. At that time his neuromuscular symptoms had resolved.  C. Later, however, the headaches worsened and some of the neuromuscular symptoms recurred. We now know that he had increased ICP and that  his headaches improved for a brief time after the LP. He then developed a typical spinal tap headache.  D. In retrospect he may have had intermittent problems with elevated ICP previously.  E. Dr. Brett Fairy suspects that [name]'s ICP was due to the doxycycline.  F. He appeared to have problems taking his prior dose of Diamox, but is doing better on the combination of a lower dose of Diamox and Topamax.   G. He is being evaluated at [facility] neurology now.  15. Calcified lymph node: It is possible that he may have contracted a fungus while he was working in the [employer]. several years ago. His thyroid US showed his goiter, but no intrinsic thyroid abnormality. A partially calcified cervical lymph node on the right was noted, c/w a post infections/inflammatory node. 16. Microalbuminuria: His ratio was mildly elevated in March 2019. He needed tighter BG and BP control. His ratio in March 2020 and again in July 2021 was very good.  17. Anhedonia: [name] is much less physically and emotionally depressed today. He desperately wants to be normal. His sessions with Ms Rea College, RN, MSN, a psychiatric nurse specialist, have been very beneficial. He still needs to find more  sources of fun.  18. Neck pain/Cervical neuritis/neuromuscular pain: [name] has seen Dr. Barbaraann Barthel is following him now. .   19. Cerebral arteriosclerosis: It is unclear to me whether the lesions seen on angiography and MRA are amenable to vascular surgical techniques. 20. Vitamin D deficiency: His 25-OH vitamin D value in April 2022 was mid-normal.   PLAN:  1. Diagnostic: We reviewed his pump and Dexcom printouts today.  2. Therapeutic: He will see Dr. Barbaraann Barthel in Sports Medicine in follow up. He will also see PT and nutrition.  [name] needs a plan to gradually , but progressively increase his physical activities in terms of both cardio and strength gain. Continue current pump settings    Basal rates: basal 1 MN:0.99 6 AM: 0.975 2 PM: 1.35 8 PM: 1.30   ISFs: 35   ICRs: MN: 7 8 AM: 4.5 11 AM: 4.5 9 PM: 7   BG targets:  MN: 130-150 6 AM: 120 10 PM 130-150  3. Patient education: We discussed all of the above at great length.  4. Follow up: 3:45 AM on 04/10/21   Level of Service: This visit lasted in excess of 60 minutes. More than 50% of the visit was devoted to coordinating care and documenting this encounter.    Tillman Sers, MD, CDE Adult and Pediatric Endocrinology

## 2021-03-11 NOTE — Progress Notes (Addendum)
S:     Chief Complaint  Patient presents with   Diabetes    Education    Endocrinology provider: Dr. Tobe Sos (upcoming appt 03/12/21 11:15 am)  Patient referred to me by Dr. Tobe Sos for insulin pump initiation and training. 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 Patient wears a t:slim X2 insulin pump and Dexcom G6 CGM. Patient was started on t:slim X2 insulin pump on 02/07/21. Patient has been followed for pump adjustments / pump education since initiation of t:slim X2 insulin pump.  Patient recently was seen by Dr. Tobe Sos on 02/27/21. His avg BG was 185 (BG range 145-216) and TIR was ~73%. Patient admitted a few hypoglycemic episodes. Most noticeable pattern was increase in BG readings as patient has been increasing carbohydrate intake. Dr. Tobe Sos continued all insulin pump settings.   Patient presents today for follow up appt. Patient states that his Creon dose was recently increased (now taking 3 capsules with meals, 2 capsules with snacks). He states he is having site issues now. He is able to use site for only 1 day. When he takes out his site he states infusion set site area bleeds and there are lumps. He contacted [employer] and they sent him Autosoft 90/XC infusion set sites and they sent longer tubing for Trusteel infusion sets. He also has been having issues with Dexcom sensors. He is interested in having a FSL 2.0 CGM prescription to use as back up in case he continues to have Dexcom issues. He also is having issues with connection with tslim and dexcom sensor; particualrly when tslim battery is <90%. He states this happens 1-2x daily for 10-15 min. He states Dexcom G6 CGM sensor/transmitter always stays  connected to his phone.  Insurance: Therapist, art (Max: 3.0) 12AM 1.1                           Total: 26.4 units   Insulin to carbohydrate ratio (ICR)  12AM 10  11AM  8                    Max Bolus: 25   Insulin Sensitivity Factor (ISF) 12AM 50                             Target BG 12AM 110                             Pump Serial Number: [id number]   Infusion Set: Trusteel 6 mm   Infusion Set Sites -Patient reports infusion sites are thighs, lower back  --Patient reports independently doing infusion set site changes --Patient reports rotating infusion set sites Getting TruSteel and Autosoft sites as of right now  Diet  (no changes since prior appt on 02/20/21) Patient reported dietary habits:  -Breakfast (10-11am; took 2 capsule of Creon): 2 eggs, sausage link, [place], 1 slice of bread (24-58 g of carb), mustard -Lunch (~2pm): sandwich (~30 grams of carb)  -Snack (sometimes, 1 capsule of Creon): nuts, [place], broccoli, fig bar -Dinner (~7:30 pm; 2 capsules of Creon): eggs, chicken, broccoli, baby carrots, 1 slice of bread (09-98 g of carb) -Snack (~12-1am, always snacks at  this dinner; 1 capsule of Creon): quest protein chips, nuts, [place], fig bar -Fast food: none  -Sweets/deserts: none -Pasta/rice: none -Fruit: 1 or 2 slices of apple, blueberries -Cereal: none  -Chips/crackers: quest protein chips -Milk: none (lactose intolerant) -Soda/juice: none  Exercise (changes since prior appt on 02/20/21) Patient-reported exercise habits: walks ~20 min daily (walking less, met with new PT ([name]) - will be starting aquatic therapy))   Monitoring: Patient reports 1 episode of nocturia (nighttime urination) each night Patient reports occasional neuropathy (nerve pain). Patient reports visual changes (blurry vision since eye surgery). (Followed by ophthalmology; last seen by Dr. Leodis Liverpool at [facility] on 02/19/21) -Per patient, Dr. Leodis Liverpool,  stated there was "some fluid" found and retinopathy may have worsened. Dr. Leodis Liverpool advised him to follow up with retinal specialist -Has followed up with retinal specialist. Per patient there is a pocket of fluid near the retina and the retinal specialist will be monitoring this.  Patient reports self foot exams; no open cuts/wounds   O:   Labs:   Dexcom G6 CGM Report      Tconnect Report   Patient has changed below target to less than 110   Vitals:   03/12/21 0910  BP: 110/70  Pulse: 96    Lab Results  Component Value Date   HGBA1C 6.3 (A) 01/30/2021   HGBA1C 7.5 (A) 12/05/2020   HGBA1C 6.2 (A) 08/16/2020    Lab Results  Component Value Date   CPEPTIDE <0.1 (L) 01/30/2021       Component Value Date/Time   CHOL 197 01/24/2021 0853   TRIG 111 01/24/2021 0853   HDL 40 01/24/2021 0853   CHOLHDL 4.9 01/24/2021 0853   VLDL 19 12/29/2016 1146   LDLCALC 135 (H) 01/24/2021 0853    Lab Results  Component Value Date   MICRALBCREAT 2 05/14/2020    Assessment: DM Management - TIR is at goal > 70%. No hypoglycemia. Patient is extremely well controlled. He is on 76% basal and ~24% bolus. Patient is extremely anxious regarding bad pump sites. Per review of his report it does not appear he is having bad pump sites. It is possible that there may be slight blockage within tubing considering complaints of bruising/bleeding, however, TIR is 76%. Thoroughly discussed bad pump site management and pump site education. It is possible patient needs stronger ICR/ISF however patient still entering low amount of carbohydrates (will enter 3-5 when snacking, 14-25 with meals maybe 1-2x daily). Advised him to continue enter carbs as accurately as possible. If he notices post prandial hyperglycemia > 2 days in a row that he has to enter ghost carbs to lower blood sugar after a meal (> 2x) then advised pt he can change his insulin to carbohydrate ratio (ICR) from 10 --> 8 between 12am - 11am  and/or 8 --> 7 between 11 am - 12 am. Will continue all pump settings for now Follow up in 1 month.  Education 1. Pump Site issues - Advised to try INNER stomach (avoid OUTER stomach due to insulin mediated lipohypertrophy) and to try lower back for pump sites. Provided multiple samples of different infusion sets to try out as well (Autosoft XC, Autosoft 30, Autosoft 90). Also advised patient, Dexcom and [employer] pump need to be near each other for best connection. If issues persist with connection then advised patient to contact [employer] technical support.  2. Site prescriptions - I contacted Christopher Potocnik ([employer] rep) about changing patient's infusion set site prescriptions. Advised patient to contact  me if it has been >1 week and patient has not heard from [employer].   3. Freestyle Libre 2.0 CGM sensors - Freestyle Libre 2.0 CGM sensors have been sent to your preferred pharmacy to use as backup.   4. Creon - Remember with Creon you MUST eat at least 100 grams fat/day.   Plan: Continue all insulin pump settings  Education - Discussed issues with pump site issues, site prescriptions, freestyle libre 2.0 CGM sensors, Creon with patient extensively.  Monitoring:  Continue wearing Dexcom G6 CGM [name] has a diagnosis of diabetes, checks blood glucose readings > 4x per day, wears an insulin pump, and requires frequent adjustments to insulin regimen. This patient will be seen every six months, minimally, to assess adherence to their CGM regimen and diabetes treatment plan. Follow Up: 1 month  This appointment required 60 minutes of patient care (this includes precharting, chart review, review of results, face-to-face care, etc.).  Thank you for involving clinical pharmacist/diabetes educator to assist in providing this patient's care.  Drexel Iha, PharmD, CPP, CDCES  Tillman Sers, MD. CDE

## 2021-03-11 NOTE — Progress Notes (Incomplete)
Subjective:  Patient Name: [name] Date of Birth: 1981-01-12  MRN: [id number]  [name]  presents at his clinic visit today for follow-up of his type 1 diabetes mellitus, goiter, obesity, combined hyperlipidemia, adult ADHD, fatigue, autonomic neuropathy, tachycardia, hypertension, microalbuminuria, hypoglycemia, sinusitis, bronchitis, allergies, proliferative retinopathy, pseudotumor cerebri/increased intracranial hypertension, and unintentional weight loss.[name]   HISTORY OF PRESENT ILLNESS:   [name] is a 40 y.o. Caucasian gentleman. [name] was unaccompanied.  1. The patient was first referred to me on 01/08/2006 by his family physician, Dr. Andreas Blower, for evaluation and management of type 1 diabetes mellitus and related problems. The patient was 40 years old.  A. [name] had been diagnosed with type 1 diabetes somewhere in 2001-2002, at the age of 37-19. He was initially diagnosed with type 2 diabetes mellitus, but was later re-classified as type 1 diabetes mellitus. He had been followed by Dr. Romilda Garret, staff endocrinologist at [facility]. [name] was started on an insulin pump approximately 18 months prior to first seeing me. His pump was a Medtronic Paradigm 712. His blood glucose control was fair-to-poor. He was frequently not checking blood sugars or taking insulin boluses as often as he needed to. He frequently noted fast heart rate. The patient's past medical history was positive for hypertension, for which he was taking lisinopril, and for what his mother called "extreme ADHD". He had previously been taking ADHD medicines but had discontinued them due to adverse effects. He had prior ankle injuries and knee injuries, to include tears of the lateral menisci bilaterally. He had not had any surgeries. 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 As a result, the patient was on the road a lot and spent  much of his time outdoors at field sites. He did not use tobacco or drugs, but did drink alcohol occasionally.  B. On physical examination, his weight was 234 pounds, his height was 70 inches, his BMI was 33.2. Blood pressure was 128/70. Hemoglobin A1c was 9.5%. Heart rate was 96. He had normal affect and fair insight. He had a 25-30 gram goiter. He had normal 1+ DP pulses in his feet. He had normal sensation in his feet to touch, vibration, and monofilament. His CMP was normal except for glucose of 251. His cholesterol was 257, triglycerides 143, HDL 49, and LDL 179. TSH was 1.017, free T4 was 1.06, and free T3 was 3.3. His urinary microalbumin: creatinine ratio was 20.1 (normal less than 30).   C. The patient clearly needed better blood glucose control, which would only occur if he had better adherence to his plan. He appeared to have autonomic neuropathy, manifested by inappropriate sinus tachycardia.  His thyroid goiter suggested that he might have evolving Hashimoto's disease. He stated that he had lost some weight recently. I encouraged him to check his blood sugars more frequently and to take both correction boluses and food boluses.  2. During the past fifteen years, [name] has had to deal with many different problems:  A. T1DM/hypoglycemia:    1). 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 His hemoglobin A1c values had varied from 8.3-11.2%.   2). After starting him on a Medtronic 670G insulin pump and Guardian 3 sensor on 01/12/17, his BG control has improved significantly, despite the illnesses  described below.   B. DM complications: .   1). He has bilateral diabetic retinopathy and is followed by his retinologist, Dr. Deloria Lair, MD.     A). He saw Dr. Zadie Rhine in October 2020 and again in February 2021. There were no new signs of diabetic  eye disease.     2). At [facility] visit with me in July 2021 he asked me to arrange a second opinion retinal consultation for him. I arranged for him to see Dr. Dennard Nip in the [place].    2). He has autonomic neuropathy manifested by inappropriate sinus tachycardia and gastroparesis causing postprandial bloating and GERD. These problems have varied inversely with his degree of BG control.   C. Combined hyperlipidemia:     1). On 12/03/10 his total cholesterol was 270, triglycerides 94, HDL 50, and LDL 201. I started him on Crestor, 10 mg per day. He has since begun treatment with atorvastatin and his cholesterol values had improved.    2). On 12/29/16 his lipids had improved, but his cholesterol values were still elevated. After developing the illnesses noted below, his atorvastatin was discontinued due to concerns that he might be having adverse reactions to the atorvastatin.    D. Allergies, sinusitis, bronchitis, headaches, muscle pains/spasms:   1). Unfortunately, beginning in about June 2018, [name] began to develop recurrent episodes of allergic rhinitis, head fullness, sinusitis, and bronchitis. He also developed headaches, muscle pains and spasms, lethargy, fatigue, and weakness that were presumably due in large part to severe allergies. His atorvastatin and several other medications were stopped at that time. Despite receiving excellent care from his allergist, to include allergy injections and biologic injections, [name]'s allergic issues persisted.    2). He saw Dr. Carmelina Peal on 03/15/19 and again in February 2021. [name]'s allergies and asthma have been okay since has been wearing his covid mask. He has not needed steroids recently. His second sleep study was not bad enough to start on C-pap. In the past 8-10 months these illnesses/problems have largely resolved, in part due to taking a monthly biologic allergy injection.   E. Increased intracranial pressure:    1). [name] was seen in consultation by Dr.  Larey Seat, neurology, on 05/28/17 for headaches, tremors of his right hand and foot, shaky vision, and symptoms c/w OSA. His initial sleep study did not show any significant amount of OSA.    2). On 03/08/18, Dr.Dohmeier was notified that [place] eye doctor had seen signs of fluid build up in his right eye. An LP was performed in radiology on 03/09/18. Opening pressure was 28 cm of water, c/w increased intracranial pressure and pseudotumor cerebri. This problem was initially though to be due to tetracycline usage, so TCN was discontinued and Diamox begun. The Diamox doses have been changed over time.    3). On 01/03/19 he had an LP that showed elevated pressure. His diamox dose was increased somewhat. Dr. Brett Fairy also started him on Topamax. He had a follow up visit with Dr. Brett Fairy on 02/23/20. She continued him on low doses of both medications. He still has some adverse effects of the Diamox, but not as bad. He still has some mild fatigue.  G. Adult ADD/depression: [name] was evaluated by Dr Carlena Hurl, a noted neuropsychiatrist in [place] in 2018. At his last follow up visit in November 2018, Dr. Wylene Simmer decided to wait until [name]'s overall health situation  stabilized before trying any new medications.   H. General physical and emotional health: [name]  has been very sick for much of the past 3 years. He has been unable to work full-time and has become almost confined to his home. He has become increasingly anhedonic over time.   I. Neck pains, decreased cervical range of motion, and shoulder girdle pains:   1). Several months ago he began to develop pains in his left upper chest and shoulder area. He saw Dr. Oneida Alar. The pains then went into his right upper chest and shoulder, then into his shoulder blades. Now he can't bend his neck. [place] of his left shoulder was negative.    2). He missed his follow up appointment with Dr. Oneida Alar due to other conflicting medical appointments. I asked him to  re-schedule that appointment.  J. He had an MRI and an MRA of his brain. The MRI of the brain and brainstem down to C3 was normal. The MRA showed the following:  Moderate stenosis of the M1 segment of the left middle cerebral artery.  Negligible flow through the A1 segment of the left anterior cerebral artery.  This could be due to a hypoplastic/aplastic segment, a normal variant.  This could also be due to severe stenosis.  The left A2 segment derived this flow from the right to through the anterior communicating artery a "moderate stenosis of the M1 segment of the left middle cerebral artery: He then had an angiography procedure to evaluate his bilateral carotid  And innominate arterial circulation. That study showed: Approximately 65-70% stenosis of the left internal carotid artery supraclinoid segment secondary to a mildly irregular intracranial arteriosclerotic plaque. No opacification of the left anterior cerebral A1 segment most likely represents a pathologic intracranial occlusion. 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 Approximately 10 mm x 8.3 mm pyramidal configured high-riding jugular bulb. [name] was told to" take 325 mg of ASA daily as a secondary stroke prevention strategy".  K. Following the angiography procedure he developed bilateral palmar hand numbness. Fortunately, these symptoms are improving   L. He has an asthma consultation with Ms Juliann Pares, [place], on 07/24/20. He had mild restrictive disease, essentially unchanged.  M. He had an evaluation by Dr. Celine Ahr, [facility], on 06/19/20 and a follow up visit on 10.01/21. The cardiologist recommended continuing his  ASA and statin therapy, but did not feel any cardiology follow up was needed.   [name] is followed by [facility] for Idiopathic Intracranial Hypertension (IIH). He  saw his neurologist at [facility] on 10/15/20. MRA of the brain did not add any additional info. [name] had a follow up visit on 01/03/21.  O. On 01/30/21, I saw him as an urgent add-on because Dr. Karlton Lemon saw [name] in the [facility] that day and thought that [name] looked bad and needed to be admitted. When I saw [name] I agreed. I contacted the Internal Medicine Teaching Service and had [place] admitted for evaluation and management.    1). During that admission from 01/30/21-02/02/21, [name] was thoroughly evaluated. TSH was 1.23, free T4 1.1, free T3 3.6; 25-OH vitamin D was 51.    2). To rule out autoimmune adrenal insufficiency (Addison's disease), two sets of ACTH and cortisol values were performed. On 01/30/21 at 1:56 PM, his ACTH was 23 (ref 6-50 in the AM) and his cortisol was 16.5 (ref 4-22 at 8 AM, 3-17 at 4 PM). On 01/31/21 at  7:24 AM his ACTH was 29.9 (ref 7.2-63.3 in the AM) and his cortisol was 6.5 (ref 6.7-22.6 in the AM). Although the ACTH values were well within normal limits, which would tend to exclude adrenal cortical insufficiency, he had one normal cortisol and one low cortisol. To further evaluate his hypothalamic-pituitary-adrenal (HPA) axis, an ACTH stimulation test was performed on 02/01/21 at 8:21 AM. Baseline cortisol was normal at 8.9. +30 minute cortisol was normal at 21.6. +60 minute cortisol was normal at 24.6. This very brisk and normal response ruled out adrenal insufficiency.    3). To rule out celiac disease, a tissue transglutaminase IgA and IgA were drawn. The tTG IgA was normal at <1.0. The IgA was normal at 136 (ref 47-310). Dr. Ardis Hughs also performed an endoscopy on 01/28/21 and took biopsies of the small stomach and small intestine. The gastric biopsies were negative for H. Pylori and the small intestinal biopsies were negative for celiac disease.                   4). To rule out an extrapancreatic insulinoma, a C-peptide was obtained on 01/30/21. The value was very low at <0.1  (ref 1.1-4.4), c/w his autoimmune T1DM.              5). To rule out a pancreatic tumor, an abdominal MRI was performed with and without contrast on 01/31/21. The pancreas was "moderately thin/atrophic", similar to a CT scan performed on 03/10/18. "There was no abnormal pancreatic parenchymal enhancement.   6). To rule out exocrine pancreas insufficiency, three tests were performed on 01/31/21.  The serum lipase was normal at 22 (ref 11-51). The serum amylase was low at 23 (ref 28-100). The stool pancreatic elastase was markedly low at 76 (ref >200). The low serum amylase and the very low stool elastase confirmed the clinical suspicion of pancreatic exocrine deficiency. Unfortunately, we do not know the cause of this exocrine insufficiency.    7). [name] was started on Creon and discharged on 02/02/21. His initial Creon dose was one 36,000 unit capsule by mouth with meals.   P. On 02/12/21 he had a CT/MRA scan of his head and neck. The brain was normal. He had mild soft plaque at the ICA bulb. No stenosis was seen. He had a 50% stenosis of the left supraclinoid ICA. There was no other intracranial large of medium vessel occlusion of correctable proximal stenosis. No patent A1 segment was seen on the left. Both anterior cerebral arteries receive their blood supply from the right carotid circulation.   Q. He met with Dr. Ardis Hughs in GI on 02/26/2021. Dr. Ardis Hughs increased the Creon doses to 3 with meals and 2 with snacks. Dr. Ardis Hughs referred [name] to [facility] for assistance in dietary management of his pancreatic insufficiency, .   3. [name]'s last [facility] visit was on 02/27/2021. At that visit [name] had still been feeling "pretty rough", but maybe a little better.  A. In the interim,    1). His BGs are generally higher, but he has had BGs down to 42.  He continues to have site issues due to his loss of fat weight. He only has the metal sites now, so he is having difficulty using his buttocks.    2).  He is able to eat more normally now, but is afraid to eat much at any one time, so he is still losing weight. He consciously eats more at lunch and less at other meals. He is  consciously taking in more protein and increasing the carbs at lunch and some other meals to 30-35 grams. He is not having much abdominal bloating. He is not having any diarrhea, but does have occasional abdominal cramps. He feels like he is still losing strength. His joints still hurt.  B.  On 03/04/21 [name] had a follow up visit with Dr. Barbaraann Barthel in Sports Medicine for evaluation and management of muscle pain. Dr. Barbaraann Barthel recommended aquatic therapy and a gradually and progressively increasing walking plan to help [name] regain his strength.   C. On 03/04/21 [name] also had avisit wirh Dr. Zadie Rhine in Ophthalmology.    E. [name] was previously found to have vitamin D level of 10. He is now taking 50,00 IU/week.   F. He continues to see his therapist, Ms. [facility], every week. He is using alprazolam, 0.25 mg at bedtime when needed for anxiety. His PCP will prescribe the medication for him.  He thinks the medication and the therapy are helping him.   G. His nausea only occurs occasionally. He does not usually take omeprazole.   H. He continues to have problems with lactose intolerance. When he avoids lactose, however, he is asymptomatic. Some foods such as shellfish cause his throat to swell up.   I. He takes losartan, 50 mg/day. He rarely has any coughing unless his allergies act up. He also takes atorvastatin, 20 mg/day.    J. He had an ophthalmology appointment at [facility] 10/23/20. He saw the [facility] neuro-retinal specialist on 12/13/20.  There was no evidence of progression.   K. His insurance plan will not allow him to have any more than 200 BG test strips per month.   4. Pertinent Review of Systems: Constitutional: [name] feels "tired, worn out, and weak". His sleep quality is "not great, but better".  Eyes: As above. He still has the  "sparkles" in his eyes essentially all the time. The visual color changes, muscle spasms, and tingling that were attributed to Diamox still bother him, but less since reducing the Diamox dose. Neck: He has not had any recent anterior neck swelling and tenderness.  Lungs: Breathing has been "okay", but he still sometimes has SOB with exertion.  Heart: He thinks his heart beats about the same. Gastrointestinal: As above. Bowel movements have been better. Hands: All of his joints hurt.   Legs: As above.  Muscle mass and strength seem lower. There are no other complaints of numbness, tingling, burning, or pain. No edema is noted. Feet: There are more pains in his soles. There are no complaints of numbness, or burning.  No edema is noted. Hypoglycemia: He has had some low BG alerts.  Emotional: "I'm worn out."    5. Pump printout: He changes sites every 1-2 days. Average BG is 185, compared with 167 at his last visit and with 156 at his prior visit.  He checks BGs 8-14 times per day. BG range was about 145-216, compared with 90-289 at his last visit.    6. CGM printout: He has been in auto mode 98%. He wears his sensor 96%. Average SG is 159, compared with 154 at his last visit and with 155 at his last prior. Time in Range is 73%, compared with 80% at his last visit and with 79% at his prior visit. Time above range is 24%, compared with 20% at his last visit. SGs average about 180 at midnight. SGs decrease to about 100 about 10 AM to 2 PM, then increased to about  230 when he has a bigger lunch. SGS are about 160 in the evening.   PAST MEDICAL, FAMILY, AND SOCIAL HISTORY:  Past Medical History:  Diagnosis Date  . ADHD (attention deficit hyperactivity disorder)   . Anxiety   . Asthma   . Autonomic neuropathy due to diabetes ([place])   . Chronic headaches   . Diabetes ([place])   . Fatigue   . GERD (gastroesophageal reflux disease)   . Goiter   . High blood pressure   . High cholesterol   .  Hypercholesterolemia   . Hypertension   . Hypoglycemia associated with diabetes ([place])   . Malnutrition ([place])   . Obesity   . Sleep apnea    mild, no cpap  . Tachycardia   . Type 1 diabetes mellitus not at goal [facility])   . Uncontrolled DM with microalbuminuria or microproteinuria     Family History  Problem Relation Age of Onset  . Dementia Paternal Aunt   . Schizophrenia Paternal Aunt   . Cancer Maternal Grandmother        type unknown  . Diabetes Maternal Grandmother        T2 DM  . Cancer Maternal Grandfather        type unknown  . Irritable bowel syndrome Mother   . Heart disease Father   . Hypertension Father   . Hyperlipidemia Father   . Skin cancer Father   . Thyroid disease Sister   . Stomach cancer Paternal Aunt   . Pancreatic cancer Paternal Aunt   . Diabetes Maternal Aunt   . Allergic rhinitis Neg Hx   . Angioedema Neg Hx   . Asthma Neg Hx   . Atopy Neg Hx   . Eczema Neg Hx   . Immunodeficiency Neg Hx   . Urticaria Neg Hx   . Esophageal cancer Neg Hx   . Colon cancer Neg Hx   . Colon polyps Neg Hx      Current Outpatient Medications:  .  acetaZOLAMIDE (DIAMOX) 250 MG tablet, Take 250 mg by mouth 2 (two) times daily., Disp: , Rfl:  .  albuterol (PROAIR HFA) 108 (90 Base) MCG/ACT inhaler, Inhale 2 puffs into the lungs every 6 (six) hours as needed for wheezing or shortness of breath. (Patient not taking: Reported on 02/27/2021), Disp: 1 Inhaler, Rfl: 2 .  albuterol (PROVENTIL) (2.5 MG/3ML) 0.083% nebulizer solution, Take 3 mLs (2.5 mg total) by nebulization every 4 (four) hours as needed for wheezing or shortness of breath. (Patient not taking: Reported on 02/27/2021), Disp: 75 mL, Rfl: 1 .  ALPRAZolam (XANAX) 0.25 MG tablet, Take 0.25 mg by mouth at bedtime., Disp: , Rfl:  .  aspirin EC 81 MG tablet, Take 162 mg by mouth daily as needed for moderate pain. Swallow whole. (Patient not taking: Reported on 02/27/2021), Disp: , Rfl:  .  atorvastatin (LIPITOR) 20 MG  tablet, Take 20 mg by mouth daily. , Disp: , Rfl: 0 .  AUVI-Q 0.3 MG/0.3ML SOAJ injection, Use as directed for life-threatening allergic reaction. (Patient not taking: Reported on 02/27/2021), Disp: 2 each, Rfl: 2 .  budesonide (PULMICORT) 0.5 MG/2ML nebulizer solution, Take 2 mLs (0.5 mg total) by nebulization as directed. (Patient not taking: Reported on 02/27/2021), Disp: 2 mL, Rfl: 3 .  budesonide-formoterol (SYMBICORT) 160-4.5 MCG/ACT inhaler, Inhale 2 puffs into the lungs 2 (two) times daily. (Patient not taking: Reported on 02/27/2021), Disp: 1 Inhaler, Rfl: 5 .  calcium carbonate (OSCAL) 1500 (600 Ca) MG TABS  tablet, Take 600 mg of elemental calcium by mouth once a week., Disp: , Rfl:  .  Continuous Blood Gluc Receiver (DEXCOM G6 RECEIVER) DEVI, 1 Device by Does not apply route as directed. (Patient not taking: Reported on 02/27/2021), Disp: 1 each, Rfl: 2 .  Continuous Blood Gluc Sensor (DEXCOM G6 SENSOR) MISC, SMARTSIG:1 Each Topical Every 10 Days, Disp: , Rfl:  .  Glucagon (BAQSIMI TWO PACK) 3 MG/DOSE POWD, Place 1 spray into the nose as directed. (Patient not taking: Reported on 02/27/2021), Disp: 2 each, Rfl: 3 .  glucose blood test strip, Use to check blood sugar up to 10x daily (Patient not taking: Reported on 02/27/2021), Disp: 300 each, Rfl: 11 .  HUMALOG 100 UNIT/ML injection, Use 300 units in insulin pump every 48 hours, Disp: 120 mL, Rfl: 1 .  insulin lispro (INSULIN LISPRO) 100 UNIT/ML KwikPen Junior, Inject up to 50 units daily per provider instructions (Patient not taking: Reported on 02/27/2021), Disp: 15 mL, Rfl: 11 .  Insulin Pen Needle (PEN NEEDLES) 32G X 4 MM MISC, Use to inject insulin 6x per day (Patient not taking: Reported on 02/27/2021), Disp: 200 each, Rfl: 5 .  levocetirizine (XYZAL) 5 MG tablet, Take 1 tablet (5 mg total) by mouth daily as needed for allergies. (Patient not taking: Reported on 02/27/2021), Disp: 30 tablet, Rfl: 5 .  lipase/protease/amylase (CREON) 36000 UNITS CPEP  capsule, 3 pills with every meal and 2 pills with every snack, Disp: 390 capsule, Rfl: 11 .  losartan (COZAAR) 50 MG tablet, TAKE 1 TABLET(50 MG) BY MOUTH DAILY, Disp: 90 tablet, Rfl: 1 .  Mometasone Furoate [facility] HFA) 200 MCG/ACT AERO, For asthma flare, begin Asmanex 200-2 puffs twice a day for 1-2 weeks or until cough and wheeze free (Patient not taking: Reported on 02/27/2021), Disp: 13 g, Rfl: 5 .  montelukast (SINGULAIR) 10 MG tablet, TAKE 1 TABLET BY MOUTH EVERY NIGHT AT BEDTIME, Disp: 30 tablet, Rfl: 5 .  Multiple Vitamin (MULTIVITAMIN WITH MINERALS) TABS tablet, Take 1 tablet by mouth daily. (Patient not taking: Reported on 02/27/2021), Disp: , Rfl:  .  NUCALA 100 MG/ML SOAJ, INJECT $RemoveBefo'100MG'[id number]$  SUBCUTANEOUSLY EVERY 30 DAYS (GIVEN AT MD  OFFICE), Disp: 1 mL, Rfl: 11 .  omeprazole (PRILOSEC) 20 MG capsule, Take 1 capsule (20 mg total) by mouth daily., Disp: 30 capsule, Rfl: 11 .  topiramate (TOPAMAX) 50 MG tablet, TAKE 1 TABLET(50 MG) BY MOUTH TWICE DAILY (Patient not taking: Reported on 02/27/2021), Disp: 60 tablet, Rfl: 5 .  Vitamin D, Ergocalciferol, (DRISDOL) 1.25 MG (50000 UNIT) CAPS capsule, Take 50,000 Units by mouth every 7 (seven) days., Disp: , Rfl:   Current Facility-Administered Medications:  [name]  Mepolizumab SOLR 100 mg, 100 mg, Subcutaneous, Q28 days, Valentina Shaggy, MD, 100 mg at 02/16/19 1018  Facility-Administered Medications Ordered in Other Visits:  .  gadopentetate dimeglumine (MAGNEVIST) injection 20 mL, 20 mL, Intravenous, Once PRN, Dohmeier, Asencion Partridge, MD  Allergies as of 03/12/2021 - Review Complete 03/05/2021  Allergen Reaction Noted  . Flonase [fluticasone] Shortness Of Breath 06/10/2017  . Sulfa antibiotics  12/10/2020  . Molds & smuts Other (See Comments) 03/09/2018  . Sulfites Itching and Swelling 02/04/2018  . Tetracyclines & related Other (See Comments) 03/09/2018  . Lactose intolerance (gi) Other (See Comments) 01/05/2021  . White birch Cough 04/10/2020    1.  Work and Family: He can no longer work part-time at home. 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  2.  Activities: He walks every day for short distances, but is in pain.  3. Smoking, alcohol, or drugs: He has not been drinking any alcohol or beer. No tobacco or drugs. 4. Primary Care Provider: His PCP is Ms Mattie Marlin, PA, [facility].  5. Neurology: Dr. Brett Fairy was his neurologist, but now he goes to [facility].  6. Ophthalmology:  7. Retinology: Dr. Zadie Rhine 8. Urology: Dr. Jeffie Pollock at [facility] 9. Sports medicine: Dr. Oneida Alar and Dr. Barbaraann Barthel 10. Therapy: Ms Rea [employer], RN, MSN  REVIEW OF SYSTEMS: There are no other significant problems involving [name]'s other body systems.   Objective:  Vital Signs:  There were no vitals taken for this visit. BP 112/82   Ht Readings from Last 3 Encounters:  03/04/21 5' 11.75" (1.822 m)  02/26/21 $RemoveB'5\' 11"'[id number]$  (1.803 m)  02/21/21 6' (1.829 m)   Wt Readings from Last 3 Encounters:  03/04/21 206 lb (93.4 kg)  02/27/21 206 lb 6.4 oz (93.6 kg)  02/26/21 210 lb (95.3 kg)    PHYSICAL EXAM:  Constitutional: The patient appears overweight, tired, weak, and sick. He has lost 5 pounds on our scales since his last visit. He looks better, in that he is more engaged, his verbal volume is better, and he does not appear as weak as he did. His affect is subdued and depressed. His insight is fair.   Eyes: There is no arcus or proptosis.  Mouth: The oropharynx appears normal. The tongue appears normal. There is normal oral moisture. There is no obvious gingivitis. There is no oral hyperpigmentation.  Neck: There are no bruits present. The thyroid gland appears enlarged. The gland is enlarged, but smaller, at about 21 grams in size. The consistency of the thyroid gland is normal. There is no thyroid tenderness to palpation. Lungs: The lungs are clear. Air movement is good. Heart: The heart rhythm and rate appear  normal. Heart sounds S1 and S2 are normal. I do not appreciate any pathologic heart murmurs. Abdomen: The abdomen is enlarged. Bowel sounds are normal. The abdomen is soft. There is very mild tenderness to palpation around his umbilicus. There is no obviously palpable hepatomegaly, splenomegaly, or other masses.  Arms: Muscle mass appears appropriate for age.  Hands: There is no obvious tremor. Phalangeal and metacarpophalangeal joints are tender to compression. Palms are normal. There is no palmar hyperpigmentation.  Legs: Muscle mass appears appropriate for age. There is no edema.  Neurologic: Muscle strength is low for age and gender  in both the upper and the lower extremities. Muscle tone appears normal. Sensation to touch is normal in the legs. Skin: Thre is no significant hyperpigmentation.  LAB DATA:   Labs 02/27/21: CBG 165  Labs 01/30/21: HbA1c 6.3%, CBG 163; Urine glucose negative, ketones negative  Labs 12/25/20: CBG 199   Labs 2/18-2/19/22: CBG 285; CBC normal; U/A >500 glucose, negative ketones; BMP normal, except CO2 20 and glucose 294; CK 77 (ref 49-397); hepatic panel normal  Labs 12/05/20: HbA1c 7.5%, CBG 165; BHOB  0.2 (ref 0.04-0.27); venous pH 7.284;   Labs 08/16/20: HbA1c 6.2%, CBG 215  Labs 05/14/20: HbA1c 6.5%, CBG 176; TSH 2.44, free T4 1.1, free T3 3.5; urinary microalbumin/creatinine ratio 2 (ref <30)  Labs 04/17/20: ESR 6 (ref 0-15), CK 82 (ref 49-439)  Labs 07/12/19: HbA1c 6.4%, CBG 231  Labs 04/26/19: HbA1c 6.8%, CBG 230  Labs 12/30/18: TSH 2.07, free T4 1.1, free T3 3.2, TPO antibody <1, thyroglobulin antibody <1; CMP normal, except glucose  191; cholesterol 165, triglycerides 138, HDL 33, LDL 106; urinary microalbumin/creatinine ratio 5; vitamin B1 10 (ref 8-30), vitamin B6 3.4 (ref 2.1-21.7), vitamin B12 451 (ref [id number])  Labs 11/29/18: CBG 179  Labs 11/29/18: CMP with glucose 198, chloride 109 (ref 96-106, CO2 17 (ref 20-29); CBC with WBC 11.6 (ref 3.4-10.8),  PMNs 8.6 (ref 1.4-7.0), and monocytes 1.0 (ref 0.1-0.9)  Labs 10/28/18: HbA1c 6.9%, CBG 155  Labs 10/06/18: ANA titer negative, ANCA titers negative, rheumatoid factor negative, cyclic citrullin peptide 7 (ref 0-190  Labs 07/27/18: CBG 193  Labs 05/31/18: HbA1c 6.6%, CBG 202  Labs 03/18/18: CBG 178  Labs 01/13/18: HbA1c 6.8%, CBG 194; TSH 0.98, free T4 1.3; free T3 3.6; CMP normal except glucose 191; urinary microalbumin/creatinine ratio 35 (ref <30)  Labs 11/17/17: CBG 170  Labs 10/13/17: HbA1c 6.8%, CBG 191  Labs 08/24/17: CBG 117  Labs 07/20/17: CBG 216  Labs 07/08/17: IgA, IgG, and IgM normal. CBC normal.  Labs 06/16/17: HbA1c 6.5%, CBG 193  Labs 05/15/17: CBG 209; TSH 0.90, free T4 1.2, free T3 3.8  Labs 03/05/17: HbA1c 7.3%, CBG 241  Labs 02/20/17: CBG 180 fasting  Labs 01/29/17: CBG 148  Labs 01/12/17: CBG 116  Labs 12/29/16: HbA1c 9.1%, CBG 223; TSH 0.62, free T4 1.1, free T3 3.5; urine microalbumin/creatinine ratio 22; cholesterol 171, triglycerides 93, HDL 40, LDL 112;  CMP glucose 201  Labs 09/26/16: HbA1c 9.5%  Labs 08/13/16: Celiac panel negative; CMP normal except for glucose 207; CBC normal  Labs 06/13/16: HbA1c 9.8%; LH 3.6, FSH 2.3, testosterone 533 (ref 250-827), free testosterone 59.4 (ref 47-244)  Labs 02/11/16: HbA1c 9.2%  Labs 01/07/16: HbA1c 10.5%; CBC with Hgb elevated at 17.2%; LH 3.9, FSH 2.0; TSH 1.53, free T4 1.3, free T3 3.7; cholesterol 262, triglycerides 171, HDL 40, LDL 188; urinary microalbumin/creatinine ratio 31; CMP normal except for glucose 181  Labs 06/29/15. HbA1c 9.6%  Labs 12/28/14: Hemoglobin A1c 9.5% today, compared with 9.5%, at last visit and with 9.9% at the prior visit.  Labs 08/03/14: HbA1c 9.5%; TSH 0.548,free T4 1.10, free T3 3.7, TPO antibody < 1, TSI 30; CMP normal except glucose 191; urinary microalbumin/creatinine ratio 27; cholesterol 260, triglycerides 90, HDL 51, LDL 191   Labs 10/05/13: HbA1c 9.9%  Labs 04/23/13: CMP  normal, except glucose 144; cholesterol 216, triglycerides 97, HDL 39, LDL 158; urinary microalbumin/creatinine ratio 6.9; TSH 0.505, free T4 1.17, free T3 3.8  Labs: 01/21/12: TSH 1.251, free T4 1.10, free T3 3.6, CBC normal, CMP normal except for glucose 206, urinary microalbumin/creatinine ratio 6.3, cholesterol 187, triglycerides 67, HDL 45, LDL 129   Assessment and Plan:   ASSESSMENT:  1. T1DM:   A. In 2021, his SGs had been mostly <200, reflecting fairly good DM control, but at the cost of severely curtailing his food intake. [name] had done very well at trying to optimize his BG control. This level of BG control without significant hypoglycemia in October 2021 was a testament to the power of using auto mode in insulin pump-CGM technology and to [name]'s determination to keep his BGs under control.  B. Unfortunately, thereafter he had many site problems and sensor problems. BGs and HbA1c were much higher in February 2022. His HbA1c increased to 7.5%%, compared to 6.2% in October 2021. He was also not having too many low BGs in October.  C. In early April 2022, however, the HbA1c had decreased to 6.3% and he was having more lower BGs, despite severely reducing the amount of  insulin he took daily. As noted above, he was diagnosed with pancreatic exocrine deficiency.   D. Since starting and increasing Creon, he feels somewhat better. BGs are somewhat higher. He is very carefully and hesitantly increasing his food intake.  2. Hypoglycemia: He has had a few low BGs this month. He is slowly increasing his carb intake.   3. Hypertension: His BP is normal today.  4. Combined hyperlipidemia:   A. His lipids in March 2017 were too high, but the lipids in March 2018 were much better on atorvastatin. He discontinued the atorvastatin in mid-2018 due to concern that this medication might be causing his neuromuscular symptoms.   B. Ms Frederico Hamman re-started his atorvastatin several months ago. His lipids in March  2022 were worse. We will follow up on this issue after his pancreatic exocrine insufficiency stabilizes. 5. Autonomic neuropathy with tachycardia and gastroparesis: His gastroparesis has improved in parallel with his BG improvement. His heart rate has improved as well.  6. Obesity: Weight has decreased more. He is slowly trying to eat more.  7. Goiter:  A. His thyroid gland had shrunk back to normal size at a previous visit, but had increased slightly at his last two visits and remains slightly enlarged today. His thyroiditis is clinically quiescent.   B. The active thyroiditis that he has had in the past and the  process of waxing and waning of thyroid gland size are c/w evolving Hashimoto's thyroiditis.   C. He was euthyroid in March 2013, but was borderline hyperthyroid in June 2014. He was euthyroid in October 2015, March 2017, March 2018, July 2018, in March 2019, in March 2020, in July 2021, and in April 2022.  9. Fatigue: This problem is much worse today..   10. Obstructive sleep apnea: He did not meet the criteria for OSA, but because of his snoring a dental appliance was suggested. I recommended that he obtain a dental appliance. Unfortunately, his former dentist no longer accepts [employer], so [name] has to find a new dentist. 52. Adult ADD: He had an appointment with Dr. Carlena Hurl, MD in [place] in November 2018. Dr. Wylene Simmer is waiting for [name]'s medical situation to stabilize before beginning any new psych meds.   12. Proliferative diabetic retinopathy: As above.  13. URI/nasal congestion/vertigo/allergic rhinitis and conjunctivitis: He is not having these problems today. The monthly injections have really helped him.   14. Neuromuscular symptoms and headaches/increased intracranial pressure:   A. It appeared previously that some of [name]'s headaches were classic tension headaches. When he performed cervical stretching exercises the headache resolved.   B. In the past  when Dr. Brett Fairy evaluated [name] for his vague neuromuscular symptoms, she did not find any signs of any serious neurologic disease. His MRI of his brain was unremarkable. At that time his neuromuscular symptoms had resolved.  C. Later, however, the headaches worsened and some of the neuromuscular symptoms recurred. We now know that he had increased ICP and that his headaches improved for a brief time after the LP. He then developed a typical spinal tap headache.  D. In retrospect he may have had intermittent problems with elevated ICP previously.  E. Dr. Brett Fairy suspects that [name]'s ICP was due to the doxycycline.  F. He appeared to have problems taking his prior dose of Diamox, but is doing better on the combination of a lower dose of Diamox and Topamax.   G. He is being evaluated at [facility] neurology now.  15. Calcified lymph  node: It is possible that he may have contracted a fungus while he was working in the [employer]. several years ago. His thyroid US showed his goiter, but no intrinsic thyroid abnormality. A partially calcified cervical lymph node on the right was noted, c/w a post infections/inflammatory node. 16. Microalbuminuria: His ratio was mildly elevated in March 2019. He needed tighter BG and BP control. His ratio in March 2020 and again in July 2021 was very good.  17. Anhedonia: [name] is more physically and emotionally depressed today. He desperately wants to be normal. His sessions with Ms Rea College, RN, MSN, a psychiatric nurse specialist, have been very beneficial. He still needs to find more sources of fun.  18. Neck pain/Cervical neuritis/neuromuscular pain: [name] has seen Dr. Oneida Alar several times. Dr. Oneida Alar had planned to obtain a neck xray if [name]'s neck did not improve. Unfortunately, [name] missed his lat appointment with Dr. Oneida Alar due to conflicting medical appointment.  I asked [name] to re-schedule his appointment with Dr. Oneida Alar.   19. Cerebral arteriosclerosis: It is  unclear to me whether the lesions seen on angiography and MRA are amenable to vascular surgical techniques. 20> Vitamin d deficiency: His 25-OH vitamin D value in April 2022 was mid-normal.   PLAN:  1. Diagnostic: We reviewed his pump and Dexcom printouts today.  2. Therapeutic: He will see Dr. Barbaraann Barthel in Sports Medicine next Monday. [name] needs a plan to gradually , but progressively increase his physical activities in terms of both cardio and strength gain. He may benefit from a PT consult and a nutrition/dietitian consult. Continue current pump settings    Basal rates: basal 1 MN:0.99 6 AM: 0.975 2 PM: 1.35 8 PM: 1.30   ISFs: 35   ICRs: MN: 7 8 AM: 4.5 11 AM: 4.5 9 PM: 7   BG targets:  MN: 130-150 6 AM: 120 10 PM 130-150  3. Patient education: We discussed all of the above at great length.  4. Follow up: 11:15 AM on 03/12/21   Level of Service: This visit lasted in excess of 70 minutes. More than 50% of the visit was devoted to coordinating care and documenting this encounter.    Tillman Sers, MD, CDE Adult and Pediatric Endocrinology

## 2021-03-11 NOTE — Progress Notes (Signed)
   Covid-19 Vaccination Clinic  Name:  [name]    MRN: [id number] DOB: Sep 26, 1981  03/11/2021  [name] was observed post Covid-19 immunization for 15 minutes without incident. He was provided with Vaccine Information Sheet and instruction to access the V-Safe system.   [name] was instructed to call 911 with any severe reactions post vaccine: [name] Difficulty breathing  . Swelling of face and throat  . A fast heartbeat  . A bad rash all over body  . Dizziness and weakness

## 2021-03-12 ENCOUNTER — Ambulatory Visit (INDEPENDENT_AMBULATORY_CARE_PROVIDER_SITE_OTHER): Payer: 59 | Admitting: "Endocrinology

## 2021-03-12 ENCOUNTER — Ambulatory Visit (INDEPENDENT_AMBULATORY_CARE_PROVIDER_SITE_OTHER): Payer: 59 | Admitting: Pharmacist

## 2021-03-12 ENCOUNTER — Encounter (INDEPENDENT_AMBULATORY_CARE_PROVIDER_SITE_OTHER): Payer: Self-pay | Admitting: Pharmacist

## 2021-03-12 VITALS — BP 110/70 | HR 96 | Wt 208.4 lb

## 2021-03-12 VITALS — BP 110/70 | HR 96 | Wt 214.9 lb

## 2021-03-12 DIAGNOSIS — K8681 Exocrine pancreatic insufficiency: Secondary | ICD-10-CM

## 2021-03-12 DIAGNOSIS — F32A Depression, unspecified: Secondary | ICD-10-CM

## 2021-03-12 DIAGNOSIS — E1043 Type 1 diabetes mellitus with diabetic autonomic (poly)neuropathy: Secondary | ICD-10-CM

## 2021-03-12 DIAGNOSIS — E559 Vitamin D deficiency, unspecified: Secondary | ICD-10-CM

## 2021-03-12 DIAGNOSIS — E1065 Type 1 diabetes mellitus with hyperglycemia: Secondary | ICD-10-CM | POA: Diagnosis not present

## 2021-03-12 DIAGNOSIS — E063 Autoimmune thyroiditis: Secondary | ICD-10-CM

## 2021-03-12 DIAGNOSIS — E10649 Type 1 diabetes mellitus with hypoglycemia without coma: Secondary | ICD-10-CM

## 2021-03-12 DIAGNOSIS — M6281 Muscle weakness (generalized): Secondary | ICD-10-CM

## 2021-03-12 DIAGNOSIS — G609 Hereditary and idiopathic neuropathy, unspecified: Secondary | ICD-10-CM

## 2021-03-12 DIAGNOSIS — E049 Nontoxic goiter, unspecified: Secondary | ICD-10-CM

## 2021-03-12 DIAGNOSIS — E1069 Type 1 diabetes mellitus with other specified complication: Secondary | ICD-10-CM

## 2021-03-12 DIAGNOSIS — R5383 Other fatigue: Secondary | ICD-10-CM

## 2021-03-12 DIAGNOSIS — F419 Anxiety disorder, unspecified: Secondary | ICD-10-CM

## 2021-03-12 LAB — POCT GLUCOSE (DEVICE FOR HOME USE): POC Glucose: 169 mg/dl — AB (ref 70–99)

## 2021-03-12 MED ORDER — FREESTYLE LIBRE 2 SENSOR MISC: 1.0000 | 11 refills | Status: DC

## 2021-03-12 NOTE — Patient Instructions (Signed)
Follow up visit in 4 weeks.  

## 2021-03-12 NOTE — Patient Instructions (Addendum)
It was a pleasure seeing you today!  Today the plan is.. 1. If you notice for > 2 days in a row that you have to enter ghost carbs to lower your blood sugar after a meal (> 2x) then you can change your insulin to carbohydrate ratio (ICR) from 10 --> 8 between 12am - 11am and/or 8 --> 7 between 11 am - 12 am.  2. Remember bad pump sites will happen if blood sugar is > 250 mg/dL for longer than 2 hours and/or if your site is leaking. 3. For pump site issues, I think we can try the stomach. You can change between using TruSteel and Autosoft infusion sets. Remember, Dexcom and Tandem pump need to be near each other for best connection.  4. I contacted chris (tandem rep) about changing your site prescription. Give this about 1-2 weeks to get more pump sites. Please contact me if it has been >1 week and you have not heard from Tandem 5. For Tandem and Dexcom connection issues I think it would best to call Tandem technical support.  6. Freestyle Libre 2.0 CGM sensors have been sent to your preferred pharmacy to use as backup 7. Remember with Creon you MUST eat at least 100 grams fat/day    Please contact me (Dr. Ladona Ridgel) at [phone] or via Mychart with any questions/concerns

## 2021-03-13 ENCOUNTER — Other Ambulatory Visit: Payer: Self-pay

## 2021-03-13 ENCOUNTER — Encounter: Payer: 59 | Attending: Family Medicine | Admitting: Dietician

## 2021-03-13 ENCOUNTER — Encounter: Payer: Self-pay | Admitting: Dietician

## 2021-03-13 VITALS — Ht 72.0 in | Wt 208.5 lb

## 2021-03-13 DIAGNOSIS — E440 Moderate protein-calorie malnutrition: Secondary | ICD-10-CM | POA: Insufficient documentation

## 2021-03-13 NOTE — Progress Notes (Signed)
Medical Nutrition Therapy  Appointment Start time:  1030  Appointment End time:  1110  Primary concerns today: Weight Gain/Increasing Calories  Referral diagnosis: E44.0 Mild protein-calorie malnutrition Preferred learning style: No preference indicated Learning readiness: Ready   NUTRITION ASSESSMENT   Anthropometrics  Ht: 6' Wt: 208.5 lbs Wt Loss: +2 lbs Body mass index is 28.28 kg/m.   Clinical Medical Hx: T1DM, Hyperglycemia, HTN, Retinopathy, Microalbuminuria, Hyperlipidemia Medications: See Chart - Insulin Pump, Dexcom  NEW: T-Link system Labs: CBG -308 (Admitted to ER for hypoglycemia, 01/30/2021) CO2 - 21 (low)  Amylase - 23 (Low) Pancreatic Elastase - 76 (Low) Notable Signs/Symptoms: Fatigue, Weight Loss   Lifestyle & Dietary Hx  Pt reports eating a ton of food previously due to uncontrolled weight loss, but has been eating less now and maintaining weight. Pt feels they are absorbing their food better. Pt states they believe the Creon is helping this. Pt was initially taking 1 Creon pill per meal, but is now taking 3 with meals, 2 with snacks, and 1 with very small snacks. Pt reports cramps and pain associated with Creon have decreased. Pt reports switching to the T-Link system has helped them stay in better glycemic control. CGM reports shows 93% sensor usage, a GMI of 7.2% and TIR of 76% with no hypoglycemia. Pt states they are having to change pump sites daily. Pt believes the loss of muscle mass in their legs has led to having to change sites more frequently.  Pt reports eating a lot of Quest protein products that utilize the claim of "Low net carbs" and has had some difficulty calculating appropriate insulin dosage to the carbs in these products.  Pt states their quality of life has finally stopped declining and they are relieved to have settled out and stopped losing weight. Pt wants to start adding in new foods, more fruits and vegetables. Pt reports history of trying  to lose weight has made it difficult to change their perspective on nutrition towards gaining healthy weight. Pt reports physical therapist recommended they get a diet plan to increase muscle mass.   Body Composition Scale Date 01/08/2021  02/06/2021  Current Body Weight 212.6 lbs 206.1 lbs  Total Body Fat % 22.1 20.8  Visceral Fat 13 12  Fat-Free Mass % 77.8 79.1   Total Body Water % 58.8 60.1  Muscle-Mass lbs 40.0 38.7  BMI 28.8 27.9  Body Fat Displacement           Torso  lbs 29.1 26.6         Left Leg  lbs 5.8 5.3         Right Leg  lbs 5.8 5.3         Left Arm  lbs 2.9 2.6         Right Arm   lbs 2.9 2.6    Estimated daily fluid intake: ~160 oz Supplements: Daily MV, 1,000mg  Vit D weekly, B12 Sleep: Difficulties Stress / self-care: Fear of food a/w hyperglycemia Current average weekly physical activity: ADLs, starting physical therapy  24-Hr Dietary Recall First Meal: sausage and eggs, toast Snack: none Second Meal: Eggs and [place], chicken brat in a bun Snack: 1/4 protein donut Third Meal: 3 hard shelled tacos with ground [place], salsa, lettuce Snack: 3 [place] snack sticks Beverages: water  Estimated Energy Needs Calories: 2200 Protein: 115 g   NUTRITION DIAGNOSIS  NB-1.1 Food and nutrition-related knowledge deficit As related to protein-calorie malnutriton.  As evidenced by pt reported inability to eat, ~  900 Kcal a day intake, and fear of eating related to adverse glycemic response related to difficulties with insulin pump administration .   NUTRITION INTERVENTION  Nutrition education (E-1) on the following topics:  Educated patient on the pathophysiology of diabetes. This includes why our bodies need circulating blood sugar, the relationship between insulin and blood sugar, and the results of insulin resistance and/or pancreatic insufficiency on the development of diabetes. Educated patient on factors that contribute to elevation of blood sugars, such as stress,  illness, injury,and food choices. Discussed the role that physical activity plays in lowering blood sugar. Educate patient on the three main macronutrients. Protein, fats, and carbohydrates. Discussed how each of these macronutrients affect blood sugar levels, especially carbohydrate, and the importance of eating a consistent amount of carbohydrate throughout the day. Educated patient on carbohydrate counting, 15g of carbohydrate equals one carb choice. Advised patient on the importance of consistently checking their blood sugar, and recognizing how lifestyle and food choices affect those numbers. Educated patient on the "Rule of 15". Treat low blood sugar, below 70 mg/dL, by eating 69S of fast acting carbohydrate and rechecking blood sugar after 15 minutes. If blood sugar is still low, repeat. Discussed the role of diabetes medications in getting blood sugar under control, and considerations that may need to be taken to avoid complications. Educated patient about the exocrine function of the pancreas and the nutritional implications of exocrine insufficiency. Educated patient on the changes their body will go through when implementing PERT (Creon).  Handouts Provided Include   Yellow Meal Card  Balanced Plate Food List  Learning Style & Readiness for Change Teaching method utilized: Visual & Auditory  Demonstrated degree of understanding via: Teach Back  Barriers to learning/adherence to lifestyle change: Insulin pump complications  Goals Established by Pt  Work to consistently eat 100 g of fats, mostly unsaturated, each day while taking Creon.   Choose nut butters, seafood, plant based oils, flax or chia seeds, and avocados for sources of healthy unsaturated fats.   Have a source of fast digesting carbs and fast digesting protein within 30 minutes of finishing your physical therapy.   Try Fairlife chocolate milk, or having whey protein powder in orange juice.   Aim for ~15-30g of carbs, and  ~15-20g of protein  Consider purchasing "The Bodybuilding Meal Prep Cookbook: Macro Friendly Meals to Prepare, Grab, and Go" for recipe ideas to aid in lean mass development.   MONITORING & EVALUATION Dietary intake, weekly physical activity, and glycemic control in 2 months.  Next Steps  Patient is to monitor blood sugar very closely while increasing calories, log foods, and bring reports to follow up with RD.

## 2021-03-13 NOTE — Patient Instructions (Addendum)
Work to consistently eat 100 g of fats, mostly unsaturated, each day while taking Creon.  Choose nut butters, seafood, plant based oils, flax or chia seeds, and avocados for sources of healthy unsaturated fats.   Have a source of fast digesting carbs and fast digesting protein within 30 minutes of finishing your physical therapy.  Try Fairlife chocolate milk, or having whey protein powder in orange juice.  Aim for ~15-30g of carbs, and ~15-20g of protein  Consider purchasing "The Bodybuilding Meal Prep Cookbook: Macro Friendly Meals to Prepare, Grab, and Go" for recipe ideas to aid in lean mass development.

## 2021-03-18 ENCOUNTER — Encounter (INDEPENDENT_AMBULATORY_CARE_PROVIDER_SITE_OTHER): Payer: Self-pay

## 2021-03-18 ENCOUNTER — Other Ambulatory Visit: Payer: Self-pay

## 2021-03-18 ENCOUNTER — Ambulatory Visit: Payer: 59 | Admitting: Endocrinology

## 2021-03-18 VITALS — BP 110/70 | HR 89 | Ht 72.0 in | Wt 208.3 lb

## 2021-03-18 DIAGNOSIS — E1069 Type 1 diabetes mellitus with other specified complication: Secondary | ICD-10-CM | POA: Diagnosis not present

## 2021-03-18 NOTE — Progress Notes (Signed)
Subjective:    Patient ID: [name], male    DOB: 10-26-1981, 40 y.o.   MRN: [id number]  HPI pt is referred by Dr Christella Hartigan, for diabetes.  Pt states DM was dx'ed in 2001; it is complicated by DR, DN, AN, and PN; he has been on insulin since dx; pt says his diet and exercise are not good; he has never had pancreatitis or pancreatic surgery.  He had DKA in 2020.  He has lost 25 lbs x 2 months.  He uses T-slim pump.  He attributed severe hypoglycemia to running out of creon.  He has mild hypoglycemia approx twice per week.  This happens late morning.  He says this is due to missing meals, even if he skips that bolus.  He says sensors are not working properly.  He is having to turn Control IQ off and on.  He takes these settings: basal rate of units/hr, except for 1.1 units/hr, when not in Control IQ bolus of 1 unit/10grams carbohydrate, except 1 unit/8 grams 12N-5PM.  correction bolus (which some people call "sensitivity," or "insulin sensitivity ratio," or just "isr") of 1 unit for each 50 by which your glucose exceeds 100. I reviewed continuous glucose monitor data.  Glucose varies from 110-200.  It increases 11AM-1PM, and decreases overnight.   TDD is 38 units (78% basal).  Control IQ is on 85% of the time Past Medical History:  Diagnosis Date  . ADHD (attention deficit hyperactivity disorder)   . Anxiety   . Asthma   . Autonomic neuropathy due to diabetes (HCC)   . Chronic headaches   . Diabetes (HCC)   . Fatigue   . GERD (gastroesophageal reflux disease)   . Goiter   . High blood pressure   . High cholesterol   . Hypercholesterolemia   . Hypertension   . Hypoglycemia associated with diabetes (HCC)   . Malnutrition (HCC)   . Obesity   . Sleep apnea    mild, no cpap  . Tachycardia   . Type 1 diabetes mellitus not at goal [facility])   . Uncontrolled DM with microalbuminuria or microproteinuria     Past Surgical History:  Procedure Laterality Date  . EYE SURGERY Right  05/30/2020   Vitrectomy, Dr. Luciana Axe  . EYE SURGERY  04/2020  . IR 3D INDEPENDENT WKST  07/16/2020  . IR ANGIO INTRA EXTRACRAN SEL COM CAROTID INNOMINATE BILAT MOD SED  07/16/2020  . IR ANGIO VERTEBRAL SEL SUBCLAVIAN INNOMINATE UNI L MOD SED  07/16/2020  . IR ANGIO VERTEBRAL SEL VERTEBRAL UNI R MOD SED  07/16/2020  . IR US GUIDE VASC ACCESS RIGHT  07/16/2020  . REFRACTIVE SURGERY     x9  . WISDOM TOOTH EXTRACTION      Social History   Socioeconomic History  . Marital status: Single    Spouse name: Not on file  . Number of children: 0  . Years of education: Not on file  . Highest education level: Not on file  Occupational History  . Occupation: Geophysical data processor  Tobacco Use  . Smoking status: Never Smoker  . Smokeless tobacco: Never Used  Vaping Use  . Vaping Use: Never used  Substance and Sexual Activity  . Alcohol use: No  . Drug use: No  . Sexual activity: Not on file  Other Topics Concern  . Not on file  Social History Narrative   ** Merged History Encounter **       Social Determinants of Health  Financial Resource Strain: Not on file  Food Insecurity: Not on file  Transportation Needs: Not on file  Physical Activity: Not on file  Stress: Not on file  Social Connections: Not on file  Intimate Partner Violence: Not on file    Current Outpatient Medications on File Prior to Visit  Medication Sig Dispense Refill  . acetaZOLAMIDE (DIAMOX) 250 MG tablet Take 250 mg by mouth 2 (two) times daily.    [name]. albuterol (PROAIR HFA) 108 (90 Base) MCG/ACT inhaler Inhale 2 puffs into the lungs every 6 (six) hours as needed for wheezing or shortness of breath. 1 Inhaler 2  . albuterol (PROVENTIL) (2.5 MG/3ML) 0.083% nebulizer solution Take 3 mLs (2.5 mg total) by nebulization every 4 (four) hours as needed for wheezing or shortness of breath. 75 mL 1  . ALPRAZolam (XANAX) 0.25 MG tablet Take 0.25 mg by mouth at bedtime.    [name]. aspirin EC 81 MG tablet Take 162 mg by mouth daily as  needed for moderate pain. Swallow whole.    [name]. atorvastatin (LIPITOR) 20 MG tablet Take 20 mg by mouth daily.   0  . AUVI-Q 0.3 MG/0.3ML SOAJ injection Use as directed for life-threatening allergic reaction. 2 each 2  . budesonide (PULMICORT) 0.5 MG/2ML nebulizer solution Take 2 mLs (0.5 mg total) by nebulization as directed. 2 mL 3  . budesonide-formoterol (SYMBICORT) 160-4.5 MCG/ACT inhaler Inhale 2 puffs into the lungs 2 (two) times daily. 1 Inhaler 5  . calcium carbonate (OSCAL) 1500 (600 Ca) MG TABS tablet Take 600 mg of elemental calcium by mouth once a week.    . Continuous Blood Gluc Receiver (DEXCOM G6 RECEIVER) DEVI 1 Device by Does not apply route as directed. 1 each 2  . Continuous Blood Gluc Sensor (DEXCOM G6 SENSOR) MISC SMARTSIG:1 Each Topical Every 10 Days    . Continuous Blood Gluc Sensor (FREESTYLE LIBRE 2 SENSOR) MISC Inject 1 Device into the skin every 14 (fourteen) days. Place prescription on hold. To use if Dexcom CGM fails. 2 each 11  . Glucagon (BAQSIMI TWO PACK) 3 MG/DOSE POWD Place 1 spray into the nose as directed. 2 each 3  . glucose blood test strip Use to check blood sugar up to 10x daily 300 each 11  . HUMALOG 100 UNIT/ML injection Use 300 units in insulin pump every 48 hours 120 mL 1  . insulin lispro (INSULIN LISPRO) 100 UNIT/ML KwikPen Junior Inject up to 50 units daily per provider instructions 15 mL 11  . Insulin Pen Needle (PEN NEEDLES) 32G X 4 MM MISC Use to inject insulin 6x per day 200 each 5  . levocetirizine (XYZAL) 5 MG tablet Take 1 tablet (5 mg total) by mouth daily as needed for allergies. 30 tablet 5  . lipase/protease/amylase (CREON) 36000 UNITS CPEP capsule 3 pills with every meal and 2 pills with every snack 390 capsule 11  . losartan (COZAAR) 50 MG tablet TAKE 1 TABLET(50 MG) BY MOUTH DAILY 90 tablet 1  . Mometasone Furoate (ASMANEX HFA) 200 MCG/ACT AERO For asthma flare, begin Asmanex 200-2 puffs twice a day for 1-2 weeks or until cough and wheeze  free 13 g 5  . montelukast (SINGULAIR) 10 MG tablet TAKE 1 TABLET BY MOUTH EVERY NIGHT AT BEDTIME 30 tablet 5  . Multiple Vitamin (MULTIVITAMIN WITH MINERALS) TABS tablet Take 1 tablet by mouth daily.    [name]. NUCALA 100 MG/ML SOAJ INJECT 100MG  SUBCUTANEOUSLY EVERY 30 DAYS (GIVEN AT MD  OFFICE) 1 mL 11  .  omeprazole (PRILOSEC) 20 MG capsule Take 1 capsule (20 mg total) by mouth daily. 30 capsule 11  . topiramate (TOPAMAX) 50 MG tablet TAKE 1 TABLET(50 MG) BY MOUTH TWICE DAILY 60 tablet 5  . Vitamin D, Ergocalciferol, (DRISDOL) 1.25 MG (50000 UNIT) CAPS capsule Take 50,000 Units by mouth every 7 (seven) days.     Current Facility-Administered Medications on File Prior to Visit  Medication Dose Route Frequency Provider Last Rate Last Admin  . gadopentetate dimeglumine (MAGNEVIST) injection 20 mL  20 mL Intravenous Once PRN Dohmeier, Porfirio Mylar, MD      . Mepolizumab SOLR 100 mg  100 mg Subcutaneous Q28 days Alfonse Spruce, MD   100 mg at 02/16/19 1018    Allergies  Allergen Reactions  . Flonase [Fluticasone] Shortness Of Breath  . Sulfa Antibiotics     Shortness of breath, increased heart rate; "happened quite a while ago"  . Molds & Smuts Other (See Comments)    Aggravate asthma  . Sulfites Itching and Swelling  . Tetracyclines & Related Other (See Comments)    increased intracranial pressure  . Lactose Intolerance (Gi) Other (See Comments)    Upset stomach  . [place] Cough    Family History  Problem Relation Age of Onset  . Dementia Paternal Aunt   . Schizophrenia Paternal Aunt   . Cancer Maternal Grandmother        type unknown  . Diabetes Maternal Grandmother        T2 DM  . Cancer Maternal Grandfather        type unknown  . Irritable bowel syndrome Mother   . Heart disease Father   . Hypertension Father   . Hyperlipidemia Father   . Skin cancer Father   . Thyroid disease Sister   . Stomach cancer Paternal Aunt   . Pancreatic cancer Paternal Aunt   . Diabetes  Maternal Aunt   . Allergic rhinitis Neg Hx   . Angioedema Neg Hx   . Asthma Neg Hx   . Atopy Neg Hx   . Eczema Neg Hx   . Immunodeficiency Neg Hx   . Urticaria Neg Hx   . Esophageal cancer Neg Hx   . Colon cancer Neg Hx   . Colon polyps Neg Hx     BP 110/70 (BP Location: Right Arm, Patient Position: Sitting, Cuff Size: Large)   Pulse 89   Ht 6' (1.829 m)   Wt 208 lb 4.8 oz (94.5 kg)   SpO2 98%   BMI 28.25 kg/m    Review of Systems denies weight loss, sob, n/v.      Objective:   Physical Exam VITAL SIGNS:  See vs page GENERAL: no distress Pulses: dorsalis pedis intact bilat.   MSK: no deformity of the feet CV: no leg edema.   Skin:  no ulcer on the feet.  normal color and temp on the feet.   Neuro: sensation is intact to touch on the feet.    Lab Results  Component Value Date   CREATININE 0.87 01/30/2021   BUN 17 01/30/2021   NA 137 01/30/2021   K 4.0 01/30/2021   CL 107 01/30/2021   CO2 22 01/30/2021     Lab Results  Component Value Date   HGBA1C 6.3 (A) 01/30/2021   ACTH stim: normal  I have reviewed outside records, and summarized: Pt was noted to have elevated A1c, and referred here.  He was dx'ed with pancreatic exocrine insuff 1/22.  Assessment & Plan:  Type 1 DM:  overcontrolled  Patient Instructions  Please continue these settings: basal rate of units/hr, except for 1.1 units/hr, when not in Control IQ bolus of 1 unit/10grams carbohydrate correction bolus (which some people call "sensitivity," or "insulin sensitivity ratio," or just "isr") of 1 unit for each 50 by which your glucose exceeds 100. Please see our CDE, to address sensor problems.   Please come back for a follow-up appointment in 2 months.

## 2021-03-18 NOTE — Patient Instructions (Signed)
Please continue these settings: basal rate of units/hr, except for 1.1 units/hr, when not in Control IQ bolus of 1 unit/10grams carbohydrate correction bolus (which some people call "sensitivity," or "insulin sensitivity ratio," or just "isr") of 1 unit for each 50 by which your glucose exceeds 100. Please see our CDE, to address sensor problems.   Please come back for a follow-up appointment in 2 months.

## 2021-03-19 ENCOUNTER — Encounter (INDEPENDENT_AMBULATORY_CARE_PROVIDER_SITE_OTHER): Payer: Self-pay

## 2021-03-20 ENCOUNTER — Encounter (INDEPENDENT_AMBULATORY_CARE_PROVIDER_SITE_OTHER): Payer: Self-pay

## 2021-03-20 ENCOUNTER — Ambulatory Visit: Payer: 59 | Admitting: Gastroenterology

## 2021-03-20 ENCOUNTER — Telehealth (INDEPENDENT_AMBULATORY_CARE_PROVIDER_SITE_OTHER): Payer: Self-pay | Admitting: Pharmacist

## 2021-03-20 MED ORDER — DEXCOM G6 SENSOR MISC: 11 refills | Status: DC

## 2021-03-20 MED ORDER — DEXCOM G6 TRANSMITTER MISC: 3 refills | Status: DC

## 2021-03-20 NOTE — Addendum Note (Signed)
Addended by: Osa Craver on: 03/20/2021 02:03 PM   Modules accepted: Orders

## 2021-03-20 NOTE — Telephone Encounter (Signed)
  Who's calling (name and relationship to patient) :[name] "[name]" (Self)   Best contact [phone] (H)   Provider they see:Dr. Zachery Conch  Reason for call: Prescription refill for dexcom      PRESCRIPTION REFILL ONLY  Name of prescription:  Pharmacy: [facility]  [address] [facility]

## 2021-03-25 ENCOUNTER — Encounter (INDEPENDENT_AMBULATORY_CARE_PROVIDER_SITE_OTHER): Payer: Self-pay

## 2021-03-25 ENCOUNTER — Other Ambulatory Visit: Payer: Self-pay | Admitting: Allergy & Immunology

## 2021-03-27 ENCOUNTER — Encounter (INDEPENDENT_AMBULATORY_CARE_PROVIDER_SITE_OTHER): Payer: Self-pay

## 2021-03-27 MED ORDER — GLUCOSE BLOOD VI STRP: ORAL_STRIP | 5 refills | Status: DC

## 2021-04-04 ENCOUNTER — Encounter: Payer: 59 | Attending: Family Medicine | Admitting: Dietician

## 2021-04-04 ENCOUNTER — Other Ambulatory Visit: Payer: Self-pay

## 2021-04-04 ENCOUNTER — Encounter: Payer: Self-pay | Admitting: Dietician

## 2021-04-04 DIAGNOSIS — E440 Moderate protein-calorie malnutrition: Secondary | ICD-10-CM

## 2021-04-04 DIAGNOSIS — E108 Type 1 diabetes mellitus with unspecified complications: Secondary | ICD-10-CM

## 2021-04-04 DIAGNOSIS — K8689 Other specified diseases of pancreas: Secondary | ICD-10-CM

## 2021-04-04 NOTE — Progress Notes (Signed)
Medical Nutrition Therapy  Appointment Start time:  1350  Appointment End time:  43 Patient is here today alone.  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  He was last seen by an RD here on 03/13/2021. He is changing his pump sites daily as the sites are going bad fast "welt, clog, and bruise".  He saw our CDCES, Bonita Quin last week to problem solve dexcom issues.  He states he does not have confidence in the Dexcom readings and takes the pump off Control IQ at night and wakes every 2 hours to check his blood sugar due to this.  Referral diagnosis: pancreatic insufficiency, type 1 diabetes Preferred learning style: no preference indicated Learning readiness: ready, change in progress   NUTRITION ASSESSMENT   Anthropometrics  6' 205 lbs (decreased 4 lbs x 2 weeks) 208 lbs 03/12/21 and 03/18/2021 Feels best at 215 lbs UBW 230-240 lbs prior to pancrease issue   Clinical Medical Hx: Type 1 diabetes, pancreatic insufficiency, diabetic retinopathy, neuropathy, gastroparesis  Medications: see list to include Creon, Humalog, vitamin D, MVI Labs: CBG -308 (Admitted to ER for hypoglycemia, 01/30/2021) CO2 - 21 (low)  Amylase - 23 (Low) Pancreatic Elastase - 76 (Low) Notable Signs/Symptoms: weight loss Nutrition Focused Physical Exam performed to obtain baseline.  Patient with decreased muscle at scapula/decreased strength.  Loss of fat on upper arm. Nails/eyes WNL.  T-Slim insulin pump:  basal rate 1.1 units per hour when not in Control IQ and bolus of 1 units /er 10 rams of carbohydrates.  Correction bolus is 1 unit per 50 points above 100. Dexcom CGM He is hypoglycemia unaware until glucose of 50.  He sees Dr Fransico Michael and started seeing Dr. Everardo All to have an adult endocrinologist option in the event that Dr. Fransico Michael is not available.     Lifestyle & Dietary Hx Patient lives with his parents due to his  inability to walk after hospitalization in April.  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 He states that his parents do not know the basics about diabetes or emergency situations related to diabetes.  He would like for them to have more information. No alcohol in the past 5 years.  Estimated daily fluid intake:  oz Supplements: MVI, vitamin D,  Sleep: has not slept through the night in the past 6 months due to diabetes monitoring/alarms Stress / self-care: due to chronic and acute illness.  Sees [facility], counselor weekly.  Fear of food due to hyperglycemia Current average weekly physical activity: He started PT this week and is to go 1 hour twice per week.    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" First Meal: 2 eggs, 1 slice rye toast, [place] sausage or other meat, mustard, occasional apples Snack: none Second Meal: chicken or [place] sandwich and more eggs Snack: occasional quest chips or protein donut Third Meal: similar to lunch OR salad with rye bread, boiled egg or chicken sausage or cut chicken, caesar dressing OR eggs, sausage OR salmon, rye bread, salad, eggs Snack: occasional quest chips Beverages: water  Estimated Energy Needs Calories: 2200 Carbohydrate: 192g Protein: 115-130g   NUTRITION DIAGNOSIS  NB-1.1 Food and nutrition-related knowledge deficit As related to adequate nutrition related to health issues .  As evidenced by diet hx and patient report.   NUTRITION INTERVENTION  Nutrition education (E-1) on the following  topics:  Need for variety and increase quantity with meals Recipe options Add snacks for increased calories Monounsaturated fat and MCT oil and how to incorporate Options for supplements Medication/dexcom/pump issue review Importance of physical activity for muscle strength improvement Added patient to Type 1 Diabetes Support Group (patient  would like more support for not only himself but also more education for his parents).  Handouts Provided Include  Meal plan card Hypoglycemia symptoms and treatments Diabetes resources  Learning Style & Readiness for Change Teaching method utilized: Visual & Auditory  Demonstrated degree of understanding via: Teach Back  Barriers to learning/adherence to lifestyle change: stress/fear  Goals Established by Pt [email or web address] Do basic meal planning to help get variety in your diet Be sure that you are adding healthy fats with each meal  Olive oil, avocados, nuts as tolerated Consider Fairlife milk Consider MCT oil Continue the Creon as prescribed  Continue Physical therapy Eat when hungry in addition to meals   MONITORING & EVALUATION Dietary intake, weekly physical activity in 1 month.  Next Steps  Patient is to call for questions.

## 2021-04-04 NOTE — Patient Instructions (Addendum)
[email or web address] Do basic meal planning to help get variety in your diet Be sure that you are adding healthy fats with each meal  Olive oil, avocados, nuts as tolerated Consider Fairlife milk Consider MCT oil Continue the Creon as prescribed  Continue Physical therapy Eat when hungry in addition to meals

## 2021-04-05 NOTE — Progress Notes (Addendum)
S:     Chief Complaint  Patient presents with   Diabetes    Medication Management and Pump Education     Endocrinology provider: Dr. Fransico Michael (upcoming appt 04/09/21 3:45 pm)  Patient referred to me by Dr. Fransico Michael for insulin pump initiation and training. 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 Patient wears a t:slim X2 insulin pump and Dexcom G6 CGM. Patient was started on t:slim X2 insulin pump on 02/07/21. Patient has been followed for pump adjustments / pump education since initiation of t:slim X2 insulin pump.  Patient recently was seen by Dr. Fransico Michael on 02/27/21. His avg BG was 185 (BG range 145-216) and TIR was ~73%. Patient admitted a few hypoglycemic episodes. Most noticeable pattern was increase in BG readings as patient has been increasing carbohydrate intake. Dr. Fransico Michael continued all insulin pump settings.   Patient presents today for follow up appt. Patient states that his Creon dose was recently increased (now taking 3 capsules with meals, 2 capsules with snacks). He spoke with a Dexcom rep who advised him to calibrate Dexcom 3x every 15 minutes. He has written down all of his BG readings; there were four readings that were off >20%. He feels that his Dexcom has frequently inaccurate. He states twice there have been instances that Dexcom reads 140 however actual fingerstick will be 70.   Insurance: Development worker, international aid (Max: 3.0) 12AM 1.1                           Total: 26.4 units   Insulin to carbohydrate ratio (ICR)  12AM 10  11AM  8                    Max Bolus: 25   Insulin Sensitivity Factor (ISF) 12AM 50                             Target BG 12AM 110                              Pump Serial Number: [id number]   Infusion Set: Trusteel 6 mm   Infusion Set Sites -Patient reports infusion sites are thighs, lower back  --Patient reports independently doing infusion set site changes --Patient reports rotating infusion set sites Getting TruSteel and Autosoft sites as of right now  Diet  (slight changes since prior appt on 03/12/21) Patient reported dietary habits:  -Breakfast (10-11am; took 3 capsule of Creon): 2 eggs, sausage link, [place], 1 slice of bread (15-16 g of carb), mustard -Lunch (~2pm): sandwich (~30 grams of carb) , mixed vegetables -Snack (sometimes, 2 capsule of Creon): nuts, [place], broccoli, fig bar -Dinner (~7:30 pm; 3 capsules of Creon): eggs, chicken, broccoli, baby carrots, 1 slice of bread (15-16 g of carb) -Snack (~12-1am, always snacks at this dinner; 2 capsule of Creon): quest protein chips, nuts, [place], fig bar, pepperoni sticks -Fast food: none  -Sweets/deserts: none -Pasta/rice: none -Fruit: 1 or 2 slices of apple, blueberries -Cereal: none  -Chips/crackers: quest protein chips -Milk: none (lactose intolerant) -Soda/juice: none Now consuming more olive oil, avocado Has not tried lactose free milk - planning on doing so  tomorrow  Exercise (slight changes since prior appt on 03/12/21) Patient-reported exercise habits:  PT ([name]) 1-2x/week about 1 hours each time   Monitoring: Patient reports 1 episode of nocturia (nighttime urination) each night Patient reports occasional neuropathy (nerve pain). Patient reports visual changes (blurry vision since eye surgery). (Followed by ophthalmology; last seen by [name] at [facility] on 02/19/21) -Per patient, [name], stated there was "some fluid" found and retinopathy may have worsened. [name] advised him to follow up with retinal specialist -Has followed up with retinal specialist. Per patient there is a pocket of fluid near the retina and the retinal  specialist will be monitoring this.  Patient reports self foot exams; no open cuts/wounds   O:   Labs:   Dexcom G6 CGM Report     Tconnect Report  Patient continues to show a pattern of underbolusing at meals (typically boluses ~5g of carb per meal; most he bolused for was ~17). He enters 1-3 "ghost carbs" commonly to adjust insulin independently.  Vitals:   04/09/21 1011  BP: 104/82  Pulse: 84     Lab Results  Component Value Date   HGBA1C 6.3 (A) 01/30/2021   HGBA1C 7.5 (A) 12/05/2020   HGBA1C 6.2 (A) 08/16/2020    Lab Results  Component Value Date   CPEPTIDE <0.1 (L) 01/30/2021       Component Value Date/Time   CHOL 197 01/24/2021 0853   TRIG 111 01/24/2021 0853   HDL 40 01/24/2021 0853   CHOLHDL 4.9 01/24/2021 0853   VLDL 19 12/29/2016 1146   LDLCALC 135 (H) 01/24/2021 0853    Lab Results  Component Value Date   MICRALBCREAT 2 05/14/2020    Assessment: DM Management / education- TIR extremely close to goal > 70%. No hypoglycemia. Patient is extremely anxious regarding Dexcom G6 CGM readings being inaccurate. Reviewed BG reading he provided; all Dexcom vs manual glucometer readings were within 20% as BG was > 80 mg/dL (per FDA guidance) EXCEPT for 5 BG readings. I am concerned that he reports there were 2 episodes of nocturnal hypoglycemia per manual BG meter (BG ~70), however, [name] reports that Dexcom  reading was ~150. There is no evidence of these readings in his Dexcom report and there was no manual Bgreport download to review this. Discussed with patient how nocturnal hypoglycemia is concerning. [name] is adamant about wanting to wear CGM, however, is extremely hesitant/anxious about using Control IQ algorithm with Dexcom readings when Dexcom. Compromised with patient. He will wear Dexcom G6 and FSL 2.0 CGM (provided 2 samples of FSL 2.0 CGM sensors). He will enter manual mode with pump. He will monitor BG readings prior to meals. He will keep a BG monitor  log; write down date / time / Dexcom BG reading & arrow / Freestyle Libre 2.0 BG reading & arrow / manual glucometer BG reading. Write down any notes. I am curious to see if his exocrine pancreas disorder is the underlying cause of inaccurate dexcom readings considering his body cannot appropriately process carbohydrates which may impact CGM ability to appropriately determine BG readings. Will ensure this data also gets to a Dexcom representative. Patients with exocrine pancreas disorders typically were not included in Dexcom clinical trials. Will also advise patient to keep a thorough food diary (date/ time/ food items/ carb per food items / notes) to hopefully overcome how he enters ghost carbs. Follow up in 1 month.   Plan: Continue insulin pump settings  Education -  Keep a  BG monitor log; write down date / time / Dexcom BG reading & arrow / Freestyle Libre 2.0 BG reading & arrow / manual glucometer BG reading. Write down any notes. Food diary - write down date / time / food items / carb per food item / notes. Make a note if you administer a lower amount of carbs than you have eaten.   Monitoring:  Continue wearing Dexcom G6 CGM [name] has a diagnosis of diabetes, checks blood glucose readings > 4x per day, wears an insulin pump, and requires frequent adjustments to insulin regimen. This patient will be seen every six months, minimally, to assess adherence to their CGM regimen and diabetes treatment plan. Follow Up: 1 month  This appointment required 60 minutes of patient care (this includes precharting, chart review, review of results, face-to-face care, etc.).  Thank you for involving clinical pharmacist/diabetes educator to assist in providing this patient's care.  Zachery Conch, PharmD, CPP, CDCES  I have reviewed the following documentation and am in agreeance with the plan. I was immediately available to the clinical pharmacist for questions and collaboration. Molli Knock, MD, CDE

## 2021-04-08 ENCOUNTER — Other Ambulatory Visit: Payer: Self-pay

## 2021-04-08 ENCOUNTER — Encounter: Payer: Self-pay | Admitting: Family Medicine

## 2021-04-08 ENCOUNTER — Ambulatory Visit: Payer: 59 | Admitting: Family Medicine

## 2021-04-08 VITALS — BP 100/64 | Ht 72.0 in | Wt 205.0 lb

## 2021-04-08 DIAGNOSIS — M791 Myalgia, unspecified site: Secondary | ICD-10-CM | POA: Diagnosis not present

## 2021-04-08 NOTE — Patient Instructions (Signed)
Back the physical therapy down to once a week but ask about aquatic therapy - this may be a better option for you to help unload the joints while you work on regaining some strength. If you want to start something or see someone for the depressive symptoms as a result of everything that's been going on, let me know as well. Keep follow-ups as you have with endocrinology, the CDE, and nutrition. Follow up with me in about 6 weeks.

## 2021-04-08 NOTE — Progress Notes (Signed)
[name] - 40 y.o. male MRN [id number]  Date of birth: 1981-07-18  SUBJECTIVE:    CC: Joint Pain  3/9: Patient reports over the past 2 months he has felt increasingly weak. Primary complaint today is feeling like his feet and hands are 'down to the bone' and it feels uncomfortable when holding the steering wheel, walking. Feels at times like he cannot hold his body weight. Of note he's had difficulty with his insulin pump in getting sites to work (better past 5 days). Relatedly he's lost about 10+ pounds over the past 1-2 months. Reports large caloric deficit taking in 800-900 calories a day over past couple months. Denies numbness or tingling in feet or hands. Microfilament testing has been normal per endo notes. He is also scheduled to see neurologist tomorrow. Has regular appointments with diabetes educator but not dietitian. Denies nausea or vomiting though appetite is not what it has been. Autonomic dysfunction noted in chart - asked him about gastroparesis and he reports history of this being mentioned remotely.   3/23: Patient reports he feels like he's worse than last visit. Biggest problem is recent stomach virus with nausea, diarrhea, abdominal pain - feels this has improved but was present for several days. Blood sugars not increasing with his carbohydrate intake - even had to turn off his basal rate because blood sugar was dropping. This morning it appears to be rising in response to diet, had no ketones this morning and basal is now back on. Stomach feels crampy. Has not seen GI in past. Started BRAT diet as recommended by Dr. Fransico Michael. Had initial visit to dietitian and doesn't follow up for about 3-4 more weeks - most discussion was general in the initial visit.   4/6: [name] comes in today very fatigued, run down. He reports he is not even running his insulin pump 90% of the time and maximum bolus he's given himself over more than a week was 1 unit of  insulin. Blood sugars still not responding to his dietary intake. He's consuming more than 2000 calories a day and has been tracking this regularly on an app. Eating variety of things but primarily eggs, sausage, bread, applesauce throughout the day. He is not sleeping because of concern about his blood sugars dropping overnight and not being surrounded by people who know how to take care of this if it were to happen. Takes more than an hour for blood sugar to stabilize when it starts to drop despite drinking sugar-containing beverages. He had EGD with Dr. Christella Hartigan after evaluation - some gastritis and biopsies sent to assess for celiac but otherwise normal test. Gastric emptying study was normal in 2019. Has a pancreatic MRI scheduled for Saturday. Reports he's not sure how long he can continue like this though denied suicidal ideation when asked directly and no plan.   5/9: Patient was admitted to the hospital after last visit and had extensive workup. Found to have exocrine pancreatic insufficiency. Some concern his frequent monitoring is causing increased distress leading to decreased sleep and anxiety. They had a discussion regarding switch from pump to basal + bolus but patient would like to continue with pump - we separately discussed this today as well. Other labwork including for celiac, thyroid dysfunction, adrenal insufficiency normal. No insulinoma on imaging. He's interested in a more specific dietary plan than he's received to date - Dr. Christella Hartigan looked into this for him and so far no luck. He continues to have site issues having to change  daily. Tried walking for exercise once - 30 minutes on treadmill and this caused quite a bit of joint pain subsequent days.  5/22: He has lost 2 pounds since last visit.  Started PT which has made him more fatigued. Reports not helping much yet.  Knee pain is bothering him the most recently. Feels like bone on bone because he has no muscle tone  to help support joints.  He is still having insulin pump and sensor problems.  He has to check his sugars every hour and dose as needed. He is even waking up hourly overnight.  Talked to a dietitian last week. His mood is down still. He does take some alprazolam at night.   Objective:  VS: BP:100/64  HR: bpm  TEMP: ( )  RESP:   HT:6' (182.9 cm)   WT:205 lb (93 kg)  BMI:27.8 PHYSICAL EXAM:  General: appears fatigued. In NAD. A&Ox3.  ASSESSMENT & PLAN:  1. Hypoglycemia, Type 1 DM - continued weight loss. Discussed moving PT to once a week to lessen the fatigue he has from it. We have also advised him to talk to his PT about starting aquatic therapy as we think that will be more beneficial for him at this time and take pressure off his joints while building strength back. Discussed SSRI for depressive symptoms but he does not want to take one at this time. We will have him continue to talk to his endocrinologist and [employer] about the pump issues. We discussed spacing out overnight blood sugar checks to help him get more sleep. He will follow up with [place] in 6 weeks.   Aurelio Jew, MS4

## 2021-04-09 ENCOUNTER — Encounter (INDEPENDENT_AMBULATORY_CARE_PROVIDER_SITE_OTHER): Payer: Self-pay | Admitting: Pharmacist

## 2021-04-09 ENCOUNTER — Encounter (INDEPENDENT_AMBULATORY_CARE_PROVIDER_SITE_OTHER): Payer: Self-pay | Admitting: "Endocrinology

## 2021-04-09 ENCOUNTER — Ambulatory Visit (INDEPENDENT_AMBULATORY_CARE_PROVIDER_SITE_OTHER): Payer: 59 | Admitting: "Endocrinology

## 2021-04-09 ENCOUNTER — Ambulatory Visit (INDEPENDENT_AMBULATORY_CARE_PROVIDER_SITE_OTHER): Payer: 59 | Admitting: Pharmacist

## 2021-04-09 VITALS — BP 104/82 | HR 84 | Wt 207.0 lb

## 2021-04-09 VITALS — BP 122/74 | HR 78 | Wt 207.0 lb

## 2021-04-09 DIAGNOSIS — E10649 Type 1 diabetes mellitus with hypoglycemia without coma: Secondary | ICD-10-CM

## 2021-04-09 DIAGNOSIS — K8681 Exocrine pancreatic insufficiency: Secondary | ICD-10-CM | POA: Diagnosis not present

## 2021-04-09 DIAGNOSIS — E1065 Type 1 diabetes mellitus with hyperglycemia: Secondary | ICD-10-CM

## 2021-04-09 DIAGNOSIS — I10 Essential (primary) hypertension: Secondary | ICD-10-CM

## 2021-04-09 LAB — POCT GLUCOSE (DEVICE FOR HOME USE): POC Glucose: 209 mg/dl — AB (ref 70–99)

## 2021-04-09 NOTE — Patient Instructions (Signed)
Follow up at 12:30 PM on 04/15/21.

## 2021-04-09 NOTE — Progress Notes (Signed)
Subjective:  Patient Name: [name] Date of Birth: 1981-01-12  MRN: [id number]  [name]  presents at his clinic visit today for follow-up of his type 1 diabetes mellitus, goiter, obesity, combined hyperlipidemia, adult ADHD, fatigue, autonomic neuropathy, tachycardia, hypertension, microalbuminuria, hypoglycemia, sinusitis, bronchitis, allergies, proliferative retinopathy, pseudotumor cerebri/increased intracranial hypertension, and unintentional weight loss.[name]   HISTORY OF PRESENT ILLNESS:   [name] is a 40 y.o. Caucasian gentleman. [name] was unaccompanied.  1. The patient was first referred to me on 01/08/2006 by his family physician, Dr. Andreas Blower, for evaluation and management of type 1 diabetes mellitus and related problems. The patient was 40 years old.  A. [name] had been diagnosed with type 1 diabetes somewhere in 2001-2002, at the age of 37-19. He was initially diagnosed with type 2 diabetes mellitus, but was later re-classified as type 1 diabetes mellitus. He had been followed by Dr. Romilda Garret, staff endocrinologist at [facility]. [name] was started on an insulin pump approximately 18 months prior to first seeing me. His pump was a Medtronic Paradigm 712. His blood glucose control was fair-to-poor. He was frequently not checking blood sugars or taking insulin boluses as often as he needed to. He frequently noted fast heart rate. The patient's past medical history was positive for hypertension, for which he was taking lisinopril, and for what his mother called "extreme ADHD". He had previously been taking ADHD medicines but had discontinued them due to adverse effects. He had prior ankle injuries and knee injuries, to include tears of the lateral menisci bilaterally. He had not had any surgeries. 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 As a result, the patient was on the road a lot and spent  much of his time outdoors at field sites. He did not use tobacco or drugs, but did drink alcohol occasionally.  B. On physical examination, his weight was 234 pounds, his height was 70 inches, his BMI was 33.2. Blood pressure was 128/70. Hemoglobin A1c was 9.5%. Heart rate was 96. He had normal affect and fair insight. He had a 25-30 gram goiter. He had normal 1+ DP pulses in his feet. He had normal sensation in his feet to touch, vibration, and monofilament. His CMP was normal except for glucose of 251. His cholesterol was 257, triglycerides 143, HDL 49, and LDL 179. TSH was 1.017, free T4 was 1.06, and free T3 was 3.3. His urinary microalbumin: creatinine ratio was 20.1 (normal less than 30).   C. The patient clearly needed better blood glucose control, which would only occur if he had better adherence to his plan. He appeared to have autonomic neuropathy, manifested by inappropriate sinus tachycardia.  His thyroid goiter suggested that he might have evolving Hashimoto's disease. He stated that he had lost some weight recently. I encouraged him to check his blood sugars more frequently and to take both correction boluses and food boluses.  2. During the past fifteen years, [name] has had to deal with many different problems:  A. T1DM/hypoglycemia:    1). 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 His hemoglobin A1c values had varied from 8.3-11.2%.   2). After starting him on a Medtronic 670G insulin pump and Guardian 3 sensor on 01/12/17, his BG control has improved significantly, despite the illnesses  described below.   B. DM complications: .   1). He has bilateral diabetic retinopathy and is followed by his retinologist, Dr. Deloria Lair, MD.     A). He saw Dr. Zadie Rhine in October 2020 and again in February 2021. There were no new signs of diabetic  eye disease.     2). At [facility] visit with me in July 2021 he asked me to arrange a second opinion retinal consultation for him. I arranged for him to see Dr. Dennard Nip in the [place].    2). He has autonomic neuropathy manifested by inappropriate sinus tachycardia and gastroparesis causing postprandial bloating and GERD. These problems have varied inversely with his degree of BG control.   C. Combined hyperlipidemia:     1). On 12/03/10 his total cholesterol was 270, triglycerides 94, HDL 50, and LDL 201. I started him on Crestor, 10 mg per day. He has since begun treatment with atorvastatin and his cholesterol values had improved.    2). On 12/29/16 his lipids had improved, but his cholesterol values were still elevated. After developing the illnesses noted below, his atorvastatin was discontinued due to concerns that he might be having adverse reactions to the atorvastatin.    D. Allergies, sinusitis, bronchitis, headaches, muscle pains/spasms:   1). Unfortunately, beginning in about June 2018, [name] began to develop recurrent episodes of allergic rhinitis, head fullness, sinusitis, and bronchitis. He also developed headaches, muscle pains and spasms, lethargy, fatigue, and weakness that were presumably due in large part to severe allergies. His atorvastatin and several other medications were stopped at that time. Despite receiving excellent care from his allergist, to include allergy injections and biologic injections, [name]'s allergic issues persisted.    2). He saw Dr. Carmelina Peal on 03/15/19 and again in February 2021. [name]'s allergies and asthma have been okay since has been wearing his covid mask. He has not needed steroids recently. His second sleep study was not bad enough to start on C-pap. In the past 8-10 months these illnesses/problems have largely resolved, in part due to taking a monthly biologic allergy injection.   E. Increased intracranial pressure:    1). [name] was seen in consultation by Dr.  Larey Seat, neurology, on 05/28/17 for headaches, tremors of his right hand and foot, shaky vision, and symptoms c/w OSA. His initial sleep study did not show any significant amount of OSA.    2). On 03/08/18, Dr.Dohmeier was notified that [name] eye doctor had seen signs of fluid build up in his right eye. An LP was performed in radiology on 03/09/18. Opening pressure was 28 cm of water, c/w increased intracranial pressure and pseudotumor cerebri. This problem was initially though to be due to tetracycline usage, so TCN was discontinued and Diamox begun. The Diamox doses have been changed over time.    3). On 01/03/19 he had an LP that showed elevated pressure. His diamox dose was increased somewhat. Dr. Brett Fairy also started him on Topamax. He had a follow up visit with Dr. Brett Fairy on 02/23/20. She continued him on low doses of both medications. He still has some adverse effects of the Diamox, but not as bad. He still has some mild fatigue.  G. Adult ADD/depression: [name] was evaluated by Dr Carlena Hurl, a noted neuropsychiatrist in [place] in 2018. At his last follow up visit in November 2018, Dr. Wylene Simmer decided to wait until [name]'s overall health situation  stabilized before trying any new medications.   H. General physical and emotional health: [name]  has been very sick for much of the past 3 years. He has been unable to work full-time and has become almost confined to his home. He has become increasingly anhedonic over time.   I. Neck pains, decreased cervical range of motion, and shoulder girdle pains:   1). Several months ago he began to develop pains in his left upper chest and shoulder area. He saw Dr. Oneida Alar. The pains then went into his right upper chest and shoulder, then into his shoulder blades. Now he can't bend his neck. [place] of his left shoulder was negative.    2). He missed his follow up appointment with Dr. Oneida Alar due to other conflicting medical appointments. I asked him to  re-schedule that appointment.  J. He had an MRI and an MRA of his brain. The MRI of the brain and brainstem down to C3 was normal. The MRA showed the following:  Moderate stenosis of the M1 segment of the left middle cerebral artery.  Negligible flow through the A1 segment of the left anterior cerebral artery.  This could be due to a hypoplastic/aplastic segment, a normal variant.  This could also be due to severe stenosis.  The left A2 segment derived this flow from the right to through the anterior communicating artery a "moderate stenosis of the M1 segment of the left middle cerebral artery: He then had an angiography procedure to evaluate his bilateral carotid  And innominate arterial circulation. That study showed: Approximately 65-70% stenosis of the left internal carotid artery supraclinoid segment secondary to a mildly irregular intracranial arteriosclerotic plaque. No opacification of the left anterior cerebral A1 segment most likely represents a pathologic intracranial occlusion. 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 Approximately 10 mm x 8.3 mm pyramidal configured high-riding jugular bulb. [name] was told to" take 325 mg of ASA daily as a secondary stroke prevention strategy".  K. Following the angiography procedure he developed bilateral palmar hand numbness. Fortunately, these symptoms are improving   L. He has an asthma consultation with Ms Juliann Pares, [place], on 07/24/20. He had mild restrictive disease, essentially unchanged.  M. He had an evaluation by Dr. Celine Ahr, [facility], on 06/19/20 and a follow up visit on 10.01/21. The cardiologist recommended continuing his  ASA and statin therapy, but did not feel any cardiology follow up was needed.   Doristine Counter is followed by [facility] for Idiopathic Intracranial Hypertension (IIH). He  saw his neurologist at [facility] on 10/15/20. MRA of the brain did not add any additional info. [name] had a follow up visit on 01/03/21.  O. On 01/30/21, I saw him as an urgent add-on because Dr. Karlton Lemon saw [name] in the [facility] that day and thought that [name] looked bad and needed to be admitted. When I saw [name] I agreed. I contacted the Internal Medicine Teaching Service and had [place] admitted for evaluation and management.    1). During that admission from 01/30/21-02/02/21, [name] was thoroughly evaluated. TSH was 1.23, free T4 1.1, free T3 3.6; 25-OH vitamin D was 51.    2). To rule out autoimmune adrenal insufficiency (Addison's disease), two sets of ACTH and cortisol values were performed. On 01/30/21 at 1:56 PM, his ACTH was 23 (ref 6-50 in the AM) and his cortisol was 16.5 (ref 4-22 at 8 AM, 3-17 at 4 PM). On 01/31/21 at  7:24 AM his ACTH was 29.9 (ref 7.2-63.3 in the AM) and his cortisol was 6.5 (ref 6.7-22.6 in the AM). Although the ACTH values were well within normal limits, which would tend to exclude adrenal cortical insufficiency, he had one normal cortisol and one low cortisol. To further evaluate his hypothalamic-pituitary-adrenal (HPA) axis, an ACTH stimulation test was performed on 02/01/21 at 8:21 AM. Baseline cortisol was normal at 8.9. +30 minute cortisol was normal at 21.6. +60 minute cortisol was normal at 24.6. This very brisk and normal response ruled out adrenal insufficiency.    3). To rule out celiac disease, a tissue transglutaminase IgA and IgA were drawn. The tTG IgA was normal at <1.0. The IgA was normal at 136 (ref 47-310). Dr. Ardis Hughs also performed an endoscopy on 01/28/21 and took biopsies of the small stomach and small intestine. The gastric biopsies were negative for H. Pylori and the small intestinal biopsies were negative for celiac disease.                   4). To rule out an extrapancreatic insulinoma, a C-peptide was obtained on 01/30/21. The value was very low at <0.1  (ref 1.1-4.4), c/w his autoimmune T1DM.              5). To rule out a pancreatic tumor, an abdominal MRI was performed with and without contrast on 01/31/21. The pancreas was "moderately thin/atrophic", similar to a CT scan performed on 03/10/18. "There was no abnormal pancreatic parenchymal enhancement.   6). To rule out exocrine pancreas insufficiency, three tests were performed on 01/31/21.  The serum lipase was normal at 22 (ref 11-51). The serum amylase was low at 23 (ref 28-100). The stool pancreatic elastase was markedly low at 76 (ref >200). The low serum amylase and the very low stool elastase confirmed the clinical suspicion of pancreatic exocrine deficiency. Unfortunately, we do not know the cause of this exocrine insufficiency.    7). [name] was started on Creon and discharged on 02/02/21. His initial Creon dose was one 36,000 unit capsule by mouth with meals.   P. On 02/12/21 he had a CT/MRA scan of his head and neck. The brain was normal. He had mild soft plaque at the ICA bulb. No stenosis was seen. He had a 50% stenosis of the left supraclinoid ICA. There was no other intracranial large of medium vessel occlusion of correctable proximal stenosis. No patent A1 segment was seen on the left. Both anterior cerebral arteries receive their blood supply from the right carotid circulation.   Q. He met with Dr. Ardis Hughs in GI on 02/26/2021. Dr. Ardis Hughs increased the Creon doses to 3 with meals and 2 with snacks. Dr. Ardis Hughs referred [name] to [facility] for assistance in dietary management of his pancreatic insufficiency, .   3. [name]'s last [facility] visit was on 03/12/2021. At that visit we continues his insulin pump settings.   A. In the interim, [name] had been feeling "not too well". He has been having a lot of trouble with his Dexcom not working. He remains tired. He is not feeling weaker or stronger. He thinks that he has been eating a lot. BGs have been running higher. He is still having site  problems. Sites can last up to two days if he uses his backside.    1). He saw Dr. Loanne Drilling on 03/18/21.  2). He saw Dr. Drexel Iha, PharmD on 03/12/21 and again on 04/09/21. 3). He saw Ms. Antonieta Iba,  RD, on 04/04/21. 4). He is very frustrated. He feels that his Dexcom is not tracking, in that the Dexcom does not identify the large swings that he has in CBG. He does not trust the Dexcom, so does not trust the interface with the T-Slim pump. He frequently shuts off the control IQ function, especially at night. He then sets his alarm to wake up every two hours to check his BG and either take glucose or give insulin boluses. As a result, he is not getting enough sleep.  5). Dr. Lovena Le gave him a Elenor Legato to try in comparison to the Dexcom  6). He is able to eat more now without discomfort. He is now re-gaining weight. He now takes three Creon capsules with each meal and 1-2 capsules for snacks. He is not having much abdominal bloating. He is not having much diarrhea. He does not usually have abdominal cramps, but fewer.  7). His joints still hurt "a lot'.   B.  On 04/08/21 [name] had another follow up visit with Dr. Barbaraann Barthel in Sports Medicine for evaluation and management of muscle pain. Dr. Barbaraann Barthel again recommended aquatic therapy and a gradually and progressively increasing walking plan to help [name] regain his strength. [name] has been going to PT.    C. On 03/04/21 [name] also had a visit with Dr. Zadie Rhine in Ophthalmology. [name] had some microaneurysms, but no clinically significant macular edema. His PDR was inactive. He will see Dr. Zadie Rhine again on 07/08/21.  DChester [name] had a second Moderna covid booster immunization on 03/11/21.  E. [name] was previously found to have vitamin D level of 10. He is now taking 50,000 IU/week. He also takes a MVI daily.   F. He continues to see his therapist, Ms. [facility], every week. He is using alprazolam, 0.25 mg at bedtime when needed for anxiety. His PCP will prescribe the medication  for him.  He thinks the medication and the therapy are helping him.   G. His no longer has nausea. He stopped taking omeprazole.   H. He continues to have problems with lactose intolerance. When he avoids lactose, however, he is asymptomatic. Some foods such as shellfish cause his throat to swell up.   I. He takes losartan, 50 mg/day. He rarely has any coughing unless his allergies act up. He also takes atorvastatin, 20 mg/day.    J. His insurance plan will not allow him to have any more than 200 BG test strips per month.   K. He will see the neuroophthalmologist on 6/23.22.   L . He frequently turns off his control IQ to avoid hypoglycemia.  M.  He has a little more stamina.   4. Pertinent Review of Systems: Constitutional: [name] feels "tired and sore all over". His sleep quality is "not too great due to getting up every two hours to check his BGs and adjust his pump to avoid hypoglycemia. He has both insomnia and early awakening.  Eyes: As above. He still has the "sparkles" in his eyes essentially all the time. The visual color changes, muscle spasms, and tingling that were attributed to Diamox still bother him, but less since reducing the Diamox dose. Neck: He has not had any recent anterior neck swelling and tenderness.  Lungs: Breathing has been "good", but he still sometimes has SOB with exertion.  Heart: He thinks his heart beats faster when he lays down. about the same. Gastrointestinal: As above. Bowel movements have been better. Hands: All of his joints hurt.  Legs: As above.  Muscle mass and strength seem about the same, not worse and not better. There are no other complaints of numbness, tingling, burning, or pain. No edema is noted. Feet: There are more pains in his soles. There are no complaints of numbness, or burning.  No edema is noted. Hypoglycemia: He has had some low BG alerts.  Emotional: "I'm very worn out, probably a little worse.)"    5. Pump printout: He changes sites  every 1-3 days, usually every day. Average BG is 167, range 150-191. He does not know how to input his FSBGs into the pump.   6. CGM printout: He has been in auto mode 61%, compared with 99% at his last visit. Average SG is 172, compared with 161 at his last visit and with 159 at his last prior. SG range is 114-270. Time in Range is 69%, compared with 76% at the last visit and with 73% at his prior visit. Time above range is 35%, compared with 24% at his last visit.   PAST MEDICAL, FAMILY, AND SOCIAL HISTORY:  Past Medical History:  Diagnosis Date   ADHD (attention deficit hyperactivity disorder)    Anxiety    Asthma    Autonomic neuropathy due to diabetes (HCC)    Chronic headaches    Diabetes (HCC)    Fatigue    GERD (gastroesophageal reflux disease)    Goiter    High blood pressure    High cholesterol    Hypercholesterolemia    Hypertension    Hypoglycemia associated with diabetes (HCC)    Malnutrition (HCC)    Obesity    Sleep apnea    mild, no cpap   Tachycardia    Type 1 diabetes mellitus not at goal [facility])    Uncontrolled DM with microalbuminuria or microproteinuria     Family History  Problem Relation Age of Onset   Dementia Paternal Aunt    Schizophrenia Paternal Aunt    Cancer Maternal Grandmother        type unknown   Diabetes Maternal Grandmother        T2 DM   Cancer Maternal Grandfather        type unknown   Irritable bowel syndrome Mother    Heart disease Father    Hypertension Father    Hyperlipidemia Father    Skin cancer Father    Thyroid disease Sister    Stomach cancer Paternal Aunt    Pancreatic cancer Paternal Aunt    Diabetes Maternal Aunt    Allergic rhinitis Neg Hx    Angioedema Neg Hx    Asthma Neg Hx    Atopy Neg Hx    Eczema Neg Hx    Immunodeficiency Neg Hx    Urticaria Neg Hx    Esophageal cancer Neg Hx    Colon cancer Neg Hx    Colon polyps Neg Hx      Current Outpatient Medications:    acetaZOLAMIDE (DIAMOX) 250 MG tablet,  Take 250 mg by mouth 2 (two) times daily., Disp: , Rfl:    ALPRAZolam (XANAX) 0.25 MG tablet, Take 0.25 mg by mouth at bedtime., Disp: , Rfl:    aspirin EC 81 MG tablet, Take 162 mg by mouth daily as needed for moderate pain. Swallow whole., Disp: , Rfl:    atorvastatin (LIPITOR) 20 MG tablet, Take 20 mg by mouth daily. , Disp: , Rfl: 0   AUVI-Q 0.3 MG/0.3ML SOAJ injection, Use as directed for life-threatening allergic reaction., Disp:  2 each, Rfl: 2   budesonide (PULMICORT) 0.5 MG/2ML nebulizer solution, Take 2 mLs (0.5 mg total) by nebulization as directed., Disp: 2 mL, Rfl: 3   budesonide-formoterol (SYMBICORT) 160-4.5 MCG/ACT inhaler, Inhale 2 puffs into the lungs 2 (two) times daily., Disp: 1 Inhaler, Rfl: 5   calcium carbonate (OSCAL) 1500 (600 Ca) MG TABS tablet, Take 600 mg of elemental calcium by mouth once a week., Disp: , Rfl:    Continuous Blood Gluc Receiver (DEXCOM G6 RECEIVER) DEVI, 1 Device by Does not apply route as directed., Disp: 1 each, Rfl: 2   Continuous Blood Gluc Sensor (DEXCOM G6 SENSOR) MISC, Change sensor every 10 days, Disp: 3 each, Rfl: 11   Continuous Blood Gluc Sensor (FREESTYLE LIBRE 2 SENSOR) MISC, Inject 1 Device into the skin every 14 (fourteen) days. Place prescription on hold. To use if Dexcom CGM fails., Disp: 2 each, Rfl: 11   Continuous Blood Gluc Transmit (DEXCOM G6 TRANSMITTER) MISC, Change every 90 days, Disp: 1 each, Rfl: 3   Glucagon (BAQSIMI TWO PACK) 3 MG/DOSE POWD, Place 1 spray into the nose as directed., Disp: 2 each, Rfl: 3   glucose blood test strip, Use to check blood sugar up to 10x daily, Disp: 300 each, Rfl: 11   glucose blood test strip, Use to check blood sugar 6 x per day, Disp: 200 each, Rfl: 5   HUMALOG 100 UNIT/ML injection, Use 300 units in insulin pump every 48 hours, Disp: 120 mL, Rfl: 1   levocetirizine (XYZAL) 5 MG tablet, Take 1 tablet (5 mg total) by mouth daily as needed for allergies., Disp: 30 tablet, Rfl: 5    lipase/protease/amylase (CREON) 36000 UNITS CPEP capsule, 3 pills with every meal and 2 pills with every snack, Disp: 390 capsule, Rfl: 11   losartan (COZAAR) 50 MG tablet, TAKE 1 TABLET(50 MG) BY MOUTH DAILY, Disp: 90 tablet, Rfl: 1   Mometasone Furoate (ASMANEX HFA) 200 MCG/ACT AERO, For asthma flare, begin Asmanex 200-2 puffs twice a day for 1-2 weeks or until cough and wheeze free, Disp: 13 g, Rfl: 5   montelukast (SINGULAIR) 10 MG tablet, TAKE 1 TABLET BY MOUTH EVERY NIGHT AT BEDTIME, Disp: 30 tablet, Rfl: 5   Multiple Vitamin (MULTIVITAMIN WITH MINERALS) TABS tablet, Take 1 tablet by mouth daily., Disp: , Rfl:    NUCALA 100 MG/ML SOAJ, INJECT $RemoveBefo'100MG'[id number]$  SUBCUTANEOUSLY EVERY 4 WEEKS (GIVEN AT MD  OFFICE), Disp: 1 mL, Rfl: 11   omeprazole (PRILOSEC) 20 MG capsule, Take 1 capsule (20 mg total) by mouth daily., Disp: 30 capsule, Rfl: 11   topiramate (TOPAMAX) 50 MG tablet, TAKE 1 TABLET(50 MG) BY MOUTH TWICE DAILY, Disp: 60 tablet, Rfl: 5   Vitamin D, Ergocalciferol, (DRISDOL) 1.25 MG (50000 UNIT) CAPS capsule, Take 50,000 Units by mouth every 7 (seven) days., Disp: , Rfl:    albuterol (PROAIR HFA) 108 (90 Base) MCG/ACT inhaler, Inhale 2 puffs into the lungs every 6 (six) hours as needed for wheezing or shortness of breath. (Patient not taking: No sig reported), Disp: 1 Inhaler, Rfl: 2   albuterol (PROVENTIL) (2.5 MG/3ML) 0.083% nebulizer solution, Take 3 mLs (2.5 mg total) by nebulization every 4 (four) hours as needed for wheezing or shortness of breath. (Patient not taking: No sig reported), Disp: 75 mL, Rfl: 1   insulin lispro (INSULIN LISPRO) 100 UNIT/ML KwikPen Junior, Inject up to 50 units daily per provider instructions (Patient not taking: No sig reported), Disp: 15 mL, Rfl: 11   Insulin Pen Needle (  PEN NEEDLES) 32G X 4 MM MISC, Use to inject insulin 6x per day (Patient not taking: No sig reported), Disp: 200 each, Rfl: 5  Current Facility-Administered Medications:    Mepolizumab SOLR 100 mg,  100 mg, Subcutaneous, Q28 days, Valentina Shaggy, MD, 100 mg at 02/16/19 1018  Facility-Administered Medications Ordered in Other Visits:    gadopentetate dimeglumine (MAGNEVIST) injection 20 mL, 20 mL, Intravenous, Once PRN, Dohmeier, Asencion Partridge, MD  Allergies as of 04/09/2021 - Review Complete 04/09/2021  Allergen Reaction Noted   Flonase [fluticasone] Shortness Of Breath 06/10/2017   Sulfa antibiotics  12/10/2020   Molds & smuts Other (See Comments) 03/09/2018   Sulfites Itching and Swelling 02/04/2018   Tetracyclines & related Other (See Comments) 03/09/2018   Lactose intolerance (gi) Other (See Comments) 01/05/2021   White birch Cough 04/10/2020    1. Work and Family: He can no longer work part-time at home. 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  2. Activities: He walks every day for short distances, but is in pain.  3. Smoking, alcohol, or drugs: He has not been drinking any alcohol or beer. No tobacco or drugs. 4. Primary Care Provider: His PCP is Ms Mattie Marlin, PA, [facility].  5. Neurology: Dr. Brett Fairy was his neurologist, but now he goes to [facility].  6. Ophthalmology:  7. Retinology: Dr. Zadie Rhine 8. Urology: Dr. Jeffie Pollock at [facility] 9. Sports medicine: Dr. Oneida Alar and Dr. Barbaraann Barthel 10. Therapy: Ms Rea College, RN, MSN  REVIEW OF SYSTEMS: There are no other significant problems involving [name]'s other body systems.   Objective:  Vital Signs:  BP 122/74 (BP Location: Right Arm, Patient Position: Sitting, Cuff Size: Normal)   Pulse 78   Wt 207 lb (93.9 kg)   BMI 28.07 kg/m  BP 112/82   Ht Readings from Last 3 Encounters:  04/08/21 6' (1.829 m)  03/18/21 6' (1.829 m)  03/13/21 6' (1.829 m)   Wt Readings from Last 3 Encounters:  04/09/21 207 lb (93.9 kg)  04/09/21 207 lb (93.9 kg)  04/08/21 205 lb (93 kg)    PHYSICAL EXAM:  Constitutional: The patient appears overweight, still somewhat tired, but  much better. He has lost 7 pounds on our scales since his last visit.His color is better. He looks much better than he feels. His affect is better. The volume and intensity of his speech have increased.   He looks better, in that he is more engaged and does not appear as weak as he did. His insight is fair. He continues to improve.   I deferred the remainder of the exam to day in order to concentrate on his issues related to his Dexcom and insulin pump.  LAB DATA:   Labs 04/09/21: CBG 209  Labs 03/12/21: CBG 169  Labs 02/27/21: CBG 165  Labs 01/30/21: HbA1c 6.3%, CBG 163; Urine glucose negative, ketones negative  Labs 12/25/20: CBG 199   Labs 2/18-2/19/22: CBG 285; CBC normal; U/A >500 glucose, negative ketones; BMP normal, except CO2 20 and glucose 294; CK 77 (ref 49-397); hepatic panel normal  Labs 12/05/20: HbA1c 7.5%, CBG 165; BHOB  0.2 (ref 0.04-0.27); venous pH 7.284;   Labs 08/16/20: HbA1c 6.2%, CBG 215  Labs 05/14/20: HbA1c 6.5%, CBG 176; TSH 2.44, free T4 1.1, free T3 3.5; urinary microalbumin/creatinine ratio 2 (ref <30)  Labs 04/17/20: ESR 6 (ref 0-15), CK 82 (ref 49-439)  Labs 07/12/19: HbA1c 6.4%, CBG 231  Labs 04/26/19:  HbA1c 6.8%, CBG 230  Labs 12/30/18: TSH 2.07, free T4 1.1, free T3 3.2, TPO antibody <1, thyroglobulin antibody <1; CMP normal, except glucose 191; cholesterol 165, triglycerides 138, HDL 33, LDL 106; urinary microalbumin/creatinine ratio 5; vitamin B1 10 (ref 8-30), vitamin B6 3.4 (ref 2.1-21.7), vitamin B12 451 (ref [phone])  Labs 11/29/18: CBG 179  Labs 11/29/18: CMP with glucose 198, chloride 109 (ref 96-106, CO2 17 (ref 20-29); CBC with WBC 11.6 (ref 3.4-10.8), PMNs 8.6 (ref 1.4-7.0), and monocytes 1.0 (ref 0.1-0.9)  Labs 10/28/18: HbA1c 6.9%, CBG 155  Labs 10/06/18: ANA titer negative, ANCA titers negative, rheumatoid factor negative, cyclic citrullin peptide 7 (ref 0-190  Labs 07/27/18: CBG 193  Labs 05/31/18: HbA1c 6.6%, CBG 202  Labs 03/18/18: CBG  178  Labs 01/13/18: HbA1c 6.8%, CBG 194; TSH 0.98, free T4 1.3; free T3 3.6; CMP normal except glucose 191; urinary microalbumin/creatinine ratio 35 (ref <30)  Labs 11/17/17: CBG 170  Labs 10/13/17: HbA1c 6.8%, CBG 191  Labs 08/24/17: CBG 117  Labs 07/20/17: CBG 216  Labs 07/08/17: IgA, IgG, and IgM normal. CBC normal.  Labs 06/16/17: HbA1c 6.5%, CBG 193  Labs 05/15/17: CBG 209; TSH 0.90, free T4 1.2, free T3 3.8  Labs 03/05/17: HbA1c 7.3%, CBG 241  Labs 02/20/17: CBG 180 fasting  Labs 01/29/17: CBG 148  Labs 01/12/17: CBG 116  Labs 12/29/16: HbA1c 9.1%, CBG 223; TSH 0.62, free T4 1.1, free T3 3.5; urine microalbumin/creatinine ratio 22; cholesterol 171, triglycerides 93, HDL 40, LDL 112;  CMP glucose 201  Labs 09/26/16: HbA1c 9.5%  Labs 08/13/16: Celiac panel negative; CMP normal except for glucose 207; CBC normal  Labs 06/13/16: HbA1c 9.8%; LH 3.6, FSH 2.3, testosterone 533 (ref 250-827), free testosterone 59.4 (ref 47-244)  Labs 02/11/16: HbA1c 9.2%  Labs 01/07/16: HbA1c 10.5%; CBC with Hgb elevated at 17.2%; LH 3.9, FSH 2.0; TSH 1.53, free T4 1.3, free T3 3.7; cholesterol 262, triglycerides 171, HDL 40, LDL 188; urinary microalbumin/creatinine ratio 31; CMP normal except for glucose 181  Labs 06/29/15. HbA1c 9.6%  Labs 12/28/14: Hemoglobin A1c 9.5% today, compared with 9.5%, at last visit and with 9.9% at the prior visit.  Labs 08/03/14: HbA1c 9.5%; TSH 0.548,free T4 1.10, free T3 3.7, TPO antibody < 1, TSI 30; CMP normal except glucose 191; urinary microalbumin/creatinine ratio 27; cholesterol 260, triglycerides 90, HDL 51, LDL 191   Labs 10/05/13: HbA1c 9.9%  Labs 04/23/13: CMP normal, except glucose 144; cholesterol 216, triglycerides 97, HDL 39, LDL 158; urinary microalbumin/creatinine ratio 6.9; TSH 0.505, free T4 1.17, free T3 3.8  Labs: 01/21/12: TSH 1.251, free T4 1.10, free T3 3.6, CBC normal, CMP normal except for glucose 206, urinary microalbumin/creatinine ratio 6.3,  cholesterol 187, triglycerides 67, HDL 45, LDL 129   Assessment and Plan:   ASSESSMENT:  1. T1DM:   A. In 2021, his SGs had been mostly <200, reflecting fairly good DM control, but at the cost of severely curtailing his food intake. [name] had done very well at trying to optimize his BG control. This level of BG control without significant hypoglycemia in October 2021 was a testament to the power of using auto mode in insulin pump-CGM technology and to [name]'s determination to keep his BGs under control.  B. Unfortunately, thereafter he had many site problems and sensor problems. BGs and HbA1c were much higher in February 2022. His HbA1c increased to 7.5%%, compared to 6.2% in October 2021. He was also not having too many low BGs in October.  C. In early April 2022, however, the HbA1c had decreased to 6.3% and he was having more lower BGs, despite severely reducing the amount of insulin he took daily. As noted above, he was diagnosed with pancreatic exocrine deficiency in April 2022.   D. Since starting and increasing Creon, he feels and looks somewhat better. Unfortunately, he is so focused on his blood sugar issues and the discrepancies between his Dexcom and his CBGs that he has difficulty realizing that he is improving clinically. He is very carefully slowly increasing his food intake.  2. Hypoglycemia: He has had a few low BGs this month. He is slowly increasing his carb intake.   3. Hypertension: His BP is normal today.  4. Combined hyperlipidemia:   A. His lipids in March 2017 were too high, but the lipids in March 2018 were much better on atorvastatin. He discontinued the atorvastatin in mid-2018 due to concern that this medication might be causing his neuromuscular symptoms.   B. Ms Frederico Hamman re-started his atorvastatin several months ago. His lipids in March 2022 were worse. We will follow up on this issue after his pancreatic exocrine insufficiency stabilizes. 5. Autonomic neuropathy with  tachycardia and gastroparesis: His gastroparesis has improved in parallel with his BG improvement. His heart rate has improved as well.  6. Obesity: Weight has increased. He is trying to eat more.  7. Goiter:  A. His thyroid gland had shrunk back to normal size at a previous visit, but had increased slightly at two previous visits and remains slightly enlarged today. His thyroiditis is clinically quiescent.   B. The active thyroiditis that he has had in the past and the  process of waxing and waning of thyroid gland size are c/w evolving Hashimoto's thyroiditis.   C. He was euthyroid in March 2013, but was borderline hyperthyroid in June 2014. He was euthyroid in October 2015, March 2017, March 2018, July 2018, in March 2019, in March 2020, in July 2021, and in April 2022.  9. Fatigue: This problem is improving slowly, but progressively.  10. Obstructive sleep apnea: He did not meet the criteria for OSA, but because of his snoring a dental appliance was suggested. I recommended that he obtain a dental appliance. Unfortunately, his former dentist no longer accepts [employer], so [name] has to find a new dentist. 60. Adult ADD: He had an appointment with Dr. Carlena Hurl, MD in [place] in November 2018. Dr. Wylene Simmer is waiting for [name]'s medical situation to stabilize before beginning any new psych meds.   12. Proliferative diabetic retinopathy: As above.  13. URI/nasal congestion/vertigo/allergic rhinitis and conjunctivitis: He is having more of these problems today. The monthly injections had really helped him two years ago. He is coordinating with his allergist to re-start the injections. .   14. Neuromuscular symptoms and headaches/increased intracranial pressure:   A. It appeared previously that some of [name]'s headaches were classic tension headaches. When he performed cervical stretching exercises the headache resolved.   B. In the past when Dr. Brett Fairy evaluated [name] for his vague  neuromuscular symptoms, she did not find any signs of any serious neurologic disease. His MRI of his brain was unremarkable. At that time his neuromuscular symptoms had resolved.  C. Later, however, the headaches worsened and some of the neuromuscular symptoms recurred. We now know that he had increased ICP and that his headaches improved for a brief time after the LP. He then developed a typical spinal tap headache.  D. In retrospect  he may have had intermittent problems with elevated ICP previously.  E. Dr. Brett Fairy suspects that [name]'s ICP was due to the doxycycline.  F. He appeared to have problems taking his prior dose of Diamox, but is doing better on the combination of a lower dose of Diamox and Topamax.   G. He is being evaluated at [facility] neurology now.  15. Calcified lymph node: It is possible that he may have contracted a fungus while he was working in the [employer]. several years ago. His thyroid US showed his goiter, but no intrinsic thyroid abnormality. A partially calcified cervical lymph node on the right was noted, c/w a post infections/inflammatory node. 16. Microalbuminuria: His ratio was mildly elevated in March 2019. He needed tighter BG and BP control. His ratio in March 2020 and again in July 2021 was very good.  17. Anhedonia: [name] is still depressed, in part because he is not improving as much and as rapidly as he wanted and because of his frustration with his Dexcom and T-Slim pump. He is tired because he is spending too much time at night to control his BGs.  He desperately wants to be normal. His sessions with Ms Rea College, RN, MSN, a psychiatric nurse specialist, have been very beneficial. He still needs to find more sources of fun.  18. Neck pain/Cervical neuritis/neuromuscular pain: [name] has seen Dr. Barbaraann Barthel who has ordered PT for [name]. [name] is doing better with PT and his stamina is improving.    19. Cerebral arteriosclerosis: It is unclear to me whether the  lesions seen on angiography and MRA are amenable to vascular surgical techniques. 20. Vitamin D deficiency: His 25-OH vitamin D value in April 2022 was mid-normal.   PLAN:  1. Diagnostic: We reviewed his pump and Dexcom printouts today.  2. Therapeutic: He will see Dr. Barbaraann Barthel in Sports Medicine in follow up. He will also see PT and nutrition.  Dr. Lovena Le and I will see him in follow up on 04/15/21 at 12:30 PM. We will download his BG meters, his Dexcom, and his Freestyle Libre then and try to make sense of the data.  Continue current pump settings   Basal rates: basal 1 MN:0.99 6 AM: 0.975 2 PM: 1.35 8 PM: 1.30   ISFs: 35   ICRs: MN: 7 8 AM: 4.5 11 AM: 4.5 9 PM: 7   BG targets:  MN: 130-150 6 AM: 120 10 PM 130-150  3. Patient education: We discussed all of the above at great length.  4. Follow up: 12;30 PM on 04/15/21.   Level of Service: This visit lasted in excess of 75 minutes. More than 50% of the visit was devoted to coordinating care and documenting this encounter.    Tillman Sers, MD, CDE Adult and Pediatric Endocrinology

## 2021-04-10 NOTE — Progress Notes (Addendum)
S:     Chief Complaint  Patient presents with   Diabetes    Education     Endocrinology provider: Dr. Fransico Michael   Patient referred to me by Dr. Fransico Michael for insulin pump initiation and training. 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 Patient wears a t:slim X2 insulin pump and Dexcom G6 CGM. Patient was started on t:slim X2 insulin pump on 02/07/21. Patient has been followed for pump adjustments / pump education since initiation of t:slim X2 insulin pump.  At prior appt with myself on 04/09/21, TIR was 67%; extremely close to goal > 70%. No hypoglycemia. Patient was extremely anxious regarding Dexcom G6 CGM readings being inaccurate. Reviewed BG reading he provided; all Dexcom vs manual glucometer readings were within 20% as BG was > 80 mg/dL (per FDA guidance) EXCEPT for 5 BG readings. I was concerned that he reports there were 2 episodes of nocturnal hypoglycemia per manual BG meter (BG ~70), however, [name] reports that Dexcom  reading was ~150. There was no evidence of these readings in his Dexcom report and there was no manual Bgreport download to review this. Discussed with patient how nocturnal hypoglycemia is concerning. [name] is adamant about wanting to wear CGM, however, is extremely hesitant/anxious about using Control IQ algorithm with Dexcom readings when Dexcom. Compromised with patient. He will wear Dexcom G6 and FSL 2.0 CGM (provided 2 samples of FSL 2.0 CGM sensors). He will enter manual mode with pump. He will monitor BG readings prior to meals. He will keep a BG monitor log; write down date / time / Dexcom BG reading & arrow / Freestyle Libre 2.0 BG reading & arrow / manual glucometer BG reading. Write  down any notes. I am curious to see if his exocrine pancreas disorder is the underlying cause of inaccurate dexcom readings considering his body cannot appropriately process carbohydrates which may impact CGM ability to appropriately determine BG readings. Will ensure this data also gets to a Dexcom representative. Patients with exocrine pancreas disorders typically were not included in Dexcom clinical trials. Also advised patient to keep a thorough food diary (date/ time/ food items/ carb per food items / notes) to hopefully overcome how he enters ghost carbs.   Patient presents today for follow up appt. Patient brings in his BG log and food log. He filled out typically checked BG 5-9x daily with Dexcom G6, FSL 2.0, and onetouch glucometer. He used Accu Chek meter, but did not write these readings down. He has been trying to eat 30-40 grams of carb the past few days. He reports occasionally having issues with his sites still; sites work best in his lower back. Sensors work best in his upper arm. [name] states that these two sensors have been the most accurate sensors he has ever used. He states he has tried FSL 2.0 CGM twice previously and both sensors were inaccurate (does not have any data to show this). He reports he turned on control IQ on 04/11/21 since BG readings have been more accurate.  Patient reports both CGM sensors appear to become inaccurate after 4-5 days.  Insurance: Development worker, international aid (Max: 3.0) 12AM 1.1  Total: 26.4 units   Insulin to carbohydrate ratio (ICR)  12AM 10  11AM  8                    Max Bolus: 25   Insulin Sensitivity Factor (ISF) 12AM 50                             Target BG 12AM 110                             Pump Serial Number: [id number]   Infusion Set: Trusteel 6 mm   O:   Labs:   Freestyle Libre 2.0 CGM Report    Dexcom G6 CGM Report     Tconnect Report  Patient continues to show a pattern  of underbolusing at meals (typically boluses ~5g of carb per meal; most he bolused for was ~28). He enters 1-3 "ghost carbs" commonly to adjust insulin independently.   There were no vitals filed for this visit.    Lab Results  Component Value Date   HGBA1C 6.3 (A) 01/30/2021   HGBA1C 7.5 (A) 12/05/2020   HGBA1C 6.2 (A) 08/16/2020    Lab Results  Component Value Date   CPEPTIDE <0.1 (L) 01/30/2021       Component Value Date/Time   CHOL 197 01/24/2021 0853   TRIG 111 01/24/2021 0853   HDL 40 01/24/2021 0853   CHOLHDL 4.9 01/24/2021 0853   VLDL 19 12/29/2016 1146   LDLCALC 135 (H) 01/24/2021 0853    Lab Results  Component Value Date   MICRALBCREAT 2 05/14/2020    Assessment: DM Management / education- TIR is not at goal > 70%. No hypoglycemia. Please referf to media scan to review patient's BG logs (Dexcom vs glucometer vs Freestyle Libre 2.0) It appears per patient report that Freestyle Libre 2.0 CGM is more accurate than Dexcom G6. All FSL 2.0 CGM readings were within 20% of glucometer readings. There were 2 Dexcom readings that were not within 20%. [name] states that these two sensors have been the most accurate sensors he has ever used. He states he has tried FSL 2.0 CGM twice previously and both sensors were inaccurate (does not have any data to show this). He reports he turned on control IQ on 04/11/21 since BG readings have been more accurate. Will have him use control IQ for now. If he feels Dexcom readings are inaccurate he will enter FSL 2.0 CGM readings into pump and switch back to manual mode. Continue wearing Dexcom G6 and FSL 2.0 CGM. Monitor BG reading via OneTouch glucometer before meals and bedtime. If CGM readings continue to be inaccurate after 4-5 days (per pt report) then will move forward with prior authorization for additional CGM sensors. Continue all insulin pump settings. Follow up 1 month.   Plan: Continue insulin pump settings  Monitoring:  Continue  wearing Continue Dexcom G6 and [employer] 2. CGM Theda Sers Whiteley has a diagnosis of diabetes, checks blood glucose readings > 4x per day, wears an insulin pump, and requires frequent adjustments to insulin regimen. This patient will be seen every six months, minimally, to assess adherence to their CGM regimen and diabetes treatment plan. Follow Up: 1 month  This appointment required 60 minutes of patient care (this includes precharting, chart review, review of results, face-to-face care, etc.).  Thank you for involving clinical pharmacist/diabetes educator to assist  in providing this patient's care.  Zachery Conch, PharmD, CPP, CDCES  I have reviewed the following documentation and am in agreeance with the plan. I was immediately available to the clinical pharmacist for questions and collaboration. Molli Knock, MD, CDE

## 2021-04-15 ENCOUNTER — Other Ambulatory Visit: Payer: Self-pay

## 2021-04-15 ENCOUNTER — Ambulatory Visit (INDEPENDENT_AMBULATORY_CARE_PROVIDER_SITE_OTHER): Payer: 59 | Admitting: Pharmacist

## 2021-04-15 VITALS — Wt 207.6 lb

## 2021-04-15 DIAGNOSIS — E1065 Type 1 diabetes mellitus with hyperglycemia: Secondary | ICD-10-CM | POA: Diagnosis not present

## 2021-04-15 LAB — POCT GLUCOSE (DEVICE FOR HOME USE): POC Glucose: 228 mg/dl — AB (ref 70–99)

## 2021-04-22 ENCOUNTER — Ambulatory Visit: Payer: 59 | Admitting: Dietician

## 2021-05-06 ENCOUNTER — Encounter: Payer: 59 | Attending: Family Medicine | Admitting: Dietician

## 2021-05-06 ENCOUNTER — Other Ambulatory Visit: Payer: Self-pay

## 2021-05-06 DIAGNOSIS — E108 Type 1 diabetes mellitus with unspecified complications: Secondary | ICD-10-CM | POA: Insufficient documentation

## 2021-05-06 NOTE — Patient Instructions (Addendum)
Continue to aim for variety (non-starchy vegetables, whole grains, unprocessed starches, small portions fresh fruit for example)   Goals Established by [email or web address] Do basic meal planning to help get variety in your diet Be sure that you are adding healthy fats with each meal             Olive oil, avocados, nuts as tolerated Consider Fairlife milk Consider MCT oil Continue the Creon as prescribed Consider resuming Physical therapy Consider enquiring about [employer] [phone] Eat when hungry in addition to meals

## 2021-05-06 NOTE — Progress Notes (Signed)
Medical Nutrition Therapy  Appointment Start time:  1455  Appointment End time:  1540  Patient is here today alone.  He was last seen 04/04/2021. Patient states that he has slowly incorporating more foods into his diet and more carbohydrates.  He is asking what else he needs to round out his diet.  He states that this Dexcom sensor now seems to be giving more accurate readings. - TIR 69% over the past 2 weeks. He still does not trust the Dexcom enough to use Control IQ at night.  He continues to wakes every 2 hours to check his blood glucose.- Discussed his poor quality sleep as well as support.  He lives with his parents but states that he cannot rely on them to know what to do in the event of severe hypoglycemia or other emergency.  His mother will cook him something simple occasionally.  He is cooking much of his own food and has little variety but is working at keeping his protein intake up.  Brief check of his protein intake was about 110 grams per 24 hour recall. He states that he is tired frequently and week due to loss of muscle mass.  He states that this has not improved significantly recently. He went to 2 PT sessions and is waiting until he can get the sensors to work better. Patient states that his joints are having  He continues to have to change out his pump site every 1 1/2-2 days but this is better than daily. Weight overall stable.  He is hypoglycemia unaware but has not experienced any lows in the past month.     He saw our CDCES, Bonita Quin spring 2022 to problem solve dexcom issues.  He states he does not have confidence in the Dexcom readings and takes the pump off Control IQ at night and wakes every 2 hours to check his blood sugar due to this. - He has not regained confidence in the Dexcom enough to use the Control IQ at night.  Referral diagnosis: pancreatic insufficiency, type 1 diabetes Preferred learning style: no preference indicated Learning readiness: ready, change in  progress   NUTRITION ASSESSMENT   Anthropometrics  6' 207 lbs 05/06/2021 205 lbs (decreased 4 lbs x 2 weeks) 208 lbs 03/12/21 and 03/18/2021 Feels best at 215 lbs UBW 230-240 lbs prior to pancrease issue   Clinical Medical Hx: Type 1 diabetes, pancreatic insufficiency, diabetic retinopathy, neuropathy, gastroparesis  Medications: see list to include Creon, Humalog, vitamin D, MVI Labs: CBG -308 (Admitted to ER for hypoglycemia, 01/30/2021) CO2 - 21 (low)  Amylase - 23 (Low) Pancreatic Elastase - 76 (Low) Notable Signs/Symptoms: weight loss Nutrition Focused Physical Exam performed to obtain baseline.  Patient with decreased muscle at scapula/decreased strength.  Loss of fat on upper arm. Nails/eyes WNL.  T-Slim insulin pump:  basal rate 1.1 units per hour when not in Control IQ and bolus of 1 units /er 10 rams of carbohydrates.  Correction bolus is 1 unit per 50 points above 100. Dexcom CGM He is hypoglycemia unaware until glucose of 50.  He sees Dr Fransico Michael and started seeing Dr. Everardo All to have an adult endocrinologist option in the event that Dr. Fransico Michael is not available.     Lifestyle & Dietary Hx Patient lives with his parents due to his inability to walk after hospitalization in April.  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 He states that his  parents do not know the basics about diabetes or emergency situations related to diabetes.  He would like for them to have more information. No alcohol in the past 5 years.  Estimated daily fluid intake:  oz Supplements: MVI, vitamin D,  Sleep: has not slept through the night in the past 6 months due to diabetes monitoring/alarms Stress / self-care: due to chronic and acute illness.  Sees [facility]o, counselor weekly.  Fear of food due to hyperglycemia Current average weekly physical activity: He started PT this week and is to go 1 hour twice per week.     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" First Meal (10-11 am) 2 eggs, 2 slice rye toast, [place] sausage or other meat, mustard, occasional avocado, occasional apple or berries Snack: none Second Meal: usually skips Snack: occasional quest chips (19 grams protein per bag) or protein donut (low fat, 1 carb choice, plus protein) Third Meal ( 8 pm): similar to lunch OR eggs, sausage OR salmon, rye bread, salad, oliv oil, eggs OR tacos with hard corn shells, lettuce and tomatoes Snack: occasional quest chips or egg or beef sticks Beverages: water  Estimated Energy Needs Calories: 2200 Carbohydrate: 192g Protein: 115-130g   NUTRITION DIAGNOSIS  NB-1.1 Food and nutrition-related knowledge deficit As related to adequate nutrition related to health issues .  As evidenced by diet hx and patient report.   NUTRITION INTERVENTION  Nutrition education (E-1) on the following topics:  Concerns of poor quality/interrupted sleep Discussed dexcom concerns Evaluated current protein/calorie intake as adequate Suggested f/u with PT or personal trainer and option for aquatic at the new [employer] variety in meals  Continue Creon and current medication Discussed bowel regime as normal per patient Continue to carbohydrate count  Handouts Provided Include (previous visit) Meal plan card Hypoglycemia symptoms and treatments Diabetes resources  Learning Style & Readiness for Change Teaching method utilized: Visual & Auditory  Demonstrated degree of understanding via: Teach Back  Barriers to learning/adherence to lifestyle change: stress/fear  Goals Established by Pt Continue to aim for variety (non-starchy vegetables, whole grains, unprocessed starches, small portions fresh fruit for example). [email or web address] Do basic meal planning to help get variety in your diet Be sure that you are adding healthy fats with each meal  Olive oil, avocados, nuts as  tolerated Consider Fairlife milk Consider MCT oil Continue the Creon as prescribed Continue Physical therapy Eat when hungry in addition to meals   MONITORING & EVALUATION Dietary intake, weekly physical activity in 1 month.  Next Steps  Patient is to call for questions.

## 2021-05-07 ENCOUNTER — Ambulatory Visit (INDEPENDENT_AMBULATORY_CARE_PROVIDER_SITE_OTHER): Payer: 59 | Admitting: Pharmacist

## 2021-05-08 ENCOUNTER — Ambulatory Visit (INDEPENDENT_AMBULATORY_CARE_PROVIDER_SITE_OTHER): Payer: 59 | Admitting: Gastroenterology

## 2021-05-08 ENCOUNTER — Encounter: Payer: Self-pay | Admitting: Gastroenterology

## 2021-05-08 ENCOUNTER — Other Ambulatory Visit: Payer: Self-pay

## 2021-05-08 VITALS — BP 98/62 | HR 88 | Ht 72.0 in | Wt 207.4 lb

## 2021-05-08 DIAGNOSIS — K8681 Exocrine pancreatic insufficiency: Secondary | ICD-10-CM

## 2021-05-08 NOTE — Progress Notes (Signed)
Review of pertinent gastrointestinal problems: 1.  Nausea, vomiting, poor appetite led to GI testing including EGD April 2022: I found mild gastritis which was negative for H. pylori infection, the examination was otherwise normal, I biopsied his duodenum to check for celiac sprue.  Pathology showed no sign of celiac sprue.  MRI of abdomen showed atrophic pancreas and his pancreatic fecal elastase was very low at 76.  This was in the setting of longstanding diabetes and likely indicated significant pancreatic exocrine insufficiency   HPI: This is a pleasant 40 year old man   I last saw [name] about 2-1/2 months ago.  Since then he has had 12 appointments with other providers.  He was taking Creon 36,002 pills with every meal and 1 pill with every snack.  His weight is actually down a bit since his previous office visit.  He felt weak and fatigued.  His mood was depressed.  I increased his Creon dosing to 3 pills with every meal and 2 pills with a snack.  I also recommended that he start taking omeprazole on an every day basis rather than just once in a while.  He sat down with the dietitian who was somewhat helpful.  He is still having a very difficult time with his blood sugars.  He tells me he finally got some continuous glucose monitor sensors at work fairly well.  He takes Creon 3 pills before every meal 2 pills with every snack.  Sometimes he takes 1/4 pill.  This seems to be based on blood sugars.  He has daily loose stool, 4-5 times a day, never bloody.  His weight is down 3 pounds since his last office visit.  ROS: complete GI ROS as described in HPI, all other review negative.  Constitutional:  No unintentional weight loss   Past Medical History:  Diagnosis Date   ADHD (attention deficit hyperactivity disorder)    Anxiety    Asthma    Autonomic neuropathy due to diabetes (HCC)    Chronic headaches    Diabetes (HCC)    Fatigue    GERD (gastroesophageal reflux disease)    Goiter     High blood pressure    High cholesterol    Hypercholesterolemia    Hypertension    Hypoglycemia associated with diabetes (HCC)    Malnutrition (HCC)    Obesity    Sleep apnea    mild, no cpap   Tachycardia    Type 1 diabetes mellitus not at goal [facility])    Uncontrolled DM with microalbuminuria or microproteinuria     Past Surgical History:  Procedure Laterality Date   EYE SURGERY Right 05/30/2020   Vitrectomy, Dr. Luciana Axe   EYE SURGERY  04/2020   IR 3D INDEPENDENT WKST  07/16/2020   IR ANGIO INTRA EXTRACRAN SEL COM CAROTID INNOMINATE BILAT MOD SED  07/16/2020   IR ANGIO VERTEBRAL SEL SUBCLAVIAN INNOMINATE UNI L MOD SED  07/16/2020   IR ANGIO VERTEBRAL SEL VERTEBRAL UNI R MOD SED  07/16/2020   IR US GUIDE VASC ACCESS RIGHT  07/16/2020   REFRACTIVE SURGERY     x9   WISDOM TOOTH EXTRACTION      Current Outpatient Medications  Medication Sig Dispense Refill   acetaZOLAMIDE (DIAMOX) 250 MG tablet Take 250 mg by mouth 2 (two) times daily.     ALPRAZolam (XANAX) 0.25 MG tablet Take 0.25 mg by mouth at bedtime.     aspirin EC 81 MG tablet Take 162 mg by mouth daily as needed for  moderate pain. Swallow whole.     atorvastatin (LIPITOR) 20 MG tablet Take 20 mg by mouth daily.   0   AUVI-Q 0.3 MG/0.3ML SOAJ injection Use as directed for life-threatening allergic reaction. 2 each 2   budesonide (PULMICORT) 0.5 MG/2ML nebulizer solution Take 2 mLs (0.5 mg total) by nebulization as directed. 2 mL 3   budesonide-formoterol (SYMBICORT) 160-4.5 MCG/ACT inhaler Inhale 2 puffs into the lungs 2 (two) times daily. 1 Inhaler 5   calcium carbonate (OSCAL) 1500 (600 Ca) MG TABS tablet Take 600 mg of elemental calcium by mouth once a week.     Continuous Blood Gluc Receiver (DEXCOM G6 RECEIVER) DEVI 1 Device by Does not apply route as directed. 1 each 2   Continuous Blood Gluc Sensor (DEXCOM G6 SENSOR) MISC Change sensor every 10 days 3 each 11   Continuous Blood Gluc Sensor (FREESTYLE LIBRE 2 SENSOR)  MISC Inject 1 Device into the skin every 14 (fourteen) days. Place prescription on hold. To use if Dexcom CGM fails. 2 each 11   Continuous Blood Gluc Transmit (DEXCOM G6 TRANSMITTER) MISC Change every 90 days 1 each 3   Glucagon (BAQSIMI TWO PACK) 3 MG/DOSE POWD Place 1 spray into the nose as directed. 2 each 3   glucose blood test strip Use to check blood sugar up to 10x daily 300 each 11   glucose blood test strip Use to check blood sugar 6 x per day 200 each 5   HUMALOG 100 UNIT/ML injection Use 300 units in insulin pump every 48 hours 120 mL 1   insulin lispro (INSULIN LISPRO) 100 UNIT/ML KwikPen Junior Inject up to 50 units daily per provider instructions 15 mL 11   Insulin Pen Needle (PEN NEEDLES) 32G X 4 MM MISC Use to inject insulin 6x per day 200 each 5   levocetirizine (XYZAL) 5 MG tablet Take 1 tablet (5 mg total) by mouth daily as needed for allergies. 30 tablet 5   lipase/protease/amylase (CREON) 36000 UNITS CPEP capsule 3 pills with every meal and 2 pills with every snack 390 capsule 11   losartan (COZAAR) 50 MG tablet TAKE 1 TABLET(50 MG) BY MOUTH DAILY 90 tablet 1   Mometasone Furoate (ASMANEX HFA) 200 MCG/ACT AERO For asthma flare, begin Asmanex 200-2 puffs twice a day for 1-2 weeks or until cough and wheeze free 13 g 5   montelukast (SINGULAIR) 10 MG tablet TAKE 1 TABLET BY MOUTH EVERY NIGHT AT BEDTIME 30 tablet 5   Multiple Vitamin (MULTIVITAMIN WITH MINERALS) TABS tablet Take 1 tablet by mouth daily.     NUCALA 100 MG/ML SOAJ INJECT  SUBCUTANEOUSLY EVERY 4 WEEKS (GIVEN AT MD  OFFICE) 1 mL 11   omeprazole (PRILOSEC) 20 MG capsule Take 1 capsule (20 mg total) by mouth daily. 30 capsule 11   topiramate (TOPAMAX) 50 MG tablet TAKE 1 TABLET(50 MG) BY MOUTH TWICE DAILY 60 tablet 5   Vitamin D, Ergocalciferol, (DRISDOL) 1.25 MG (50000 UNIT) CAPS capsule Take 50,000 Units by mouth every 7 (seven) days.     albuterol (PROAIR HFA) 108 (90 Base) MCG/ACT inhaler Inhale 2 puffs into  the lungs every 6 (six) hours as needed for wheezing or shortness of breath. (Patient not taking: No sig reported) 1 Inhaler 2   albuterol (PROVENTIL) (2.5 MG/3ML) 0.083% nebulizer solution Take 3 mLs (2.5 mg total) by nebulization every 4 (four) hours as needed for wheezing or shortness of breath. (Patient not taking: No sig reported) 75 mL 1  Current Facility-Administered Medications  Medication Dose Route Frequency Provider Last Rate Last Admin   Mepolizumab SOLR 100 mg  100 mg Subcutaneous Q28 days Alfonse Spruce, MD   100 mg at 02/16/19 1018   Facility-Administered Medications Ordered in Other Visits  Medication Dose Route Frequency Provider Last Rate Last Admin   gadopentetate dimeglumine (MAGNEVIST) injection 20 mL  20 mL Intravenous Once PRN Dohmeier, Porfirio Mylar, MD        Allergies as of 05/08/2021 - Review Complete 05/08/2021  Allergen Reaction Noted   Flonase [fluticasone] Shortness Of Breath 06/10/2017   Sulfa antibiotics  12/10/2020   Molds & smuts Other (See Comments) 03/09/2018   Sulfites Itching and Swelling 02/04/2018   Tetracyclines & related Other (See Comments) 03/09/2018   Lactose intolerance (gi) Other (See Comments) 01/05/2021   White birch Cough 04/10/2020    Family History  Problem Relation Age of Onset   Dementia Paternal Aunt    Schizophrenia Paternal Aunt    Cancer Maternal Grandmother        type unknown   Diabetes Maternal Grandmother        T2 DM   Cancer Maternal Grandfather        type unknown   Irritable bowel syndrome Mother    Heart disease Father    Hypertension Father    Hyperlipidemia Father    Skin cancer Father    Thyroid disease Sister    Stomach cancer Paternal Aunt    Pancreatic cancer Paternal Aunt    Diabetes Maternal Aunt    Allergic rhinitis Neg Hx    Angioedema Neg Hx    Asthma Neg Hx    Atopy Neg Hx    Eczema Neg Hx    Immunodeficiency Neg Hx    Urticaria Neg Hx    Esophageal cancer Neg Hx    Colon cancer Neg Hx     Colon polyps Neg Hx     Social History   Socioeconomic History   Marital status: Single    Spouse name: Not on file   Number of children: 0   Years of education: Not on file   Highest education level: Not on file  Occupational History   Occupation: Geophysical data processor  Tobacco Use   Smoking status: Never   Smokeless tobacco: Never  Vaping Use   Vaping Use: Never used  Substance and Sexual Activity   Alcohol use: No   Drug use: No   Sexual activity: Not on file  Other Topics Concern   Not on file  Social History Narrative   ** Merged History Encounter **       Social Determinants of Health   Financial Resource Strain: Not on file  Food Insecurity: Not on file  Transportation Needs: Not on file  Physical Activity: Not on file  Stress: Not on file  Social Connections: Not on file  Intimate Partner Violence: Not on file     Physical Exam: BP 98/62   Pulse 88   Ht 6' (1.829 m)   Wt 207 lb 6.4 oz (94.1 kg)   BMI 28.13 kg/m  Constitutional: generally well-appearing Psychiatric: alert and oriented x3 Abdomen: soft, nontender, nondistended, no obvious ascites, no peritoneal signs, normal bowel sounds No peripheral edema noted in lower extremities  Assessment and plan: 40 y.o. male with exocrine pancreatic insufficiency from longstanding type 1 diabetes  Gave and I had a long discussion today.  He is taking which should be adequate pancreatic enzyme supplements several times a day  in addition to proton pump inhibitor once daily.  Despite this he has lost another 3 pounds in 2 months.  He also continues to have chronic loose stools.  I offered colonoscopy to see if anything else could be going on causing his chronic loose stools.  I also recommend a trial of daily scheduled Imodium 1 pill.  He asked about referral for pancreatic islet cell transplant and I admitted to him that I have never referred the patient for that type of thing.  He told me that he is currently  scheduled to meet with an endocrinologist at [facility] sometime in the next 4 or 5 months.  I offered to arrange referral to [facility] gastroenterology for their opinion on his significant pancreatic exocrine insufficiency and chronic loose stools.  He likes this idea and sees it as a way to possibly expedite eventual referral for pancreatic islet cell transplant.  We will therefore refer to [facility] gastroenterology for severe exocrine pancreatic insufficiency.    Please see the "Patient Instructions" section for addition details about the plan.  Rob Bunting, MD [facility] 05/08/2021, 11:10 AM   Total time on date of encounter was 25 minutes (this included time spent preparing to see the patient reviewing records; obtaining and/or reviewing separately obtained history; performing a medically appropriate exam and/or evaluation; counseling and educating the patient and family if present; ordering medications, tests or procedures if applicable; and documenting clinical information in the health record).

## 2021-05-08 NOTE — Patient Instructions (Signed)
We will be referring you over to [facility] regarding your exocrine pancreatic insufficiency. If you do not hear back about an appointment within the next 3 weeks, please call us at [phone].  Please purchase the following medications over the counter and take as directed: Imodium 1 tablet once daily for diarrhea  If you are age 39 or younger, your body mass index should be between 19-25. Your Body mass index is 28.13 kg/m. If this is out of the aformentioned range listed, please consider follow up with your Primary Care Provider.   __________________________________________________________  The [place] [facility] providers would like to encourage you to use [facility] to communicate with providers for non-urgent requests or questions.  Due to long hold times on the telephone, sending your provider a message by [facility] may be a faster and more efficient way to get a response.  Please allow 48 business hours for a response.  Please remember that this is for non-urgent requests.   Due to recent changes in healthcare laws, you may see the results of your imaging and laboratory studies on MyChart before your provider has had a chance to review them.  We understand that in some cases there may be results that are confusing or concerning to you. Not all laboratory results come back in the same time frame and the provider may be waiting for multiple results in order to interpret others.  Please give [place] 48 hours in order for your provider to thoroughly review all the results before contacting the office for clarification of your results.

## 2021-05-13 ENCOUNTER — Other Ambulatory Visit: Payer: Self-pay

## 2021-05-13 ENCOUNTER — Ambulatory Visit (INDEPENDENT_AMBULATORY_CARE_PROVIDER_SITE_OTHER): Payer: 59 | Admitting: Family Medicine

## 2021-05-13 ENCOUNTER — Encounter: Payer: Self-pay | Admitting: Family Medicine

## 2021-05-13 VITALS — BP 120/74 | Ht 72.0 in | Wt 207.0 lb

## 2021-05-13 DIAGNOSIS — M791 Myalgia, unspecified site: Secondary | ICD-10-CM

## 2021-05-13 NOTE — Progress Notes (Signed)
[name] - 40 y.o. male MRN [id number]  Date of birth: 1981-07-18  SUBJECTIVE:    CC: Joint Pain  3/9: Patient reports over the past 2 months he has felt increasingly weak. Primary complaint today is feeling like his feet and hands are 'down to the bone' and it feels uncomfortable when holding the steering wheel, walking. Feels at times like he cannot hold his body weight. Of note he's had difficulty with his insulin pump in getting sites to work (better past 5 days). Relatedly he's lost about 10+ pounds over the past 1-2 months. Reports large caloric deficit taking in 800-900 calories a day over past couple months. Denies numbness or tingling in feet or hands. Microfilament testing has been normal per endo notes. He is also scheduled to see neurologist tomorrow. Has regular appointments with diabetes educator but not dietitian. Denies nausea or vomiting though appetite is not what it has been. Autonomic dysfunction noted in chart - asked him about gastroparesis and he reports history of this being mentioned remotely.   3/23: Patient reports he feels like he's worse than last visit. Biggest problem is recent stomach virus with nausea, diarrhea, abdominal pain - feels this has improved but was present for several days. Blood sugars not increasing with his carbohydrate intake - even had to turn off his basal rate because blood sugar was dropping. This morning it appears to be rising in response to diet, had no ketones this morning and basal is now back on. Stomach feels crampy. Has not seen GI in past. Started BRAT diet as recommended by Dr. Fransico Michael. Had initial visit to dietitian and doesn't follow up for about 3-4 more weeks - most discussion was general in the initial visit.   4/6: [name] comes in today very fatigued, run down. He reports he is not even running his insulin pump 90% of the time and maximum bolus he's given himself over more than a week was 1 unit of  insulin. Blood sugars still not responding to his dietary intake. He's consuming more than 2000 calories a day and has been tracking this regularly on an app. Eating variety of things but primarily eggs, sausage, bread, applesauce throughout the day. He is not sleeping because of concern about his blood sugars dropping overnight and not being surrounded by people who know how to take care of this if it were to happen. Takes more than an hour for blood sugar to stabilize when it starts to drop despite drinking sugar-containing beverages. He had EGD with Dr. Christella Hartigan after evaluation - some gastritis and biopsies sent to assess for celiac but otherwise normal test. Gastric emptying study was normal in 2019. Has a pancreatic MRI scheduled for Saturday. Reports he's not sure how long he can continue like this though denied suicidal ideation when asked directly and no plan.   5/9: Patient was admitted to the hospital after last visit and had extensive workup. Found to have exocrine pancreatic insufficiency. Some concern his frequent monitoring is causing increased distress leading to decreased sleep and anxiety. They had a discussion regarding switch from pump to basal + bolus but patient would like to continue with pump - we separately discussed this today as well. Other labwork including for celiac, thyroid dysfunction, adrenal insufficiency normal. No insulinoma on imaging. He's interested in a more specific dietary plan than he's received to date - Dr. Christella Hartigan looked into this for him and so far no luck. He continues to have site issues having to change  daily. Tried walking for exercise once - 30 minutes on treadmill and this caused quite a bit of joint pain subsequent days.  5/22: He has lost 2 pounds since last visit.  Started PT which has made him more fatigued. Reports not helping much yet.  Knee pain is bothering him the most recently. Feels like bone on bone because he has no muscle tone  to help support joints.  He is still having insulin pump and sensor problems.  He has to check his sugars every hour and dose as needed. He is even waking up hourly overnight.  Talked to a dietitian last week. His mood is down still. He does take some alprazolam at night.   7/18: Patient returns reporting improvements in his physical activity. He is able to stand for up to 30 minutes at a time now and walk on treadmill 10 minutes at a time. Has been doing some easy exercises at home but no longer going to PT. He is working with endocrinology on his CGM, site issues. He is going to see specialists in GI and endocrinology later this year at [facility]. Has notable severe exocrine pancreatic insufficiency in addition to his longstanding T1DM  Objective:  VS: BP:120/74  HR: bpm  TEMP: ( )  RESP:   HT:6' (182.9 cm)   WT:207 lb (93.9 kg)  BMI:28.07 PHYSICAL EXAM:  Gen: NAD, but depressed affect.  ASSESSMENT & PLAN:  1. Hypoglycemia, Type 1 DM - Encouraged that patient has been able to increase his physical activity.  He will continue to slowly increase this.  With his muscle wasting in hands and feet we reviewed some strengthening exercises he could do here.  He has appointments in future with [facility] and endocrinology, ? If he is a candidate for islet cell transplant.

## 2021-05-13 NOTE — Patient Instructions (Signed)
Finish the antibiotic even if this looks much better. Great job on advancing your exercise - keep it up! Add those exercises for the muscles of your hands and feet. Follow up with me in 6 weeks for reevaluation, message me with any questions in the meantime.

## 2021-05-13 NOTE — Progress Notes (Deleted)
   [name] is a 40 y.o. male who presents to [facility] today for the following:  ***   PMH reviewed. *** ROS as above. Medications reviewed.  Exam:  BP 120/74   Ht 6' (1.829 m)   Wt 207 lb (93.9 kg)   BMI 28.07 kg/m  Gen: Well NAD MSK:  No results found.   Assessment and Plan: 1) No problem-specific Assessment & Plan notes found for this encounter.   Yisell Sprunger, DO

## 2021-05-16 NOTE — Progress Notes (Signed)
S:     Chief Complaint  Patient presents with   Diabetes    Education      Endocrinology provider: Dr. Fransico Michael  Patient referred to me by Dr. Fransico Michael for insulin pump initiation and training. I have been assisting with further diabetes management per Dr. Fransico Michael and patient's preference. 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 Patient wears a t:slim X2 insulin pump and Dexcom G6 CGM. Patient was started on t:slim X2 insulin pump on 02/07/21. Patient has been followed for pump adjustments / pump education since initiation of t:slim X2 insulin pump.  At prior appt with myself on 04/15/21, TIR was ~50-60%. No hypoglycemia.Please refer to media scan to review patient's BG logs (Dexcom vs glucometer vs Freestyle Libre 2.0) It appeared per patient report that Freestyle Libre 2.0 CGM is more accurate than Dexcom G6. All FSL 2.0 CGM readings were within 20% of glucometer readings. There were 2 Dexcom readings that were not within 20%. [name] states that these two sensors have been the most accurate sensors he has ever used. He states he has tried FSL 2.0 CGM twice previously and both sensors were inaccurate (does not have any data to show this). He reported he turned on control IQ on 04/11/21 since BG readings have been more accurate. Will have him use control IQ for now. If he feels Dexcom readings are inaccurate he will enter FSL 2.0 CGM readings into pump and switch back to manual mode. Continue wearing Dexcom G6 and FSL 2.0 CGM. Monitor BG reading via OneTouch glucometer before meals and bedtime. If CGM readings continue to be inaccurate after 4-5 days (per pt report) then will move forward with prior authorization for  additional CGM sensors. Continued all insulin pump settings.  Patient presents today for follow up appt. This is a joint appt with Dr. Fransico Michael. He remains experiencing issues with his Dexcom and FSL 2.0 CGM sensors. He feels they are inaccurate. He states he frequently has to exit control IQ and go into manual mode. He recently started Keflex and mupirocin ointment for an infection. He feels abx affect his BG readings; he has been separating abx from meals. He has been eating the same meals and his BG readings have not been rising as much   Insurance: Development worker, international aid (Max: 3.0) 12AM 1.1                           Total: 26.4 units   Insulin to carbohydrate ratio (ICR)  12AM 10  11AM  8                    Max Bolus: 25   Insulin Sensitivity Factor (ISF) 12AM 50                             Target BG 12AM 110                             Pump Serial Number: [id number]   Infusion Set: Trusteel 6 mm  Infusion Set Sites -Patient reports infusion sites are thighs, lower back --Patient reports independently doing infusion set site changes --Patient reports rotating infusion  set sites Getting [employer]el and [employer] sites as of right now   Diet  (no changes since prior appt on 04/15/21) Patient reported dietary habits: -Breakfast (10-11am; took 3 capsule of Creon): 2 eggs, sausage link, [place], 1 slice of bread (15-16 g of carb), mustard -Lunch (~2pm): sandwich (~30 grams of carb) , mixed vegetables -Snack (sometimes, 2 capsule of Creon): nuts, [place], broccoli, fig bar -Dinner (~7:30 pm; 3 capsules of Creon): eggs, chicken, broccoli, baby carrots, 1 slice of bread (15-16 g of carb) -Snack (~12-1am, always snacks at this dinner; 2 capsule of Creon): quest protein chips, nuts, [place], fig bar, pepperoni sticks -Fast food: none -Sweets/deserts: none -Pasta/rice: none -Fruit: 1 or 2 slices of apple, blueberries -Cereal: none -Chips/crackers: quest protein  chips -Milk: none (lactose intolerant) -Soda/juice: none Now consuming more olive oil, avocado   Exercise (no changes since prior appt on 04/15/21) Patient-reported exercise habits:  PT ([name]) 1-2x/week about 1 hours each time          O:   Labs:   Freestyle Libre 2.0 CGM Report    Dexcom G6 CGM Report     Tconnect Report  Patient continues to show a pattern of underbolusing at meals (typically boluses ~5g of carb per meal; most he bolused for was ~14). He enters 1-3 "ghost carbs" commonly, but states he does this when he is eating low carb snacks (not to correct for elevated BG readings).   Vitals:   05/21/21 1246  BP: 116/74      Lab Results  Component Value Date   HGBA1C 6.3 (A) 01/30/2021   HGBA1C 7.5 (A) 12/05/2020   HGBA1C 6.2 (A) 08/16/2020    Lab Results  Component Value Date   CPEPTIDE <0.1 (L) 01/30/2021       Component Value Date/Time   CHOL 197 01/24/2021 0853   TRIG 111 01/24/2021 0853   HDL 40 01/24/2021 0853   CHOLHDL 4.9 01/24/2021 0853   VLDL 19 12/29/2016 1146   LDLCALC 135 (H) 01/24/2021 0853    Lab Results  Component Value Date   MICRALBCREAT 2 05/14/2020    Assessment: DM Management / education- TIR is at goal > 70%. No Hypoglycemia. Patient remains having CGM issues and trusting CGM BG readings. He claims he has to disconnect pump frequently, however, is in control IQ mode 95% of the time. Patient would prefer to continue wearing CGM and using control IQ mode with tandem pump. Will reach out to [name], RD, CDCES, ADEPT (senior clinical Scientist, water quality at [facility]) to contact [name] regarding issues with CGM. Continue wearing Dexcom G6 CGM CGM. Follow up joint with Dr. Fransico Michael.  Plan: Continue insulin pump settings  Monitoring:  Continue wearing Continue Dexcom G6 CGM [name] has a diagnosis of diabetes, checks blood glucose readings > 4x per day, wears an insulin pump, and requires frequent adjustments  to insulin regimen. This patient will be seen every six months, minimally, to assess adherence to their CGM regimen and diabetes treatment plan. Follow Up: joint with Dr. Fransico Michael  This appointment required 90 minutes of patient care (this includes precharting, chart review, review of results, face-to-face care, etc.).  Thank you for involving clinical pharmacist/diabetes educator to assist in providing this patient's care.  Zachery Conch, PharmD, BCACP, CDCES, CPP

## 2021-05-18 ENCOUNTER — Other Ambulatory Visit: Payer: Self-pay | Admitting: Neurology

## 2021-05-20 ENCOUNTER — Other Ambulatory Visit (INDEPENDENT_AMBULATORY_CARE_PROVIDER_SITE_OTHER): Payer: 59 | Admitting: Pharmacist

## 2021-05-20 NOTE — Progress Notes (Signed)
Subjective:  Patient Name: [name] Date of Birth: 1981-01-12  MRN: [id number]  [name]  presents at his clinic visit today for follow-up of his type 1 diabetes mellitus, goiter, obesity, combined hyperlipidemia, adult ADHD, fatigue, autonomic neuropathy, tachycardia, hypertension, microalbuminuria, hypoglycemia, sinusitis, bronchitis, allergies, proliferative retinopathy, pseudotumor cerebri/increased intracranial hypertension, and unintentional weight loss.[name]   HISTORY OF PRESENT ILLNESS:   [name] is a 40 y.o. Caucasian gentleman. [name] was unaccompanied.  1. The patient was first referred to me on 01/08/2006 by his family physician, [name], for evaluation and management of type 1 diabetes mellitus and related problems. The patient was 40 years old.  A. [name] had been diagnosed with type 1 diabetes somewhere in 2001-2002, at the age of 37-19. He was initially diagnosed with type 2 diabetes mellitus, but was later re-classified as type 1 diabetes mellitus. He had been followed by [name], staff endocrinologist at [name]. [name] was started on an insulin pump approximately 18 months prior to first seeing me. His pump was a Medtronic Paradigm 712. His blood glucose control was fair-to-poor. He was frequently not checking blood sugars or taking insulin boluses as often as he needed to. He frequently noted fast heart rate. The patient's past medical history was positive for hypertension, for which he was taking lisinopril, and for what his mother called "extreme ADHD". He had previously been taking ADHD medicines but had discontinued them due to adverse effects. He had prior ankle injuries and knee injuries, to include tears of the lateral menisci bilaterally. He had not had any surgeries. 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 As a result, the patient was on the road a lot and spent  much of his time outdoors at field sites. He did not use tobacco or drugs, but did drink alcohol occasionally.  B. On physical examination, his weight was 234 pounds, his height was 70 inches, his BMI was 33.2. Blood pressure was 128/70. Hemoglobin A1c was 9.5%. Heart rate was 96. He had normal affect and fair insight. He had a 25-30 gram goiter. He had normal 1+ DP pulses in his feet. He had normal sensation in his feet to touch, vibration, and monofilament. His CMP was normal except for glucose of 251. His cholesterol was 257, triglycerides 143, HDL 49, and LDL 179. TSH was 1.017, free T4 was 1.06, and free T3 was 3.3. His urinary microalbumin: creatinine ratio was 20.1 (normal less than 30).   C. The patient clearly needed better blood glucose control, which would only occur if he had better adherence to his plan. He appeared to have autonomic neuropathy, manifested by inappropriate sinus tachycardia.  His thyroid goiter suggested that he might have evolving Hashimoto's disease. He stated that he had lost some weight recently. I encouraged him to check his blood sugars more frequently and to take both correction boluses and food boluses.  2. During the past fifteen years, [name] has had to deal with many different problems:  A. T1DM/hypoglycemia:    1). 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 His hemoglobin A1c values had varied from 8.3-11.2%.   2). After starting him on a Medtronic 670G insulin pump and Guardian 3 sensor on 01/12/17, his BG control has improved significantly, despite the illnesses  described below.   B. DM complications: .   1). He has bilateral diabetic retinopathy and is followed by his retinologist, Dr. Deloria Lair, MD.     A). He saw [name] in October 2020 and again in February 2021. There were no new signs of diabetic  eye disease.     2). At [name] visit with me in July 2021 he asked me to arrange a second opinion retinal consultation for him. I arranged for him to see [name] in the [name].    2). He has autonomic neuropathy manifested by inappropriate sinus tachycardia and gastroparesis causing postprandial bloating and GERD. These problems have varied inversely with his degree of BG control.   C. Combined hyperlipidemia:     1). On 12/03/10 his total cholesterol was 270, triglycerides 94, HDL 50, and LDL 201. I started him on Crestor, 10 mg per day. He has since begun treatment with atorvastatin and his cholesterol values had improved.    2). On 12/29/16 his lipids had improved, but his cholesterol values were still elevated. After developing the illnesses noted below, his atorvastatin was discontinued due to concerns that he might be having adverse reactions to the atorvastatin.    D. Allergies, sinusitis, bronchitis, headaches, muscle pains/spasms:   1). Unfortunately, beginning in about June 2018, [name] began to develop recurrent episodes of allergic rhinitis, head fullness, sinusitis, and bronchitis. He also developed headaches, muscle pains and spasms, lethargy, fatigue, and weakness that were presumably due in large part to severe allergies. His atorvastatin and several other medications were stopped at that time. Despite receiving excellent care from his allergist, to include allergy injections and biologic injections, [name] allergic issues persisted.    2). He saw [name] on 03/15/19 and again in February 2021. [name] allergies and asthma have been okay since has been wearing his covid mask. He has not needed steroids recently. His second sleep study was not bad enough to start on C-pap. In the past 8-10 months these illnesses/problems have largely resolved, in part due to taking a monthly biologic allergy injection.   E. Increased intracranial pressure:    1). [name] was seen in consultation by Dr.  Larey [name], neurology, on 05/28/17 for headaches, tremors of his right hand and foot, shaky vision, and symptoms c/w OSA. His initial sleep study did not show any significant amount of OSA.    2). On 03/08/18, JosephDohmeier was notified that [place] eye doctor had seen signs of fluid build up in his right eye. An LP was performed in radiology on 03/09/18. Opening pressure was 28 cm of water, c/w increased intracranial pressure and pseudotumor cerebri. This problem was initially though to be due to tetracycline usage, so TCN was discontinued and Diamox begun. The Diamox doses have been changed over time.    3). On 01/03/19 he had an LP that showed elevated pressure. His diamox dose was increased somewhat. Dr. Brett Fairy also started him on Topamax. He had a follow up visit with Dr. Brett Fairy on 02/23/20. She continued him on low doses of both medications. He still has some adverse effects of the Diamox, but not as bad. He still has some mild fatigue.  G. Adult ADD/depression: [name] was evaluated by Dr Carlena Hurl, a noted neuropsychiatrist in [place] in 2018. At his last follow up visit in November 2018, Dr. Wylene Simmer decided to wait until [name] overall health situation  stabilized before trying any new medications.   H. General physical and emotional health: [name]  has been very sick for much of the past 3 years. He has been unable to work full-time and has become almost confined to his home. He has become increasingly anhedonic over time.   I. Neck pains, decreased cervical range of motion, and shoulder girdle pains:   1). Several months ago he began to develop pains in his left upper chest and shoulder area. He saw Dr. Oneida Alar. The pains then went into his right upper chest and shoulder, then into his shoulder blades. Now he can't bend his neck. [place] of his left shoulder was negative.    2). He missed his follow up appointment with Dr. Oneida Alar due to other conflicting medical appointments. I asked him to  re-schedule that appointment.  J. He had an MRI and an MRA of his brain. The MRI of the brain and brainstem down to C3 was normal. The MRA showed the following:  Moderate stenosis of the M1 segment of the left middle cerebral artery.  Negligible flow through the A1 segment of the left anterior cerebral artery.  This could be due to a hypoplastic/aplastic segment, a normal variant.  This could also be due to severe stenosis.  The left A2 segment derived this flow from the right to through the anterior communicating artery a "moderate stenosis of the M1 segment of the left middle cerebral artery: He then had an angiography procedure to evaluate his bilateral carotid  And innominate arterial circulation. That study showed: Approximately 65-70% stenosis of the left internal carotid artery supraclinoid segment secondary to a mildly irregular intracranial arteriosclerotic plaque. No opacification of the left anterior cerebral A1 segment most likely represents a pathologic intracranial occlusion. 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 Approximately 10 mm x 8.3 mm pyramidal configured high-riding jugular bulb. [name] was told to" take 325 mg of ASA daily as a secondary stroke prevention strategy".  K. Following the angiography procedure he developed bilateral palmar hand numbness. Fortunately, these symptoms are improving   L. He has an asthma consultation with Ms Juliann Pares, [place], on 07/24/20. He had mild restrictive disease, essentially unchanged.  M. He had an evaluation by Dr. Celine Ahr, [name], on 06/19/20 and a follow up visit on 10.01/21. The cardiologist recommended continuing his  ASA and statin therapy, but did not feel any cardiology follow up was needed.   Doristine Counter is followed by [name] for Idiopathic Intracranial Hypertension (IIH). He  saw his neurologist at [name] on 10/15/20. MRA of the brain did not add any additional info. [name] had a follow up visit on 01/03/21.  O. On 01/30/21, I saw him as an urgent add-on because Dr. Karlton Lemon saw [name] in the [facility] that day and thought that [name] looked bad and needed to be admitted. When I saw [name] I agreed. I contacted the Internal Medicine Teaching Service and had [place] admitted for evaluation and management.    1). During that admission from 01/30/21-02/02/21, [name] was thoroughly evaluated. TSH was 1.23, free T4 1.1, free T3 3.6; 25-OH vitamin D was 51.    2). To rule out autoimmune adrenal insufficiency (Addison's disease), two sets of ACTH and cortisol values were performed. On 01/30/21 at 1:56 PM, his ACTH was 23 (ref 6-50 in the AM) and his cortisol was 16.5 (ref 4-22 at 8 AM, 3-17 at 4 PM). On 01/31/21 at  7:24 AM his ACTH was 29.9 (ref 7.2-63.3 in the AM) and his cortisol was 6.5 (ref 6.7-22.6 in the AM). Although the ACTH values were well within normal limits, which would tend to exclude adrenal cortical insufficiency, he had one normal cortisol and one low cortisol. To further evaluate his hypothalamic-pituitary-adrenal (HPA) axis, an ACTH stimulation test was performed on 02/01/21 at 8:21 AM. Baseline cortisol was normal at 8.9. +30 minute cortisol was normal at 21.6. +60 minute cortisol was normal at 24.6. This very brisk and normal response ruled out adrenal insufficiency.    3). To rule out celiac disease, a tissue transglutaminase IgA and IgA were drawn. The tTG IgA was normal at <1.0. The IgA was normal at 136 (ref 47-310). Dr. Ardis Hughs also performed an endoscopy on 01/28/21 and took biopsies of the small stomach and small intestine. The gastric biopsies were negative for H. Pylori and the small intestinal biopsies were negative for celiac disease.                   4). To rule out an extrapancreatic insulinoma, a C-peptide was obtained on 01/30/21. The value was very low at <0.1  (ref 1.1-4.4), c/w his autoimmune T1DM.              5). To rule out a pancreatic tumor, an abdominal MRI was performed with and without contrast on 01/31/21. The pancreas was "moderately thin/atrophic", similar to a CT scan performed on 03/10/18. "There was no abnormal pancreatic parenchymal enhancement.   6). To rule out exocrine pancreas insufficiency, three tests were performed on 01/31/21.  The serum lipase was normal at 22 (ref 11-51). The serum amylase was low at 23 (ref 28-100). The stool pancreatic elastase was markedly low at 76 (ref >200). The low serum amylase and the very low stool elastase confirmed the clinical suspicion of pancreatic exocrine deficiency. Unfortunately, we do not know the cause of this exocrine insufficiency.    7). [name] was started on Creon and discharged on 02/02/21. His initial Creon dose was one 36,000 unit capsule by mouth with meals.   P. On 02/12/21 he had a CT/MRA scan of his head and neck. The brain was normal. He had mild soft plaque at the ICA bulb. No stenosis was seen. He had a 50% stenosis of the left supraclinoid ICA. There was no other intracranial large of medium vessel occlusion of correctable proximal stenosis. No patent A1 segment was seen on the left. Both anterior cerebral arteries receive their blood supply from the right carotid circulation.   Q. He met with Dr. Ardis Hughs in GI on 02/26/2021. Dr. Ardis Hughs increased the Creon doses to 3 with meals and 2 with snacks. Dr. Ardis Hughs referred [name] to [name] for assistance in dietary management of his pancreatic insufficiency, .   3. [name] last [facility] visit was on 04/09/2021. At that visit we continued his insulin pump settings.   A. In the interim, [name] had been feeling "ups and downs".  1). He can have a week or so of good BGs and then problems for days to weeks. He still has problems finding good sites.  2). He has still been having a lot of trouble with his Dexcom not working properly.  A.  When he puts in new transmitters he can have variable responses. Sometimes the SGs and BGs correlate well from the start of the transmitter, perhaps about 33% of the time. The majority of times the SGs never do correlate well.  B. He is frequently having to replace the transmitters after 12-24 hours. In the past month he has used 12 transmitters because 7-8 did not work well.  C. He only uses auto mode when he feels that the SG numbers are leveling out and can be trusted. If the Dexcom seems to work on the second day he will start auto mode.  D. He now keeps the control IQ on most of the time, especially at night. However, he still gets up every two-three hours during the night to be sure his BGs are okay.  E. He still uses the [name], but also has problems with the SGs. Not always correlating. The Elenor Legato seems to be working well for about the first half of its life, but later the SGs become progressively lower than his BGs. The Elenor Legato works better on his abdomen than on his arms.  B. Physically he is "a bit tired and sore". He feels "roughly about the same". He can now walk 10-20 minutes on the treadmill, but then he has leg pains the next day. He has both muscle cramps and joint pains. Although he has not been paying very good attention to how he feels,  he thinks his energy level and activity level are about the same. He is still pretty much house bound. He still feels worn out emotionally, some days better than others. He is getting longer periods of sleep at times when he thinks the Dexcom is working well.  C. He will continue to see his neurologist at [facility]. His last visit occurred on 04/27/21. His HAs were better. He was re-started on ASA.  D. He also saw the neuro-ophthalmologist that day. There were some signs of DM damage. The pressures were good.  E. He scraped his right shin about two weeks ago. He is now on antibiotics. Cephalexin, 500 mg, 4 times daily. The skin is slowly healing. F. He is  able to eat more now without discomfort. He is now re-gaining weight. He still  takes three Creon capsules with each meal and 1-2 capsules for snacks. The time for the SGs to rise postprandially can vary. He is not having much abdominal bloating. He is not having diarrhea. He does not usually have abdominal cramps.  G. He saw GI at [name] about a week ago. The doctor told [name] he has the worst case of pancreatic insufficiency he'd ever seen.  HChester [name] was previously found to have vitamin D level of 10. He is now taking 50,000 IU/week. He also takes a MVI daily.   I. He continues to see his therapist, Ms. [facility], every week. He is using alprazolam, 0.25 mg at bedtime when needed for anxiety. His PCP will prescribe the medication for him.  He thinks the medication and the therapy are helping him.   J. He takes losartan, 50 mg/day. He rarely has any coughing unless his allergies act up. He also takes atorvastatin, 20 mg/day.    K. His insurance plan will not allow him to have any more than 200 BG test strips per month.    4. Pertinent Review of Systems: As above   5. Pump printout: He changes sites every 1-3 days, usually every day. Average BG is 200, compared with 167 at his last visit. BG range is 108-256, compared with 150-191 at his last visit. .  6. Dexcom CGM printout: We have data for the past week. Average SG is 165, compared with 172 at his last visit and  with 161 at his last prior. SG range is 114-270. Time in Range is 76%, compared with 69% at the last visit and with 76% at his prior visit. Time above range is 23%, compared with 35% at his last visit and 24% at the prior visit.   PAST MEDICAL, FAMILY, AND SOCIAL HISTORY:  Past Medical History:  Diagnosis Date   ADHD (attention deficit hyperactivity disorder)    Anxiety    Asthma    Autonomic neuropathy due to diabetes (HCC)    Chronic headaches    Diabetes (HCC)    Fatigue    GERD (gastroesophageal reflux disease)    Goiter     High blood pressure    High cholesterol    Hypercholesterolemia    Hypertension    Hypoglycemia associated with diabetes (HCC)    Malnutrition (HCC)    Obesity    Sleep apnea    mild, no cpap   Tachycardia    Type 1 diabetes mellitus not at goal [facility])    Uncontrolled DM with microalbuminuria or microproteinuria     Family History  Problem Relation Age of Onset   Dementia Paternal Aunt    Schizophrenia Paternal Aunt    Cancer Maternal Grandmother        type unknown   Diabetes Maternal Grandmother        T2 DM   Cancer Maternal Grandfather        type unknown   Irritable bowel syndrome Mother    Heart disease Father    Hypertension Father    Hyperlipidemia Father    Skin cancer Father    Thyroid disease Sister    Stomach cancer Paternal Aunt    Pancreatic cancer Paternal Aunt    Diabetes Maternal Aunt    Allergic rhinitis Neg Hx    Angioedema Neg Hx    Asthma Neg Hx    Atopy Neg Hx    Eczema Neg Hx    Immunodeficiency Neg Hx    Urticaria Neg Hx    Esophageal cancer Neg Hx    Colon cancer Neg Hx    Colon polyps Neg Hx      Current Outpatient Medications:    acetaZOLAMIDE (DIAMOX) 250 MG tablet, Take 250 mg by mouth 2 (two) times daily., Disp: , Rfl:    albuterol (PROAIR HFA) 108 (90 Base) MCG/ACT inhaler, Inhale 2 puffs into the lungs every 6 (six) hours as needed for wheezing or shortness of breath., Disp: 1 Inhaler, Rfl: 2   albuterol (PROVENTIL) (2.5 MG/3ML) 0.083% nebulizer solution, Take 3 mLs (2.5 mg total) by nebulization every 4 (four) hours as needed for wheezing or shortness of breath., Disp: 75 mL, Rfl: 1   ALPRAZolam (XANAX) 0.25 MG tablet, Take 0.25 mg by mouth at bedtime., Disp: , Rfl:    aspirin EC 81 MG tablet, Take 162 mg by mouth daily as needed for moderate pain. Swallow whole., Disp: , Rfl:    atorvastatin (LIPITOR) 20 MG tablet, Take 20 mg by mouth daily. , Disp: , Rfl: 0   AUVI-Q 0.3 MG/0.3ML SOAJ injection, Use as directed for life-threatening  allergic reaction., Disp: 2 each, Rfl: 2   budesonide (PULMICORT) 0.5 MG/2ML nebulizer solution, Take 2 mLs (0.5 mg total) by nebulization as directed., Disp: 2 mL, Rfl: 3   budesonide-formoterol (SYMBICORT) 160-4.5 MCG/ACT inhaler, Inhale 2 puffs into the lungs 2 (two) times daily., Disp: 1 Inhaler, Rfl: 5   calcium carbonate (OSCAL) 1500 (600 Ca) MG TABS tablet, Take 600  mg of elemental calcium by mouth once a week., Disp: , Rfl:    cephALEXin (KEFLEX) 500 MG capsule, Take 500 mg by mouth 4 (four) times daily., Disp: , Rfl:    Continuous Blood Gluc Receiver ([place]) DEVI, 1 Device by Does not apply route as directed., Disp: 1 each, Rfl: 2   Continuous Blood Gluc Sensor (DEXCOM G6 SENSOR) MISC, Change sensor every 10 days, Disp: 3 each, Rfl: 11   Continuous Blood Gluc Sensor (FREESTYLE LIBRE 2 SENSOR) MISC, Inject 1 Device into the skin every 14 (fourteen) days. Place prescription on hold. To use if Dexcom CGM fails., Disp: 2 each, Rfl: 11   Continuous Blood Gluc Transmit (DEXCOM G6 TRANSMITTER) MISC, Change every 90 days, Disp: 1 each, Rfl: 3   Glucagon (BAQSIMI TWO PACK) 3 MG/DOSE POWD, Place 1 spray into the nose as directed., Disp: 2 each, Rfl: 3   glucose blood test strip, Use to check blood sugar up to 10x daily, Disp: 300 each, Rfl: 11   glucose blood test strip, Use to check blood sugar 6 x per day, Disp: 200 each, Rfl: 5   HUMALOG 100 UNIT/ML injection, Use 300 units in insulin pump every 48 hours, Disp: 120 mL, Rfl: 1   insulin lispro (INSULIN LISPRO) 100 UNIT/ML KwikPen Junior, Inject up to 50 units daily per provider instructions, Disp: 15 mL, Rfl: 11   Insulin Pen Needle (PEN NEEDLES) 32G X 4 MM MISC, Use to inject insulin 6x per day, Disp: 200 each, Rfl: 5   levocetirizine (XYZAL) 5 MG tablet, Take 1 tablet (5 mg total) by mouth daily as needed for allergies., Disp: 30 tablet, Rfl: 5   lipase/protease/amylase (CREON) 36000 UNITS CPEP capsule, 3 pills with every meal and 2  pills with every snack, Disp: 390 capsule, Rfl: 11   losartan (COZAAR) 50 MG tablet, TAKE 1 TABLET(50 MG) BY MOUTH DAILY, Disp: 90 tablet, Rfl: 1   Mometasone Furoate (ASMANEX HFA) 200 MCG/ACT AERO, For asthma flare, begin Asmanex 200-2 puffs twice a day for 1-2 weeks or until cough and wheeze free, Disp: 13 g, Rfl: 5   montelukast (SINGULAIR) 10 MG tablet, TAKE 1 TABLET BY MOUTH EVERY NIGHT AT BEDTIME, Disp: 30 tablet, Rfl: 5   Multiple Vitamin (MULTIVITAMIN WITH MINERALS) TABS tablet, Take 1 tablet by mouth daily., Disp: , Rfl:    NUCALA 100 MG/ML SOAJ, INJECT 100MG SUBCUTANEOUSLY EVERY 4 WEEKS (GIVEN AT MD  OFFICE), Disp: 1 mL, Rfl: 11   omeprazole (PRILOSEC) 20 MG capsule, Take 1 capsule (20 mg total) by mouth daily., Disp: 30 capsule, Rfl: 11   topiramate (TOPAMAX) 50 MG tablet, TAKE 1 TABLET(50 MG) BY MOUTH TWICE DAILY, Disp: 180 tablet, Rfl: 0   Vitamin D, Ergocalciferol, (DRISDOL) 1.25 MG (50000 UNIT) CAPS capsule, Take 50,000 Units by mouth every 7 (seven) days., Disp: , Rfl:   Current Facility-Administered Medications:    Mepolizumab SOLR 100 mg, 100 mg, Subcutaneous, Q28 days, Valentina Shaggy, MD, 100 mg at 02/16/19 1018  Facility-Administered Medications Ordered in Other Visits:    gadopentetate dimeglumine (MAGNEVIST) injection 20 mL, 20 mL, Intravenous, Once PRN, Dohmeier, Asencion Partridge, MD  Allergies as of 05/21/2021 - Review Complete 05/21/2021  Allergen Reaction Noted   Flonase [fluticasone] Shortness Of Breath 06/10/2017   Sulfa antibiotics  12/10/2020   Molds & smuts Other (See Comments) 03/09/2018   Sulfites Itching and Swelling 02/04/2018   Tetracyclines & related Other (See Comments) 03/09/2018   Lactose intolerance (gi) Other (See  Comments) 01/05/2021   White birch Cough 04/10/2020    1. Work and Family: He can no longer work part-time at home. 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  2. Activities: He walks every day for short distances,  but is in pain.  3. Smoking, alcohol, or drugs: He has not been drinking any alcohol or beer. No tobacco or drugs. 4. Primary Care Provider: His PCP is Ms Mattie Marlin, PA, [facility].  5. Neurology: Dr. Brett Fairy was his neurologist, but now he goes to [name].  6. Ophthalmology:  7. Retinology: [name] 8. Urology: Dr. Jeffie Pollock at [facility] 9. Sports medicine: Dr. Oneida Alar and Dr. Barbaraann Barthel 10. Therapy: Ms [employer], RN, MSN  REVIEW OF SYSTEMS: There are no other significant problems involving Sukhraj's other body systems.   Objective:  Vital Signs:  BP 116/74 (BP Location: Left Arm, Patient Position: Sitting, Cuff Size: Normal)   Pulse 98   Wt 206 lb (93.4 kg)   BMI 27.94 kg/m  BP 112/82   Ht Readings from Last 3 Encounters:  05/13/21 6' (1.829 m)  05/08/21 6' (1.829 m)  04/08/21 6' (1.829 m)   Wt Readings from Last 3 Encounters:  05/21/21 205 lb 14.6 oz (93.4 kg)  05/21/21 206 lb (93.4 kg)  05/13/21 207 lb (93.9 kg)    PHYSICAL EXAM:  Constitutional: The patient appears overweight, still somewhat tired, but better. His weight is 2 pounds lower than at his  last visit. He looks and presents much better than he feels. His affect is better. The volume and intensity of his speech have increased.   He looks better, in that he is more engaged and does not appear as weak as he did. His insight is fair. He continues to improve.   I deferred the remainder of the exam to day in order to concentrate on his issues related to his Dexcom and insulin pump.  LAB DATA:   Labs 05/21/21: CBG 206  Labs 04/09/21: CBG 209  Labs 03/12/21: CBG 169  Labs 02/27/21: CBG 165  Labs 01/30/21: HbA1c 6.3%, CBG 163; Urine glucose negative, ketones negative  Labs 12/25/20: CBG 199   Labs 2/18-2/19/22: CBG 285; CBC normal; U/A >500 glucose, negative ketones; BMP normal, except CO2 20 and glucose 294; CK 77 (ref 49-397); hepatic panel normal  Labs 12/05/20: HbA1c 7.5%,  CBG 165; BHOB  0.2 (ref 0.04-0.27); venous pH 7.284;   Labs 08/16/20: HbA1c 6.2%, CBG 215  Labs 05/14/20: HbA1c 6.5%, CBG 176; TSH 2.44, free T4 1.1, free T3 3.5; urinary microalbumin/creatinine ratio 2 (ref <30)  Labs 04/17/20: ESR 6 (ref 0-15), CK 82 (ref 49-439)  Labs 07/12/19: HbA1c 6.4%, CBG 231  Labs 04/26/19: HbA1c 6.8%, CBG 230  Labs 12/30/18: TSH 2.07, free T4 1.1, free T3 3.2, TPO antibody <1, thyroglobulin antibody <1; CMP normal, except glucose 191; cholesterol 165, triglycerides 138, HDL 33, LDL 106; urinary microalbumin/creatinine ratio 5; vitamin B1 10 (ref 8-30), vitamin B6 3.4 (ref 2.1-21.7), vitamin B12 451 (ref [phone])  Labs 11/29/18: CBG 179  Labs 11/29/18: CMP with glucose 198, chloride 109 (ref 96-106, CO2 17 (ref 20-29); CBC with WBC 11.6 (ref 3.4-10.8), PMNs 8.6 (ref 1.4-7.0), and monocytes 1.0 (ref 0.1-0.9)  Labs 10/28/18: HbA1c 6.9%, CBG 155  Labs 10/06/18: ANA titer negative, ANCA titers negative, rheumatoid factor negative, cyclic citrullin peptide 7 (ref 0-190  Labs 07/27/18: CBG 193  Labs 05/31/18: HbA1c 6.6%, CBG 202  Labs 03/18/18:  CBG 178  Labs 01/13/18: HbA1c 6.8%, CBG 194; TSH 0.98, free T4 1.3; free T3 3.6; CMP normal except glucose 191; urinary microalbumin/creatinine ratio 35 (ref <30)  Labs 11/17/17: CBG 170  Labs 10/13/17: HbA1c 6.8%, CBG 191  Labs 08/24/17: CBG 117  Labs 07/20/17: CBG 216  Labs 07/08/17: IgA, IgG, and IgM normal. CBC normal.  Labs 06/16/17: HbA1c 6.5%, CBG 193  Labs 05/15/17: CBG 209; TSH 0.90, free T4 1.2, free T3 3.8  Labs 03/05/17: HbA1c 7.3%, CBG 241  Labs 02/20/17: CBG 180 fasting  Labs 01/29/17: CBG 148  Labs 01/12/17: CBG 116  Labs 12/29/16: HbA1c 9.1%, CBG 223; TSH 0.62, free T4 1.1, free T3 3.5; urine microalbumin/creatinine ratio 22; cholesterol 171, triglycerides 93, HDL 40, LDL 112;  CMP glucose 201  Labs 09/26/16: HbA1c 9.5%  Labs 08/13/16: Celiac panel negative; CMP normal except for glucose 207; CBC  normal  Labs 06/13/16: HbA1c 9.8%; LH 3.6, FSH 2.3, testosterone 533 (ref 250-827), free testosterone 59.4 (ref 47-244)  Labs 02/11/16: HbA1c 9.2%  Labs 01/07/16: HbA1c 10.5%; CBC with Hgb elevated at 17.2%; LH 3.9, FSH 2.0; TSH 1.53, free T4 1.3, free T3 3.7; cholesterol 262, triglycerides 171, HDL 40, LDL 188; urinary microalbumin/creatinine ratio 31; CMP normal except for glucose 181  Labs 06/29/15. HbA1c 9.6%  Labs 12/28/14: Hemoglobin A1c 9.5% today, compared with 9.5%, at last visit and with 9.9% at the prior visit.  Labs 08/03/14: HbA1c 9.5%; TSH 0.548,free T4 1.10, free T3 3.7, TPO antibody < 1, TSI 30; CMP normal except glucose 191; urinary microalbumin/creatinine ratio 27; cholesterol 260, triglycerides 90, HDL 51, LDL 191   Labs 10/05/13: HbA1c 9.9%  Labs 04/23/13: CMP normal, except glucose 144; cholesterol 216, triglycerides 97, HDL 39, LDL 158; urinary microalbumin/creatinine ratio 6.9; TSH 0.505, free T4 1.17, free T3 3.8  Labs: 01/21/12: TSH 1.251, free T4 1.10, free T3 3.6, CBC normal, CMP normal except for glucose 206, urinary microalbumin/creatinine ratio 6.3, cholesterol 187, triglycerides 67, HDL 45, LDL 129   Assessment and Plan:   ASSESSMENT:  1. T1DM:   A. In 2021, his SGs had been mostly <200, reflecting fairly good DM control, but at the cost of severely curtailing his food intake. [name] had done very well at trying to optimize his BG control. This level of BG control without significant hypoglycemia in October 2021 was a testament to the power of using auto mode in insulin pump-CGM technology and to [name] determination to keep his BGs under control.  B. Unfortunately, thereafter he had many site problems and sensor problems. BGs and HbA1c were much higher in February 2022. His HbA1c increased to 7.5%%, compared to 6.2% in October 2021. He was also not having too many low BGs in October.  C. In early April 2022, however, the HbA1c had decreased to 6.3% and he was having  more lower BGs, despite severely reducing the amount of insulin he took daily. As noted above, he was diagnosed with pancreatic exocrine deficiency in April 2022.   D. Since starting and increasing Creon, he feels somewhat better. He actually looks and presents as being progressively better over time. Unfortunately, he is so focused on his blood sugar issues and the discrepancies between his Dexcom and his CBGs that he has difficulty realizing that he is improving clinically. He is very carefully slowly increasing his food intake.  2. Hypoglycemia: He has had a few low BGs this month. He is slowly increasing his carb intake.   3. Hypertension: His BP  is normal today.  4. Combined hyperlipidemia:   A. His lipids in March 2017 were too high, but the lipids in March 2018 were much better on atorvastatin. He discontinued the atorvastatin in mid-2018 due to concern that this medication might be causing his neuromuscular symptoms.   B. Ms Frederico Hamman re-started his atorvastatin several months ago. His lipids in March 2022 were worse. We will follow up on this issue after his pancreatic exocrine insufficiency stabilizes. 5. Autonomic neuropathy with tachycardia and gastroparesis: His gastroparesis has improved in parallel with his BG improvement. His heart rate has improved as well.  6. Obesity: Weight has decreased. He is trying to eat more.  7. Goiter:  A. His thyroid gland had shrunk back to normal size at a previous visit, but had increased slightly at two previous visits and remains slightly enlarged today. His thyroiditis is clinically quiescent.   B. The active thyroiditis that he has had in the past and the  process of waxing and waning of thyroid gland size are c/w evolving Hashimoto's thyroiditis.   C. He was euthyroid in March 2013, but was borderline hyperthyroid in June 2014. He was euthyroid in October 2015, March 2017, March 2018, July 2018, in March 2019, in March 2020, in July 2021, and in April  2022.  9. Fatigue: This problem is improving slowly, but progressively.  10. Obstructive sleep apnea: He did not meet the criteria for OSA, but because of his snoring a dental appliance was suggested. I recommended that he obtain a dental appliance. Unfortunately, his former dentist no longer accepts [employer], so [name] has to find a new dentist. 52. Adult ADD: He had an appointment with Dr. Carlena Hurl, MD in [place] in November 2018. Dr. Wylene Simmer is waiting for [name] medical situation to stabilize before beginning any new psych meds.   12. Proliferative diabetic retinopathy: As above.  13. URI/nasal congestion/vertigo/allergic rhinitis and conjunctivitis: He is having more of these problems today. The monthly injections had really helped him two years ago. He is coordinating with his allergist to re-start the injections. .   14. Neuromuscular symptoms and headaches/increased intracranial pressure:   A. It appeared previously that some of [name] headaches were classic tension headaches. When he performed cervical stretching exercises the headache resolved.   B. In the past when Dr. Brett Fairy evaluated [name] for his vague neuromuscular symptoms, she did not find any signs of any serious neurologic disease. His MRI of his brain was unremarkable. At that time his neuromuscular symptoms had resolved.  C. Later, however, the headaches worsened and some of the neuromuscular symptoms recurred. We now know that he had increased ICP and that his headaches improved for a brief time after the LP. He then developed a typical spinal tap headache.  D. In retrospect he may have had intermittent problems with elevated ICP previously.  E. Dr. Brett Fairy suspects that [name] ICP was due to the doxycycline.  F. He appeared to have problems taking his prior dose of Diamox, but is doing better on the combination of a lower dose of Diamox and Topamax.   G. He is being evaluated at [name] neurology now.  15.  Calcified lymph node: It is possible that he may have contracted a fungus while he was working in the [employer]. several years ago. His thyroid US showed his goiter, but no intrinsic thyroid abnormality. A partially calcified cervical lymph node on the right was noted, c/w a post infections/inflammatory node. 16. Microalbuminuria: His ratio  was mildly elevated in March 2019. He needed tighter BG and BP control. His ratio in March 2020 and again in July 2021 was very good.  17. Anhedonia: [name] is still depressed, in part because he is not improving as much and as rapidly as he wanted and because of his frustration with his Dexcom and T-Slim pump. He is tired because he is spending too much time at night to control his BGs.  He desperately wants to be normal. His sessions with Ms Rea [employer], RN, MSN, a psychiatric nurse specialist, have been very beneficial. He still needs to find more sources of fun.  18. Neck pain/Cervical neuritis/neuromuscular pain: [name] has seen Dr. Barbaraann Barthel who has ordered PT for [name]. [name] is doing better with PT and his stamina is improving.    19. Cerebral arteriosclerosis: It is unclear to me whether the lesions seen on angiography and MRA are amenable to vascular surgical techniques. 20. Vitamin D deficiency: His 25-OH vitamin D value in April 2022 was mid-normal.   PLAN:  1. Diagnostic: We reviewed his pump and Dexcom printouts today.  2. Therapeutic: He will see Dr. Barbaraann Barthel in Sports Medicine in follow up. He will also see PT and nutrition.  Dr. Lovena Le and I will see him in follow up on 06/14/21 at 12:30 PM. We will download his BG meters, his Dexcom, and his Freestyle Libre then and try to make sense of the data.  Continue current pump settings   Basal rates: basal 1 MN:0.99 6 AM: 0.975 2 PM: 1.35 8 PM: 1.30   ISFs: 35   ICRs: MN: 7 8 AM: 4.5 11 AM: 4.5 9 PM: 7   BG targets:  MN: 130-150 6 AM: 120 10 PM 130-150  3. Patient education: We discussed  all of the above at great length.  4. Follow up: 12;30 PM on 06/14/21.   Level of Service: This visit lasted in excess of 85 minutes. More than 50% of the visit was devoted to coordinating care and documenting this encounter.    Tillman Sers, MD, CDE Adult and Pediatric Endocrinology

## 2021-05-21 ENCOUNTER — Encounter (INDEPENDENT_AMBULATORY_CARE_PROVIDER_SITE_OTHER): Payer: Self-pay | Admitting: "Endocrinology

## 2021-05-21 ENCOUNTER — Other Ambulatory Visit: Payer: Self-pay

## 2021-05-21 ENCOUNTER — Ambulatory Visit (INDEPENDENT_AMBULATORY_CARE_PROVIDER_SITE_OTHER): Payer: 59 | Admitting: "Endocrinology

## 2021-05-21 ENCOUNTER — Ambulatory Visit (INDEPENDENT_AMBULATORY_CARE_PROVIDER_SITE_OTHER): Payer: 59 | Admitting: Pharmacist

## 2021-05-21 ENCOUNTER — Ambulatory Visit: Payer: 59 | Admitting: Dietician

## 2021-05-21 VITALS — BP 116/74 | Wt 205.9 lb

## 2021-05-21 VITALS — BP 116/74 | HR 98 | Wt 206.0 lb

## 2021-05-21 DIAGNOSIS — E10649 Type 1 diabetes mellitus with hypoglycemia without coma: Secondary | ICD-10-CM | POA: Diagnosis not present

## 2021-05-21 DIAGNOSIS — E1065 Type 1 diabetes mellitus with hyperglycemia: Secondary | ICD-10-CM | POA: Diagnosis not present

## 2021-05-21 DIAGNOSIS — E1069 Type 1 diabetes mellitus with other specified complication: Secondary | ICD-10-CM

## 2021-05-21 LAB — POCT GLUCOSE (DEVICE FOR HOME USE): POC Glucose: 206 mg/dl — AB (ref 70–99)

## 2021-05-21 MED ORDER — GLUCOSE BLOOD VI STRP: ORAL_STRIP | 6 refills | Status: DC

## 2021-05-21 NOTE — Patient Instructions (Addendum)
Follow up at 12;30 pm ON 06/14/21.

## 2021-05-24 ENCOUNTER — Ambulatory Visit: Payer: 59 | Admitting: Endocrinology

## 2021-06-01 ENCOUNTER — Other Ambulatory Visit: Payer: Self-pay | Admitting: Family Medicine

## 2021-06-06 ENCOUNTER — Other Ambulatory Visit: Payer: Self-pay

## 2021-06-06 MED ORDER — MONTELUKAST SODIUM 10 MG PO TABS: 10.0000 mg | ORAL_TABLET | Freq: Every day | ORAL | 0 refills | Status: DC

## 2021-06-10 ENCOUNTER — Other Ambulatory Visit: Payer: Self-pay

## 2021-06-10 MED ORDER — MONTELUKAST SODIUM 10 MG PO TABS: 10.0000 mg | ORAL_TABLET | Freq: Every day | ORAL | 0 refills | Status: DC

## 2021-06-12 ENCOUNTER — Encounter (INDEPENDENT_AMBULATORY_CARE_PROVIDER_SITE_OTHER): Payer: 59 | Admitting: Ophthalmology

## 2021-06-18 ENCOUNTER — Other Ambulatory Visit: Payer: Self-pay | Admitting: Neurology

## 2021-06-24 ENCOUNTER — Other Ambulatory Visit: Payer: Self-pay

## 2021-06-24 ENCOUNTER — Ambulatory Visit (INDEPENDENT_AMBULATORY_CARE_PROVIDER_SITE_OTHER): Payer: 59 | Admitting: Family Medicine

## 2021-06-24 VITALS — Ht 72.0 in | Wt 205.0 lb

## 2021-06-24 DIAGNOSIS — M791 Myalgia, unspecified site: Secondary | ICD-10-CM

## 2021-06-24 NOTE — Patient Instructions (Signed)
Continue the cardio you've been doing and don't advance it at this time. Continue the exercises with play-doh. Add bicep curls, elbow overhead extensions with 1 pound weight - work up to doing 3 sets of 10 every other day (start at 1 set of 10 though). Quad sets and straight leg raises will help with the knee pain - do these also every other day.  - can add ankle weight to these (2 pounds) if they become too easy. Follow up in about 6-8 weeks.

## 2021-06-25 NOTE — Progress Notes (Signed)
[name] - 40 y.o. male MRN [id number]  Date of birth: 1981-07-18  SUBJECTIVE:    CC: Joint Pain  3/9: Patient reports over the past 2 months he has felt increasingly weak. Primary complaint today is feeling like his feet and hands are 'down to the bone' and it feels uncomfortable when holding the steering wheel, walking. Feels at times like he cannot hold his body weight. Of note he's had difficulty with his insulin pump in getting sites to work (better past 5 days). Relatedly he's lost about 10+ pounds over the past 1-2 months. Reports large caloric deficit taking in 800-900 calories a day over past couple months. Denies numbness or tingling in feet or hands. Microfilament testing has been normal per endo notes. He is also scheduled to see neurologist tomorrow. Has regular appointments with diabetes educator but not dietitian. Denies nausea or vomiting though appetite is not what it has been. Autonomic dysfunction noted in chart - asked him about gastroparesis and he reports history of this being mentioned remotely.   3/23: Patient reports he feels like he's worse than last visit. Biggest problem is recent stomach virus with nausea, diarrhea, abdominal pain - feels this has improved but was present for several days. Blood sugars not increasing with his carbohydrate intake - even had to turn off his basal rate because blood sugar was dropping. This morning it appears to be rising in response to diet, had no ketones this morning and basal is now back on. Stomach feels crampy. Has not seen GI in past. Started BRAT diet as recommended by Dr. Fransico [name]. Had initial visit to dietitian and doesn't follow up for about 3-4 more weeks - most discussion was general in the initial visit.   4/6: [name] comes in today very fatigued, run down. He reports he is not even running his insulin pump 90% of the time and maximum bolus he's given himself over more than a week was 1 unit of  insulin. Blood sugars still not responding to his dietary intake. He's consuming more than 2000 calories a day and has been tracking this regularly on an app. Eating variety of things but primarily eggs, sausage, bread, applesauce throughout the day. He is not sleeping because of concern about his blood sugars dropping overnight and not being surrounded by people who know how to take care of this if it were to happen. Takes more than an hour for blood sugar to stabilize when it starts to drop despite drinking sugar-containing beverages. He had EGD with Dr. Christella [name] after evaluation - some gastritis and biopsies sent to assess for celiac but otherwise normal test. Gastric emptying study was normal in 2019. Has a pancreatic MRI scheduled for Saturday. Reports he's not sure how long he can continue like this though denied suicidal ideation when asked directly and no plan.   5/9: Patient was admitted to the hospital after last visit and had extensive workup. Found to have exocrine pancreatic insufficiency. Some concern his frequent monitoring is causing increased distress leading to decreased sleep and anxiety. They had a discussion regarding switch from pump to basal + bolus but patient would like to continue with pump - we separately discussed this today as well. Other labwork including for celiac, thyroid dysfunction, adrenal insufficiency normal. No insulinoma on imaging. He's interested in a more specific dietary plan than he's received to date - Dr. Christella [name] looked into this for him and so far no luck. He continues to have site issues having to change  daily. Tried walking for exercise once - 30 minutes on treadmill and this caused quite a bit of joint pain subsequent days.  5/22: He has lost 2 pounds since last visit.  Started PT which has made him more fatigued. Reports not helping much yet.  Knee pain is bothering him the most recently. Feels like bone on bone because he has no muscle tone  to help support joints.  He is still having insulin pump and sensor problems.  He has to check his sugars every hour and dose as needed. He is even waking up hourly overnight.  Talked to a dietitian last week. His mood is down still. He does take some alprazolam at night.   7/18: Patient returns reporting improvements in his physical activity. He is able to stand for up to 30 minutes at a time now and walk on treadmill 10 minutes at a time. Has been doing some easy exercises at home but no longer going to PT. He is working with endocrinology on his CGM, site issues. He is going to see specialists in GI and endocrinology later this year at [facility]. Has notable severe exocrine pancreatic insufficiency in addition to his longstanding T1DM  8/29: Patient returns having seen [facility] Friday and having upper endoscopy - has appointment to get full results in about a week. Planning to see [facility] endocrinology too and meet with nutritionist for exocrine insufficiency at [facility]. He's not been able to advance his cardio - 10-15 minutes max on treadmill and cannot do this on back to back days. Doing strengthening exercises of hands, would like additional ones for elbow and knee pain.  Objective:  VS: BP:   HR: bpm  TEMP: ( )  RESP:   HT:6' (182.9 cm)   WT:205 lb (93 kg)  BMI:27.8 PHYSICAL EXAM:  Gen: NAD, comfortable in exam room  Bilateral knees: Mild atrophy.  No other gross deformity, ecchymoses, swelling. Mild TTP medial and lateral joint lines. FROM with normal strength. Negative ant/post drawers. Negative valgus/varus testing. Negative lachman. Negative mcmurrays, apleys. NV intact distally.  Bilateral elbows: No deformity. FROM with 5/5 strength. No tenderness to palpation. NVI distally.  ASSESSMENT & PLAN:  1. Hypoglycemia, Type 1 DM - Continue with current level of cardio. Add quad strengthening and bicep/tricep rehab.  Will follow with endo, GI.  F/u in 6-8 weeks.

## 2021-07-04 DIAGNOSIS — F411 Generalized anxiety disorder: Secondary | ICD-10-CM | POA: Diagnosis not present

## 2021-07-08 ENCOUNTER — Encounter (INDEPENDENT_AMBULATORY_CARE_PROVIDER_SITE_OTHER): Payer: 59 | Admitting: Ophthalmology

## 2021-07-08 DIAGNOSIS — F411 Generalized anxiety disorder: Secondary | ICD-10-CM | POA: Diagnosis not present

## 2021-07-10 ENCOUNTER — Ambulatory Visit (INDEPENDENT_AMBULATORY_CARE_PROVIDER_SITE_OTHER): Payer: BC Managed Care – PPO | Admitting: Ophthalmology

## 2021-07-10 ENCOUNTER — Other Ambulatory Visit: Payer: Self-pay

## 2021-07-10 ENCOUNTER — Encounter (INDEPENDENT_AMBULATORY_CARE_PROVIDER_SITE_OTHER): Payer: Self-pay | Admitting: Ophthalmology

## 2021-07-10 DIAGNOSIS — H2513 Age-related nuclear cataract, bilateral: Secondary | ICD-10-CM | POA: Diagnosis not present

## 2021-07-10 DIAGNOSIS — E103513 Type 1 diabetes mellitus with proliferative diabetic retinopathy with macular edema, bilateral: Secondary | ICD-10-CM

## 2021-07-10 DIAGNOSIS — E103551 Type 1 diabetes mellitus with stable proliferative diabetic retinopathy, right eye: Secondary | ICD-10-CM

## 2021-07-10 DIAGNOSIS — H43822 Vitreomacular adhesion, left eye: Secondary | ICD-10-CM

## 2021-07-10 DIAGNOSIS — E103552 Type 1 diabetes mellitus with stable proliferative diabetic retinopathy, left eye: Secondary | ICD-10-CM

## 2021-07-10 DIAGNOSIS — E103512 Type 1 diabetes mellitus with proliferative diabetic retinopathy with macular edema, left eye: Secondary | ICD-10-CM | POA: Diagnosis not present

## 2021-07-10 DIAGNOSIS — E103511 Type 1 diabetes mellitus with proliferative diabetic retinopathy with macular edema, right eye: Secondary | ICD-10-CM

## 2021-07-10 NOTE — Assessment & Plan Note (Signed)

## 2021-07-10 NOTE — Progress Notes (Signed)
07/10/2021     CHIEF COMPLAINT Patient presents for  Chief Complaint  Patient presents with   Retina Follow Up      HISTORY OF PRESENT ILLNESS: [name] is a 40 y.o. male who presents to the clinic today for:   HPI     Retina Follow Up   Patient presents with  Other.  This started 4.  Duration of 4.        Comments   4 mos fu ou oct. Pt states his vision is stable and unchanged. He states he has noticed new floaters in his left eye "a month or two ago." States they are not bothering him, "a couple whispy floaters."  Diabetes mellitus type 1, LBS: 107 A1C: " I think 6.5 or something"      Last edited by Nelva Nay on 07/10/2021  9:52 AM.      Referring physician: [name], PA-C [address] [place],  [place] [address]  HISTORICAL INFORMATION:   Selected notes from the MEDICAL RECORD NUMBER    Lab Results  Component Value Date   HGBA1C 6.3 (A) 01/30/2021     CURRENT MEDICATIONS: No current outpatient medications on file. (Ophthalmic Drugs)   No current facility-administered medications for this visit. (Ophthalmic Drugs)   Current Outpatient Medications (Other)  Medication Sig   acetaZOLAMIDE (DIAMOX) 250 MG tablet Take 250 mg by mouth 2 (two) times daily.   albuterol (PROAIR HFA) 108 (90 Base) MCG/ACT inhaler Inhale 2 puffs into the lungs every 6 (six) hours as needed for wheezing or shortness of breath.   albuterol (PROVENTIL) (2.5 MG/3ML) 0.083% nebulizer solution Take 3 mLs (2.5 mg total) by nebulization every 4 (four) hours as needed for wheezing or shortness of breath.   ALPRAZolam (XANAX) 0.25 MG tablet Take 0.25 mg by mouth at bedtime.   aspirin EC 81 MG tablet Take 162 mg by mouth daily as needed for moderate pain. Swallow whole.   atorvastatin (LIPITOR) 20 MG tablet Take 20 mg by mouth daily.    AUVI-Q 0.3 MG/0.3ML SOAJ injection Use as directed for life-threatening allergic reaction.   budesonide (PULMICORT) 0.5  MG/2ML nebulizer solution Take 2 mLs (0.5 mg total) by nebulization as directed.   budesonide-formoterol (SYMBICORT) 160-4.5 MCG/ACT inhaler Inhale 2 puffs into the lungs 2 (two) times daily.   calcium carbonate (OSCAL) 1500 (600 Ca) MG TABS tablet Take 600 mg of elemental calcium by mouth once a week.   cephALEXin (KEFLEX) 500 MG capsule Take 500 mg by mouth 4 (four) times daily.   Continuous Blood Gluc Receiver (DEXCOM G6 RECEIVER) DEVI 1 Device by Does not apply route as directed.   Continuous Blood Gluc Sensor (DEXCOM G6 SENSOR) MISC Change sensor every 10 days   Continuous Blood Gluc Sensor (FREESTYLE LIBRE 2 SENSOR) MISC Inject 1 Device into the skin every 14 (fourteen) days. Place prescription on hold. To use if Dexcom CGM fails.   Continuous Blood Gluc Transmit (DEXCOM G6 TRANSMITTER) MISC Change every 90 days   Glucagon (BAQSIMI TWO PACK) 3 MG/DOSE POWD Place 1 spray into the nose as directed.   glucose blood test strip Use to check blood sugar 8 x per day. Please fill for Onetouch Verio test strips.   HUMALOG 100 UNIT/ML injection Use 300 units in insulin pump every 48 hours   insulin lispro (INSULIN LISPRO) 100 UNIT/ML KwikPen Junior Inject up to 50 units daily per provider instructions   Insulin Pen Needle (PEN NEEDLES)  32G X 4 MM MISC Use to inject insulin 6x per day   levocetirizine (XYZAL) 5 MG tablet Take 1 tablet (5 mg total) by mouth daily as needed for allergies.   lipase/protease/amylase (CREON) 36000 UNITS CPEP capsule 3 pills with every meal and 2 pills with every snack   losartan (COZAAR) 50 MG tablet TAKE 1 TABLET(50 MG) BY MOUTH DAILY   Mometasone Furoate (ASMANEX HFA) 200 MCG/ACT AERO For asthma flare, begin Asmanex 200-2 puffs twice a day for 1-2 weeks or until cough and wheeze free   montelukast (SINGULAIR) 10 MG tablet Take 1 tablet (10 mg total) by mouth at bedtime.   Multiple Vitamin (MULTIVITAMIN WITH MINERALS) TABS tablet Take 1 tablet by mouth daily.   NUCALA 100  MG/ML SOAJ INJECT 100MG  SUBCUTANEOUSLY EVERY 4 WEEKS (GIVEN AT MD  OFFICE)   omeprazole (PRILOSEC) 20 MG capsule Take 1 capsule (20 mg total) by mouth daily.   topiramate (TOPAMAX) 50 MG tablet TAKE 1 TABLET(50 MG) BY MOUTH TWICE DAILY   Vitamin D, Ergocalciferol, (DRISDOL) 1.25 MG (50000 UNIT) CAPS capsule Take 50,000 Units by mouth every 7 (seven) days.   Current Facility-Administered Medications (Other)  Medication Route   Mepolizumab SOLR 100 mg Subcutaneous   Facility-Administered Medications Ordered in Other Visits (Other)  Medication Route   gadopentetate dimeglumine (MAGNEVIST) injection 20 mL Intravenous      REVIEW OF SYSTEMS:    ALLERGIES Allergies  Allergen Reactions   Flonase [Fluticasone] Shortness Of Breath   Sulfa Antibiotics     Shortness of breath, increased heart rate; "happened quite a while ago"   Molds & Smuts Other (See Comments)    Aggravate asthma   Sulfites Itching and Swelling   Tetracyclines & Related Other (See Comments)    increased intracranial pressure   Lactose Intolerance (Gi) Other (See Comments)    Upset stomach   White [place]ch Cough    PAST MEDICAL HISTORY Past Medical History:  Diagnosis Date   ADHD (attention deficit hyperactivity disorder)    Anxiety    Asthma    Autonomic neuropathy due to diabetes (HCC)    Chronic headaches    Diabetes (HCC)    Fatigue    GERD (gastroesophageal reflux disease)    Goiter    High blood pressure    High cholesterol    Hypercholesterolemia    Hypertension    Hypoglycemia associated with diabetes (HCC)    Malnutrition (HCC)    Obesity    Sleep apnea    mild, no cpap   Tachycardia    Type 1 diabetes mellitus not at goal [facility])    Uncontrolled DM with microalbuminuria or microproteinuria    Past Surgical History:  Procedure Laterality Date   EYE SURGERY Right 05/30/2020   Vitrectomy, Dr. 07/30/2020   EYE SURGERY  04/2020   IR 3D INDEPENDENT WKST  07/16/2020   IR ANGIO INTRA EXTRACRAN SEL  COM CAROTID INNOMINATE BILAT MOD SED  07/16/2020   IR ANGIO VERTEBRAL SEL SUBCLAVIAN INNOMINATE UNI L MOD SED  07/16/2020   IR ANGIO VERTEBRAL SEL VERTEBRAL UNI R MOD SED  07/16/2020   IR 07/18/2020 GUIDE VASC ACCESS RIGHT  07/16/2020   REFRACTIVE SURGERY     x9   WISDOM TOOTH EXTRACTION      FAMILY HISTORY Family History  Problem Relation Age of Onset   Dementia Paternal Aunt    Schizophrenia Paternal Aunt    Cancer Maternal Grandmother        type unknown  Diabetes Maternal Grandmother        T2 DM   Cancer Maternal Grandfather        type unknown   Irritable bowel syndrome Mother    Heart disease Father    Hypertension Father    Hyperlipidemia Father    Skin cancer Father    Thyroid disease Sister    Stomach cancer Paternal Aunt    Pancreatic cancer Paternal Aunt    Diabetes Maternal Aunt    Allergic rhinitis Neg Hx    Angioedema Neg Hx    Asthma Neg Hx    Atopy Neg Hx    Eczema Neg Hx    Immunodeficiency Neg Hx    Urticaria Neg Hx    Esophageal cancer Neg Hx    Colon cancer Neg Hx    Colon polyps Neg Hx     SOCIAL HISTORY Social History   Tobacco Use   Smoking status: Never   Smokeless tobacco: Never  Vaping Use   Vaping Use: Never used  Substance Use Topics   Alcohol use: No   Drug use: No         OPHTHALMIC EXAM:  Base Eye Exam     Visual Acuity (ETDRS)       Right Left   Dist cc 20/20 20/20 -1         Tonometry (Tonopen, 9:53 AM)       Right Left   Pressure 12 15         Pupils       Pupils Dark Light APD   Right PERRL 5 4 None   Left PERRL 5 4 None         Extraocular Movement       Right Left    Full Full         Neuro/Psych     Oriented x3: Yes   Mood/Affect: Normal         Dilation     Both eyes: 1.0% Mydriacyl, 2.5% Phenylephrine            Slit Lamp and Fundus Exam     External Exam       Right Left   External Normal Normal         Slit Lamp Exam       Right Left   Lids/Lashes Normal Normal    Conjunctiva/Sclera White and quiet White and quiet   Cornea Clear Clear   Anterior Chamber Deep and quiet Deep and quiet   Iris Round and reactive Round and reactive   Lens Trace Nuclear sclerosis Trace Nuclear sclerosis   Anterior Vitreous Normal Normal         Fundus Exam       Right Left   Posterior Vitreous avitric Normal, no posterior vitreous detachment   Disc Normal Normal, No edema, no heme   C/D Ratio 0.0 0.0   Macula Microaneurysms Microaneurysms, no clinically significant macular edema   Vessels PDR,  quiescent PDR-inactive , good laser photocoagulation and diathermized sites of prior neovascularization superior to the nerve   Periphery Clear media, good PRP PRP, no retinal holes or tears, attached            IMAGING AND PROCEDURES  Imaging and Procedures for 07/18/21  OCT, Retina - OU - Both Eyes       Right Eye Quality was good. Scan locations included subfoveal. Central Foveal Thickness: 334. Progression has been stable. Findings include normal foveal contour.   Left  Eye Quality was good. Scan locations included subfoveal. Central Foveal Thickness: 318. Progression has been stable. Findings include vitreomacular adhesion , normal foveal contour.   Notes No active maculopathy OU, VMA OS physiologic             ASSESSMENT/PLAN:  Nuclear sclerotic cataract of both eyes Mild OU no interval change  Stable treated proliferative diabetic retinopathy of left eye with macular edema determined by examination associated with type 1 diabetes mellitus (HCC) The nature of regressed proliferative diabetic retinopathy was discussed with the patient. The patient was advised to maintain good glucose, blood pressure, monitor kidney function and serum lipid control as advised by personal physician. Rare risk for reactivation of progression exist with untreated severe anemia, untreated renal failure, untreated heart failure, and smoking. Complete avoidance of  smoking was recommended. The chance of recurrent proliferative diabetic retinopathy was discussed as well as the chance of vitreous hemorrhage for which further treatments may be necessary.   Explained to the patient that the quiescent  proliferative diabetic retinopathy disease is unlikely to ever worsen.  Worsening factors would include however severe anemia, hypertension out-of-control or impending renal failure.  Stable treated proliferative diabetic retinopathy of right eye with macular edema determined by examination associated with type 1 diabetes mellitus (HCC) The nature of regressed proliferative diabetic retinopathy was discussed with the patient. The patient was advised to maintain good glucose, blood pressure, monitor kidney function and serum lipid control as advised by personal physician. Rare risk for reactivation of progression exist with untreated severe anemia, untreated renal failure, untreated heart failure, and smoking. Complete avoidance of smoking was recommended. The chance of recurrent proliferative diabetic retinopathy was discussed as well as the chance of vitreous hemorrhage for which further treatments may be necessary.   Explained to the patient that the quiescent  proliferative diabetic retinopathy disease is unlikely to ever worsen.  Worsening factors would include however severe anemia, hypertension out-of-control or impending renal failure.      ICD-10-CM   1. Vitreomacular adhesion of left eye  H43.822 OCT, Retina - OU - Both Eyes    2. Stable treated proliferative diabetic retinopathy of left eye with macular edema determined by examination associated with type 1 diabetes mellitus (HCC)  E10.3552 OCT, Retina - OU - Both Eyes   E10.3512     3. Stable treated proliferative diabetic retinopathy of right eye with macular edema determined by examination associated with type 1 diabetes mellitus (HCC)  E10.3551 OCT, Retina - OU - Both Eyes   E10.3511     4. Nuclear  sclerotic cataract of both eyes  H25.13       1.  OU with no sign of optic nerve edema associated with his prior intracerebral hypertension from pseudotumor cerebri  2.  OU with mild NSC  3.  OU with quiescent PDR  Ophthalmic Meds Ordered this visit:  No orders of the defined types were placed in this encounter.      Return in about 9 months (around 04/09/2022) for DILATE OU, COLOR FP, OCT.  There are no Patient Instructions on file for this visit.   Explained the diagnoses, plan, and follow up with the patient and they expressed understanding.  Patient expressed understanding of the importance of proper follow up care.   Alford Highland Janaiah Vetrano M.D. Diseases & Surgery of the Retina and Vitreous [facility] 07/18/21     Abbreviations: M myopia (nearsighted); A astigmatism; H hyperopia (farsighted); P presbyopia; Mrx spectacle prescription;  CTL contact lenses; OD right eye; OS left eye; OU both eyes  XT exotropia; ET esotropia; PEK punctate epithelial keratitis; PEE punctate epithelial erosions; DES dry eye syndrome; MGD meibomian gland dysfunction; ATs artificial tears; PFAT's preservative free artificial tears; NSC nuclear sclerotic cataract; PSC posterior subcapsular cataract; ERM epi-retinal membrane; PVD posterior vitreous detachment; RD retinal detachment; DM diabetes mellitus; DR diabetic retinopathy; NPDR non-proliferative diabetic retinopathy; PDR proliferative diabetic retinopathy; CSME clinically significant macular edema; DME diabetic macular edema; dbh dot blot hemorrhages; CWS cotton wool spot; POAG primary open angle glaucoma; C/D cup-to-disc ratio; HVF humphrey visual field; GVF goldmann visual field; OCT optical coherence tomography; IOP intraocular pressure; BRVO Branch retinal vein occlusion; CRVO central retinal vein occlusion; CRAO central retinal artery occlusion; BRAO branch retinal artery occlusion; RT retinal tear; SB scleral buckle; PPV pars plana  vitrectomy; VH Vitreous hemorrhage; PRP panretinal laser photocoagulation; IVK intravitreal kenalog; VMT vitreomacular traction; MH Macular hole;  NVD neovascularization of the disc; NVE neovascularization elsewhere; AREDS age related eye disease study; ARMD age related macular degeneration; POAG primary open angle glaucoma; EBMD epithelial/anterior basement membrane dystrophy; ACIOL anterior chamber intraocular lens; IOL intraocular lens; PCIOL posterior chamber intraocular lens; Phaco/IOL phacoemulsification with intraocular lens placement; PRK photorefractive keratectomy; LASIK laser assisted in situ keratomileusis; HTN hypertension; DM diabetes mellitus; COPD chronic obstructive pulmonary disease

## 2021-07-10 NOTE — Assessment & Plan Note (Signed)
Mild OU no interval change

## 2021-07-15 ENCOUNTER — Encounter: Payer: Self-pay | Admitting: Dietician

## 2021-07-15 ENCOUNTER — Encounter: Payer: BC Managed Care – PPO | Attending: Family Medicine | Admitting: Dietician

## 2021-07-15 ENCOUNTER — Other Ambulatory Visit: Payer: Self-pay

## 2021-07-15 DIAGNOSIS — E109 Type 1 diabetes mellitus without complications: Secondary | ICD-10-CM | POA: Insufficient documentation

## 2021-07-15 DIAGNOSIS — F411 Generalized anxiety disorder: Secondary | ICD-10-CM | POA: Diagnosis not present

## 2021-07-15 NOTE — Progress Notes (Signed)
Medical Nutrition Therapy  Appointment Start time:  1100             Appointment End time:  1130   Patient is here today alone.[name].   Joseph states that glucose "crashes" in the am once Joseph starts to move.   Still waking up every 2-3 hours to check glucose trend. Sleep 2-3 am to 10-11 am.   Joseph is usin g the Control IQ most of the time now. Joseph states his pump is set to decrease to goal of 110 and the pump works to get his blood glucose down then it does not stop going down without increased carb intake. Joseph takes 5 creon for a meal and 2-3 per snack (2 creon per 5 grams of fat) Joseph states that Joseph has continued to follow a high protein diet. Joseph is eating 2 meals and 1 snack per day.  Goal 5 small meals daily. His walks on the treadmill "when I can" 1 time daily fpr 10-15 minutes. Uses 1 lb weights and does 1 set of 10 reps. Feels increased fatigue and soreness with exercise.  Discussed need to nourish himself fully.  Joseph eats dinner at 7 pm.  Discussed possible snack or meal at 11 pm as Joseph is skipping lunch. Overall weight is stable. 1 low blood glucose in the past week.  Hypoglycemia unawareness.    Joseph saw our CDCES, Bonita Quin spring 2022 to problem solve dexcom issues.  Joseph states Joseph does not have confidence in the Dexcom readings and takes the pump off Control IQ at night and wakes every 2 hours to check his blood sugar due to this. - Joseph has not regained confidence in the Dexcom enough to use the Control IQ at night.   Referral diagnosis: pancreatic insufficiency, type 1 diabetes Preferred learning style: no preference indicated Learning readiness: ready, change in progress     NUTRITION ASSESSMENT    Anthropometrics  6' 206 lbs 07/15/2021 207 lbs [name] 205 lbs (decreased 4 lbs x 2 weeks) 208 lbs 03/12/21 and 03/18/2021 Feels best at 215 lbs UBW 230-240 lbs prior to pancrease issue     Clinical Medical Hx: Type 1 diabetes, pancreatic  insufficiency, diabetic retinopathy, neuropathy, gastroparesis  Medications: see list to include Creon, Humalog, vitamin D, MVI Labs: CBG -308 (Admitted to ER for hypoglycemia, 01/30/2021) CO2 - 21 (low)  Amylase - 23 (Low) Pancreatic Elastase - 76 (Low) Notable Signs/Symptoms: weight loss Nutrition Focused Physical Exam performed to obtain baseline.  Patient with decreased muscle at scapula/decreased strength.  Loss of fat on upper arm. Nails/eyes WNL.   T-Slim insulin pump:  basal rate 1.1 units per hour when not in Control IQ and bolus of 1 units /er 10 rams of carbohydrates.  Correction bolus is 1 unit per 50 points above 100. Dexcom CGM Joseph is hypoglycemia unaware until glucose of 50.   Joseph sees Dr Fransico Michael and started seeing Dr. Everardo All to have an adult endocrinologist option in the event that Dr. Fransico Michael is not available.       Lifestyle & Dietary Hx Patient lives with his parents due to his inability to walk after hospitalization in April.  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 Joseph states that his parents do not know the basics about diabetes or emergency situations related to diabetes.  Joseph would like for them to  have more information. No alcohol in the past 5 years.   Estimated daily fluid intake:  oz Supplements: MVI, vitamin D,  Sleep: has not slept through the night in the past 6 months due to diabetes monitoring/alarms Stress / self-care: due to chronic and acute illness.  Sees [facility], counselor weekly.  Fear of food due to hyperglycemia Current average weekly physical activity: Joseph started PT this week and is to go 1 hour twice per week.     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" First Meal (10-11 am) 2 eggs, 2 slice rye toast, [place] sausage or other meat, mustard, occasional avocado, occasional apple or berries Snack: none Second Meal: usually skips Snack:  occasional quest chips (19 grams protein per bag) or protein donut (low fat, 1 carb choice, plus protein), deli meat Third Meal ( 8 pm):  salmon, steak, or [place], salad without dressing, occasional rye bread OR tacos with hard corn shells, lettuce and tomatoes Snack: occasional quest chips or egg or beef sticks but often skips Beverages: water   Estimated Energy Needs Calories: 2200 Carbohydrate: 192g Protein: 115-130g     NUTRITION DIAGNOSIS  NB-1.1 Food and nutrition-related knowledge deficit As related to adequate nutrition related to health issues .  As evidenced by diet hx and patient report.     NUTRITION INTERVENTION  Nutrition education (E-1) on the following topics:  Concerns of poor quality/interrupted sleep   Discussed adding a meal or snack at 11:00 pm to help prevent low blood glucose.  There is a long time between dinner and bedtime Encourged variety in meals and more frequent meal/snack habits Continue Creon and current medication Continue to carbohydrate count Continue low fat diet  Carbohydrate with each meal and snack   Handouts Provided Include (previous visit) Meal plan card Hypoglycemia symptoms and treatments Diabetes resources   Learning Style & Readiness for Change Teaching method utilized: Visual & Auditory  Demonstrated degree of understanding via: Teach Back  Barriers to learning/adherence to lifestyle change: stress/fear   Goals Established by Pt Continue to aim for variety (non-starchy vegetables, whole grains, unprocessed starches, small portions fresh fruit for example). Each snack and meal should have protein and carbohydrates Protein shake such as Orgain  Orgain protein powder, water or almond milk, frozen berries, 1/2 frozen banana  Snack option  Peanut butter on apple or celery  Peanut butter on bread  Low sodium boar's head [place] or leftover meat, 1 slice bread, mustard  Chicken salad made with fat free mayo, low sodium  crackers  Leftovers  Add a snack or small meal at 11:00 pm     MONITORING & EVALUATION Dietary intake, weekly physical activity in 1 month.   Next Steps  Patient is to call for questions.

## 2021-07-15 NOTE — Patient Instructions (Addendum)
  Protein shake such as Orgain  Orgain protein powder, water or almond milk, frozen berries, 1/2 frozen banana  Snack option  Peanut butter on apple or celery  Peanut butter on bread  Low sodium boar's head [place] or leftover meat, 1 slice bread, mustard  Chicken salad made with fat free mayo, low sodium crackers  Leftovers  Add a snack or small meal at 11:00 pm

## 2021-07-16 ENCOUNTER — Telehealth: Payer: Self-pay | Admitting: Dietician

## 2021-07-16 NOTE — Telephone Encounter (Signed)
Patient seen in office 07/15/2021. I met with t:slim educators today. Called patient to address some of his concerns re:  blood glucose control and hypoglycemia overnight.    Patient states that he added a larger snack later last night with hypoglycemia but delayed from previous nights.  Discussed that sleep activity needs to be set for the time he sleeps.  It is set for 11:30 pm and patient usually sleeps between 2-4 am.    He could turn on the exercise activity which brings the blood glucose to goal of 140 rather than 110 to see if this helps avoid night hypoglycemia.  This would be a short term fix and not recommended long term.  Discussed that his correction factor may need to be changed.  Messaged Pharmacist, Wallace at Dr. Loren Racer office.  Patient states that he has worked with her in the past.  Discussed that it may be helpful for him to see her again regarding settings.  Antonieta Iba, RD, LDN, CDCES

## 2021-07-18 DIAGNOSIS — Z9641 Presence of insulin pump (external) (internal): Secondary | ICD-10-CM | POA: Diagnosis not present

## 2021-07-18 DIAGNOSIS — E1043 Type 1 diabetes mellitus with diabetic autonomic (poly)neuropathy: Secondary | ICD-10-CM | POA: Diagnosis not present

## 2021-07-18 DIAGNOSIS — K8689 Other specified diseases of pancreas: Secondary | ICD-10-CM | POA: Diagnosis not present

## 2021-07-19 DIAGNOSIS — T8130XA Disruption of wound, unspecified, initial encounter: Secondary | ICD-10-CM | POA: Diagnosis not present

## 2021-07-22 ENCOUNTER — Encounter (INDEPENDENT_AMBULATORY_CARE_PROVIDER_SITE_OTHER): Payer: Self-pay

## 2021-07-24 DIAGNOSIS — T8130XA Disruption of wound, unspecified, initial encounter: Secondary | ICD-10-CM | POA: Diagnosis not present

## 2021-07-25 ENCOUNTER — Telehealth (INDEPENDENT_AMBULATORY_CARE_PROVIDER_SITE_OTHER): Payer: Self-pay | Admitting: "Endocrinology

## 2021-07-25 NOTE — Telephone Encounter (Signed)
  Who's calling (name and relationship to patient) : Damir Leung (self)  Best contact number: [phone]  Provider they see: Dr. Fransico Michael  Reason for call: Pt states he is having issues with getting diabetic prescription. States that it needs to be updated. Requests to be reached.    PRESCRIPTION REFILL ONLY  Name of prescription:  Pharmacy:

## 2021-07-26 DIAGNOSIS — Z9641 Presence of insulin pump (external) (internal): Secondary | ICD-10-CM | POA: Diagnosis not present

## 2021-07-26 DIAGNOSIS — Z794 Long term (current) use of insulin: Secondary | ICD-10-CM | POA: Diagnosis not present

## 2021-07-26 DIAGNOSIS — E1069 Type 1 diabetes mellitus with other specified complication: Secondary | ICD-10-CM | POA: Diagnosis not present

## 2021-07-26 DIAGNOSIS — E1065 Type 1 diabetes mellitus with hyperglycemia: Secondary | ICD-10-CM | POA: Diagnosis not present

## 2021-07-26 NOTE — Telephone Encounter (Signed)
I spoke with [name]. He said that his prescription for his infusion sites in wrong. He said that he changes them very day and not every 1.5 to 2 days. I let him know that I would see about the issue.

## 2021-07-29 DIAGNOSIS — E103593 Type 1 diabetes mellitus with proliferative diabetic retinopathy without macular edema, bilateral: Secondary | ICD-10-CM | POA: Diagnosis not present

## 2021-07-29 DIAGNOSIS — L03115 Cellulitis of right lower limb: Secondary | ICD-10-CM | POA: Diagnosis not present

## 2021-07-29 DIAGNOSIS — F419 Anxiety disorder, unspecified: Secondary | ICD-10-CM | POA: Diagnosis not present

## 2021-07-29 DIAGNOSIS — F411 Generalized anxiety disorder: Secondary | ICD-10-CM | POA: Diagnosis not present

## 2021-07-30 ENCOUNTER — Other Ambulatory Visit: Payer: Self-pay

## 2021-07-30 ENCOUNTER — Ambulatory Visit: Payer: BC Managed Care – PPO | Admitting: Allergy and Immunology

## 2021-07-30 VITALS — BP 112/70 | HR 95 | Temp 98.2°F | Resp 20 | Ht 72.0 in | Wt 207.0 lb

## 2021-07-30 DIAGNOSIS — J302 Other seasonal allergic rhinitis: Secondary | ICD-10-CM

## 2021-07-30 DIAGNOSIS — J3089 Other allergic rhinitis: Secondary | ICD-10-CM

## 2021-07-30 DIAGNOSIS — J4550 Severe persistent asthma, uncomplicated: Secondary | ICD-10-CM | POA: Diagnosis not present

## 2021-07-30 NOTE — Progress Notes (Signed)
[facility] [place] [facility] [place] [facility] [place] [facility] [name]   Follow-up Note  Referring Provider: Lucila Maine Primary Provider: Lucila Maine Date of Office Visit: 07/30/2021  Subjective:   [name] (DOB: 01/05/1981) is a 40 y.o. male who [facility] in re-evaluation of the following:  HPI: [name] returns to this clinic in reevaluation of asthma and allergic rhinitis and reflux.  His last visit to this clinic was 22 January 2021.  His airway issue is going quite well as he continues to use mepolizumab.  However, he now has a [employer] and they have denied the use of mepolizumab.  They have denied the use of this agent because he is no longer using any controller agents for his asthma on a regular basis.  He discontinued all of his controller agents once he started mepolizumab.  At this point his requirement for short acting bronchodilator is 0.  He had very little problems with his nose.  His GI issues are being handled by gastroenterology successfully.  When I last saw him in his clinic he had significant muscle wasting and weakness and I asked him to discuss this issue with his neurologist to make sure he was not developing a myositis.  It turned out that he was actually in a starvation mode as his pancreas had completely stopped working and was not making pancreatic enzymes and since he has had placement with [facility]on he has been doing much better and is actually gained some weight.  He has had 4 COVID vaccines.  Allergies as of 07/30/2021       Reactions   Flonase [fluticasone] Shortness Of Breath   Sulfa Antibiotics    Shortness of breath, increased heart rate; "happened quite a while ago"   Molds & Smuts Other (See Comments)   Aggravate asthma   Sulfites Itching, Swelling   Tetracyclines & Related Other (See Comments)   increased intracranial pressure   Lactose Intolerance (gi) Other (See  Comments)   Upset stomach   [place] Cough        Medication List    acetaZOLAMIDE 250 MG tablet Commonly known as: DIAMOX Take 250 mg by mouth 2 (two) times daily.   albuterol 108 (90 Base) MCG/ACT inhaler Commonly known as: ProAir HFA Inhale 2 puffs into the lungs every 6 (six) hours as needed for wheezing or shortness of breath.   albuterol (2.5 MG/3ML) 0.083% nebulizer solution Commonly known as: PROVENTIL Take 3 mLs (2.5 mg total) by nebulization every 4 (four) hours as needed for wheezing or shortness of breath.   ALPRAZolam 0.25 MG tablet Commonly known as: XANAX Take 0.25 mg by mouth at bedtime.   Asmanex HFA 200 MCG/ACT Aero Generic drug: Mometasone Furoate For asthma flare, begin Asmanex 200-2 puffs twice a day for 1-2 weeks or until cough and wheeze free   aspirin EC 81 MG tablet Take 162 mg by mouth daily as needed for moderate pain. Swallow whole.   atorvastatin 20 MG tablet Commonly known as: LIPITOR Take 20 mg by mouth daily.   Auvi-Q 0.3 mg/0.3 mL Soaj injection Generic drug: EPINEPHrine Use as directed for life-threatening allergic reaction.   Baqsimi Two Pack 3 MG/DOSE Powd Generic drug: Glucagon Place 1 spray into the nose as directed.   budesonide 0.5 MG/2ML nebulizer solution Commonly known as: PULMICORT Take 2 mLs (0.5 mg total) by nebulization as directed.   budesonide-formoterol 160-4.5 MCG/ACT inhaler  Commonly known as: Symbicort Inhale 2 puffs into the lungs 2 (two) times daily.   calcium carbonate 1500 (600 Ca) MG Tabs tablet Commonly known as: OSCAL Take 600 mg of elemental calcium by mouth once a week.   cephALEXin 500 MG capsule Commonly known as: KEFLEX Take 500 mg by mouth 4 (four) times daily.   Dexcom G6 Receiver Devi 1 Device by Does not apply route as directed.   Dexcom G6 Transmitter Misc Change every 90 days   FreeStyle Libre 2 Sensor Misc Inject 1 Device into the skin every 14 (fourteen) days. Place  prescription on hold. To use if Dexcom CGM fails.   Dexcom G6 Sensor Misc Change sensor every 10 days   glucose blood test strip Use to check blood sugar 8 x per day. Please fill for Onetouch Verio test strips.   HumaLOG Junior KwikPen 100 UNIT/ML KwikPen Junior Generic drug: insulin lispro Inject up to 50 units daily per provider instructions   HumaLOG 100 UNIT/ML injection Generic drug: insulin lispro Use 300 units in insulin pump every 48 hours   levocetirizine 5 MG tablet Commonly known as: Xyzal Take 1 tablet (5 mg total) by mouth daily as needed for allergies.   lipase/protease/amylase 99242 UNITS Cpep capsule Commonly known as: CREON 3 pills with every meal and 2 pills with every snack   losartan 50 MG tablet Commonly known as: COZAAR TAKE 1 TABLET(50 MG) BY MOUTH DAILY   montelukast 10 MG tablet Commonly known as: SINGULAIR Take 1 tablet (10 mg total) by mouth at bedtime.   multivitamin with minerals Tabs tablet Take 1 tablet by mouth daily.   Nucala 100 MG/ML Soaj Generic drug: Mepolizumab INJECT 100MG  SUBCUTANEOUSLY EVERY 4 WEEKS (GIVEN AT MD  OFFICE)   omeprazole 20 MG capsule Commonly known as: PRILOSEC Take 1 capsule (20 mg total) by mouth daily.   Pen Needles 32G X 4 MM Misc Use to inject insulin 6x per day   topiramate 50 MG tablet Commonly known as: TOPAMAX TAKE 1 TABLET(50 MG) BY MOUTH TWICE DAILY   Vitamin D (Ergocalciferol) 1.25 MG (50000 UNIT) Caps capsule Commonly known as: DRISDOL Take 50,000 Units by mouth every 7 (seven) days.        Past Medical History:  Diagnosis Date   ADHD (attention deficit hyperactivity disorder)    Anxiety    Asthma    Autonomic neuropathy due to diabetes (HCC)    Chronic headaches    Diabetes (HCC)    Fatigue    GERD (gastroesophageal reflux disease)    Goiter    High blood pressure    High cholesterol    Hypercholesterolemia    Hypertension    Hypoglycemia associated with diabetes (HCC)     Malnutrition (HCC)    Obesity    Sleep apnea    mild, no cpap   Tachycardia    Type 1 diabetes mellitus not at goal [facility])    Uncontrolled DM with microalbuminuria or microproteinuria     Past Surgical History:  Procedure Laterality Date   EYE SURGERY Right 05/30/2020   Vitrectomy, Dr. 07/30/2020   EYE SURGERY  04/2020   IR 3D INDEPENDENT WKST  07/16/2020   IR ANGIO INTRA EXTRACRAN SEL COM CAROTID INNOMINATE BILAT MOD SED  07/16/2020   IR ANGIO VERTEBRAL SEL SUBCLAVIAN INNOMINATE UNI L MOD SED  07/16/2020   IR ANGIO VERTEBRAL SEL VERTEBRAL UNI R MOD SED  07/16/2020   IR 07/18/2020 GUIDE VASC ACCESS RIGHT  07/16/2020  REFRACTIVE SURGERY     x9   WISDOM TOOTH EXTRACTION      Review of systems negative except as noted in HPI / PMHx or noted below:  Review of Systems  Constitutional: Negative.   HENT: Negative.    Eyes: Negative.   Respiratory: Negative.    Cardiovascular: Negative.   Gastrointestinal: Negative.   Genitourinary: Negative.   Musculoskeletal: Negative.   Skin: Negative.   Neurological: Negative.   Endo/Heme/Allergies: Negative.   Psychiatric/Behavioral: Negative.      Objective:   Vitals:   07/30/21 1133  BP: 112/70  Pulse: 95  Resp: 20  Temp: 98.2 F (36.8 C)  SpO2: 97%   Height: 6' (182.9 cm)  Weight: 207 lb (93.9 kg)   Physical Exam Constitutional:      Appearance: He is not diaphoretic.  HENT:     Head: Normocephalic.     Right Ear: Tympanic membrane, ear canal and external ear normal.     Left Ear: Tympanic membrane, ear canal and external ear normal.     Nose: Nose normal. No mucosal edema or rhinorrhea.     Mouth/Throat:     Pharynx: Uvula midline. No oropharyngeal exudate.  Eyes:     Conjunctiva/sclera: Conjunctivae normal.  Neck:     Thyroid: No thyromegaly.     Trachea: Trachea normal. No tracheal tenderness or tracheal deviation.  Cardiovascular:     Rate and Rhythm: Normal rate and regular rhythm.     Heart sounds: Normal heart sounds, S1  normal and S2 normal. No murmur heard. Pulmonary:     Effort: No respiratory distress.     Breath sounds: Normal breath sounds. No stridor. No wheezing or rales.  Lymphadenopathy:     Head:     Right side of head: No tonsillar adenopathy.     Left side of head: No tonsillar adenopathy.     Cervical: No cervical adenopathy.  Skin:    Findings: No erythema or rash.     Nails: There is no clubbing.  Neurological:     Mental Status: He is alert.    Diagnostics:    Spirometry was performed and demonstrated an FEV1 of 3.16 at 70 % of predicted.   Assessment and Plan:   1. Asthma, severe persistent, well-controlled   2. Seasonal and perennial allergic rhinitis     1. Continue to Treat and prevent inflammation of airway:   A. montelukast 10 mg tablet once a day  B. Further treatment???   2. If needed:   A. nasal saline / wash  B. OTC antihistamine  C. Proair HFA / Xopenex 2 inhalations or albuterol every 4-6 hours  3. Obtain fall flu vaccine  4. Return in 6 months or earlier if problem   We will have [name] move forward without the use of mepolizumab as his insurance company has denied use of this biologic agent to treat his inflammatory condition of his airway.  If he develops more symptoms we will place him back on controller agents other than just a leukotriene modifier and if he fails therapy for those agents then we will once again reapply for mepolizumab injections.  He will keep in close contact with me noting his response to this approach as we move forward.  Laurette Schimke, MD Allergy / Immunology [facility]

## 2021-07-30 NOTE — Patient Instructions (Signed)
  1. Continue to Treat and prevent inflammation of airway:   A. montelukast 10 mg tablet once a day  B. Further treatment???   2. If needed:   A. nasal saline / wash  B. OTC antihistamine  C. Proair HFA / Xopenex 2 inhalations or albuterol every 4-6 hours  3. Obtain fall flu vaccine  4. Return in 6 months or earlier if problem

## 2021-07-31 ENCOUNTER — Encounter: Payer: Self-pay | Admitting: Allergy and Immunology

## 2021-07-31 NOTE — Telephone Encounter (Signed)
Spoke with [facility]. 90 day shipment of 90 has been shipped out. Billing department doesn't need anything further. Patient notified.

## 2021-08-02 DIAGNOSIS — T8133XA Disruption of traumatic injury wound repair, initial encounter: Secondary | ICD-10-CM | POA: Diagnosis not present

## 2021-08-02 DIAGNOSIS — E1043 Type 1 diabetes mellitus with diabetic autonomic (poly)neuropathy: Secondary | ICD-10-CM | POA: Diagnosis not present

## 2021-08-02 DIAGNOSIS — E103591 Type 1 diabetes mellitus with proliferative diabetic retinopathy without macular edema, right eye: Secondary | ICD-10-CM | POA: Diagnosis not present

## 2021-08-05 ENCOUNTER — Ambulatory Visit (INDEPENDENT_AMBULATORY_CARE_PROVIDER_SITE_OTHER): Payer: BC Managed Care – PPO | Admitting: Family Medicine

## 2021-08-05 VITALS — Ht 72.0 in | Wt 206.0 lb

## 2021-08-05 DIAGNOSIS — M25512 Pain in left shoulder: Secondary | ICD-10-CM | POA: Diagnosis not present

## 2021-08-05 DIAGNOSIS — M25511 Pain in right shoulder: Secondary | ICD-10-CM | POA: Diagnosis not present

## 2021-08-05 DIAGNOSIS — M791 Myalgia, unspecified site: Secondary | ICD-10-CM

## 2021-08-05 DIAGNOSIS — M7741 Metatarsalgia, right foot: Secondary | ICD-10-CM

## 2021-08-05 DIAGNOSIS — F411 Generalized anxiety disorder: Secondary | ICD-10-CM | POA: Diagnosis not present

## 2021-08-05 NOTE — Patient Instructions (Signed)
Continue the cardio you've been doing and don't advance it at this time. Continue the exercises with play-doh, elbow, and knee exercises 3 days a week. Add theraband and scapular strengthening exercises also 3 days a week.  We want to build strength but not overdo it or overwhelm you with rehab exercises. We placed new metatarsal pads and added one on the left side also. If you get compression stockings I would order a pair from a medical supply store like [facility] to get the 20-84mm Hg pressure - the ones you have do not feel like they have this amount of compression. Follow up in about 6-8 weeks.

## 2021-08-05 NOTE — Progress Notes (Signed)
Jeanpierre Thebeau - 40 y.o. male MRN [id number]  Date of birth: 1981-07-18  SUBJECTIVE:    CC: Joint Pain  3/9: Patient reports over the past 2 months he has felt increasingly weak. Primary complaint today is feeling like his feet and hands are 'down to the bone' and it feels uncomfortable when holding the steering wheel, walking. Feels at times like he cannot hold his body weight. Of note he's had difficulty with his insulin pump in getting sites to work (better past 5 days). Relatedly he's lost about 10+ pounds over the past 1-2 months. Reports large caloric deficit taking in 800-900 calories a day over past couple months. Denies numbness or tingling in feet or hands. Microfilament testing has been normal per endo notes. He is also scheduled to see neurologist tomorrow. Has regular appointments with diabetes educator but not dietitian. Denies nausea or vomiting though appetite is not what it has been. Autonomic dysfunction noted in chart - asked him about gastroparesis and he reports history of this being mentioned remotely.   3/23: Patient reports he feels like he's worse than last visit. Biggest problem is recent stomach virus with nausea, diarrhea, abdominal pain - feels this has improved but was present for several days. Blood sugars not increasing with his carbohydrate intake - even had to turn off his basal rate because blood sugar was dropping. This morning it appears to be rising in response to diet, had no ketones this morning and basal is now back on. Stomach feels crampy. Has not seen GI in past. Started BRAT diet as recommended by Dr. Fransico Michael. Had initial visit to dietitian and doesn't follow up for about 3-4 more weeks - most discussion was general in the initial visit.   4/6: [name] comes in today very fatigued, run down. He reports he is not even running his insulin pump 90% of the time and maximum bolus he's given himself over more than a week was 1 unit of  insulin. Blood sugars still not responding to his dietary intake. He's consuming more than 2000 calories a day and has been tracking this regularly on an app. Eating variety of things but primarily eggs, sausage, bread, applesauce throughout the day. He is not sleeping because of concern about his blood sugars dropping overnight and not being surrounded by people who know how to take care of this if it were to happen. Takes more than an hour for blood sugar to stabilize when it starts to drop despite drinking sugar-containing beverages. He had EGD with Dr. Christella Hartigan after evaluation - some gastritis and biopsies sent to assess for celiac but otherwise normal test. Gastric emptying study was normal in 2019. Has a pancreatic MRI scheduled for Saturday. Reports he's not sure how long he can continue like this though denied suicidal ideation when asked directly and no plan.   5/9: Patient was admitted to the hospital after last visit and had extensive workup. Found to have exocrine pancreatic insufficiency. Some concern his frequent monitoring is causing increased distress leading to decreased sleep and anxiety. They had a discussion regarding switch from pump to basal + bolus but patient would like to continue with pump - we separately discussed this today as well. Other labwork including for celiac, thyroid dysfunction, adrenal insufficiency normal. No insulinoma on imaging. He's interested in a more specific dietary plan than he's received to date - Dr. Christella Hartigan looked into this for him and so far no luck. He continues to have site issues having to change  daily. Tried walking for exercise once - 30 minutes on treadmill and this caused quite a bit of joint pain subsequent days.  5/22: He has lost 2 pounds since last visit.  Started PT which has made him more fatigued. Reports not helping much yet.  Knee pain is bothering him the most recently. Feels like bone on bone because he has no muscle tone  to help support joints.  He is still having insulin pump and sensor problems.  He has to check his sugars every hour and dose as needed. He is even waking up hourly overnight.  Talked to a dietitian last week. His mood is down still. He does take some alprazolam at night.   7/18: Patient returns reporting improvements in his physical activity. He is able to stand for up to 30 minutes at a time now and walk on treadmill 10 minutes at a time. Has been doing some easy exercises at home but no longer going to PT. He is working with endocrinology on his CGM, site issues. He is going to see specialists in GI and endocrinology later this year at [facility]. Has notable severe exocrine pancreatic insufficiency in addition to his longstanding T1DM  8/29: Patient returns having seen [facility] Friday and having upper endoscopy - has appointment to get full results in about a week. Planning to see [facility] end[facility]inology too and meet with nutritionist for exocrine insufficiency at [facility]. He's not been able to advance his cardio - 10-15 minutes max on treadmill and cannot do this on back to back days. Doing strengthening exercises of hands, would like additional ones for elbow and knee pain.  10/10: [name] has been able to exercise on treadmill 10-15 minutes most days of the week now. He continues to do the home strengthening exercises for hands, elbows, and knees. Difficulty with shoulder pain as well though no acute injuries here. Has seen dietitian for both his DM and virtually one from [facility] for his pancreatic exocrine insufficiency who helped 'fill in the gaps' of his knowledge with respect to this and his creon dosage.  Objective:  VS: BP:   HR: bpm  TEMP: ( )  RESP:   HT:6' (182.9 cm)   WT:206 lb (93.4 kg)  BMI:27.93 PHYSICAL EXAM:  Gen: NAD, comfortable in exam room  Left knee: Mild muscle atrophy diffusely.  No gross deformity, ecchymoses, swelling. Mild TTP medial, lateral joint lines, post  patellar facets. FROM with normal strength. Negative ant/post drawers. Negative valgus/varus testing. Negative lachman. Negative mcmurrays, apleys. NV intact distally.  Bilateral shoulders: No swelling, ecchymoses.  Mild scap winging. No TTP. FROM. Negative Hawkins, Neers. Strength 5/5 with empty can and resisted internal/external rotation. NV intact distally.  ASSESSMENT & PLAN: 1. Muscle weakness/atrophy - he is doing better with respect to his cardio.  Add exercises for his shoulders, scapular stabilizers.  Continue other exercises.  F/u in 6-8 weeks.  Also discussed his right great toe is 'turning in' - on exam transverse arch collapse with toe splaying but no hallux valgus.  Add metatarsal pad, replaced one on right insert also.    Was told by wound care to get some compression stockings with 20-7mmHg pressure - he bought some online but these do not appear to have that level of compression - encouraged getting these at medical supply store.

## 2021-08-09 ENCOUNTER — Telehealth (INDEPENDENT_AMBULATORY_CARE_PROVIDER_SITE_OTHER): Payer: Self-pay

## 2021-08-12 ENCOUNTER — Other Ambulatory Visit (INDEPENDENT_AMBULATORY_CARE_PROVIDER_SITE_OTHER): Payer: Self-pay | Admitting: "Endocrinology

## 2021-08-12 DIAGNOSIS — I679 Cerebrovascular disease, unspecified: Secondary | ICD-10-CM | POA: Diagnosis not present

## 2021-08-12 DIAGNOSIS — F411 Generalized anxiety disorder: Secondary | ICD-10-CM | POA: Diagnosis not present

## 2021-08-15 ENCOUNTER — Encounter (INDEPENDENT_AMBULATORY_CARE_PROVIDER_SITE_OTHER): Payer: Self-pay

## 2021-08-16 ENCOUNTER — Telehealth (INDEPENDENT_AMBULATORY_CARE_PROVIDER_SITE_OTHER): Payer: Self-pay

## 2021-08-16 ENCOUNTER — Other Ambulatory Visit (INDEPENDENT_AMBULATORY_CARE_PROVIDER_SITE_OTHER): Payer: Self-pay

## 2021-08-16 MED ORDER — DEXCOM G6 SENSOR MISC: 11 refills | Status: DC

## 2021-08-19 ENCOUNTER — Encounter (INDEPENDENT_AMBULATORY_CARE_PROVIDER_SITE_OTHER): Payer: Self-pay

## 2021-08-19 DIAGNOSIS — R1013 Epigastric pain: Secondary | ICD-10-CM | POA: Diagnosis not present

## 2021-08-19 DIAGNOSIS — K8681 Exocrine pancreatic insufficiency: Secondary | ICD-10-CM | POA: Diagnosis not present

## 2021-08-20 ENCOUNTER — Telehealth (INDEPENDENT_AMBULATORY_CARE_PROVIDER_SITE_OTHER): Payer: Self-pay | Admitting: Pharmacist

## 2021-08-20 NOTE — Telephone Encounter (Signed)
Patient stopped by the office asking if Dr. Ladona Ridgel was available.Patient is having trouble getting sites to work. Requests call back from Dr. Ladona Ridgel. Call back number is [phone].

## 2021-08-20 NOTE — Telephone Encounter (Signed)
Called patient on 08/20/2021 at 4:20 PM   Patient states he has been using [employer] XC sites on his abdomen / back. He previously used [employer] sites on his legs. He states he has lost too much weight to use [employer] on his legs, claims "he feels it hits his muscle". He is hesitant to use his arms as he feels that he has lost too  much weight to use sites in his arms. Plus he likes to use his arm for his CGM.  [name] states he has been having site issues since this past Friday, particularly bleeding.  Scheduled appt tomorrow 10/26 at 3:30 pm to re-evealuate pump site issues  Thank you for involving clinical pharmacist/diabetes educator to assist in providing this patient's care.   Zachery Conch, PharmD, BCACP, CDCES, CPP

## 2021-08-21 ENCOUNTER — Other Ambulatory Visit: Payer: Self-pay

## 2021-08-21 ENCOUNTER — Ambulatory Visit (INDEPENDENT_AMBULATORY_CARE_PROVIDER_SITE_OTHER): Payer: BC Managed Care – PPO | Admitting: Pharmacist

## 2021-08-21 VITALS — Wt 204.6 lb

## 2021-08-21 DIAGNOSIS — E1065 Type 1 diabetes mellitus with hyperglycemia: Secondary | ICD-10-CM

## 2021-08-21 LAB — POCT GLYCOSYLATED HEMOGLOBIN (HGB A1C): Hemoglobin A1C: 6.2 % — AB (ref 4.0–5.6)

## 2021-08-21 LAB — POCT GLUCOSE (DEVICE FOR HOME USE): POC Glucose: 149 mg/dl — AB (ref 70–99)

## 2021-08-21 NOTE — Progress Notes (Signed)
S:     Chief Complaint  Patient presents with   Diabetes    Education      Endocrinology provider: Dr. Fransico Michael (no upcoming appt)  Patient referred to me by Dr. Fransico Michael for insulin pump initiation and training. I have been assisting with further diabetes management per Dr. Fransico Michael and patient's preference. 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 Patient wears a t:slim X2 insulin pump and Dexcom G6 CGM. Patient was started on t:slim X2 insulin pump on 02/07/21. Patient has been followed for pump adjustments / pump education since initiation of t:slim X2 insulin pump.  At prior appointment with myself on 05/21/2021, TIR is at goal > 70%. No Hypoglycemia. Patient remains having CGM issues and trusting CGM BG readings. He claims he has to disconnect pump frequently, however, is in control IQ mode 95% of the time. Patient would prefer to continue wearing CGM and using control IQ mode with tandem pump. Will reach out to Ardyth Man, RD, CDCES, ADEPT (senior clinical Scientist, water quality at [facility]) to contact [name] regarding issues with CGM. Continue wearing Dexcom G6 CGM CGM.   Patient presents today discuss bad pump sites per telephone call on 08/20/2021. Patient reports that he has been having issues with his pump sites and that they have not been "absorbing insulin well." Reports that some sites have been bleeding. Reports that he has been giving his insulin about 1-2 hours after bolusing to work, including walking on the treadmill. He has not noticed that it happens with certain meals. He reports that his current site has been working for the last two meals he is eating. He reports going on the treadmill to "get the  insulin to work." Has been doing this with every meal. Has not started any new medications, herbals, or supplements. He reports that he feels when his CGM arrow is increasing that his insulin is not working appropriately. He reports having as many as 6 bad pump sites in a day. He doesn't always press "new cartridge" when he changes pump site as he re-uses old cartridge, but reports he does fill tubing/fill cannula when he puts on a new site.  Insurance: Development worker, international aid (Max: 3.0) 12AM 0.9   11 AM 1.1                      Total: 26.4 units   Insulin to carbohydrate ratio (ICR)  12AM 10  11AM  9                    Max Bolus: 25   Insulin Sensitivity Factor (ISF) 12AM 50                             Target BG 12AM 120                             Pump Serial Number: [id number]   Infusion Set: [employer]osoft sites  Infusion Set Sites -Patient reports infusion sites are thighs, lower back --Patient reports independently doing infusion set site changes --Patient reports rotating infusion set sites Getting [employer]el and [employer]os[employer] sites as of right now   Diet  (no changes since prior appt  on 04/15/21) Patient reported dietary habits: -Breakfast (10-11am; took 3 capsule of Creon): 2 eggs, sausage link, [place], 1 slice of bread (15-16 g of carb), mustard -Lunch (~2pm): sandwich (~30 grams of carb) , mixed vegetables -Snack (sometimes, 2 capsule of Creon): nuts, [place], broccoli, fig bar -Dinner (~7:30 pm; 3 capsules of Creon): eggs, chicken, broccoli, baby carrots, 1 slice of bread (15-16 g of carb) -Snack (~12-1am, always snacks at this dinner; 2 capsule of Creon): quest protein chips, nuts, [place], fig bar, pepperoni sticks -Fast food: none -Sweets/deserts: none -Pasta/rice: none -Fruit: 1 or 2 slices of apple, blueberries -Cereal: none -Chips/crackers: quest protein chips -Milk: none (lactose intolerant) -Soda/juice: none Now consuming more olive oil,  avocado   Exercise (no changes since prior appt on 04/15/21) Patient-reported exercise habits:  PT ([name]) 1-2x/week about 1 hours each time          O:   Labs:      Tconnect Report    Lab Results  Component Value Date   HGBA1C 6.3 (A) 01/30/2021   HGBA1C 7.5 (A) 12/05/2020   HGBA1C 6.2 (A) 08/16/2020    Lab Results  Component Value Date   CPEPTIDE <0.1 (L) 01/30/2021       Component Value Date/Time   CHOL 197 01/24/2021 0853   TRIG 111 01/24/2021 0853   HDL 40 01/24/2021 0853   CHOLHDL 4.9 01/24/2021 0853   VLDL 19 12/29/2016 1146   LDLCALC 135 (H) 01/24/2021 0853    Lab Results  Component Value Date   MICRALBCREAT 2 05/14/2020    Assessment: DM Management / education- TIR is at goal > 70%. No Hypoglycemia. In the past week there were only 2 site change symbols. Patient reported this is likely due to how he doesn't press fill cartridge. I thought you had to press fill cartridge to continue to fill tubing/cannula, but patient reports he is filling tubing/cannula. Explained to patient if he is not filling tubing/cannula that can lead bad pump sites. Also, counseled thoroughly that if CGM arrow goes up after eating that this is okay. Advised him if his blood sugar does not decrease to less than 200 mg/dL after 2 hours after eating that can indicate he may require pump setting change. Advised him if his blood sugar does not decrease to less than 300 mg/dL after 2 hours this can be a bad pump site (reviewed bad pump site management; administer correction dose via Humalog Kwikpen THEN do site change). We discussed instead of putting in ghost carbs to adjust his ICR by 1 after 2 days of a pattern of hyperglycemia or hypoglycemia (increase by 1 if hypoglycemic two days in a row at the same time after the same meal while decrease by 1 if hyperglycemia two days in a row at the same time after the same meal). Explained if he corrects for hyperglycemia two days in a row  and does not notice a decrease in BG 2 hours after eating he can decrease his ISF by 5. I will closely monitor him and be available via MyChart if he has any questions  Plan: Continue insulin pump settings  Educated patient thoroughly on bad pump site management and making appropriate insulin adjustments Monitoring:  Continue wearing Continue Dexcom G6 CGM [name] has a diagnosis of diabetes, checks blood glucose readings > 4x per day, wears an insulin pump, and requires frequent adjustments to insulin regimen. This patient will be seen every six months, minimally, to assess adherence to their  CGM regimen and diabetes treatment plan. Follow Up: 08/27/21 8:30 am  This appointment required 60 minutes of patient care (this includes precharting, chart review, review of results, face-to-face care, etc.).

## 2021-08-26 ENCOUNTER — Encounter: Payer: Self-pay | Admitting: Allergy and Immunology

## 2021-08-26 DIAGNOSIS — F411 Generalized anxiety disorder: Secondary | ICD-10-CM | POA: Diagnosis not present

## 2021-08-27 ENCOUNTER — Other Ambulatory Visit: Payer: Self-pay

## 2021-08-27 ENCOUNTER — Encounter: Payer: Self-pay | Admitting: Allergy & Immunology

## 2021-08-27 ENCOUNTER — Ambulatory Visit: Payer: BC Managed Care – PPO | Admitting: Allergy & Immunology

## 2021-08-27 VITALS — BP 142/64 | HR 86 | Temp 98.1°F | Resp 20 | Ht 72.0 in | Wt 206.4 lb

## 2021-08-27 DIAGNOSIS — J4551 Severe persistent asthma with (acute) exacerbation: Secondary | ICD-10-CM | POA: Diagnosis not present

## 2021-08-27 DIAGNOSIS — J302 Other seasonal allergic rhinitis: Secondary | ICD-10-CM

## 2021-08-27 DIAGNOSIS — J3089 Other allergic rhinitis: Secondary | ICD-10-CM | POA: Diagnosis not present

## 2021-08-27 MED ORDER — AIRDUO DIGIHALER 113-14 MCG/ACT IN AEPB: 1.0000 | INHALATION_SPRAY | Freq: Two times a day (BID) | RESPIRATORY_TRACT | 5 refills | Status: AC

## 2021-08-27 MED ORDER — ALBUTEROL SULFATE HFA 108 (90 BASE) MCG/ACT IN AERS: 2.0000 | INHALATION_SPRAY | Freq: Four times a day (QID) | RESPIRATORY_TRACT | 2 refills | Status: DC | PRN

## 2021-08-27 NOTE — Patient Instructions (Addendum)
1. Severe persistent asthma with acute exacerbation - We are going to get some labs to see if you qualify for Nucala (need elevated eosinophils). - We will try Tezspire if the labs do not confirm elevated eosinophils.  - Information on Tezspire provided today. - Tammy will be in touch with you. - I am going to hold off on prednisone for now given your type 1 diabetes. - Start AirDuo 113/49mcg one puff twice daily (sample provided). - We also sent this into the pharmacy which will contact you for shipping information (it should be fairly inexpensive). - Lung testing was in the mid 50% range but it did improve with albuterol. - Hopefully starting the AirDuo will be enough to help, but if not, start the prednisone pack provided (this is a low dose that hopefully should not affect your glucose as much.   2. Return in about 3 months (around 11/27/2021) or as scheduled.    Please inform us of any Emergency Department visits, hospitalizations, or changes in symptoms. Call us before going to the ED for breathing or allergy symptoms since we might be able to fit you in for a sick visit. Feel free to contact us anytime with any questions, problems, or concerns.  It was a pleasure to see you again today!  Websites that have reliable patient information: 1. American Academy of Asthma, Allergy, and Immunology: www.aaaai.org 2. Food Allergy Research and Education (FARE): foodallergy.org 3. Mothers of Asthmatics: http://www.asthmacommunitynetwork.org 4. American College of Allergy, Asthma, and Immunology: www.acaai.org   COVID-19 Vaccine Information can be found at: [email or web address] For questions related to vaccine distribution or appointments, please email [email or web address] or call [phone].   We realize that you might be concerned about having an allergic reaction to the COVID19 vaccines. To help with that concern, WE ARE OFFERING THE  COVID19 VACCINES IN OUR OFFICE! Ask the front desk for dates!     "Like" [place] on Facebook and Instagram for our latest updates!      A healthy democracy works best when [employer] participate! Make sure you are registered to vote! If you have moved or changed any of your contact information, you will need to get this updated before voting!  In some cases, you MAY be able to register to vote online: [email or web address]    EARLY VOTING HAS STARTED! If you still need to register to vote, you can do this and cast a ballot at any of the early voting locations!

## 2021-08-27 NOTE — Progress Notes (Signed)
FOLLOW UP  Date of Service/Encounter:  08/27/21   Assessment:   Severe persistent asthma with acute exacerbation - with a history of recurrent prednisone courses, previously doing well on Nucala   Seasonal and perennial allergic rhinitis   Gastroesophageal reflux disease   Complicated past medical history, including pseudotumor cerebri and type 1 diabetes mellitus with recent pancreatic failure  Plan/Recommendations:   1. Severe persistent asthma with acute exacerbation - We are going to get some labs to see if you qualify for Nucala (need elevated eosinophils). - We will try Tezspire if the labs do not confirm elevated eosinophils.  - Information on Tezspire provided today. - Tammy will be in touch with you. - I am going to hold off on prednisone for now given your type 1 diabetes. - Start AirDuo 113/4mcg one puff twice daily (sample provided). - We also sent this into the pharmacy which will contact you for shipping information (it should be fairly inexpensive). - Lung testing was in the mid 50% range but it did improve with albuterol. - Hopefully starting the AirDuo will be enough to help, but if not, start the prednisone pack provided (this is a low dose that hopefully should not affect your glucose as much.   2. Return in about 3 months (around 11/27/2021) or as scheduled.   Subjective:   [name] is a 40 y.o. male presenting today for follow up of  Chief Complaint  Patient presents with   Asthma    Breathing issues(last month), last couple of weeks the breathing issues have gotten worse, sinus congestion, occasional runny nose, no signs of fever.  ACT- 16    [name] has a history of the following: Patient Active Problem List   Diagnosis Date Noted   Exocrine pancreatic insufficiency 02/27/2021   Hypoglycemia 01/30/2021   Nuclear sclerotic cataract of both eyes 09/12/2020   Ischemic optic neuritis, right 07/19/2020   Cerebral  arteriosclerosis 07/19/2020   Photophobia, left eye 07/19/2020   Optic neuritis, posterior 07/10/2020   Color vision deficiency 07/10/2020   Vitreomacular adhesion of left eye 07/03/2020   H/O visual field defect 06/14/2020   Stable treated proliferative diabetic retinopathy of left eye with macular edema determined by examination associated with type 1 diabetes mellitus (HCC) 05/28/2020   Benign intracranial hypertension 05/28/2020   Stable treated proliferative diabetic retinopathy of right eye with macular edema determined by examination associated with type 1 diabetes mellitus (HCC) 05/28/2020   Vitreous hemorrhage of right eye (HCC) 05/28/2020   Pain in joint, shoulder region 04/17/2020   Finger numbness 04/17/2020   Moderate persistent asthma without complication 12/27/2019   Contusion of back, right, initial encounter 04/12/2019   Left shoulder pain 04/12/2019   Acute pain of left wrist 04/12/2019   Pseudotumor cerebri syndrome 02/16/2019   Class 1 obesity with serious comorbidity and body mass index (BMI) of 32.0 to 32.9 in adult 02/16/2019   Idiopathic normal pressure hydrocephalus (HCC) 12/15/2018   Severe persistent asthma with acute exacerbation 08/31/2018   Seasonal and perennial allergic rhinitis 08/31/2018   Mild intermittent asthma without complication 07/12/2018   Type 1 diabetes mellitus with other specified complication (HCC) 06/09/2018   Sore throat 04/26/2018   Moderate persistent asthma with acute exacerbation 04/26/2018   Other allergic rhinitis 04/26/2018   Gastroesophageal reflux disease 04/26/2018   Food allergy 04/26/2018   Pseudotumor cerebri 03/25/2018   Morbid obesity with BMI of 40.0-44.9, adult (HCC) 03/25/2018   Altitude sickness, subsequent encounter  03/25/2018   Encounter for medication management 03/25/2018   Snoring 03/25/2018   Increased intracranial pressure 03/20/2018   Low back pain 03/10/2018   Cervicalgia of occipito-atlanto-axial region  08/31/2017   Type 1 diabetes mellitus with complication (HCC) 08/31/2017   Calcified lymph nodes 06/04/2017   Insulin pump in place 05/28/2017   Neuropathy 05/28/2017   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, with proliferative retinopathy (HCC) 05/28/2017   Other symptoms and signs involving the nervous system 05/28/2017   Nightmares REM-sleep type 05/28/2017   Proliferative diabetic retinopathy, right eye (HCC) 04/11/2017   Benign paroxysmal positional vertigo 03/29/2017   Costochondritis 03/06/2017   Acute upper respiratory infection 03/06/2017   Combined hyperlipidemia 02/11/2016   Metatarsalgia of right foot 02/07/2016   Lactose intolerance 01/07/2016   Inappropriate sinus tachycardia 06/29/2015   Thoracic back pain 11/02/2013   Type 1 diabetes mellitus not at goal [facility])    Goiter    Severe obesity (BMI 35.0-39.9) with comorbidity (HCC)    Hypercholesterolemia    ADHD (attention deficit hyperactivity disorder)    Fatigue    Tachycardia    Autonomic neuropathy associated with type 1 diabetes mellitus (HCC)    Hypertension    Uncontrolled DM with microalbuminuria or microproteinuria    Hypoglycemia associated with diabetes (HCC)    DIABETES MELLITUS, I 12/24/2006   ATTENTION DEFICIT, W/HYPERACTIVITY 12/24/2006    History obtained from: chart review and patient.  [name] is a 40 y.o. male presenting for a sick visit. He was last seen by Dr. Lucie Leather in October 2022. At that time, he was continued on montelukast.  He was doing very well on Nucala, which he was injecting at home.  He was continued on albuterol as needed.  Reflux was controlled with his medications.  He was having a lot of weight loss which was being evaluated by sports medicine.  It was felt that he was having muscle mass loss.  Since the last visit, he has mostly done well. Unfortunately, his insurance company took him off of [place]. They switched insurances and the new insurance said that he was  "too healthy". This is his bad time of the year. COVID was negative at home. He works predominantly from home and has no contact with other people at all. He has had no fevers or body aches. He endorses only tightness in the chest with occasional rhinorrhea.   He did stop using his Symbicort and Asmanex two years ago. He has not had refills of those and is unsure where to go from there. He did not get a new job, but they changed from [employer] to a [employer]. They have been denying everything since changing to this new company.  He is very frustrated with this.  I talked to Tammy and she has been submitting paperwork to get the [place] approved under his new insurance.  They have had several denials.  Asthma/Respiratory Symptom History: He no longer has a controller medication since the Nucala was working so well.  He is interested in getting 1.  He has not required prednisone in more than a year because the [place] was working so well.  He does have an albuterol inhaler which she has been using and has been working, albeit temporarily.  He endorses a lot of chest tightness. [name] He is currently on Nucala monthly, which he administers at home. The last time he had prednisone.   He was on Xolair for a period of time.    He  is working with [employer] out of [place]. 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   Allergic Rhinitis Symptom History: He is not having much in the way of sinus congestion or nasal discharge.  He does report some postnasal drip, but this is not predominant.  Overall, he is doing very well.  He has required a couple rounds of antibiotics in the last couple of months, but this is secondary to a cellulitis on his right lower extremity.  He has been having a lot of weight loss recently which has plateaued thankfully.  It appears that his pancreas has essentially failed.  He is now taking Creon enzymes orally to help supplement his digestion.  He has not started  gaining weight again, but he has stopped losing it.  Thankfully, he was finally taken seriously by his sports medicine doctor.  He tells me he had several ER visits where they attempted to brush off his concerns.  He remains on the 750 mg of acetazolamide twice daily.  Otherwise, there have been no changes to his past medical history, surgical history, family history, or social history.    Review of Systems  Constitutional: Negative.  Negative for chills, fever, malaise/fatigue and weight loss.  HENT: Negative.  Negative for congestion, ear discharge and ear pain.   Eyes:  Negative for pain, discharge and redness.  Respiratory:  Positive for cough, shortness of breath and wheezing. Negative for sputum production.   Cardiovascular: Negative.  Negative for chest pain and palpitations.  Gastrointestinal:  Negative for abdominal pain, constipation, diarrhea, heartburn, nausea and vomiting.  Skin: Negative.  Negative for itching and rash.  Neurological:  Negative for dizziness and headaches.  Endo/Heme/Allergies:  Negative for environmental allergies. Does not bruise/bleed easily.      Objective:   Blood pressure (!) 142/64, pulse 86, temperature 98.1 F (36.7 C), temperature source Temporal, resp. rate 20, height 6' (1.829 m), weight 206 lb 6.4 oz (93.6 kg), SpO2 96 %. Body mass index is 27.99 kg/m.   Physical Exam:  Physical Exam Constitutional:      Appearance: He is well-developed. He is ill-appearing.     Comments: Not speaking in full sentences.  HENT:     Head: Normocephalic and atraumatic.     Right Ear: Tympanic membrane, ear canal and external ear normal.     Left Ear: Tympanic membrane, ear canal and external ear normal.     Nose: No nasal deformity, septal deviation, mucosal edema or rhinorrhea.     Right Turbinates: Enlarged and swollen.     Left Turbinates: Enlarged and swollen.     Right Sinus: No maxillary sinus tenderness or frontal sinus tenderness.     Left  Sinus: No maxillary sinus tenderness or frontal sinus tenderness.     Mouth/Throat:     Mouth: Mucous membranes are not pale and not dry.     Pharynx: Uvula midline.  Eyes:     General: Lids are normal. No allergic shiner.       Right eye: No discharge.        Left eye: No discharge.     Conjunctiva/sclera: Conjunctivae normal.     Right eye: Right conjunctiva is not injected. No chemosis.    Left eye: Left conjunctiva is not injected. No chemosis.    Pupils: Pupils are equal, round, and reactive to light.  Cardiovascular:     Rate and Rhythm: Normal rate and regular rhythm.  Heart sounds: Normal heart sounds.  Pulmonary:     Effort: Pulmonary effort is normal. Tachypnea present. No accessory muscle usage or respiratory distress.     Breath sounds: Normal breath sounds. No wheezing, rhonchi or rales.     Comments: Decreased air movement at the bases. Chest:     Chest wall: No tenderness.  Lymphadenopathy:     Cervical: No cervical adenopathy.  Skin:    Coloration: Skin is not pale.     Findings: No abrasion, erythema, petechiae or rash. Rash is not papular, urticarial or vesicular.  Neurological:     Mental Status: He is alert.  Psychiatric:        Behavior: Behavior is cooperative.     Diagnostic studies:    Spirometry: results abnormal (FEV1: 2.37/53%, FVC: 2.44/43%, FEV1/FVC: 97%).    Spirometry consistent with possible restrictive disease. Albuterol four puffs via MDI treatment given in clinic with significant improvement in FEV1 and FVC per ATS criteria.  Allergy Studies: none        Malachi Bonds, MD  [facility]

## 2021-08-28 ENCOUNTER — Ambulatory Visit (INDEPENDENT_AMBULATORY_CARE_PROVIDER_SITE_OTHER): Payer: BC Managed Care – PPO | Admitting: Pharmacist

## 2021-08-28 VITALS — Wt 206.4 lb

## 2021-08-28 DIAGNOSIS — E1065 Type 1 diabetes mellitus with hyperglycemia: Secondary | ICD-10-CM | POA: Diagnosis not present

## 2021-08-28 LAB — POCT GLUCOSE (DEVICE FOR HOME USE): POC Glucose: 185 mg/dl — AB (ref 70–99)

## 2021-08-28 NOTE — Progress Notes (Signed)
S:     Chief Complaint  Patient presents with   Diabetes      Endocrinology provider: Dr. Fransico Michael (no upcoming appt)  Patient referred to me by Dr. Fransico Michael for insulin pump initiation and training. I have been assisting with further diabetes management per Dr. Fransico Michael and patient's preference. 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 Patient wears a t:slim X2 insulin pump and Dexcom G6 CGM. Patient was started on t:slim X2 insulin pump on 02/07/21. Patient has been followed for pump adjustments / pump education since initiation of t:slim X2 insulin pump.  At prior appt on 08/21/21, TIR was at goal > 70%. No Hypoglycemia. In the past week from 08/21/21 there were only 2 site change symbols. Patient reported this is likely due to how he doesn't press fill cartridge. I thought you had to press fill cartridge to continue to fill tubing/cannula, but patient reports he is filling tubing/cannula. Explained to patient if he is not filling tubing/cannula that can lead bad pump sites. Also, counseled thoroughly that if CGM arrow goes up after eating that this is okay. Advised him if his blood sugar does not decrease to less than 200 mg/dL after 2 hours after eating that can indicate he may require pump setting change. Advised him if his blood sugar does not decrease to less than 300 mg/dL after 2 hours this can be a bad pump site (reviewed bad pump site management; administer correction dose via Humalog Kwikpen THEN do site change). We discussed instead of putting in ghost carbs to adjust his ICR by 1 after 2 days of a pattern of hyperglycemia or hypoglycemia (increase by 1 if hypoglycemic two days in a row at the same time  after the same meal while decrease by 1 if hyperglycemia two days in a row at the same time after the same meal). Explained if he corrects for hyperglycemia two days in a row and does not notice a decrease in BG 2 hours after eating he can decrease his ISF by 5. I will closely monitor him and be available via MyChart if he has any questions  Patient presents today for follow up appt. He states he is doing well and not having any more site issues. He is not sure why - have been wearing sites in the same places and did not end up using the autosoft 30 sites.    Insurance: Development worker, international aid (Max: 3.0) 12AM 0.9   11 AM 1.1                      Total: 26.4 units   Insulin to carbohydrate ratio (ICR)  12AM 10  11AM  9                    Max Bolus: 25   Insulin Sensitivity Factor (ISF) 12AM 50                             Target BG 12AM 120                             Pump Serial Number: [id number]   Infusion Set: Autosoft sites  Infusion Set  Sites  -Patient reports infusion sites are lower back --States when he uses his thighs that they tend to bleed more --Patient reports independently doing infusion set site changes --Patient reports rotating infusion set sites Getting TruSteel and Autosoft 90 sites as of right now He has Autosoft 30 sites, but has not used them   Diet  (no changes since prior appt on 08/21/21) Patient reported dietary habits: -Breakfast (10-11am; took 3 capsule of Creon): 2 eggs, sausage link, [place], 1 slice of bread (15-16 g of carb), mustard -Lunch (~2pm): sandwich (~30 grams of carb) , mixed vegetables -Snack (sometimes, 2 capsule of Creon): nuts, [place], broccoli, fig bar -Dinner (~7:30 pm; 3 capsules of Creon): eggs, chicken, broccoli, baby carrots, 1 slice of bread (15-16 g of carb) -Snack (~12-1am, always snacks at this dinner; 2 capsule of Creon): quest protein chips, nuts, [place], fig bar, pepperoni sticks -Fast food:  none -Sweets/deserts: none -Pasta/rice: none -Fruit: 1 or 2 slices of apple, blueberries -Cereal: none -Chips/crackers: quest protein chips -Milk: none (lactose intolerant) -Soda/juice: none Now consuming more olive oil, avocado   Exercise (no changes since prior appt on 08/21/2021) Patient-reported exercise habits:  PT ([name]) 1-2x/week about 1 hours each time -He is not going on the treadmill to lower his BG readings  Monitoring: Patient reports nocturia (nighttime urination) every other day Patient reports occasional neuropathy (nerve pain) - hands/feet Patient reports visual changes (blurry vision since eye surgery). (Followed by ophthalmology; last seen by Dr. Shellia Carwin at [facility] on 02/19/21) -Per patient, Dr. Shellia Carwin, stated there was "some fluid" found and retinopathy may have worsened. Dr. Shellia Carwin advised him to follow up with retinal specialist -Has followed up with retinal specialist, Dr. Luciana Axe on 07/10/21 within [facility] and [facility]. Per patient, Dr. Luciana Axe told him there is a pocket of fluid near the retina and the retinal specialist will be monitoring this.  Patient reports self foot exams; no open cuts/wounds       O:   Labs:      Tconnect Report     Lab Results  Component Value Date   HGBA1C 6.2 (A) 08/21/2021   HGBA1C 6.3 (A) 01/30/2021   HGBA1C 7.5 (A) 12/05/2020    Lab Results  Component Value Date   CPEPTIDE <0.1 (L) 01/30/2021       Component Value Date/Time   CHOL 197 01/24/2021 0853   TRIG 111 01/24/2021 0853   HDL 40 01/24/2021 0853   CHOLHDL 4.9 01/24/2021 0853   VLDL 19 12/29/2016 1146   LDLCALC 135 (H) 01/24/2021 0853    Lab Results  Component Value Date   MICRALBCREAT 2 05/14/2020    Assessment: DM Management / education- TIR is at goal > 70%. No Hypoglycemia. He is no longer having site issues and did not end up having to try the Autosoft 30 sites. We spent 30 minutes reviewing extended bolus  feature of pump as patient has a pattern of splitting up his meal to bolus for carbs in 3-4 intervals back to back. Since he is no longer having any site issues and his BG is at goal patient will continue to follow with Dr. Fransico Michael on a regular basis and myself as needed.   Plan: Continue insulin pump settings  Educated patient thoroughly on extended boluses Monitoring:  Continue wearing Continue Dexcom G6 CGM Machael Raine has a diagnosis of diabetes, checks blood glucose readings > 4x per day, wears an insulin pump, and requires frequent adjustments to  insulin regimen. This patient will be seen every six months, minimally, to assess adherence to their CGM regimen and diabetes treatment plan. Follow Up: prn  This appointment required 60 minutes of patient care (this includes precharting, chart review, review of results, face-to-face care, etc.).   Thank you for involving clinical pharmacist/diabetes educator to assist in providing this patient's care.   Zachery Conch, PharmD, BCACP, CDCES, CPP

## 2021-08-29 ENCOUNTER — Ambulatory Visit: Payer: 59 | Admitting: Neurology

## 2021-09-03 ENCOUNTER — Encounter: Payer: Self-pay | Admitting: Allergy & Immunology

## 2021-09-03 LAB — CBC WITH DIFFERENTIAL
Basophils Absolute: 0.1 10*3/uL (ref 0.0–0.2)
Basos: 1 %
EOS (ABSOLUTE): 0.1 10*3/uL (ref 0.0–0.4)
Eos: 2 %
Hematocrit: 49.2 % (ref 37.5–51.0)
Hemoglobin: 16.1 g/dL (ref 13.0–17.7)
Immature Grans (Abs): 0 10*3/uL (ref 0.0–0.1)
Immature Granulocytes: 0 %
Lymphocytes Absolute: 1.7 10*3/uL (ref 0.7–3.1)
Lymphs: 28 %
MCH: 30.4 pg (ref 26.6–33.0)
MCHC: 32.7 g/dL (ref 31.5–35.7)
MCV: 93 fL (ref 79–97)
Monocytes Absolute: 0.6 10*3/uL (ref 0.1–0.9)
Monocytes: 10 %
Neutrophils Absolute: 3.5 10*3/uL (ref 1.4–7.0)
Neutrophils: 59 %
RBC: 5.3 x10E6/uL (ref 4.14–5.80)
RDW: 12.7 % (ref 11.6–15.4)
WBC: 6 10*3/uL (ref 3.4–10.8)

## 2021-09-03 LAB — IGE: IgE (Immunoglobulin E), Serum: 57 IU/mL (ref 6–495)

## 2021-09-09 DIAGNOSIS — F411 Generalized anxiety disorder: Secondary | ICD-10-CM | POA: Diagnosis not present

## 2021-09-10 ENCOUNTER — Other Ambulatory Visit: Payer: Self-pay

## 2021-09-10 DIAGNOSIS — G932 Benign intracranial hypertension: Secondary | ICD-10-CM | POA: Diagnosis not present

## 2021-09-10 MED ORDER — ALBUTEROL SULFATE HFA 108 (90 BASE) MCG/ACT IN AERS: 2.0000 | INHALATION_SPRAY | RESPIRATORY_TRACT | 1 refills | Status: DC | PRN

## 2021-09-10 MED ORDER — MONTELUKAST SODIUM 10 MG PO TABS: 10.0000 mg | ORAL_TABLET | Freq: Every day | ORAL | 2 refills | Status: DC

## 2021-09-12 ENCOUNTER — Encounter: Payer: Self-pay | Admitting: Dietician

## 2021-09-12 ENCOUNTER — Other Ambulatory Visit: Payer: Self-pay

## 2021-09-12 ENCOUNTER — Encounter: Payer: BC Managed Care – PPO | Attending: Family Medicine | Admitting: Dietician

## 2021-09-12 DIAGNOSIS — E1069 Type 1 diabetes mellitus with other specified complication: Secondary | ICD-10-CM | POA: Insufficient documentation

## 2021-09-12 NOTE — Patient Instructions (Addendum)
Omega 3- salmon Add variety Add beans Increase vegetables (color) 2-3 servings fruit per day Choose whole grain  Journal your meals planning  Recipe Resources: You Tube- Dicky Doe Diabetes food PodSocket.fi

## 2021-09-12 NOTE — Progress Notes (Signed)
Medical Nutrition Therapy  Appointment Start time:  1030             Appointment End time:  1110 Patient is here today alone.  He was last seen by this RD 07/25/2021 and Corrie Dandy, PharmD and CDCES on 08/28/2021. His weight is stable but he states that he has increased belly fat.  He would like to gain muscle mass. He has cut out pork sausage and changed to [place] or canadian bacon. He is walking on the treadmill daily and continues to see his PT (about every 6 weeks) and does those exercise daily. He continues to take his pancreatic enzyme 5-6 with meals and 2-3 with snacks.   Dexcom report showed Time in Range of 94% and average glucose of 150.  No lows.  Patient states that he is able to catch and correct downward trends before they are low. He is followed closely by Corrie Dandy, PharmD and CDCES and was last seen at the beginning of this month Overall he seems more confident.  States that he is running the Control IQ. Sensor reading currently 149 after 2 eggs and 1/2 slice toast.  He states that he just put on a new sensor and wanted to wait until it was functioning fully to eat more.   Referral diagnosis: pancreatic insufficiency, type 1 diabetes Preferred learning style: no preference indicated Learning readiness: ready, change in progress     NUTRITION ASSESSMENT    Anthropometrics  6' 206 lbs 07/15/2021 207 lbs 05/06/2021 205 lbs (decreased 4 lbs x 2 weeks) 208 lbs 03/12/21 and 03/18/2021 Feels best at 215 lbs UBW 230-240 lbs prior to pancrease issue     Clinical Medical Hx: Type 1 diabetes, pancreatic insufficiency, diabetic retinopathy, neuropathy, gastroparesis  Medications: see list to include Creon, Humalog, vitamin D, MVI Labs: CBG -308 (Admitted to ER for hypoglycemia, 01/30/2021) CO2 - 21 (low)  Amylase - 23 (Low) Pancreatic Elastase - 76 (Low) Notable Signs/Symptoms: weight loss 04/04/2021:  Nutrition Focused Physical Exam performed to obtain baseline.  Patient with decreased muscle  at scapula/decreased strength.  Loss of fat on upper arm. Nails/eyes WNL.   T-Slim insulin pump He is hypoglycemia unaware until glucose of 50.   He sees Dr Fransico Michael and started seeing Dr. Everardo All to have an adult endocrinologist option in the event that Dr. Fransico Michael is not available.       Lifestyle & Dietary Hx Patient lives with his parents due to his inability to walk after hospitalization in April.  He is part owner in a Manufacturing engineer business but is currently unable to work due to his deconditioned state - improved He states that his parents do not know the basics about diabetes or emergency situations related to diabetes.  He would like for them to have more information. No alcohol in the past 5 years.   Supplements: MVI, vitamin D,  Sleep: has not slept through the night in the past 6 months due to diabetes monitoring/alarms Stress / self-care: due to chronic and acute illness.  Sees [facility]ta[facility] Apo, counselor weekly.  Fear of food due to hyperglycemia Current average weekly physical activity: He started PT this week and is to go 1 hour twice per week.     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" First Meal (10-11 am) 2 eggs, 1 slice rye toast, [place] sausage, mustard, occasional avocado, occasional apple or berries Snack: none Second Meal: Frankey Poot out to eat Snack:  Third Meal ( 8 pm):  Beef and Broccoli from hot bar at grocery, rye bread, raw carrots OR salmon, steak, or [place], salad without dressing, occasional rye bread OR tacos with hard corn shells, lettuce and tomatoes Snack: occasional quest chips or egg or beef sticks but often skips Beverages: water   Estimated Energy Needs Calories: 2200 Carbohydrate: 192g Protein: 115-130g    NUTRITION DIAGNOSIS  NB-1.1 Food and nutrition-related knowledge deficit As related to adequate nutrition related to health issues .  As evidenced by diet hx and patient report.     NUTRITION INTERVENTION   Nutrition education (E-1) on the following topics:  Concerns of poor quality/interrupted sleep   Discussed adding a meal or snack at 11:00 pm to help prevent low blood glucose.  There is a long time between dinner and bedtime Encourged variety in meals and more frequent meal/snack habits Continue Creon and current medication Continue to carbohydrate count Continue low fat diet  Carbohydrate with each meal and snack   Handouts Provided Include (previous visit) Meal plan card Hypoglycemia symptoms and treatments Diabetes resources   Learning Style & Readiness for Change Teaching method utilized: Visual & Auditory  Demonstrated degree of understanding via: Teach Back  Barriers to learning/adherence to lifestyle change: stress/fear   Goals  Omega 3- salmon Add variety Add beans Increase vegetables (color) 2-3 servings fruit per day Choose whole grain  Journal your meals planning  Recipe Resources: You Tube- Dicky Doe Diabetes food PodSocket.fi     MONITORING & EVALUATION Dietary intake, weekly physical activity in 2 month.   Next Steps  Patient is to call for questions.

## 2021-09-16 ENCOUNTER — Ambulatory Visit (INDEPENDENT_AMBULATORY_CARE_PROVIDER_SITE_OTHER): Payer: BC Managed Care – PPO | Admitting: Family Medicine

## 2021-09-16 VITALS — BP 114/72 | Ht 72.0 in | Wt 206.0 lb

## 2021-09-16 DIAGNOSIS — F411 Generalized anxiety disorder: Secondary | ICD-10-CM | POA: Diagnosis not present

## 2021-09-16 DIAGNOSIS — M791 Myalgia, unspecified site: Secondary | ICD-10-CM

## 2021-09-16 NOTE — Patient Instructions (Signed)
Continue the cardio you've been doing and don't advance it at this time. Instead of the focused single joint exercises you've been doing, let's try some multijoint ones instead and see if you can make some progress on increasing muscle mass without wearing you out. Squats, lunges, bench press, maybe overhead press.  You can look into some others but I would avoid pushups, pullups. Do these without any added weight to start and at 1 set of 5-10. Increase to doing 3 sets of 10 once a day before you start adding a little weight. Follow up in about 6-8 weeks.

## 2021-09-17 ENCOUNTER — Encounter: Payer: Self-pay | Admitting: Family Medicine

## 2021-09-17 NOTE — Progress Notes (Signed)
[name] - 40 y.o. male MRN [id number]  Date of birth: 1981-07-18  SUBJECTIVE:    CC: Joint Pain  3/9: Patient reports over the past 2 months he has felt increasingly weak. Primary complaint today is feeling like his feet and hands are 'down to the bone' and it feels uncomfortable when holding the steering wheel, walking. Feels at times like he cannot hold his body weight. Of note he's had difficulty with his insulin pump in getting sites to work (better past 5 days). Relatedly he's lost about 10+ pounds over the past 1-2 months. Reports large caloric deficit taking in 800-900 calories a day over past couple months. Denies numbness or tingling in feet or hands. Microfilament testing has been normal per endo notes. He is also scheduled to see neurologist tomorrow. Has regular appointments with diabetes educator but not dietitian. Denies nausea or vomiting though appetite is not what it has been. Autonomic dysfunction noted in chart - asked him about gastroparesis and he reports history of this being mentioned remotely.   3/23: Patient reports he feels like he's worse than last visit. Biggest problem is recent stomach virus with nausea, diarrhea, abdominal pain - feels this has improved but was present for several days. Blood sugars not increasing with his carbohydrate intake - even had to turn off his basal rate because blood sugar was dropping. This morning it appears to be rising in response to diet, had no ketones this morning and basal is now back on. Stomach feels crampy. Has not seen GI in past. Started BRAT diet as recommended by Dr. Fransico Michael. Had initial visit to dietitian and doesn't follow up for about 3-4 more weeks - most discussion was general in the initial visit.   4/6: [name] comes in today very fatigued, run down. He reports he is not even running his insulin pump 90% of the time and maximum bolus he's given himself over more than a week was 1 unit of  insulin. Blood sugars still not responding to his dietary intake. He's consuming more than 2000 calories a day and has been tracking this regularly on an app. Eating variety of things but primarily eggs, sausage, bread, applesauce throughout the day. He is not sleeping because of concern about his blood sugars dropping overnight and not being surrounded by people who know how to take care of this if it were to happen. Takes more than an hour for blood sugar to stabilize when it starts to drop despite drinking sugar-containing beverages. He had EGD with Dr. Christella Hartigan after evaluation - some gastritis and biopsies sent to assess for celiac but otherwise normal test. Gastric emptying study was normal in 2019. Has a pancreatic MRI scheduled for Saturday. Reports he's not sure how long he can continue like this though denied suicidal ideation when asked directly and no plan.   5/9: Patient was admitted to the hospital after last visit and had extensive workup. Found to have exocrine pancreatic insufficiency. Some concern his frequent monitoring is causing increased distress leading to decreased sleep and anxiety. They had a discussion regarding switch from pump to basal + bolus but patient would like to continue with pump - we separately discussed this today as well. Other labwork including for celiac, thyroid dysfunction, adrenal insufficiency normal. No insulinoma on imaging. He's interested in a more specific dietary plan than he's received to date - Dr. Christella Hartigan looked into this for him and so far no luck. He continues to have site issues having to change  daily. Tried walking for exercise once - 30 minutes on treadmill and this caused quite a bit of joint pain subsequent days.  5/22: He has lost 2 pounds since last visit.  Started PT which has made him more fatigued. Reports not helping much yet.  Knee pain is bothering him the most recently. Feels like bone on bone because he has no muscle tone  to help support joints.  He is still having insulin pump and sensor problems.  He has to check his sugars every hour and dose as needed. He is even waking up hourly overnight.  Talked to a dietitian last week. His mood is down still. He does take some alprazolam at night.   7/18: Patient returns reporting improvements in his physical activity. He is able to stand for up to 30 minutes at a time now and walk on treadmill 10 minutes at a time. Has been doing some easy exercises at home but no longer going to PT. He is working with endocrinology on his CGM, site issues. He is going to see specialists in GI and endocrinology later this year at [facility]. Has notable severe exocrine pancreatic insufficiency in addition to his longstanding T1DM  8/29: Patient returns having seen [facility] Friday and having upper endoscopy - has appointment to get full results in about a week. Planning to see [facility] endocrinology too and meet with nutritionist for exocrine insufficiency at [facility]. He's not been able to advance his cardio - 10-15 minutes max on treadmill and cannot do this on back to back days. Doing strengthening exercises of hands, would like additional ones for elbow and knee pain.  10/10: [name] has been able to exercise on treadmill 10-15 minutes most days of the week now. He continues to do the home strengthening exercises for hands, elbows, and knees. Difficulty with shoulder pain as well though no acute injuries here. Has seen dietitian for both his DM and virtually one from [facility] for his pancreatic exocrine insufficiency who helped 'fill in the gaps' of his knowledge with respect to this and his creon dosage.  11/21: Overall [name] is stable compared to last visit.  He notes belly fat is increasing some with dietary changes and inability to exercise more due to fatigue. Muscle mass feels like it's decreasing still. Has been continuing using treadmill. Has list of exercises he has posted that he does  regularly following meals. Pain is in multiple joints - left shoulder, elbows, hands, knees but no swelling or warmth.  Objective:  VS: BP:114/72  HR: bpm  TEMP: ( )  RESP:   HT:6' (182.9 cm)   WT:206 lb (93.4 kg)  BMI:27.93 PHYSICAL EXAM:  Gen: NAD, comfortable in exam room  Left shoulder: No swelling, ecchymoses.  Mild scapular winging. Mild diffuse TTP. FROM. Negative Hawkins, Neers. Negative Yergasons. Strength 5/5 with empty can and resisted internal/external rotation. NV intact distally.  ASSESSMENT & PLAN: 1. Muscle weakness/atrophy - Not making a lot of progress with muscle strengthening due to fatigue.  Discussed will try to transition to multijoint exercises starting with body weight only, low reps/sets to see if he can make some progress instead of the single joint ones he has been doing.  Reviewed how to advance these.  Continue with dietitian follow-ups.  Continue with walks on treadmill but do not advance time/speed at this time.

## 2021-09-18 ENCOUNTER — Telehealth (INDEPENDENT_AMBULATORY_CARE_PROVIDER_SITE_OTHER): Payer: Self-pay | Admitting: "Endocrinology

## 2021-09-18 MED ORDER — HUMALOG 100 UNIT/ML ~~LOC~~ SOLN: SUBCUTANEOUS | 1 refills | Status: AC

## 2021-09-18 MED ORDER — INSULIN ASPART 100 UNIT/ML IJ SOLN: INTRAMUSCULAR | 3 refills | Status: AC

## 2021-09-18 NOTE — Telephone Encounter (Signed)
  Who's calling (name and relationship to patient) : Self   Best contact number:[phone]   Provider they see:DR. Fransico Michael  Reason for call: The pharmacy is not fill his script for Humalog. Patient is running really low on his insulin      PRESCRIPTION REFILL ONLY  Name of prescription: Humalog  Pharmacy: [facility] on [address] and [address]

## 2021-09-18 NOTE — Addendum Note (Signed)
Addended by: Osa Craver on: 09/18/2021 04:03 PM   Modules accepted: Orders

## 2021-09-23 DIAGNOSIS — F411 Generalized anxiety disorder: Secondary | ICD-10-CM | POA: Diagnosis not present

## 2021-09-26 ENCOUNTER — Other Ambulatory Visit: Payer: Self-pay

## 2021-09-26 ENCOUNTER — Telehealth: Payer: Self-pay | Admitting: *Deleted

## 2021-09-26 ENCOUNTER — Ambulatory Visit (INDEPENDENT_AMBULATORY_CARE_PROVIDER_SITE_OTHER): Payer: BC Managed Care – PPO | Admitting: *Deleted

## 2021-09-26 DIAGNOSIS — J4540 Moderate persistent asthma, uncomplicated: Secondary | ICD-10-CM | POA: Diagnosis not present

## 2021-09-26 MED ORDER — OMALIZUMAB 150 MG/ML ~~LOC~~ SOSY
300.0000 mg | PREFILLED_SYRINGE | SUBCUTANEOUS | Status: DC
  Administered 2021-09-26 – 2021-11-21 (×2): 300 mg via SUBCUTANEOUS

## 2021-09-26 MED ORDER — AUVI-Q 0.3 MG/0.3ML IJ SOAJ: 0.3000 mg | Freq: Once | INTRAMUSCULAR | 1 refills | Status: AC

## 2021-09-26 NOTE — Telephone Encounter (Signed)
Patient started Xolair today and after his 30 minutes he began to feel stomach upset, itching in his face, moments of rapid heart rate and nausea. He was observed by Dr. Dellis Anes and his vitals were stable. He was given Zofran before heading home. I called to check on him and he stated that since he has been home he has just felt lousy and nauseous. He states that he has no more itching and only had 1 instance of his heart racing at home.

## 2021-09-26 NOTE — Progress Notes (Addendum)
Immunotherapy   Patient Details  Name: [name] MRN: [id number] Date of Birth: 1980/12/04  09/26/2021  [name] started injections for  Xolair 300  Frequency: Every 4 Weeks  Epi-Pen: Yes Consent signed and patient instructions given. Patient started Xolair today and received 91mL in the RUA and 2mL in the LUA. Patient waited 30 minutes in office and experienced mild rapid heart rate, mild itching on his face underneath his mask, and a little GI upset. Patients heart rate did resolve before leaving the office. He did premedicate with Benadryl before coming into the office.    [name] 09/26/2021, 10:03 AM

## 2021-09-26 NOTE — Progress Notes (Signed)
After getting his Xolair today, around 30 minutes after the injection, he reported some nausea as well as some palpitations.  We did get his vitals and he had a heart rate up around 108 to 110.  He sounded good and looks fairly good.  I decided to get a spirometry and it was actually better than the one we got in early November. We did give him some Zofran for his nausea and monitored him a bit longer. He was discharged in stable condition. Return precautions provided.  Joseph Bonds, MD [facility]

## 2021-09-27 ENCOUNTER — Other Ambulatory Visit: Payer: Self-pay | Admitting: Allergy & Immunology

## 2021-09-27 DIAGNOSIS — J4551 Severe persistent asthma with (acute) exacerbation: Secondary | ICD-10-CM

## 2021-09-30 DIAGNOSIS — F411 Generalized anxiety disorder: Secondary | ICD-10-CM | POA: Diagnosis not present

## 2021-10-07 DIAGNOSIS — F411 Generalized anxiety disorder: Secondary | ICD-10-CM | POA: Diagnosis not present

## 2021-10-24 ENCOUNTER — Other Ambulatory Visit: Payer: Self-pay | Admitting: *Deleted

## 2021-10-24 ENCOUNTER — Ambulatory Visit (INDEPENDENT_AMBULATORY_CARE_PROVIDER_SITE_OTHER): Payer: BC Managed Care – PPO | Admitting: *Deleted

## 2021-10-24 ENCOUNTER — Telehealth: Payer: Self-pay | Admitting: *Deleted

## 2021-10-24 ENCOUNTER — Other Ambulatory Visit: Payer: Self-pay

## 2021-10-24 DIAGNOSIS — J4540 Moderate persistent asthma, uncomplicated: Secondary | ICD-10-CM | POA: Diagnosis not present

## 2021-10-24 MED ORDER — EPINEPHRINE 0.3 MG/0.3ML IJ SOAJ: 0.3000 mg | Freq: Once | INTRAMUSCULAR | 1 refills | Status: AC

## 2021-10-24 MED ORDER — ONDANSETRON HCL 4 MG PO TABS: ORAL_TABLET | ORAL | 3 refills | Status: DC

## 2021-10-24 MED ORDER — OMALIZUMAB 150 MG ~~LOC~~ SOLR
300.0000 mg | SUBCUTANEOUS | Status: DC
  Administered 2021-10-24: 10:00:00 300 mg via SUBCUTANEOUS

## 2021-10-24 NOTE — Telephone Encounter (Signed)
Patient received his second injection of Xolair today and experienced nausea and dry mouth and overall not feeling well while in office. He stated that he felt the same way with the last one and was nauseous for 2 days. He was given Zofran last time and Zofran and Zyrtec today, he pre medicated with Benadryl before each injection. He is wondering if he feels the same way after the 3rd injection if we can try to submit to get him switched back to Nucala? Dr. Dellis Anes did send in a Rx of Zofran for him to have on hand at home.

## 2021-10-24 NOTE — Progress Notes (Signed)
Patient presented in got his second or third Xolair.  He again developed some nausea.  This is been ongoing theme with him when he gets the Xolair.  Because of that, we are going to send in Zofran to use every 8 hours on injection days.  Malachi Bonds, MD [facility]

## 2021-10-24 NOTE — Telephone Encounter (Signed)
Called and spoke with the patient and informed. Patient verbalized understanding. He informed me in office that his insurance was not covering the [employer]. I looked and his insurance does prefer generic Epipen. I sent in a prescription for generic to his local pharmacy and informed the patient. Patient verbalized understanding.

## 2021-10-24 NOTE — Addendum Note (Signed)
Addended by: Alfonse Spruce on: 10/24/2021 10:55 AM   Modules accepted: Orders

## 2021-10-30 ENCOUNTER — Ambulatory Visit: Payer: BC Managed Care – PPO | Admitting: Family Medicine

## 2021-10-30 VITALS — BP 100/60 | Ht 72.0 in | Wt 202.0 lb

## 2021-10-30 DIAGNOSIS — E1065 Type 1 diabetes mellitus with hyperglycemia: Secondary | ICD-10-CM | POA: Diagnosis not present

## 2021-10-30 DIAGNOSIS — M791 Myalgia, unspecified site: Secondary | ICD-10-CM

## 2021-10-30 DIAGNOSIS — Z9641 Presence of insulin pump (external) (internal): Secondary | ICD-10-CM | POA: Diagnosis not present

## 2021-10-30 DIAGNOSIS — F411 Generalized anxiety disorder: Secondary | ICD-10-CM | POA: Diagnosis not present

## 2021-10-30 DIAGNOSIS — Z794 Long term (current) use of insulin: Secondary | ICD-10-CM | POA: Diagnosis not present

## 2021-10-30 NOTE — Patient Instructions (Signed)
Continue the cardio you've been doing and don't advance it at this time. Continue the short videos for strengthening also. I'll put a letter together for you and send it through mychart.

## 2021-10-31 ENCOUNTER — Encounter: Payer: Self-pay | Admitting: Family Medicine

## 2021-10-31 DIAGNOSIS — E1065 Type 1 diabetes mellitus with hyperglycemia: Secondary | ICD-10-CM | POA: Diagnosis not present

## 2021-10-31 NOTE — Progress Notes (Signed)
Joseph DingwallGabriel [name] - 41 y.o. male MRN [id number]  Date of birth: 10/31/1980  SUBJECTIVE:    CC: Joint Pain  3/9: Patient reports over the past 2 months he has felt increasingly weak. Primary complaint today is feeling like his feet and hands are 'down to the bone' and it feels uncomfortable when holding the steering wheel, walking. Feels at times like he cannot hold his body weight. Of note he's had difficulty with his insulin pump in getting sites to work (better past 5 days). Relatedly he's lost about 10+ pounds over the past 1-2 months. Reports large caloric deficit taking in 800-900 calories a day over past couple months. Denies numbness or tingling in feet or hands. Microfilament testing has been normal per endo notes. He is also scheduled to see neurologist tomorrow. Has regular appointments with diabetes educator but not dietitian. Denies nausea or vomiting though appetite is not what it has been. Autonomic dysfunction noted in chart - asked him about gastroparesis and he reports history of this being mentioned remotely.   3/23: Patient reports he feels like he's worse than last visit. Biggest problem is recent stomach virus with nausea, diarrhea, abdominal pain - feels this has improved but was present for several days. Blood sugars not increasing with his carbohydrate intake - even had to turn off his basal rate because blood sugar was dropping. This morning it appears to be rising in response to diet, had no ketones this morning and basal is now back on. Stomach feels crampy. Has not seen GI in past. Started BRAT diet as recommended by Dr. Fransico MichaelBrennan. Had initial visit to dietitian and doesn't follow up for about 3-4 more weeks - most discussion was general in the initial visit.   4/6: [name] comes in today very fatigued, run down. He reports he is not even running his insulin pump 90% of the time and maximum bolus he's given himself over more than a week was 1 unit of  insulin. Blood sugars still not responding to his dietary intake. He's consuming more than 2000 calories a day and has been tracking this regularly on an app. Eating variety of things but primarily eggs, sausage, bread, applesauce throughout the day. He is not sleeping because of concern about his blood sugars dropping overnight and not being surrounded by people who know how to take care of this if it were to happen. Takes more than an hour for blood sugar to stabilize when it starts to drop despite drinking sugar-containing beverages. He had EGD with Dr. Christella HartiganJacobs after evaluation - some gastritis and biopsies sent to assess for celiac but otherwise normal test. Gastric emptying study was normal in 2019. Has a pancreatic MRI scheduled for Saturday. Reports he's not sure how long he can continue like this though denied suicidal ideation when asked directly and no plan.   5/9: Patient was admitted to the hospital after last visit and had extensive workup. Found to have exocrine pancreatic insufficiency. Some concern his frequent monitoring is causing increased distress leading to decreased sleep and anxiety. They had a discussion regarding switch from pump to basal + bolus but patient would like to continue with pump - we separately discussed this today as well. Other labwork including for celiac, thyroid dysfunction, adrenal insufficiency normal. No insulinoma on imaging. He's interested in a more specific dietary plan than he's received to date - Dr. Christella HartiganJacobs looked into this for him and so far no luck. He continues to have site issues having to change  daily. Tried walking for exercise once - 30 minutes on treadmill and this caused quite a bit of joint pain subsequent days.  5/22: He has lost 2 pounds since last visit.  Started PT which has made him more fatigued. Reports not helping much yet.  Knee pain is bothering him the most recently. Feels like bone on bone because he has no muscle tone  to help support joints.  He is still having insulin pump and sensor problems.  He has to check his sugars every hour and dose as needed. He is even waking up hourly overnight.  Talked to a dietitian last week. His mood is down still. He does take some alprazolam at night.   7/18: Patient returns reporting improvements in his physical activity. He is able to stand for up to 30 minutes at a time now and walk on treadmill 10 minutes at a time. Has been doing some easy exercises at home but no longer going to PT. He is working with endocrinology on his CGM, site issues. He is going to see specialists in GI and endocrinology later this year at [facility]. Has notable severe exocrine pancreatic insufficiency in addition to his longstanding T1DM  8/29: Patient returns having seen [facility] Friday and having upper endoscopy - has appointment to get full results in about a week. Planning to see [facility] endocrinology too and meet with nutritionist for exocrine insufficiency at [facility]. He's not been able to advance his cardio - 10-15 minutes max on treadmill and cannot do this on back to back days. Doing strengthening exercises of hands, would like additional ones for elbow and knee pain.  10/10: [name] has been able to exercise on treadmill 10-15 minutes most days of the week now. He continues to do the home strengthening exercises for hands, elbows, and knees. Difficulty with shoulder pain as well though no acute injuries here. Has seen dietitian for both his DM and virtually one from [facility] for his pancreatic exocrine insufficiency who helped 'fill in the gaps' of his knowledge with respect to this and his creon dosage.  11/21: Overall [name] is stable compared to last visit.  He notes belly fat is increasing some with dietary changes and inability to exercise more due to fatigue. Muscle mass feels like it's decreasing still. Has been continuing using treadmill. Has list of exercises he has posted that he does  regularly following meals. Pain is in multiple joints - left shoulder, elbows, hands, knees but no swelling or warmth.  10/30/21: Patient returns without much change compared to last visit. Weight down a little compared to last visit. He's had issues primarily with asthma since that visit - gets Xolair monthly and has about 2 days of nausea, fatigue afterwards - takes zofran. He is walking on treadmill daily - splits this up at times to get 15+ minutes per day. Also discussed strengthening exercises we reviewed at last visit with a personal trainer and has some short videos they made for him that he does incorporating these. His joints ache worse at the end of the day - shoulders, hands, knees, including most others. No issues first thing in the morning. No warmth, redness, swelling he has noted of joints.  Objective:  VS: BP:100/60   HR: bpm   TEMP: ( )   RESP:    HT:6' (182.9 cm)    WT:202 lb (91.6 kg)   BMI:27.39 PHYSICAL EXAM:  Gen: NAD, comfortable in exam room  Bilateral shoulders: No swelling, ecchymoses. Mild scapular  winging. Mild diffuse TTP. FROM. Negative Hawkins, Neers. Negative Yergasons. Strength 5/5 with empty can and resisted internal/external rotation. Negative apprehension. NV intact distally.  ASSESSMENT & PLAN: 1. Muscle weakness/atrophy - He continues with treadmill for cardio, multijoint exercises from videos provided by personal trainer - encouraged to continue with these.  Had some issues with getting his creon that hopefully has been straightened out now.  Hopefully as his intake increases his ability to exercise and build muscle will also increase but this will be a long process. F/u in 6 weeks.

## 2021-11-04 DIAGNOSIS — Z9641 Presence of insulin pump (external) (internal): Secondary | ICD-10-CM | POA: Diagnosis not present

## 2021-11-04 DIAGNOSIS — R809 Proteinuria, unspecified: Secondary | ICD-10-CM | POA: Diagnosis not present

## 2021-11-04 DIAGNOSIS — E1043 Type 1 diabetes mellitus with diabetic autonomic (poly)neuropathy: Secondary | ICD-10-CM | POA: Diagnosis not present

## 2021-11-04 DIAGNOSIS — E10319 Type 1 diabetes mellitus with unspecified diabetic retinopathy without macular edema: Secondary | ICD-10-CM | POA: Diagnosis not present

## 2021-11-13 DIAGNOSIS — F411 Generalized anxiety disorder: Secondary | ICD-10-CM | POA: Diagnosis not present

## 2021-11-14 ENCOUNTER — Ambulatory Visit: Payer: BC Managed Care – PPO | Admitting: Dietician

## 2021-11-19 ENCOUNTER — Ambulatory Visit: Payer: BC Managed Care – PPO | Admitting: Dietician

## 2021-11-21 ENCOUNTER — Telehealth: Payer: Self-pay | Admitting: *Deleted

## 2021-11-21 ENCOUNTER — Ambulatory Visit (INDEPENDENT_AMBULATORY_CARE_PROVIDER_SITE_OTHER): Payer: BC Managed Care – PPO

## 2021-11-21 ENCOUNTER — Other Ambulatory Visit: Payer: Self-pay

## 2021-11-21 DIAGNOSIS — J4540 Moderate persistent asthma, uncomplicated: Secondary | ICD-10-CM | POA: Diagnosis not present

## 2021-11-21 NOTE — Telephone Encounter (Signed)
Patient came in today and received his 3rd dose of Xolair, he experienced nausea despite pre-medicating with antihistamines and Zofran. He has become very pale and developed severe nausea. Are we able to try to get the patient approved for Nucala given his symptoms with the third injection?

## 2021-11-22 ENCOUNTER — Ambulatory Visit: Payer: 59 | Admitting: Endocrinology

## 2021-11-25 NOTE — Telephone Encounter (Signed)
Called patient and advised to d/c Xolair.  Explained to patient cannot get Nucala due to no history of eos and previously had been steroid dependent when approved. Can try to get patient on Tezspire but will need documentation of Xolair failure for Ins and appt made for 2/14

## 2021-11-27 DIAGNOSIS — F411 Generalized anxiety disorder: Secondary | ICD-10-CM | POA: Diagnosis not present

## 2021-12-07 ENCOUNTER — Other Ambulatory Visit: Payer: Self-pay | Admitting: Allergy and Immunology

## 2021-12-08 ENCOUNTER — Telehealth (INDEPENDENT_AMBULATORY_CARE_PROVIDER_SITE_OTHER): Payer: Self-pay | Admitting: "Endocrinology

## 2021-12-08 NOTE — Telephone Encounter (Signed)
Mr. Rengel had me paged. He dropped his phone and the internal app for his Freestyle [name] 2 is inoperable. He asked me to call in a prescription to his local pharmacy for a separate reader to be used until he can have his phone replaced. I did so.  Molli Knock, MD, CDCES

## 2021-12-10 ENCOUNTER — Encounter: Payer: Self-pay | Admitting: Allergy and Immunology

## 2021-12-10 ENCOUNTER — Ambulatory Visit (INDEPENDENT_AMBULATORY_CARE_PROVIDER_SITE_OTHER): Payer: BC Managed Care – PPO | Admitting: Allergy and Immunology

## 2021-12-10 ENCOUNTER — Other Ambulatory Visit: Payer: Self-pay

## 2021-12-10 VITALS — BP 130/72 | HR 98 | Temp 98.3°F | Resp 16 | Ht 72.0 in | Wt 202.8 lb

## 2021-12-10 DIAGNOSIS — J3089 Other allergic rhinitis: Secondary | ICD-10-CM

## 2021-12-10 DIAGNOSIS — J4550 Severe persistent asthma, uncomplicated: Secondary | ICD-10-CM

## 2021-12-10 DIAGNOSIS — J302 Other seasonal allergic rhinitis: Secondary | ICD-10-CM

## 2021-12-10 MED ORDER — AIRDUO DIGIHALER 113-14 MCG/ACT IN AEPB: 1.0000 | INHALATION_SPRAY | Freq: Two times a day (BID) | RESPIRATORY_TRACT | 5 refills | Status: DC

## 2021-12-10 MED ORDER — MONTELUKAST SODIUM 10 MG PO TABS: 10.0000 mg | ORAL_TABLET | Freq: Every day | ORAL | 5 refills | Status: DC

## 2021-12-10 NOTE — Patient Instructions (Addendum)
°  1. Continue to Treat and prevent inflammation of airway:   A. montelukast 10 mg tablet once a day  B. AirDuo 113 - 1 inhalation 2 times per day   2. If needed:   A. nasal saline / wash  B. OTC antihistamine  C. Proair HFA / Xopenex 2 inhalations or albuterol every 4-6 hours  3. Submit for Tezepelumab administration. Has failed mepolizumab, dupilumab, omalizumab  4. Return in 12 weeks or earlier if problem

## 2021-12-10 NOTE — Progress Notes (Signed)
[place] - [place] - [place] - [place] - [name]   Follow-up Note  Referring Provider: Lucila Maine Primary Provider: Lucila Maine Date of Office Visit: 12/10/2021  Subjective:   [name] (DOB: 1981/07/25) is a 41 y.o. male who [facility] in re-evaluation of the following:  HPI: Liz [name] returns to this clinic in evaluation of asthma and allergic rhinitis.  His last visit to this clinic was for October 2022.  He has had so many problems with his biologic agents utilized for the treatment of his asthma.  He has failed mepolizumab and failed dupilumab in the past and most recently has failed omalizumab.  His most recent failure with omalizumab appeared to be not an issue with efficacy but an issue with side effects.  He developed severe nausea and constitutional symptoms after receiving omalizumab for days.  This biologic actually worked really well and his asthma is doing very well at this point in time and he does not use a short acting bronchodilator while he continues on montelukast and a combination inhaler.  He has had very little problems with his nose.  Allergies as of 12/10/2021       Reactions   Flonase [fluticasone] Shortness Of Breath   Sulfa Antibiotics    Shortness of breath, increased heart rate; "happened quite a while ago"   Molds & Smuts Other (See Comments)   Aggravate asthma   Sulfites Itching, Swelling   Tetracyclines & Related Other (See Comments)   increased intracranial pressure   Lactose Intolerance (gi) Other (See Comments)   Upset stomach   [place] Cough        Medication List    acetaZOLAMIDE 250 MG tablet Commonly known as: DIAMOX Take 250 mg by mouth 2 (two) times daily.   acetaZOLAMIDE ER 500 MG capsule Commonly known as: DIAMOX TAKE 1 CAPSULE(500 MG) BY MOUTH EVERY DAY   albuterol 108 (90 Base) MCG/ACT inhaler Commonly known as: Ventolin HFA Inhale 2  puffs into the lungs every 4 (four) hours as needed for wheezing or shortness of breath.   atorvastatin 20 MG tablet Commonly known as: LIPITOR Take 20 mg by mouth daily.   Auvi-Q 0.3 mg/0.3 mL Soaj injection Generic drug: EPINEPHrine Use as directed for life-threatening allergic reaction.   Baqsimi Two Pack 3 MG/DOSE Powd Generic drug: Glucagon Place 1 spray into the nose as directed.   Dexcom G6 Receiver Devi 1 Device by Does not apply route as directed.   Dexcom G6 Transmitter Misc Change every 90 days   FreeStyle Libre 2 Sensor Misc Inject 1 Device into the skin every 14 (fourteen) days. Place prescription on hold. To use if Dexcom CGM fails.   Dexcom G6 Sensor Misc Change sensor every 10 days   glucose blood test strip Use to check blood sugar 8 x per day. Please fill for Onetouch Verio test strips.   HumaLOG Junior KwikPen 100 UNIT/ML Generic drug: Insulin lispro Inject up to 50 units daily per provider instructions   HumaLOG 100 UNIT/ML injection Generic drug: insulin lispro Use 300 units in insulin pump every 48 hours   insulin aspart 100 UNIT/ML injection Commonly known as: novoLOG Use up to 300 units in pump every 48 hours   levocetirizine 5 MG tablet Commonly known as: Xyzal Take 1 tablet (5 mg total) by mouth daily as needed for allergies.   lipase/protease/amylase 56387 UNITS Cpep capsule Commonly known as: CREON 3  pills with every meal and 2 pills with every snack   losartan 50 MG tablet Commonly known as: COZAAR TAKE 1 TABLET(50 MG) BY MOUTH DAILY   montelukast 10 MG tablet Commonly known as: SINGULAIR TAKE 1 TABLET(10 MG) BY MOUTH AT BEDTIME. NEED OFFICE VISIT FOR MORE REFILLS.   multivitamin with minerals Tabs tablet Take 1 tablet by mouth daily.   Nucala 100 MG/ML Soaj Generic drug: Mepolizumab INJECT 100MG  SUBCUTANEOUSLY EVERY 4 WEEKS (GIVEN AT MD  OFFICE)   omeprazole 20 MG capsule Commonly known as: PRILOSEC Take 1 capsule (20 mg  total) by mouth daily.   ondansetron 4 MG tablet Commonly known as: ZOFRAN Take one tablet every 8 hours on injection days.   Pen Needles 32G X 4 MM Misc Use to inject insulin 6x per day   topiramate 50 MG tablet Commonly known as: TOPAMAX TAKE 1 TABLET(50 MG) BY MOUTH TWICE DAILY   Vitamin D (Ergocalciferol) 1.25 MG (50000 UNIT) Caps capsule Commonly known as: DRISDOL Take 50,000 Units by mouth every 7 (seven) days.    Past Medical History:  Diagnosis Date   ADHD (attention deficit hyperactivity disorder)    Anxiety    Asthma    Autonomic neuropathy due to diabetes (HCC)    Chronic headaches    Diabetes (HCC)    Fatigue    GERD (gastroesophageal reflux disease)    Goiter    High blood pressure    High cholesterol    Hypercholesterolemia    Hypertension    Hypoglycemia associated with diabetes (HCC)    Malnutrition (HCC)    Obesity    Sleep apnea    mild, no cpap   Tachycardia    Type 1 diabetes mellitus not at goal [facility])    Uncontrolled DM with microalbuminuria or microproteinuria     Past Surgical History:  Procedure Laterality Date   EYE SURGERY Right 05/30/2020   Vitrectomy, Dr. 07/30/2020   EYE SURGERY  04/2020   IR 3D INDEPENDENT WKST  07/16/2020   IR ANGIO INTRA EXTRACRAN SEL COM CAROTID INNOMINATE BILAT MOD SED  07/16/2020   IR ANGIO VERTEBRAL SEL SUBCLAVIAN INNOMINATE UNI L MOD SED  07/16/2020   IR ANGIO VERTEBRAL SEL VERTEBRAL UNI R MOD SED  07/16/2020   IR 07/18/2020 GUIDE VASC ACCESS RIGHT  07/16/2020   REFRACTIVE SURGERY     x9   WISDOM TOOTH EXTRACTION      Review of systems negative except as noted in HPI / PMHx or noted below:  Review of Systems  Constitutional: Negative.   HENT: Negative.    Eyes: Negative.   Respiratory: Negative.    Cardiovascular: Negative.   Gastrointestinal: Negative.   Genitourinary: Negative.   Musculoskeletal: Negative.   Skin: Negative.   Neurological: Negative.   Endo/Heme/Allergies: Negative.   Psychiatric/Behavioral:  Negative.      Objective:   Vitals:   12/10/21 1520  BP: 130/72  Pulse: 98  Resp: 16  Temp: 98.3 F (36.8 C)  SpO2: 97%   Height: 6' (182.9 cm)  Weight: 202 lb 12.8 oz (92 kg)   Physical Exam Constitutional:      Appearance: He is not diaphoretic.  HENT:     Head: Normocephalic.     Right Ear: Tympanic membrane, ear canal and external ear normal.     Left Ear: Tympanic membrane, ear canal and external ear normal.     Nose: Nose normal. No mucosal edema or rhinorrhea.     Mouth/Throat:  Pharynx: Uvula midline. No oropharyngeal exudate.  Eyes:     Conjunctiva/sclera: Conjunctivae normal.  Neck:     Thyroid: No thyromegaly.     Trachea: Trachea normal. No tracheal tenderness or tracheal deviation.  Cardiovascular:     Rate and Rhythm: Normal rate and regular rhythm.     Heart sounds: Normal heart sounds, S1 normal and S2 normal. No murmur heard. Pulmonary:     Effort: No respiratory distress.     Breath sounds: Normal breath sounds. No stridor. No wheezing or rales.  Lymphadenopathy:     Head:     Right side of head: No tonsillar adenopathy.     Left side of head: No tonsillar adenopathy.     Cervical: No cervical adenopathy.  Skin:    Findings: No erythema or rash.     Nails: There is no clubbing.  Neurological:     Mental Status: He is alert.    Diagnostics:    Spirometry was performed and demonstrated an FEV1 of 3.19 at 70 % of predicted.  Assessment and Plan:   1. Not well controlled severe persistent asthma   2. Seasonal and perennial allergic rhinitis    1. Continue to Treat and prevent inflammation of airway:   A. montelukast 10 mg tablet once a day  B. AirDuo 113 - 1 inhalation 2 times per day   2. If needed:   A. nasal saline / wash  B. OTC antihistamine  C. Proair HFA / Xopenex 2 inhalations or albuterol every 4-6 hours  3. Submit for Tezepelumab administration. Has failed mepolizumab, dupilumab, omalizumab  4. Return in 12 weeks or  earlier if problem   [name] needs a biologic agent to control his severe asthma and he has failed most of his other biologic agents because of lack of efficacy or because his insurance company has denied use of these medicines.  It is now time to move to an anti-TSLP antibody.  He should qualify for the use of this medication and coverage of this medication by his insurance company as he has failed all other classes of biologic agents utilized for asthma.  We will see if we can get that administration arranged sometime in the next several weeks.  Laurette Schimke, MD Allergy / Immunology [facility]

## 2021-12-11 DIAGNOSIS — F411 Generalized anxiety disorder: Secondary | ICD-10-CM | POA: Diagnosis not present

## 2021-12-11 DIAGNOSIS — Z9641 Presence of insulin pump (external) (internal): Secondary | ICD-10-CM | POA: Diagnosis not present

## 2021-12-11 DIAGNOSIS — E1040 Type 1 diabetes mellitus with diabetic neuropathy, unspecified: Secondary | ICD-10-CM | POA: Diagnosis not present

## 2021-12-11 DIAGNOSIS — K8689 Other specified diseases of pancreas: Secondary | ICD-10-CM | POA: Diagnosis not present

## 2021-12-12 DIAGNOSIS — G932 Benign intracranial hypertension: Secondary | ICD-10-CM | POA: Diagnosis not present

## 2021-12-16 ENCOUNTER — Telehealth: Payer: Self-pay | Admitting: *Deleted

## 2021-12-16 NOTE — Telephone Encounter (Signed)
Called patient and advised approval for Tezspire to replace Xolair for asthma. Copay card will be emailed to patient to provide to [facility] and submitted Rx to same

## 2021-12-19 ENCOUNTER — Ambulatory Visit: Payer: BC Managed Care – PPO

## 2021-12-25 DIAGNOSIS — F411 Generalized anxiety disorder: Secondary | ICD-10-CM | POA: Diagnosis not present

## 2021-12-25 DIAGNOSIS — J4550 Severe persistent asthma, uncomplicated: Secondary | ICD-10-CM | POA: Diagnosis not present

## 2021-12-30 ENCOUNTER — Ambulatory Visit: Payer: Self-pay

## 2021-12-30 ENCOUNTER — Ambulatory Visit: Payer: BC Managed Care – PPO | Admitting: Family Medicine

## 2021-12-30 VITALS — BP 118/78 | Ht 72.0 in | Wt 202.0 lb

## 2021-12-30 DIAGNOSIS — M79662 Pain in left lower leg: Secondary | ICD-10-CM | POA: Diagnosis not present

## 2021-12-30 DIAGNOSIS — M79661 Pain in right lower leg: Secondary | ICD-10-CM

## 2021-12-30 NOTE — Patient Instructions (Signed)
We will go ahead with an MRI of your left shin given the continue pain here and concerning findings on your ultrasound suggesting at least old stress fractures. ?I will call you with results and next steps. ?Continue tylenol, ibuprofen. ?Use pain as your guide for activities. ?Don't try to break a record for high jump right now. ?I don't think we need to do any lab work based on what you've had done previously though. ?

## 2021-12-31 ENCOUNTER — Other Ambulatory Visit: Payer: Self-pay

## 2021-12-31 ENCOUNTER — Encounter: Payer: Self-pay | Admitting: Family Medicine

## 2021-12-31 ENCOUNTER — Ambulatory Visit
Admission: RE | Admit: 2021-12-31 | Discharge: 2021-12-31 | Disposition: A | Discharge status: Home / Self Care | Payer: BC Managed Care – PPO | Source: Ambulatory Visit | Attending: Family Medicine | Admitting: Family Medicine

## 2021-12-31 ENCOUNTER — Ambulatory Visit (INDEPENDENT_AMBULATORY_CARE_PROVIDER_SITE_OTHER): Payer: BC Managed Care – PPO | Admitting: *Deleted

## 2021-12-31 DIAGNOSIS — M79662 Pain in left lower leg: Secondary | ICD-10-CM

## 2021-12-31 DIAGNOSIS — J4540 Moderate persistent asthma, uncomplicated: Secondary | ICD-10-CM

## 2021-12-31 MED ORDER — TEZEPELUMAB-EKKO 210 MG/1.91ML ~~LOC~~ SOSY
210.0000 mg | PREFILLED_SYRINGE | SUBCUTANEOUS | Status: DC
  Administered 2021-12-31: 210 mg via SUBCUTANEOUS

## 2021-12-31 NOTE — Progress Notes (Signed)
°  [name] - 41 y.o. male MRN [id number]  Date of birth: [birth year]  SUBJECTIVE:    CC: Joint Pain  10/30/21: Patient returns without much change compared to last visit. Weight down a little compared to last visit. He's had issues primarily with asthma since that visit - gets Xolair monthly and has about 2 days of nausea, fatigue afterwards - takes zofran. He is walking on treadmill daily - splits this up at times to get 15+ minutes per day. Also discussed strengthening exercises we reviewed at last visit with a personal trainer and has some short videos they made for him that he does incorporating these. His joints ache worse at the end of the day - shoulders, hands, knees, including most others. No issues first thing in the morning. No warmth, redness, swelling he has noted of joints.  3/6: Patient returns with primary concern being lower extremity pain. Mostly felt in bilateral shins though with knee, ankle pain anteriorly also. Worse on the left. No swelling or bruising. Has continued to do his home exercises and videos. Pain worse with ambulation over past several weeks, better with rest. Taking ibuprofen 1-2 tabs at nighttime with mild benefit.  Objective:  VS: BP:118/78   HR: bpm   TEMP: ( )   RESP:    HT:6' (182.9 cm)    WT:202 lb (91.6 kg)   BMI:27.39 PHYSICAL EXAM:  Gen: NAD, comfortable in exam room  Bilateral lower legs: No deformity, swelling, bruising. FROM with 5/5 strength ankles and knees. Tenderness to palpation over anterior tibias bilaterally.  No gastroc, other muscle tenderness. NVI distally. Unable to perform hop test due to pain.  Limited MSK u/s bilateral lower extremities:  Gastroc and soleus muscles normal.  Left tibia with 4 different areas of cortical irregularity anterior tibia without overlying edema or neovascularity.  Pain with probe compression this area of tibia.  Right tibia with 1 area of cortical irregularity similar to  above.  ASSESSMENT & PLAN: Bilateral lower extremity pain - patient's focal bony pain, worse with ambulation, inability to perform hop test concerning for at least stress reactions of tibia, moreso on left.  Cortical irregularities of tibia concerning for old healed tibia stress fractures.  Complicated by his Will obtain radiographs to assess but then go ahead with MRI left tibia/fibula.  Tylenol, ibuprofen if needed.

## 2021-12-31 NOTE — Progress Notes (Signed)
Immunotherapy ? ? ?Patient Details  ?Name: [name] ?MRN: [id number] ?Date of Birth: July 08, 1981 ? ?12/31/2021 ? ?[name] started injections for  [employer]. ? ?Frequency: Every 4 weeks  ?Epi-Pen:Epi-Pen Available  ?Consent signed and patient instructions given. ? ?Patient started tezspire today and received 1.26mL in the left upper arm. Patient waited 30 minutes in office and did not experience any issues.  ? ? ?Orson Aloe ?12/31/2021, 9:48 AM ? ? ?

## 2022-01-13 ENCOUNTER — Ambulatory Visit
Admission: RE | Admit: 2022-01-13 | Discharge: 2022-01-13 | Disposition: A | Discharge status: Home / Self Care | Payer: BC Managed Care – PPO | Source: Ambulatory Visit | Attending: Family Medicine | Admitting: Family Medicine

## 2022-01-13 DIAGNOSIS — M79662 Pain in left lower leg: Secondary | ICD-10-CM

## 2022-01-14 DIAGNOSIS — F411 Generalized anxiety disorder: Secondary | ICD-10-CM | POA: Diagnosis not present

## 2022-01-22 DIAGNOSIS — F411 Generalized anxiety disorder: Secondary | ICD-10-CM | POA: Diagnosis not present

## 2022-01-22 DIAGNOSIS — J4550 Severe persistent asthma, uncomplicated: Secondary | ICD-10-CM | POA: Diagnosis not present

## 2022-01-24 ENCOUNTER — Other Ambulatory Visit (INDEPENDENT_AMBULATORY_CARE_PROVIDER_SITE_OTHER): Payer: Self-pay | Admitting: "Endocrinology

## 2022-01-24 DIAGNOSIS — E1069 Type 1 diabetes mellitus with other specified complication: Secondary | ICD-10-CM

## 2022-01-27 DIAGNOSIS — G932 Benign intracranial hypertension: Secondary | ICD-10-CM | POA: Diagnosis not present

## 2022-01-28 ENCOUNTER — Ambulatory Visit (INDEPENDENT_AMBULATORY_CARE_PROVIDER_SITE_OTHER): Payer: BC Managed Care – PPO | Admitting: Allergy and Immunology

## 2022-01-28 ENCOUNTER — Encounter: Payer: Self-pay | Admitting: Allergy and Immunology

## 2022-01-28 ENCOUNTER — Ambulatory Visit: Payer: BC Managed Care – PPO | Admitting: Allergy and Immunology

## 2022-01-28 VITALS — BP 118/78 | HR 96 | Temp 98.3°F | Resp 16 | Ht 72.0 in | Wt 203.8 lb

## 2022-01-28 DIAGNOSIS — J3089 Other allergic rhinitis: Secondary | ICD-10-CM | POA: Diagnosis not present

## 2022-01-28 DIAGNOSIS — J4550 Severe persistent asthma, uncomplicated: Secondary | ICD-10-CM

## 2022-01-28 NOTE — Progress Notes (Signed)
? ?[place] ? ? ?Follow-up Note ? ?Referring Provider: Teena IraniSpencer, [name], PA-C ?Primary Provider: Teena IraniSpencer, [name], PA-C ?Date of Office Visit: 01/28/2022 ? ?Subjective:  ? ?Joseph DingwallGabriel [name] (DOB: 07/21/1981) is a 41 y.o. male who [facility] in re-evaluation of the following: ? ?HPI: [name] returns to this clinic in evaluation of asthma and allergic rhinitis.  I have not seen him in this clinic since 10 December 2021. ? ?We gave him an injection of anti-TSLP antibody during his last visit and unfortunately he developed side effects regarding his blood sugar control and apparently some injection site bleeding with placement of his needles for insulin infusion.  And the administration of anti-TSLP antibody did not help his asthma and he still has lots of shortness of breath and wheezing and uses a bronchodilator about 1 time per day or so while he continues on combination inhalers and montelukast. ? ?Allergies as of 01/28/2022   ? ?   Reactions  ? Flonase [fluticasone] Shortness Of Breath  ? Sulfa Antibiotics   ? Shortness of breath, increased heart rate; "happened quite a while ago"  ? Molds & Smuts Other (See Comments)  ? Aggravate asthma  ? Sulfites Itching, Swelling  ? Tetracyclines & Related Other (See Comments)  ? increased intracranial pressure  ? Lactose Nausea Only  ? Lactose Intolerance (gi) Diarrhea, Nausea And Vomiting  ? Upset stomach  ? White Birch Cough  ? ?  ? ?  ?Medication List  ? ? ?acetaZOLAMIDE ER 500 MG capsule ?Commonly known as: DIAMOX ?625 mg. ?  ?AirDuo Digihaler 113-14 MCG/ACT Aepb ?Generic drug: Fluticasone-Salmeterol(sensor) ?Inhale 1 puff into the lungs 2 (two) times daily. ?  ?albuterol 108 (90 Base) MCG/ACT inhaler ?Commonly known as: Ventolin HFA ?Inhale 2 puffs into the lungs every 4 (four) hours as needed for wheezing or shortness of breath. ?  ?atorvastatin 20 MG tablet ?Commonly known as:  LIPITOR ?Take 20 mg by mouth daily. ?  ?Auvi-Q 0.3 mg/0.3 mL Soaj injection ?Generic drug: EPINEPHrine ?Use as directed for life-threatening allergic reaction. ?  ?Baqsimi Two Pack 3 MG/DOSE Powd ?Generic drug: Glucagon ?Place 1 spray into the nose as directed. ?  ?Dexcom G6 Receiver Devi ?1 Device by Does not apply route as directed. ?  ?Dexcom G6 Transmitter Misc ?Change every 90 days ?  ?FreeStyle Libre 2 Sensor Misc ?Inject 1 Device into the skin every 14 (fourteen) days. Place prescription on hold. To use if Dexcom CGM fails. ?  ?Dexcom G6 Sensor Misc ?Change sensor every 10 days ?  ?HumaLOG Junior KwikPen 100 UNIT/ML ?Generic drug: Insulin lispro ?Inject up to 50 units daily per provider instructions ?  ?HumaLOG 100 UNIT/ML injection ?Generic drug: insulin lispro ?Use 300 units in insulin pump every 48 hours ?  ?insulin aspart 100 UNIT/ML injection ?Commonly known as: novoLOG ?Use up to 300 units in pump every 48 hours ?  ?levocetirizine 5 MG tablet ?Commonly known as: Xyzal ?Take 1 tablet (5 mg total) by mouth daily as needed for allergies. ?  ?lipase/protease/amylase [id number] UNITS Cpep capsule ?Commonly known as: CREON ?3 pills with every meal and 2 pills with every snack ?  ?losartan 50 MG tablet ?Commonly known as: COZAAR ?TAKE 1 TABLET(50 MG) BY MOUTH DAILY ?  ?montelukast 10 MG tablet ?Commonly known as: SINGULAIR ?Take 1 tablet (10 mg total) by mouth at bedtime. ?  ?multivitamin with minerals Tabs tablet ?Take  1 tablet by mouth daily. ?  ?omeprazole 20 MG capsule ?Commonly known as: PRILOSEC ?Take 1 capsule (20 mg total) by mouth daily. ?  ?ondansetron 4 MG tablet ?Commonly known as: ZOFRAN ?Take one tablet every 8 hours on injection days. ?  ?OneTouch Verio test strip ?Generic drug: glucose blood ?TEST BLOOD SUGAR 8 TIMES DAILY ?  ?Pen Needles 32G X 4 MM Misc ?Use to inject insulin 6x per day ?  ?topiramate 50 MG tablet ?Commonly known as: TOPAMAX ?TAKE 1 TABLET(50 MG) BY MOUTH TWICE DAILY ?  ?Vitamin D  (Ergocalciferol) 1.25 MG (50000 UNIT) Caps capsule ?Commonly known as: DRISDOL ?Take 50,000 Units by mouth every 7 (seven) days. ?  ? ?Past Medical History:  ?Diagnosis Date  ? ADHD (attention deficit hyperactivity disorder)   ? Anxiety   ? Asthma   ? Autonomic neuropathy due to diabetes [facility])   ? Chronic headaches   ? Diabetes (HCC)   ? Fatigue   ? GERD (gastroesophageal reflux disease)   ? Goiter   ? High blood pressure   ? High cholesterol   ? Hypercholesterolemia   ? Hypertension   ? Hypoglycemia associated with diabetes (HCC)   ? Malnutrition (HCC)   ? Obesity   ? Sleep apnea   ? mild, no cpap  ? Tachycardia   ? Type 1 diabetes mellitus not at goal [facility])   ? Uncontrolled DM with microalbuminuria or microproteinuria   ? ? ?Past Surgical History:  ?Procedure Laterality Date  ? EYE SURGERY Right 05/30/2020  ? Vitrectomy, Dr. Luciana Axe  ? EYE SURGERY  04/2020  ? IR 3D INDEPENDENT WKST  07/16/2020  ? IR ANGIO INTRA EXTRACRAN SEL COM CAROTID INNOMINATE BILAT MOD SED  07/16/2020  ? IR ANGIO VERTEBRAL SEL SUBCLAVIAN INNOMINATE UNI L MOD SED  07/16/2020  ? IR ANGIO VERTEBRAL SEL VERTEBRAL UNI R MOD SED  07/16/2020  ? IR US GUIDE VASC ACCESS RIGHT  07/16/2020  ? REFRACTIVE SURGERY    ? x9  ? WISDOM TOOTH EXTRACTION    ? ? ?Review of systems negative except as noted in HPI / PMHx or noted below: ? ?Review of Systems  ?Constitutional: Negative.   ?HENT: Negative.    ?Eyes: Negative.   ?Respiratory: Negative.    ?Cardiovascular: Negative.   ?Gastrointestinal: Negative.   ?Genitourinary: Negative.   ?Musculoskeletal: Negative.   ?Skin: Negative.   ?Neurological: Negative.   ?Endo/Heme/Allergies: Negative.   ?Psychiatric/Behavioral: Negative.    ? ? ?Objective:  ? ?Vitals:  ? 01/28/22 1127  ?BP: 118/78  ?Pulse: 96  ?Resp: 16  ?Temp: 98.3 ?F (36.8 ?C)  ?SpO2: 97%  ? ?Height: 6' (182.9 cm)  ?Weight: 203 lb 12.8 oz (92.4 kg)  ? ?Physical Exam ?Constitutional:   ?   Appearance: He is not diaphoretic.  ?HENT:  ?   Head: Normocephalic.  ?    Right Ear: Tympanic membrane, ear canal and external ear normal.  ?   Left Ear: Tympanic membrane, ear canal and external ear normal.  ?   Nose: Nose normal. No mucosal edema or rhinorrhea.  ?   Mouth/Throat:  ?   Pharynx: Uvula midline. No oropharyngeal exudate.  ?Eyes:  ?   Conjunctiva/sclera: Conjunctivae normal.  ?Neck:  ?   Thyroid: No thyromegaly.  ?   Trachea: Trachea normal. No tracheal tenderness or tracheal deviation.  ?Cardiovascular:  ?   Rate and Rhythm: Normal rate and regular rhythm.  ?   Heart sounds: Normal heart sounds, S1 normal  and S2 normal. No murmur heard. ?Pulmonary:  ?   Effort: No respiratory distress.  ?   Breath sounds: Normal breath sounds. No stridor. No wheezing or rales.  ?Lymphadenopathy:  ?   Head:  ?   Right side of head: No tonsillar adenopathy.  ?   Left side of head: No tonsillar adenopathy.  ?   Cervical: No cervical adenopathy.  ?Skin: ?   Findings: No erythema or rash.  ?   Nails: There is no clubbing.  ?Neurological:  ?   Mental Status: He is alert.  ? ? ?Diagnostics: none ? ?Assessment and Plan:  ? ?1. Not well controlled severe persistent asthma   ?2. Seasonal and perennial allergic rhinitis   ? ? ?1. Continue to Treat and prevent inflammation of airway: ? ? A. montelukast 10 mg tablet once a day ? B. AirDuo 113 - 1 inhalation 2 times per day ?  ?2. If needed: ? ? A. nasal saline / wash ? B. OTC antihistamine ? C. Proair HFA / Xopenex 2 inhalations or albuterol every 4-6 hours ? ?3. Submit for Mepolizumab administration. Has failed benralizumab, dupilumab, omalizumab ? ?4. Return in 6 months or earlier if problem ?  ?We will once again petition [name] insurance company to have him use mepolizumab.  Mepolizumab is the only biologic agent that has given rise to good control of his asthma without the development of side effects.  He has failed benralizumab, dupilumab, and omalizumab. ? ?Laurette Schimke, MD ?Allergy / Immunology ? [facility] ?

## 2022-01-28 NOTE — Patient Instructions (Addendum)
?  1. Continue to Treat and prevent inflammation of airway: ? ? A. montelukast 10 mg tablet once a day ? B. AirDuo 113 - 1 inhalation 2 times per day ?  ?2. If needed: ? ? A. nasal saline / wash ? B. OTC antihistamine ? C. Proair HFA / Xopenex 2 inhalations or albuterol every 4-6 hours ? ?3. Submit for Mepolizumab administration. Has failed benralizumab, dupilumab, omalizumab ? ?4. Return in 6 months or earlier if problem ?  ?  ? ?  ?  ? ? ? ?

## 2022-01-29 ENCOUNTER — Encounter: Payer: Self-pay | Admitting: Allergy and Immunology

## 2022-01-29 DIAGNOSIS — Z9641 Presence of insulin pump (external) (internal): Secondary | ICD-10-CM | POA: Diagnosis not present

## 2022-01-29 DIAGNOSIS — F411 Generalized anxiety disorder: Secondary | ICD-10-CM | POA: Diagnosis not present

## 2022-01-29 DIAGNOSIS — Z794 Long term (current) use of insulin: Secondary | ICD-10-CM | POA: Diagnosis not present

## 2022-01-29 DIAGNOSIS — E1065 Type 1 diabetes mellitus with hyperglycemia: Secondary | ICD-10-CM | POA: Diagnosis not present

## 2022-01-29 DIAGNOSIS — E1069 Type 1 diabetes mellitus with other specified complication: Secondary | ICD-10-CM | POA: Diagnosis not present

## 2022-01-30 DIAGNOSIS — E109 Type 1 diabetes mellitus without complications: Secondary | ICD-10-CM | POA: Diagnosis not present

## 2022-01-30 DIAGNOSIS — G912 (Idiopathic) normal pressure hydrocephalus: Secondary | ICD-10-CM | POA: Diagnosis not present

## 2022-01-30 DIAGNOSIS — J4550 Severe persistent asthma, uncomplicated: Secondary | ICD-10-CM | POA: Diagnosis not present

## 2022-01-30 DIAGNOSIS — E559 Vitamin D deficiency, unspecified: Secondary | ICD-10-CM | POA: Diagnosis not present

## 2022-02-04 ENCOUNTER — Telehealth: Payer: Self-pay | Admitting: *Deleted

## 2022-02-04 ENCOUNTER — Other Ambulatory Visit (INDEPENDENT_AMBULATORY_CARE_PROVIDER_SITE_OTHER): Payer: Self-pay | Admitting: "Endocrinology

## 2022-02-04 DIAGNOSIS — E1065 Type 1 diabetes mellitus with hyperglycemia: Secondary | ICD-10-CM

## 2022-02-04 NOTE — Telephone Encounter (Signed)
-----   Message from Jessica Priest, MD sent at 01/28/2022 12:15 PM EDT ----- ?3. Submit for Mepolizumab administration. Has failed benralizumab, dupilumab, omalizumab ? ?

## 2022-02-04 NOTE — Telephone Encounter (Signed)
Called patient and advised approval and submit to [facility] for Nucala. Did advise him to check with [facility] to make sure copay card info up to date and he can provide same for $0 copay ?

## 2022-02-05 DIAGNOSIS — Z1329 Encounter for screening for other suspected endocrine disorder: Secondary | ICD-10-CM | POA: Diagnosis not present

## 2022-02-05 DIAGNOSIS — F411 Generalized anxiety disorder: Secondary | ICD-10-CM | POA: Diagnosis not present

## 2022-02-06 DIAGNOSIS — Z9641 Presence of insulin pump (external) (internal): Secondary | ICD-10-CM | POA: Diagnosis not present

## 2022-02-06 DIAGNOSIS — E1043 Type 1 diabetes mellitus with diabetic autonomic (poly)neuropathy: Secondary | ICD-10-CM | POA: Diagnosis not present

## 2022-02-08 ENCOUNTER — Other Ambulatory Visit (INDEPENDENT_AMBULATORY_CARE_PROVIDER_SITE_OTHER): Payer: Self-pay | Admitting: "Endocrinology

## 2022-02-10 DIAGNOSIS — E1040 Type 1 diabetes mellitus with diabetic neuropathy, unspecified: Secondary | ICD-10-CM | POA: Diagnosis not present

## 2022-02-12 ENCOUNTER — Ambulatory Visit: Payer: BC Managed Care – PPO | Admitting: Family Medicine

## 2022-02-12 VITALS — BP 110/74 | Ht 72.0 in | Wt 202.0 lb

## 2022-02-12 DIAGNOSIS — M791 Myalgia, unspecified site: Secondary | ICD-10-CM

## 2022-02-12 DIAGNOSIS — M7741 Metatarsalgia, right foot: Secondary | ICD-10-CM | POA: Diagnosis not present

## 2022-02-12 NOTE — Patient Instructions (Signed)
I do think it's worth pursuing transitioning from the pump and CGM to monitoring and treating off of these like you had discussed with Dr. Tamala Julian. ?Continue activity level as you have currently and the exercises but don't advance right now. ?Keep in touch with [facility] about both pancreatic and islet cell transplant possibilities in the meantime. ?These metatarsal pads should be replaced at least every 6 months. ?Follow up with me in about 6-8 weeks. ?

## 2022-02-13 DIAGNOSIS — F411 Generalized anxiety disorder: Secondary | ICD-10-CM | POA: Diagnosis not present

## 2022-02-14 ENCOUNTER — Encounter: Payer: Self-pay | Admitting: Family Medicine

## 2022-02-14 NOTE — Progress Notes (Signed)
PCP: [name], PA-C ? ?Subjective:  ? ?HPI: ?Patient is a 41 y.o. male here for joint pain. ? ?Patient continues at current level of physical activity, walking on treadmill and doing exercise videos. ?Still with poor energy level, pain and fatigue, muscle weakness. ?Considering switch from his CGM, pump as he's had a lot of lability of his blood sugars and concerned about lows. ?Site issues still as well. ?Seen at [facility] endo and being referred to consider pancreas and islet cell transplant possibilities. ? ?Past Medical History:  ?Diagnosis Date  ? ADHD (attention deficit hyperactivity disorder)   ? Anxiety   ? Asthma   ? Autonomic neuropathy due to diabetes [facility])   ? Chronic headaches   ? Diabetes (HCC)   ? Fatigue   ? GERD (gastroesophageal reflux disease)   ? Goiter   ? High blood pressure   ? High cholesterol   ? Hypercholesterolemia   ? Hypertension   ? Hypoglycemia associated with diabetes (HCC)   ? Malnutrition (HCC)   ? Obesity   ? Sleep apnea   ? mild, no cpap  ? Tachycardia   ? Type 1 diabetes mellitus not at goal [facility])   ? Uncontrolled DM with microalbuminuria or microproteinuria   ? ? ?Current Outpatient Medications on File Prior to Visit  ?Medication Sig Dispense Refill  ? acetaZOLAMIDE ER (DIAMOX) 500 MG capsule 625 mg.    ? albuterol (VENTOLIN HFA) 108 (90 Base) MCG/ACT inhaler Inhale 2 puffs into the lungs every 4 (four) hours as needed for wheezing or shortness of breath. 18 g 1  ? atorvastatin (LIPITOR) 20 MG tablet Take 20 mg by mouth daily.   0  ? AUVI-Q 0.3 MG/0.3ML SOAJ injection Use as directed for life-threatening allergic reaction. 2 each 2  ? BAQSIMI TWO PACK 3 MG/DOSE POWD USE 1 SPRAY INTO THE NOSE AS DIRECTED 2 each 3  ? Continuous Blood Gluc Receiver (DEXCOM G6 RECEIVER) DEVI 1 Device by Does not apply route as directed. 1 each 2  ? Continuous Blood Gluc Sensor (DEXCOM G6 SENSOR) MISC Change sensor every 10 days 3 each 11  ? Continuous Blood Gluc Sensor (FREESTYLE LIBRE 2 SENSOR)  MISC Inject 1 Device into the skin every 14 (fourteen) days. Place prescription on hold. To use if Dexcom CGM fails. 2 each 11  ? Continuous Blood Gluc Transmit (DEXCOM G6 TRANSMITTER) MISC Change every 90 days 1 each 3  ? Fluticasone-Salmeterol,sensor, (AIRDUO DIGIHALER) 113-14 MCG/ACT AEPB Inhale 1 puff into the lungs 2 (two) times daily. 1 each 5  ? HUMALOG 100 UNIT/ML injection Use 300 units in insulin pump every 48 hours 120 mL 1  ? insulin aspart (NOVOLOG) 100 UNIT/ML injection Use up to 300 units in pump every 48 hours 120 mL 3  ? Insulin Lispro Junior KwikPen (HUMALOG JR) 100 UNIT/ML KwikPen ADMINISTER UP TO 50 UNITS UNDER THE SKIN DAILY AS DIRECTED BY PRESCRIBER 15 mL 11  ? Insulin Pen Needle (PEN NEEDLES) 32G X 4 MM MISC Use to inject insulin 6x per day 200 each 5  ? levocetirizine (XYZAL) 5 MG tablet Take 1 tablet (5 mg total) by mouth daily as needed for allergies. 30 tablet 5  ? lipase/protease/amylase (CREON) 36000 UNITS CPEP capsule 3 pills with every meal and 2 pills with every snack 390 capsule 11  ? losartan (COZAAR) 50 MG tablet TAKE 1 TABLET(50 MG) BY MOUTH DAILY 90 tablet 1  ? montelukast (SINGULAIR) 10 MG tablet Take 1 tablet (10 mg  total) by mouth at bedtime. 30 tablet 5  ? Multiple Vitamin (MULTIVITAMIN WITH MINERALS) TABS tablet Take 1 tablet by mouth daily.    ? omeprazole (PRILOSEC) 20 MG capsule Take 1 capsule (20 mg total) by mouth daily. 30 capsule 11  ? ondansetron (ZOFRAN) 4 MG tablet Take one tablet every 8 hours on injection days. 20 tablet 3  ? ONETOUCH VERIO test strip TEST BLOOD SUGAR 8 TIMES DAILY 250 strip 3  ? topiramate (TOPAMAX) 50 MG tablet TAKE 1 TABLET(50 MG) BY MOUTH TWICE DAILY 60 tablet 5  ? Vitamin D, Ergocalciferol, (DRISDOL) 1.25 MG (50000 UNIT) CAPS capsule Take 50,000 Units by mouth every 7 (seven) days.    ? ?Current Facility-Administered Medications on File Prior to Visit  ?Medication Dose Route Frequency Provider Last Rate Last Admin  ? gadopentetate  dimeglumine (MAGNEVIST) injection 20 mL  20 mL Intravenous Once PRN Dohmeier, Porfirio Mylararmen, MD      ? Mepolizumab SOLR 100 mg  100 mg Subcutaneous Q28 days Alfonse SpruceGallagher, Joel Louis, MD   100 mg at 02/16/19 1018  ? omalizumab Geoffry Paradise(XOLAIR) injection 300 mg  300 mg Subcutaneous Q28 days Jessica PriestKozlow, Eric J, MD   300 mg at 10/24/21 1018  ? omalizumab Geoffry Paradise(XOLAIR) prefilled syringe 300 mg  300 mg Subcutaneous Q28 days Jessica PriestKozlow, Eric J, MD   300 mg at 11/21/21 [id number]  ? tezepelumab-ekko (TEZSPIRE) 210 MG/1.91ML syringe 210 mg  210 mg Subcutaneous Q28 days Jessica PriestKozlow, Eric J, MD   210 mg at 12/31/21 0946  ? ? ?Past Surgical History:  ?Procedure Laterality Date  ? EYE SURGERY Right 05/30/2020  ? Vitrectomy, Dr. Luciana Axeankin  ? EYE SURGERY  04/2020  ? IR 3D INDEPENDENT WKST  07/16/2020  ? IR ANGIO INTRA EXTRACRAN SEL COM CAROTID INNOMINATE BILAT MOD SED  07/16/2020  ? IR ANGIO VERTEBRAL SEL SUBCLAVIAN INNOMINATE UNI L MOD SED  07/16/2020  ? IR ANGIO VERTEBRAL SEL VERTEBRAL UNI R MOD SED  07/16/2020  ? IR US GUIDE VASC ACCESS RIGHT  07/16/2020  ? REFRACTIVE SURGERY    ? x9  ? WISDOM TOOTH EXTRACTION    ? ? ?Allergies  ?Allergen Reactions  ? Flonase [Fluticasone] Shortness Of Breath  ? Sulfa Antibiotics   ?  Shortness of breath, increased heart rate; "happened quite a while ago"  ? Molds & Smuts Other (See Comments)  ?  Aggravate asthma  ? Sulfites Itching and Swelling  ? Tetracyclines & Related Other (See Comments)  ?  increased intracranial pressure  ? Lactose Nausea Only  ? Lactose Intolerance (Gi) Diarrhea and Nausea And Vomiting  ?  Upset stomach  ? [place] Cough  ? ? ?BP 110/74   Ht 6' (1.829 m)   Wt 202 lb (91.6 kg)   BMI 27.40 kg/m?  ? ? ?  09/11/2020  ?  9:45 AM 06/24/2021  ? 10:44 AM 08/05/2021  ? 10:00 AM 09/16/2021  ? 10:08 AM  ?[facility]  ?Frequency of aerobic exercise (# of days/week) 5 3 3 3   ?Average time in minutes 30 15 15 15   ?Frequency of strengthening activities (# of days/week) 0 1 1 1   ? ? ?   ? View : No data  to display.  ?  ?  ?  ? ? ?    ?Objective:  ?Physical Exam: ? ?Gen: NAD, comfortable in exam room, appears fatigued. ?  ?Assessment & Plan:  ?1. Joint pain - regarding his joint, muscle pain recent MRI of tib/fib  was negative for stress fracture.  Poor nutritional status playing a large role in his pain, fatigue, inability to exercise beyond current level.  Advised to discuss transition off CGM and pump with Dr. Katrinka Blazing further as they discussed last visit.  He will follow up with [facility] endo and their referral for possible transplant.  Continue current activity level in meantime.  We did place new metatarsal pads in his shoes for his metatarsalgia.  F/u in 6-8 weeks. ?

## 2022-02-17 DIAGNOSIS — E785 Hyperlipidemia, unspecified: Secondary | ICD-10-CM | POA: Diagnosis not present

## 2022-02-17 DIAGNOSIS — E109 Type 1 diabetes mellitus without complications: Secondary | ICD-10-CM | POA: Diagnosis not present

## 2022-02-17 DIAGNOSIS — I10 Essential (primary) hypertension: Secondary | ICD-10-CM | POA: Diagnosis not present

## 2022-02-19 DIAGNOSIS — F411 Generalized anxiety disorder: Secondary | ICD-10-CM | POA: Diagnosis not present

## 2022-02-26 DIAGNOSIS — F411 Generalized anxiety disorder: Secondary | ICD-10-CM | POA: Diagnosis not present

## 2022-02-26 DIAGNOSIS — E1043 Type 1 diabetes mellitus with diabetic autonomic (poly)neuropathy: Secondary | ICD-10-CM | POA: Diagnosis not present

## 2022-03-03 ENCOUNTER — Other Ambulatory Visit: Payer: Self-pay | Admitting: Neurology

## 2022-03-12 ENCOUNTER — Other Ambulatory Visit (HOSPITAL_COMMUNITY): Payer: Self-pay | Admitting: Interventional Radiology

## 2022-03-12 DIAGNOSIS — I771 Stricture of artery: Secondary | ICD-10-CM

## 2022-03-12 DIAGNOSIS — F411 Generalized anxiety disorder: Secondary | ICD-10-CM | POA: Diagnosis not present

## 2022-03-18 ENCOUNTER — Encounter: Payer: Self-pay | Admitting: Allergy and Immunology

## 2022-03-18 ENCOUNTER — Ambulatory Visit (INDEPENDENT_AMBULATORY_CARE_PROVIDER_SITE_OTHER): Payer: BC Managed Care – PPO | Admitting: Allergy and Immunology

## 2022-03-18 VITALS — BP 114/78 | HR 78 | Temp 98.1°F | Resp 16 | Ht 72.0 in | Wt 200.8 lb

## 2022-03-18 DIAGNOSIS — J302 Other seasonal allergic rhinitis: Secondary | ICD-10-CM

## 2022-03-18 DIAGNOSIS — J4550 Severe persistent asthma, uncomplicated: Secondary | ICD-10-CM

## 2022-03-18 DIAGNOSIS — J3089 Other allergic rhinitis: Secondary | ICD-10-CM | POA: Diagnosis not present

## 2022-03-18 MED ORDER — EPINEPHRINE 0.3 MG/0.3ML IJ SOAJ
INTRAMUSCULAR | 2 refills | Status: AC
Start: 2022-03-18 — End: ?

## 2022-03-18 MED ORDER — ALBUTEROL SULFATE HFA 108 (90 BASE) MCG/ACT IN AERS: 2.0000 | INHALATION_SPRAY | RESPIRATORY_TRACT | 1 refills | Status: DC | PRN

## 2022-03-18 MED ORDER — MONTELUKAST SODIUM 10 MG PO TABS: 10.0000 mg | ORAL_TABLET | Freq: Every day | ORAL | 5 refills | Status: DC

## 2022-03-18 MED ORDER — LEVOCETIRIZINE DIHYDROCHLORIDE 5 MG PO TABS: 5.0000 mg | ORAL_TABLET | Freq: Every day | ORAL | 5 refills | Status: DC | PRN

## 2022-03-18 MED ORDER — AIRDUO DIGIHALER 113-14 MCG/ACT IN AEPB: 1.0000 | INHALATION_SPRAY | Freq: Two times a day (BID) | RESPIRATORY_TRACT | 5 refills | Status: DC

## 2022-03-18 NOTE — Patient Instructions (Addendum)
  1. Continue to Treat and prevent inflammation of airway:   A. montelukast 10 mg tablet once a day  B. AirDuo 113 - 1 inhalation 2 times per day  C. Mepolizumab injections monthly   2. If needed:   A. nasal saline / wash  B. OTC antihistamine  C. Proair HFA / Xopenex 2 inhalations or albuterol every 4-6 hours  3. Return in 6 months or earlier if problem

## 2022-03-18 NOTE — Progress Notes (Unsigned)
[place] [facility] [place] [facility] [place] [facility] [place] [facility] [name]   Follow-up Note   Referring Provider: Lucila Maine Primary Provider: Lucila Maine Date of Office Visit: 03/18/2022  Subjective:   [name] (DOB: 02/23/1981) is a 41 y.o. male who [facility] in re-evaluation of the following:  HPI: [name] returns to this clinic in evaluation of asthma and allergic rhinitis.  His last visit to this clinic was 30 November 2021.  He has really done well.  He has received 1 Nucala injection and his breathing is already better.  He does not use a short acting bronchodilator.  He continues to use a combination inhaler.  He had very little issues with his nose.  He is being evaluated for pancreas transplant at [facility].  Allergies as of 03/18/2022       Reactions   Flonase [fluticasone] Shortness Of Breath   Sulfa Antibiotics    Shortness of breath, increased heart rate; "happened quite a while ago"   Molds & Smuts Other (See Comments)   Aggravate asthma   Sulfites Itching, Swelling   Tetracyclines & Related Other (See Comments)   increased intracranial pressure   Lactose Nausea Only   Lactose Intolerance (gi) Diarrhea, Nausea And Vomiting   Upset stomach   White Birch Cough        Medication List    acetaZOLAMIDE ER 500 MG capsule Commonly known as: DIAMOX 625 mg.   AirDuo Digihaler 113-14 MCG/ACT Aepb Generic drug: Fluticasone-Salmeterol(sensor) Inhale 1 puff into the lungs 2 (two) times daily.   albuterol 108 (90 Base) MCG/ACT inhaler Commonly known as: Ventolin HFA Inhale 2 puffs into the lungs every 4 (four) hours as needed for wheezing or shortness of breath.   atorvastatin 20 MG tablet Commonly known as: LIPITOR Take 20 mg by mouth daily.   Auvi-Q 0.3 mg/0.3 mL Soaj injection Generic drug: EPINEPHrine Use as directed for life-threatening allergic reaction.   Baqsimi Two Pack 3 MG/DOSE  Powd Generic drug: Glucagon USE 1 SPRAY INTO THE NOSE AS DIRECTED   Dexcom G6 Receiver Devi 1 Device by Does not apply route as directed.   Dexcom G6 Transmitter Misc Change every 90 days   FreeStyle Libre 2 Sensor Misc Inject 1 Device into the skin every 14 (fourteen) days. Place prescription on hold. To use if Dexcom CGM fails.   Dexcom G6 Sensor Misc Change sensor every 10 days   HumaLOG 100 UNIT/ML injection Generic drug: insulin lispro Use 300 units in insulin pump every 48 hours   Insulin Lispro Junior KwikPen 100 UNIT/ML KwikPen Commonly known as: HUMALOG JR ADMINISTER UP TO 50 UNITS UNDER THE SKIN DAILY AS DIRECTED BY PRESCRIBER   insulin aspart 100 UNIT/ML injection Commonly known as: novoLOG Use up to 300 units in pump every 48 hours   levocetirizine 5 MG tablet Commonly known as: Xyzal Take 1 tablet (5 mg total) by mouth daily as needed for allergies.   lipase/protease/amylase 75449 UNITS Cpep capsule Commonly known as: CREON 3 pills with every meal and 2 pills with every snack   losartan 50 MG tablet Commonly known as: COZAAR TAKE 1 TABLET(50 MG) BY MOUTH DAILY   montelukast 10 MG tablet Commonly known as: SINGULAIR Take 1 tablet (10 mg total) by mouth at bedtime.   multivitamin with minerals Tabs tablet Take 1 tablet by mouth daily.   omeprazole 20 MG capsule Commonly known as: PRILOSEC Take 1 capsule (20  mg total) by mouth daily.   ondansetron 4 MG tablet Commonly known as: ZOFRAN Take one tablet every 8 hours on injection days.   OneTouch Verio test strip Generic drug: glucose blood TEST BLOOD SUGAR 8 TIMES DAILY   Pen Needles 32G X 4 MM Misc Use to inject insulin 6x per day   topiramate 50 MG tablet Commonly known as: TOPAMAX Take 1 tablet (50 mg total) by mouth 2 (two) times daily. Please call and make overdue appt for further refills. 1st attempt   Vitamin D (Ergocalciferol) 1.25 MG (50000 UNIT) Caps capsule Commonly known as:  DRISDOL Take 50,000 Units by mouth every 7 (seven) days.    Past Medical History:  Diagnosis Date   ADHD (attention deficit hyperactivity disorder)    Anxiety    Asthma    Autonomic neuropathy due to diabetes (HCC)    Chronic headaches    Diabetes (HCC)    Fatigue    GERD (gastroesophageal reflux disease)    Goiter    High blood pressure    High cholesterol    Hypercholesterolemia    Hypertension    Hypoglycemia associated with diabetes (HCC)    Malnutrition (HCC)    Obesity    Sleep apnea    mild, no cpap   Tachycardia    Type 1 diabetes mellitus not at goal [facility](HCC)    Uncontrolled DM with microalbuminuria or microproteinuria     Past Surgical History:  Procedure Laterality Date   EYE SURGERY Right 05/30/2020   Vitrectomy, Dr. Luciana Axeankin   EYE SURGERY  04/2020   IR 3D INDEPENDENT WKST  07/16/2020   IR ANGIO INTRA EXTRACRAN SEL COM CAROTID INNOMINATE BILAT MOD SED  07/16/2020   IR ANGIO VERTEBRAL SEL SUBCLAVIAN INNOMINATE UNI L MOD SED  07/16/2020   IR ANGIO VERTEBRAL SEL VERTEBRAL UNI R MOD SED  07/16/2020   IR US GUIDE VASC ACCESS RIGHT  07/16/2020   REFRACTIVE SURGERY     x9   WISDOM TOOTH EXTRACTION      Review of systems negative except as noted in HPI / PMHx or noted below:  Review of Systems  Constitutional: Negative.   HENT: Negative.    Eyes: Negative.   Respiratory: Negative.    Cardiovascular: Negative.   Gastrointestinal: Negative.   Genitourinary: Negative.   Musculoskeletal: Negative.   Skin: Negative.   Neurological: Negative.   Endo/Heme/Allergies: Negative.   Psychiatric/Behavioral: Negative.      Objective:   Vitals:   03/18/22 1527  BP: 114/78  Pulse: 78  Resp: 16  Temp: 98.1 F (36.7 C)  SpO2: 99%   Height: 6' (182.9 cm)  Weight: 200 lb 12.8 oz (91.1 kg)   Physical Exam Constitutional:      Appearance: He is not diaphoretic.  HENT:     Head: Normocephalic.     Right Ear: Tympanic membrane, ear canal and external ear normal.      Left Ear: Tympanic membrane, ear canal and external ear normal.     Nose: Nose normal. No mucosal edema or rhinorrhea.     Mouth/Throat:     Pharynx: Uvula midline. No oropharyngeal exudate.  Eyes:     Conjunctiva/sclera: Conjunctivae normal.  Neck:     Thyroid: No thyromegaly.     Trachea: Trachea normal. No tracheal tenderness or tracheal deviation.  Cardiovascular:     Rate and Rhythm: Normal rate and regular rhythm.     Heart sounds: Normal heart sounds, S1 normal and S2 normal. No  murmur heard. Pulmonary:     Effort: No respiratory distress.     Breath sounds: Normal breath sounds. No stridor. No wheezing or rales.  Lymphadenopathy:     Head:     Right side of head: No tonsillar adenopathy.     Left side of head: No tonsillar adenopathy.     Cervical: No cervical adenopathy.  Skin:    Findings: No erythema or rash.     Nails: There is no clubbing.  Neurological:     Mental Status: He is alert.    Diagnostics:    Spirometry was performed and demonstrated an FEV1 of 2.99 at 67 % of predicted.  Assessment and Plan:   1. Not well controlled severe persistent asthma   2. Seasonal and perennial allergic rhinitis    1. Continue to Treat and prevent inflammation of airway:   A. montelukast 10 mg tablet once a day  B. AirDuo 113 - 1 inhalation 2 times per day  C. Mepolizumab injections monthly   2. If needed:   A. nasal saline / wash  B. OTC antihistamine  C. Proair HFA / Xopenex 2 inhalations or albuterol every 4-6 hours  3. Return in 6 months or earlier if problem   [name] will continue to use anti-IL-5 monoclonal antibody along with anti-inflammatory agents for his airway as noted above to address his asthma and his atopic nasal issue and we will see him back in this clinic in 6 months or earlier if there is a problem.  Laurette Schimke, MD Allergy / Immunology  [facility]

## 2022-03-19 ENCOUNTER — Encounter: Payer: Self-pay | Admitting: Allergy and Immunology

## 2022-03-19 DIAGNOSIS — F411 Generalized anxiety disorder: Secondary | ICD-10-CM | POA: Diagnosis not present

## 2022-03-20 ENCOUNTER — Telehealth: Payer: Self-pay

## 2022-03-20 DIAGNOSIS — R9431 Abnormal electrocardiogram [ECG] [EKG]: Secondary | ICD-10-CM | POA: Diagnosis not present

## 2022-03-20 DIAGNOSIS — Z8659 Personal history of other mental and behavioral disorders: Secondary | ICD-10-CM | POA: Diagnosis not present

## 2022-03-20 DIAGNOSIS — E1039 Type 1 diabetes mellitus with other diabetic ophthalmic complication: Secondary | ICD-10-CM | POA: Diagnosis not present

## 2022-03-20 DIAGNOSIS — H5350 Unspecified color vision deficiencies: Secondary | ICD-10-CM | POA: Diagnosis not present

## 2022-03-20 DIAGNOSIS — Z111 Encounter for screening for respiratory tuberculosis: Secondary | ICD-10-CM | POA: Diagnosis not present

## 2022-03-20 DIAGNOSIS — Z1159 Encounter for screening for other viral diseases: Secondary | ICD-10-CM | POA: Diagnosis not present

## 2022-03-20 DIAGNOSIS — G932 Benign intracranial hypertension: Secondary | ICD-10-CM | POA: Diagnosis not present

## 2022-03-20 DIAGNOSIS — E11311 Type 2 diabetes mellitus with unspecified diabetic retinopathy with macular edema: Secondary | ICD-10-CM | POA: Diagnosis not present

## 2022-03-20 DIAGNOSIS — E7800 Pure hypercholesterolemia, unspecified: Secondary | ICD-10-CM | POA: Diagnosis not present

## 2022-03-20 DIAGNOSIS — Z01818 Encounter for other preprocedural examination: Secondary | ICD-10-CM | POA: Diagnosis not present

## 2022-03-20 DIAGNOSIS — K8681 Exocrine pancreatic insufficiency: Secondary | ICD-10-CM | POA: Diagnosis not present

## 2022-03-20 DIAGNOSIS — Z7682 Awaiting organ transplant status: Secondary | ICD-10-CM | POA: Diagnosis not present

## 2022-03-20 DIAGNOSIS — Z9641 Presence of insulin pump (external) (internal): Secondary | ICD-10-CM | POA: Diagnosis not present

## 2022-03-20 DIAGNOSIS — J45909 Unspecified asthma, uncomplicated: Secondary | ICD-10-CM | POA: Diagnosis not present

## 2022-03-20 DIAGNOSIS — E10319 Type 1 diabetes mellitus with unspecified diabetic retinopathy without macular edema: Secondary | ICD-10-CM | POA: Diagnosis not present

## 2022-03-20 DIAGNOSIS — Z114 Encounter for screening for human immunodeficiency virus [HIV]: Secondary | ICD-10-CM | POA: Diagnosis not present

## 2022-03-20 NOTE — Telephone Encounter (Signed)
Patient called office to see if he could receive a call back concerning [facility], thanks!

## 2022-03-25 ENCOUNTER — Ambulatory Visit (HOSPITAL_COMMUNITY)
Admission: RE | Admit: 2022-03-25 | Discharge: 2022-03-25 | Disposition: A | Discharge status: Home / Self Care | Payer: BC Managed Care – PPO | Source: Ambulatory Visit | Attending: Interventional Radiology | Admitting: Interventional Radiology

## 2022-03-25 DIAGNOSIS — I771 Stricture of artery: Secondary | ICD-10-CM | POA: Diagnosis not present

## 2022-03-25 DIAGNOSIS — K115 Sialolithiasis: Secondary | ICD-10-CM | POA: Diagnosis not present

## 2022-03-25 DIAGNOSIS — I6612 Occlusion and stenosis of left anterior cerebral artery: Secondary | ICD-10-CM | POA: Diagnosis not present

## 2022-03-25 DIAGNOSIS — I6522 Occlusion and stenosis of left carotid artery: Secondary | ICD-10-CM | POA: Diagnosis not present

## 2022-03-25 LAB — POCT I-STAT CREATININE: Creatinine, Ser: 1 mg/dL (ref 0.61–1.24)

## 2022-03-25 MED ORDER — IOHEXOL 350 MG/ML SOLN
75.0000 mL | Freq: Once | INTRAVENOUS | Status: AC | PRN
  Administered 2022-03-25: 75 mL via INTRAVENOUS

## 2022-03-26 ENCOUNTER — Ambulatory Visit: Payer: BC Managed Care – PPO | Admitting: Family Medicine

## 2022-03-26 ENCOUNTER — Encounter: Payer: Self-pay | Admitting: Family Medicine

## 2022-03-26 ENCOUNTER — Telehealth (HOSPITAL_COMMUNITY): Payer: Self-pay

## 2022-03-26 VITALS — BP 104/72 | Ht 72.0 in | Wt 199.0 lb

## 2022-03-26 DIAGNOSIS — F411 Generalized anxiety disorder: Secondary | ICD-10-CM | POA: Diagnosis not present

## 2022-03-26 DIAGNOSIS — M791 Myalgia, unspecified site: Secondary | ICD-10-CM

## 2022-03-26 DIAGNOSIS — L989 Disorder of the skin and subcutaneous tissue, unspecified: Secondary | ICD-10-CM

## 2022-03-26 NOTE — Patient Instructions (Signed)
I will write you a letter in support of the transplant. We will refer you to dermatology for the scaly lesion on your right shin. Message or call me if there are any lab results that you need help interpreting. Go ahead with the switch to injections (old school!). Follow up with me in 6 weeks but message me sooner if you need anything.

## 2022-03-26 NOTE — Telephone Encounter (Signed)
Patient seen for office visit 5/31 - see that office note for details.

## 2022-03-26 NOTE — Progress Notes (Signed)
PCP: [name], PA-C  Subjective:   HPI: Patient is a 41 y.o. male here for follow-up.  Patient with no real changes with respect to physical activities. He has visited with [facility] transplant team and undergoing labwork for possible pancreatic transplant - sense he received was that they are 50/50 on whether or not to recommend he go ahead with the transplant. [name] understands the risks and it would greatly improve his quality of life with how much trouble he's had with sites, hypoglycemic episodes. He is to receive a call from them following labwork to see what next steps are and if any additional labwork is needed. He has a scaly lesion on his right shin - believes this is a site where he struck his shin about 2 years ago but this area persists - does not have a dermatologist.  Wound itself healed after this though took a while.  Past Medical History:  Diagnosis Date   ADHD (attention deficit hyperactivity disorder)    Anxiety    Asthma    Autonomic neuropathy due to diabetes (HCC)    Chronic headaches    Diabetes (HCC)    Fatigue    GERD (gastroesophageal reflux disease)    Goiter    High blood pressure    High cholesterol    Hypercholesterolemia    Hypertension    Hypoglycemia associated with diabetes (HCC)    Malnutrition (HCC)    Obesity    Sleep apnea    mild, no cpap   Tachycardia    Type 1 diabetes mellitus not at goal [facility])    Uncontrolled DM with microalbuminuria or microproteinuria     Current Outpatient Medications on File Prior to Visit  Medication Sig Dispense Refill   acetaZOLAMIDE ER (DIAMOX) 500 MG capsule 625 mg.     albuterol (VENTOLIN HFA) 108 (90 Base) MCG/ACT inhaler Inhale 2 puffs into the lungs every 4 (four) hours as needed for wheezing or shortness of breath. 18 g 1   atorvastatin (LIPITOR) 20 MG tablet Take 20 mg by mouth daily.   0   BAQSIMI TWO PACK 3 MG/DOSE POWD USE 1 SPRAY INTO THE NOSE AS DIRECTED 2 each 3   Continuous Blood Gluc  Receiver (DEXCOM G6 RECEIVER) DEVI 1 Device by Does not apply route as directed. 1 each 2   Continuous Blood Gluc Sensor (DEXCOM G6 SENSOR) MISC Change sensor every 10 days 3 each 11   Continuous Blood Gluc Sensor (FREESTYLE LIBRE 2 SENSOR) MISC Inject 1 Device into the skin every 14 (fourteen) days. Place prescription on hold. To use if Dexcom CGM fails. 2 each 11   Continuous Blood Gluc Transmit (DEXCOM G6 TRANSMITTER) MISC Change every 90 days 1 each 3   EPINEPHrine (AUVI-Q) 0.3 mg/0.3 mL IJ SOAJ injection Use as directed for life-threatening allergic reaction. 2 each 2   Fluticasone-Salmeterol,sensor, (AIRDUO DIGIHALER) 113-14 MCG/ACT AEPB Inhale 1 puff into the lungs 2 (two) times daily. 1 each 5   HUMALOG 100 UNIT/ML injection Use 300 units in insulin pump every 48 hours 120 mL 1   insulin aspart (NOVOLOG) 100 UNIT/ML injection Use up to 300 units in pump every 48 hours 120 mL 3   Insulin Lispro Junior KwikPen (HUMALOG JR) 100 UNIT/ML KwikPen ADMINISTER UP TO 50 UNITS UNDER THE SKIN DAILY AS DIRECTED BY PRESCRIBER 15 mL 11   Insulin Pen Needle (PEN NEEDLES) 32G X 4 MM MISC Use to inject insulin 6x per day 200 each 5   levocetirizine (  XYZAL) 5 MG tablet Take 1 tablet (5 mg total) by mouth daily as needed for allergies (Can take an extra dose during flare ups.). 60 tablet 5   lipase/protease/amylase (CREON) 36000 UNITS CPEP capsule 3 pills with every meal and 2 pills with every snack 390 capsule 11   losartan (COZAAR) 50 MG tablet TAKE 1 TABLET(50 MG) BY MOUTH DAILY 90 tablet 1   montelukast (SINGULAIR) 10 MG tablet Take 1 tablet (10 mg total) by mouth at bedtime. 30 tablet 5   Multiple Vitamin (MULTIVITAMIN WITH MINERALS) TABS tablet Take 1 tablet by mouth daily.     NUCALA 100 MG/ML SOAJ Inject 100 mg into the skin every 28 (twenty-eight) days.     omeprazole (PRILOSEC) 20 MG capsule Take 1 capsule (20 mg total) by mouth daily. 30 capsule 11   ondansetron (ZOFRAN) 4 MG tablet Take one tablet  every 8 hours on injection days. 20 tablet 3   ONETOUCH VERIO test strip TEST BLOOD SUGAR 8 TIMES DAILY 250 strip 3   topiramate (TOPAMAX) 50 MG tablet Take 1 tablet (50 mg total) by mouth 2 (two) times daily. Please call and make overdue appt for further refills. 1st attempt 30 tablet 0   Vitamin D, Ergocalciferol, (DRISDOL) 1.25 MG (50000 UNIT) CAPS capsule Take 50,000 Units by mouth every 7 (seven) days.     Current Facility-Administered Medications on File Prior to Visit  Medication Dose Route Frequency Provider Last Rate Last Admin   gadopentetate dimeglumine (MAGNEVIST) injection 20 mL  20 mL Intravenous Once PRN Dohmeier, Porfirio Mylararmen, MD       Mepolizumab SOLR 100 mg  100 mg Subcutaneous Q28 days Alfonse SpruceGallagher, Joel Louis, MD   100 mg at 02/16/19 1018   omalizumab Geoffry Paradise(XOLAIR) injection 300 mg  300 mg Subcutaneous Q28 days Jessica PriestKozlow, Eric J, MD   300 mg at 10/24/21 1018   omalizumab Geoffry Paradise(XOLAIR) prefilled syringe 300 mg  300 mg Subcutaneous Q28 days Jessica PriestKozlow, Eric J, MD   300 mg at 11/21/21 0956   tezepelumab-ekko (TEZSPIRE) 210 MG/1.91ML syringe 210 mg  210 mg Subcutaneous Q28 days Jessica PriestKozlow, Eric J, MD   210 mg at 12/31/21 [id number]    Past Surgical History:  Procedure Laterality Date   EYE SURGERY Right 05/30/2020   Vitrectomy, Dr. Luciana Axeankin   EYE SURGERY  04/2020   IR 3D INDEPENDENT WKST  07/16/2020   IR ANGIO INTRA EXTRACRAN SEL COM CAROTID INNOMINATE BILAT MOD SED  07/16/2020   IR ANGIO VERTEBRAL SEL SUBCLAVIAN INNOMINATE UNI L MOD SED  07/16/2020   IR ANGIO VERTEBRAL SEL VERTEBRAL UNI R MOD SED  07/16/2020   IR US GUIDE VASC ACCESS RIGHT  07/16/2020   REFRACTIVE SURGERY     x9   WISDOM TOOTH EXTRACTION      Allergies  Allergen Reactions   Flonase [Fluticasone] Shortness Of Breath   Sulfa Antibiotics     Shortness of breath, increased heart rate; "happened quite a while ago"   Molds & Smuts Other (See Comments)    Aggravate asthma   Sulfites Itching and Swelling   Tetracyclines & Related Other (See  Comments)    increased intracranial pressure   Lactose Nausea Only   Lactose Intolerance (Gi) Diarrhea and Nausea And Vomiting    Upset stomach   White [place]ch Cough    BP 104/72   Ht 6' (1.829 m)   Wt 199 lb (90.3 kg)   BMI 26.99 kg/m      09/11/2020    9:45 AM  06/24/2021   10:44 AM 08/05/2021   10:00 AM 09/16/2021   10:08 AM  [facility] Adult Exercise  Frequency of aerobic exercise (# of days/week) 5 3 3 3   Average time in minutes 30 15 15 15   Frequency of strengthening activities (# of days/week) 0 1 1 1         View : No data to display.              Objective:  Physical Exam:  Gen: NAD, comfortable in exam room  Right shin:  Scaly lesion anterior right shin.  No telangiectasia with this or ulceration.  He has a smaller similar area lower on the shin.   Assessment & Plan:  1. Type 1 Diabetes - being evaluated for possible full pancreas transplant.  Will write letter in support of this for him.  Also plans to come off pump and switch to injection therapy as discussed last visit and with Dr. .    2. Right lower extremity lesion(s) - scaly.  Will refer to dermatology for evaluation.

## 2022-03-26 NOTE — Telephone Encounter (Signed)
Pt agreed to f/u in 1 year with a cta head/neck. AW  

## 2022-03-27 ENCOUNTER — Other Ambulatory Visit (INDEPENDENT_AMBULATORY_CARE_PROVIDER_SITE_OTHER): Payer: Self-pay | Admitting: "Endocrinology

## 2022-03-27 DIAGNOSIS — E1069 Type 1 diabetes mellitus with other specified complication: Secondary | ICD-10-CM

## 2022-04-02 DIAGNOSIS — F411 Generalized anxiety disorder: Secondary | ICD-10-CM | POA: Diagnosis not present

## 2022-04-03 DIAGNOSIS — E109 Type 1 diabetes mellitus without complications: Secondary | ICD-10-CM | POA: Diagnosis not present

## 2022-04-05 ENCOUNTER — Other Ambulatory Visit: Payer: Self-pay | Admitting: Neurology

## 2022-04-09 ENCOUNTER — Encounter (INDEPENDENT_AMBULATORY_CARE_PROVIDER_SITE_OTHER): Payer: Self-pay | Admitting: Ophthalmology

## 2022-04-09 ENCOUNTER — Ambulatory Visit (INDEPENDENT_AMBULATORY_CARE_PROVIDER_SITE_OTHER): Payer: BC Managed Care – PPO | Admitting: Ophthalmology

## 2022-04-09 DIAGNOSIS — E103553 Type 1 diabetes mellitus with stable proliferative diabetic retinopathy, bilateral: Secondary | ICD-10-CM | POA: Diagnosis not present

## 2022-04-09 DIAGNOSIS — E103513 Type 1 diabetes mellitus with proliferative diabetic retinopathy with macular edema, bilateral: Secondary | ICD-10-CM

## 2022-04-09 DIAGNOSIS — E103551 Type 1 diabetes mellitus with stable proliferative diabetic retinopathy, right eye: Secondary | ICD-10-CM

## 2022-04-09 DIAGNOSIS — E103511 Type 1 diabetes mellitus with proliferative diabetic retinopathy with macular edema, right eye: Secondary | ICD-10-CM

## 2022-04-09 DIAGNOSIS — G932 Benign intracranial hypertension: Secondary | ICD-10-CM

## 2022-04-09 DIAGNOSIS — H43822 Vitreomacular adhesion, left eye: Secondary | ICD-10-CM | POA: Diagnosis not present

## 2022-04-09 DIAGNOSIS — F411 Generalized anxiety disorder: Secondary | ICD-10-CM | POA: Diagnosis not present

## 2022-04-09 DIAGNOSIS — H2513 Age-related nuclear cataract, bilateral: Secondary | ICD-10-CM

## 2022-04-09 DIAGNOSIS — E103552 Type 1 diabetes mellitus with stable proliferative diabetic retinopathy, left eye: Secondary | ICD-10-CM

## 2022-04-09 DIAGNOSIS — E103512 Type 1 diabetes mellitus with proliferative diabetic retinopathy with macular edema, left eye: Secondary | ICD-10-CM

## 2022-04-09 NOTE — Progress Notes (Signed)
04/09/2022     CHIEF COMPLAINT Patient presents for  Chief Complaint  Patient presents with   Diabetic Retinopathy without Macular Edema      HISTORY OF PRESENT ILLNESS: [name] is a 41 y.o. male who presents to the clinic today for:   HPI   Patient has a recent pancreatic insufficiency no interval change in acuity per patient report Last edited by Edmon Crape, MD on 04/09/2022 10:37 AM.      Referring physician: [name], PA-C [address] [place],  [place] [address]  HISTORICAL INFORMATION:   Selected notes from the MEDICAL RECORD NUMBER    Lab Results  Component Value Date   HGBA1C 6.2 (A) 08/21/2021     CURRENT MEDICATIONS: No current outpatient medications on file. (Ophthalmic Drugs)   No current facility-administered medications for this visit. (Ophthalmic Drugs)   Current Outpatient Medications (Other)  Medication Sig   acetaZOLAMIDE ER (DIAMOX) 500 MG capsule 625 mg.   albuterol (VENTOLIN HFA) 108 (90 Base) MCG/ACT inhaler Inhale 2 puffs into the lungs every 4 (four) hours as needed for wheezing or shortness of breath.   atorvastatin (LIPITOR) 20 MG tablet Take 20 mg by mouth daily.    BAQSIMI TWO PACK 3 MG/DOSE POWD USE 1 SPRAY INTO THE NOSE AS DIRECTED   Continuous Blood Gluc Receiver (DEXCOM G6 RECEIVER) DEVI 1 Device by Does not apply route as directed.   Continuous Blood Gluc Sensor (DEXCOM G6 SENSOR) MISC Change sensor every 10 days   Continuous Blood Gluc Sensor (FREESTYLE LIBRE 2 SENSOR) MISC USE EVERY 14 DAYS. USE IF DEXCOM FAILS   Continuous Blood Gluc Transmit (DEXCOM G6 TRANSMITTER) MISC Change every 90 days   EPINEPHrine (AUVI-Q) 0.3 mg/0.3 mL IJ SOAJ injection Use as directed for life-threatening allergic reaction.   Fluticasone-Salmeterol,sensor, (AIRDUO DIGIHALER) 113-14 MCG/ACT AEPB Inhale 1 puff into the lungs 2 (two) times daily.   HUMALOG 100 UNIT/ML injection Use 300 units in insulin pump every 48 hours    insulin aspart (NOVOLOG) 100 UNIT/ML injection Use up to 300 units in pump every 48 hours   Insulin Lispro Junior KwikPen (HUMALOG JR) 100 UNIT/ML KwikPen ADMINISTER UP TO 50 UNITS UNDER THE SKIN DAILY AS DIRECTED BY PRESCRIBER   Insulin Pen Needle (PEN NEEDLES) 32G X 4 MM MISC Use to inject insulin 6x per day   levocetirizine (XYZAL) 5 MG tablet Take 1 tablet (5 mg total) by mouth daily as needed for allergies (Can take an extra dose during flare ups.).   lipase/protease/amylase (CREON) 36000 UNITS CPEP capsule 3 pills with every meal and 2 pills with every snack   losartan (COZAAR) 50 MG tablet TAKE 1 TABLET(50 MG) BY MOUTH DAILY   montelukast (SINGULAIR) 10 MG tablet Take 1 tablet (10 mg total) by mouth at bedtime.   Multiple Vitamin (MULTIVITAMIN WITH MINERALS) TABS tablet Take 1 tablet by mouth daily.   NUCALA 100 MG/ML SOAJ Inject 100 mg into the skin every 28 (twenty-eight) days.   omeprazole (PRILOSEC) 20 MG capsule Take 1 capsule (20 mg total) by mouth daily.   ondansetron (ZOFRAN) 4 MG tablet Take one tablet every 8 hours on injection days.   ONETOUCH VERIO test strip TEST BLOOD SUGAR 8 TIMES DAILY   topiramate (TOPAMAX) 50 MG tablet Take 1 tablet (50 mg total) by mouth 2 (two) times daily. Please call and make overdue appt for further refills. 2nd attempt   Vitamin D, Ergocalciferol, (DRISDOL) 1.25 MG (50000 UNIT)  CAPS capsule Take 50,000 Units by mouth every 7 (seven) days.   Current Facility-Administered Medications (Other)  Medication Route   Mepolizumab SOLR 100 mg Subcutaneous   omalizumab Geoffry Paradise(XOLAIR) injection 300 mg Subcutaneous   omalizumab (XOLAIR) prefilled syringe 300 mg Subcutaneous   tezepelumab-ekko (TEZSPIRE) 210 MG/1.91ML syringe 210 mg Subcutaneous   Facility-Administered Medications Ordered in Other Visits (Other)  Medication Route   gadopentetate dimeglumine (MAGNEVIST) injection 20 mL Intravenous      REVIEW OF SYSTEMS: ROS   Positive for:  Endocrine Negative for: Constitutional, Gastrointestinal, Neurological, Skin, Genitourinary, Musculoskeletal, HENT, Cardiovascular, Eyes, Respiratory, Psychiatric, Allergic/Imm, Heme/Lymph Last edited by Edmon Crapeankin, Lynnae Ludemann A, MD on 04/09/2022 10:33 AM.       ALLERGIES Allergies  Allergen Reactions   Flonase [Fluticasone] Shortness Of Breath   Sulfa Antibiotics     Shortness of breath, increased heart rate; "happened quite a while ago"   Molds & Smuts Other (See Comments)    Aggravate asthma   Sulfites Itching and Swelling   Tetracyclines & Related Other (See Comments)    increased intracranial pressure   Lactose Nausea Only   Lactose Intolerance (Gi) Diarrhea and Nausea And Vomiting    Upset stomach   White Birch Cough    PAST MEDICAL HISTORY Past Medical History:  Diagnosis Date   ADHD (attention deficit hyperactivity disorder)    Anxiety    Asthma    Autonomic neuropathy due to diabetes (HCC)    Chronic headaches    Diabetes (HCC)    Fatigue    GERD (gastroesophageal reflux disease)    Goiter    High blood pressure    High cholesterol    Hypercholesterolemia    Hypertension    Hypoglycemia associated with diabetes (HCC)    Malnutrition (HCC)    Obesity    Sleep apnea    mild, no cpap   Tachycardia    Type 1 diabetes mellitus not at goal [facility](HCC)    Uncontrolled DM with microalbuminuria or microproteinuria    Past Surgical History:  Procedure Laterality Date   EYE SURGERY Right 05/30/2020   Vitrectomy, Dr. Luciana Axeankin   EYE SURGERY  04/2020   IR 3D INDEPENDENT WKST  07/16/2020   IR ANGIO INTRA EXTRACRAN SEL COM CAROTID INNOMINATE BILAT MOD SED  07/16/2020   IR ANGIO VERTEBRAL SEL SUBCLAVIAN INNOMINATE UNI L MOD SED  07/16/2020   IR ANGIO VERTEBRAL SEL VERTEBRAL UNI R MOD SED  07/16/2020   IR US GUIDE VASC ACCESS RIGHT  07/16/2020   REFRACTIVE SURGERY     x9   WISDOM TOOTH EXTRACTION      FAMILY HISTORY Family History  Problem Relation Age of Onset   Dementia Paternal  Aunt    Schizophrenia Paternal Aunt    Cancer Maternal Grandmother        type unknown   Diabetes Maternal Grandmother        T2 DM   Cancer Maternal Grandfather        type unknown   Irritable bowel syndrome Mother    Heart disease Father    Hypertension Father    Hyperlipidemia Father    Skin cancer Father    Thyroid disease Sister    Stomach cancer Paternal Aunt    Pancreatic cancer Paternal Aunt    Diabetes Maternal Aunt    Allergic rhinitis Neg Hx    Angioedema Neg Hx    Asthma Neg Hx    Atopy Neg Hx    Eczema Neg Hx  Immunodeficiency Neg Hx    Urticaria Neg Hx    Esophageal cancer Neg Hx    Colon cancer Neg Hx    Colon polyps Neg Hx     SOCIAL HISTORY Social History   Tobacco Use   Smoking status: Never   Smokeless tobacco: Never  Vaping Use   Vaping Use: Never used  Substance Use Topics   Alcohol use: No   Drug use: No         OPHTHALMIC EXAM:  Base Eye Exam     Visual Acuity (ETDRS)       Right Left   Dist cc 20/15 20/15         Tonometry (Tonopen, 10:36 AM)       Right Left   Pressure 15 15         Pupils       Pupils   Right PERRL   Left PERRL         Visual Fields       Left Right    Full Full         Extraocular Movement       Right Left    Full, Ortho Full, Ortho         Neuro/Psych     Oriented x3: Yes   Mood/Affect: Normal         Dilation     Both eyes: 1.0% Mydriacyl, 2.5% Phenylephrine @ 10:36 AM           Slit Lamp and Fundus Exam     External Exam       Right Left   External Normal Normal         Slit Lamp Exam       Right Left   Lids/Lashes Normal Normal   Conjunctiva/Sclera White and quiet White and quiet   Cornea Clear Clear   Anterior Chamber Deep and quiet Deep and quiet   Iris Round and reactive Round and reactive   Lens Trace Nuclear sclerosis Trace Nuclear sclerosis   Anterior Vitreous Normal Normal         Fundus Exam       Right Left   Posterior  Vitreous avitric Normal, no posterior vitreous detachment   Disc Normal Normal, No edema, no heme   C/D Ratio 0.0 0.0   Macula Microaneurysms Microaneurysms, no clinically significant macular edema   Vessels PDR,  quiescent PDR-inactive , good laser photocoagulation and diathermized sites of prior neovascularization superior to the nerve   Periphery Clear media, good PRP PRP, no retinal holes or tears, attached            IMAGING AND PROCEDURES  Imaging and Procedures for 04/09/22  OCT, Retina - OU - Both Eyes       Right Eye Quality was good. Scan locations included subfoveal. Central Foveal Thickness: 334. Progression has been stable. Findings include normal foveal contour.   Left Eye Quality was good. Scan locations included subfoveal. Central Foveal Thickness: 320. Progression has been stable. Findings include normal foveal contour, vitreomacular adhesion .   Notes No active maculopathy OU, VMA OS physiologic      Color Fundus Photography Optos - OU - Both Eyes       Right Eye Progression has improved. Disc findings include normal observations. Macula : normal observations. Periphery : normal observations.   Left Eye Progression has improved. Macula : normal observations.   Notes PDR, quiescent OD, quite PDROS  ASSESSMENT/PLAN:  Stable treated proliferative diabetic retinopathy of left eye with macular edema determined by examination associated with type 1 diabetes mellitus (HCC) The nature of regressed proliferative diabetic retinopathy was discussed with the patient. The patient was advised to maintain good glucose, blood pressure, monitor kidney function and serum lipid control as advised by personal physician. Rare risk for reactivation of progression exist with untreated severe anemia, untreated renal failure, untreated heart failure, and smoking. Complete avoidance of smoking was recommended. The chance of recurrent proliferative diabetic  retinopathy was discussed as well as the chance of vitreous hemorrhage for which further treatments may be necessary.   Explained to the patient that the quiescent  proliferative diabetic retinopathy disease is unlikely to ever worsen.  Worsening factors would include however severe anemia, hypertension out-of-control or impending renal failure.  Stable treated proliferative diabetic retinopathy of right eye with macular edema determined by examination associated with type 1 diabetes mellitus (HCC) The nature of regressed proliferative diabetic retinopathy was discussed with the patient. The patient was advised to maintain good glucose, blood pressure, monitor kidney function and serum lipid control as advised by personal physician. Rare risk for reactivation of progression exist with untreated severe anemia, untreated renal failure, untreated heart failure, and smoking. Complete avoidance of smoking was recommended. The chance of recurrent proliferative diabetic retinopathy was discussed as well as the chance of vitreous hemorrhage for which further treatments may be necessary.   Explained to the patient that the quiescent  proliferative diabetic retinopathy disease is unlikely to ever worsen.  Worsening factors would include however severe anemia, hypertension out-of-control or impending renal failure.  Nuclear sclerotic cataract of both eyes Minimal OU, not visually significant     ICD-10-CM   1. Vitreomacular adhesion of left eye  H43.822 OCT, Retina - OU - Both Eyes    Color Fundus Photography Optos - OU - Both Eyes    2. Controlled type 1 diabetes mellitus with stable proliferative retinopathy of both eyes (HCC)  E10.3553 OCT, Retina - OU - Both Eyes    Color Fundus Photography Optos - OU - Both Eyes    3. Pseudotumor cerebri  G93.2 Color Fundus Photography Optos - OU - Both Eyes    4. Stable treated proliferative diabetic retinopathy of left eye with macular edema determined by examination  associated with type 1 diabetes mellitus (HCC)  D98.3382    N05.3976     5. Stable treated proliferative diabetic retinopathy of right eye with macular edema determined by examination associated with type 1 diabetes mellitus (HCC)  B34.1937    E10.3511     6. Nuclear sclerotic cataract of both eyes  H25.13       1.  Bilateral PDR now quiescent.  2.  Years past patient had superimposed pseudotumor cerebri with optic nerve edema exacerbating the vascular damage from the PDR.  All of this has now improved as patient has lost weight, his pseudotumor cerebri has improved.  Patient takes less Diamox daily.  Under the care of [facility] neuro[facility].  3.  No active vascular disease from diabetic retinopathy at this point  4.  Mild NSC changes no impact on acuity.  Follow-up here as needed or in 9 months Ophthalmic Meds Ordered this visit:  No orders of the defined types were placed in this encounter.      Return in about 9 months (around 01/08/2023) for DILATE OU, COLOR FP, OCT.  There are no Patient Instructions on file for this visit.   Explained  the diagnoses, plan, and follow up with the patient and they expressed understanding.  Patient expressed understanding of the importance of proper follow up care.   Alford Highland Neka Bise M.D. Diseases & Surgery of the Retina and Vitreous [facility] 04/09/22     Abbreviations: M myopia (nearsighted); A astigmatism; H hyperopia (farsighted); P presbyopia; Mrx spectacle prescription;  CTL contact lenses; OD right eye; OS left eye; OU both eyes  XT exotropia; ET esotropia; PEK punctate epithelial keratitis; PEE punctate epithelial erosions; DES dry eye syndrome; MGD meibomian gland dysfunction; ATs artificial tears; PFAT's preservative free artificial tears; NSC nuclear sclerotic cataract; PSC posterior subcapsular cataract; ERM epi-retinal membrane; PVD posterior vitreous detachment; RD retinal detachment; DM diabetes mellitus; DR  diabetic retinopathy; NPDR non-proliferative diabetic retinopathy; PDR proliferative diabetic retinopathy; CSME clinically significant macular edema; DME diabetic macular edema; dbh dot blot hemorrhages; CWS cotton wool spot; POAG primary open angle glaucoma; C/D cup-to-disc ratio; HVF humphrey visual field; GVF goldmann visual field; OCT optical coherence tomography; IOP intraocular pressure; BRVO Branch retinal vein occlusion; CRVO central retinal vein occlusion; CRAO central retinal artery occlusion; BRAO branch retinal artery occlusion; RT retinal tear; SB scleral buckle; PPV pars plana vitrectomy; VH Vitreous hemorrhage; PRP panretinal laser photocoagulation; IVK intravitreal kenalog; VMT vitreomacular traction; MH Macular hole;  NVD neovascularization of the disc; NVE neovascularization elsewhere; AREDS age related eye disease study; ARMD age related macular degeneration; POAG primary open angle glaucoma; EBMD epithelial/anterior basement membrane dystrophy; ACIOL anterior chamber intraocular lens; IOL intraocular lens; PCIOL posterior chamber intraocular lens; Phaco/IOL phacoemulsification with intraocular lens placement; PRK photorefractive keratectomy; LASIK laser assisted in situ keratomileusis; HTN hypertension; DM diabetes mellitus; COPD chronic obstructive pulmonary disease

## 2022-04-09 NOTE — Assessment & Plan Note (Signed)

## 2022-04-09 NOTE — Assessment & Plan Note (Signed)
Minimal OU, not visually significant

## 2022-04-13 ENCOUNTER — Other Ambulatory Visit (INDEPENDENT_AMBULATORY_CARE_PROVIDER_SITE_OTHER): Payer: Self-pay | Admitting: "Endocrinology

## 2022-04-14 DIAGNOSIS — E109 Type 1 diabetes mellitus without complications: Secondary | ICD-10-CM | POA: Diagnosis not present

## 2022-04-14 DIAGNOSIS — E785 Hyperlipidemia, unspecified: Secondary | ICD-10-CM | POA: Diagnosis not present

## 2022-04-14 DIAGNOSIS — I10 Essential (primary) hypertension: Secondary | ICD-10-CM | POA: Diagnosis not present

## 2022-04-15 DIAGNOSIS — E785 Hyperlipidemia, unspecified: Secondary | ICD-10-CM | POA: Diagnosis not present

## 2022-04-15 DIAGNOSIS — I10 Essential (primary) hypertension: Secondary | ICD-10-CM | POA: Diagnosis not present

## 2022-04-15 DIAGNOSIS — E109 Type 1 diabetes mellitus without complications: Secondary | ICD-10-CM | POA: Diagnosis not present

## 2022-04-16 DIAGNOSIS — F411 Generalized anxiety disorder: Secondary | ICD-10-CM | POA: Diagnosis not present

## 2022-04-22 DIAGNOSIS — Z9641 Presence of insulin pump (external) (internal): Secondary | ICD-10-CM | POA: Diagnosis not present

## 2022-04-22 DIAGNOSIS — E1069 Type 1 diabetes mellitus with other specified complication: Secondary | ICD-10-CM | POA: Diagnosis not present

## 2022-04-22 DIAGNOSIS — Z794 Long term (current) use of insulin: Secondary | ICD-10-CM | POA: Diagnosis not present

## 2022-04-22 DIAGNOSIS — E1065 Type 1 diabetes mellitus with hyperglycemia: Secondary | ICD-10-CM | POA: Diagnosis not present

## 2022-04-24 DIAGNOSIS — E109 Type 1 diabetes mellitus without complications: Secondary | ICD-10-CM | POA: Diagnosis not present

## 2022-04-24 DIAGNOSIS — E785 Hyperlipidemia, unspecified: Secondary | ICD-10-CM | POA: Diagnosis not present

## 2022-04-24 DIAGNOSIS — F411 Generalized anxiety disorder: Secondary | ICD-10-CM | POA: Diagnosis not present

## 2022-04-24 DIAGNOSIS — I10 Essential (primary) hypertension: Secondary | ICD-10-CM | POA: Diagnosis not present

## 2022-04-25 DIAGNOSIS — F419 Anxiety disorder, unspecified: Secondary | ICD-10-CM | POA: Diagnosis not present

## 2022-04-25 DIAGNOSIS — Z7682 Awaiting organ transplant status: Secondary | ICD-10-CM | POA: Diagnosis not present

## 2022-04-25 DIAGNOSIS — Z008 Encounter for other general examination: Secondary | ICD-10-CM | POA: Diagnosis not present

## 2022-04-30 DIAGNOSIS — F411 Generalized anxiety disorder: Secondary | ICD-10-CM | POA: Diagnosis not present

## 2022-05-01 DIAGNOSIS — G932 Benign intracranial hypertension: Secondary | ICD-10-CM | POA: Diagnosis not present

## 2022-05-05 ENCOUNTER — Ambulatory Visit: Payer: BC Managed Care – PPO | Admitting: Family Medicine

## 2022-05-07 DIAGNOSIS — F411 Generalized anxiety disorder: Secondary | ICD-10-CM | POA: Diagnosis not present

## 2022-05-09 ENCOUNTER — Ambulatory Visit (INDEPENDENT_AMBULATORY_CARE_PROVIDER_SITE_OTHER): Payer: BC Managed Care – PPO | Admitting: Family Medicine

## 2022-05-09 VITALS — BP 106/69 | Ht 71.0 in | Wt 198.0 lb

## 2022-05-09 DIAGNOSIS — L989 Disorder of the skin and subcutaneous tissue, unspecified: Secondary | ICD-10-CM

## 2022-05-09 DIAGNOSIS — E11649 Type 2 diabetes mellitus with hypoglycemia without coma: Secondary | ICD-10-CM

## 2022-05-09 NOTE — Patient Instructions (Addendum)
We will check on the dermatology referral.  [facility] [address], [place], [place] [address] Phone: [phone]   Hopefully the transplant team gets in touch with you soon and this is green-lighted. Follow up with me in 6-8 weeks for reevaluation.

## 2022-05-09 NOTE — Progress Notes (Signed)
PCP: Aura Dials, PA-C  Subjective:   HPI: Patient is a 41 y.o. male here for follow-up.  5/31: Patient with no real changes with respect to physical activities. He has visited with [facility] transplant team and undergoing labwork for possible pancreatic transplant - sense he received was that they are 50/50 on whether or not to recommend he go ahead with the transplant. [name] understands the risks and it would greatly improve his quality of life with how much trouble he's had with sites, hypoglycemic episodes. He is to receive a call from them following labwork to see what next steps are and if any additional labwork is needed. He has a scaly lesion on his right shin - believes this is a site where he struck his shin about 2 years ago but this area persists - does not have a dermatologist.  Wound itself healed after this though took a while.  7/14: Patient reports he tried going off pump and using pens but blood sugars stayed up presumably because he had site issues with these as well. He has met with psychologist as part of evaluation for islet cell or pancreas transplant and believes this went well. Group meets monthly to review cases so may hear something in the coming weeks. Has not heard yet about dermatology appointment for lesions on right shin.  Past Medical History:  Diagnosis Date   ADHD (attention deficit hyperactivity disorder)    Anxiety    Asthma    Autonomic neuropathy due to diabetes (HCC)    Chronic headaches    Diabetes (HCC)    Fatigue    GERD (gastroesophageal reflux disease)    Goiter    High blood pressure    High cholesterol    Hypercholesterolemia    Hypertension    Hypoglycemia associated with diabetes (HCC)    Malnutrition (HCC)    Obesity    Sleep apnea    mild, no cpap   Tachycardia    Type 1 diabetes mellitus not at goal [facility])    Uncontrolled DM with microalbuminuria or microproteinuria     Current Outpatient Medications on File Prior to Visit   Medication Sig Dispense Refill   acetaZOLAMIDE ER (DIAMOX) 500 MG capsule 625 mg.     albuterol (VENTOLIN HFA) 108 (90 Base) MCG/ACT inhaler Inhale 2 puffs into the lungs every 4 (four) hours as needed for wheezing or shortness of breath. 18 g 1   atorvastatin (LIPITOR) 20 MG tablet Take 20 mg by mouth daily.   0   BAQSIMI TWO PACK 3 MG/DOSE POWD USE 1 SPRAY INTO THE NOSE AS DIRECTED 2 each 3   Continuous Blood Gluc Receiver (DEXCOM G6 RECEIVER) DEVI 1 Device by Does not apply route as directed. 1 each 2   Continuous Blood Gluc Sensor (DEXCOM G6 SENSOR) MISC Change sensor every 10 days 3 each 11   Continuous Blood Gluc Sensor (FREESTYLE LIBRE 2 SENSOR) MISC USE EVERY 14 DAYS. USE IF DEXCOM FAILS 2 each 5   Continuous Blood Gluc Transmit (DEXCOM G6 TRANSMITTER) MISC USE AS DIRECTED WITH SENSOR UP TO 8 TIMES 1 each 0   EPINEPHrine (AUVI-Q) 0.3 mg/0.3 mL IJ SOAJ injection Use as directed for life-threatening allergic reaction. 2 each 2   Fluticasone-Salmeterol,sensor, (AIRDUO DIGIHALER) 113-14 MCG/ACT AEPB Inhale 1 puff into the lungs 2 (two) times daily. 1 each 5   HUMALOG 100 UNIT/ML injection Use 300 units in insulin pump every 48 hours 120 mL 1   insulin aspart (NOVOLOG) 100  UNIT/ML injection Use up to 300 units in pump every 48 hours 120 mL 3   Insulin Lispro Junior KwikPen (HUMALOG JR) 100 UNIT/ML KwikPen ADMINISTER UP TO 50 UNITS UNDER THE SKIN DAILY AS DIRECTED BY PRESCRIBER 15 mL 11   Insulin Pen Needle (PEN NEEDLES) 32G X 4 MM MISC Use to inject insulin 6x per day 200 each 5   levocetirizine (XYZAL) 5 MG tablet Take 1 tablet (5 mg total) by mouth daily as needed for allergies (Can take an extra dose during flare ups.). 60 tablet 5   lipase/protease/amylase (CREON) 36000 UNITS CPEP capsule 3 pills with every meal and 2 pills with every snack 390 capsule 11   losartan (COZAAR) 50 MG tablet TAKE 1 TABLET(50 MG) BY MOUTH DAILY 90 tablet 1   montelukast (SINGULAIR) 10 MG tablet Take 1 tablet  (10 mg total) by mouth at bedtime. 30 tablet 5   Multiple Vitamin (MULTIVITAMIN WITH MINERALS) TABS tablet Take 1 tablet by mouth daily.     NUCALA 100 MG/ML SOAJ Inject 100 mg into the skin every 28 (twenty-eight) days.     omeprazole (PRILOSEC) 20 MG capsule Take 1 capsule (20 mg total) by mouth daily. 30 capsule 11   ondansetron (ZOFRAN) 4 MG tablet Take one tablet every 8 hours on injection days. 20 tablet 3   ONETOUCH VERIO test strip TEST BLOOD SUGAR 8 TIMES DAILY 250 strip 3   topiramate (TOPAMAX) 50 MG tablet Take 1 tablet (50 mg total) by mouth 2 (two) times daily. Please call and make overdue appt for further refills. 2nd attempt 30 tablet 0   Vitamin D, Ergocalciferol, (DRISDOL) 1.25 MG (50000 UNIT) CAPS capsule Take 50,000 Units by mouth every 7 (seven) days.     Current Facility-Administered Medications on File Prior to Visit  Medication Dose Route Frequency Provider Last Rate Last Admin   gadopentetate dimeglumine (MAGNEVIST) injection 20 mL  20 mL Intravenous Once PRN Dohmeier, Asencion Partridge, MD       Mepolizumab SOLR 100 mg  100 mg Subcutaneous Q28 days Valentina Shaggy, MD   100 mg at 02/16/19 1018   omalizumab Arvid Right) injection 300 mg  300 mg Subcutaneous Q28 days Jiles Prows, MD   300 mg at 10/24/21 1018   omalizumab Arvid Right) prefilled syringe 300 mg  300 mg Subcutaneous Q28 days Jiles Prows, MD   300 mg at 11/21/21 0956   tezepelumab-ekko (TEZSPIRE) 210 MG/1.91ML syringe 210 mg  210 mg Subcutaneous Q28 days Jiles Prows, MD   210 mg at 12/31/21 3295    Past Surgical History:  Procedure Laterality Date   EYE SURGERY Right 05/30/2020   Vitrectomy, Dr. Zadie Rhine   EYE SURGERY  04/2020   IR 3D INDEPENDENT WKST  07/16/2020   IR ANGIO INTRA EXTRACRAN SEL COM CAROTID INNOMINATE BILAT MOD SED  07/16/2020   IR ANGIO VERTEBRAL SEL SUBCLAVIAN INNOMINATE UNI L MOD SED  07/16/2020   IR ANGIO VERTEBRAL SEL VERTEBRAL UNI R MOD SED  07/16/2020   IR US GUIDE VASC ACCESS RIGHT  07/16/2020    REFRACTIVE SURGERY     x9   WISDOM TOOTH EXTRACTION      Allergies  Allergen Reactions   Flonase [Fluticasone] Shortness Of Breath   Sulfa Antibiotics     Shortness of breath, increased heart rate; "happened quite a while ago"   Molds & Smuts Other (See Comments)    Aggravate asthma   Sulfites Itching and Swelling   Tetracyclines & Related Other (  See Comments)    increased intracranial pressure   Lactose Nausea Only   Lactose Intolerance (Gi) Diarrhea and Nausea And Vomiting    Upset stomach   White [place]ch Cough    BP 106/69   Ht _0  (1.803 m)   Wt 198 lb (89.8 kg)   BMI 27.62 kg/m      09/11/2020    9:45 AM 06/24/2021   10:44 AM 08/05/2021   10:00 AM 09/16/2021   10:08 AM  [facility] Adult Exercise  Frequency of aerobic exercise (# of days/week) _1 Average time in minutes _2 Frequency of strengthening activities (# of days/week) 0 _3 No data to display              Objective:  Physical Exam:  Gen: NAD, comfortable in exam room  Right shin: Scaly lesion anterior right shin without telangiectasia or ulceration.  Very small healing break in skin anteromedial shin.  Assessment & Plan:  1. Type 1 Diabetes - trial off pump switching to injection therapy did not go well unfortunately also due to site issues.  Continues to be evaluated for pancreas or islet cell transplant - letter of support faxed for him.  2. Right lower extremity lesion(s) - referred to dermatology.

## 2022-05-12 ENCOUNTER — Encounter: Payer: Self-pay | Admitting: Family Medicine

## 2022-05-12 NOTE — Telephone Encounter (Signed)
Ok to refer [name] to Dr. Emily Filbert.  Thanks!

## 2022-05-14 DIAGNOSIS — F411 Generalized anxiety disorder: Secondary | ICD-10-CM | POA: Diagnosis not present

## 2022-05-16 ENCOUNTER — Telehealth: Payer: Self-pay | Admitting: Allergy and Immunology

## 2022-05-16 MED ORDER — AIRDUO DIGIHALER 113-14 MCG/ACT IN AEPB: 1.0000 | INHALATION_SPRAY | Freq: Two times a day (BID) | RESPIRATORY_TRACT | 5 refills | Status: DC

## 2022-05-16 NOTE — Telephone Encounter (Signed)
[name] needs refills on AirDuo DigiHaler and would like them sent to [facility] [place], [place] - [address]  [address] [name] [address]

## 2022-05-16 NOTE — Telephone Encounter (Signed)
Called and informed patient that his Airduo has been sent. Patient verbalized understanding.

## 2022-05-18 NOTE — Progress Notes (Signed)
Subjective:  Patient Name: [name] Date of Birth: [birth year]  MRN: [id number]  [name]  presents at his clinic visit today for follow-up of his type 1 diabetes mellitus, goiter, obesity, combined hyperlipidemia, adult ADHD, fatigue, autonomic neuropathy, tachycardia, hypertension, microalbuminuria, hypoglycemia, sinusitis, bronchitis, allergies, proliferative retinopathy, pseudotumor cerebri/increased intracranial hypertension, and unintentional weight loss.[name]   HISTORY OF PRESENT ILLNESS:   [name] is a 41 y.o. Caucasian gentleman. [name] was unaccompanied.  1. The patient was first referred to me on 01/08/2006 by his family physician, Dr. Roanna Epley, for evaluation and management of type 1 diabetes mellitus and related problems. The patient was 41 years old.  A. [name] had been diagnosed with type 1 diabetes somewhere in 2001-2002, at the age of 22-19. He was initially diagnosed with type 2 diabetes mellitus, but was later re-classified as type 1 diabetes mellitus. He had been followed by Dr. Arther Dames, staff endocrinologist at [facility]. [name] was started on an insulin pump approximately 18 months prior to first seeing me. His pump was a Medtronic Paradigm 712. His blood glucose control was fair-to-poor. He was frequently not checking blood sugars or taking insulin boluses as often as he needed to. He frequently noted fast heart rate. The patient's past medical history was positive for hypertension, for which he was taking lisinopril, and for what his mother called "extreme ADHD". He had previously been taking ADHD medicines but had discontinued them due to adverse effects. He had prior ankle injuries and knee injuries, to include tears of the lateral menisci bilaterally. He had not had any surgeries. He was working for a [employer] that had many different job sites in different parts of the [place] and in other countries as well. As a result, the patient was on the road a lot and spent  much of his time outdoors at field sites. He did not use tobacco or drugs, but did drink alcohol occasionally.  B. On physical examination, his weight was 234 pounds, his height was 70 inches, his BMI was 33.2. Blood pressure was 128/70. Hemoglobin A1c was 9.5%. Heart rate was 96. He had normal affect and fair insight. He had a 25-30 gram goiter. He had normal 1+ DP pulses in his feet. He had normal sensation in his feet to touch, vibration, and monofilament. His CMP was normal except for glucose of 251. His cholesterol was 257, triglycerides 143, HDL 49, and LDL 179. TSH was 1.017, free T4 was 1.06, and free T3 was 3.3. His urinary microalbumin: creatinine ratio was 20.1 (normal less than 30).   C. The patient clearly needed better blood glucose control, which would only occur if he had better adherence to his plan. He appeared to have autonomic neuropathy, manifested by inappropriate sinus tachycardia.  His thyroid goiter suggested that he might have evolving Hashimoto's disease. He stated that he had lost some weight recently. I encouraged him to check his blood sugars more frequently and to take both correction boluses and food boluses.  2. During the past sixteen years, [name] has had to deal with many different problems:  A. T1DM/hypoglycemia:    1). 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 His hemoglobin A1c values had varied from 8.3-11.2%.   2). After starting him on a Medtronic 670G insulin pump and Guardian 3 sensor on 01/12/17, his BG control has improved significantly, despite the illnesses  described below.   B. DM complications: .   1). He has bilateral diabetic retinopathy and is followed by his retinologist, Dr. Deloria Lair, MD.     A). He saw Dr. Zadie Rhine in October 2020 and again in February 2021. There were no new signs of diabetic  eye disease.     2). At [facility] visit with me in July 2021 he asked me to arrange a second opinion retinal consultation for him. I arranged for him to see Dr. Dennard Nip in the [place].    2). He has autonomic neuropathy manifested by inappropriate sinus tachycardia and gastroparesis causing postprandial bloating and GERD. These problems have varied inversely with his degree of BG control.   C. Combined hyperlipidemia:     1). On 12/03/10 his total cholesterol was 270, triglycerides 94, HDL 50, and LDL 201. I started him on Crestor, 10 mg per day. He has since begun treatment with atorvastatin and his cholesterol values had improved.    2). On 12/29/16 his lipids had improved, but his cholesterol values were still elevated. After developing the illnesses noted below, his atorvastatin was discontinued due to concerns that he might be having adverse reactions to the atorvastatin.    D. Allergies, sinusitis, bronchitis, headaches, muscle pains/spasms:   1). Unfortunately, beginning in about June 2018, [name] began to develop recurrent episodes of allergic rhinitis, head fullness, sinusitis, and bronchitis. He also developed headaches, muscle pains and spasms, lethargy, fatigue, and weakness that were presumably due in large part to severe allergies. His atorvastatin and several other medications were stopped at that time. Despite receiving excellent care from his allergist, to include allergy injections and biologic injections, [name]'s allergic issues persisted.    2). He saw Dr. Carmelina Peal on 03/15/19 and again in February 2021. [name]'s allergies and asthma have been okay since has been wearing his covid mask. He has not needed steroids recently. His second sleep study was not bad enough to start on C-pap. In the past 8-10 months these illnesses/problems have largely resolved, in part due to taking a monthly biologic allergy injection.   E. Increased intracranial pressure:    1). [name] was seen in consultation by Dr.  Larey Seat, neurology, on 05/28/17 for headaches, tremors of his right hand and foot, shaky vision, and symptoms c/w OSA. His initial sleep study did not show any significant amount of OSA.    2). On 03/08/18, Dr.Dohmeier was notified that [place] eye doctor had seen signs of fluid build up in his right eye. An LP was performed in radiology on 03/09/18. Opening pressure was 28 cm of water, c/w increased intracranial pressure and pseudotumor cerebri. This problem was initially though to be due to tetracycline usage, so TCN was discontinued and Diamox begun. The Diamox doses have been changed over time.    3). On 01/03/19 he had an LP that showed elevated pressure. His diamox dose was increased somewhat. Dr. Brett Fairy also started him on Topamax. He had a follow up visit with Dr. Brett Fairy on 02/23/20. She continued him on low doses of both medications. He still has some adverse effects of the Diamox, but not as bad. He still has some mild fatigue.  G. Adult ADD/depression: [name] was evaluated by Dr Carlena Hurl, a noted neuropsychiatrist in [place] in 2018. At his last follow up visit in November 2018, Dr. Wylene Simmer decided to wait until [name]'s overall health situation  stabilized before trying any new medications.   H. General physical and emotional health: [name]  had been very sick for much of the past 3 years. He has been unable to work full-time and has become almost confined to his home. He has become increasingly anhedonic over time.   I. Neck pains, decreased cervical range of motion, and shoulder girdle pains:   1). Several months ago he began to develop pains in his left upper chest and shoulder area. He saw Dr. Oneida Alar. The pains then went into his right upper chest and shoulder, then into his shoulder blades. Now he can't bend his neck. [place] of his left shoulder was negative.    2). He missed his follow up appointment with Dr. Oneida Alar due to other conflicting medical appointments. I asked him to  re-schedule that appointment.  J. He had an MRI and an MRA of his brain. The MRI of the brain and brainstem down to C3 was normal. The MRA showed the following:  Moderate stenosis of the M1 segment of the left middle cerebral artery.  Negligible flow through the A1 segment of the left anterior cerebral artery.  This could be due to a hypoplastic/aplastic segment, a normal variant.  This could also be due to severe stenosis.  The left A2 segment derived this flow from the right to through the anterior communicating artery a "moderate stenosis of the M1 segment of the left middle cerebral artery: He then had an angiography procedure to evaluate his bilateral carotid  And innominate arterial circulation. That study showed: Approximately 65-70% stenosis of the left internal carotid artery supraclinoid segment secondary to a mildly irregular intracranial arteriosclerotic plaque. No opacification of the left anterior cerebral A1 segment most likely represents a pathologic intracranial occlusion. 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 Approximately 10 mm x 8.3 mm pyramidal configured high-riding jugular bulb. [name] was told to" take 325 mg of ASA daily as a secondary stroke prevention strategy".  K. Following the angiography procedure he developed bilateral palmar hand numbness. Fortunately, these symptoms are improving   L. He has an asthma consultation with Ms Juliann Pares, [place], on 07/24/20. He had mild restrictive disease, essentially unchanged.  M. He had an evaluation by Dr. Celine Ahr, [facility], on 06/19/20 and a follow up visit on 10.01/21. The cardiologist recommended continuing his  ASA and statin therapy, but did not feel any cardiology follow up was needed.   Doristine Counter is followed by [facility] for Idiopathic Intracranial Hypertension (IIH). He  saw his neurologist at [facility] on 10/15/20. MRA of the brain did not add any additional info. [name] had a follow up visit on 01/03/21.  O. On 01/30/21, I saw him as an urgent add-on because Dr. Karlton Lemon saw [name] in the [facility] that day and thought that [name] looked bad and needed to be admitted. When I saw [name] I agreed. I contacted the Internal Medicine Teaching Service and had Dwight admitted for evaluation and management.    1). During that admission from 01/30/21-02/02/21, [name] was thoroughly evaluated. TSH was 1.23, free T4 1.1, free T3 3.6; 25-OH vitamin D was 51.    2). To rule out autoimmune adrenal insufficiency (Addison's disease), two sets of ACTH and cortisol values were performed. On 01/30/21 at 1:56 PM, his ACTH was 23 (ref 6-50 in the AM) and his cortisol was 16.5 (ref 4-22 at 8 AM, 3-17 at 4 PM). On 01/31/21 at  7:24 AM his ACTH was 29.9 (ref 7.2-63.3 in the AM) and his cortisol was 6.5 (ref 6.7-22.6 in the AM). Although the ACTH values were well within normal limits, which would tend to exclude adrenal cortical insufficiency, he had one normal cortisol and one low cortisol. To further evaluate his hypothalamic-pituitary-adrenal (HPA) axis, an ACTH stimulation test was performed on 02/01/21 at 8:21 AM. Baseline cortisol was normal at 8.9. +30 minute cortisol was normal at 21.6. +60 minute cortisol was normal at 24.6. This very brisk and normal response ruled out adrenal insufficiency.    3). To rule out celiac disease, a tissue transglutaminase IgA and IgA were drawn. The tTG IgA was normal at <1.0. The IgA was normal at 136 (ref 47-310). Dr. Ardis Hughs also performed an endoscopy on 01/28/21 and took biopsies of the small stomach and small intestine. The gastric biopsies were negative for H. Pylori and the small intestinal biopsies were negative for celiac disease.                   4). To rule out an extrapancreatic insulinoma, a C-peptide was obtained on 01/30/21. The value was very low at <0.1  (ref 1.1-4.4), c/w his autoimmune T1DM.              5). To rule out a pancreatic tumor, an abdominal MRI was performed with and without contrast on 01/31/21. The pancreas was "moderately thin/atrophic", similar to a CT scan performed on 03/10/18. "There was no abnormal pancreatic parenchymal enhancement.   6). To rule out exocrine pancreas insufficiency, three tests were performed on 01/31/21.  The serum lipase was normal at 22 (ref 11-51). The serum amylase was low at 23 (ref 28-100). The stool pancreatic elastase was markedly low at 76 (ref >200). The low serum amylase and the very low stool elastase confirmed the clinical suspicion of pancreatic exocrine deficiency. Unfortunately, we do not know the cause of this exocrine insufficiency.    7). [name] was started on Creon and discharged on 02/02/21. His initial Creon dose was one 36,000 unit capsule by mouth with meals.   P. On 02/12/21 he had a CT/MRA scan of his head and neck. The brain was normal. He had mild soft plaque at the ICA bulb. No stenosis was seen. He had a 50% stenosis of the left supraclinoid ICA. There was no other intracranial large of medium vessel occlusion of correctable proximal stenosis. No patent A1 segment was seen on the left. Both anterior cerebral arteries receive their blood supply from the right carotid circulation.   Q. He met with Dr. Ardis Hughs in GI on 02/26/2021. Dr. Ardis Hughs increased the Creon doses to 3 with meals and 2 with snacks. Dr. Ardis Hughs referred [name] to [facility] Nutrition and Diabetes for assistance in dietary management of his pancreatic insufficiency, .   3. [name]'s last [facility] visit was on 05/21/2021. At that visit we continued his insulin pump settings.   A. In the interim, [name] has been receiving his DM care at [facility] from Ms. Randall Hiss. RD. He has also been trying to get into endocrinology at [facility] and [facility].   B. [name] is also on the list to have a combined kidney-pancreas transplant at [facility].  C. He is still being followed  by [facility] cardiology.  D. His pancreatic insufficiency has become much worse and he has lost "a ton" of weight, both subcutaneous tissue and muscle mass. It is harder for him to get his pump sites and CGMs to work. He  is purposely consuming fewer carbs in order to keep his sugars under better control. He is followed at [facility] for his pancreatic exocrine deficiency. He is now on much higher doses of Creon, 6 for meals and 3 for snacks.   E. He is still using the Tandem T-Slim with control IQ.  F. He is much weaker and has much less stamina. He walks on his treadmill about an hour a day, sometimes up to 5 hours in order to control his BGs.  G. He has both muscle cramps and joint pains.  H. He continues to see his neurologist at [facility]. His last visit occurred on 04/27/21. His HAs are much better.  I. He also saw the neuro-ophthalmologist at [facility] and Dr. Zadie Rhine here in [place].  Lisabeth Register was previously found to have vitamin D level of 10. He is now taking 50,000 IU/week. He also takes a MVI daily.   K. He continues to frequently see his therapist, Ms. [employer].   L He takes losartan, 50 mg/day. He rarely has any coughing unless his allergies act up. He also takes atorvastatin, 20 mg/day.   M. His insurance plan will not allow him to have any more than 200 BG test strips per month.    4. Pertinent Review of Systems: Constitutional. The patient feels weak, with many adverse GI and other physical symptoms.  Energy: Energy level is very low.  Sleep: The patient usually does not sleeps well.  Body temperature: The patient's body temperature varies with the environment.  Weight: Weight has decreased markedly.  Eyes: The patient's vision is good. His last eye exam was about a month ago. There were no signs of new DM damage.  Neck: The patient is not aware of any problems relating to the anterior neck and thyroid bed. There have been no significant problems swelling, pain, soreness, tenderness, pressure,  discomfort, or difficulty swallowing. Heart: The patient feels the expected increase in heart rate during exercise or other physical activities. There have been no significant problems with palpitations, irregular heart beats, chest pain, or chest pressure. Lungs: His asthma is better with Nucala. Gastrointestinal: As above  Musculoskeletal: Muscles and extremities are very weak. Hands: No tremor     Legs: No edema Psychological: Mood and psychological responses seem to be somewhat depressed, paralleling his health issues. . There have been no significant problems with sadness, depression, irritability, anger, or inappropriate responses to the actions of others. Mental: The patient has not had any significant problems with abilities to think, to pay attention, to remember, and to make decisions.        5. Pump printout: He changes sites every 1-3 days, often every day. Average BG is 148, compared with 200 at his last visit.   6. Dexcom CGM printout: We have data for the past 2 weeks. Average SG is 153, compared with 200 at his last visit and with 172 at his prior visit. SG range is 98-238, compared with 114-270 at his last visit.  Time in Range is 88%, compared with 67% at the last visit and with 68% at his prior visit. Time above range is 8%, compared with 23% at his last visit. Time below range is 4%. He has to spend a great deal of time every day attending to his DM care.    PAST MEDICAL, FAMILY, AND SOCIAL HISTORY:  Past Medical History:  Diagnosis Date   ADHD (attention deficit hyperactivity disorder)    Anxiety    Asthma  Autonomic neuropathy due to diabetes (HCC)    Chronic headaches    Diabetes (HCC)    Fatigue    GERD (gastroesophageal reflux disease)    Goiter    High blood pressure    High cholesterol    Hypercholesterolemia    Hypertension    Hypoglycemia associated with diabetes ([place])    Malnutrition (HCC)    Obesity    Sleep apnea    mild, no cpap   Tachycardia     Type 1 diabetes mellitus not at goal [facility])    Uncontrolled DM with microalbuminuria or microproteinuria     Family History  Problem Relation Age of Onset   Dementia Paternal Aunt    Schizophrenia Paternal Aunt    Cancer Maternal Grandmother        type unknown   Diabetes Maternal Grandmother        T2 DM   Cancer Maternal Grandfather        type unknown   Irritable bowel syndrome Mother    Heart disease Father    Hypertension Father    Hyperlipidemia Father    Skin cancer Father    Thyroid disease Sister    Stomach cancer Paternal Aunt    Pancreatic cancer Paternal Aunt    Diabetes Maternal Aunt    Allergic rhinitis Neg Hx    Angioedema Neg Hx    Asthma Neg Hx    Atopy Neg Hx    Eczema Neg Hx    Immunodeficiency Neg Hx    Urticaria Neg Hx    Esophageal cancer Neg Hx    Colon cancer Neg Hx    Colon polyps Neg Hx      Current Outpatient Medications:    atorvastatin (LIPITOR) 20 MG tablet, Take 20 mg by mouth daily. , Disp: , Rfl: 0   BAQSIMI TWO PACK 3 MG/DOSE POWD, USE 1 SPRAY INTO THE NOSE AS DIRECTED, Disp: 2 each, Rfl: 3   Continuous Blood Gluc Receiver (DEXCOM G6 RECEIVER) DEVI, 1 Device by Does not apply route as directed., Disp: 1 each, Rfl: 2   Continuous Blood Gluc Sensor (DEXCOM G6 SENSOR) MISC, Change sensor every 10 days, Disp: 3 each, Rfl: 11   Continuous Blood Gluc Transmit (DEXCOM G6 TRANSMITTER) MISC, USE AS DIRECTED WITH SENSOR UP TO 8 TIMES, Disp: 1 each, Rfl: 0   insulin aspart (NOVOLOG) 100 UNIT/ML injection, Use up to 300 units in pump every 48 hours, Disp: 120 mL, Rfl: 3   lipase/protease/amylase (CREON) 36000 UNITS CPEP capsule, 3 pills with every meal and 2 pills with every snack, Disp: 390 capsule, Rfl: 11   losartan (COZAAR) 50 MG tablet, TAKE 1 TABLET(50 MG) BY MOUTH DAILY, Disp: 90 tablet, Rfl: 1   topiramate (TOPAMAX) 50 MG tablet, Take 1 tablet (50 mg total) by mouth 2 (two) times daily. Please call and make overdue appt for further refills.  2nd attempt, Disp: 30 tablet, Rfl: 0   Vitamin D, Ergocalciferol, (DRISDOL) 1.25 MG (50000 UNIT) CAPS capsule, Take 50,000 Units by mouth every 7 (seven) days., Disp: , Rfl:    acetaZOLAMIDE ER (DIAMOX) 500 MG capsule, 625 mg. (Patient not taking: Reported on 05/19/2022), Disp: , Rfl:    albuterol (VENTOLIN HFA) 108 (90 Base) MCG/ACT inhaler, Inhale 2 puffs into the lungs every 4 (four) hours as needed for wheezing or shortness of breath. (Patient not taking: Reported on 05/19/2022), Disp: 18 g, Rfl: 1   Continuous Blood Gluc Sensor (FREESTYLE LIBRE 2 SENSOR) MISC, USE  EVERY 14 DAYS. USE IF DEXCOM FAILS (Patient not taking: Reported on 05/19/2022), Disp: 2 each, Rfl: 5   EPINEPHrine (AUVI-Q) 0.3 mg/0.3 mL IJ SOAJ injection, Use as directed for life-threatening allergic reaction. (Patient not taking: Reported on 05/19/2022), Disp: 2 each, Rfl: 2   Fluticasone-Salmeterol,sensor, (AIRDUO DIGIHALER) 113-14 MCG/ACT AEPB, Inhale 1 puff into the lungs 2 (two) times daily. (Patient not taking: Reported on 05/19/2022), Disp: 1 each, Rfl: 5   HUMALOG 100 UNIT/ML injection, Use 300 units in insulin pump every 48 hours (Patient not taking: Reported on 05/19/2022), Disp: 120 mL, Rfl: 1   Insulin Lispro Junior KwikPen (HUMALOG JR) 100 UNIT/ML KwikPen, ADMINISTER UP TO 50 UNITS UNDER THE SKIN DAILY AS DIRECTED BY PRESCRIBER (Patient not taking: Reported on 05/19/2022), Disp: 15 mL, Rfl: 11   Insulin Pen Needle (PEN NEEDLES) 32G X 4 MM MISC, Use to inject insulin 6x per day (Patient not taking: Reported on 05/19/2022), Disp: 200 each, Rfl: 5   levocetirizine (XYZAL) 5 MG tablet, Take 1 tablet (5 mg total) by mouth daily as needed for allergies (Can take an extra dose during flare ups.). (Patient not taking: Reported on 05/19/2022), Disp: 60 tablet, Rfl: 5   montelukast (SINGULAIR) 10 MG tablet, Take 1 tablet (10 mg total) by mouth at bedtime. (Patient not taking: Reported on 05/19/2022), Disp: 30 tablet, Rfl: 5   Multiple Vitamin  (MULTIVITAMIN WITH MINERALS) TABS tablet, Take 1 tablet by mouth daily. (Patient not taking: Reported on 05/19/2022), Disp: , Rfl:    NUCALA 100 MG/ML SOAJ, Inject 100 mg into the skin every 28 (twenty-eight) days. (Patient not taking: Reported on 05/19/2022), Disp: , Rfl:    omeprazole (PRILOSEC) 20 MG capsule, Take 1 capsule (20 mg total) by mouth daily. (Patient not taking: Reported on 05/19/2022), Disp: 30 capsule, Rfl: 11   ondansetron (ZOFRAN) 4 MG tablet, Take one tablet every 8 hours on injection days. (Patient not taking: Reported on 05/19/2022), Disp: 20 tablet, Rfl: 3   ONETOUCH VERIO test strip, TEST BLOOD SUGAR 8 TIMES DAILY (Patient not taking: Reported on 05/19/2022), Disp: 250 strip, Rfl: 3  Current Facility-Administered Medications:    Mepolizumab SOLR 100 mg, 100 mg, Subcutaneous, Q28 days, Valentina Shaggy, MD, 100 mg at 02/16/19 1018   omalizumab Arvid Right) injection 300 mg, 300 mg, Subcutaneous, Q28 days, Kozlow, Donnamarie Poag, MD, 300 mg at 10/24/21 1018   omalizumab Arvid Right) prefilled syringe 300 mg, 300 mg, Subcutaneous, Q28 days, Kozlow, Donnamarie Poag, MD, 300 mg at 11/21/21 0956   tezepelumab-ekko (TEZSPIRE) 210 MG/1.91ML syringe 210 mg, 210 mg, Subcutaneous, Q28 days, Kozlow, Donnamarie Poag, MD, 210 mg at 12/31/21 0093  Facility-Administered Medications Ordered in Other Visits:    gadopentetate dimeglumine (MAGNEVIST) injection 20 mL, 20 mL, Intravenous, Once PRN, Dohmeier, Asencion Partridge, MD  Allergies as of 05/19/2022 - Review Complete 05/19/2022  Allergen Reaction Noted   Flonase [fluticasone] Shortness Of Breath 06/10/2017   Sulfa antibiotics  12/10/2020   Molds & smuts Other (See Comments) 03/09/2018   Sulfites Itching and Swelling 02/04/2018   Tetracyclines & related Other (See Comments) 03/09/2018   Lactose Nausea Only 01/27/2022   Lactose intolerance (gi) Diarrhea and Nausea And Vomiting 01/05/2021   White birch Cough 04/10/2020    1. Work and Family: He can no longer work part-time at  home. 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  2. Activities: He walks every day for short distances, but is in pain.  3. Smoking, alcohol, or  drugs: He has not been drinking any alcohol or beer. No tobacco or drugs. 4. Primary Care Provider: His PCP is Ms Mattie Marlin, PA, [facility].  5. Neurology: Dr. Brett Fairy was his neurologist, but now he goes to [facility].  6. Retinology: Dr. Zadie Rhine 7. Urology: Dr. Jeffie Pollock at [facility] 8. Sports medicine: Dr. Oneida Alar and Dr. Barbaraann Barthel 9. Therapy: Ms. Rea College, RN, MSN 60. DM: Ms. Randall Hiss, RD  REVIEW OF SYSTEMS: There are no other significant problems involving Orvile's other body systems.   Objective:  Vital Signs:  BP 112/70   Pulse 100   Wt 198 lb 3.2 oz (89.9 kg)   BMI 27.64 kg/m     Ht Readings from Last 3 Encounters:  05/09/22 $RemoveB'5\' 11"'VzWiwVKY$  (1.803 m)  03/26/22 6' (1.829 m)  03/18/22 6' (1.829 m)   Wt Readings from Last 3 Encounters:  05/19/22 198 lb 3.2 oz (89.9 kg)  05/09/22 198 lb (89.8 kg)  03/26/22 199 lb (90.3 kg)    PHYSICAL EXAM:  Constitutional: The patient appears tired, weak, and antalgic. His weight is 17 pounds lower than at his  last visit. He looks and presents as being more tired and being in more pain. His affect is flat.  His insight is fair.  Eyes: There is no arcus or proptosis.  Mouth: The oropharynx appears normal. The tongue appears normal. There is normal oral moisture. There is no obvious gingivitis. Neck: There are no bruits present. The thyroid gland appears normal in size. The thyroid gland is approximately 20 grams in size. The consistency of the thyroid gland is normal. There is no thyroid tenderness to palpation. Lungs: The lungs are clear. Air movement is good. Heart: The heart rhythm and rate appear normal. Heart sounds S1 and S2 are normal. I do not appreciate any pathologic heart murmurs. Abdomen: The abdominal size is normal. Bowel  sounds are normal. The abdomen is soft and non-tender. There is no obviously palpable hepatomegaly, splenomegaly, or other masses.  Arms: Muscle mass appears appropriate for age. Radial pulses appear normal. Hands: There is no obvious tremor. Phalangeal and metacarpophalangeal joints appear normal. Palms are normal. Legs: Muscle mass appears appropriate for age. There is no edema.  Feet: There are no significant deformities. Dorsalis pedis pulses are normal bilaterally.  Neurologic: Muscle strength is normal for age and gender  in both the upper and the lower extremities. Muscle tone appears normal. Sensation to touch is normal in the legs and feet.     LAB DATA:   Labs 05/19/22: HbA1c 6.4%, CBG 190  Labs 03/20/22: HbA1c 6.2%, C-peptide <0.1   Labs 12/11/21: HbA1c 6.5%  Labs 08/21/22: HbA1c 6.2%  Labs 05/21/21: CBG 206  Labs 04/09/21: CBG 209  Labs 03/12/21: CBG 169  Labs 02/27/21: CBG 165  Labs 01/30/21: HbA1c 6.3%, CBG 163; Urine glucose negative, ketones negative  Labs 12/25/20: CBG 199   Labs 2/18-2/19/22: CBG 285; CBC normal; U/A >500 glucose, negative ketones; BMP normal, except CO2 20 and glucose 294; CK 77 (ref 49-397); hepatic panel normal  Labs 12/05/20: HbA1c 7.5%, CBG 165; BHOB  0.2 (ref 0.04-0.27); venous pH 7.284;   Labs 08/16/20: HbA1c 6.2%, CBG 215  Labs 05/14/20: HbA1c 6.5%, CBG 176; TSH 2.44, free T4 1.1, free T3 3.5; urinary microalbumin/creatinine ratio 2 (ref <30)  Labs 04/17/20: ESR 6 (ref 0-15), CK 82 (ref 49-439)  Labs 07/12/19: HbA1c 6.4%, CBG 231  Labs 04/26/19: HbA1c 6.8%, CBG 230  Labs 12/30/18: TSH 2.07,  free T4 1.1, free T3 3.2, TPO antibody <1, thyroglobulin antibody <1; CMP normal, except glucose 191; cholesterol 165, triglycerides 138, HDL 33, LDL 106; urinary microalbumin/creatinine ratio 5; vitamin B1 10 (ref 8-30), vitamin B6 3.4 (ref 2.1-21.7), vitamin B12 451 (ref [phone])  Labs 11/29/18: CBG 179  Labs 11/29/18: CMP with glucose 198, chloride 109  (ref 96-106, CO2 17 (ref 20-29); CBC with WBC 11.6 (ref 3.4-10.8), PMNs 8.6 (ref 1.4-7.0), and monocytes 1.0 (ref 0.1-0.9)  Labs 10/28/18: HbA1c 6.9%, CBG 155  Labs 10/06/18: ANA titer negative, ANCA titers negative, rheumatoid factor negative, cyclic citrullin peptide 7 (ref 0-190  Labs 07/27/18: CBG 193  Labs 05/31/18: HbA1c 6.6%, CBG 202  Labs 03/18/18: CBG 178  Labs 01/13/18: HbA1c 6.8%, CBG 194; TSH 0.98, free T4 1.3; free T3 3.6; CMP normal except glucose 191; urinary microalbumin/creatinine ratio 35 (ref <30)  Labs 11/17/17: CBG 170  Labs 10/13/17: HbA1c 6.8%, CBG 191  Labs 08/24/17: CBG 117  Labs 07/20/17: CBG 216  Labs 07/08/17: IgA, IgG, and IgM normal. CBC normal.  Labs 06/16/17: HbA1c 6.5%, CBG 193  Labs 05/15/17: CBG 209; TSH 0.90, free T4 1.2, free T3 3.8  Labs 03/05/17: HbA1c 7.3%, CBG 241  Labs 02/20/17: CBG 180 fasting  Labs 01/29/17: CBG 148  Labs 01/12/17: CBG 116  Labs 12/29/16: HbA1c 9.1%, CBG 223; TSH 0.62, free T4 1.1, free T3 3.5; urine microalbumin/creatinine ratio 22; cholesterol 171, triglycerides 93, HDL 40, LDL 112;  CMP glucose 201  Labs 09/26/16: HbA1c 9.5%  Labs 08/13/16: Celiac panel negative; CMP normal except for glucose 207; CBC normal  Labs 06/13/16: HbA1c 9.8%; LH 3.6, FSH 2.3, testosterone 533 (ref 250-827), free testosterone 59.4 (ref 47-244)  Labs 02/11/16: HbA1c 9.2%  Labs 01/07/16: HbA1c 10.5%; CBC with Hgb elevated at 17.2%; LH 3.9, FSH 2.0; TSH 1.53, free T4 1.3, free T3 3.7; cholesterol 262, triglycerides 171, HDL 40, LDL 188; urinary microalbumin/creatinine ratio 31; CMP normal except for glucose 181  Labs 06/29/15. HbA1c 9.6%  Labs 12/28/14: Hemoglobin A1c 9.5% today, compared with 9.5%, at last visit and with 9.9% at the prior visit.  Labs 08/03/14: HbA1c 9.5%; TSH 0.548,free T4 1.10, free T3 3.7, TPO antibody < 1, TSI 30; CMP normal except glucose 191; urinary microalbumin/creatinine ratio 27; cholesterol 260, triglycerides 90, HDL  51, LDL 191   Labs 10/05/13: HbA1c 9.9%  Labs 04/23/13: CMP normal, except glucose 144; cholesterol 216, triglycerides 97, HDL 39, LDL 158; urinary microalbumin/creatinine ratio 6.9; TSH 0.505, free T4 1.17, free T3 3.8  Labs: 01/21/12: TSH 1.251, free T4 1.10, free T3 3.6, CBC normal, CMP normal except for glucose 206, urinary microalbumin/creatinine ratio 6.3, cholesterol 187, triglycerides 67, HDL 45, LDL 129   Assessment and Plan:   ASSESSMENT:  1. T1DM:   A. In 2021, his SGs had been mostly <200, reflecting fairly good DM control, but at the cost of severely curtailing his food intake. [name] had done very well at trying to optimize his BG control. This level of BG control without significant hypoglycemia in October 2021 was a testament to the power of using auto mode in insulin pump-CGM technology and to [name]'s determination to keep his BGs under control.  B. Unfortunately, thereafter he had many site problems and sensor problems. BGs and HbA1c were much higher in February 2022. His HbA1c increased to 7.5%%, compared to 6.2% in October 2021. He was also not having too many low BGs in October.  C. In early April 2022, however, the HbA1c had  decreased to 6.3% and he was having more lower BGs, despite severely reducing the amount of insulin he took daily. As noted above, he was diagnosed with pancreatic exocrine deficiency in April 2022.   D. After starting and increasing Creon, he initially felt somewhat better.Unfortunately, he is now requiring more Creon.   E. His Time in Range and average SG are better, but he has to spend most of his time working on his DM care.  2. Hypoglycemia: He has had a few low BGs this month.  3. Hypertension: His BP is normal today.  4. Combined hyperlipidemia:   A. His lipids in March 2017 were too high, but the lipids in March 2018 were much better on atorvastatin. He discontinued the atorvastatin in mid-2018 due to concern that this medication might be causing his  neuromuscular symptoms.   B. Ms Frederico Hamman re-started his atorvastatin in early 2022. His lipids in March 2022 were worse.  5. Autonomic neuropathy with tachycardia and gastroparesis: His gastroparesis has improved in parallel with his BG improvement. His heart rate has increased to 100 today.   6. Obesity: Weight has decreased too much. He is trying to limit his carb intake in order to  avoid having high BGs.  7. Goiter:  A. His thyroid gland had shrunk back to normal size at a previous visit, but had increased slightly at two previous visits and remains slightly enlarged today. His thyroiditis is clinically quiescent.   B. The active thyroiditis that he has had in the past and the  process of waxing and waning of thyroid gland size are c/w evolving Hashimoto's thyroiditis.   C. He was euthyroid in March 2013, but was borderline hyperthyroid in June 2014. He was euthyroid in October 2015, March 2017, March 2018, July 2018, in March 2019, in March 2020, in July 2021, and in April 2022.  9. Fatigue: This problem is worse due to his pancreatic insufficiency and overall weakness..  10. Obstructive sleep apnea: He did not meet the criteria for OSA, but because of his snoring a dental appliance was suggested. I recommended that he obtain a dental appliance. Unfortunately, his former dentist no longer accepts [employer], so [name] has to find a new dentist. 96. Adult ADD: He had an appointment with Dr. Carlena Hurl, MD in [place] in November 2018. Dr. Wylene Simmer was waiting for [name]'s medical situation to stabilize before beginning any new psych meds.   12. Proliferative diabetic retinopathy: As above.  13. URI/nasal congestion/vertigo/allergic rhinitis and conjunctivitis: He continues to have more of these problems today. The monthly injections had really helped him two years ago. His allergist did not want to re-start the injections giving his other problems. 14. Neuromuscular symptoms and  headaches/increased intracranial pressure:   A. It appeared previously that some of [name]'s headaches were classic tension headaches. When he performed cervical stretching exercises the headache resolved.   B. In the past when Dr. Brett Fairy evaluated [name] for his vague neuromuscular symptoms, she did not find any signs of any serious neurologic disease. His MRI of his brain was unremarkable. At that time his neuromuscular symptoms had resolved.  C. Later, however, the headaches worsened and some of the neuromuscular symptoms recurred. We now know that he had increased ICP and that his headaches improved for a brief time after the LP. He then developed a typical spinal tap headache.  D. In retrospect he may have had intermittent problems with elevated ICP previously.  E. Dr. Brett Fairy suspects that  [name]'s ICP was due to the doxycycline.  F. He appeared to have problems taking his prior dose of Diamox, but is doing better on the combination of a lower dose of Diamox and Topamax.   G. He is being evaluated at [facility] neurology now.  15. Calcified lymph node: It is possible that he may have contracted a fungus while he was working in the [employer]. several years ago. His thyroid US showed his goiter, but no intrinsic thyroid abnormality. A partially calcified cervical lymph node on the right was noted, c/w a post infections/inflammatory node. 16. Microalbuminuria: His ratio was mildly elevated in March 2019. He needed tighter BG and BP control. His ratio in March 2020 and again in July 2021 was very good.  17. Anhedonia: [name] is still depressed, in part because he is getting worse over time. He is tired because he is spending too much time to control his BGs.  He desperately wants to be normal. His sessions with Ms Rea College, RN, MSN, a psychiatric nurse specialist, have been very beneficial.  18. Neck pain/Cervical neuritis/neuromuscular pain: [name] has seen Dr. Barbaraann Barthel who has ordered PT for [name]. [name]  is doing better with PT and his stamina is improving.    19. Cerebral arteriosclerosis: It is unclear to me whether the lesions seen on angiography and MRA are amenable to vascular surgical techniques. 20. Vitamin D deficiency: His 25-OH vitamin D value in April 2022 was mid-normal.   PLAN:  1. Diagnostic: We reviewed his pump and Dexcom printouts today and his HbA1c values for the past year. 2. Therapeutic: He will see Dr. Barbaraann Barthel in Sports Medicine in follow up. He will also see PT and nutrition.  He will follow up with endocrinology at [facility] and [facility]. Continue current pump settings   Basal rates: basal 1 MN:0.99 6 AM: 0.975 2 PM: 1.35 8 PM: 1.30   ISFs: 35   ICRs: MN: 7 8 AM: 4.5 11 AM: 4.5 9 PM: 7   BG targets:  MN: 130-150 6 AM: 120 10 PM 130-150  3. Patient education: We discussed all of the above at great length.  4. Follow up: I told [name] that I will be leaving this practice in October. [name] will follow up at [facility] and [facility]  Level of Service: This visit lasted in excess of 75 minutes. More than 50% of the visit was devoted to coordinating care and documenting this encounter.    Tillman Sers, MD, [facility]

## 2022-05-19 ENCOUNTER — Encounter (INDEPENDENT_AMBULATORY_CARE_PROVIDER_SITE_OTHER): Payer: Self-pay | Admitting: "Endocrinology

## 2022-05-19 ENCOUNTER — Ambulatory Visit (INDEPENDENT_AMBULATORY_CARE_PROVIDER_SITE_OTHER): Payer: BC Managed Care – PPO | Admitting: "Endocrinology

## 2022-05-19 VITALS — BP 112/70 | HR 100 | Wt 198.2 lb

## 2022-05-19 DIAGNOSIS — R519 Headache, unspecified: Secondary | ICD-10-CM

## 2022-05-19 DIAGNOSIS — I10 Essential (primary) hypertension: Secondary | ICD-10-CM

## 2022-05-19 DIAGNOSIS — E1043 Type 1 diabetes mellitus with diabetic autonomic (poly)neuropathy: Secondary | ICD-10-CM

## 2022-05-19 DIAGNOSIS — K8681 Exocrine pancreatic insufficiency: Secondary | ICD-10-CM

## 2022-05-19 DIAGNOSIS — E10649 Type 1 diabetes mellitus with hypoglycemia without coma: Secondary | ICD-10-CM

## 2022-05-19 DIAGNOSIS — E049 Nontoxic goiter, unspecified: Secondary | ICD-10-CM

## 2022-05-19 DIAGNOSIS — G8929 Other chronic pain: Secondary | ICD-10-CM

## 2022-05-19 DIAGNOSIS — E782 Mixed hyperlipidemia: Secondary | ICD-10-CM

## 2022-05-19 DIAGNOSIS — E1065 Type 1 diabetes mellitus with hyperglycemia: Secondary | ICD-10-CM | POA: Diagnosis not present

## 2022-05-19 DIAGNOSIS — R5383 Other fatigue: Secondary | ICD-10-CM

## 2022-05-19 DIAGNOSIS — F411 Generalized anxiety disorder: Secondary | ICD-10-CM | POA: Diagnosis not present

## 2022-05-19 LAB — POCT GLUCOSE (DEVICE FOR HOME USE): POC Glucose: 190 mg/dl — AB (ref 70–99)

## 2022-05-19 LAB — POCT GLYCOSYLATED HEMOGLOBIN (HGB A1C): Hemoglobin A1C: 6.4 % — AB (ref 4.0–5.6)

## 2022-05-19 NOTE — Patient Instructions (Signed)
No further follow up here in this clinic.   At [facility], we are committed to providing exceptional care. You will receive a patient satisfaction survey through text or email regarding your visit today. Your opinion is important to me. Comments are appreciated.

## 2022-05-29 ENCOUNTER — Encounter (INDEPENDENT_AMBULATORY_CARE_PROVIDER_SITE_OTHER): Payer: Self-pay

## 2022-05-29 DIAGNOSIS — Z9641 Presence of insulin pump (external) (internal): Secondary | ICD-10-CM | POA: Diagnosis not present

## 2022-05-29 DIAGNOSIS — Z008 Encounter for other general examination: Secondary | ICD-10-CM | POA: Diagnosis not present

## 2022-05-29 DIAGNOSIS — Z7951 Long term (current) use of inhaled steroids: Secondary | ICD-10-CM | POA: Diagnosis not present

## 2022-05-29 DIAGNOSIS — K8689 Other specified diseases of pancreas: Secondary | ICD-10-CM | POA: Diagnosis not present

## 2022-05-29 DIAGNOSIS — Z794 Long term (current) use of insulin: Secondary | ICD-10-CM | POA: Diagnosis not present

## 2022-05-29 DIAGNOSIS — E162 Hypoglycemia, unspecified: Secondary | ICD-10-CM | POA: Diagnosis not present

## 2022-05-29 DIAGNOSIS — E1040 Type 1 diabetes mellitus with diabetic neuropathy, unspecified: Secondary | ICD-10-CM | POA: Diagnosis not present

## 2022-05-29 DIAGNOSIS — Z79899 Other long term (current) drug therapy: Secondary | ICD-10-CM | POA: Diagnosis not present

## 2022-05-29 DIAGNOSIS — Z6710 Type A blood, Rh positive: Secondary | ICD-10-CM | POA: Diagnosis not present

## 2022-05-29 DIAGNOSIS — Z0183 Encounter for blood typing: Secondary | ICD-10-CM | POA: Diagnosis not present

## 2022-06-03 DIAGNOSIS — E1043 Type 1 diabetes mellitus with diabetic autonomic (poly)neuropathy: Secondary | ICD-10-CM | POA: Diagnosis not present

## 2022-06-03 DIAGNOSIS — R809 Proteinuria, unspecified: Secondary | ICD-10-CM | POA: Diagnosis not present

## 2022-06-03 DIAGNOSIS — Z9641 Presence of insulin pump (external) (internal): Secondary | ICD-10-CM | POA: Diagnosis not present

## 2022-06-03 DIAGNOSIS — K8689 Other specified diseases of pancreas: Secondary | ICD-10-CM | POA: Diagnosis not present

## 2022-06-03 DIAGNOSIS — E1029 Type 1 diabetes mellitus with other diabetic kidney complication: Secondary | ICD-10-CM | POA: Diagnosis not present

## 2022-06-04 ENCOUNTER — Other Ambulatory Visit (INDEPENDENT_AMBULATORY_CARE_PROVIDER_SITE_OTHER): Payer: Self-pay | Admitting: "Endocrinology

## 2022-06-04 DIAGNOSIS — F411 Generalized anxiety disorder: Secondary | ICD-10-CM | POA: Diagnosis not present

## 2022-06-04 DIAGNOSIS — E1069 Type 1 diabetes mellitus with other specified complication: Secondary | ICD-10-CM

## 2022-06-05 ENCOUNTER — Other Ambulatory Visit (INDEPENDENT_AMBULATORY_CARE_PROVIDER_SITE_OTHER): Payer: Self-pay | Admitting: "Endocrinology

## 2022-06-05 DIAGNOSIS — E1069 Type 1 diabetes mellitus with other specified complication: Secondary | ICD-10-CM

## 2022-06-09 ENCOUNTER — Telehealth: Payer: Self-pay

## 2022-06-09 NOTE — Telephone Encounter (Signed)
Patient called in - DOB verified - stated [facility] advised him his insurance doesn't cover AirDuo DigiHaler 113-14 MCG/ACT - coupon is no longer valid. Patient  was advised a PA would done to see if it gets approve - if not, message will be forwarded to provider for alternatives.  Patient verbalized understanding, no further questions.  PA started,,,,  KEY: [id number] - Rx#: [id number] created: 06/09/22  sent to [facility] for review.

## 2022-06-10 ENCOUNTER — Telehealth (INDEPENDENT_AMBULATORY_CARE_PROVIDER_SITE_OTHER): Payer: Self-pay | Admitting: "Endocrinology

## 2022-06-10 ENCOUNTER — Encounter (INDEPENDENT_AMBULATORY_CARE_PROVIDER_SITE_OTHER): Payer: Self-pay | Admitting: Pharmacist

## 2022-06-10 ENCOUNTER — Encounter (INDEPENDENT_AMBULATORY_CARE_PROVIDER_SITE_OTHER): Payer: Self-pay | Admitting: "Endocrinology

## 2022-06-10 DIAGNOSIS — F419 Anxiety disorder, unspecified: Secondary | ICD-10-CM | POA: Diagnosis not present

## 2022-06-10 NOTE — Telephone Encounter (Signed)
[name] called to ask questions about pancreas transplant. [facility] told him that he passed all the tests for a pancreas-alone transplant, but they do not perform enough of this type of transplant to feel comfortable performing such a transplant. The officials at [facility] suggested that I write a letter recommending such a transplant for him in order to treat both his very difficult to treat T1DM and his exocrine pancreatic insufficiency and because a Dexcom won't give him accurate results.  They recommended [facility], [place], and [place].   I agreed. Molli Knock, MD, CDE

## 2022-06-11 DIAGNOSIS — F411 Generalized anxiety disorder: Secondary | ICD-10-CM | POA: Diagnosis not present

## 2022-06-13 NOTE — Telephone Encounter (Signed)
PA was denied for AirDuo stating that preferred alternatives are Advair, Breo, and Dulera. Please advise change in inhaler. Thank You.

## 2022-06-16 ENCOUNTER — Other Ambulatory Visit: Payer: Self-pay | Admitting: *Deleted

## 2022-06-16 MED ORDER — DULERA 100-5 MCG/ACT IN AERO: 2.0000 | INHALATION_SPRAY | Freq: Two times a day (BID) | RESPIRATORY_TRACT | 5 refills | Status: DC

## 2022-06-16 NOTE — Telephone Encounter (Signed)
New prescription has been sent in. Called patient and advised, patient verbalized understanding.  

## 2022-06-17 DIAGNOSIS — L219 Seborrheic dermatitis, unspecified: Secondary | ICD-10-CM | POA: Diagnosis not present

## 2022-06-18 ENCOUNTER — Telehealth (INDEPENDENT_AMBULATORY_CARE_PROVIDER_SITE_OTHER): Payer: BC Managed Care – PPO | Admitting: Pharmacist

## 2022-06-18 ENCOUNTER — Encounter (INDEPENDENT_AMBULATORY_CARE_PROVIDER_SITE_OTHER): Payer: Self-pay | Admitting: Pharmacist

## 2022-06-18 DIAGNOSIS — E1069 Type 1 diabetes mellitus with other specified complication: Secondary | ICD-10-CM

## 2022-06-18 DIAGNOSIS — F411 Generalized anxiety disorder: Secondary | ICD-10-CM | POA: Diagnosis not present

## 2022-06-18 NOTE — Progress Notes (Signed)
This is a [facility] E-Visit (My Chart Video Visit) follow up consult provided via WebEx [name] to an E-Visit consult today.  Location of patient: [name] is at home  Location of provider: Zachery Conch, PharmD, BCACP, CDCES, CPP is at office.   S:     Chief Complaint  Patient presents with   Diabetes    Diabetes Education Follow Up    Endocrinology provider: Dr. Fransico Michael (no upcoming appt)   Patient referred to me by Dr. Fransico Michael for insulin pump initiation and training. I have been assisting with further diabetes management per Dr. Fransico Michael and patient's preference. 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 Patient wears a t:slim X2 insulin pump and Dexcom G6 CGM. Patient was started on t:slim X2 insulin pump on 02/07/21. Patient has been followed for pump adjustments / pump education since initiation of t:slim X2 insulin pump.  I connected with [name] on 06/18/22 by video and verified that I am speaking with the correct person using two identifiers. He would like me to write a letter to one of his new providers regarding his concerns (listed below)  Patient concerns:  - CGM BG reading errors - Scar tissue around abdomen making pump sites challenging - CGM - When he goes outside and Dexcom gets hot the Dexcom will falsely elevate BG readings, leading to automatic correction boluses from control IQ insulin pump. - Issues with MDI with insulin absorption   Insurance [name], [name]  -  [employer] [place] RETAIL ([employer] [place] [employer]) Covered: Retail, Mail Order Unknown: Specialty, Long-Term Care            Member ID: [id number] [birth year] - M     Group ID: [id number]     Group Name: [name] [place], [place] [address]     Pump Settings   Time Basal (Max Basal: 3 units/hr) Correction Factor Carb Ratio (Max Bolus: 12 units)  Target BG  12 AM 0.9 50 10 110  9 AM 1.1 50 9 110  10:30 AM 1.1 100 9 110  5:00 PM 1.1 100 9 110         Total:  24.6 units        Control IQ -205 lbs -TDD 45 units  Pump Serial Number: [id number]   Infusion Set: Autosoft XC, Autosoft 90, TruSteel   Infusion Set Sites  -Patient reports infusion sites are lower back --States when he uses his thighs that they tend to bleed more --Patient reports independently doing infusion set site changes --Patient reports rotating infusion set sites   O:   Labs:   Dexcom Clarity Report     Tconnect Report     There were no vitals filed for this visit.  HbA1c Lab Results  Component Value Date   HGBA1C 6.4 (A) 05/19/2022   HGBA1C 6.2 (A) 08/21/2021   HGBA1C 6.3 (A) 01/30/2021    Pancreatic Islet Cell Autoantibodies No results found for: "ISLETAB"  Insulin Autoantibodies No results found for: "INSULINAB"  Glutamic Acid Decarboxylase Autoantibodies No results found for: "GLUTAMICACAB"  ZnT8 Autoantibodies No results found for: "ZNT8AB"  IA-2 Autoantibodies No results found for: "LABIA2"  C-Peptide Lab Results  Component Value Date   CPEPTIDE <0.1 (L) 01/30/2021    Microalbumin Lab Results  Component Value Date   MICRALBCREAT 2 05/14/2020    Lipids    Component Value Date/Time   CHOL 197 01/24/2021 0853   TRIG 111 01/24/2021 0853   HDL 40 01/24/2021 0853   CHOLHDL 4.9 01/24/2021 0853   VLDL 19 12/29/2016 1146   LDLCALC 135 (H) 01/24/2021 0853    Assessment and Plan:  Advised patient to send me a Mychart documenting issues and I will include it in a letter.   This appointment required 15 minutes of patient care (this includes precharting, chart review, review of results, face-to-face care,  etc.).  Thank you for involving clinical pharmacist/diabetes educator to assist in providing this patient's care.  Zachery Conch, PharmD, BCACP, CDCES, CPP

## 2022-06-23 ENCOUNTER — Ambulatory Visit (INDEPENDENT_AMBULATORY_CARE_PROVIDER_SITE_OTHER): Payer: BC Managed Care – PPO | Admitting: Family Medicine

## 2022-06-23 ENCOUNTER — Other Ambulatory Visit (INDEPENDENT_AMBULATORY_CARE_PROVIDER_SITE_OTHER): Payer: Self-pay | Admitting: "Endocrinology

## 2022-06-23 VITALS — BP 120/64 | Ht 71.75 in | Wt 197.0 lb

## 2022-06-23 DIAGNOSIS — F419 Anxiety disorder, unspecified: Secondary | ICD-10-CM | POA: Diagnosis not present

## 2022-06-23 DIAGNOSIS — L989 Disorder of the skin and subcutaneous tissue, unspecified: Secondary | ICD-10-CM | POA: Diagnosis not present

## 2022-06-23 DIAGNOSIS — K8681 Exocrine pancreatic insufficiency: Secondary | ICD-10-CM | POA: Diagnosis not present

## 2022-06-23 DIAGNOSIS — M791 Myalgia, unspecified site: Secondary | ICD-10-CM | POA: Diagnosis not present

## 2022-06-23 DIAGNOSIS — E1043 Type 1 diabetes mellitus with diabetic autonomic (poly)neuropathy: Secondary | ICD-10-CM | POA: Diagnosis not present

## 2022-06-24 ENCOUNTER — Encounter: Payer: Self-pay | Admitting: Family Medicine

## 2022-06-24 NOTE — Progress Notes (Signed)
PCP: [name], [name], PA-C  Subjective:   HPI: Patient is a 41 y.o. male here for follow-up.  5/31: Patient with no real changes with respect to physical activities. He has visited with [facility] transplant team and undergoing labwork for possible pancreatic transplant - sense he received was that they are 50/50 on whether or not to recommend he go ahead with the transplant. [name] understands the risks and it would greatly improve his quality of life with how much trouble he's had with sites, hypoglycemic episodes. He is to receive a call from them following labwork to see what next steps are and if any additional labwork is needed. He has a scaly lesion on his right shin - believes this is a site where he struck his shin about 2 years ago but this area persists - does not have a dermatologist.  Wound itself healed after this though took a while.  7/14: Patient reports he tried going off pump and using pens but blood sugars stayed up presumably because he had site issues with these as well. He has met with psychologist as part of evaluation for islet cell or pancreas transplant and believes this went well. Group meets monthly to review cases so may hear something in the coming weeks. Has not heard yet about dermatology appointment for lesions on right shin.  8/29: Patient has been seen at [facility] now as evaluation for pancreas transplant. Has seen counselor there, did initially screening. Has appointment on 9/7 to meet with social worker as next step with some testing that day. He meets with the physicians about a week after this. No changes in his physical status aside from losing a little more weight. Continues to struggle with joint pains in lower extremities, hands, shoulders. Has seen dermatologist - notes not available yet from that visit but has just started a topical steroid on right leg lesions.  Past Medical History:  Diagnosis Date   ADHD (attention deficit hyperactivity  disorder)    Anxiety    Asthma    Autonomic neuropathy due to diabetes (HCC)    Chronic headaches    Diabetes (HCC)    Fatigue    GERD (gastroesophageal reflux disease)    Goiter    High blood pressure    High cholesterol    Hypercholesterolemia    Hypertension    Hypoglycemia associated with diabetes (HCC)    Malnutrition (HCC)    Obesity    Sleep apnea    mild, no cpap   Tachycardia    Type 1 diabetes mellitus not at goal (HCC)    Uncontrolled DM with microalbuminuria or microproteinuria     Current Outpatient Medications on File Prior to Visit  Medication Sig Dispense Refill   acetaZOLAMIDE ER (DIAMOX) 500 MG capsule 625 mg. (Patient not taking: Reported on 05/19/2022)     albuterol (VENTOLIN HFA) 108 (90 Base) MCG/ACT inhaler Inhale 2 puffs into the lungs every 4 (four) hours as needed for wheezing or shortness of breath. (Patient not taking: Reported on 05/19/2022) 18 g 1   atorvastatin (LIPITOR) 20 MG tablet Take 20 mg by mouth daily.   0   BAQSIMI TWO PACK 3 MG/DOSE POWD USE 1 SPRAY INTO THE NOSE AS DIRECTED 2 each 3   Continuous Blood Gluc Receiver (DEXCOM G6 RECEIVER) DEVI 1 Device by Does not apply route as directed. 1 each 2   Continuous Blood Gluc Sensor (FREESTYLE LIBRE 2 SENSOR) MISC USE EVERY 14 DAYS. USE IF DEXCOM FAILS (Patient   not taking: Reported on 05/19/2022) 2 each 5   Continuous Blood Gluc Transmit (DEXCOM G6 TRANSMITTER) MISC USE AS DIRECTED WITH SENSOR UP TO 8 TIMES 1 each 0   DULERA 100-5 MCG/ACT AERO Inhale 2 puffs into the lungs 2 (two) times daily. 13 g 5   EPINEPHrine (AUVI-Q) 0.3 mg/0.3 mL IJ SOAJ injection Use as directed for life-threatening allergic reaction. (Patient not taking: Reported on 05/19/2022) 2 each 2   Fluticasone-Salmeterol,sensor, (AIRDUO DIGIHALER) 113-14 MCG/ACT AEPB Inhale 1 puff into the lungs 2 (two) times daily. (Patient not taking: Reported on 05/19/2022) 1 each 5   HUMALOG 100 UNIT/ML injection Use 300 units in insulin pump every  48 hours (Patient not taking: Reported on 05/19/2022) 120 mL 1   insulin aspart (NOVOLOG) 100 UNIT/ML injection Use up to 300 units in pump every 48 hours 120 mL 3   Insulin Lispro Junior KwikPen (HUMALOG JR) 100 UNIT/ML KwikPen ADMINISTER UP TO 50 UNITS UNDER THE SKIN DAILY AS DIRECTED BY PRESCRIBER (Patient not taking: Reported on 05/19/2022) 15 mL 11   Insulin Pen Needle (PEN NEEDLES) 32G X 4 MM MISC Use to inject insulin 6x per day (Patient not taking: Reported on 05/19/2022) 200 each 5   levocetirizine (XYZAL) 5 MG tablet Take 1 tablet (5 mg total) by mouth daily as needed for allergies (Can take an extra dose during flare ups.). (Patient not taking: Reported on 05/19/2022) 60 tablet 5   lipase/protease/amylase (CREON) 36000 UNITS CPEP capsule 3 pills with every meal and 2 pills with every snack 390 capsule 11   losartan (COZAAR) 50 MG tablet TAKE 1 TABLET(50 MG) BY MOUTH DAILY 90 tablet 1   montelukast (SINGULAIR) 10 MG tablet Take 1 tablet (10 mg total) by mouth at bedtime. (Patient not taking: Reported on 05/19/2022) 30 tablet 5   Multiple Vitamin (MULTIVITAMIN WITH MINERALS) TABS tablet Take 1 tablet by mouth daily. (Patient not taking: Reported on 05/19/2022)     NUCALA 100 MG/ML SOAJ Inject 100 mg into the skin every 28 (twenty-eight) days. (Patient not taking: Reported on 05/19/2022)     omeprazole (PRILOSEC) 20 MG capsule Take 1 capsule (20 mg total) by mouth daily. (Patient not taking: Reported on 05/19/2022) 30 capsule 11   ondansetron (ZOFRAN) 4 MG tablet Take one tablet every 8 hours on injection days. (Patient not taking: Reported on 05/19/2022) 20 tablet 3   ONETOUCH VERIO test strip USE 1 STRIP TO TEST BLOOD SUGAR 8 TIMES DAILY 250 strip 3   topiramate (TOPAMAX) 50 MG tablet Take 1 tablet (50 mg total) by mouth 2 (two) times daily. Please call and make overdue appt for further refills. 2nd attempt 30 tablet 0   Vitamin D, Ergocalciferol, (DRISDOL) 1.25 MG (50000 UNIT) CAPS capsule Take  50,000 Units by mouth every 7 (seven) days.     Current Facility-Administered Medications on File Prior to Visit  Medication Dose Route Frequency Provider Last Rate Last Admin   gadopentetate dimeglumine (MAGNEVIST) injection 20 mL  20 mL Intravenous Once PRN Dohmeier, Asencion Partridge, MD       Mepolizumab SOLR 100 mg  100 mg Subcutaneous Q28 days Valentina Shaggy, MD   100 mg at 02/16/19 1018   omalizumab Arvid Right) injection 300 mg  300 mg Subcutaneous Q28 days Jiles Prows, MD   300 mg at 10/24/21 1018   omalizumab Arvid Right) prefilled syringe 300 mg  300 mg Subcutaneous Q28 days Jiles Prows, MD   300 mg at 11/21/21 9563   tezepelumab-ekko (  TEZSPIRE) 210 MG/1.91ML syringe 210 mg  210 mg Subcutaneous Q28 days Kozlow, Eric J, MD   210 mg at 12/31/21 0946    Past Surgical History:  Procedure Laterality Date   EYE SURGERY Right 05/30/2020   Vitrectomy, Dr. Rankin   EYE SURGERY  04/2020   IR 3D INDEPENDENT [facility]  07/16/2020   IR ANGIO INTRA EXTRACRAN SEL COM CAROTID INNOMINATE BILAT MOD SED  07/16/2020   IR ANGIO VERTEBRAL SEL SUBCLAVIAN INNOMINATE UNI L MOD SED  07/16/2020   IR ANGIO VERTEBRAL SEL VERTEBRAL UNI R MOD SED  07/16/2020   IR US GUIDE VASC ACCESS RIGHT  07/16/2020   REFRACTIVE SURGERY     x9   WISDOM TOOTH EXTRACTION      Allergies  Allergen Reactions   Flonase [Fluticasone] Shortness Of Breath   Sulfa Antibiotics     Shortness of breath, increased heart rate; "happened quite a while ago"   Molds & Smuts Other (See Comments)    Aggravate asthma   Sulfites Itching and Swelling   Tetracyclines & Related Other (See Comments)    increased intracranial pressure   Lactose Nausea Only   Lactose Intolerance (Gi) Diarrhea and Nausea And Vomiting    Upset stomach   White Birch Cough    BP 120/64   Ht 5' 11.75" (1.822 m)   Wt 197 lb (89.4 kg)   BMI 26.90 kg/m      09/11/2020    9:45 AM 06/24/2021   10:44 AM 08/05/2021   10:00 AM 09/16/2021   10:08 AM  [facility] Adult Exercise  Frequency of aerobic exercise (# of days/week) 5 3 3 3  Average time in minutes 30 15 15 15  Frequency of strengthening activities (# of days/week) 0 1 1 1        No data to display              Objective:  Physical Exam:  Gen: NAD, comfortable in exam room  Right shin: Scaly lesion anterior right shin without ulceration.   Assessment & Plan:  Type 1 diabetes - multiple site issues.  Trialed off pump and similar problems with this.  He's being evaluated for pancreas transplant.  Letter provided for support.    2. Right lower extremity lesion - on topical steroid now.  F/u with dermatology.  3. Polyarthralgias - related to deconditioning, muscle wasting in relation to issue #1.  Continue with his workouts, exercises as we discussed previously. 

## 2022-06-25 DIAGNOSIS — Z9641 Presence of insulin pump (external) (internal): Secondary | ICD-10-CM | POA: Diagnosis not present

## 2022-06-25 DIAGNOSIS — K8689 Other specified diseases of pancreas: Secondary | ICD-10-CM | POA: Diagnosis not present

## 2022-06-25 DIAGNOSIS — F411 Generalized anxiety disorder: Secondary | ICD-10-CM | POA: Diagnosis not present

## 2022-06-25 DIAGNOSIS — E109 Type 1 diabetes mellitus without complications: Secondary | ICD-10-CM | POA: Diagnosis not present

## 2022-07-01 ENCOUNTER — Encounter (INDEPENDENT_AMBULATORY_CARE_PROVIDER_SITE_OTHER): Payer: Self-pay | Admitting: "Endocrinology

## 2022-07-01 NOTE — Telephone Encounter (Signed)
Sent provider secure chat

## 2022-07-02 DIAGNOSIS — F411 Generalized anxiety disorder: Secondary | ICD-10-CM | POA: Diagnosis not present

## 2022-07-03 DIAGNOSIS — Z01818 Encounter for other preprocedural examination: Secondary | ICD-10-CM | POA: Diagnosis not present

## 2022-07-09 ENCOUNTER — Other Ambulatory Visit (INDEPENDENT_AMBULATORY_CARE_PROVIDER_SITE_OTHER): Payer: Self-pay | Admitting: "Endocrinology

## 2022-07-09 DIAGNOSIS — F411 Generalized anxiety disorder: Secondary | ICD-10-CM | POA: Diagnosis not present

## 2022-07-11 ENCOUNTER — Encounter (INDEPENDENT_AMBULATORY_CARE_PROVIDER_SITE_OTHER): Payer: Self-pay | Admitting: Pharmacist

## 2022-07-11 ENCOUNTER — Other Ambulatory Visit (INDEPENDENT_AMBULATORY_CARE_PROVIDER_SITE_OTHER): Payer: Self-pay

## 2022-07-11 NOTE — Telephone Encounter (Signed)
Sent mychart

## 2022-07-14 DIAGNOSIS — J4530 Mild persistent asthma, uncomplicated: Secondary | ICD-10-CM | POA: Diagnosis not present

## 2022-07-14 DIAGNOSIS — Z6826 Body mass index (BMI) 26.0-26.9, adult: Secondary | ICD-10-CM | POA: Diagnosis not present

## 2022-07-14 DIAGNOSIS — I10 Essential (primary) hypertension: Secondary | ICD-10-CM | POA: Diagnosis not present

## 2022-07-14 DIAGNOSIS — Z01818 Encounter for other preprocedural examination: Secondary | ICD-10-CM | POA: Diagnosis not present

## 2022-07-14 DIAGNOSIS — Z9641 Presence of insulin pump (external) (internal): Secondary | ICD-10-CM | POA: Diagnosis not present

## 2022-07-14 DIAGNOSIS — K8681 Exocrine pancreatic insufficiency: Secondary | ICD-10-CM | POA: Diagnosis not present

## 2022-07-14 DIAGNOSIS — E10319 Type 1 diabetes mellitus with unspecified diabetic retinopathy without macular edema: Secondary | ICD-10-CM | POA: Diagnosis not present

## 2022-07-14 DIAGNOSIS — Z13228 Encounter for screening for other metabolic disorders: Secondary | ICD-10-CM | POA: Diagnosis not present

## 2022-07-14 DIAGNOSIS — E109 Type 1 diabetes mellitus without complications: Secondary | ICD-10-CM | POA: Diagnosis not present

## 2022-07-14 DIAGNOSIS — R9431 Abnormal electrocardiogram [ECG] [EKG]: Secondary | ICD-10-CM | POA: Diagnosis not present

## 2022-07-14 DIAGNOSIS — I4510 Unspecified right bundle-branch block: Secondary | ICD-10-CM | POA: Diagnosis not present

## 2022-07-14 DIAGNOSIS — Z5181 Encounter for therapeutic drug level monitoring: Secondary | ICD-10-CM | POA: Diagnosis not present

## 2022-07-15 DIAGNOSIS — I10 Essential (primary) hypertension: Secondary | ICD-10-CM | POA: Diagnosis not present

## 2022-07-15 DIAGNOSIS — E109 Type 1 diabetes mellitus without complications: Secondary | ICD-10-CM | POA: Diagnosis not present

## 2022-07-15 DIAGNOSIS — E785 Hyperlipidemia, unspecified: Secondary | ICD-10-CM | POA: Diagnosis not present

## 2022-07-16 DIAGNOSIS — F411 Generalized anxiety disorder: Secondary | ICD-10-CM | POA: Diagnosis not present

## 2022-07-17 DIAGNOSIS — E1065 Type 1 diabetes mellitus with hyperglycemia: Secondary | ICD-10-CM | POA: Diagnosis not present

## 2022-07-17 DIAGNOSIS — Z794 Long term (current) use of insulin: Secondary | ICD-10-CM | POA: Diagnosis not present

## 2022-07-17 DIAGNOSIS — Z9641 Presence of insulin pump (external) (internal): Secondary | ICD-10-CM | POA: Diagnosis not present

## 2022-07-17 DIAGNOSIS — E1069 Type 1 diabetes mellitus with other specified complication: Secondary | ICD-10-CM | POA: Diagnosis not present

## 2022-07-23 DIAGNOSIS — F411 Generalized anxiety disorder: Secondary | ICD-10-CM | POA: Diagnosis not present

## 2022-07-24 DIAGNOSIS — L810 Postinflammatory hyperpigmentation: Secondary | ICD-10-CM | POA: Diagnosis not present

## 2022-07-24 DIAGNOSIS — L219 Seborrheic dermatitis, unspecified: Secondary | ICD-10-CM | POA: Diagnosis not present

## 2022-07-30 DIAGNOSIS — F411 Generalized anxiety disorder: Secondary | ICD-10-CM | POA: Diagnosis not present

## 2022-08-01 DIAGNOSIS — Z794 Long term (current) use of insulin: Secondary | ICD-10-CM | POA: Diagnosis not present

## 2022-08-01 DIAGNOSIS — Z01818 Encounter for other preprocedural examination: Secondary | ICD-10-CM | POA: Diagnosis not present

## 2022-08-01 DIAGNOSIS — J4530 Mild persistent asthma, uncomplicated: Secondary | ICD-10-CM | POA: Diagnosis not present

## 2022-08-01 DIAGNOSIS — E109 Type 1 diabetes mellitus without complications: Secondary | ICD-10-CM | POA: Diagnosis not present

## 2022-08-01 DIAGNOSIS — Z01810 Encounter for preprocedural cardiovascular examination: Secondary | ICD-10-CM | POA: Diagnosis not present

## 2022-08-05 DIAGNOSIS — J984 Other disorders of lung: Secondary | ICD-10-CM | POA: Diagnosis not present

## 2022-08-11 ENCOUNTER — Ambulatory Visit: Payer: BC Managed Care – PPO | Admitting: Family Medicine

## 2022-08-11 VITALS — BP 114/64 | Ht 71.75 in

## 2022-08-11 DIAGNOSIS — E11649 Type 2 diabetes mellitus with hypoglycemia without coma: Secondary | ICD-10-CM | POA: Diagnosis not present

## 2022-08-11 DIAGNOSIS — M791 Myalgia, unspecified site: Secondary | ICD-10-CM | POA: Diagnosis not present

## 2022-08-12 NOTE — Progress Notes (Signed)
PCP: Aura Dials, PA-C  Subjective:   HPI: Patient is a 41 y.o. male here for follow-up.  5/31: Patient with no real changes with respect to physical activities. He has visited with [facility] transplant team and undergoing labwork for possible pancreatic transplant - sense he received was that they are 50/50 on whether or not to recommend he go ahead with the transplant. [name] understands the risks and it would greatly improve his quality of life with how much trouble he's had with sites, hypoglycemic episodes. He is to receive a call from them following labwork to see what next steps are and if any additional labwork is needed. He has a scaly lesion on his right shin - believes this is a site where he struck his shin about 2 years ago but this area persists - does not have a dermatologist.  Wound itself healed after this though took a while.  7/14: Patient reports he tried going off pump and using pens but blood sugars stayed up presumably because he had site issues with these as well. He has met with psychologist as part of evaluation for islet cell or pancreas transplant and believes this went well. Group meets monthly to review cases so may hear something in the coming weeks. Has not heard yet about dermatology appointment for lesions on right shin.  8/29: Patient has been seen at [facility] now as evaluation for pancreas transplant. Has seen counselor there, did initially screening. Has appointment on 9/7 to meet with social worker as next step with some testing that day. He meets with the physicians about a week after this. No changes in his physical status aside from losing a little more weight. Continues to struggle with joint pains in lower extremities, hands, shoulders. Has seen dermatologist - notes not available yet from that visit but has just started a topical steroid on right leg lesions.  10/17: Patient continues in his evaluation process at [facility] for possible  pancreas transplant. Advised he should hear something soon as he's completed required testing including stress test, PFTs, labwork. Last night was especially rough - continued concern about low blood sugars.  Difficulty finding sites due to scar tissue. He is eating very little and walking on treadmill, doing exercise videos to try to maintain as much muscle strength as possible. Despite this he continues to lose weight - now just over 190 pounds. A lot of pain especially in his shoulders and hands - similar to his initial presentation to me over a year ago.  Past Medical History:  Diagnosis Date   ADHD (attention deficit hyperactivity disorder)    Anxiety    Asthma    Autonomic neuropathy due to diabetes (HCC)    Chronic headaches    Diabetes (HCC)    Fatigue    GERD (gastroesophageal reflux disease)    Goiter    High blood pressure    High cholesterol    Hypercholesterolemia    Hypertension    Hypoglycemia associated with diabetes (HCC)    Malnutrition (HCC)    Obesity    Sleep apnea    mild, no cpap   Tachycardia    Type 1 diabetes mellitus not at goal [facility])    Uncontrolled DM with microalbuminuria or microproteinuria     Current Outpatient Medications on File Prior to Visit  Medication Sig Dispense Refill   acetaZOLAMIDE ER (DIAMOX) 500 MG capsule 625 mg. (Patient not taking: Reported on 05/19/2022)     albuterol (VENTOLIN HFA) 108 (  90 Base) MCG/ACT inhaler Inhale 2 puffs into the lungs every 4 (four) hours as needed for wheezing or shortness of breath. (Patient not taking: Reported on 05/19/2022) 18 g 1   atorvastatin (LIPITOR) 20 MG tablet Take 20 mg by mouth daily.   0   BAQSIMI TWO PACK 3 MG/DOSE POWD USE 1 SPRAY INTO THE NOSE AS DIRECTED 2 each 3   Continuous Blood Gluc Receiver (DEXCOM G6 RECEIVER) DEVI 1 Device by Does not apply route as directed. 1 each 2   Continuous Blood Gluc Sensor (DEXCOM G6 SENSOR) MISC INJECT 1 SENSOR UNDER THE SKIN AS DIRECTED( CHANGE SENSOR  EVERY 10 DAYS) 3 each 11   Continuous Blood Gluc Sensor (FREESTYLE LIBRE 2 SENSOR) MISC USE EVERY 14 DAYS. USE IF DEXCOM FAILS (Patient not taking: Reported on 05/19/2022) 2 each 5   Continuous Blood Gluc Transmit (DEXCOM G6 TRANSMITTER) MISC USE AS DIRECTED WITH SENSOR UP TO 8 TIMES 1 each 2   DULERA 100-5 MCG/ACT AERO Inhale 2 puffs into the lungs 2 (two) times daily. 13 g 5   EPINEPHrine (AUVI-Q) 0.3 mg/0.3 mL IJ SOAJ injection Use as directed for life-threatening allergic reaction. (Patient not taking: Reported on 05/19/2022) 2 each 2   Fluticasone-Salmeterol,sensor, (AIRDUO DIGIHALER) 113-14 MCG/ACT AEPB Inhale 1 puff into the lungs 2 (two) times daily. (Patient not taking: Reported on 05/19/2022) 1 each 5   HUMALOG 100 UNIT/ML injection Use 300 units in insulin pump every 48 hours (Patient not taking: Reported on 05/19/2022) 120 mL 1   insulin aspart (NOVOLOG) 100 UNIT/ML injection Use up to 300 units in pump every 48 hours 120 mL 3   Insulin Lispro Junior KwikPen (HUMALOG JR) 100 UNIT/ML KwikPen ADMINISTER UP TO 50 UNITS UNDER THE SKIN DAILY AS DIRECTED BY PRESCRIBER (Patient not taking: Reported on 05/19/2022) 15 mL 11   Insulin Pen Needle (PEN NEEDLES) 32G X 4 MM MISC Use to inject insulin 6x per day (Patient not taking: Reported on 05/19/2022) 200 each 5   levocetirizine (XYZAL) 5 MG tablet Take 1 tablet (5 mg total) by mouth daily as needed for allergies (Can take an extra dose during flare ups.). (Patient not taking: Reported on 05/19/2022) 60 tablet 5   lipase/protease/amylase (CREON) 36000 UNITS CPEP capsule 3 pills with every meal and 2 pills with every snack 390 capsule 11   losartan (COZAAR) 50 MG tablet TAKE 1 TABLET(50 MG) BY MOUTH DAILY 90 tablet 1   montelukast (SINGULAIR) 10 MG tablet Take 1 tablet (10 mg total) by mouth at bedtime. (Patient not taking: Reported on 05/19/2022) 30 tablet 5   Multiple Vitamin (MULTIVITAMIN WITH MINERALS) TABS tablet Take 1 tablet by mouth daily. (Patient  not taking: Reported on 05/19/2022)     NUCALA 100 MG/ML SOAJ Inject 100 mg into the skin every 28 (twenty-eight) days. (Patient not taking: Reported on 05/19/2022)     omeprazole (PRILOSEC) 20 MG capsule Take 1 capsule (20 mg total) by mouth daily. (Patient not taking: Reported on 05/19/2022) 30 capsule 11   ondansetron (ZOFRAN) 4 MG tablet Take one tablet every 8 hours on injection days. (Patient not taking: Reported on 05/19/2022) 20 tablet 3   ONETOUCH VERIO test strip USE 1 STRIP TO TEST BLOOD SUGAR 8 TIMES DAILY 250 strip 3   topiramate (TOPAMAX) 50 MG tablet Take 1 tablet (50 mg total) by mouth 2 (two) times daily. Please call and make overdue appt for further refills. 2nd attempt 30 tablet 0   Vitamin D, Ergocalciferol, (  DRISDOL) 1.25 MG (50000 UNIT) CAPS capsule Take 50,000 Units by mouth every 7 (seven) days.     Current Facility-Administered Medications on File Prior to Visit  Medication Dose Route Frequency Provider Last Rate Last Admin   gadopentetate dimeglumine (MAGNEVIST) injection 20 mL  20 mL Intravenous Once PRN Dohmeier, Asencion Partridge, MD       Mepolizumab SOLR 100 mg  100 mg Subcutaneous Q28 days Valentina Shaggy, MD   100 mg at 02/16/19 1018   omalizumab Arvid Right) injection 300 mg  300 mg Subcutaneous Q28 days Jiles Prows, MD   300 mg at 10/24/21 1018   omalizumab Arvid Right) prefilled syringe 300 mg  300 mg Subcutaneous Q28 days Jiles Prows, MD   300 mg at 11/21/21 0956   tezepelumab-ekko (TEZSPIRE) 210 MG/1.91ML syringe 210 mg  210 mg Subcutaneous Q28 days Jiles Prows, MD   210 mg at 12/31/21 1610    Past Surgical History:  Procedure Laterality Date   EYE SURGERY Right 05/30/2020   Vitrectomy, Dr. Zadie Rhine   EYE SURGERY  04/2020   IR 3D INDEPENDENT [place]  07/16/2020   IR ANGIO INTRA EXTRACRAN SEL COM CAROTID INNOMINATE BILAT MOD SED  07/16/2020   IR ANGIO VERTEBRAL SEL SUBCLAVIAN INNOMINATE UNI L MOD SED  07/16/2020   IR ANGIO VERTEBRAL SEL VERTEBRAL UNI R MOD SED  07/16/2020    IR US GUIDE VASC ACCESS RIGHT  07/16/2020   REFRACTIVE SURGERY     x9   WISDOM TOOTH EXTRACTION      Allergies  Allergen Reactions   Flonase [Fluticasone] Shortness Of Breath   Sulfa Antibiotics     Shortness of breath, increased heart rate; "happened quite a while ago"   Molds & Smuts Other (See Comments)    Aggravate asthma   Sulfites Itching and Swelling   Tetracyclines & Related Other (See Comments)    increased intracranial pressure   Lactose Nausea Only   Lactose Intolerance (Gi) Diarrhea and Nausea And Vomiting    Upset stomach   White [place]ch Cough    BP 114/64   Ht 5' 11.75" (1.822 m)   BMI 26.90 kg/m      09/11/2020    9:45 AM 06/24/2021   10:44 AM 08/05/2021   10:00 AM 09/16/2021   10:08 AM  [facility] Adult Exercise  Frequency of aerobic exercise (# of days/week) _0 Average time in minutes _1 Frequency of strengthening activities (# of days/week) 0 _2 No data to display              Objective:  Physical Exam:  Gen: NAD, comfortable in exam room but appears fatigued.  Bilateral shoulders: Muscle atrophy.  No swelling, ecchymoses. Mild diffuse TTP about shoulder, trapezius, rhomboids. FROM. Negative Hawkins, Neers. Negative Yergasons. Strength 5/5 with empty can and resisted internal/external rotation.  Mild pain with these motions. Negative apprehension. NV intact distally.  Bilateral hands: Thenar and hypothenar muscle atrophy.  No swelling, ecchymoses. FROM digits with normal strength. No TTP 1st dorsal compartment, 1st CMC, carpal tunnel. Negative tinels and phalens and finkelsteins.  Assessment & Plan:  Type 1 diabetes - being evaluated for pancreas transplant and sounds like he's near the end of the evaluation process.  Board meeting regularly and should contact him soon - hopefully a good candidate for this.  Continues with weight loss, site issues, blood sugar  lability. Polyarthralgias/myalgias -  due to deconditioning and muscle wasting in relation to issue 1.  Continue workouts as he has been.

## 2022-08-13 DIAGNOSIS — F411 Generalized anxiety disorder: Secondary | ICD-10-CM | POA: Diagnosis not present

## 2022-08-14 DIAGNOSIS — E109 Type 1 diabetes mellitus without complications: Secondary | ICD-10-CM | POA: Diagnosis not present

## 2022-08-20 DIAGNOSIS — F411 Generalized anxiety disorder: Secondary | ICD-10-CM | POA: Diagnosis not present

## 2022-08-20 DIAGNOSIS — K8681 Exocrine pancreatic insufficiency: Secondary | ICD-10-CM | POA: Diagnosis not present

## 2022-08-27 DIAGNOSIS — F411 Generalized anxiety disorder: Secondary | ICD-10-CM | POA: Diagnosis not present

## 2022-09-03 DIAGNOSIS — F411 Generalized anxiety disorder: Secondary | ICD-10-CM | POA: Diagnosis not present

## 2022-09-05 LAB — PROINSULIN/INSULIN RATIO
Insulin: 9.5 u[IU]/mL
Proinsulin: <1.3 pmol/L

## 2022-09-08 ENCOUNTER — Telehealth: Payer: Self-pay | Admitting: *Deleted

## 2022-09-08 DIAGNOSIS — E109 Type 1 diabetes mellitus without complications: Secondary | ICD-10-CM | POA: Diagnosis not present

## 2022-09-08 DIAGNOSIS — K8681 Exocrine pancreatic insufficiency: Secondary | ICD-10-CM | POA: Diagnosis not present

## 2022-09-08 MED ORDER — NUCALA 100 MG/ML ~~LOC~~ SOAJ
100.0000 mg | SUBCUTANEOUS | 11 refills | Status: DC
  Filled 2022-09-08 – 2022-09-09 (×2): qty 1, 28d supply, fill #0
  Filled 2022-11-13: qty 1, 28d supply, fill #1
  Filled 2022-12-18: qty 1, 28d supply, fill #2
  Filled 2023-01-15: qty 1, 28d supply, fill #3
  Filled 2023-02-12 – 2023-03-24 (×3): qty 1, 28d supply, fill #4
  Filled 2023-04-22: qty 1, 28d supply, fill #5
  Filled 2023-05-19: qty 1, 28d supply, fill #6
  Filled 2023-06-18 – 2023-07-07 (×2): qty 1, 28d supply, fill #7

## 2022-09-08 NOTE — Telephone Encounter (Signed)
Called patient and advised change in pharmacy from [facility] to [facility] due to [place] and will send Rx to them

## 2022-09-09 ENCOUNTER — Other Ambulatory Visit (HOSPITAL_COMMUNITY): Payer: Self-pay

## 2022-09-10 DIAGNOSIS — F411 Generalized anxiety disorder: Secondary | ICD-10-CM | POA: Diagnosis not present

## 2022-09-12 DIAGNOSIS — T85614A Breakdown (mechanical) of insulin pump, initial encounter: Secondary | ICD-10-CM | POA: Diagnosis not present

## 2022-09-12 DIAGNOSIS — X58XXXA Exposure to other specified factors, initial encounter: Secondary | ICD-10-CM | POA: Diagnosis not present

## 2022-09-12 DIAGNOSIS — G932 Benign intracranial hypertension: Secondary | ICD-10-CM | POA: Diagnosis not present

## 2022-09-12 DIAGNOSIS — E860 Dehydration: Secondary | ICD-10-CM | POA: Diagnosis not present

## 2022-09-12 DIAGNOSIS — K8689 Other specified diseases of pancreas: Secondary | ICD-10-CM | POA: Diagnosis not present

## 2022-09-12 DIAGNOSIS — E785 Hyperlipidemia, unspecified: Secondary | ICD-10-CM | POA: Diagnosis not present

## 2022-09-12 DIAGNOSIS — Z794 Long term (current) use of insulin: Secondary | ICD-10-CM | POA: Diagnosis not present

## 2022-09-12 DIAGNOSIS — I10 Essential (primary) hypertension: Secondary | ICD-10-CM | POA: Diagnosis not present

## 2022-09-12 DIAGNOSIS — Z79899 Other long term (current) drug therapy: Secondary | ICD-10-CM | POA: Diagnosis not present

## 2022-09-12 DIAGNOSIS — E1065 Type 1 diabetes mellitus with hyperglycemia: Secondary | ICD-10-CM | POA: Diagnosis not present

## 2022-09-12 DIAGNOSIS — J849 Interstitial pulmonary disease, unspecified: Secondary | ICD-10-CM | POA: Diagnosis not present

## 2022-09-12 DIAGNOSIS — J45909 Unspecified asthma, uncomplicated: Secondary | ICD-10-CM | POA: Diagnosis not present

## 2022-09-13 DIAGNOSIS — E109 Type 1 diabetes mellitus without complications: Secondary | ICD-10-CM | POA: Diagnosis not present

## 2022-09-13 DIAGNOSIS — G932 Benign intracranial hypertension: Secondary | ICD-10-CM | POA: Diagnosis not present

## 2022-09-13 DIAGNOSIS — K8689 Other specified diseases of pancreas: Secondary | ICD-10-CM | POA: Diagnosis not present

## 2022-09-15 DIAGNOSIS — F411 Generalized anxiety disorder: Secondary | ICD-10-CM | POA: Diagnosis not present

## 2022-09-22 ENCOUNTER — Ambulatory Visit: Payer: BC Managed Care – PPO | Admitting: Family Medicine

## 2022-09-22 ENCOUNTER — Encounter: Payer: Self-pay | Admitting: Family Medicine

## 2022-09-22 VITALS — BP 118/80 | Ht 72.0 in | Wt 191.0 lb

## 2022-09-22 DIAGNOSIS — E11649 Type 2 diabetes mellitus with hypoglycemia without coma: Secondary | ICD-10-CM | POA: Diagnosis not present

## 2022-09-22 DIAGNOSIS — M791 Myalgia, unspecified site: Secondary | ICD-10-CM

## 2022-09-23 ENCOUNTER — Encounter: Payer: Self-pay | Admitting: Family Medicine

## 2022-09-23 ENCOUNTER — Other Ambulatory Visit (HOSPITAL_COMMUNITY): Payer: Self-pay

## 2022-09-23 ENCOUNTER — Ambulatory Visit (INDEPENDENT_AMBULATORY_CARE_PROVIDER_SITE_OTHER): Payer: BC Managed Care – PPO | Admitting: Allergy and Immunology

## 2022-09-23 VITALS — BP 102/58 | HR 91 | Temp 98.3°F | Resp 18 | Ht 72.0 in | Wt 195.9 lb

## 2022-09-23 DIAGNOSIS — J302 Other seasonal allergic rhinitis: Secondary | ICD-10-CM

## 2022-09-23 DIAGNOSIS — J3089 Other allergic rhinitis: Secondary | ICD-10-CM

## 2022-09-23 DIAGNOSIS — J4550 Severe persistent asthma, uncomplicated: Secondary | ICD-10-CM

## 2022-09-23 MED ORDER — FLUTICASONE-SALMETEROL 250-50 MCG/ACT IN AEPB: 1.0000 | INHALATION_SPRAY | Freq: Two times a day (BID) | RESPIRATORY_TRACT | 5 refills | Status: DC

## 2022-09-23 MED ORDER — ALBUTEROL SULFATE HFA 108 (90 BASE) MCG/ACT IN AERS: 2.0000 | INHALATION_SPRAY | RESPIRATORY_TRACT | 1 refills | Status: DC | PRN

## 2022-09-23 MED ORDER — MONTELUKAST SODIUM 10 MG PO TABS: 10.0000 mg | ORAL_TABLET | Freq: Every day | ORAL | 5 refills | Status: DC

## 2022-09-23 NOTE — Patient Instructions (Addendum)
  1. Continue to Treat and prevent inflammation of airway:   A. montelukast 10 mg tablet once a day  B. Wixela 250 - 1 inhalation 2 times per day  C. Mepolizumab injections monthly   2. If needed:   A. nasal saline / wash  B. OTC antihistamine  C. Proair HFA / Xopenex 2 inhalations or albuterol every 4-6 hours  3. Return in 6 months or earlier if problem

## 2022-09-23 NOTE — Progress Notes (Signed)
PCP: Aura Dials, PA-C  Subjective:   HPI: Patient is a 41 y.o. male here for follow-up.  5/31: Patient with no real changes with respect to physical activities. He has visited with [place] transplant team and undergoing labwork for possible pancreatic transplant - sense he received was that they are 50/50 on whether or not to recommend he go ahead with the transplant. [name] understands the risks and it would greatly improve his quality of life with how much trouble he's had with sites, hypoglycemic episodes. He is to receive a call from them following labwork to see what next steps are and if any additional labwork is needed. He has a scaly lesion on his right shin - believes this is a site where he struck his shin about 2 years ago but this area persists - does not have a dermatologist.  Wound itself healed after this though took a while.  7/14: Patient reports he tried going off pump and using pens but blood sugars stayed up presumably because he had site issues with these as well. He has met with psychologist as part of evaluation for islet cell or pancreas transplant and believes this went well. Group meets monthly to review cases so may hear something in the coming weeks. Has not heard yet about dermatology appointment for lesions on right shin.  8/29: Patient has been seen at [facility] now as evaluation for pancreas transplant. Has seen counselor there, did initially screening. Has appointment on 9/7 to meet with social worker as next step with some testing that day. He meets with the physicians about a week after this. No changes in his physical status aside from losing a little more weight. Continues to struggle with joint pains in lower extremities, hands, shoulders. Has seen dermatologist - notes not available yet from that visit but has just started a topical steroid on right leg lesions.  10/17: Patient continues in his evaluation process at [facility] for possible  pancreas transplant. Advised he should hear something soon as he's completed required testing including stress test, PFTs, labwork. Last night was especially rough - continued concern about low blood sugars.  Difficulty finding sites due to scar tissue. He is eating very little and walking on treadmill, doing exercise videos to try to maintain as much muscle strength as possible. Despite this he continues to lose weight - now just over 190 pounds. A lot of pain especially in his shoulders and hands - similar to his initial presentation to me over a year ago.  11/27: Patient returns without many changes to his status. One positive is he is now on the pancreas transplant waiting list. He reports changing sites up to 7 times a day because despite boluses or insulin delivery his blood sugar does not come down - continued difficulty finding sites. Seemed a little better when he was hydrated with IV fluids on recent hospital visit. Continues walking on treadmill and doing exercise videos - pain is unchanged in hands and shoulders, legs.  Past Medical History:  Diagnosis Date   ADHD (attention deficit hyperactivity disorder)    Anxiety    Asthma    Autonomic neuropathy due to diabetes (HCC)    Chronic headaches    Diabetes (HCC)    Fatigue    GERD (gastroesophageal reflux disease)    Goiter    High blood pressure    High cholesterol    Hypercholesterolemia    Hypertension    Hypoglycemia associated with diabetes ([place])  Malnutrition ([place])    Obesity    Sleep apnea    mild, no cpap   Tachycardia    Type 1 diabetes mellitus not at goal [facility])    Uncontrolled DM with microalbuminuria or microproteinuria     Current Outpatient Medications on File Prior to Visit  Medication Sig Dispense Refill   acetaZOLAMIDE ER (DIAMOX) 500 MG capsule 625 mg. (Patient not taking: Reported on 05/19/2022)     albuterol (VENTOLIN HFA) 108 (90 Base) MCG/ACT inhaler Inhale 2 puffs into the lungs every 4  (four) hours as needed for wheezing or shortness of breath. (Patient not taking: Reported on 05/19/2022) 18 g 1   atorvastatin (LIPITOR) 20 MG tablet Take 20 mg by mouth daily.   0   BAQSIMI TWO PACK 3 MG/DOSE POWD USE 1 SPRAY INTO THE NOSE AS DIRECTED 2 each 3   Continuous Blood Gluc Receiver (DEXCOM G6 RECEIVER) DEVI 1 Device by Does not apply route as directed. 1 each 2   Continuous Blood Gluc Sensor (DEXCOM G6 SENSOR) MISC INJECT 1 SENSOR UNDER THE SKIN AS DIRECTED( CHANGE SENSOR EVERY 10 DAYS) 3 each 11   Continuous Blood Gluc Sensor (FREESTYLE LIBRE 2 SENSOR) MISC USE EVERY 14 DAYS. USE IF DEXCOM FAILS (Patient not taking: Reported on 05/19/2022) 2 each 5   Continuous Blood Gluc Transmit (DEXCOM G6 TRANSMITTER) MISC USE AS DIRECTED WITH SENSOR UP TO 8 TIMES 1 each 2   DULERA 100-5 MCG/ACT AERO Inhale 2 puffs into the lungs 2 (two) times daily. 13 g 5   EPINEPHrine (AUVI-Q) 0.3 mg/0.3 mL IJ SOAJ injection Use as directed for life-threatening allergic reaction. (Patient not taking: Reported on 05/19/2022) 2 each 2   Fluticasone-Salmeterol,sensor, (AIRDUO DIGIHALER) 113-14 MCG/ACT AEPB Inhale 1 puff into the lungs 2 (two) times daily. (Patient not taking: Reported on 05/19/2022) 1 each 5   HUMALOG 100 UNIT/ML injection Use 300 units in insulin pump every 48 hours (Patient not taking: Reported on 05/19/2022) 120 mL 1   insulin aspart (NOVOLOG) 100 UNIT/ML injection Use up to 300 units in pump every 48 hours 120 mL 3   Insulin Lispro Junior KwikPen (HUMALOG JR) 100 UNIT/ML KwikPen ADMINISTER UP TO 50 UNITS UNDER THE SKIN DAILY AS DIRECTED BY PRESCRIBER (Patient not taking: Reported on 05/19/2022) 15 mL 11   Insulin Pen Needle (PEN NEEDLES) 32G X 4 MM MISC Use to inject insulin 6x per day (Patient not taking: Reported on 05/19/2022) 200 each 5   levocetirizine (XYZAL) 5 MG tablet Take 1 tablet (5 mg total) by mouth daily as needed for allergies (Can take an extra dose during flare ups.). (Patient not taking:  Reported on 05/19/2022) 60 tablet 5   lipase/protease/amylase (CREON) 36000 UNITS CPEP capsule 3 pills with every meal and 2 pills with every snack 390 capsule 11   losartan (COZAAR) 50 MG tablet TAKE 1 TABLET(50 MG) BY MOUTH DAILY 90 tablet 1   montelukast (SINGULAIR) 10 MG tablet Take 1 tablet (10 mg total) by mouth at bedtime. (Patient not taking: Reported on 05/19/2022) 30 tablet 5   Multiple Vitamin (MULTIVITAMIN WITH MINERALS) TABS tablet Take 1 tablet by mouth daily. (Patient not taking: Reported on 05/19/2022)     NUCALA 100 MG/ML SOAJ Inject 1 mL (100 mg total) into the skin every 28 (twenty-eight) days. 1 mL 11   omeprazole (PRILOSEC) 20 MG capsule Take 1 capsule (20 mg total) by mouth daily. (Patient not taking: Reported on 05/19/2022) 30 capsule 11   ondansetron (  ZOFRAN) 4 MG tablet Take one tablet every 8 hours on injection days. (Patient not taking: Reported on 05/19/2022) 20 tablet 3   ONETOUCH VERIO test strip USE 1 STRIP TO TEST BLOOD SUGAR 8 TIMES DAILY 250 strip 3   topiramate (TOPAMAX) 50 MG tablet Take 1 tablet (50 mg total) by mouth 2 (two) times daily. Please call and make overdue appt for further refills. 2nd attempt 30 tablet 0   Vitamin D, Ergocalciferol, (DRISDOL) 1.25 MG (50000 UNIT) CAPS capsule Take 50,000 Units by mouth every 7 (seven) days.     Current Facility-Administered Medications on File Prior to Visit  Medication Dose Route Frequency Provider Last Rate Last Admin   gadopentetate dimeglumine (MAGNEVIST) injection 20 mL  20 mL Intravenous Once PRN Dohmeier, Asencion Partridge, MD        Past Surgical History:  Procedure Laterality Date   EYE SURGERY Right 05/30/2020   Vitrectomy, Dr. Zadie Rhine   EYE SURGERY  04/2020   IR 3D INDEPENDENT [place]  07/16/2020   IR ANGIO INTRA EXTRACRAN SEL COM CAROTID INNOMINATE BILAT MOD SED  07/16/2020   IR ANGIO VERTEBRAL SEL SUBCLAVIAN INNOMINATE UNI L MOD SED  07/16/2020   IR ANGIO VERTEBRAL SEL VERTEBRAL UNI R MOD SED  07/16/2020   IR US GUIDE  VASC ACCESS RIGHT  07/16/2020   REFRACTIVE SURGERY     x9   WISDOM TOOTH EXTRACTION      Allergies  Allergen Reactions   Flonase [Fluticasone] Shortness Of Breath   Sulfa Antibiotics     Shortness of breath, increased heart rate; "happened quite a while ago"   Molds & Smuts Other (See Comments)    Aggravate asthma   Sulfites Itching and Swelling   Tetracyclines & Related Other (See Comments)    increased intracranial pressure   Lactose Nausea Only   Lactose Intolerance (Gi) Diarrhea and Nausea And Vomiting    Upset stomach   [place] Cough    BP 118/80   Ht 6' (1.829 m)   Wt 191 lb (86.6 kg)   BMI 25.90 kg/m      09/11/2020    9:45 AM 06/24/2021   10:44 AM 08/05/2021   10:00 AM 09/16/2021   10:08 AM  [facility] Adult Exercise  Frequency of aerobic exercise (# of days/week) _0 Average time in minutes _1 Frequency of strengthening activities (# of days/week) 0 _2 No data to display              Objective:  Physical Exam:  Gen: NAD, comfortable in exam room but fatigued.  Assessment & Plan:  Type 1 diabetes - In positive news he is now on the pancreas transplant waiting list.  Has appointment with endo in about a week.  Polyarthralgias/myalgias - 2/2 deconditioning and muscle wasting in relation to issue 1.  Continue walking on treadmill, light strengthening in his current workouts.  Total visit time 20 minutes including documentation.

## 2022-09-23 NOTE — Progress Notes (Unsigned)
[name]   Follow-up Note  Referring Provider: Lucila Maine Primary Provider: Lucila Maine Date of Office Visit: 09/23/2022  Subjective:   [name] (DOB: 1981/02/28) is a 41 y.o. male who [facility] in re-evaluation of the following:  HPI: [name] returns to this neck in reevaluation of asthma and allergic rhinitis.  His last visit to this clinic was 18 Mar 2022.  He continues to receive mepolizumab injections which has resulted in very good control of his asthma as long as he continues to use a combination inhaler.  Because of an insurance issue his combination inhaler was discontinued and he was given [facility] but unfortunately appeared to have an exaggerated response to the ministration of the formoterol component of Dulera and is not using this medicine.  Thus, he had a little bit more problems with some shortness of breath on occasion.  He had very little issues with his nose.  He has received the flu vaccine and the COVID-vaccine.  Allergies as of 09/23/2022       Reactions   Flonase [fluticasone] Shortness Of Breath   Sulfa Antibiotics    Shortness of breath, increased heart rate; "happened quite a while ago"   Molds & Smuts Other (See Comments)   Aggravate asthma   Sulfites Itching, Swelling   Tetracyclines & Related Other (See Comments)   increased intracranial pressure   Lactose Nausea Only   Lactose Intolerance (gi) Diarrhea, Nausea And Vomiting   Upset stomach   [place] Cough        Medication List    acetaZOLAMIDE ER 500 MG capsule Commonly known as: DIAMOX 625 mg.   AirDuo Digihaler 113-14 MCG/ACT Aepb Generic drug: Fluticasone-Salmeterol(sensor) Inhale 1 puff into the lungs 2 (two) times daily.   albuterol 108 (90 Base) MCG/ACT inhaler Commonly known as: Ventolin HFA Inhale 2 puffs into the lungs every 4 (four) hours as  needed for wheezing or shortness of breath.   atorvastatin 20 MG tablet Commonly known as: LIPITOR Take 20 mg by mouth daily.   Baqsimi Two Pack 3 MG/DOSE Powd Generic drug: Glucagon USE 1 SPRAY INTO THE NOSE AS DIRECTED   Dexcom G6 Receiver Devi 1 Device by Does not apply route as directed.   Dexcom G6 Transmitter Misc USE AS DIRECTED WITH SENSOR UP TO 8 TIMES   Dulera 100-5 MCG/ACT Aero Generic drug: mometasone-formoterol Inhale 2 puffs into the lungs 2 (two) times daily.   EPINEPHrine 0.3 mg/0.3 mL Soaj injection Commonly known as: Auvi-Q Use as directed for life-threatening allergic reaction.   FreeStyle Libre 2 Sensor Misc USE EVERY 14 DAYS. USE IF DEXCOM FAILS   Dexcom G6 Sensor Misc INJECT 1 SENSOR UNDER THE SKIN AS DIRECTED( CHANGE SENSOR EVERY 10 DAYS)   HumaLOG 100 UNIT/ML injection Generic drug: insulin lispro Use 300 units in insulin pump every 48 hours   Insulin Lispro Junior KwikPen 100 UNIT/ML KwikPen Commonly known as: HUMALOG JR ADMINISTER UP TO 50 UNITS UNDER THE SKIN DAILY AS DIRECTED BY PRESCRIBER   insulin aspart 100 UNIT/ML injection Commonly known as: novoLOG Use up to 300 units in pump every 48 hours   levocetirizine 5 MG tablet Commonly known as: Xyzal Take 1 tablet (5 mg total) by mouth daily as needed for allergies (Can take an extra dose during flare ups.).   lipase/protease/amylase 96283 UNITS Cpep capsule Commonly known as: CREON 3  pills with every meal and 2 pills with every snack   losartan 50 MG tablet Commonly known as: COZAAR TAKE 1 TABLET(50 MG) BY MOUTH DAILY   montelukast 10 MG tablet Commonly known as: SINGULAIR Take 1 tablet (10 mg total) by mouth at bedtime.   multivitamin with minerals Tabs tablet Take 1 tablet by mouth daily.   Nucala 100 MG/ML Soaj Generic drug: Mepolizumab Inject 1 mL (100 mg total) into the skin every 28 (twenty-eight) days.   omeprazole 20 MG capsule Commonly known as: PRILOSEC Take 1  capsule (20 mg total) by mouth daily.   ondansetron 4 MG tablet Commonly known as: ZOFRAN Take one tablet every 8 hours on injection days.   OneTouch Verio test strip Generic drug: glucose blood USE 1 STRIP TO TEST BLOOD SUGAR 8 TIMES DAILY   Pen Needles 32G X 4 MM Misc Use to inject insulin 6x per day   topiramate 50 MG tablet Commonly known as: TOPAMAX Take 1 tablet (50 mg total) by mouth 2 (two) times daily. Please call and make overdue appt for further refills. 2nd attempt   Vitamin D (Ergocalciferol) 1.25 MG (50000 UNIT) Caps capsule Commonly known as: DRISDOL Take 50,000 Units by mouth every 7 (seven) days.        Past Medical History:  Diagnosis Date   ADHD (attention deficit hyperactivity disorder)    Anxiety    Asthma    Autonomic neuropathy due to diabetes (HCC)    Chronic headaches    Diabetes (HCC)    Fatigue    GERD (gastroesophageal reflux disease)    Goiter    High blood pressure    High cholesterol    Hypercholesterolemia    Hypertension    Hypoglycemia associated with diabetes (HCC)    Malnutrition (HCC)    Obesity    Sleep apnea    mild, no cpap   Tachycardia    Type 1 diabetes mellitus not at goal [facility])    Uncontrolled DM with microalbuminuria or microproteinuria     Past Surgical History:  Procedure Laterality Date   EYE SURGERY Right 05/30/2020   Vitrectomy, Dr. Luciana Axe   EYE SURGERY  04/2020   IR 3D INDEPENDENT [facility]  07/16/2020   IR ANGIO INTRA EXTRACRAN SEL COM CAROTID INNOMINATE BILAT MOD SED  07/16/2020   IR ANGIO VERTEBRAL SEL SUBCLAVIAN INNOMINATE UNI L MOD SED  07/16/2020   IR ANGIO VERTEBRAL SEL VERTEBRAL UNI R MOD SED  07/16/2020   IR US GUIDE VASC ACCESS RIGHT  07/16/2020   REFRACTIVE SURGERY     x9   WISDOM TOOTH EXTRACTION      Review of systems negative except as noted in HPI / PMHx or noted below:  Review of Systems  Constitutional: Negative.   HENT: Negative.    Eyes: Negative.   Respiratory: Negative.     Cardiovascular: Negative.   Gastrointestinal: Negative.   Genitourinary: Negative.   Musculoskeletal: Negative.   Skin: Negative.   Neurological: Negative.   Endo/Heme/Allergies: Negative.   Psychiatric/Behavioral: Negative.       Objective:   Vitals:   09/23/22 1338  BP: (!) 102/58  Pulse: 91  Resp: 18  Temp: 98.3 F (36.8 C)  SpO2: 98%   Height: 6' (182.9 cm)  Weight: 195 lb 14.4 oz (88.9 kg)   Physical Exam Constitutional:      Appearance: He is not diaphoretic.  HENT:     Head: Normocephalic.     Right Ear: Tympanic membrane, ear  canal and external ear normal.     Left Ear: Tympanic membrane, ear canal and external ear normal.     Nose: Nose normal. No mucosal edema or rhinorrhea.     Mouth/Throat:     Pharynx: Uvula midline. No oropharyngeal exudate.  Eyes:     Conjunctiva/sclera: Conjunctivae normal.  Neck:     Thyroid: No thyromegaly.     Trachea: Trachea normal. No tracheal tenderness or tracheal deviation.  Cardiovascular:     Rate and Rhythm: Normal rate and regular rhythm.     Heart sounds: Normal heart sounds, S1 normal and S2 normal. No murmur heard. Pulmonary:     Effort: No respiratory distress.     Breath sounds: Normal breath sounds. No stridor. No wheezing or rales.  Lymphadenopathy:     Head:     Right side of head: No tonsillar adenopathy.     Left side of head: No tonsillar adenopathy.     Cervical: No cervical adenopathy.  Skin:    Findings: No erythema or rash.     Nails: There is no clubbing.  Neurological:     Mental Status: He is alert.     Diagnostics:    Spirometry was performed and demonstrated an FEV1 of 3.64 at 81 % of predicted.  The patient had an Asthma Control Test with the following results: ACT Total Score: 25.    Assessment and Plan:   1. Not well controlled severe persistent asthma   2. Seasonal and perennial allergic rhinitis    1. Continue to Treat and prevent inflammation of airway:   A. montelukast 10  mg tablet once a day  B. Wixela 250 - 1 inhalation 2 times per day  C. Mepolizumab injections monthly   2. If needed:   A. nasal saline / wash  B. OTC antihistamine  C. Proair HFA / Xopenex 2 inhalations or albuterol every 4-6 hours  3. Return in 6 months or earlier if problem   I will give gave Wixela to use as his controller agent he will continue on mepolizumab and a leukotriene modifier and hopefully this combination of therapy will result in very good control of both his upper and lower fascial.  I will see him back in this clinic in 6 months or earlier if there is a problem.  Laurette Schimke, MD Allergy / Immunology [facility]

## 2022-09-24 ENCOUNTER — Encounter: Payer: Self-pay | Admitting: Allergy and Immunology

## 2022-09-24 DIAGNOSIS — F411 Generalized anxiety disorder: Secondary | ICD-10-CM | POA: Diagnosis not present

## 2022-09-25 DIAGNOSIS — K8681 Exocrine pancreatic insufficiency: Secondary | ICD-10-CM | POA: Diagnosis not present

## 2022-09-25 DIAGNOSIS — Z9289 Personal history of other medical treatment: Secondary | ICD-10-CM | POA: Diagnosis not present

## 2022-09-25 DIAGNOSIS — Z7682 Awaiting organ transplant status: Secondary | ICD-10-CM | POA: Diagnosis not present

## 2022-09-25 DIAGNOSIS — E109 Type 1 diabetes mellitus without complications: Secondary | ICD-10-CM | POA: Diagnosis not present

## 2022-09-25 DIAGNOSIS — I10 Essential (primary) hypertension: Secondary | ICD-10-CM | POA: Diagnosis not present

## 2022-09-29 DIAGNOSIS — E1040 Type 1 diabetes mellitus with diabetic neuropathy, unspecified: Secondary | ICD-10-CM | POA: Diagnosis not present

## 2022-09-30 ENCOUNTER — Telehealth: Payer: BC Managed Care – PPO | Admitting: Family Medicine

## 2022-09-30 DIAGNOSIS — U071 COVID-19: Secondary | ICD-10-CM

## 2022-09-30 MED ORDER — NIRMATRELVIR/RITONAVIR (PAXLOVID)TABLET: 3.0000 | ORAL_TABLET | Freq: Two times a day (BID) | ORAL | 0 refills | Status: AC

## 2022-09-30 NOTE — Progress Notes (Signed)
Virtual Visit Consent   [name], you are scheduled for a virtual visit with a [facility] provider today. Just as with appointments in the office, your consent must be obtained to participate. Your consent will be active for this visit and any virtual visit you may have with one of our providers in the next 365 days. If you have a MyChart account, a copy of this consent can be sent to you electronically.  As this is a virtual visit, video technology does not allow for your provider to perform a traditional examination. This may limit your provider's ability to fully assess your condition. If your provider identifies any concerns that need to be evaluated in person or the need to arrange testing (such as labs, EKG, etc.), we will make arrangements to do so. Although advances in technology are sophisticated, we cannot ensure that it will always work on either your end or our end. If the connection with a video visit is poor, the visit may have to be switched to a telephone visit. With either a video or telephone visit, we are not always able to ensure that we have a secure connection.  By engaging in this virtual visit, you consent to the provision of healthcare and authorize for your insurance to be billed (if applicable) for the services provided during this visit. Depending on your insurance coverage, you may receive a charge related to this service.  I need to obtain your verbal consent now. Are you willing to proceed with your visit today? Adithya Difrancesco has provided verbal consent on 09/30/2022 for a virtual visit (video or telephone). Perlie Mayo, NP  Date: 09/30/2022 11:01 AM  Virtual Visit via Video Note   I, Perlie Mayo, connected with  [name]  ([id number], 05/26/1981) on 09/30/22 at 11:00 AM EST by a video-enabled telemedicine application and verified that I am speaking with the correct person using two identifiers.  Location: Patient: Virtual Visit  Location Patient: Home Provider: Virtual Visit Location Provider: Home Office   I discussed the limitations of evaluation and management by telemedicine and the availability of in person appointments. The patient expressed understanding and agreed to proceed.    History of Present Illness: [name] is a 41 y.o. who identifies as a male who was assigned male at birth, and is being seen today for COVID + on home test- symptoms started with sore throat and aches (unsure if allergies) tested twice negative- today came back positive. Headache, nasal congestion, sore throat, feeling more winded and short of breath than normally. (Has known history of asthma- does not feel it is flared at this time).  Denies chest pain- ear pain. Feeling flushed, unsure of fevers.  eGFR 87 on 09/12/2022- at the [facility]  Is on transplant list for pancreas.  Problems:  Patient Active Problem List   Diagnosis Date Noted   Exocrine pancreatic insufficiency 02/27/2021   Hypoglycemia 01/30/2021   Nuclear sclerotic cataract of both eyes 09/12/2020   Ischemic optic neuritis, right 07/19/2020   Cerebral arteriosclerosis 07/19/2020   Photophobia, left eye 07/19/2020   Optic neuritis, posterior 07/10/2020   Color vision deficiency 07/10/2020   Vitreomacular adhesion of left eye 07/03/2020   H/O visual field defect 06/14/2020   Stable treated proliferative diabetic retinopathy of left eye with macular edema determined by examination associated with type 1 diabetes mellitus ([place]) 05/28/2020   Benign intracranial hypertension 05/28/2020   Stable treated proliferative diabetic retinopathy  of right eye with macular edema determined by examination associated with type 1 diabetes mellitus ([place]) 05/28/2020   Vitreous hemorrhage of right eye ([place]) 05/28/2020   Pain in joint, shoulder region 04/17/2020   Finger numbness 04/17/2020   Moderate persistent asthma without complication 01/60/1093    Contusion of back, right, initial encounter 04/12/2019   Left shoulder pain 04/12/2019   Acute pain of left wrist 04/12/2019   Pseudotumor cerebri syndrome 02/16/2019   Class 1 obesity with serious comorbidity and body mass index (BMI) of 32.0 to 32.9 in adult 02/16/2019   Idiopathic normal pressure hydrocephalus ([place]) 12/15/2018   Severe persistent asthma with acute exacerbation 08/31/2018   Seasonal and perennial allergic rhinitis 08/31/2018   Mild intermittent asthma without complication 23/55/7322   Type 1 diabetes mellitus with other specified complication ([place]) 02/54/2706   Sore throat 04/26/2018   Moderate persistent asthma with acute exacerbation 04/26/2018   Other allergic rhinitis 04/26/2018   Gastroesophageal reflux disease 04/26/2018   Food allergy 04/26/2018   Pseudotumor cerebri 03/25/2018   Morbid obesity with BMI of 40.0-44.9, adult ([place]) 03/25/2018   Altitude sickness, subsequent encounter 03/25/2018   Encounter for medication management 03/25/2018   Snoring 03/25/2018   Increased intracranial pressure 03/20/2018   Low back pain 03/10/2018   Cervicalgia of occipito-atlanto-axial region 08/31/2017   Type 1 diabetes mellitus with complication ([place]) 23/76/2831   Calcified lymph nodes 06/04/2017   Insulin pump in place 05/28/2017   Neuropathy 05/28/2017   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, with proliferative retinopathy ([place]) 05/28/2017   Other symptoms and signs involving the nervous system 05/28/2017   Nightmares REM-sleep type 05/28/2017   Proliferative diabetic retinopathy, right eye ([place]) 04/11/2017   Benign paroxysmal positional vertigo 03/29/2017   Costochondritis 03/06/2017   Acute upper respiratory infection 03/06/2017   Combined hyperlipidemia 02/11/2016   Metatarsalgia of right foot 02/07/2016   Lactose intolerance 01/07/2016   Inappropriate sinus tachycardia 06/29/2015   Thoracic back pain 11/02/2013   Type 1 diabetes mellitus not  at goal [facility])    Goiter    Hypercholesterolemia    ADHD (attention deficit hyperactivity disorder)    Fatigue    Tachycardia    Autonomic neuropathy associated with type 1 diabetes mellitus ([place])    Hypertension    Uncontrolled DM with microalbuminuria or microproteinuria    Hypoglycemia associated with diabetes ([place])    DIABETES MELLITUS, I 12/24/2006   ATTENTION DEFICIT, W/HYPERACTIVITY 12/24/2006    Allergies:  Allergies  Allergen Reactions   Flonase [Fluticasone] Shortness Of Breath   Sulfa Antibiotics     Shortness of breath, increased heart rate; "happened quite a while ago"   Molds & Smuts Other (See Comments)    Aggravate asthma   Sulfites Itching and Swelling   Tetracyclines & Related Other (See Comments)    increased intracranial pressure   Lactose Nausea Only   Lactose Intolerance (Gi) Diarrhea and Nausea And Vomiting    Upset stomach   [place] Cough   Medications:  Current Outpatient Medications:    acetaZOLAMIDE ER (DIAMOX) 500 MG capsule, 625 mg. (Patient not taking: Reported on 05/19/2022), Disp: , Rfl:    albuterol (VENTOLIN HFA) 108 (90 Base) MCG/ACT inhaler, Inhale 2 puffs into the lungs every 4 (four) hours as needed for wheezing or shortness of breath., Disp: 18 g, Rfl: 1   atorvastatin (LIPITOR) 20 MG tablet, Take 20 mg by mouth daily. , Disp: , Rfl: 0   BAQSIMI TWO PACK 3 MG/DOSE  POWD, USE 1 SPRAY INTO THE NOSE AS DIRECTED, Disp: 2 each, Rfl: 3   Continuous Blood Gluc Receiver (DEXCOM G6 RECEIVER) DEVI, 1 Device by Does not apply route as directed., Disp: 1 each, Rfl: 2   Continuous Blood Gluc Sensor (DEXCOM G6 SENSOR) MISC, INJECT 1 SENSOR UNDER THE SKIN AS DIRECTED( CHANGE SENSOR EVERY 10 DAYS), Disp: 3 each, Rfl: 11   Continuous Blood Gluc Sensor (FREESTYLE LIBRE 2 SENSOR) MISC, USE EVERY 14 DAYS. USE IF DEXCOM FAILS (Patient not taking: Reported on 05/19/2022), Disp: 2 each, Rfl: 5   Continuous Blood Gluc Transmit (DEXCOM G6 TRANSMITTER) MISC, USE AS  DIRECTED WITH SENSOR UP TO 8 TIMES, Disp: 1 each, Rfl: 2   EPINEPHrine (AUVI-Q) 0.3 mg/0.3 mL IJ SOAJ injection, Use as directed for life-threatening allergic reaction. (Patient not taking: Reported on 05/19/2022), Disp: 2 each, Rfl: 2   fluticasone-salmeterol (WIXELA INHUB) 250-50 MCG/ACT AEPB, Inhale 1 puff into the lungs in the morning and at bedtime., Disp: 60 each, Rfl: 5   HUMALOG 100 UNIT/ML injection, Use 300 units in insulin pump every 48 hours (Patient not taking: Reported on 05/19/2022), Disp: 120 mL, Rfl: 1   insulin aspart (NOVOLOG) 100 UNIT/ML injection, Use up to 300 units in pump every 48 hours, Disp: 120 mL, Rfl: 3   Insulin Lispro Junior KwikPen (HUMALOG JR) 100 UNIT/ML KwikPen, ADMINISTER UP TO 50 UNITS UNDER THE SKIN DAILY AS DIRECTED BY PRESCRIBER (Patient not taking: Reported on 05/19/2022), Disp: 15 mL, Rfl: 11   Insulin Pen Needle (PEN NEEDLES) 32G X 4 MM MISC, Use to inject insulin 6x per day (Patient not taking: Reported on 05/19/2022), Disp: 200 each, Rfl: 5   levocetirizine (XYZAL) 5 MG tablet, Take 1 tablet (5 mg total) by mouth daily as needed for allergies (Can take an extra dose during flare ups.). (Patient not taking: Reported on 05/19/2022), Disp: 60 tablet, Rfl: 5   lipase/protease/amylase (CREON) 36000 UNITS CPEP capsule, 3 pills with every meal and 2 pills with every snack, Disp: 390 capsule, Rfl: 11   losartan (COZAAR) 50 MG tablet, TAKE 1 TABLET(50 MG) BY MOUTH DAILY, Disp: 90 tablet, Rfl: 1   montelukast (SINGULAIR) 10 MG tablet, Take 1 tablet (10 mg total) by mouth at bedtime., Disp: 30 tablet, Rfl: 5   Multiple Vitamin (MULTIVITAMIN WITH MINERALS) TABS tablet, Take 1 tablet by mouth daily. (Patient not taking: Reported on 05/19/2022), Disp: , Rfl:    NUCALA 100 MG/ML SOAJ, Inject 1 mL (100 mg total) into the skin every 28 (twenty-eight) days., Disp: 1 mL, Rfl: 11   omeprazole (PRILOSEC) 20 MG capsule, Take 1 capsule (20 mg total) by mouth daily. (Patient not taking:  Reported on 05/19/2022), Disp: 30 capsule, Rfl: 11   ondansetron (ZOFRAN) 4 MG tablet, Take one tablet every 8 hours on injection days. (Patient not taking: Reported on 05/19/2022), Disp: 20 tablet, Rfl: 3   ONETOUCH VERIO test strip, USE 1 STRIP TO TEST BLOOD SUGAR 8 TIMES DAILY, Disp: 250 strip, Rfl: 3   topiramate (TOPAMAX) 50 MG tablet, Take 1 tablet (50 mg total) by mouth 2 (two) times daily. Please call and make overdue appt for further refills. 2nd attempt, Disp: 30 tablet, Rfl: 0   Vitamin D, Ergocalciferol, (DRISDOL) 1.25 MG (50000 UNIT) CAPS capsule, Take 50,000 Units by mouth every 7 (seven) days., Disp: , Rfl:  No current facility-administered medications for this visit.  Facility-Administered Medications Ordered in Other Visits:    gadopentetate dimeglumine (MAGNEVIST) injection 20 mL,  20 mL, Intravenous, Once PRN, Dohmeier, Asencion Partridge, MD  Observations/Objective: Patient is well-developed, well-nourished in no acute distress.  Resting comfortably  at home.  Head is normocephalic, atraumatic.  No labored breathing.  Speech is clear and coherent with logical content.  Patient is alert and oriented at baseline.    Assessment and Plan:  1. COVID-19  - nirmatrelvir/ritonavir EUA (PAXLOVID) 20 x 150 MG & 10 x 100MG TABS; Take 3 tablets by mouth 2 (two) times daily for 5 days. (Take nirmatrelvir 150 mg two tablets twice daily for 5 days and ritonavir 100 mg one tablet twice daily for 5 days) Patient GFR is 87  Dispense: 30 tablet; Refill: 0  -given history will start pax- advised to call endocrine team given extensive history and transplant list  -GFR draw at [facility] 09/12/2022 -Covid info reviewed and on AVS   Reviewed side effects, risks and benefits of medication.    Patient acknowledged agreement and understanding of the plan.   Past Medical, Surgical, Social History, Allergies, and Medications have been Reviewed.  Advised of when to be seen in person given asthmas  history as well   Follow Up Instructions: I discussed the assessment and treatment plan with the patient. The patient was provided an opportunity to ask questions and all were answered. The patient agreed with the plan and demonstrated an understanding of the instructions.  A copy of instructions were sent to the patient via MyChart unless otherwise noted below.    The patient was advised to call back or seek an in-person evaluation if the symptoms worsen or if the condition fails to improve as anticipated.  Time:  I spent 10 minutes with the patient via telehealth technology discussing the above problems/concerns.    Perlie Mayo, NP

## 2022-09-30 NOTE — Patient Instructions (Addendum)
Joseph DingwallGabriel [name], thank you for joining Freddy FinnerHannah M Chany Woolworth, NP for today's virtual visit.  While this provider is not your primary care provider (PCP), if your PCP is located in our provider database this encounter information will be shared with them immediately following your visit.   A [facility] MyChart account gives you access to today's visit and all your visits, tests, and labs performed at [facility] " click here if you don't have a [facility] MyChart account or go to [email or web address]  Consent: (Patient) [name] provided verbal consent for this virtual visit at the beginning of the encounter.  Current Medications:  Current Outpatient Medications:    nirmatrelvir/ritonavir EUA (PAXLOVID) 20 x 150 MG & 10 x 100MG  TABS, Take 3 tablets by mouth 2 (two) times daily for 5 days. (Take nirmatrelvir 150 mg two tablets twice daily for 5 days and ritonavir 100 mg one tablet twice daily for 5 days) Patient GFR is 87, Disp: 30 tablet, Rfl: 0   acetaZOLAMIDE ER (DIAMOX) 500 MG capsule, 625 mg. (Patient not taking: Reported on 05/19/2022), Disp: , Rfl:    albuterol (VENTOLIN HFA) 108 (90 Base) MCG/ACT inhaler, Inhale 2 puffs into the lungs every 4 (four) hours as needed for wheezing or shortness of breath., Disp: 18 g, Rfl: 1   atorvastatin (LIPITOR) 20 MG tablet, Take 20 mg by mouth daily. , Disp: , Rfl: 0   BAQSIMI TWO PACK 3 MG/DOSE POWD, USE 1 SPRAY INTO THE NOSE AS DIRECTED, Disp: 2 each, Rfl: 3   Continuous Blood Gluc Receiver (DEXCOM G6 RECEIVER) DEVI, 1 Device by Does not apply route as directed., Disp: 1 each, Rfl: 2   Continuous Blood Gluc Sensor (DEXCOM G6 SENSOR) MISC, INJECT 1 SENSOR UNDER THE SKIN AS DIRECTED( CHANGE SENSOR EVERY 10 DAYS), Disp: 3 each, Rfl: 11   Continuous Blood Gluc Sensor (FREESTYLE LIBRE 2 SENSOR) MISC, USE EVERY 14 DAYS. USE IF DEXCOM FAILS (Patient not taking: Reported on 05/19/2022), Disp: 2 each, Rfl: 5   Continuous Blood Gluc  Transmit (DEXCOM G6 TRANSMITTER) MISC, USE AS DIRECTED WITH SENSOR UP TO 8 TIMES, Disp: 1 each, Rfl: 2   EPINEPHrine (AUVI-Q) 0.3 mg/0.3 mL IJ SOAJ injection, Use as directed for life-threatening allergic reaction. (Patient not taking: Reported on 05/19/2022), Disp: 2 each, Rfl: 2   fluticasone-salmeterol (WIXELA INHUB) 250-50 MCG/ACT AEPB, Inhale 1 puff into the lungs in the morning and at bedtime., Disp: 60 each, Rfl: 5   HUMALOG 100 UNIT/ML injection, Use 300 units in insulin pump every 48 hours (Patient not taking: Reported on 05/19/2022), Disp: 120 mL, Rfl: 1   insulin aspart (NOVOLOG) 100 UNIT/ML injection, Use up to 300 units in pump every 48 hours, Disp: 120 mL, Rfl: 3   Insulin Lispro Junior KwikPen (HUMALOG JR) 100 UNIT/ML KwikPen, ADMINISTER UP TO 50 UNITS UNDER THE SKIN DAILY AS DIRECTED BY PRESCRIBER (Patient not taking: Reported on 05/19/2022), Disp: 15 mL, Rfl: 11   Insulin Pen Needle (PEN NEEDLES) 32G X 4 MM MISC, Use to inject insulin 6x per day (Patient not taking: Reported on 05/19/2022), Disp: 200 each, Rfl: 5   levocetirizine (XYZAL) 5 MG tablet, Take 1 tablet (5 mg total) by mouth daily as needed for allergies (Can take an extra dose during flare ups.). (Patient not taking: Reported on 05/19/2022), Disp: 60 tablet, Rfl: 5   lipase/protease/amylase (CREON) 36000 UNITS CPEP capsule, 3 pills with every meal and 2 pills with every snack, Disp: 390 capsule, Rfl: 11  losartan (COZAAR) 50 MG tablet, TAKE 1 TABLET(50 MG) BY MOUTH DAILY, Disp: 90 tablet, Rfl: 1   montelukast (SINGULAIR) 10 MG tablet, Take 1 tablet (10 mg total) by mouth at bedtime., Disp: 30 tablet, Rfl: 5   Multiple Vitamin (MULTIVITAMIN WITH MINERALS) TABS tablet, Take 1 tablet by mouth daily. (Patient not taking: Reported on 05/19/2022), Disp: , Rfl:    NUCALA 100 MG/ML SOAJ, Inject 1 mL (100 mg total) into the skin every 28 (twenty-eight) days., Disp: 1 mL, Rfl: 11   omeprazole (PRILOSEC) 20 MG capsule, Take 1 capsule (20 mg  total) by mouth daily. (Patient not taking: Reported on 05/19/2022), Disp: 30 capsule, Rfl: 11   ondansetron (ZOFRAN) 4 MG tablet, Take one tablet every 8 hours on injection days. (Patient not taking: Reported on 05/19/2022), Disp: 20 tablet, Rfl: 3   ONETOUCH VERIO test strip, USE 1 STRIP TO TEST BLOOD SUGAR 8 TIMES DAILY, Disp: 250 strip, Rfl: 3   topiramate (TOPAMAX) 50 MG tablet, Take 1 tablet (50 mg total) by mouth 2 (two) times daily. Please call and make overdue appt for further refills. 2nd attempt, Disp: 30 tablet, Rfl: 0   Vitamin D, Ergocalciferol, (DRISDOL) 1.25 MG (50000 UNIT) CAPS capsule, Take 50,000 Units by mouth every 7 (seven) days., Disp: , Rfl:  No current facility-administered medications for this visit.  Facility-Administered Medications Ordered in Other Visits:    gadopentetate dimeglumine (MAGNEVIST) injection 20 mL, 20 mL, Intravenous, Once PRN, Dohmeier, Porfirio Mylar, MD   Medications ordered in this encounter:  Meds ordered this encounter  Medications   nirmatrelvir/ritonavir EUA (PAXLOVID) 20 x 150 MG & 10 x 100MG  TABS    Sig: Take 3 tablets by mouth 2 (two) times daily for 5 days. (Take nirmatrelvir 150 mg two tablets twice daily for 5 days and ritonavir 100 mg one tablet twice daily for 5 days) Patient GFR is 87    Dispense:  30 tablet    Refill:  0    Order Specific Question:   Supervising Provider    Answer:   Merrilee Jansky     *If you need refills on other medications prior to your next appointment, please contact your pharmacy*  Follow-Up: Call back or seek an in-person evaluation if the symptoms worsen or if the condition fails to improve as anticipated.  [facility] [phone]  Other Instructions  Please keep well-hydrated and get plenty of rest. Start a saline nasal rinse to flush out your nasal passages. You can use plain Mucinex to help thin congestion. If you have a humidifier, running in the bedroom at night. I want  you to start OTC vitamin D3 1000 units daily, vitamin C 1000 mg daily, and a zinc supplement. Please take prescribed medications as directed.  You have been enrolled in a MyChart symptom monitoring program. Please answer these questions daily so we can keep track of how you are doing.  You were to quarantine for 5 days from onset of your symptoms.  After day 5, if you have had no fever and you are feeling better, you can end quarantine but need to mask for an additional 5 days. After day 5 if you have a fever or are having significant symptoms, please quarantine for full 10 days.  If you note any worsening of symptoms, any significant shortness of breath or any chest pain, please seek ER evaluation ASAP.  Please do not delay care!  COVID-19: What to Do if You Are Sick  If you test positive and are an older adult or someone who is at high risk of getting very sick from COVID-19, treatment may be available. Contact a healthcare provider right away after a positive test to determine if you are eligible, even if your symptoms are mild right now. You can also visit a Test to Treat location and, if eligible, receive a prescription from a provider. Don't delay: Treatment must be started within the first few days to be effective. If you have a fever, cough, or other symptoms, you might have COVID-19. Most people have mild illness and are able to recover at home. If you are sick: Keep track of your symptoms. If you have an emergency warning sign (including trouble breathing), call 911. Steps to help prevent the spread of COVID-19 if you are sick If you are sick with COVID-19 or think you might have COVID-19, follow the steps below to care for yourself and to help protect other people in your home and community. Stay home except to get medical care Stay home. Most people with COVID-19 have mild illness and can recover at home without medical care. Do not leave your home, except to get medical care. Do not  visit public areas and do not go to places where you are unable to wear a mask. Take care of yourself. Get rest and stay hydrated. Take over-the-counter medicines, such as acetaminophen, to help you feel better. Stay in touch with your doctor. Call before you get medical care. Be sure to get care if you have trouble breathing, or have any other emergency warning signs, or if you think it is an emergency. Avoid public transportation, ride-sharing, or taxis if possible. Get tested If you have symptoms of COVID-19, get tested. While waiting for test results, stay away from others, including staying apart from those living in your household. Get tested as soon as possible after your symptoms start. Treatments may be available for people with COVID-19 who are at risk for becoming very sick. Don't delay: Treatment must be started early to be effective--some treatments must begin within 5 days of your first symptoms. Contact your healthcare provider right away if your test result is positive to determine if you are eligible. Self-tests are one of several options for testing for the virus that causes COVID-19 and may be more convenient than laboratory-based tests and point-of-care tests. Ask your healthcare provider or your local health department if you need help interpreting your test results. You can visit your state, tribal, local, and territorial health department's website to look for the latest local information on testing sites. Separate yourself from other people As much as possible, stay in a specific room and away from other people and pets in your home. If possible, you should use a separate bathroom. If you need to be around other people or animals in or outside of the home, wear a well-fitting mask. Tell your close contacts that they may have been exposed to COVID-19. An infected person can spread COVID-19 starting 48 hours (or 2 days) before the person has any symptoms or tests positive. By letting  your close contacts know they may have been exposed to COVID-19, you are helping to protect everyone. See COVID-19 and Animals if you have questions about pets. If you are diagnosed with COVID-19, someone from the health department may call you. Answer the call to slow the spread. Monitor your symptoms Symptoms of COVID-19 include fever, cough, or other symptoms. Follow care instructions from your healthcare  provider and local health department. Your local health authorities may give instructions on checking your symptoms and reporting information. When to seek emergency medical attention Look for emergency warning signs* for COVID-19. If someone is showing any of these signs, seek emergency medical care immediately: Trouble breathing Persistent pain or pressure in the chest New confusion Inability to wake or stay awake Pale, gray, or blue-colored skin, lips, or nail beds, depending on skin tone *This list is not all possible symptoms. Please call your medical provider for any other symptoms that are severe or concerning to you. Call 911 or call ahead to your local emergency facility: Notify the operator that you are seeking care for someone who has or may have COVID-19. Call ahead before visiting your doctor Call ahead. Many medical visits for routine care are being postponed or done by phone or telemedicine. If you have a medical appointment that cannot be postponed, call your doctor's office, and tell them you have or may have COVID-19. This will help the office protect themselves and other patients. If you are sick, wear a well-fitting mask You should wear a mask if you must be around other people or animals, including pets (even at home). Wear a mask with the best fit, protection, and comfort for you. You don't need to wear the mask if you are alone. If you can't put on a mask (because of trouble breathing, for example), cover your coughs and sneezes in some other way. Try to stay at least  6 feet away from other people. This will help protect the people around you. Masks should not be placed on young children under age 26 years, anyone who has trouble breathing, or anyone who is not able to remove the mask without help. Cover your coughs and sneezes Cover your mouth and nose with a tissue when you cough or sneeze. Throw away used tissues in a lined trash can. Immediately wash your hands with soap and water for at least 20 seconds. If soap and water are not available, clean your hands with an alcohol-based hand sanitizer that contains at least 60% alcohol. Clean your hands often Wash your hands often with soap and water for at least 20 seconds. This is especially important after blowing your nose, coughing, or sneezing; going to the bathroom; and before eating or preparing food. Use hand sanitizer if soap and water are not available. Use an alcohol-based hand sanitizer with at least 60% alcohol, covering all surfaces of your hands and rubbing them together until they feel dry. Soap and water are the best option, especially if hands are visibly dirty. Avoid touching your eyes, nose, and mouth with unwashed hands. Handwashing Tips Avoid sharing personal household items Do not share dishes, drinking glasses, cups, eating utensils, towels, or bedding with other people in your home. Wash these items thoroughly after using them with soap and water or put in the dishwasher. Clean surfaces in your home regularly Clean and disinfect high-touch surfaces (for example, doorknobs, tables, handles, light switches, and countertops) in your "sick room" and bathroom. In shared spaces, you should clean and disinfect surfaces and items after each use by the person who is ill. If you are sick and cannot clean, a caregiver or other person should only clean and disinfect the area around you (such as your bedroom and bathroom) on an as needed basis. Your caregiver/other person should wait as long as possible  (at least several hours) and wear a mask before entering, cleaning, and disinfecting shared  spaces that you use. Clean and disinfect areas that may have blood, stool, or body fluids on them. Use household cleaners and disinfectants. Clean visible dirty surfaces with household cleaners containing soap or detergent. Then, use a household disinfectant. Use a product from [employer] List N: Disinfectants for Coronavirus (COVID-19). Be sure to follow the instructions on the label to ensure safe and effective use of the product. Many products recommend keeping the surface wet with a disinfectant for a certain period of time (look at "contact time" on the product label). You may also need to wear personal protective equipment, such as gloves, depending on the directions on the product label. Immediately after disinfecting, wash your hands with soap and water for 20 seconds. For completed guidance on cleaning and disinfecting your home, visit Complete Disinfection Guidance. Take steps to improve ventilation at home Improve ventilation (air flow) at home to help prevent from spreading COVID-19 to other people in your household. Clear out COVID-19 virus particles in the air by opening windows, using air filters, and turning on fans in your home. Use this interactive tool to learn how to improve air flow in your home. When you can be around others after being sick with COVID-19 Deciding when you can be around others is different for different situations. Find out when you can safely end home isolation. For any additional questions about your care, contact your healthcare provider or state or local health department. 01/15/2021 Content source: [facility] for Immunization and Respiratory Diseases ([facility]), Division of Viral Diseases This information is not intended to replace advice given to you by your health care provider. Make sure you discuss any questions you have with your health care provider. Document  Revised: 02/28/2021 Document Reviewed: 02/28/2021 Elsevier Patient Education  2022 [employer].      If you have been instructed to have an in-person evaluation today at a local Urgent Care facility, please use the link below. It will take you to a list of all of our available [facility], including address, phone number and hours of operation. Please do not delay care.  [facility]  If you or a family member do not have a primary care provider, use the link below to schedule a visit and establish care. When you choose a [facility] primary care physician or advanced practice provider, you gain a long-term partner in health. Find a Primary Care Provider  Learn more about [facility]'s in-office and virtual care options: [facility] Get Care Now

## 2022-10-03 DIAGNOSIS — U071 COVID-19: Secondary | ICD-10-CM | POA: Diagnosis not present

## 2022-10-06 DIAGNOSIS — J0100 Acute maxillary sinusitis, unspecified: Secondary | ICD-10-CM | POA: Diagnosis not present

## 2022-10-06 DIAGNOSIS — L049 Acute lymphadenitis, unspecified: Secondary | ICD-10-CM | POA: Diagnosis not present

## 2022-10-08 DIAGNOSIS — F411 Generalized anxiety disorder: Secondary | ICD-10-CM | POA: Diagnosis not present

## 2022-10-09 DIAGNOSIS — R059 Cough, unspecified: Secondary | ICD-10-CM | POA: Diagnosis not present

## 2022-10-09 DIAGNOSIS — R591 Generalized enlarged lymph nodes: Secondary | ICD-10-CM | POA: Diagnosis not present

## 2022-10-09 DIAGNOSIS — E109 Type 1 diabetes mellitus without complications: Secondary | ICD-10-CM | POA: Diagnosis not present

## 2022-10-09 DIAGNOSIS — R0989 Other specified symptoms and signs involving the circulatory and respiratory systems: Secondary | ICD-10-CM | POA: Diagnosis not present

## 2022-10-09 DIAGNOSIS — I10 Essential (primary) hypertension: Secondary | ICD-10-CM | POA: Diagnosis not present

## 2022-10-09 DIAGNOSIS — R0981 Nasal congestion: Secondary | ICD-10-CM | POA: Diagnosis not present

## 2022-10-11 DIAGNOSIS — E1065 Type 1 diabetes mellitus with hyperglycemia: Secondary | ICD-10-CM | POA: Diagnosis not present

## 2022-10-11 DIAGNOSIS — Z794 Long term (current) use of insulin: Secondary | ICD-10-CM | POA: Diagnosis not present

## 2022-10-11 DIAGNOSIS — E1069 Type 1 diabetes mellitus with other specified complication: Secondary | ICD-10-CM | POA: Diagnosis not present

## 2022-10-11 DIAGNOSIS — Z9641 Presence of insulin pump (external) (internal): Secondary | ICD-10-CM | POA: Diagnosis not present

## 2022-10-13 DIAGNOSIS — E109 Type 1 diabetes mellitus without complications: Secondary | ICD-10-CM | POA: Diagnosis not present

## 2022-10-13 DIAGNOSIS — R221 Localized swelling, mass and lump, neck: Secondary | ICD-10-CM | POA: Diagnosis not present

## 2022-10-13 DIAGNOSIS — F411 Generalized anxiety disorder: Secondary | ICD-10-CM | POA: Diagnosis not present

## 2022-10-14 DIAGNOSIS — R221 Localized swelling, mass and lump, neck: Secondary | ICD-10-CM | POA: Diagnosis not present

## 2022-10-14 DIAGNOSIS — K115 Sialolithiasis: Secondary | ICD-10-CM | POA: Diagnosis not present

## 2022-10-16 ENCOUNTER — Encounter: Payer: Self-pay | Admitting: Allergy and Immunology

## 2022-10-16 ENCOUNTER — Other Ambulatory Visit (HOSPITAL_COMMUNITY): Payer: Self-pay

## 2022-10-23 DIAGNOSIS — U071 COVID-19: Secondary | ICD-10-CM | POA: Diagnosis not present

## 2022-10-23 DIAGNOSIS — K118 Other diseases of salivary glands: Secondary | ICD-10-CM | POA: Diagnosis not present

## 2022-10-24 DIAGNOSIS — K115 Sialolithiasis: Secondary | ICD-10-CM | POA: Diagnosis not present

## 2022-10-30 DIAGNOSIS — E1043 Type 1 diabetes mellitus with diabetic autonomic (poly)neuropathy: Secondary | ICD-10-CM | POA: Diagnosis not present

## 2022-10-30 DIAGNOSIS — Z0000 Encounter for general adult medical examination without abnormal findings: Secondary | ICD-10-CM | POA: Diagnosis not present

## 2022-10-30 DIAGNOSIS — K115 Sialolithiasis: Secondary | ICD-10-CM | POA: Diagnosis not present

## 2022-10-30 DIAGNOSIS — E109 Type 1 diabetes mellitus without complications: Secondary | ICD-10-CM | POA: Diagnosis not present

## 2022-10-30 DIAGNOSIS — Z9641 Presence of insulin pump (external) (internal): Secondary | ICD-10-CM | POA: Diagnosis not present

## 2022-10-30 DIAGNOSIS — Z23 Encounter for immunization: Secondary | ICD-10-CM | POA: Diagnosis not present

## 2022-10-30 DIAGNOSIS — G912 (Idiopathic) normal pressure hydrocephalus: Secondary | ICD-10-CM | POA: Diagnosis not present

## 2022-10-30 DIAGNOSIS — E1029 Type 1 diabetes mellitus with other diabetic kidney complication: Secondary | ICD-10-CM | POA: Diagnosis not present

## 2022-10-30 DIAGNOSIS — R809 Proteinuria, unspecified: Secondary | ICD-10-CM | POA: Diagnosis not present

## 2022-10-30 DIAGNOSIS — E10319 Type 1 diabetes mellitus with unspecified diabetic retinopathy without macular edema: Secondary | ICD-10-CM | POA: Diagnosis not present

## 2022-11-05 DIAGNOSIS — Z9889 Other specified postprocedural states: Secondary | ICD-10-CM | POA: Diagnosis not present

## 2022-11-05 DIAGNOSIS — E113599 Type 2 diabetes mellitus with proliferative diabetic retinopathy without macular edema, unspecified eye: Secondary | ICD-10-CM | POA: Diagnosis not present

## 2022-11-05 DIAGNOSIS — G932 Benign intracranial hypertension: Secondary | ICD-10-CM | POA: Diagnosis not present

## 2022-11-11 DIAGNOSIS — I10 Essential (primary) hypertension: Secondary | ICD-10-CM | POA: Diagnosis not present

## 2022-11-11 DIAGNOSIS — K8689 Other specified diseases of pancreas: Secondary | ICD-10-CM | POA: Diagnosis not present

## 2022-11-11 DIAGNOSIS — I129 Hypertensive chronic kidney disease with stage 1 through stage 4 chronic kidney disease, or unspecified chronic kidney disease: Secondary | ICD-10-CM | POA: Diagnosis not present

## 2022-11-11 DIAGNOSIS — I151 Hypertension secondary to other renal disorders: Secondary | ICD-10-CM | POA: Diagnosis not present

## 2022-11-11 DIAGNOSIS — E10319 Type 1 diabetes mellitus with unspecified diabetic retinopathy without macular edema: Secondary | ICD-10-CM | POA: Diagnosis not present

## 2022-11-11 DIAGNOSIS — K8681 Exocrine pancreatic insufficiency: Secondary | ICD-10-CM | POA: Diagnosis not present

## 2022-11-11 DIAGNOSIS — E7800 Pure hypercholesterolemia, unspecified: Secondary | ICD-10-CM | POA: Diagnosis not present

## 2022-11-11 DIAGNOSIS — Z01818 Encounter for other preprocedural examination: Secondary | ICD-10-CM | POA: Diagnosis not present

## 2022-11-11 DIAGNOSIS — E109 Type 1 diabetes mellitus without complications: Secondary | ICD-10-CM | POA: Diagnosis not present

## 2022-11-13 ENCOUNTER — Other Ambulatory Visit (HOSPITAL_COMMUNITY): Payer: Self-pay

## 2022-11-13 DIAGNOSIS — K115 Sialolithiasis: Secondary | ICD-10-CM | POA: Diagnosis not present

## 2022-11-13 DIAGNOSIS — G932 Benign intracranial hypertension: Secondary | ICD-10-CM | POA: Diagnosis not present

## 2022-11-13 DIAGNOSIS — I10 Essential (primary) hypertension: Secondary | ICD-10-CM | POA: Diagnosis not present

## 2022-11-13 DIAGNOSIS — E11319 Type 2 diabetes mellitus with unspecified diabetic retinopathy without macular edema: Secondary | ICD-10-CM | POA: Diagnosis not present

## 2022-11-13 DIAGNOSIS — K1120 Sialoadenitis, unspecified: Secondary | ICD-10-CM | POA: Diagnosis not present

## 2022-11-13 DIAGNOSIS — Z8616 Personal history of COVID-19: Secondary | ICD-10-CM | POA: Diagnosis not present

## 2022-11-14 ENCOUNTER — Other Ambulatory Visit (HOSPITAL_COMMUNITY): Payer: Self-pay

## 2022-11-17 ENCOUNTER — Ambulatory Visit (INDEPENDENT_AMBULATORY_CARE_PROVIDER_SITE_OTHER): Payer: BC Managed Care – PPO | Admitting: Family Medicine

## 2022-11-17 VITALS — BP 118/72 | Ht 72.0 in | Wt 195.0 lb

## 2022-11-17 DIAGNOSIS — E11649 Type 2 diabetes mellitus with hypoglycemia without coma: Secondary | ICD-10-CM

## 2022-11-17 DIAGNOSIS — M791 Myalgia, unspecified site: Secondary | ICD-10-CM | POA: Diagnosis not present

## 2022-11-18 ENCOUNTER — Encounter: Payer: Self-pay | Admitting: Family Medicine

## 2022-11-18 NOTE — Progress Notes (Signed)
PCP: Aura Dials, PA-C  Subjective:   HPI: Patient is a 42 y.o. male here for follow-up.  11/27: Patient returns without many changes to his status. One positive is he is now on the pancreas transplant waiting list. He reports changing sites up to 7 times a day because despite boluses or insulin delivery his blood sugar does not come down - continued difficulty finding sites. Seemed a little better when he was hydrated with IV fluids on recent hospital visit. Continues walking on treadmill and doing exercise videos - pain is unchanged in hands and shoulders, legs.  1/22: Patient overall about the same. Main complaint today is left shoulder. Pain anterior and posterior. Feels mainly in pec major area from where he points. No swelling or bruising. He is on the pancreas transplant list at [facility]. Has a large ~1cm stone in right parotid - has seen a couple surgeons with recommendation to have the gland removed prior to his transplant - he is thinking about this. Blood sugars have been well controlled though with minimal intake - he is still having issues with sites and reports using his back for sites now.  Past Medical History:  Diagnosis Date   ADHD (attention deficit hyperactivity disorder)    Anxiety    Asthma    Autonomic neuropathy due to diabetes (HCC)    Chronic headaches    Diabetes (HCC)    Fatigue    GERD (gastroesophageal reflux disease)    Goiter    High blood pressure    High cholesterol    Hypercholesterolemia    Hypertension    Hypoglycemia associated with diabetes (HCC)    Malnutrition (HCC)    Obesity    Sleep apnea    mild, no cpap   Tachycardia    Type 1 diabetes mellitus not at goal [facility])    Uncontrolled DM with microalbuminuria or microproteinuria     Current Outpatient Medications on File Prior to Visit  Medication Sig Dispense Refill   acetaZOLAMIDE ER (DIAMOX) 500 MG capsule 625 mg. (Patient not taking: Reported on 05/19/2022)     albuterol  (VENTOLIN HFA) 108 (90 Base) MCG/ACT inhaler Inhale 2 puffs into the lungs every 4 (four) hours as needed for wheezing or shortness of breath. 18 g 1   atorvastatin (LIPITOR) 20 MG tablet Take 20 mg by mouth daily.   0   BAQSIMI TWO PACK 3 MG/DOSE POWD USE 1 SPRAY INTO THE NOSE AS DIRECTED 2 each 3   Continuous Blood Gluc Receiver (DEXCOM G6 RECEIVER) DEVI 1 Device by Does not apply route as directed. 1 each 2   Continuous Blood Gluc Sensor (DEXCOM G6 SENSOR) MISC INJECT 1 SENSOR UNDER THE SKIN AS DIRECTED( CHANGE SENSOR EVERY 10 DAYS) 3 each 11   Continuous Blood Gluc Sensor (FREESTYLE LIBRE 2 SENSOR) MISC USE EVERY 14 DAYS. USE IF DEXCOM FAILS (Patient not taking: Reported on 05/19/2022) 2 each 5   Continuous Blood Gluc Transmit (DEXCOM G6 TRANSMITTER) MISC USE AS DIRECTED WITH SENSOR UP TO 8 TIMES 1 each 2   EPINEPHrine (AUVI-Q) 0.3 mg/0.3 mL IJ SOAJ injection Use as directed for life-threatening allergic reaction. (Patient not taking: Reported on 05/19/2022) 2 each 2   fluticasone-salmeterol (WIXELA INHUB) 250-50 MCG/ACT AEPB Inhale 1 puff into the lungs in the morning and at bedtime. 60 each 5   HUMALOG 100 UNIT/ML injection Use 300 units in insulin pump every 48 hours (Patient not taking: Reported on 05/19/2022) 120 mL 1   insulin  aspart (NOVOLOG) 100 UNIT/ML injection Use up to 300 units in pump every 48 hours 120 mL 3   Insulin Lispro Junior KwikPen (HUMALOG JR) 100 UNIT/ML KwikPen ADMINISTER UP TO 50 UNITS UNDER THE SKIN DAILY AS DIRECTED BY PRESCRIBER (Patient not taking: Reported on 05/19/2022) 15 mL 11   Insulin Pen Needle (PEN NEEDLES) 32G X 4 MM MISC Use to inject insulin 6x per day (Patient not taking: Reported on 05/19/2022) 200 each 5   levocetirizine (XYZAL) 5 MG tablet Take 1 tablet (5 mg total) by mouth daily as needed for allergies (Can take an extra dose during flare ups.). (Patient not taking: Reported on 05/19/2022) 60 tablet 5   lipase/protease/amylase (CREON) 36000 UNITS CPEP  capsule 3 pills with every meal and 2 pills with every snack 390 capsule 11   losartan (COZAAR) 50 MG tablet TAKE 1 TABLET(50 MG) BY MOUTH DAILY 90 tablet 1   montelukast (SINGULAIR) 10 MG tablet Take 1 tablet (10 mg total) by mouth at bedtime. 30 tablet 5   Multiple Vitamin (MULTIVITAMIN WITH MINERALS) TABS tablet Take 1 tablet by mouth daily. (Patient not taking: Reported on 05/19/2022)     NUCALA 100 MG/ML SOAJ Inject 1 mL (100 mg total) into the skin every 28 (twenty-eight) days. 1 mL 11   omeprazole (PRILOSEC) 20 MG capsule Take 1 capsule (20 mg total) by mouth daily. (Patient not taking: Reported on 05/19/2022) 30 capsule 11   ondansetron (ZOFRAN) 4 MG tablet Take one tablet every 8 hours on injection days. (Patient not taking: Reported on 05/19/2022) 20 tablet 3   ONETOUCH VERIO test strip USE 1 STRIP TO TEST BLOOD SUGAR 8 TIMES DAILY 250 strip 3   topiramate (TOPAMAX) 50 MG tablet Take 1 tablet (50 mg total) by mouth 2 (two) times daily. Please call and make overdue appt for further refills. 2nd attempt 30 tablet 0   Vitamin D, Ergocalciferol, (DRISDOL) 1.25 MG (50000 UNIT) CAPS capsule Take 50,000 Units by mouth every 7 (seven) days.     Current Facility-Administered Medications on File Prior to Visit  Medication Dose Route Frequency Provider Last Rate Last Admin   gadopentetate dimeglumine (MAGNEVIST) injection 20 mL  20 mL Intravenous Once PRN Dohmeier, Porfirio Mylar, MD        Past Surgical History:  Procedure Laterality Date   EYE SURGERY Right 05/30/2020   Vitrectomy, Dr. Luciana Axe   EYE SURGERY  04/2020   IR 3D INDEPENDENT WKST  07/16/2020   IR ANGIO INTRA EXTRACRAN SEL COM CAROTID INNOMINATE BILAT MOD SED  07/16/2020   IR ANGIO VERTEBRAL SEL SUBCLAVIAN INNOMINATE UNI L MOD SED  07/16/2020   IR ANGIO VERTEBRAL SEL VERTEBRAL UNI R MOD SED  07/16/2020   IR US GUIDE VASC ACCESS RIGHT  07/16/2020   REFRACTIVE SURGERY     x9   WISDOM TOOTH EXTRACTION      Allergies  Allergen Reactions    Flonase [Fluticasone] Shortness Of Breath   Sulfa Antibiotics     Shortness of breath, increased heart rate; "happened quite a while ago"   Molds & Smuts Other (See Comments)    Aggravate asthma   Sulfites Itching and Swelling   Tetracyclines & Related Other (See Comments)    increased intracranial pressure   Lactose Nausea Only   Lactose Intolerance (Gi) Diarrhea and Nausea And Vomiting    Upset stomach   [place] Cough    BP 118/72   Ht 6' (1.829 m)   Wt 195 lb (88.5 kg)  BMI 26.45 kg/m      09/11/2020    9:45 AM 06/24/2021   10:44 AM 08/05/2021   10:00 AM 09/16/2021   10:08 AM  [facility] Adult Exercise  Frequency of aerobic exercise (# of days/week) 5 3 3 3   Average time in minutes 30 15 15 15   Frequency of strengthening activities (# of days/week) 0 1 1 1         No data to display              Objective:  Physical Exam:  Gen: NAD, comfortable in exam room  Left shoulder: Mild atrophy, generalized.  No swelling, ecchymoses.  No gross deformity. Mild TTP anterior and posterior shoulder over glenohumeral joint, over pec major. FROM with 5/5 strength all motions. Negative Hawkins, Neers. Negative Yergasons. Strength 5/5 with empty can and resisted internal/external rotation. Negative apprehension. NV intact distally.   Assessment & Plan:  Type 1 diabetes - On pancreas transplant waiting list at [facility].  Undergoing evaluation for same at [facility] to get on list there also. Left shoulder pain - exam reassuring.  2/2 deconditioning and muscle wasting.  No evidence rotator cuff tear or other pathology.  Total visit time 20 minutes including documentation.

## 2022-11-19 DIAGNOSIS — F411 Generalized anxiety disorder: Secondary | ICD-10-CM | POA: Diagnosis not present

## 2022-11-19 DIAGNOSIS — Z794 Long term (current) use of insulin: Secondary | ICD-10-CM | POA: Diagnosis not present

## 2022-11-19 DIAGNOSIS — E1165 Type 2 diabetes mellitus with hyperglycemia: Secondary | ICD-10-CM | POA: Diagnosis not present

## 2022-11-21 ENCOUNTER — Other Ambulatory Visit (HOSPITAL_COMMUNITY): Payer: Self-pay

## 2022-11-25 ENCOUNTER — Other Ambulatory Visit: Payer: Self-pay

## 2022-11-25 ENCOUNTER — Encounter: Payer: Self-pay | Admitting: Family Medicine

## 2022-11-25 DIAGNOSIS — M25512 Pain in left shoulder: Secondary | ICD-10-CM

## 2022-11-26 DIAGNOSIS — F411 Generalized anxiety disorder: Secondary | ICD-10-CM | POA: Diagnosis not present

## 2022-12-02 ENCOUNTER — Other Ambulatory Visit: Payer: Self-pay | Admitting: Allergy and Immunology

## 2022-12-03 DIAGNOSIS — F411 Generalized anxiety disorder: Secondary | ICD-10-CM | POA: Diagnosis not present

## 2022-12-10 DIAGNOSIS — F411 Generalized anxiety disorder: Secondary | ICD-10-CM | POA: Diagnosis not present

## 2022-12-17 DIAGNOSIS — F411 Generalized anxiety disorder: Secondary | ICD-10-CM | POA: Diagnosis not present

## 2022-12-18 ENCOUNTER — Other Ambulatory Visit (HOSPITAL_COMMUNITY): Payer: Self-pay

## 2022-12-19 ENCOUNTER — Other Ambulatory Visit (HOSPITAL_COMMUNITY): Payer: Self-pay

## 2022-12-22 ENCOUNTER — Other Ambulatory Visit: Payer: Self-pay

## 2022-12-24 DIAGNOSIS — F411 Generalized anxiety disorder: Secondary | ICD-10-CM | POA: Diagnosis not present

## 2022-12-24 NOTE — Therapy (Signed)
OUTPATIENT PHYSICAL THERAPY SHOULDER EVALUATION   Patient Name: [name] MRN: [id number] DOB:04/14/1981, 42 y.o., male Today's Date: 12/26/2022  END OF SESSION:  PT End of Session - 12/25/22 1431     Visit Number 1    Number of Visits 5    Date for PT Re-Evaluation 02/27/23    Authorization Type BCBS COMM PPO    PT Start Time 13    PT Stop Time 1505    PT Time Calculation (min) 40 min    Activity Tolerance Patient tolerated treatment well    Behavior During Therapy WFL for tasks assessed/performed             Past Medical History:  Diagnosis Date   ADHD (attention deficit hyperactivity disorder)    Anxiety    Asthma    Autonomic neuropathy due to diabetes ([place])    Chronic headaches    Diabetes ([place])    Fatigue    GERD (gastroesophageal reflux disease)    Goiter    High blood pressure    High cholesterol    Hypercholesterolemia    Hypertension    Hypoglycemia associated with diabetes ([place])    Malnutrition ([place])    Obesity    Sleep apnea    mild, no cpap   Tachycardia    Type 1 diabetes mellitus not at goal [facility])    Uncontrolled DM with microalbuminuria or microproteinuria    Past Surgical History:  Procedure Laterality Date   EYE SURGERY Right 05/30/2020   Vitrectomy, Dr. Zadie Rhine   EYE SURGERY  04/2020   IR 3D INDEPENDENT [place]  07/16/2020   IR ANGIO INTRA EXTRACRAN SEL COM CAROTID INNOMINATE BILAT MOD SED  07/16/2020   IR ANGIO VERTEBRAL SEL SUBCLAVIAN INNOMINATE UNI L MOD SED  07/16/2020   IR ANGIO VERTEBRAL SEL VERTEBRAL UNI R MOD SED  07/16/2020   IR US GUIDE VASC ACCESS RIGHT  07/16/2020   REFRACTIVE SURGERY     x9   WISDOM TOOTH EXTRACTION     Patient Active Problem List   Diagnosis Date Noted   Exocrine pancreatic insufficiency 02/27/2021   Hypoglycemia 01/30/2021   Nuclear sclerotic cataract of both eyes 09/12/2020   Ischemic optic neuritis, right 07/19/2020   Cerebral arteriosclerosis 07/19/2020   Photophobia, left eye 07/19/2020    Optic neuritis, posterior 07/10/2020   Color vision deficiency 07/10/2020   Vitreomacular adhesion of left eye 07/03/2020   H/O visual field defect 06/14/2020   Stable treated proliferative diabetic retinopathy of left eye with macular edema determined by examination associated with type 1 diabetes mellitus ([place]) 05/28/2020   Benign intracranial hypertension 05/28/2020   Stable treated proliferative diabetic retinopathy of right eye with macular edema determined by examination associated with type 1 diabetes mellitus ([place]) 05/28/2020   Vitreous hemorrhage of right eye ([place]) 05/28/2020   Pain in joint, shoulder region 04/17/2020   Finger numbness 04/17/2020   Moderate persistent asthma without complication [id number]   Contusion of back, right, initial encounter 04/12/2019   Left shoulder pain 04/12/2019   Acute pain of left wrist 04/12/2019   Pseudotumor cerebri syndrome 02/16/2019   Class 1 obesity with serious comorbidity and body mass index (BMI) of 32.0 to 32.9 in adult 02/16/2019   Idiopathic normal pressure hydrocephalus ([place]) 12/15/2018   Severe persistent asthma with acute exacerbation 08/31/2018   Seasonal and perennial allergic rhinitis 08/31/2018   Mild intermittent asthma without complication [id number]   Type 1 diabetes mellitus with other specified complication ([place]) [id number]  Sore throat 04/26/2018   Moderate persistent asthma with acute exacerbation 04/26/2018   Other allergic rhinitis 04/26/2018   Gastroesophageal reflux disease 04/26/2018   Food allergy 04/26/2018   Pseudotumor cerebri 03/25/2018   Morbid obesity with BMI of 40.0-44.9, adult ([place]) 03/25/2018   [place] sickness, subsequent encounter 03/25/2018   Encounter for medication management 03/25/2018   Snoring 03/25/2018   Increased intracranial pressure 03/20/2018   Low back pain 03/10/2018   Cervicalgia of occipito-atlanto-axial region 08/31/2017   Type 1 diabetes mellitus with complication ([place])  [id number]   Calcified lymph nodes 06/04/2017   Insulin pump in place 05/28/2017   Neuropathy 05/28/2017   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, with proliferative retinopathy ([place]) 05/28/2017   Other symptoms and signs involving the nervous system 05/28/2017   Nightmares REM-sleep type 05/28/2017   Proliferative diabetic retinopathy, right eye ([place]) 04/11/2017   Benign paroxysmal positional vertigo 03/29/2017   Costochondritis 03/06/2017   Acute upper respiratory infection 03/06/2017   Combined hyperlipidemia 02/11/2016   Metatarsalgia of right foot 02/07/2016   Lactose intolerance 01/07/2016   Inappropriate sinus tachycardia 06/29/2015   Thoracic back pain 11/02/2013   Type 1 diabetes mellitus not at goal [facility])    Goiter    Hypercholesterolemia    ADHD (attention deficit hyperactivity disorder)    Fatigue    Tachycardia    Autonomic neuropathy associated with type 1 diabetes mellitus ([place])    Hypertension    Uncontrolled DM with microalbuminuria or microproteinuria    Hypoglycemia associated with diabetes ([place])    DIABETES MELLITUS, I 12/24/2006   ATTENTION DEFICIT, W/HYPERACTIVITY 12/24/2006    PCP: Aura Dials, PA-C  REFERRING PROVIDER: Dene Gentry, MD  REFERRING DIAG: [phone] (ICD-10-CM) - Left shoulder pain, unspecified chronicity   THERAPY DIAG:  Chronic left shoulder pain  Muscle weakness (generalized)  Rationale for Evaluation and Treatment: Rehabilitation  ONSET DATE: Chronic  SUBJECTIVE:                                                                                                                                                                                      SUBJECTIVE STATEMENT: Pt reports having L shoulder girdle pain from his L pectoral area to his upper back. He notices the pain with picking up heavier objects or pressing down with hands to assist with standing. Pt states the pain is associated with muscle wasting due to  pancreatic insufficiency. Pt hopes measured strengthening will improve the pain and function without negative impact.   PERTINENT HISTORY: Uncontrolled DM with microalbuminuria or microproteinuria, ADHD, anxiety, Autonomic neuropathy due to diabetes, HTN   PAIN:  Are you having pain? Yes:  NPRS scale: 5/10 Pain location: L pectoral to upper back Pain description: ache Aggravating factors: straining L UE  Relieving factors: Rest  PRECAUTIONS: None  WEIGHT BEARING RESTRICTIONS: No  FALLS:  Has patient fallen in last 6 months? No  LIVING ENVIRONMENT: Lives with: lives with their family Lives in: House/apartment No issue with accessing or mobility within home  OCCUPATION: Engineer, production  PLOF: Independent  PATIENT GOALS:maintain strength and reduce pain  NEXT MD VISIT:   OBJECTIVE:   DIAGNOSTIC FINDINGS:  NA  PATIENT SURVEYS:   FOTO: Perceived function  61%, predicted 75%   COGNITION: Overall cognitive status: Within functional limits for tasks assessed     SENSATION: WFL  POSTURE: Increased thoracic kyphosis, rounded shoulders, winging scapula L>R  UPPER EXTREMITY ROM:   Active ROM Right eval Left eval  Shoulder flexion decreased decreased  Shoulder extension    Shoulder abduction decreased decreased  Shoulder adduction    Shoulder internal rotation    Shoulder external rotation decreased decreased  Elbow flexion    Elbow extension    Wrist flexion    Wrist extension    Wrist ulnar deviation    Wrist radial deviation    Wrist pronation    Wrist supination    (Blank rows = not tested)  UPPER EXTREMITY MMT:  MMT Right eval Left eval  Shoulder flexion 4 4-  Shoulder extension    Shoulder abduction 4 4-  Shoulder adduction    Shoulder internal rotation 5 5  Shoulder external rotation 4 4_  Middle trapezius    Lower trapezius    Elbow flexion    Elbow extension    Wrist flexion    Wrist extension    Wrist ulnar deviation     Wrist radial deviation    Wrist pronation    Wrist supination    Grip strength (lbs)    (Blank rows = not tested)  SHOULDER SPECIAL TESTS: Impingement tests: Hawkins/Kennedy impingement test: negative Rotator cuff assessment: Empty can test: negative and Full can test: negative  PALPATION:  No overt area of tenderness   TODAY'S TREATMENT:                                                                                                                                          [facility] Adult PT Treatment:                                                DATE: 12/25/22 Therapeutic Exercise: Developed, instructed in, and pt completed therex as noted in HEP   PATIENT EDUCATION: Education details: Eval findings, POC, HEP  Person educated: Patient Education method: Explanation, Demonstration, Tactile cues, Verbal cues, and Handouts Education comprehension: verbalized understanding, returned demonstration, verbal cues required, and tactile cues  required  HOME EXERCISE PROGRAM: Access Code: [email or web address] URL: [email or web address] Date: 12/25/2022 Prepared by: Gar Ponto  Exercises - Standing Shoulder Row with Anchored Resistance  - 1 x daily - 7 x weekly - 2 sets - 10 reps - Shoulder Extension with Resistance  - 1 x daily - 7 x weekly - 2 sets - 10 reps - Shoulder External Rotation and Scapular Retraction with Resistance  - 1 x daily - 7 x weekly - 2 sets - 10 reps - Standing Shoulder Horizontal Abduction with Resistance  - 1 x daily - 7 x weekly - 2 sets - 10 reps - Doorway Pec Stretch at 90 Degrees Abduction  - 1 x daily - 7 x weekly - 1 sets - 3 reps - 30 hold  ASSESSMENT:  CLINICAL IMPRESSION: Patient is a 42 y.o. male who was seen today for physical therapy evaluation and treatment for M25.512 (ICD-10-CM) - Left shoulder pain, unspecified chronicity. Pt presents c L shoulder weakness, pain and function related to muscle wasting secondary to pancreatic insufficiency. Pt will  benefit from skilled PT to address impairments for improved function of the L shoulder with less pain.  OBJECTIVE IMPAIRMENTS: decreased activity tolerance, decreased ROM, decreased strength, impaired UE functional use, postural dysfunction, obesity, and pain.   ACTIVITY LIMITATIONS: carrying, lifting, and transfers  PARTICIPATION LIMITATIONS: meal prep, cleaning, laundry, and yard work  PERSONAL FACTORS: Fitness, Past/current experiences, Time since onset of injury/illness/exacerbation, and 3+ comorbidities: Uncontrolled DM with microalbuminuria or microproteinuria, ADHD, anxiety, Autonomic neuropathy due to diabetes, HTN   are also affecting patient's functional outcome.   REHAB POTENTIAL: Good  CLINICAL DECISION MAKING: Evolving/moderate complexity  EVALUATION COMPLEXITY: Moderate   GOALS:  SHORT TERM GOALS: Target date: 01/16/23  Pt will be Ind in an initial HEP  Baseline: initiated Goal status: INITIAL  LONG TERM GOALS: Target date: 02/27/23  Pt will be Ind in a final HEP to maintain achieved LOF Baseline: initiated Goal status: INITIAL  2.  Increase L and R shoulder strength by 1/2 muscle grade for improved function with less pain Baseline: see flow sheet Goal status: INITIAL  3.  Pt will report a decrease in L shoulder pain to 2/10 or less for improved function and QOL Baseline: 5/10 Goal status: INITIAL  4.  Pt's FOTO score will improved to the predicted value of 75% as indication of improved function  Baseline:61% Goal status: INITIAL  PLAN:  PT FREQUENCY:  1 visit/ 2 weeks  PT DURATION: 8 weeks  PLANNED INTERVENTIONS: Therapeutic exercises, Therapeutic activity, Patient/Family education, Self Care, Joint mobilization, Aquatic Therapy, Dry Needling, Electrical stimulation, Cryotherapy, Moist heat, Taping, Ultrasound, Ionotophoresis '4mg'$ /ml Dexamethasone, Manual therapy, and Re-evaluation  PLAN FOR NEXT SESSION: Review FOTO; assess response to HEP; progress  therex as indicated; use of modalities, manual therapy; and TPDN as indicated.    Kingston Shawgo MS, PT 12/26/22 9:06 PM

## 2022-12-25 ENCOUNTER — Ambulatory Visit: Payer: BC Managed Care – PPO | Attending: Family Medicine

## 2022-12-25 ENCOUNTER — Other Ambulatory Visit: Payer: Self-pay

## 2022-12-25 DIAGNOSIS — G8929 Other chronic pain: Secondary | ICD-10-CM | POA: Diagnosis not present

## 2022-12-25 DIAGNOSIS — M25512 Pain in left shoulder: Secondary | ICD-10-CM | POA: Insufficient documentation

## 2022-12-25 DIAGNOSIS — M6281 Muscle weakness (generalized): Secondary | ICD-10-CM | POA: Diagnosis not present

## 2022-12-31 DIAGNOSIS — F411 Generalized anxiety disorder: Secondary | ICD-10-CM | POA: Diagnosis not present

## 2022-12-31 DIAGNOSIS — Z01818 Encounter for other preprocedural examination: Secondary | ICD-10-CM | POA: Diagnosis not present

## 2023-01-07 ENCOUNTER — Ambulatory Visit (INDEPENDENT_AMBULATORY_CARE_PROVIDER_SITE_OTHER): Payer: BC Managed Care – PPO | Admitting: Family Medicine

## 2023-01-07 VITALS — BP 114/70 | Ht 72.0 in | Wt 193.0 lb

## 2023-01-07 DIAGNOSIS — M25511 Pain in right shoulder: Secondary | ICD-10-CM

## 2023-01-07 DIAGNOSIS — E11649 Type 2 diabetes mellitus with hypoglycemia without coma: Secondary | ICD-10-CM

## 2023-01-07 DIAGNOSIS — F411 Generalized anxiety disorder: Secondary | ICD-10-CM | POA: Diagnosis not present

## 2023-01-07 NOTE — Patient Instructions (Signed)
For shin splints use the sports insoles with metatarsal pads (for the transverse arch collapse). If these help we could do custom orthotics. Continue exercises, physical therapy. Take the ibuprofen as needed for your shoulder. Can consider a steroid injection for your developing frozen shoulder but this makes me nervous with your blood sugar lability. Follow up with me in 2-3 months.

## 2023-01-08 DIAGNOSIS — Z9641 Presence of insulin pump (external) (internal): Secondary | ICD-10-CM | POA: Diagnosis not present

## 2023-01-08 DIAGNOSIS — E1040 Type 1 diabetes mellitus with diabetic neuropathy, unspecified: Secondary | ICD-10-CM | POA: Diagnosis not present

## 2023-01-08 DIAGNOSIS — E1069 Type 1 diabetes mellitus with other specified complication: Secondary | ICD-10-CM | POA: Diagnosis not present

## 2023-01-08 DIAGNOSIS — Z794 Long term (current) use of insulin: Secondary | ICD-10-CM | POA: Diagnosis not present

## 2023-01-08 DIAGNOSIS — E1065 Type 1 diabetes mellitus with hyperglycemia: Secondary | ICD-10-CM | POA: Diagnosis not present

## 2023-01-08 NOTE — Progress Notes (Signed)
PCP: Aura Dials, PA-C  Subjective:   HPI: Patient is a 42 y.o. male here for follow-up.  11/27: Patient returns without many changes to his status. One positive is he is now on the pancreas transplant waiting list. He reports changing sites up to 7 times a day because despite boluses or insulin delivery his blood sugar does not come down - continued difficulty finding sites. Seemed a little better when he was hydrated with IV fluids on recent hospital visit. Continues walking on treadmill and doing exercise videos - pain is unchanged in hands and shoulders, legs.  1/22: Patient overall about the same. Main complaint today is left shoulder. Pain anterior and posterior. Feels mainly in pec major area from where he points. No swelling or bruising. He is on the pancreas [facility]. Has a large ~1cm stone in right parotid - has seen a couple surgeons with recommendation to have the gland removed prior to his transplant - he is thinking about this. Blood sugars have been well controlled though with minimal intake - he is still having issues with sites and reports using his back for sites now.  3/13: [name] continues to work through process of potential transplant. Is on the national list but is working with [facility] through their process and in discussions with a group in [place] that does islet cell only transplantation. He is waiting on recommendations there before going ahead with any type of procedure to remove the parotid gland and stone. Did a visit of physical therapy and has more scheduled going forward. Right shoulder discomfort has been worse recently with some decreased motion, pain with reaching.  Past Medical History:  Diagnosis Date   ADHD (attention deficit hyperactivity disorder)    Anxiety    Asthma    Autonomic neuropathy due to diabetes ([facility])    Chronic headaches    Diabetes ([facility])    Fatigue    GERD (gastroesophageal reflux disease)    Goiter    High  blood pressure    High cholesterol    Hypercholesterolemia    Hypertension    Hypoglycemia associated with diabetes ([facility])    Malnutrition ([facility])    Obesity    Sleep apnea    mild, no cpap   Tachycardia    Type 1 diabetes mellitus not at goal [facility])    Uncontrolled DM with microalbuminuria or microproteinuria     Current Outpatient Medications on File Prior to Visit  Medication Sig Dispense Refill   acetaZOLAMIDE ER (DIAMOX) 500 MG capsule 625 mg. (Patient not taking: Reported on 05/19/2022)     albuterol (VENTOLIN HFA) 108 (90 Base) MCG/ACT inhaler Inhale 2 puffs into the lungs every 4 (four) hours as needed for wheezing or shortness of breath. 18 g 1   atorvastatin (LIPITOR) 20 MG tablet Take 20 mg by mouth daily.   0   BAQSIMI TWO PACK 3 MG/DOSE POWD USE 1 SPRAY INTO THE NOSE AS DIRECTED 2 each 3   Continuous Blood Gluc Receiver (DEXCOM G6 RECEIVER) DEVI 1 Device by Does not apply route as directed. 1 each 2   Continuous Blood Gluc Sensor (DEXCOM G6 SENSOR) MISC INJECT 1 SENSOR UNDER THE SKIN AS DIRECTED( CHANGE SENSOR EVERY 10 DAYS) 3 each 11   Continuous Blood Gluc Sensor (FREESTYLE LIBRE 2 SENSOR) MISC USE EVERY 14 DAYS. USE IF DEXCOM FAILS (Patient not taking: Reported on 05/19/2022) 2 each 5   Continuous Blood Gluc Transmit (DEXCOM G6 TRANSMITTER) MISC USE AS DIRECTED  WITH SENSOR UP TO 8 TIMES 1 each 2   EPINEPHrine (AUVI-Q) 0.3 mg/0.3 mL IJ SOAJ injection Use as directed for life-threatening allergic reaction. (Patient not taking: Reported on 05/19/2022) 2 each 2   fluticasone-salmeterol (WIXELA INHUB) 250-50 MCG/ACT AEPB Inhale 1 puff into the lungs in the morning and at bedtime. 60 each 5   HUMALOG 100 UNIT/ML injection Use 300 units in insulin pump every 48 hours (Patient not taking: Reported on 05/19/2022) 120 mL 1   insulin aspart (NOVOLOG) 100 UNIT/ML injection Use up to 300 units in pump every 48 hours 120 mL 3   Insulin Lispro Junior KwikPen (HUMALOG JR) 100 UNIT/ML KwikPen  ADMINISTER UP TO 50 UNITS UNDER THE SKIN DAILY AS DIRECTED BY PRESCRIBER (Patient not taking: Reported on 05/19/2022) 15 mL 11   Insulin Pen Needle (PEN NEEDLES) 32G X 4 MM MISC Use to inject insulin 6x per day (Patient not taking: Reported on 05/19/2022) 200 each 5   levocetirizine (XYZAL) 5 MG tablet Take 1 tablet (5 mg total) by mouth daily as needed for allergies (Can take an extra dose during flare ups.). (Patient not taking: Reported on 05/19/2022) 60 tablet 5   lipase/protease/amylase (CREON) 36000 UNITS CPEP capsule 3 pills with every meal and 2 pills with every snack 390 capsule 11   losartan (COZAAR) 50 MG tablet TAKE 1 TABLET(50 MG) BY MOUTH DAILY 90 tablet 1   montelukast (SINGULAIR) 10 MG tablet Take 1 tablet (10 mg total) by mouth at bedtime. 30 tablet 5   Multiple Vitamin (MULTIVITAMIN WITH MINERALS) TABS tablet Take 1 tablet by mouth daily. (Patient not taking: Reported on 05/19/2022)     NUCALA 100 MG/ML SOAJ Inject 1 mL (100 mg total) into the skin every 28 (twenty-eight) days. 1 mL 11   omeprazole (PRILOSEC) 20 MG capsule Take 1 capsule (20 mg total) by mouth daily. (Patient not taking: Reported on 05/19/2022) 30 capsule 11   ondansetron (ZOFRAN) 4 MG tablet Take one tablet every 8 hours on injection days. (Patient not taking: Reported on 05/19/2022) 20 tablet 3   ONETOUCH VERIO test strip USE 1 STRIP TO TEST BLOOD SUGAR 8 TIMES DAILY 250 strip 3   topiramate (TOPAMAX) 50 MG tablet Take 1 tablet (50 mg total) by mouth 2 (two) times daily. Please call and make overdue appt for further refills. 2nd attempt 30 tablet 0   Vitamin D, Ergocalciferol, (DRISDOL) 1.25 MG (50000 UNIT) CAPS capsule Take 50,000 Units by mouth every 7 (seven) days.     Current Facility-Administered Medications on File Prior to Visit  Medication Dose Route Frequency Provider Last Rate Last Admin   gadopentetate dimeglumine (MAGNEVIST) injection 20 mL  20 mL Intravenous Once PRN Dohmeier, Asencion Partridge, MD        Past  Surgical History:  Procedure Laterality Date   EYE SURGERY Right 05/30/2020   Vitrectomy, Dr. Zadie Rhine   EYE SURGERY  04/2020   IR 3D INDEPENDENT [facility]  07/16/2020   IR ANGIO INTRA EXTRACRAN SEL COM CAROTID INNOMINATE BILAT MOD SED  07/16/2020   IR ANGIO VERTEBRAL SEL SUBCLAVIAN INNOMINATE UNI L MOD SED  07/16/2020   IR ANGIO VERTEBRAL SEL VERTEBRAL UNI R MOD SED  07/16/2020   IR US GUIDE VASC ACCESS RIGHT  07/16/2020   REFRACTIVE SURGERY     x9   WISDOM TOOTH EXTRACTION      Allergies  Allergen Reactions   Flonase [Fluticasone] Shortness Of Breath   Sulfa Antibiotics     Shortness of  breath, increased heart rate; "happened quite a while ago"   Molds & Smuts Other (See Comments)    Aggravate asthma   Sulfites Itching and Swelling   Tetracyclines & Related Other (See Comments)    increased intracranial pressure   Lactose Nausea Only   Lactose Intolerance (Gi) Diarrhea and Nausea And Vomiting    Upset stomach   [place] Cough    BP 114/70   Ht 6' (1.829 m)   Wt 193 lb (87.5 kg)   BMI 26.18 kg/m      09/11/2020    9:45 AM 06/24/2021   10:44 AM 08/05/2021   10:00 AM 09/16/2021   10:08 AM  [facility] Adult Exercise  Frequency of aerobic exercise (# of days/week) '5 3 3 3  '$ Average time in minutes '30 15 15 15  '$ Frequency of strengthening activities (# of days/week) 0 '1 1 1        '$ No data to display              Objective:  Physical Exam:  Gen: NAD, comfortable in exam room  Right shoulder: Mild generalized atrophy.  No swelling, ecchymoses.  No gross deformity. Mild TTP also diffusely about shoulder. ROM - only 30 degrees ER compared to 80 on left.  Abduction and flexion decreased by 20 degrees. Negative Hawkins, Neers. Strength 5/5 with empty can and resisted internal/external rotation. NV intact distally.  Assessment & Plan:  Type 1 diabetes - working through transplant list process for whole pancreas as well as islet cells.   Right shoulder pain  - consistent with adhesive capsulitis.  Physical therapy, tylenol, nsaids if needed.  Discussed intraarticular steroid injection +/- suprascapular nerve block but concern given his labile blood sugars and would like to avoid this. Shin splints - at end of visit he also asked about shin splints/pain he's had - sports insoles provided with metatarsal pads.

## 2023-01-09 DIAGNOSIS — I10 Essential (primary) hypertension: Secondary | ICD-10-CM | POA: Diagnosis not present

## 2023-01-09 DIAGNOSIS — E785 Hyperlipidemia, unspecified: Secondary | ICD-10-CM | POA: Diagnosis not present

## 2023-01-09 DIAGNOSIS — E109 Type 1 diabetes mellitus without complications: Secondary | ICD-10-CM | POA: Diagnosis not present

## 2023-01-12 ENCOUNTER — Encounter (INDEPENDENT_AMBULATORY_CARE_PROVIDER_SITE_OTHER): Payer: BC Managed Care – PPO | Admitting: Ophthalmology

## 2023-01-12 ENCOUNTER — Ambulatory Visit: Payer: BC Managed Care – PPO | Admitting: Family Medicine

## 2023-01-12 DIAGNOSIS — Z01818 Encounter for other preprocedural examination: Secondary | ICD-10-CM | POA: Diagnosis not present

## 2023-01-12 DIAGNOSIS — K8681 Exocrine pancreatic insufficiency: Secondary | ICD-10-CM | POA: Diagnosis not present

## 2023-01-12 DIAGNOSIS — Z7682 Awaiting organ transplant status: Secondary | ICD-10-CM | POA: Diagnosis not present

## 2023-01-14 DIAGNOSIS — Z9641 Presence of insulin pump (external) (internal): Secondary | ICD-10-CM | POA: Diagnosis not present

## 2023-01-14 DIAGNOSIS — E10649 Type 1 diabetes mellitus with hypoglycemia without coma: Secondary | ICD-10-CM | POA: Diagnosis not present

## 2023-01-14 DIAGNOSIS — Z01818 Encounter for other preprocedural examination: Secondary | ICD-10-CM | POA: Diagnosis not present

## 2023-01-15 ENCOUNTER — Other Ambulatory Visit (HOSPITAL_COMMUNITY): Payer: Self-pay

## 2023-01-16 ENCOUNTER — Ambulatory Visit: Payer: BC Managed Care – PPO | Attending: Family Medicine

## 2023-01-16 DIAGNOSIS — M25512 Pain in left shoulder: Secondary | ICD-10-CM | POA: Diagnosis not present

## 2023-01-16 DIAGNOSIS — G8929 Other chronic pain: Secondary | ICD-10-CM | POA: Insufficient documentation

## 2023-01-16 NOTE — Therapy (Addendum)
OUTPATIENT PHYSICAL THERAPY TREATMENT NOTE/Discharge   Patient Name: [name] MRN: [id number] DOB:[birth year], 42 y.o., male Today's Date: 01/16/2023  PCP: Lucila Maine  REFERRING PROVIDER: Lenda Kelp, MD   END OF SESSION:   PT End of Session - 01/16/23 1251     Visit Number 2    Number of Visits 5    Date for PT Re-Evaluation 02/27/23    Authorization Type [employer] COMM PPO    PT Start Time 1145    PT Stop Time 1230    PT Time Calculation (min) 45 min    Activity Tolerance Patient tolerated treatment well    Behavior During Therapy WFL for tasks assessed/performed             Past Medical History:  Diagnosis Date   ADHD (attention deficit hyperactivity disorder)    Anxiety    Asthma    Autonomic neuropathy due to diabetes (HCC)    Chronic headaches    Diabetes (HCC)    Fatigue    GERD (gastroesophageal reflux disease)    Goiter    High blood pressure    High cholesterol    Hypercholesterolemia    Hypertension    Hypoglycemia associated with diabetes (HCC)    Malnutrition (HCC)    Obesity    Sleep apnea    mild, no cpap   Tachycardia    Type 1 diabetes mellitus not at goal [facility])    Uncontrolled DM with microalbuminuria or microproteinuria    Past Surgical History:  Procedure Laterality Date   EYE SURGERY Right 05/30/2020   Vitrectomy, Dr. Luciana Axe   EYE SURGERY  04/2020   IR 3D INDEPENDENT WKST  07/16/2020   IR ANGIO INTRA EXTRACRAN SEL COM CAROTID INNOMINATE BILAT MOD SED  07/16/2020   IR ANGIO VERTEBRAL SEL SUBCLAVIAN INNOMINATE UNI L MOD SED  07/16/2020   IR ANGIO VERTEBRAL SEL VERTEBRAL UNI R MOD SED  07/16/2020   IR US GUIDE VASC ACCESS RIGHT  07/16/2020   REFRACTIVE SURGERY     x9   WISDOM TOOTH EXTRACTION     Patient Active Problem List   Diagnosis Date Noted   Exocrine pancreatic insufficiency 02/27/2021   Hypoglycemia 01/30/2021   Nuclear sclerotic cataract of both eyes 09/12/2020   Ischemic optic neuritis, right  07/19/2020   Cerebral arteriosclerosis 07/19/2020   Photophobia, left eye 07/19/2020   Optic neuritis, posterior 07/10/2020   Color vision deficiency 07/10/2020   Vitreomacular adhesion of left eye 07/03/2020   H/O visual field defect 06/14/2020   Stable treated proliferative diabetic retinopathy of left eye with macular edema determined by examination associated with type 1 diabetes mellitus (HCC) 05/28/2020   Benign intracranial hypertension 05/28/2020   Stable treated proliferative diabetic retinopathy of right eye with macular edema determined by examination associated with type 1 diabetes mellitus (HCC) 05/28/2020   Vitreous hemorrhage of right eye (HCC) 05/28/2020   Pain in joint, shoulder region 04/17/2020   Finger numbness 04/17/2020   Moderate persistent asthma without complication 12/27/2019   Contusion of back, right, initial encounter 04/12/2019   Left shoulder pain 04/12/2019   Acute pain of left wrist 04/12/2019   Pseudotumor cerebri syndrome 02/16/2019   Class 1 obesity with serious comorbidity and body mass index (BMI) of 32.0 to 32.9 in adult 02/16/2019   Idiopathic normal pressure hydrocephalus (HCC) 12/15/2018   Severe persistent asthma with acute exacerbation 08/31/2018   Seasonal and perennial allergic rhinitis 08/31/2018   Mild intermittent asthma without  complication 07/12/2018   Type 1 diabetes mellitus with other specified complication (HCC) 06/09/2018   Sore throat 04/26/2018   Moderate persistent asthma with acute exacerbation 04/26/2018   Other allergic rhinitis 04/26/2018   Gastroesophageal reflux disease 04/26/2018   Food allergy 04/26/2018   Pseudotumor cerebri 03/25/2018   Morbid obesity with BMI of 40.0-44.9, adult (HCC) 03/25/2018   Altitude sickness, subsequent encounter 03/25/2018   Encounter for medication management 03/25/2018   Snoring 03/25/2018   Increased intracranial pressure 03/20/2018   Low back pain 03/10/2018   Cervicalgia of  occipito-atlanto-axial region 08/31/2017   Type 1 diabetes mellitus with complication (HCC) 08/31/2017   Calcified lymph nodes 06/04/2017   Insulin pump in place 05/28/2017   Neuropathy 05/28/2017   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, with proliferative retinopathy (HCC) 05/28/2017   Other symptoms and signs involving the nervous system 05/28/2017   Nightmares REM-sleep type 05/28/2017   Proliferative diabetic retinopathy, right eye (HCC) 04/11/2017   Benign paroxysmal positional vertigo 03/29/2017   Costochondritis 03/06/2017   Acute upper respiratory infection 03/06/2017   Combined hyperlipidemia 02/11/2016   Metatarsalgia of right foot 02/07/2016   Lactose intolerance 01/07/2016   Inappropriate sinus tachycardia 06/29/2015   Thoracic back pain 11/02/2013   Type 1 diabetes mellitus not at goal [facility])    Goiter    Hypercholesterolemia    ADHD (attention deficit hyperactivity disorder)    Fatigue    Tachycardia    Autonomic neuropathy associated with type 1 diabetes mellitus (HCC)    Hypertension    Uncontrolled DM with microalbuminuria or microproteinuria    Hypoglycemia associated with diabetes (HCC)    DIABETES MELLITUS, I 12/24/2006   ATTENTION DEFICIT, W/HYPERACTIVITY 12/24/2006    REFERRING DIAG: M25.512 (ICD-10-CM) - Left shoulder pain, unspecified chronicity     THERAPY DIAG:  Chronic left shoulder pain  Rationale for Evaluation and Treatment Rehabilitation  ONSET DATE: Chronic   SUBJECTIVE:                                                                                                                                                                                       SUBJECTIVE STATEMENT: Pt reports his R shoulder has been bothering him. He saw Dr. Pearletha Forge and was told he maybe developing diabetic frozen shoulder. It may have occurred with throwing a football  with nieces for approx 30 mins. It bothers him with quick, overhead movements or  reaching back behind him.  PERTINENT HISTORY: Uncontrolled DM with microalbuminuria or microproteinuria, ADHD, anxiety, Autonomic neuropathy due to diabetes, HTN    PAIN:  Are you having pain? Yes: NPRS scale: 1/10 Pain location: L pectoral to  upper back Pain description: ache Aggravating factors: straining L UE  Relieving factors: Rest R shoulder: 1/10 at rest, 10/10    PRECAUTIONS: None   WEIGHT BEARING RESTRICTIONS: No   FALLS:  Has patient fallen in last 6 months? No   LIVING ENVIRONMENT: Lives with: lives with their family Lives in: House/apartment No issue with accessing or mobility within home   OCCUPATION: Manufacturing engineer   PLOF: Independent   PATIENT GOALS:maintain strength and reduce pain   NEXT MD VISIT:    OBJECTIVE: (objective measures completed at initial evaluation unless otherwise dated)    DIAGNOSTIC FINDINGS:  NA   PATIENT SURVEYS:             FOTO: Perceived function  61%, predicted 75%    COGNITION: Overall cognitive status: Within functional limits for tasks assessed                                  SENSATION: WFL   POSTURE: Increased thoracic kyphosis, rounded shoulders, winging scapula L>R   UPPER EXTREMITY ROM:    Active ROM Right eval Left eval Rt Lt  Shoulder flexion decreased decreased P140 P150  Shoulder extension        Shoulder abduction decreased decreased P135 P145  Shoulder adduction        Shoulder internal rotation     P70 P90  Shoulder external rotation decreased decreased P40 P80  Elbow flexion        Elbow extension        Wrist flexion        Wrist extension        Wrist ulnar deviation        Wrist radial deviation        Wrist pronation        Wrist supination        (Blank rows = not tested)   UPPER EXTREMITY MMT:   MMT Right eval Left eval  Shoulder flexion 4 4-  Shoulder extension      Shoulder abduction 4 4-  Shoulder adduction      Shoulder internal rotation 5 5  Shoulder external  rotation 4 4-  Middle trapezius      Lower trapezius      Elbow flexion      Elbow extension      Wrist flexion      Wrist extension      Wrist ulnar deviation      Wrist radial deviation      Wrist pronation      Wrist supination      Grip strength (lbs)      (Blank rows = not tested)   SHOULDER SPECIAL TESTS: Impingement tests: Hawkins/Kennedy impingement test: negative Rotator cuff assessment: Empty can test: negative and Full can test: negative   PALPATION:  No overt area of tenderness             TODAY'S TREATMENT:  [facility] Adult PT Treatment:                                                DATE: 01/16/23 Therapeutic Exercise: Doorway L shoulder ER stretch x3 20" Standing shoulder IR YTB 3x10  Shoulder row GTB 2x10 Shoulder ext GTB 2x10 Shoulder ER bilat GTB 2x10 Shoulder hor abd  star pattern 2x10 Updated HEP                                                                                                                                        [facility] Adult PT Treatment:                                                DATE: 12/25/22 Therapeutic Exercise: Developed, instructed in, and pt completed therex as noted in HEP    PATIENT EDUCATION: Education details: Eval findings, POC, HEP  Person educated: Patient Education method: Explanation, Demonstration, Tactile cues, Verbal cues, and Handouts Education comprehension: verbalized understanding, returned demonstration, verbal cues required, and tactile cues required   HOME EXERCISE PROGRAM: Access Code: [email or web address] URL: [email or web address] Date: 01/16/2023 Prepared by: Joellyn Rued  Exercises - Standing Shoulder Row with Anchored Resistance  - 1 x daily - 7 x weekly - 2 sets - 10 reps - Shoulder Extension with Resistance  - 1 x daily - 7 x weekly - 2 sets - 10 reps - Shoulder External Rotation and Scapular Retraction with Resistance  - 1 x daily - 7 x weekly - 2 sets - 10 reps - Standing Shoulder Horizontal  Abduction with Resistance  - 1 x daily - 7 x weekly - 2 sets - 10 reps - Shoulder Internal Rotation with Resistance  - 1 x daily - 7 x weekly - 2 sets - 10 reps - Doorway Pec Stretch at 90 Degrees Abduction  - 1 x daily - 7 x weekly - 1 sets - 3 reps - 30 hold - Standing Shoulder External Rotation Stretch in Doorway (Mirrored)  - 1 x daily - 7 x weekly - 1 sets - 3 reps - 20 hold   ASSESSMENT:   CLINICAL IMPRESSION: Pt presents with R shoulder pain after throwing a football for approx 30 mins. Assessment revealed pain c resisted IR and pain c passive ER which was significantly limited in ROM. Pt signs and symptoms seem consistent with a R shoulder IR strain. Therex was developed to address strength and flexibility of the R shoulder IR. Pt then completed therex to address his initial issue with upper body strength. Pt tolerated PT today without adverse effects. Pt will continue to benefit from skilled PT to address impairments for improved function.     OBJECTIVE IMPAIRMENTS: decreased activity tolerance, decreased ROM, decreased strength, impaired UE functional use, postural dysfunction, obesity, and pain.    ACTIVITY LIMITATIONS: carrying, lifting, and transfers   PARTICIPATION LIMITATIONS: meal prep, cleaning, laundry, and yard work   PERSONAL FACTORS: Fitness, Past/current experiences, Time since onset of injury/illness/exacerbation, and 3+ comorbidities: Uncontrolled DM with microalbuminuria or microproteinuria, ADHD, anxiety, Autonomic neuropathy due to diabetes, HTN   are also affecting patient's functional  outcome.    REHAB POTENTIAL: Good   CLINICAL DECISION MAKING: Evolving/moderate complexity   EVALUATION COMPLEXITY: Moderate     GOALS:   SHORT TERM GOALS: Target date: 01/16/23   Pt will be Ind in an initial HEP  Baseline: initiated Goal status: Ongoing   LONG TERM GOALS: Target date: 02/27/23   Pt will be Ind in a final HEP to maintain achieved LOF Baseline:  initiated Goal status: INITIAL   2.  Increase L and R shoulder strength by 1/2 muscle grade for improved function with less pain Baseline: see flow sheet Goal status: INITIAL   3.  Pt will report a decrease in L shoulder pain to 2/10 or less for improved function and QOL Baseline: 5/10 Goal status: INITIAL   4.  Pt's FOTO score will improved to the predicted value of 75% as indication of improved function  Baseline:61% Goal status: INITIAL   PLAN:   PT FREQUENCY:  1 visit/ 2 weeks   PT DURATION: 8 weeks   PLANNED INTERVENTIONS: Therapeutic exercises, Therapeutic activity, Patient/Family education, Self Care, Joint mobilization, Aquatic Therapy, Dry Needling, Electrical stimulation, Cryotherapy, Moist heat, Taping, Ultrasound, Ionotophoresis 4mg /ml Dexamethasone, Manual therapy, and Re-evaluation   PLAN FOR NEXT SESSION: Review FOTO; assess response to HEP; progress therex as indicated; use of modalities, manual therapy; and TPDN as indicated.   Mali Eppard MS, PT 01/16/23 1:05 PM   Joellyn Rued MS, PT 01/16/23 4:11 PM  PHYSICAL THERAPY DISCHARGE SUMMARY  Visits from Start of Care: 2  Current functional level related to goals / functional outcomes: unknown   Remaining deficits: unknown   Education / Equipment: HEP and Pt ED   Patient agrees to discharge. Patient goals were not met. Patient is being discharged due to not returning since the last visit.  Ivionna Verley MS, PT 04/08/23 9:38 AM

## 2023-01-21 ENCOUNTER — Other Ambulatory Visit (HOSPITAL_COMMUNITY): Payer: Self-pay

## 2023-01-21 ENCOUNTER — Other Ambulatory Visit: Payer: Self-pay

## 2023-01-21 DIAGNOSIS — F411 Generalized anxiety disorder: Secondary | ICD-10-CM | POA: Diagnosis not present

## 2023-01-24 DIAGNOSIS — S42291A Other displaced fracture of upper end of right humerus, initial encounter for closed fracture: Secondary | ICD-10-CM | POA: Diagnosis not present

## 2023-01-24 DIAGNOSIS — M25531 Pain in right wrist: Secondary | ICD-10-CM | POA: Diagnosis not present

## 2023-01-26 ENCOUNTER — Other Ambulatory Visit: Payer: Self-pay | Admitting: Orthopedic Surgery

## 2023-01-26 ENCOUNTER — Ambulatory Visit
Admission: RE | Admit: 2023-01-26 | Discharge: 2023-01-26 | Disposition: A | Discharge status: Home / Self Care | Payer: BC Managed Care – PPO | Source: Ambulatory Visit | Attending: Orthopedic Surgery | Admitting: Orthopedic Surgery

## 2023-01-26 DIAGNOSIS — S42211A Unspecified displaced fracture of surgical neck of right humerus, initial encounter for closed fracture: Secondary | ICD-10-CM | POA: Diagnosis not present

## 2023-01-26 DIAGNOSIS — S42251A Displaced fracture of greater tuberosity of right humerus, initial encounter for closed fracture: Secondary | ICD-10-CM | POA: Diagnosis not present

## 2023-01-26 DIAGNOSIS — M25511 Pain in right shoulder: Secondary | ICD-10-CM | POA: Diagnosis not present

## 2023-01-26 DIAGNOSIS — M25531 Pain in right wrist: Secondary | ICD-10-CM | POA: Diagnosis not present

## 2023-01-26 DIAGNOSIS — Z1330 Encounter for screening examination for mental health and behavioral disorders, unspecified: Secondary | ICD-10-CM | POA: Diagnosis not present

## 2023-01-26 DIAGNOSIS — E109 Type 1 diabetes mellitus without complications: Secondary | ICD-10-CM | POA: Diagnosis not present

## 2023-01-26 DIAGNOSIS — S42294A Other nondisplaced fracture of upper end of right humerus, initial encounter for closed fracture: Secondary | ICD-10-CM | POA: Diagnosis not present

## 2023-01-26 DIAGNOSIS — J4520 Mild intermittent asthma, uncomplicated: Secondary | ICD-10-CM | POA: Diagnosis not present

## 2023-01-27 ENCOUNTER — Ambulatory Visit: Payer: BC Managed Care – PPO

## 2023-01-28 DIAGNOSIS — F411 Generalized anxiety disorder: Secondary | ICD-10-CM | POA: Diagnosis not present

## 2023-02-01 ENCOUNTER — Telehealth: Payer: BC Managed Care – PPO | Admitting: Family Medicine

## 2023-02-01 DIAGNOSIS — S42291D Other displaced fracture of upper end of right humerus, subsequent encounter for fracture with routine healing: Secondary | ICD-10-CM | POA: Diagnosis not present

## 2023-02-01 DIAGNOSIS — S42291S Other displaced fracture of upper end of right humerus, sequela: Secondary | ICD-10-CM

## 2023-02-01 NOTE — Patient Instructions (Signed)
Humerus Fracture Rehab Ask your health care provider which exercises are safe for you. Do exercises exactly as told by your health care provider and adjust them as directed. It is normal to feel mild stretching, pulling, tightness, or discomfort as you do these exercises. Stop right away if you feel sudden pain or your pain gets worse. Do not begin these exercises until told by your health care provider. Stretching and range-of-motion exercises These exercises warm up your muscles and joints and improve the movement and flexibility of your arm and shoulder. These exercises can also help to relieve pain and stiffness. Shoulder pendulum  Stand near a table or counter that you can hold onto for balance. Bend forward at the waist and let your left / right arm hang straight down. Use your other arm to support you and help you stay balanced by holding on to the table or counter. Relax your left / right arm and shoulder muscles, and move your hips and your trunk so your left / right arm swings freely. Your arm should swing because of the motion of your body, not because you are using your arm or shoulder muscles. Keep moving your hips and trunk so your arm swings in the following directions, as told by your health care provider: Side to side. Forward and backward. In clockwise and counterclockwise circles. Slowly return to the starting position. Repeat __________ times. Complete this exercise __________ times a day. Wall crawl, shoulder flexion  Stand facing a wall. Put your left / right hand on the wall. Slide or crawl your left / right hand up the wall. Stop when you feel a stretch in your shoulder (flexion), or when you reach the angle that is recommended by your health care provider. Use your other hand to help raise your arm, if needed. As your hand gets higher, you may need to step closer to the wall. Avoid raising your shoulder as you raise your arm. Try to keep your shoulder blade tucked down  toward your spine. When you get your hand as high as you can, hold for __________ seconds. Slowly return to the starting position. Use your other arm to help, if needed. Repeat __________ times. Complete this exercise __________ times a day. Standing shoulder abduction, passive In this exercise, the injured shoulder relaxes (passive) while you use the healthy arm to push it away from your body (abduction). Stand and hold a broomstick, cane, or similar object with both hands. Place your hands a little more than shoulder-width apart. One hand should be palm-up, and your other hand should be palm-down. While keeping your elbow straight and your shoulder muscles relaxed, push the stick across your body toward your left / right side. Raise your left / right arm to the side of your body and then over your head until you feel a stretch in your shoulder. Stop when you reach the angle that is recommended by your health care provider. Avoid raising your shoulder while you raise your arm. Keep your shoulder blade tucked down toward the middle of your spine. Hold for __________ seconds. Slowly return to the starting position. Repeat __________ times. Complete this exercise __________ times a day. External rotation, passive  Sit in a stable chair without armrests, or stand up. Tuck a soft object, such as a folded towel or a small ball, under your left / right upper arm. Hold a broomstick, cane, or similar object with your palms face-down, toward the floor. Bend your elbows to a 90-degree angle (  right angle), and keep your hands about shoulder-width apart. With your uninjured arm, push the stick across your body. Keep your left / right arm bent. This will rotate your left / right forearm away from your body (external rotation). Hold for __________ seconds. Slowly return to the starting position. Repeat __________ times. Complete this exercise __________ times a day. Elbow extension Your health care provider  may have you do this exercise if your elbow is stiff. Stand or sit with your left / right elbow bent to a 90-degree angle (right angle). Position your forearm so that the thumb is facing the ceiling (neutral position). Straighten your left / right elbow as far as you can using only your arm muscles (extension). If your elbow is not fully straight, you can try to straighten your elbow farther by gently pushing down on your forearm with your other hand. Do this until you feel a gentle stretch on the inside of your left / right elbow. Hold for __________ seconds. Slowly return to the starting position. Repeat __________ times. Complete this exercise __________ times a day. Elbow flexion Your health care provider may have you do this exercise if your elbow is stiff. Stand or sit with your left / right elbow bent to a 90-degree angle (right angle). Position your forearm so that the thumb is facing the ceiling (neutral position). Bend your left / right elbow as far as you can using only your arm muscles (flexion). Aim to touch your left / right thumb to the same-side shoulder. If your elbow is not bending fully, you can try to bend your elbow farther by gently pushing on your wrist with your other hand to get your hand as close as possible to your same-side shoulder. Do this until you feel a gentle stretch on the back of your left / right elbow. Hold for __________ seconds. Slowly return to the starting position. Repeat __________ times. Complete this exercise __________ times a day. Strengthening exercises These exercises build strength and endurance in your arm and shoulder. Endurance is the ability to use your muscles for a long time, even after your muscles get tired. Do not start these exercises until told to do so by your health care provider. Internal rotation, isometric This is an exercise in which you press your palm against a door frame without moving your shoulder joint (isometric). Stand or  sit in a doorway, facing the door frame. Bend your left / right elbow, and place the palm of your hand against the door frame. Only your palm should be touching the frame. Keep your upper arm at your side. Gently press your hand against the door frame as if you are trying to push your arm toward your abdomen (internal rotation). Gradually increase the pressure until you are pressing as hard as you can comfortably tolerate. Stop increasing the pressure if you feel shoulder pain. Avoid raising your shoulder while you press your hand into the door frame. Keep your shoulder blade tucked down toward the middle of your back. Hold for __________ seconds. Slowly release the tension, and relax your muscles completely before you repeat the exercise. Repeat __________ times. Complete this exercise __________ times a day. External rotation, isometric This is an exercise in which you press the back of your wrist against a door frame without moving your shoulder joint (isometric). Stand or sit in a doorway, facing the door frame. Bend your left / right elbow and place the back of your wrist against the door frame.   Only the back of your wrist should be touching the frame. Keep your upper arm at your side. Gently press your wrist against the door frame, as if you are trying to push your arm away from your abdomen (external rotation). Gradually increase the pressure until you are pressing as hard as you can comfortably tolerate. Stop increasing the pressure if you feel pain. Avoid raising your shoulder while you press your wrist into the door frame. Keep your shoulder blade tucked down toward the middle of your back. Hold for __________ seconds. Slowly release the tension, and relax your muscles completely before you repeat the exercise. Repeat __________ times. Complete this exercise __________ times a day. Shoulder flexion, isometric Stand or sit in a doorway, facing the door frame. Keep your left / right arm  straight and make a gentle fist with your hand. Place your fist against the door frame. Only your fist should be touching the frame. Keep your upper arm at your side. Gently press your fist against the door frame, as if you are trying to raise your arm above your head (isometric shoulder flexion). Avoid raising your shoulder while you press your hand into the door frame. Keep your shoulder blade tucked down toward the middle of your back. Hold for __________ seconds. Slowly release the tension, and relax your muscles completely before you repeat the exercise. Repeat __________ times. Complete this exercise __________ times a day. Shoulder abduction, isometric  Stand or sit in a doorway. Your left / right arm should be closest to the door frame. Keep your left / right arm straight, and place the back of your hand against the door frame. Only your hand should be touching the frame. Keep the rest of your arm close to your side. Gently press the back of your hand against the door frame, as if you are trying to raise your arm out to the side (isometric shoulder abduction). Avoid raising your shoulder while you press your hand into the door frame. Keep your shoulder blade tucked down toward the middle of your back. Hold for __________ seconds. Slowly release the tension, and relax your muscles completely before you repeat the exercise. Repeat __________ times. Complete this exercise __________ times a day. Shoulder extension, isometric Stand or sit with your back to a wall. Keep your left / right arm straight and make a gentle fist with your hand. Place your fist against the wall. Only your fist should be touching the wall. Gently press your fist against the wall, as if you are trying to bring your arm straight out behind you (isometric extension). Avoid raising your shoulder while you press your hand into the wall. Keep your shoulder blade tucked down toward the middle of your back. Hold for  __________ seconds. Slowly release the tension, and relax your muscles completely before you repeat the exercise. Repeat __________ times. Complete this exercise __________ times a day. Grip strengthening  Hold one of these items in your left / right hand: a dense sponge, a stress ball, or a large, rolled sock. Slowly squeeze the object as hard as you can comfortably tolerate without increasing any pain. Hold your squeeze for __________ seconds. Slowly release your grip. Repeat __________ times. Complete this exercise __________ times a day. This information is not intended to replace advice given to you by your health care provider. Make sure you discuss any questions you have with your health care provider. Document Revised: 12/10/2020 Document Reviewed: 12/10/2020 [employer] Patient Education  2023 [employer]  

## 2023-02-01 NOTE — Progress Notes (Signed)
Virtual Visit Consent   [name], you are scheduled for a virtual visit with a [facility] provider today. Just as with appointments in the office, your consent must be obtained to participate. Your consent will be active for this visit and any virtual visit you may have with one of our providers in the next 365 days. If you have a MyChart account, a copy of this consent can be sent to you electronically.  As this is a virtual visit, video technology does not allow for your provider to perform a traditional examination. This may limit your provider's ability to fully assess your condition. If your provider identifies any concerns that need to be evaluated in person or the need to arrange testing (such as labs, EKG, etc.), we will make arrangements to do so. Although advances in technology are sophisticated, we cannot ensure that it will always work on either your end or our end. If the connection with a video visit is poor, the visit may have to be switched to a telephone visit. With either a video or telephone visit, we are not always able to ensure that we have a secure connection.  By engaging in this virtual visit, you consent to the provision of healthcare and authorize for your insurance to be billed (if applicable) for the services provided during this visit. Depending on your insurance coverage, you may receive a charge related to this service.  I need to obtain your verbal consent now. Are you willing to proceed with your visit today? [name] has provided verbal consent on 02/01/2023 for a virtual visit (video or telephone). Georgana Curio, FNP  Date: 02/01/2023 12:28 PM  Virtual Visit via Video Note   I, Georgana Curio, connected with  Danarius Merryweather  ([id number], 09/15/1981) on 02/01/23 at 12:15 PM EDT by a video-enabled telemedicine application and verified that I am speaking with the correct person using two identifiers.  Location: Patient: Virtual Visit Location  Patient: Home Provider: Virtual Visit Location Provider: Home Office   I discussed the limitations of evaluation and management by telemedicine and the availability of in person appointments. The patient expressed understanding and agreed to proceed.    History of Present Illness: [name] is a 42 y.o. who identifies as a male who was assigned male at birth, and is being seen today for bruise in antecubital space of rt arm following fracture of humerus last week after falling into a storm drain. He has bruising of upper arm. No worsening pain. Good circulation in rt arm. [name]  HPI: HPI  Problems:  Patient Active Problem List   Diagnosis Date Noted   Exocrine pancreatic insufficiency 02/27/2021   Hypoglycemia 01/30/2021   Nuclear sclerotic cataract of both eyes 09/12/2020   Ischemic optic neuritis, right 07/19/2020   Cerebral arteriosclerosis 07/19/2020   Photophobia, left eye 07/19/2020   Optic neuritis, posterior 07/10/2020   Color vision deficiency 07/10/2020   Vitreomacular adhesion of left eye 07/03/2020   H/O visual field defect 06/14/2020   Stable treated proliferative diabetic retinopathy of left eye with macular edema determined by examination associated with type 1 diabetes mellitus 05/28/2020   Benign intracranial hypertension 05/28/2020   Stable treated proliferative diabetic retinopathy of right eye with macular edema determined by examination associated with type 1 diabetes mellitus 05/28/2020   Vitreous hemorrhage of right eye 05/28/2020   Pain in joint, shoulder region 04/17/2020   Finger numbness 04/17/2020   Moderate persistent asthma without complication 12/27/2019  Contusion of back, right, initial encounter 04/12/2019   Left shoulder pain 04/12/2019   Acute pain of left wrist 04/12/2019   Pseudotumor cerebri syndrome 02/16/2019   Class 1 obesity with serious comorbidity and body mass index (BMI) of 32.0 to 32.9 in adult 02/16/2019   Idiopathic normal  pressure hydrocephalus 12/15/2018   Severe persistent asthma with acute exacerbation 08/31/2018   Seasonal and perennial allergic rhinitis 08/31/2018   Mild intermittent asthma without complication 07/12/2018   Type 1 diabetes mellitus with other specified complication 06/09/2018   Sore throat 04/26/2018   Moderate persistent asthma with acute exacerbation 04/26/2018   Other allergic rhinitis 04/26/2018   Gastroesophageal reflux disease 04/26/2018   Food allergy 04/26/2018   Pseudotumor cerebri 03/25/2018   Morbid obesity with BMI of 40.0-44.9, adult 03/25/2018   Altitude sickness, subsequent encounter 03/25/2018   Encounter for medication management 03/25/2018   Snoring 03/25/2018   Increased intracranial pressure 03/20/2018   Low back pain 03/10/2018   Cervicalgia of occipito-atlanto-axial region 08/31/2017   Type 1 diabetes mellitus with complication 08/31/2017   Calcified lymph nodes 06/04/2017   Insulin pump in place 05/28/2017   Neuropathy 05/28/2017   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, with proliferative retinopathy 05/28/2017   Other symptoms and signs involving the nervous system 05/28/2017   Nightmares REM-sleep type 05/28/2017   Proliferative diabetic retinopathy, right eye 04/11/2017   Benign paroxysmal positional vertigo 03/29/2017   Costochondritis 03/06/2017   Acute upper respiratory infection 03/06/2017   Combined hyperlipidemia 02/11/2016   Metatarsalgia of right foot 02/07/2016   Lactose intolerance 01/07/2016   Inappropriate sinus tachycardia 06/29/2015   Thoracic back pain 11/02/2013   Type 1 diabetes mellitus not at goal    Goiter    Hypercholesterolemia    ADHD (attention deficit hyperactivity disorder)    Fatigue    Tachycardia    Autonomic neuropathy associated with type 1 diabetes mellitus    Hypertension    Uncontrolled DM with microalbuminuria or microproteinuria    Hypoglycemia associated with diabetes    DIABETES MELLITUS, I  12/24/2006   ATTENTION DEFICIT, W/HYPERACTIVITY 12/24/2006    Allergies:  Allergies  Allergen Reactions   Flonase [Fluticasone] Shortness Of Breath   Sulfa Antibiotics     Shortness of breath, increased heart rate; "happened quite a while ago"   Molds & Smuts Other (See Comments)    Aggravate asthma   Sulfites Itching and Swelling   Tetracyclines & Related Other (See Comments)    increased intracranial pressure   Lactose Nausea Only   Lactose Intolerance (Gi) Diarrhea and Nausea And Vomiting    Upset stomach   White Birch Cough   Medications:  Current Outpatient Medications:    acetaZOLAMIDE ER (DIAMOX) 500 MG capsule, 625 mg. (Patient not taking: Reported on 05/19/2022), Disp: , Rfl:    albuterol (VENTOLIN HFA) 108 (90 Base) MCG/ACT inhaler, Inhale 2 puffs into the lungs every 4 (four) hours as needed for wheezing or shortness of breath., Disp: 18 g, Rfl: 1   atorvastatin (LIPITOR) 20 MG tablet, Take 20 mg by mouth daily. , Disp: , Rfl: 0   BAQSIMI TWO PACK 3 MG/DOSE POWD, USE 1 SPRAY INTO THE NOSE AS DIRECTED, Disp: 2 each, Rfl: 3   Continuous Blood Gluc Receiver (DEXCOM G6 RECEIVER) DEVI, 1 Device by Does not apply route as directed., Disp: 1 each, Rfl: 2   Continuous Blood Gluc Sensor (DEXCOM G6 SENSOR) MISC, INJECT 1 SENSOR UNDER THE SKIN AS DIRECTED( CHANGE SENSOR  EVERY 10 DAYS), Disp: 3 each, Rfl: 11   Continuous Blood Gluc Sensor (FREESTYLE LIBRE 2 SENSOR) MISC, USE EVERY 14 DAYS. USE IF DEXCOM FAILS (Patient not taking: Reported on 05/19/2022), Disp: 2 each, Rfl: 5   Continuous Blood Gluc Transmit (DEXCOM G6 TRANSMITTER) MISC, USE AS DIRECTED WITH SENSOR UP TO 8 TIMES, Disp: 1 each, Rfl: 2   EPINEPHrine (AUVI-Q) 0.3 mg/0.3 mL IJ SOAJ injection, Use as directed for life-threatening allergic reaction. (Patient not taking: Reported on 05/19/2022), Disp: 2 each, Rfl: 2   fluticasone-salmeterol (WIXELA INHUB) 250-50 MCG/ACT AEPB, Inhale 1 puff into the lungs in the morning and at  bedtime., Disp: 60 each, Rfl: 5   HUMALOG 100 UNIT/ML injection, Use 300 units in insulin pump every 48 hours (Patient not taking: Reported on 05/19/2022), Disp: 120 mL, Rfl: 1   insulin aspart (NOVOLOG) 100 UNIT/ML injection, Use up to 300 units in pump every 48 hours, Disp: 120 mL, Rfl: 3   Insulin Lispro Junior KwikPen (HUMALOG JR) 100 UNIT/ML KwikPen, ADMINISTER UP TO 50 UNITS UNDER THE SKIN DAILY AS DIRECTED BY PRESCRIBER (Patient not taking: Reported on 05/19/2022), Disp: 15 mL, Rfl: 11   Insulin Pen Needle (PEN NEEDLES) 32G X 4 MM MISC, Use to inject insulin 6x per day (Patient not taking: Reported on 05/19/2022), Disp: 200 each, Rfl: 5   levocetirizine (XYZAL) 5 MG tablet, Take 1 tablet (5 mg total) by mouth daily as needed for allergies (Can take an extra dose during flare ups.). (Patient not taking: Reported on 05/19/2022), Disp: 60 tablet, Rfl: 5   lipase/protease/amylase (CREON) 36000 UNITS CPEP capsule, 3 pills with every meal and 2 pills with every snack, Disp: 390 capsule, Rfl: 11   losartan (COZAAR) 50 MG tablet, TAKE 1 TABLET(50 MG) BY MOUTH DAILY, Disp: 90 tablet, Rfl: 1   montelukast (SINGULAIR) 10 MG tablet, Take 1 tablet (10 mg total) by mouth at bedtime., Disp: 30 tablet, Rfl: 5   Multiple Vitamin (MULTIVITAMIN WITH MINERALS) TABS tablet, Take 1 tablet by mouth daily. (Patient not taking: Reported on 05/19/2022), Disp: , Rfl:    NUCALA 100 MG/ML SOAJ, Inject 1 mL (100 mg total) into the skin every 28 (twenty-eight) days., Disp: 1 mL, Rfl: 11   omeprazole (PRILOSEC) 20 MG capsule, Take 1 capsule (20 mg total) by mouth daily. (Patient not taking: Reported on 05/19/2022), Disp: 30 capsule, Rfl: 11   ondansetron (ZOFRAN) 4 MG tablet, Take one tablet every 8 hours on injection days. (Patient not taking: Reported on 05/19/2022), Disp: 20 tablet, Rfl: 3   ONETOUCH VERIO test strip, USE 1 STRIP TO TEST BLOOD SUGAR 8 TIMES DAILY, Disp: 250 strip, Rfl: 3   topiramate (TOPAMAX) 50 MG tablet, Take 1  tablet (50 mg total) by mouth 2 (two) times daily. Please call and make overdue appt for further refills. 2nd attempt, Disp: 30 tablet, Rfl: 0   Vitamin D, Ergocalciferol, (DRISDOL) 1.25 MG (50000 UNIT) CAPS capsule, Take 50,000 Units by mouth every 7 (seven) days., Disp: , Rfl:  No current facility-administered medications for this visit.  Facility-Administered Medications Ordered in Other Visits:    gadopentetate dimeglumine (MAGNEVIST) injection 20 mL, 20 mL, Intravenous, Once PRN, Dohmeier, Porfirio Mylar, MD  Observations/Objective: Patient is well-developed, well-nourished in no acute distress.  Resting comfortably  at home.  Head is normocephalic, atraumatic.  No labored breathing.  Speech is clear and coherent with logical content.  Patient is alert and oriented at baseline.  Bruising noted rt upper arm and elbow  Assessment and Plan: 1. Closed fracture of anatomical neck of right humerus, sequela  Continue use of sling, follow up with ortho tomorrow. Bruise appears to be coming from bruising on upper arm.   Follow Up Instructions: I discussed the assessment and treatment plan with the patient. The patient was provided an opportunity to ask questions and all were answered. The patient agreed with the plan and demonstrated an understanding of the instructions.  A copy of instructions were sent to the patient via MyChart unless otherwise noted below.     The patient was advised to call back or seek an in-person evaluation if the symptoms worsen or if the condition fails to improve as anticipated.  Time:  I spent 10 minutes with the patient via telehealth technology discussing the above problems/concerns.    Georgana Curio, FNP

## 2023-02-04 DIAGNOSIS — F411 Generalized anxiety disorder: Secondary | ICD-10-CM | POA: Diagnosis not present

## 2023-02-05 DIAGNOSIS — L304 Erythema intertrigo: Secondary | ICD-10-CM | POA: Diagnosis not present

## 2023-02-05 DIAGNOSIS — L219 Seborrheic dermatitis, unspecified: Secondary | ICD-10-CM | POA: Diagnosis not present

## 2023-02-09 DIAGNOSIS — M25511 Pain in right shoulder: Secondary | ICD-10-CM | POA: Diagnosis not present

## 2023-02-11 DIAGNOSIS — F411 Generalized anxiety disorder: Secondary | ICD-10-CM | POA: Diagnosis not present

## 2023-02-12 ENCOUNTER — Other Ambulatory Visit: Payer: Self-pay

## 2023-02-18 ENCOUNTER — Ambulatory Visit: Payer: BC Managed Care – PPO | Admitting: Family Medicine

## 2023-02-18 VITALS — BP 130/82 | Ht 72.0 in | Wt 193.0 lb

## 2023-02-18 DIAGNOSIS — M25511 Pain in right shoulder: Secondary | ICD-10-CM | POA: Diagnosis not present

## 2023-02-19 ENCOUNTER — Encounter: Payer: Self-pay | Admitting: Family Medicine

## 2023-02-19 NOTE — Progress Notes (Signed)
PCP: [name], PA-C  Subjective:   HPI: Patient is a 42 y.o. male here for right shoulder injury.  Patient reports on 3/30 he stepped on a broken storm drain while walking which gave way causing him to fall down, catching himself with his right arm. Has been seeing Dr. Ave Filter for this - comminuted but nondisplaced proximal humerus fracture. CT scan performed and is being treated conservatively. He is wearing his sling regularly. Icing, taking tylenol if needed - not requiring anything further for pain. Swelling and bruising has been notable down arm but no skin breakdown.  Past Medical History:  Diagnosis Date   ADHD (attention deficit hyperactivity disorder)    Anxiety    Asthma    Autonomic neuropathy due to diabetes    Chronic headaches    Diabetes    Fatigue    GERD (gastroesophageal reflux disease)    Goiter    High blood pressure    High cholesterol    Hypercholesterolemia    Hypertension    Hypoglycemia associated with diabetes    Malnutrition    Obesity    Sleep apnea    mild, no cpap   Tachycardia    Type 1 diabetes mellitus not at goal    Uncontrolled DM with microalbuminuria or microproteinuria     Current Outpatient Medications on File Prior to Visit  Medication Sig Dispense Refill   acetaZOLAMIDE ER (DIAMOX) 500 MG capsule 625 mg. (Patient not taking: Reported on 05/19/2022)     albuterol (VENTOLIN HFA) 108 (90 Base) MCG/ACT inhaler Inhale 2 puffs into the lungs every 4 (four) hours as needed for wheezing or shortness of breath. 18 g 1   atorvastatin (LIPITOR) 20 MG tablet Take 20 mg by mouth daily.   0   BAQSIMI TWO PACK 3 MG/DOSE POWD USE 1 SPRAY INTO THE NOSE AS DIRECTED 2 each 3   Continuous Blood Gluc Receiver (DEXCOM G6 RECEIVER) DEVI 1 Device by Does not apply route as directed. 1 each 2   Continuous Blood Gluc Sensor (DEXCOM G6 SENSOR) MISC INJECT 1 SENSOR UNDER THE SKIN AS DIRECTED( CHANGE SENSOR EVERY 10 DAYS) 3 each 11   Continuous Blood  Gluc Sensor (FREESTYLE LIBRE 2 SENSOR) MISC USE EVERY 14 DAYS. USE IF DEXCOM FAILS (Patient not taking: Reported on 05/19/2022) 2 each 5   Continuous Blood Gluc Transmit (DEXCOM G6 TRANSMITTER) MISC USE AS DIRECTED WITH SENSOR UP TO 8 TIMES 1 each 2   EPINEPHrine (AUVI-Q) 0.3 mg/0.3 mL IJ SOAJ injection Use as directed for life-threatening allergic reaction. (Patient not taking: Reported on 05/19/2022) 2 each 2   fluticasone-salmeterol (WIXELA INHUB) 250-50 MCG/ACT AEPB Inhale 1 puff into the lungs in the morning and at bedtime. 60 each 5   HUMALOG 100 UNIT/ML injection Use 300 units in insulin pump every 48 hours (Patient not taking: Reported on 05/19/2022) 120 mL 1   insulin aspart (NOVOLOG) 100 UNIT/ML injection Use up to 300 units in pump every 48 hours 120 mL 3   Insulin Lispro Junior KwikPen (HUMALOG JR) 100 UNIT/ML KwikPen ADMINISTER UP TO 50 UNITS UNDER THE SKIN DAILY AS DIRECTED BY PRESCRIBER (Patient not taking: Reported on 05/19/2022) 15 mL 11   Insulin Pen Needle (PEN NEEDLES) 32G X 4 MM MISC Use to inject insulin 6x per day (Patient not taking: Reported on 05/19/2022) 200 each 5   levocetirizine (XYZAL) 5 MG tablet Take 1 tablet (5 mg total) by mouth daily as needed for allergies (Can take an  extra dose during flare ups.). (Patient not taking: Reported on 05/19/2022) 60 tablet 5   lipase/protease/amylase (CREON) 36000 UNITS CPEP capsule 3 pills with every meal and 2 pills with every snack 390 capsule 11   losartan (COZAAR) 50 MG tablet TAKE 1 TABLET(50 MG) BY MOUTH DAILY 90 tablet 1   montelukast (SINGULAIR) 10 MG tablet Take 1 tablet (10 mg total) by mouth at bedtime. 30 tablet 5   Multiple Vitamin (MULTIVITAMIN WITH MINERALS) TABS tablet Take 1 tablet by mouth daily. (Patient not taking: Reported on 05/19/2022)     NUCALA 100 MG/ML SOAJ Inject 1 mL (100 mg total) into the skin every 28 (twenty-eight) days. 1 mL 11   omeprazole (PRILOSEC) 20 MG capsule Take 1 capsule (20 mg total) by mouth  daily. (Patient not taking: Reported on 05/19/2022) 30 capsule 11   ondansetron (ZOFRAN) 4 MG tablet Take one tablet every 8 hours on injection days. (Patient not taking: Reported on 05/19/2022) 20 tablet 3   ONETOUCH VERIO test strip USE 1 STRIP TO TEST BLOOD SUGAR 8 TIMES DAILY 250 strip 3   topiramate (TOPAMAX) 50 MG tablet Take 1 tablet (50 mg total) by mouth 2 (two) times daily. Please call and make overdue appt for further refills. 2nd attempt 30 tablet 0   Vitamin D, Ergocalciferol, (DRISDOL) 1.25 MG (50000 UNIT) CAPS capsule Take 50,000 Units by mouth every 7 (seven) days.     Current Facility-Administered Medications on File Prior to Visit  Medication Dose Route Frequency Provider Last Rate Last Admin   gadopentetate dimeglumine (MAGNEVIST) injection 20 mL  20 mL Intravenous Once PRN Dohmeier, Porfirio Mylar, MD        Past Surgical History:  Procedure Laterality Date   EYE SURGERY Right 05/30/2020   Vitrectomy, Dr. Luciana Axe   EYE SURGERY  04/2020   IR 3D INDEPENDENT WKST  07/16/2020   IR ANGIO INTRA EXTRACRAN SEL COM CAROTID INNOMINATE BILAT MOD SED  07/16/2020   IR ANGIO VERTEBRAL SEL SUBCLAVIAN INNOMINATE UNI L MOD SED  07/16/2020   IR ANGIO VERTEBRAL SEL VERTEBRAL UNI R MOD SED  07/16/2020   IR US GUIDE VASC ACCESS RIGHT  07/16/2020   REFRACTIVE SURGERY     x9   WISDOM TOOTH EXTRACTION      Allergies  Allergen Reactions   Flonase [Fluticasone] Shortness Of Breath   Sulfa Antibiotics     Shortness of breath, increased heart rate; "happened quite a while ago"   Molds & Smuts Other (See Comments)    Aggravate asthma   Sulfites Itching and Swelling   Tetracyclines & Related Other (See Comments)    increased intracranial pressure   Lactose Nausea Only   Lactose Intolerance (Gi) Diarrhea and Nausea And Vomiting    Upset stomach   White [place]ch Cough    BP 130/82   Ht 6' (1.829 m)   Wt 193 lb (87.5 kg)   BMI 26.18 kg/m      09/11/2020    9:45 AM 06/24/2021   10:44 AM 08/05/2021    10:00 AM 09/16/2021   10:08 AM  [facility] Adult Exercise  Frequency of aerobic exercise (# of days/week) Average time in minutes Frequency of strengthening activities (# of days/week) 0 No data to display              Objective:  Physical Exam:  Gen: NAD, comfortable  in exam room  Right shoulder: No skin breakdown throughout right upper extremity.  Swelling and bruising medial upper arm, proximal posterior forearm. Motion and strength deferred. NV intact distally.   Assessment & Plan:  1. Right proximal humerus fracture - about 4 weeks out now with plans to return to Dr. Ave Filter in a couple weeks.  Continue with sling, icing, tylenol.  He will likely need at least a couple visits of PT.  When beyond 4 weeks can start simple ROM exercises with gravity.  Further recommendations to come at his follow up with Dr. Ave Filter.  Of note he was accepted to waiting list at [facility] however is inactive until humerus fracture is healed enough that he could withstand being on operating table, arm manipulation if needed during surgery.

## 2023-02-25 DIAGNOSIS — F411 Generalized anxiety disorder: Secondary | ICD-10-CM | POA: Diagnosis not present

## 2023-03-04 DIAGNOSIS — E1029 Type 1 diabetes mellitus with other diabetic kidney complication: Secondary | ICD-10-CM | POA: Diagnosis not present

## 2023-03-04 DIAGNOSIS — R809 Proteinuria, unspecified: Secondary | ICD-10-CM | POA: Diagnosis not present

## 2023-03-04 DIAGNOSIS — E1043 Type 1 diabetes mellitus with diabetic autonomic (poly)neuropathy: Secondary | ICD-10-CM | POA: Diagnosis not present

## 2023-03-04 DIAGNOSIS — E10319 Type 1 diabetes mellitus with unspecified diabetic retinopathy without macular edema: Secondary | ICD-10-CM | POA: Diagnosis not present

## 2023-03-04 DIAGNOSIS — F411 Generalized anxiety disorder: Secondary | ICD-10-CM | POA: Diagnosis not present

## 2023-03-05 ENCOUNTER — Other Ambulatory Visit (HOSPITAL_COMMUNITY): Payer: Self-pay | Admitting: Interventional Radiology

## 2023-03-05 ENCOUNTER — Telehealth (HOSPITAL_COMMUNITY): Payer: Self-pay

## 2023-03-05 ENCOUNTER — Other Ambulatory Visit: Payer: Self-pay

## 2023-03-05 DIAGNOSIS — I771 Stricture of artery: Secondary | ICD-10-CM

## 2023-03-05 NOTE — Telephone Encounter (Signed)
Called to inform pt that insurance will only allow him to have his scan done at [facility]. He will give them a call to schedule. AB

## 2023-03-09 ENCOUNTER — Ambulatory Visit: Payer: BC Managed Care – PPO | Admitting: Family Medicine

## 2023-03-09 DIAGNOSIS — M25511 Pain in right shoulder: Secondary | ICD-10-CM | POA: Diagnosis not present

## 2023-03-11 DIAGNOSIS — F411 Generalized anxiety disorder: Secondary | ICD-10-CM | POA: Diagnosis not present

## 2023-03-13 DIAGNOSIS — M25611 Stiffness of right shoulder, not elsewhere classified: Secondary | ICD-10-CM | POA: Diagnosis not present

## 2023-03-17 DIAGNOSIS — E109 Type 1 diabetes mellitus without complications: Secondary | ICD-10-CM | POA: Diagnosis not present

## 2023-03-17 DIAGNOSIS — Z01818 Encounter for other preprocedural examination: Secondary | ICD-10-CM | POA: Diagnosis not present

## 2023-03-17 DIAGNOSIS — R221 Localized swelling, mass and lump, neck: Secondary | ICD-10-CM | POA: Diagnosis not present

## 2023-03-18 DIAGNOSIS — F411 Generalized anxiety disorder: Secondary | ICD-10-CM | POA: Diagnosis not present

## 2023-03-18 DIAGNOSIS — M25611 Stiffness of right shoulder, not elsewhere classified: Secondary | ICD-10-CM | POA: Diagnosis not present

## 2023-03-19 ENCOUNTER — Other Ambulatory Visit (HOSPITAL_COMMUNITY): Payer: Self-pay

## 2023-03-20 DIAGNOSIS — M25611 Stiffness of right shoulder, not elsewhere classified: Secondary | ICD-10-CM | POA: Diagnosis not present

## 2023-03-24 ENCOUNTER — Other Ambulatory Visit: Payer: Self-pay

## 2023-03-24 ENCOUNTER — Ambulatory Visit: Payer: BC Managed Care – PPO | Admitting: Allergy and Immunology

## 2023-03-25 DIAGNOSIS — M25611 Stiffness of right shoulder, not elsewhere classified: Secondary | ICD-10-CM | POA: Diagnosis not present

## 2023-03-25 DIAGNOSIS — F411 Generalized anxiety disorder: Secondary | ICD-10-CM | POA: Diagnosis not present

## 2023-03-27 ENCOUNTER — Other Ambulatory Visit (HOSPITAL_COMMUNITY): Payer: Self-pay

## 2023-03-27 DIAGNOSIS — M25611 Stiffness of right shoulder, not elsewhere classified: Secondary | ICD-10-CM | POA: Diagnosis not present

## 2023-03-30 DIAGNOSIS — M25611 Stiffness of right shoulder, not elsewhere classified: Secondary | ICD-10-CM | POA: Diagnosis not present

## 2023-04-01 DIAGNOSIS — M25611 Stiffness of right shoulder, not elsewhere classified: Secondary | ICD-10-CM | POA: Diagnosis not present

## 2023-04-01 DIAGNOSIS — F411 Generalized anxiety disorder: Secondary | ICD-10-CM | POA: Diagnosis not present

## 2023-04-03 DIAGNOSIS — E1069 Type 1 diabetes mellitus with other specified complication: Secondary | ICD-10-CM | POA: Diagnosis not present

## 2023-04-03 DIAGNOSIS — E1065 Type 1 diabetes mellitus with hyperglycemia: Secondary | ICD-10-CM | POA: Diagnosis not present

## 2023-04-03 DIAGNOSIS — Z794 Long term (current) use of insulin: Secondary | ICD-10-CM | POA: Diagnosis not present

## 2023-04-03 DIAGNOSIS — Z9641 Presence of insulin pump (external) (internal): Secondary | ICD-10-CM | POA: Diagnosis not present

## 2023-04-03 DIAGNOSIS — M25611 Stiffness of right shoulder, not elsewhere classified: Secondary | ICD-10-CM | POA: Diagnosis not present

## 2023-04-06 ENCOUNTER — Ambulatory Visit (INDEPENDENT_AMBULATORY_CARE_PROVIDER_SITE_OTHER): Payer: BC Managed Care – PPO | Admitting: Family Medicine

## 2023-04-06 VITALS — BP 106/64 | Ht 72.0 in | Wt 193.0 lb

## 2023-04-06 DIAGNOSIS — E11649 Type 2 diabetes mellitus with hypoglycemia without coma: Secondary | ICD-10-CM

## 2023-04-06 DIAGNOSIS — G8929 Other chronic pain: Secondary | ICD-10-CM

## 2023-04-06 DIAGNOSIS — M25562 Pain in left knee: Secondary | ICD-10-CM

## 2023-04-07 DIAGNOSIS — M25611 Stiffness of right shoulder, not elsewhere classified: Secondary | ICD-10-CM | POA: Diagnosis not present

## 2023-04-08 ENCOUNTER — Other Ambulatory Visit (HOSPITAL_COMMUNITY): Payer: Self-pay | Admitting: Interventional Radiology

## 2023-04-08 ENCOUNTER — Ambulatory Visit
Admission: RE | Admit: 2023-04-08 | Discharge: 2023-04-08 | Disposition: A | Discharge status: Home / Self Care | Payer: BC Managed Care – PPO | Source: Ambulatory Visit | Attending: Interventional Radiology | Admitting: Interventional Radiology

## 2023-04-08 ENCOUNTER — Encounter: Payer: Self-pay | Admitting: Family Medicine

## 2023-04-08 DIAGNOSIS — I771 Stricture of artery: Secondary | ICD-10-CM

## 2023-04-08 DIAGNOSIS — F411 Generalized anxiety disorder: Secondary | ICD-10-CM | POA: Diagnosis not present

## 2023-04-08 DIAGNOSIS — I6612 Occlusion and stenosis of left anterior cerebral artery: Secondary | ICD-10-CM | POA: Diagnosis not present

## 2023-04-08 DIAGNOSIS — I6602 Occlusion and stenosis of left middle cerebral artery: Secondary | ICD-10-CM | POA: Diagnosis not present

## 2023-04-08 MED ORDER — IOPAMIDOL (ISOVUE-370) INJECTION 76%
75.0000 mL | Freq: Once | INTRAVENOUS | Status: AC | PRN
  Administered 2023-04-08: 75 mL via INTRAVENOUS

## 2023-04-08 NOTE — Progress Notes (Signed)
PCP: [name], PA-C  Subjective:   HPI: Patient is a 42 y.o. male here for follow up.  4/24 Patient reports on 3/30 he stepped on a broken storm drain while walking which gave way causing him to fall down, catching himself with his right arm. Has been seeing Dr. Ave Filter for this - comminuted but nondisplaced proximal humerus fracture. CT scan performed and is being treated conservatively. He is wearing his sling regularly. Icing, taking tylenol if needed - not requiring anything further for pain. Swelling and bruising has been notable down arm but no skin breakdown.  6/10: Patient reports he has been cleared from ortho now for possible pancreatic or islet cell transplant. He's notified [facility] and should be active on the transplant list now. He has been doing physical therapy now for his right shoulder - motion extremely limited as expected following treatment for his fracture. Still with aches and pains - left knee bothering him mainly. Still doing a lot of walking, the home exercises.  Past Medical History:  Diagnosis Date   ADHD (attention deficit hyperactivity disorder)    Anxiety    Asthma    Autonomic neuropathy due to diabetes (HCC)    Chronic headaches    Diabetes (HCC)    Fatigue    GERD (gastroesophageal reflux disease)    Goiter    High blood pressure    High cholesterol    Hypercholesterolemia    Hypertension    Hypoglycemia associated with diabetes (HCC)    Malnutrition (HCC)    Obesity    Sleep apnea    mild, no cpap   Tachycardia    Type 1 diabetes mellitus not at goal [facility])    Uncontrolled DM with microalbuminuria or microproteinuria     Current Outpatient Medications on File Prior to Visit  Medication Sig Dispense Refill   acetaZOLAMIDE ER (DIAMOX) 500 MG capsule 625 mg. (Patient not taking: Reported on 05/19/2022)     albuterol (VENTOLIN HFA) 108 (90 Base) MCG/ACT inhaler Inhale 2 puffs into the lungs every 4 (four) hours as needed for wheezing  or shortness of breath. 18 g 1   atorvastatin (LIPITOR) 20 MG tablet Take 20 mg by mouth daily.   0   BAQSIMI TWO PACK 3 MG/DOSE POWD USE 1 SPRAY INTO THE NOSE AS DIRECTED 2 each 3   Continuous Blood Gluc Receiver (DEXCOM G6 RECEIVER) DEVI 1 Device by Does not apply route as directed. 1 each 2   Continuous Blood Gluc Sensor (DEXCOM G6 SENSOR) MISC INJECT 1 SENSOR UNDER THE SKIN AS DIRECTED( CHANGE SENSOR EVERY 10 DAYS) 3 each 11   Continuous Blood Gluc Sensor (FREESTYLE LIBRE 2 SENSOR) MISC USE EVERY 14 DAYS. USE IF DEXCOM FAILS (Patient not taking: Reported on 05/19/2022) 2 each 5   Continuous Blood Gluc Transmit (DEXCOM G6 TRANSMITTER) MISC USE AS DIRECTED WITH SENSOR UP TO 8 TIMES 1 each 2   EPINEPHrine (AUVI-Q) 0.3 mg/0.3 mL IJ SOAJ injection Use as directed for life-threatening allergic reaction. (Patient not taking: Reported on 05/19/2022) 2 each 2   fluticasone-salmeterol (WIXELA INHUB) 250-50 MCG/ACT AEPB Inhale 1 puff into the lungs in the morning and at bedtime. 60 each 5   HUMALOG 100 UNIT/ML injection Use 300 units in insulin pump every 48 hours (Patient not taking: Reported on 05/19/2022) 120 mL 1   insulin aspart (NOVOLOG) 100 UNIT/ML injection Use up to 300 units in pump every 48 hours 120 mL 3   Insulin Lispro Junior KwikPen (HUMALOG  JR) 100 UNIT/ML KwikPen ADMINISTER UP TO 50 UNITS UNDER THE SKIN DAILY AS DIRECTED BY PRESCRIBER (Patient not taking: Reported on 05/19/2022) 15 mL 11   Insulin Pen Needle (PEN NEEDLES) 32G X 4 MM MISC Use to inject insulin 6x per day (Patient not taking: Reported on 05/19/2022) 200 each 5   levocetirizine (XYZAL) 5 MG tablet Take 1 tablet (5 mg total) by mouth daily as needed for allergies (Can take an extra dose during flare ups.). (Patient not taking: Reported on 05/19/2022) 60 tablet 5   lipase/protease/amylase (CREON) 36000 UNITS CPEP capsule 3 pills with every meal and 2 pills with every snack 390 capsule 11   losartan (COZAAR) 50 MG tablet TAKE 1  TABLET(50 MG) BY MOUTH DAILY 90 tablet 1   montelukast (SINGULAIR) 10 MG tablet Take 1 tablet (10 mg total) by mouth at bedtime. 30 tablet 5   Multiple Vitamin (MULTIVITAMIN WITH MINERALS) TABS tablet Take 1 tablet by mouth daily. (Patient not taking: Reported on 05/19/2022)     NUCALA 100 MG/ML SOAJ Inject 1 mL (100 mg total) into the skin every 28 (twenty-eight) days. 1 mL 11   omeprazole (PRILOSEC) 20 MG capsule Take 1 capsule (20 mg total) by mouth daily. (Patient not taking: Reported on 05/19/2022) 30 capsule 11   ondansetron (ZOFRAN) 4 MG tablet Take one tablet every 8 hours on injection days. (Patient not taking: Reported on 05/19/2022) 20 tablet 3   ONETOUCH VERIO test strip USE 1 STRIP TO TEST BLOOD SUGAR 8 TIMES DAILY 250 strip 3   topiramate (TOPAMAX) 50 MG tablet Take 1 tablet (50 mg total) by mouth 2 (two) times daily. Please call and make overdue appt for further refills. 2nd attempt 30 tablet 0   Vitamin D, Ergocalciferol, (DRISDOL) 1.25 MG (50000 UNIT) CAPS capsule Take 50,000 Units by mouth every 7 (seven) days.     Current Facility-Administered Medications on File Prior to Visit  Medication Dose Route Frequency Provider Last Rate Last Admin   gadopentetate dimeglumine (MAGNEVIST) injection 20 mL  20 mL Intravenous Once PRN Dohmeier, Porfirio Mylar, MD        Past Surgical History:  Procedure Laterality Date   EYE SURGERY Right 05/30/2020   Vitrectomy, Dr. Luciana Axe   EYE SURGERY  04/2020   IR 3D INDEPENDENT WKST  07/16/2020   IR ANGIO INTRA EXTRACRAN SEL COM CAROTID INNOMINATE BILAT MOD SED  07/16/2020   IR ANGIO VERTEBRAL SEL SUBCLAVIAN INNOMINATE UNI L MOD SED  07/16/2020   IR ANGIO VERTEBRAL SEL VERTEBRAL UNI R MOD SED  07/16/2020   IR US GUIDE VASC ACCESS RIGHT  07/16/2020   REFRACTIVE SURGERY     x9   WISDOM TOOTH EXTRACTION      Allergies  Allergen Reactions   Flonase [Fluticasone] Shortness Of Breath   Sulfa Antibiotics     Shortness of breath, increased heart rate; "happened  quite a while ago"   Molds & Smuts Other (See Comments)    Aggravate asthma   Sulfites Itching and Swelling   Tetracyclines & Related Other (See Comments)    increased intracranial pressure   Lactose Nausea Only   Lactose Intolerance (Gi) Diarrhea and Nausea And Vomiting    Upset stomach   White [place]ch Cough    BP 106/64   Ht 6' (1.829 m)   Wt 193 lb (87.5 kg)   BMI 26.18 kg/m      09/11/2020    9:45 AM 06/24/2021   10:44 AM 08/05/2021   10:00  AM 09/16/2021   10:08 AM  [facility] Adult Exercise  Frequency of aerobic exercise (# of days/week) 5 3 3 3   Average time in minutes 30 15 15 15   Frequency of strengthening activities (# of days/week) 0 1 1 1         No data to display              Objective:  Physical Exam:  Gen: NAD, comfortable in exam room  Left knee: No gross deformity, ecchymoses, swelling. No TTP joint lines. FROM with normal strength. Negative ant/post drawers. Negative valgus/varus testing. Negative lachman.  Negative mcmurrays, apleys.  NV intact distally.   Assessment & Plan:  1. Right proximal humerus fracture - now in physical therapy for this - discussed long process to be able to regain his motion and strength.    2. Diabetes - should be active now on list at [facility] for transplant but he will call this week to make sure.  3. Left knee pain - no concerning findings on exam.  Continue home exercises.  Related to deconditioning.

## 2023-04-09 DIAGNOSIS — G932 Benign intracranial hypertension: Secondary | ICD-10-CM | POA: Diagnosis not present

## 2023-04-09 DIAGNOSIS — H2513 Age-related nuclear cataract, bilateral: Secondary | ICD-10-CM | POA: Diagnosis not present

## 2023-04-09 DIAGNOSIS — H43822 Vitreomacular adhesion, left eye: Secondary | ICD-10-CM | POA: Diagnosis not present

## 2023-04-09 DIAGNOSIS — E103553 Type 1 diabetes mellitus with stable proliferative diabetic retinopathy, bilateral: Secondary | ICD-10-CM | POA: Diagnosis not present

## 2023-04-09 DIAGNOSIS — M25611 Stiffness of right shoulder, not elsewhere classified: Secondary | ICD-10-CM | POA: Diagnosis not present

## 2023-04-13 DIAGNOSIS — M25611 Stiffness of right shoulder, not elsewhere classified: Secondary | ICD-10-CM | POA: Diagnosis not present

## 2023-04-14 DIAGNOSIS — F411 Generalized anxiety disorder: Secondary | ICD-10-CM | POA: Diagnosis not present

## 2023-04-15 DIAGNOSIS — F411 Generalized anxiety disorder: Secondary | ICD-10-CM | POA: Diagnosis not present

## 2023-04-20 DIAGNOSIS — Z09 Encounter for follow-up examination after completed treatment for conditions other than malignant neoplasm: Secondary | ICD-10-CM | POA: Diagnosis not present

## 2023-04-20 DIAGNOSIS — M25611 Stiffness of right shoulder, not elsewhere classified: Secondary | ICD-10-CM | POA: Diagnosis not present

## 2023-04-21 ENCOUNTER — Ambulatory Visit (INDEPENDENT_AMBULATORY_CARE_PROVIDER_SITE_OTHER): Payer: BC Managed Care – PPO | Admitting: Allergy and Immunology

## 2023-04-21 VITALS — BP 128/82 | HR 96 | Temp 99.8°F | Resp 16 | Ht 70.5 in | Wt 196.5 lb

## 2023-04-21 DIAGNOSIS — M25611 Stiffness of right shoulder, not elsewhere classified: Secondary | ICD-10-CM | POA: Diagnosis not present

## 2023-04-21 DIAGNOSIS — J4550 Severe persistent asthma, uncomplicated: Secondary | ICD-10-CM | POA: Diagnosis not present

## 2023-04-21 DIAGNOSIS — J3089 Other allergic rhinitis: Secondary | ICD-10-CM | POA: Diagnosis not present

## 2023-04-21 DIAGNOSIS — J302 Other seasonal allergic rhinitis: Secondary | ICD-10-CM

## 2023-04-21 MED ORDER — LEVOCETIRIZINE DIHYDROCHLORIDE 5 MG PO TABS: 5.0000 mg | ORAL_TABLET | Freq: Every day | ORAL | 1 refills | Status: AC | PRN

## 2023-04-21 MED ORDER — AIRSUPRA 90-80 MCG/ACT IN AERO: 2.0000 | INHALATION_SPRAY | RESPIRATORY_TRACT | 1 refills | Status: DC | PRN

## 2023-04-21 NOTE — Progress Notes (Unsigned)
[name] [facility] [place] [facility] [place] [facility] [place] [facility] [name]   Follow-up Note  Referring Provider: Lucila Maine Primary Provider: Lucila Maine Date of Office Visit: 04/21/2023  Subjective:   [name] (DOB: [birth year]) is a 42 y.o. male who [facility] in re-evaluation of the following:  HPI: [name] returns to this clinic in reevaluation of asthma and allergic rhinitis.  I last saw him in this clinic 23 September 2022.  He is really doing wonderful with his airway and has not had any significant issues involving either his upper or lower airway and has no need to use the short acting bronchodilator and can exert himself without much problem while he continues on Mepolizumab injections.  He has stopped all of his other controller agents including Wixela and montelukast.  He is now on the transplant list for a pancreas.  Allergies as of 04/21/2023       Reactions   Fluticasone Shortness Of Breath, Cough   Tachycardia   Sulfa Antibiotics Nausea Only, Shortness Of Breath   Shortness of breath, increased heart rate; "happened quite a while ago"   Sulfites Itching, Swelling, Nausea And Vomiting, Cough, Palpitations   Tachycardia, flushing   Lactose Nausea Only, Diarrhea, Nausea And Vomiting   Upset stomach   Molds & Smuts Other (See Comments), Cough, Rash   Aggravate asthma   Tetracyclines & Related Other (See Comments)   increased intracranial pressure Other Reaction(s): craninal pressure increase Increased Intracranial pressure   increased intracranial pressure increased intracranial pressure, increased intracranial pressure   Betula Alba Oil Rash   Lactose Intolerance (gi) Diarrhea, Nausea And Vomiting   Upset stomach   Pollen Extract Cough   White [place]ch Cough        Medication List    acetaZOLAMIDE ER 500 MG capsule Commonly known as: DIAMOX 625 mg.   albuterol 108 (90 Base) MCG/ACT  inhaler Commonly known as: Ventolin HFA Inhale 2 puffs into the lungs every 4 (four) hours as needed for wheezing or shortness of breath.   atorvastatin 20 MG tablet Commonly known as: LIPITOR Take 20 mg by mouth daily.   Baqsimi Two Pack 3 MG/DOSE Powd Generic drug: Glucagon USE 1 SPRAY INTO THE NOSE AS DIRECTED   Dexcom G6 Receiver Devi 1 Device by Does not apply route as directed.   Dexcom G6 Transmitter Misc USE AS DIRECTED WITH SENSOR UP TO 8 TIMES   EPINEPHrine 0.3 mg/0.3 mL Soaj injection Commonly known as: Auvi-Q Use as directed for life-threatening allergic reaction.   fluticasone-salmeterol 250-50 MCG/ACT Aepb Commonly known as: Wixela Inhub Inhale 1 puff into the lungs in the morning and at bedtime.   FreeStyle Libre 2 Sensor Misc USE EVERY 14 DAYS. USE IF DEXCOM FAILS   Dexcom G6 Sensor Misc INJECT 1 SENSOR UNDER THE SKIN AS DIRECTED( CHANGE SENSOR EVERY 10 DAYS)   HumaLOG 100 UNIT/ML injection Generic drug: insulin lispro Use 300 units in insulin pump every 48 hours   Insulin Lispro Junior KwikPen 100 UNIT/ML KwikPen Commonly known as: HUMALOG JR ADMINISTER UP TO 50 UNITS UNDER THE SKIN DAILY AS DIRECTED BY PRESCRIBER   insulin aspart 100 UNIT/ML injection Commonly known as: novoLOG Use up to 300 units in pump every 48 hours   levocetirizine 5 MG tablet Commonly known as: Xyzal Take 1 tablet (5 mg total) by mouth daily as needed for allergies (Can take an extra dose during flare ups.).  lipase/protease/amylase 78469 UNITS Cpep capsule Commonly known as: CREON 3 pills with every meal and 2 pills with every snack   losartan 50 MG tablet Commonly known as: COZAAR TAKE 1 TABLET(50 MG) BY MOUTH DAILY   montelukast 10 MG tablet Commonly known as: SINGULAIR Take 1 tablet (10 mg total) by mouth at bedtime.   multivitamin with minerals Tabs tablet Take 1 tablet by mouth daily.   Nucala 100 MG/ML Soaj Generic drug: Mepolizumab Inject 1 mL (100 mg  total) into the skin every 28 (twenty-eight) days.   omeprazole 20 MG capsule Commonly known as: PRILOSEC Take 1 capsule (20 mg total) by mouth daily.   OneTouch Verio test strip Generic drug: glucose blood USE 1 STRIP TO TEST BLOOD SUGAR 8 TIMES DAILY   Pen Needles 32G X 4 MM Misc Use to inject insulin 6x per day   topiramate 50 MG tablet Commonly known as: TOPAMAX Take 1 tablet (50 mg total) by mouth 2 (two) times daily. Please call and make overdue appt for further refills. 2nd attempt   Vitamin D (Ergocalciferol) 1.25 MG (50000 UNIT) Caps capsule Commonly known as: DRISDOL Take 50,000 Units by mouth every 7 (seven) days.    Past Medical History:  Diagnosis Date   ADHD (attention deficit hyperactivity disorder)    Anxiety    Asthma    Autonomic neuropathy due to diabetes (HCC)    Chronic headaches    Diabetes (HCC)    Fatigue    GERD (gastroesophageal reflux disease)    Goiter    High blood pressure    High cholesterol    Hypercholesterolemia    Hypertension    Hypoglycemia associated with diabetes (HCC)    Malnutrition (HCC)    Obesity    Sleep apnea    mild, no cpap   Tachycardia    Type 1 diabetes mellitus not at goal [facility])    Uncontrolled DM with microalbuminuria or microproteinuria     Past Surgical History:  Procedure Laterality Date   EYE SURGERY Right 05/30/2020   Vitrectomy, Dr. Luciana Axe   EYE SURGERY  04/2020   IR 3D INDEPENDENT WKST  07/16/2020   IR ANGIO INTRA EXTRACRAN SEL COM CAROTID INNOMINATE BILAT MOD SED  07/16/2020   IR ANGIO VERTEBRAL SEL SUBCLAVIAN INNOMINATE UNI L MOD SED  07/16/2020   IR ANGIO VERTEBRAL SEL VERTEBRAL UNI R MOD SED  07/16/2020   IR US GUIDE VASC ACCESS RIGHT  07/16/2020   REFRACTIVE SURGERY     x9   WISDOM TOOTH EXTRACTION      Review of systems negative except as noted in HPI / PMHx or noted below:  Review of Systems  Constitutional: Negative.   HENT: Negative.    Eyes: Negative.   Respiratory: Negative.     Cardiovascular: Negative.   Gastrointestinal: Negative.   Genitourinary: Negative.   Musculoskeletal: Negative.   Skin: Negative.   Neurological: Negative.   Endo/Heme/Allergies: Negative.   Psychiatric/Behavioral: Negative.       Objective:   Vitals:   04/21/23 1354  BP: 128/82  Pulse: 96  Resp: 16  Temp: 99.8 F (37.7 C)  SpO2: 96%   Height: 5' 10.5" (179.1 cm)  Weight: 196 lb 8 oz (89.1 kg)   Physical Exam Constitutional:      Appearance: He is not diaphoretic.  HENT:     Head: Normocephalic.     Right Ear: Tympanic membrane, ear canal and external ear normal.     Left Ear: Tympanic membrane,  ear canal and external ear normal.     Nose: Nose normal. No mucosal edema or rhinorrhea.     Mouth/Throat:     Pharynx: Uvula midline. No oropharyngeal exudate.  Eyes:     Conjunctiva/sclera: Conjunctivae normal.  Neck:     Thyroid: No thyromegaly.     Trachea: Trachea normal. No tracheal tenderness or tracheal deviation.  Cardiovascular:     Rate and Rhythm: Normal rate and regular rhythm.     Heart sounds: Normal heart sounds, S1 normal and S2 normal. No murmur heard. Pulmonary:     Effort: No respiratory distress.     Breath sounds: Normal breath sounds. No stridor. No wheezing or rales.  Lymphadenopathy:     Head:     Right side of head: No tonsillar adenopathy.     Left side of head: No tonsillar adenopathy.     Cervical: No cervical adenopathy.  Skin:    Findings: No erythema or rash.     Nails: There is no clubbing.  Neurological:     Mental Status: He is alert.     Diagnostics:    Spirometry was performed and demonstrated an FEV1 of 3.96 at 93 % of predicted.  Assessment and Plan:   1. Asthma, severe persistent, well-controlled   2. Seasonal and perennial allergic rhinitis    1. Continue to Treat and prevent inflammation of airway:   A. Mepolizumab injections monthly   2. If needed:   A. nasal saline / wash  B. OTC antihistamine  C.  AIRSUPRA - 2 inhalations or albuterol every 4-6 hours  3. Return in 6 months or earlier if problem  4. Plan for fall flu vaccine   [name] is doing very well while using anti-IL-5 biologic agent to control his multiorgan eosinophilic driven airway disease and he will continue on this medication until I can see him back in this clinic in 6 months.  In addition, I have given him an anti-inflammatory rescue medicine to be used should he be required.  Laurette Schimke, MD Allergy / Immunology [facility]

## 2023-04-21 NOTE — Patient Instructions (Addendum)
  1. Continue to Treat and prevent inflammation of airway:   A. Mepolizumab injections monthly   2. If needed:   A. nasal saline / wash  B. OTC antihistamine  C. AIRSUPRA - 2 inhalations or albuterol every 4-6 hours  3. Return in 6 months or earlier if problem  4. Plan for fall flu vaccine

## 2023-04-22 ENCOUNTER — Encounter: Payer: Self-pay | Admitting: Allergy and Immunology

## 2023-04-22 ENCOUNTER — Other Ambulatory Visit (HOSPITAL_COMMUNITY): Payer: Self-pay

## 2023-04-23 ENCOUNTER — Other Ambulatory Visit: Payer: Self-pay

## 2023-04-23 DIAGNOSIS — M25611 Stiffness of right shoulder, not elsewhere classified: Secondary | ICD-10-CM | POA: Diagnosis not present

## 2023-04-24 DIAGNOSIS — M25611 Stiffness of right shoulder, not elsewhere classified: Secondary | ICD-10-CM | POA: Diagnosis not present

## 2023-04-27 ENCOUNTER — Telehealth (HOSPITAL_COMMUNITY): Payer: Self-pay

## 2023-04-27 DIAGNOSIS — M25611 Stiffness of right shoulder, not elsewhere classified: Secondary | ICD-10-CM | POA: Diagnosis not present

## 2023-04-27 NOTE — Telephone Encounter (Signed)
Pt agreed to f/u in 6 months with a cta head/neck. AB  

## 2023-04-29 DIAGNOSIS — M25611 Stiffness of right shoulder, not elsewhere classified: Secondary | ICD-10-CM | POA: Diagnosis not present

## 2023-05-04 DIAGNOSIS — Z7682 Awaiting organ transplant status: Secondary | ICD-10-CM | POA: Diagnosis not present

## 2023-05-04 DIAGNOSIS — E109 Type 1 diabetes mellitus without complications: Secondary | ICD-10-CM | POA: Diagnosis not present

## 2023-05-04 DIAGNOSIS — E785 Hyperlipidemia, unspecified: Secondary | ICD-10-CM | POA: Diagnosis not present

## 2023-05-04 DIAGNOSIS — E559 Vitamin D deficiency, unspecified: Secondary | ICD-10-CM | POA: Diagnosis not present

## 2023-05-05 DIAGNOSIS — M25611 Stiffness of right shoulder, not elsewhere classified: Secondary | ICD-10-CM | POA: Diagnosis not present

## 2023-05-06 DIAGNOSIS — F411 Generalized anxiety disorder: Secondary | ICD-10-CM | POA: Diagnosis not present

## 2023-05-07 DIAGNOSIS — M25611 Stiffness of right shoulder, not elsewhere classified: Secondary | ICD-10-CM | POA: Diagnosis not present

## 2023-05-11 DIAGNOSIS — M25611 Stiffness of right shoulder, not elsewhere classified: Secondary | ICD-10-CM | POA: Diagnosis not present

## 2023-05-12 DIAGNOSIS — E559 Vitamin D deficiency, unspecified: Secondary | ICD-10-CM | POA: Diagnosis not present

## 2023-05-12 DIAGNOSIS — E785 Hyperlipidemia, unspecified: Secondary | ICD-10-CM | POA: Diagnosis not present

## 2023-05-12 DIAGNOSIS — E109 Type 1 diabetes mellitus without complications: Secondary | ICD-10-CM | POA: Diagnosis not present

## 2023-05-13 DIAGNOSIS — F411 Generalized anxiety disorder: Secondary | ICD-10-CM | POA: Diagnosis not present

## 2023-05-13 DIAGNOSIS — M25611 Stiffness of right shoulder, not elsewhere classified: Secondary | ICD-10-CM | POA: Diagnosis not present

## 2023-05-14 ENCOUNTER — Encounter (INDEPENDENT_AMBULATORY_CARE_PROVIDER_SITE_OTHER): Payer: Self-pay

## 2023-05-15 DIAGNOSIS — M25611 Stiffness of right shoulder, not elsewhere classified: Secondary | ICD-10-CM | POA: Diagnosis not present

## 2023-05-19 ENCOUNTER — Other Ambulatory Visit: Payer: Self-pay

## 2023-05-20 DIAGNOSIS — F411 Generalized anxiety disorder: Secondary | ICD-10-CM | POA: Diagnosis not present

## 2023-05-20 DIAGNOSIS — M25611 Stiffness of right shoulder, not elsewhere classified: Secondary | ICD-10-CM | POA: Diagnosis not present

## 2023-05-22 DIAGNOSIS — M25611 Stiffness of right shoulder, not elsewhere classified: Secondary | ICD-10-CM | POA: Diagnosis not present

## 2023-05-25 ENCOUNTER — Other Ambulatory Visit: Payer: Self-pay

## 2023-05-25 DIAGNOSIS — E109 Type 1 diabetes mellitus without complications: Secondary | ICD-10-CM | POA: Diagnosis not present

## 2023-05-26 DIAGNOSIS — I151 Hypertension secondary to other renal disorders: Secondary | ICD-10-CM | POA: Diagnosis not present

## 2023-05-26 DIAGNOSIS — E10319 Type 1 diabetes mellitus with unspecified diabetic retinopathy without macular edema: Secondary | ICD-10-CM | POA: Diagnosis not present

## 2023-05-26 DIAGNOSIS — Z7682 Awaiting organ transplant status: Secondary | ICD-10-CM | POA: Diagnosis not present

## 2023-05-26 DIAGNOSIS — Z01818 Encounter for other preprocedural examination: Secondary | ICD-10-CM | POA: Diagnosis not present

## 2023-05-26 DIAGNOSIS — I129 Hypertensive chronic kidney disease with stage 1 through stage 4 chronic kidney disease, or unspecified chronic kidney disease: Secondary | ICD-10-CM | POA: Diagnosis not present

## 2023-05-26 DIAGNOSIS — K8681 Exocrine pancreatic insufficiency: Secondary | ICD-10-CM | POA: Diagnosis not present

## 2023-05-27 DIAGNOSIS — F411 Generalized anxiety disorder: Secondary | ICD-10-CM | POA: Diagnosis not present

## 2023-05-28 DIAGNOSIS — M25611 Stiffness of right shoulder, not elsewhere classified: Secondary | ICD-10-CM | POA: Diagnosis not present

## 2023-06-01 ENCOUNTER — Ambulatory Visit: Payer: BC Managed Care – PPO | Admitting: Family Medicine

## 2023-06-01 DIAGNOSIS — M7501 Adhesive capsulitis of right shoulder: Secondary | ICD-10-CM | POA: Diagnosis not present

## 2023-06-02 ENCOUNTER — Ambulatory Visit: Payer: BC Managed Care – PPO | Admitting: Family Medicine

## 2023-06-02 DIAGNOSIS — M659 Synovitis and tenosynovitis, unspecified: Secondary | ICD-10-CM | POA: Diagnosis not present

## 2023-06-02 DIAGNOSIS — G8918 Other acute postprocedural pain: Secondary | ICD-10-CM | POA: Diagnosis not present

## 2023-06-02 DIAGNOSIS — M7501 Adhesive capsulitis of right shoulder: Secondary | ICD-10-CM | POA: Diagnosis not present

## 2023-06-03 DIAGNOSIS — F411 Generalized anxiety disorder: Secondary | ICD-10-CM | POA: Diagnosis not present

## 2023-06-03 DIAGNOSIS — M25611 Stiffness of right shoulder, not elsewhere classified: Secondary | ICD-10-CM | POA: Diagnosis not present

## 2023-06-05 DIAGNOSIS — M25611 Stiffness of right shoulder, not elsewhere classified: Secondary | ICD-10-CM | POA: Diagnosis not present

## 2023-06-08 DIAGNOSIS — M25611 Stiffness of right shoulder, not elsewhere classified: Secondary | ICD-10-CM | POA: Diagnosis not present

## 2023-06-09 DIAGNOSIS — M25611 Stiffness of right shoulder, not elsewhere classified: Secondary | ICD-10-CM | POA: Diagnosis not present

## 2023-06-10 ENCOUNTER — Other Ambulatory Visit: Payer: Self-pay | Admitting: Allergy and Immunology

## 2023-06-10 DIAGNOSIS — E1043 Type 1 diabetes mellitus with diabetic autonomic (poly)neuropathy: Secondary | ICD-10-CM | POA: Diagnosis not present

## 2023-06-10 DIAGNOSIS — Z7682 Awaiting organ transplant status: Secondary | ICD-10-CM | POA: Diagnosis not present

## 2023-06-10 DIAGNOSIS — R760 Raised antibody titer: Secondary | ICD-10-CM | POA: Diagnosis not present

## 2023-06-11 DIAGNOSIS — E109 Type 1 diabetes mellitus without complications: Secondary | ICD-10-CM | POA: Diagnosis not present

## 2023-06-11 DIAGNOSIS — R760 Raised antibody titer: Secondary | ICD-10-CM | POA: Diagnosis not present

## 2023-06-11 DIAGNOSIS — Z01818 Encounter for other preprocedural examination: Secondary | ICD-10-CM | POA: Diagnosis not present

## 2023-06-15 DIAGNOSIS — M25611 Stiffness of right shoulder, not elsewhere classified: Secondary | ICD-10-CM | POA: Diagnosis not present

## 2023-06-17 DIAGNOSIS — F411 Generalized anxiety disorder: Secondary | ICD-10-CM | POA: Diagnosis not present

## 2023-06-17 DIAGNOSIS — N50812 Left testicular pain: Secondary | ICD-10-CM | POA: Diagnosis not present

## 2023-06-17 DIAGNOSIS — R102 Pelvic and perineal pain: Secondary | ICD-10-CM | POA: Diagnosis not present

## 2023-06-18 ENCOUNTER — Other Ambulatory Visit (HOSPITAL_COMMUNITY): Payer: Self-pay

## 2023-06-19 DIAGNOSIS — M25611 Stiffness of right shoulder, not elsewhere classified: Secondary | ICD-10-CM | POA: Diagnosis not present

## 2023-06-22 DIAGNOSIS — M25611 Stiffness of right shoulder, not elsewhere classified: Secondary | ICD-10-CM | POA: Diagnosis not present

## 2023-06-23 DIAGNOSIS — M25611 Stiffness of right shoulder, not elsewhere classified: Secondary | ICD-10-CM | POA: Diagnosis not present

## 2023-06-24 DIAGNOSIS — F411 Generalized anxiety disorder: Secondary | ICD-10-CM | POA: Diagnosis not present

## 2023-06-30 DIAGNOSIS — M25611 Stiffness of right shoulder, not elsewhere classified: Secondary | ICD-10-CM | POA: Diagnosis not present

## 2023-07-01 DIAGNOSIS — F411 Generalized anxiety disorder: Secondary | ICD-10-CM | POA: Diagnosis not present

## 2023-07-03 DIAGNOSIS — M25611 Stiffness of right shoulder, not elsewhere classified: Secondary | ICD-10-CM | POA: Diagnosis not present

## 2023-07-07 ENCOUNTER — Other Ambulatory Visit (HOSPITAL_COMMUNITY): Payer: Self-pay

## 2023-07-07 DIAGNOSIS — M25611 Stiffness of right shoulder, not elsewhere classified: Secondary | ICD-10-CM | POA: Diagnosis not present

## 2023-07-08 DIAGNOSIS — F411 Generalized anxiety disorder: Secondary | ICD-10-CM | POA: Diagnosis not present

## 2023-07-09 DIAGNOSIS — M25611 Stiffness of right shoulder, not elsewhere classified: Secondary | ICD-10-CM | POA: Diagnosis not present

## 2023-07-13 DIAGNOSIS — I10 Essential (primary) hypertension: Secondary | ICD-10-CM | POA: Diagnosis not present

## 2023-07-13 DIAGNOSIS — E109 Type 1 diabetes mellitus without complications: Secondary | ICD-10-CM | POA: Diagnosis not present

## 2023-07-14 DIAGNOSIS — M25611 Stiffness of right shoulder, not elsewhere classified: Secondary | ICD-10-CM | POA: Diagnosis not present

## 2023-07-15 DIAGNOSIS — F411 Generalized anxiety disorder: Secondary | ICD-10-CM | POA: Diagnosis not present

## 2023-07-16 DIAGNOSIS — Z9641 Presence of insulin pump (external) (internal): Secondary | ICD-10-CM | POA: Diagnosis not present

## 2023-07-16 DIAGNOSIS — M25611 Stiffness of right shoulder, not elsewhere classified: Secondary | ICD-10-CM | POA: Diagnosis not present

## 2023-07-16 DIAGNOSIS — Z794 Long term (current) use of insulin: Secondary | ICD-10-CM | POA: Diagnosis not present

## 2023-07-17 DIAGNOSIS — Z794 Long term (current) use of insulin: Secondary | ICD-10-CM | POA: Diagnosis not present

## 2023-07-17 DIAGNOSIS — Z9641 Presence of insulin pump (external) (internal): Secondary | ICD-10-CM | POA: Diagnosis not present

## 2023-07-20 ENCOUNTER — Ambulatory Visit: Payer: BC Managed Care – PPO | Admitting: Family Medicine

## 2023-07-20 ENCOUNTER — Other Ambulatory Visit: Payer: Self-pay

## 2023-07-20 VITALS — BP 114/67 | Ht 71.0 in | Wt 190.0 lb

## 2023-07-20 DIAGNOSIS — M79672 Pain in left foot: Secondary | ICD-10-CM

## 2023-07-21 ENCOUNTER — Encounter: Payer: Self-pay | Admitting: Family Medicine

## 2023-07-21 NOTE — Progress Notes (Signed)
PCP: Dani Gobble, PA-C  Subjective:   HPI: Patient is a 42 y.o. male here for left foot pain.  Patient reports for the past couple weeks he's had pain on plantar lateral left forefoot. No injury or trauma. Worse with walking and better with shoes compared to walking barefoot. No bruising, swelling. Has not altered his activity level.  Past Medical History:  Diagnosis Date   ADHD (attention deficit hyperactivity disorder)    Anxiety    Asthma    Autonomic neuropathy due to diabetes (HCC)    Chronic headaches    Diabetes (HCC)    Fatigue    GERD (gastroesophageal reflux disease)    Goiter    High blood pressure    High cholesterol    Hypercholesterolemia    Hypertension    Hypoglycemia associated with diabetes (HCC)    Malnutrition (HCC)    Obesity    Sleep apnea    mild, no cpap   Tachycardia    Type 1 diabetes mellitus not at goal [facility])    Uncontrolled DM with microalbuminuria or microproteinuria     Current Outpatient Medications on File Prior to Visit  Medication Sig Dispense Refill   acetaZOLAMIDE ER (DIAMOX) 500 MG capsule 625 mg.     albuterol (VENTOLIN HFA) 108 (90 Base) MCG/ACT inhaler INHALE 2 PUFFS INTO THE LUNGS EVERY 4 HOURS AS NEEDED FOR WHEEZING OR SHORTNESS OF BREATH 8.5 g 1   Albuterol-Budesonide (AIRSUPRA) 90-80 MCG/ACT AERO Inhale 2 puffs into the lungs every 4 (four) hours as needed. 11 g 1   atorvastatin (LIPITOR) 20 MG tablet Take 20 mg by mouth daily.   0   BAQSIMI TWO PACK 3 MG/DOSE POWD USE 1 SPRAY INTO THE NOSE AS DIRECTED 2 each 3   Continuous Blood Gluc Receiver (DEXCOM G6 RECEIVER) DEVI 1 Device by Does not apply route as directed. 1 each 2   Continuous Blood Gluc Sensor (DEXCOM G6 SENSOR) MISC INJECT 1 SENSOR UNDER THE SKIN AS DIRECTED( CHANGE SENSOR EVERY 10 DAYS) 3 each 11   Continuous Blood Gluc Sensor (FREESTYLE LIBRE 2 SENSOR) MISC USE EVERY 14 DAYS. USE IF DEXCOM FAILS 2 each 5   Continuous Blood Gluc Transmit (DEXCOM G6  TRANSMITTER) MISC USE AS DIRECTED WITH SENSOR UP TO 8 TIMES 1 each 2   EPINEPHrine (AUVI-Q) 0.3 mg/0.3 mL IJ SOAJ injection Use as directed for life-threatening allergic reaction. 2 each 2   fluticasone-salmeterol (WIXELA INHUB) 250-50 MCG/ACT AEPB Inhale 1 puff into the lungs in the morning and at bedtime. 60 each 5   HUMALOG 100 UNIT/ML injection Use 300 units in insulin pump every 48 hours 120 mL 1   insulin aspart (NOVOLOG) 100 UNIT/ML injection Use up to 300 units in pump every 48 hours 120 mL 3   Insulin Lispro Junior KwikPen (HUMALOG JR) 100 UNIT/ML KwikPen ADMINISTER UP TO 50 UNITS UNDER THE SKIN DAILY AS DIRECTED BY PRESCRIBER 15 mL 11   Insulin Pen Needle (PEN NEEDLES) 32G X 4 MM MISC Use to inject insulin 6x per day 200 each 5   levocetirizine (XYZAL) 5 MG tablet Take 1 tablet (5 mg total) by mouth daily as needed for allergies (Can take an extra dose during flare ups.). 180 tablet 1   lipase/protease/amylase (CREON) 36000 UNITS CPEP capsule 3 pills with every meal and 2 pills with every snack 390 capsule 11   losartan (COZAAR) 50 MG tablet TAKE 1 TABLET(50 MG) BY MOUTH DAILY 90 tablet 1   montelukast (  SINGULAIR) 10 MG tablet Take 1 tablet (10 mg total) by mouth at bedtime. 30 tablet 5   Multiple Vitamin (MULTIVITAMIN WITH MINERALS) TABS tablet Take 1 tablet by mouth daily.     NUCALA 100 MG/ML SOAJ Inject 1 mL (100 mg total) into the skin every 28 (twenty-eight) days. 1 mL 11   omeprazole (PRILOSEC) 20 MG capsule Take 1 capsule (20 mg total) by mouth daily. 30 capsule 11   ONETOUCH VERIO test strip USE 1 STRIP TO TEST BLOOD SUGAR 8 TIMES DAILY 250 strip 3   topiramate (TOPAMAX) 50 MG tablet Take 1 tablet (50 mg total) by mouth 2 (two) times daily. Please call and make overdue appt for further refills. 2nd attempt 30 tablet 0   Vitamin D, Ergocalciferol, (DRISDOL) 1.25 MG (50000 UNIT) CAPS capsule Take 50,000 Units by mouth every 7 (seven) days.     Current Facility-Administered  Medications on File Prior to Visit  Medication Dose Route Frequency Provider Last Rate Last Admin   gadopentetate dimeglumine (MAGNEVIST) injection 20 mL  20 mL Intravenous Once PRN Dohmeier, Porfirio Mylar, MD        Past Surgical History:  Procedure Laterality Date   EYE SURGERY Right 05/30/2020   Vitrectomy, Dr. Luciana Axe   EYE SURGERY  04/2020   IR 3D INDEPENDENT WKST  07/16/2020   IR ANGIO INTRA EXTRACRAN SEL COM CAROTID INNOMINATE BILAT MOD SED  07/16/2020   IR ANGIO VERTEBRAL SEL SUBCLAVIAN INNOMINATE UNI L MOD SED  07/16/2020   IR ANGIO VERTEBRAL SEL VERTEBRAL UNI R MOD SED  07/16/2020   IR US GUIDE VASC ACCESS RIGHT  07/16/2020   REFRACTIVE SURGERY     x9   WISDOM TOOTH EXTRACTION      Allergies  Allergen Reactions   Fluticasone Shortness Of Breath and Cough    Tachycardia   Sulfa Antibiotics Nausea Only and Shortness Of Breath    Shortness of breath, increased heart rate; "happened quite a while ago"   Sulfites Itching, Swelling, Nausea And Vomiting, Cough and Palpitations    Tachycardia, flushing   Lactose Nausea Only, Diarrhea and Nausea And Vomiting    Upset stomach   Molds & Smuts Other (See Comments), Cough and Rash    Aggravate asthma   Tetracyclines & Related Other (See Comments)    increased intracranial pressure  Other Reaction(s): craninal pressure increase  Increased Intracranial pressure    increased intracranial pressure  increased intracranial pressure, increased intracranial pressure   Betula Alba Oil Rash   Lactose Intolerance (Gi) Diarrhea and Nausea And Vomiting    Upset stomach   Pollen Extract Cough   White [place]ch Cough    BP 114/67   Ht 5\' 11"  (1.803 m)   Wt 190 lb (86.2 kg)   BMI 26.50 kg/m      09/11/2020    9:45 AM 06/24/2021   10:44 AM 08/05/2021   10:00 AM 09/16/2021   10:08 AM  [facility] Adult Exercise  Frequency of aerobic exercise (# of days/week) 5 3 3 3   Average time in minutes 30 15 15 15   Frequency of strengthening  activities (# of days/week) 0 1 1 1         No data to display              Objective:  Physical Exam:  Gen: NAD, comfortable in exam room  Left foot/ankle: No swelling, ecchymoses.  Transverse arch collapse.  Callus under 5th MT head.  No other deformity. FROM digits  and ankle without pain TTP over plantar 5th MT head.  No dorsal or other tenderness. Negative metatarsal squeeze. NV intact distally.  Limited MSK u/s left foot:  No cortical irregularity of 5th metatarsal, edema overlying cortex.  5th MTP normal.     Assessment & Plan:  1. Left foot pain - ultrasound and exam reassuring.  Need to unload pressure over his 5th metatarsal head - felt cutout added here with a drop for this part of his foot.  Also added lateral heel wedge and lateral posting.  Felt more comfortable in the office.

## 2023-07-22 DIAGNOSIS — F411 Generalized anxiety disorder: Secondary | ICD-10-CM | POA: Diagnosis not present

## 2023-07-28 DIAGNOSIS — M25611 Stiffness of right shoulder, not elsewhere classified: Secondary | ICD-10-CM | POA: Diagnosis not present

## 2023-07-29 DIAGNOSIS — E1040 Type 1 diabetes mellitus with diabetic neuropathy, unspecified: Secondary | ICD-10-CM | POA: Diagnosis not present

## 2023-07-31 DIAGNOSIS — M25611 Stiffness of right shoulder, not elsewhere classified: Secondary | ICD-10-CM | POA: Diagnosis not present

## 2023-08-03 ENCOUNTER — Ambulatory Visit: Payer: BC Managed Care – PPO | Admitting: Family Medicine

## 2023-08-04 DIAGNOSIS — M25611 Stiffness of right shoulder, not elsewhere classified: Secondary | ICD-10-CM | POA: Diagnosis not present

## 2023-08-05 DIAGNOSIS — F411 Generalized anxiety disorder: Secondary | ICD-10-CM | POA: Diagnosis not present

## 2023-08-06 DIAGNOSIS — M25611 Stiffness of right shoulder, not elsewhere classified: Secondary | ICD-10-CM | POA: Diagnosis not present

## 2023-08-11 DIAGNOSIS — M25611 Stiffness of right shoulder, not elsewhere classified: Secondary | ICD-10-CM | POA: Diagnosis not present

## 2023-08-12 DIAGNOSIS — G932 Benign intracranial hypertension: Secondary | ICD-10-CM | POA: Diagnosis not present

## 2023-08-12 DIAGNOSIS — E113599 Type 2 diabetes mellitus with proliferative diabetic retinopathy without macular edema, unspecified eye: Secondary | ICD-10-CM | POA: Diagnosis not present

## 2023-08-12 DIAGNOSIS — Z9889 Other specified postprocedural states: Secondary | ICD-10-CM | POA: Diagnosis not present

## 2023-08-13 DIAGNOSIS — K8681 Exocrine pancreatic insufficiency: Secondary | ICD-10-CM | POA: Diagnosis not present

## 2023-08-14 DIAGNOSIS — M25611 Stiffness of right shoulder, not elsewhere classified: Secondary | ICD-10-CM | POA: Diagnosis not present

## 2023-08-18 ENCOUNTER — Other Ambulatory Visit: Payer: Self-pay

## 2023-08-18 DIAGNOSIS — R102 Pelvic and perineal pain: Secondary | ICD-10-CM | POA: Diagnosis not present

## 2023-08-18 DIAGNOSIS — M25611 Stiffness of right shoulder, not elsewhere classified: Secondary | ICD-10-CM | POA: Diagnosis not present

## 2023-08-18 DIAGNOSIS — M6281 Muscle weakness (generalized): Secondary | ICD-10-CM | POA: Diagnosis not present

## 2023-08-19 DIAGNOSIS — F411 Generalized anxiety disorder: Secondary | ICD-10-CM | POA: Diagnosis not present

## 2023-08-20 DIAGNOSIS — M25611 Stiffness of right shoulder, not elsewhere classified: Secondary | ICD-10-CM | POA: Diagnosis not present

## 2023-08-24 DIAGNOSIS — M25611 Stiffness of right shoulder, not elsewhere classified: Secondary | ICD-10-CM | POA: Diagnosis not present

## 2023-08-24 DIAGNOSIS — Z978 Presence of other specified devices: Secondary | ICD-10-CM | POA: Diagnosis not present

## 2023-08-24 DIAGNOSIS — E1065 Type 1 diabetes mellitus with hyperglycemia: Secondary | ICD-10-CM | POA: Diagnosis not present

## 2023-08-24 DIAGNOSIS — E1042 Type 1 diabetes mellitus with diabetic polyneuropathy: Secondary | ICD-10-CM | POA: Diagnosis not present

## 2023-08-24 DIAGNOSIS — Z9641 Presence of insulin pump (external) (internal): Secondary | ICD-10-CM | POA: Diagnosis not present

## 2023-08-26 DIAGNOSIS — M25611 Stiffness of right shoulder, not elsewhere classified: Secondary | ICD-10-CM | POA: Diagnosis not present

## 2023-08-26 DIAGNOSIS — F411 Generalized anxiety disorder: Secondary | ICD-10-CM | POA: Diagnosis not present

## 2023-09-02 DIAGNOSIS — M25611 Stiffness of right shoulder, not elsewhere classified: Secondary | ICD-10-CM | POA: Diagnosis not present

## 2023-09-02 DIAGNOSIS — F411 Generalized anxiety disorder: Secondary | ICD-10-CM | POA: Diagnosis not present

## 2023-09-04 DIAGNOSIS — M25611 Stiffness of right shoulder, not elsewhere classified: Secondary | ICD-10-CM | POA: Diagnosis not present

## 2023-09-07 DIAGNOSIS — Z7682 Awaiting organ transplant status: Secondary | ICD-10-CM | POA: Diagnosis not present

## 2023-09-07 DIAGNOSIS — I10 Essential (primary) hypertension: Secondary | ICD-10-CM | POA: Diagnosis not present

## 2023-09-08 ENCOUNTER — Other Ambulatory Visit (HOSPITAL_COMMUNITY): Payer: Self-pay

## 2023-09-08 DIAGNOSIS — M25611 Stiffness of right shoulder, not elsewhere classified: Secondary | ICD-10-CM | POA: Diagnosis not present

## 2023-09-09 ENCOUNTER — Other Ambulatory Visit: Payer: Self-pay

## 2023-09-09 DIAGNOSIS — F411 Generalized anxiety disorder: Secondary | ICD-10-CM | POA: Diagnosis not present

## 2023-09-09 DIAGNOSIS — E109 Type 1 diabetes mellitus without complications: Secondary | ICD-10-CM | POA: Diagnosis not present

## 2023-09-09 DIAGNOSIS — I10 Essential (primary) hypertension: Secondary | ICD-10-CM | POA: Diagnosis not present

## 2023-09-09 DIAGNOSIS — E559 Vitamin D deficiency, unspecified: Secondary | ICD-10-CM | POA: Diagnosis not present

## 2023-09-09 DIAGNOSIS — E782 Mixed hyperlipidemia: Secondary | ICD-10-CM | POA: Diagnosis not present

## 2023-09-10 DIAGNOSIS — E785 Hyperlipidemia, unspecified: Secondary | ICD-10-CM | POA: Diagnosis not present

## 2023-09-10 DIAGNOSIS — E103593 Type 1 diabetes mellitus with proliferative diabetic retinopathy without macular edema, bilateral: Secondary | ICD-10-CM | POA: Diagnosis not present

## 2023-09-10 DIAGNOSIS — R350 Frequency of micturition: Secondary | ICD-10-CM | POA: Diagnosis not present

## 2023-09-10 DIAGNOSIS — I10 Essential (primary) hypertension: Secondary | ICD-10-CM | POA: Diagnosis not present

## 2023-09-16 DIAGNOSIS — F411 Generalized anxiety disorder: Secondary | ICD-10-CM | POA: Diagnosis not present

## 2023-09-18 DIAGNOSIS — M25611 Stiffness of right shoulder, not elsewhere classified: Secondary | ICD-10-CM | POA: Diagnosis not present

## 2023-09-19 ENCOUNTER — Telehealth: Payer: BC Managed Care – PPO | Admitting: Family Medicine

## 2023-09-19 DIAGNOSIS — J069 Acute upper respiratory infection, unspecified: Secondary | ICD-10-CM

## 2023-09-19 MED ORDER — AZITHROMYCIN 250 MG PO TABS
ORAL_TABLET | ORAL | 0 refills | Status: AC
Start: 2023-09-19 — End: 2023-09-24

## 2023-09-19 NOTE — Patient Instructions (Signed)
[name], thank you for joining [name], PA-C for today's virtual visit.  While this provider is not your primary care provider (PCP), if your PCP is located in our provider database this encounter information will be shared with them immediately following your visit.   A [facility] MyChart account gives you access to today's visit and all your visits, tests, and labs performed at [facility] " click here if you don't have a [place] MyChart account or go to [email or web address]  Consent: (Patient) [name] provided verbal consent for this virtual visit at the beginning of the encounter.  Current Medications:  Current Outpatient Medications:    azithromycin (ZITHROMAX) 250 MG tablet, Take 2 tablets on day 1, then 1 tablet daily on days 2 through 5, Disp: 6 tablet, Rfl: 0   acetaZOLAMIDE ER (DIAMOX) 500 MG capsule, 625 mg., Disp: , Rfl:    albuterol (VENTOLIN HFA) 108 (90 Base) MCG/ACT inhaler, INHALE 2 PUFFS INTO THE LUNGS EVERY 4 HOURS AS NEEDED FOR WHEEZING OR SHORTNESS OF BREATH, Disp: 8.5 g, Rfl: 1   Albuterol-Budesonide (AIRSUPRA) 90-80 MCG/ACT AERO, Inhale 2 puffs into the lungs every 4 (four) hours as needed., Disp: 11 g, Rfl: 1   atorvastatin (LIPITOR) 20 MG tablet, Take 20 mg by mouth daily. , Disp: , Rfl: 0   BAQSIMI TWO PACK 3 MG/DOSE POWD, USE 1 SPRAY INTO THE NOSE AS DIRECTED, Disp: 2 each, Rfl: 3   Continuous Blood Gluc Receiver (DEXCOM G6 RECEIVER) DEVI, 1 Device by Does not apply route as directed., Disp: 1 each, Rfl: 2   Continuous Blood Gluc Sensor (DEXCOM G6 SENSOR) MISC, INJECT 1 SENSOR UNDER THE SKIN AS DIRECTED( CHANGE SENSOR EVERY 10 DAYS), Disp: 3 each, Rfl: 11   Continuous Blood Gluc Sensor (FREESTYLE LIBRE 2 SENSOR) MISC, USE EVERY 14 DAYS. USE IF DEXCOM FAILS, Disp: 2 each, Rfl: 5   Continuous Blood Gluc Transmit (DEXCOM G6 TRANSMITTER) MISC, USE AS DIRECTED WITH SENSOR UP TO 8 TIMES, Disp: 1 each, Rfl: 2    EPINEPHrine (AUVI-Q) 0.3 mg/0.3 mL IJ SOAJ injection, Use as directed for life-threatening allergic reaction., Disp: 2 each, Rfl: 2   fluticasone-salmeterol (WIXELA INHUB) 250-50 MCG/ACT AEPB, Inhale 1 puff into the lungs in the morning and at bedtime., Disp: 60 each, Rfl: 5   HUMALOG 100 UNIT/ML injection, Use 300 units in insulin pump every 48 hours, Disp: 120 mL, Rfl: 1   insulin aspart (NOVOLOG) 100 UNIT/ML injection, Use up to 300 units in pump every 48 hours, Disp: 120 mL, Rfl: 3   Insulin Lispro Junior KwikPen (HUMALOG JR) 100 UNIT/ML KwikPen, ADMINISTER UP TO 50 UNITS UNDER THE SKIN DAILY AS DIRECTED BY PRESCRIBER, Disp: 15 mL, Rfl: 11   Insulin Pen Needle (PEN NEEDLES) 32G X 4 MM MISC, Use to inject insulin 6x per day, Disp: 200 each, Rfl: 5   levocetirizine (XYZAL) 5 MG tablet, Take 1 tablet (5 mg total) by mouth daily as needed for allergies (Can take an extra dose during flare ups.)., Disp: 180 tablet, Rfl: 1   lipase/protease/amylase (CREON) 36000 UNITS CPEP capsule, 3 pills with every meal and 2 pills with every snack, Disp: 390 capsule, Rfl: 11   losartan (COZAAR) 50 MG tablet, TAKE 1 TABLET(50 MG) BY MOUTH DAILY, Disp: 90 tablet, Rfl: 1   montelukast (SINGULAIR) 10 MG tablet, Take 1 tablet (10 mg total) by mouth at bedtime., Disp: 30 tablet, Rfl: 5   Multiple Vitamin (MULTIVITAMIN WITH MINERALS) TABS tablet,  Take 1 tablet by mouth daily., Disp: , Rfl:    NUCALA 100 MG/ML SOAJ, Inject 1 mL (100 mg total) into the skin every 28 (twenty-eight) days., Disp: 1 mL, Rfl: 11   omeprazole (PRILOSEC) 20 MG capsule, Take 1 capsule (20 mg total) by mouth daily., Disp: 30 capsule, Rfl: 11   ONETOUCH VERIO test strip, USE 1 STRIP TO TEST BLOOD SUGAR 8 TIMES DAILY, Disp: 250 strip, Rfl: 3   topiramate (TOPAMAX) 50 MG tablet, Take 1 tablet (50 mg total) by mouth 2 (two) times daily. Please call and make overdue appt for further refills. 2nd attempt, Disp: 30 tablet, Rfl: 0   Vitamin D, Ergocalciferol,  (DRISDOL) 1.25 MG (50000 UNIT) CAPS capsule, Take 50,000 Units by mouth every 7 (seven) days., Disp: , Rfl:  No current facility-administered medications for this visit.  Facility-Administered Medications Ordered in Other Visits:    gadopentetate dimeglumine (MAGNEVIST) injection 20 mL, 20 mL, Intravenous, Once PRN, Dohmeier, Porfirio Mylar, MD   Medications ordered in this encounter:  Meds ordered this encounter  Medications   azithromycin (ZITHROMAX) 250 MG tablet    Sig: Take 2 tablets on day 1, then 1 tablet daily on days 2 through 5    Dispense:  6 tablet    Refill:  0     *If you need refills on other medications prior to your next appointment, please contact your pharmacy*  Follow-Up: Call back or seek an in-person evaluation if the symptoms worsen or if the condition fails to improve as anticipated.  [facility] [phone]  Other Instructions Upper Respiratory Infection, Adult An upper respiratory infection (URI) is a common viral infection of the nose, throat, and upper air passages that lead to the lungs. The most common type of URI is the common cold. URIs usually get better on their own, without medical treatment. What are the causes? A URI is caused by a virus. You may catch a virus by: Breathing in droplets from an infected person's cough or sneeze. Touching something that has been exposed to the virus (is contaminated) and then touching your mouth, nose, or eyes. What increases the risk? You are more likely to get a URI if: You are very young or very old. You have close contact with others, such as at work, school, or a health care facility. You smoke. You have long-term (chronic) heart or lung disease. You have a weakened disease-fighting system (immune system). You have nasal allergies or asthma. You are experiencing a lot of stress. You have poor nutrition. What are the signs or symptoms? A URI usually involves some of the following symptoms: Runny  or stuffy (congested) nose. Cough. Sneezing. Sore throat. Headache. Fatigue. Fever. Loss of appetite. Pain in your forehead, behind your eyes, and over your cheekbones (sinus pain). Muscle aches. Redness or irritation of the eyes. Pressure in the ears or face. How is this diagnosed? This condition may be diagnosed based on your medical history and symptoms, and a physical exam. Your health care provider may use a swab to take a mucus sample from your nose (nasal swab). This sample can be tested to determine what virus is causing the illness. How is this treated? URIs usually get better on their own within 7-10 days. Medicines cannot cure URIs, but your health care provider may recommend certain medicines to help relieve symptoms, such as: Over-the-counter cold medicines. Cough suppressants. Coughing is a type of defense against infection that helps to clear the respiratory system, so  take these medicines only as recommended by your health care provider. Fever-reducing medicines. Follow these instructions at home: Activity Rest as needed. If you have a fever, stay home from work or school until your fever is gone or until your health care provider says your URI cannot spread to other people (is no longer contagious). Your health care provider may have you wear a face mask to prevent your infection from spreading. Relieving symptoms Gargle with a mixture of salt and water 3-4 times a day or as needed. To make salt water, completely dissolve -1 tsp (3-6 g) of salt in 1 cup (237 mL) of warm water. Use a cool-mist humidifier to add moisture to the air. This can help you breathe more easily. Eating and drinking  Drink enough fluid to keep your urine pale yellow. Eat soups and other clear broths. General instructions  Take over-the-counter and prescription medicines only as told by your health care provider. These include cold medicines, fever reducers, and cough suppressants. Do not use  any products that contain nicotine or tobacco. These products include cigarettes, chewing tobacco, and vaping devices, such as e-cigarettes. If you need help quitting, ask your health care provider. Stay away from secondhand smoke. Stay up to date on all immunizations, including the yearly (annual) flu vaccine. Keep all follow-up visits. This is important. How to prevent the spread of infection to others URIs can be contagious. To prevent the infection from spreading: Wash your hands with soap and water for at least 20 seconds. If soap and water are not available, use hand sanitizer. Avoid touching your mouth, face, eyes, or nose. Cough or sneeze into a tissue or your sleeve or elbow instead of into your hand or into the air.  Contact a health care provider if: You are getting worse instead of better. You have a fever or chills. Your mucus is brown or red. You have yellow or brown discharge coming from your nose. You have pain in your face, especially when you bend forward. You have swollen neck glands. You have pain while swallowing. You have white areas in the back of your throat. Get help right away if: You have shortness of breath that gets worse. You have severe or persistent: Headache. Ear pain. Sinus pain. Chest pain. You have chronic lung disease along with any of the following: Making high-pitched whistling sounds when you breathe, most often when you breathe out (wheezing). Prolonged cough (more than 14 days). Coughing up blood. A change in your usual mucus. You have a stiff neck. You have changes in your: Vision. Hearing. Thinking. Mood. These symptoms may be an emergency. Get help right away. Call 911. Do not wait to see if the symptoms will go away. Do not drive yourself to the hospital. Summary An upper respiratory infection (URI) is a common infection of the nose, throat, and upper air passages that lead to the lungs. A URI is caused by a virus. URIs usually  get better on their own within 7-10 days. Medicines cannot cure URIs, but your health care provider may recommend certain medicines to help relieve symptoms. This information is not intended to replace advice given to you by your health care provider. Make sure you discuss any questions you have with your health care provider. Document Revised: 05/15/2021 Document Reviewed: 05/15/2021 Elsevier Patient Education  2024 Elsevier Inc.    If you have been instructed to have an in-person evaluation today at a local Urgent Care facility, please use the link below. It  will take you to a list of all of our available [facility], including address, phone number and hours of operation. Please do not delay care.  [facility]  If you or a family member do not have a primary care provider, use the link below to schedule a visit and establish care. When you choose a [facility] primary care physician or advanced practice provider, you gain a long-term partner in health. Find a Primary Care Provider  Learn more about [facility] in-office and virtual care options: [facility] - Get Care Now

## 2023-09-19 NOTE — Progress Notes (Signed)
Virtual Visit Consent   [name], you are scheduled for a virtual visit with a [facility] provider today. Just as with appointments in the office, your consent must be obtained to participate. Your consent will be active for this visit and any virtual visit you may have with one of our providers in the next 365 days. If you have a MyChart account, a copy of this consent can be sent to you electronically.  As this is a virtual visit, video technology does not allow for your provider to perform a traditional examination. This may limit your provider's ability to fully assess your condition. If your provider identifies any concerns that need to be evaluated in person or the need to arrange testing (such as labs, EKG, etc.), we will make arrangements to do so. Although advances in technology are sophisticated, we cannot ensure that it will always work on either your end or our end. If the connection with a video visit is poor, the visit may have to be switched to a telephone visit. With either a video or telephone visit, we are not always able to ensure that we have a secure connection.  By engaging in this virtual visit, you consent to the provision of healthcare and authorize for your insurance to be billed (if applicable) for the services provided during this visit. Depending on your insurance coverage, you may receive a charge related to this service.  I need to obtain your verbal consent now. Are you willing to proceed with your visit today? [name] has provided verbal consent on 09/19/2023 for a virtual visit (video or telephone). [name], [place]  Date: 09/19/2023 11:45 AM  Virtual Visit via Video Note   I, [name], connected with  [name]  ([id number], Jan 27, 1981) on 09/19/23 at 11:45 AM EST by a video-enabled telemedicine application and verified that I am speaking with the correct person using two identifiers.  Location: Patient: Virtual Visit  Location Patient: Home Provider: Virtual Visit Location Provider: Home Office   I discussed the limitations of evaluation and management by telemedicine and the availability of in person appointments. The patient expressed understanding and agreed to proceed.    History of Present Illness: [name] is a 42 y.o. who identifies as a male who was assigned male at birth, and is being seen today for Pt states he has a bit of congestion or upper respiratory thing he has been dealing with for about a week and a half.  Pt states he has some drainage and a sore throat. Pt states feels it in his lungs.  Pt states feels tired and has some shortness of breath. Pt states tested for covid and it was negative.  Pt states he is on the organ transplant list and he wants to get rid of what he has as soon as possible because he may be asked to have a transplant next week. Pt states taking only ibuprofen.  Pt states is allergic to flonase and is allergic to sulfa drugs and tetracycline. Pt states he has take a z-pack in the past with no reactions.   HPI: HPI  Problems:  Patient Active Problem List   Diagnosis Date Noted   Exocrine pancreatic insufficiency 02/27/2021   Hypoglycemia 01/30/2021   Nuclear sclerotic cataract of both eyes 09/12/2020   Ischemic optic neuritis, right 07/19/2020   Cerebral arteriosclerosis 07/19/2020   Photophobia, left eye 07/19/2020   Optic neuritis, posterior 07/10/2020   Color vision deficiency 07/10/2020  Vitreomacular adhesion of left eye 07/03/2020   H/O visual field defect 06/14/2020   Stable treated proliferative diabetic retinopathy of left eye with macular edema determined by examination associated with type 1 diabetes mellitus (HCC) 05/28/2020   Benign intracranial hypertension 05/28/2020   Stable treated proliferative diabetic retinopathy of right eye with macular edema determined by examination associated with type 1 diabetes mellitus (HCC) 05/28/2020    Vitreous hemorrhage of right eye (HCC) 05/28/2020   Pain in joint, shoulder region 04/17/2020   Finger numbness 04/17/2020   Moderate persistent asthma without complication 12/27/2019   Contusion of back, right, initial encounter 04/12/2019   Left shoulder pain 04/12/2019   Acute pain of left wrist 04/12/2019   Pseudotumor cerebri syndrome 02/16/2019   Class 1 obesity with serious comorbidity and body mass index (BMI) of 32.0 to 32.9 in adult 02/16/2019   Idiopathic normal pressure hydrocephalus (HCC) 12/15/2018   Severe persistent asthma with acute exacerbation 08/31/2018   Seasonal and perennial allergic rhinitis 08/31/2018   Mild intermittent asthma without complication 07/12/2018   Type 1 diabetes mellitus with other specified complication (HCC) 06/09/2018   Sore throat 04/26/2018   Moderate persistent asthma with acute exacerbation 04/26/2018   Other allergic rhinitis 04/26/2018   Gastroesophageal reflux disease 04/26/2018   Food allergy 04/26/2018   Pseudotumor cerebri 03/25/2018   Morbid obesity with BMI of 40.0-44.9, adult (HCC) 03/25/2018   Altitude sickness, subsequent encounter 03/25/2018   Encounter for medication management 03/25/2018   Snoring 03/25/2018   Increased intracranial pressure 03/20/2018   Low back pain 03/10/2018   Cervicalgia of occipito-atlanto-axial region 08/31/2017   Type 1 diabetes mellitus with complication (HCC) 08/31/2017   Calcified lymph nodes 06/04/2017   Insulin pump in place 05/28/2017   Neuropathy 05/28/2017   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, with proliferative retinopathy (HCC) 05/28/2017   Other symptoms and signs involving the nervous system 05/28/2017   Nightmares REM-sleep type 05/28/2017   Proliferative diabetic retinopathy, right eye (HCC) 04/11/2017   Benign paroxysmal positional vertigo 03/29/2017   Costochondritis 03/06/2017   Acute upper respiratory infection 03/06/2017   Combined hyperlipidemia 02/11/2016    Metatarsalgia of right foot 02/07/2016   Lactose intolerance 01/07/2016   Inappropriate sinus tachycardia (HCC) 06/29/2015   Thoracic back pain 11/02/2013   Type 1 diabetes mellitus not at goal [facility])    Goiter    Hypercholesterolemia    ADHD (attention deficit hyperactivity disorder)    Fatigue    Tachycardia    Autonomic neuropathy associated with type 1 diabetes mellitus (HCC)    Hypertension    Uncontrolled DM with microalbuminuria or microproteinuria    Hypoglycemia associated with diabetes (HCC)    DIABETES MELLITUS, I 12/24/2006   ATTENTION DEFICIT, W/HYPERACTIVITY 12/24/2006    Allergies:  Allergies  Allergen Reactions   Fluticasone Shortness Of Breath and Cough    Tachycardia   Sulfa Antibiotics Nausea Only and Shortness Of Breath    Shortness of breath, increased heart rate; "happened quite a while ago"   Sulfites Itching, Swelling, Nausea And Vomiting, Cough and Palpitations    Tachycardia, flushing   Lactose Nausea Only, Diarrhea and Nausea And Vomiting    Upset stomach   Molds & Smuts Other (See Comments), Cough and Rash    Aggravate asthma   Tetracyclines & Related Other (See Comments)    increased intracranial pressure  Other Reaction(s): craninal pressure increase  Increased Intracranial pressure    increased intracranial pressure  increased intracranial pressure, increased intracranial  pressure   Betula [place] Oil Rash   Lactose Intolerance (Gi) Diarrhea and Nausea And Vomiting    Upset stomach   Pollen Extract Cough   [place] Cough   Medications:  Current Outpatient Medications:    azithromycin (ZITHROMAX) 250 MG tablet, Take 2 tablets on day 1, then 1 tablet daily on days 2 through 5, Disp: 6 tablet, Rfl: 0   acetaZOLAMIDE ER (DIAMOX) 500 MG capsule, 625 mg., Disp: , Rfl:    albuterol (VENTOLIN HFA) 108 (90 Base) MCG/ACT inhaler, INHALE 2 PUFFS INTO THE LUNGS EVERY 4 HOURS AS NEEDED FOR WHEEZING OR SHORTNESS OF BREATH, Disp: 8.5 g, Rfl: 1    Albuterol-Budesonide (AIRSUPRA) 90-80 MCG/ACT AERO, Inhale 2 puffs into the lungs every 4 (four) hours as needed., Disp: 11 g, Rfl: 1   atorvastatin (LIPITOR) 20 MG tablet, Take 20 mg by mouth daily. , Disp: , Rfl: 0   BAQSIMI TWO PACK 3 MG/DOSE POWD, USE 1 SPRAY INTO THE NOSE AS DIRECTED, Disp: 2 each, Rfl: 3   Continuous Blood Gluc Receiver (DEXCOM G6 RECEIVER) DEVI, 1 Device by Does not apply route as directed., Disp: 1 each, Rfl: 2   Continuous Blood Gluc Sensor (DEXCOM G6 SENSOR) MISC, INJECT 1 SENSOR UNDER THE SKIN AS DIRECTED( CHANGE SENSOR EVERY 10 DAYS), Disp: 3 each, Rfl: 11   Continuous Blood Gluc Sensor (FREESTYLE LIBRE 2 SENSOR) MISC, USE EVERY 14 DAYS. USE IF DEXCOM FAILS, Disp: 2 each, Rfl: 5   Continuous Blood Gluc Transmit (DEXCOM G6 TRANSMITTER) MISC, USE AS DIRECTED WITH SENSOR UP TO 8 TIMES, Disp: 1 each, Rfl: 2   EPINEPHrine (AUVI-Q) 0.3 mg/0.3 mL IJ SOAJ injection, Use as directed for life-threatening allergic reaction., Disp: 2 each, Rfl: 2   fluticasone-salmeterol (WIXELA INHUB) 250-50 MCG/ACT AEPB, Inhale 1 puff into the lungs in the morning and at bedtime., Disp: 60 each, Rfl: 5   HUMALOG 100 UNIT/ML injection, Use 300 units in insulin pump every 48 hours, Disp: 120 mL, Rfl: 1   insulin aspart (NOVOLOG) 100 UNIT/ML injection, Use up to 300 units in pump every 48 hours, Disp: 120 mL, Rfl: 3   Insulin Lispro Junior KwikPen (HUMALOG JR) 100 UNIT/ML KwikPen, ADMINISTER UP TO 50 UNITS UNDER THE SKIN DAILY AS DIRECTED BY PRESCRIBER, Disp: 15 mL, Rfl: 11   Insulin Pen Needle (PEN NEEDLES) 32G X 4 MM MISC, Use to inject insulin 6x per day, Disp: 200 each, Rfl: 5   levocetirizine (XYZAL) 5 MG tablet, Take 1 tablet (5 mg total) by mouth daily as needed for allergies (Can take an extra dose during flare ups.)., Disp: 180 tablet, Rfl: 1   lipase/protease/amylase (CREON) 36000 UNITS CPEP capsule, 3 pills with every meal and 2 pills with every snack, Disp: 390 capsule, Rfl: 11   losartan  (COZAAR) 50 MG tablet, TAKE 1 TABLET(50 MG) BY MOUTH DAILY, Disp: 90 tablet, Rfl: 1   montelukast (SINGULAIR) 10 MG tablet, Take 1 tablet (10 mg total) by mouth at bedtime., Disp: 30 tablet, Rfl: 5   Multiple Vitamin (MULTIVITAMIN WITH MINERALS) TABS tablet, Take 1 tablet by mouth daily., Disp: , Rfl:    NUCALA 100 MG/ML SOAJ, Inject 1 mL (100 mg total) into the skin every 28 (twenty-eight) days., Disp: 1 mL, Rfl: 11   omeprazole (PRILOSEC) 20 MG capsule, Take 1 capsule (20 mg total) by mouth daily., Disp: 30 capsule, Rfl: 11   ONETOUCH VERIO test strip, USE 1 STRIP TO TEST BLOOD SUGAR 8 TIMES DAILY, Disp:  250 strip, Rfl: 3   topiramate (TOPAMAX) 50 MG tablet, Take 1 tablet (50 mg total) by mouth 2 (two) times daily. Please call and make overdue appt for further refills. 2nd attempt, Disp: 30 tablet, Rfl: 0   Vitamin D, Ergocalciferol, (DRISDOL) 1.25 MG (50000 UNIT) CAPS capsule, Take 50,000 Units by mouth every 7 (seven) days., Disp: , Rfl:  No current facility-administered medications for this visit.  Facility-Administered Medications Ordered in Other Visits:    gadopentetate dimeglumine (MAGNEVIST) injection 20 mL, 20 mL, Intravenous, Once PRN, Dohmeier, Porfirio Mylar, MD  Observations/Objective: Patient is well-developed, well-nourished in no acute distress.  Resting comfortably at home.  Head is normocephalic, atraumatic.  No labored breathing.  Speech is clear and coherent with logical content.  Patient is alert and oriented at baseline.    Assessment and Plan: 1. Acute upper respiratory infection - azithromycin (ZITHROMAX) 250 MG tablet; Take 2 tablets on day 1, then 1 tablet daily on days 2 through 5  Dispense: 6 tablet; Refill: 0  -Pt unable to take flonase due to allergy and does not want to try a steroid because of his diabetes  -Advised Pt to follow up at an urgent care or with PCP for worsening symptoms.   Follow Up Instructions: I discussed the assessment and treatment plan with  the patient. The patient was provided an opportunity to ask questions and all were answered. The patient agreed with the plan and demonstrated an understanding of the instructions.  A copy of instructions were sent to the patient via MyChart unless otherwise noted below.    The patient was advised to call back or seek an in-person evaluation if the symptoms worsen or if the condition fails to improve as anticipated.    Joseph Pandy, PA-C

## 2023-09-30 DIAGNOSIS — F411 Generalized anxiety disorder: Secondary | ICD-10-CM | POA: Diagnosis not present

## 2023-10-07 DIAGNOSIS — F411 Generalized anxiety disorder: Secondary | ICD-10-CM | POA: Diagnosis not present

## 2023-10-09 DIAGNOSIS — Z794 Long term (current) use of insulin: Secondary | ICD-10-CM | POA: Diagnosis not present

## 2023-10-09 DIAGNOSIS — E1065 Type 1 diabetes mellitus with hyperglycemia: Secondary | ICD-10-CM | POA: Diagnosis not present

## 2023-10-09 DIAGNOSIS — E1069 Type 1 diabetes mellitus with other specified complication: Secondary | ICD-10-CM | POA: Diagnosis not present

## 2023-10-09 DIAGNOSIS — Z9641 Presence of insulin pump (external) (internal): Secondary | ICD-10-CM | POA: Diagnosis not present

## 2023-10-12 ENCOUNTER — Ambulatory Visit (INDEPENDENT_AMBULATORY_CARE_PROVIDER_SITE_OTHER): Payer: BC Managed Care – PPO | Admitting: Family Medicine

## 2023-10-12 ENCOUNTER — Encounter: Payer: Self-pay | Admitting: Family Medicine

## 2023-10-12 VITALS — BP 122/72 | Ht 72.0 in | Wt 192.0 lb

## 2023-10-12 DIAGNOSIS — M7501 Adhesive capsulitis of right shoulder: Secondary | ICD-10-CM | POA: Diagnosis not present

## 2023-10-12 NOTE — Patient Instructions (Signed)
Start physical therapy for right frozen shoulder. We will get notes from Dr. Veda Canning office - likely will go ahead with an intraarticular toradol injection with ultrasound guidance but need to see these first. Call/message me Wednesday if I haven't contact you yet to check on this.

## 2023-10-12 NOTE — Progress Notes (Addendum)
PCP: Dani Gobble, PA-C  Subjective:   HPI: Patient is a 42 y.o. male here for right shoulder pain.  Patient continues with right shoulder pain. Sustained comminuted nondisplaced fracture right proximal humerus on 3/30 - treated conservatively. Still struggling to regain motion of this shoulder. Has done physical therapy and doing home exercises still. Told he likely has frozen shoulder. Soreness still in joints especially knees, ankles, hands in addition to his right shoulder.  Past Medical History:  Diagnosis Date   ADHD (attention deficit hyperactivity disorder)    Anxiety    Asthma    Autonomic neuropathy due to diabetes (HCC)    Chronic headaches    Diabetes (HCC)    Fatigue    GERD (gastroesophageal reflux disease)    Goiter    High blood pressure    High cholesterol    Hypercholesterolemia    Hypertension    Hypoglycemia associated with diabetes (HCC)    Malnutrition (HCC)    Obesity    Sleep apnea    mild, no cpap   Tachycardia    Type 1 diabetes mellitus not at goal [facility])    Uncontrolled DM with microalbuminuria or microproteinuria     Current Outpatient Medications on File Prior to Visit  Medication Sig Dispense Refill   acetaZOLAMIDE ER (DIAMOX) 500 MG capsule 625 mg.     albuterol (VENTOLIN HFA) 108 (90 Base) MCG/ACT inhaler INHALE 2 PUFFS INTO THE LUNGS EVERY 4 HOURS AS NEEDED FOR WHEEZING OR SHORTNESS OF BREATH 8.5 g 1   Albuterol-Budesonide (AIRSUPRA) 90-80 MCG/ACT AERO Inhale 2 puffs into the lungs every 4 (four) hours as needed. 11 g 1   atorvastatin (LIPITOR) 20 MG tablet Take 20 mg by mouth daily.   0   BAQSIMI TWO PACK 3 MG/DOSE POWD USE 1 SPRAY INTO THE NOSE AS DIRECTED 2 each 3   Continuous Blood Gluc Receiver (DEXCOM G6 RECEIVER) DEVI 1 Device by Does not apply route as directed. 1 each 2   Continuous Blood Gluc Sensor (DEXCOM G6 SENSOR) MISC INJECT 1 SENSOR UNDER THE SKIN AS DIRECTED( CHANGE SENSOR EVERY 10 DAYS) 3 each 11   Continuous  Blood Gluc Sensor (FREESTYLE LIBRE 2 SENSOR) MISC USE EVERY 14 DAYS. USE IF DEXCOM FAILS 2 each 5   Continuous Blood Gluc Transmit (DEXCOM G6 TRANSMITTER) MISC USE AS DIRECTED WITH SENSOR UP TO 8 TIMES 1 each 2   EPINEPHrine (AUVI-Q) 0.3 mg/0.3 mL IJ SOAJ injection Use as directed for life-threatening allergic reaction. 2 each 2   fluticasone-salmeterol (WIXELA INHUB) 250-50 MCG/ACT AEPB Inhale 1 puff into the lungs in the morning and at bedtime. 60 each 5   HUMALOG 100 UNIT/ML injection Use 300 units in insulin pump every 48 hours 120 mL 1   insulin aspart (NOVOLOG) 100 UNIT/ML injection Use up to 300 units in pump every 48 hours 120 mL 3   Insulin Lispro Junior KwikPen (HUMALOG JR) 100 UNIT/ML KwikPen ADMINISTER UP TO 50 UNITS UNDER THE SKIN DAILY AS DIRECTED BY PRESCRIBER 15 mL 11   Insulin Pen Needle (PEN NEEDLES) 32G X 4 MM MISC Use to inject insulin 6x per day 200 each 5   levocetirizine (XYZAL) 5 MG tablet Take 1 tablet (5 mg total) by mouth daily as needed for allergies (Can take an extra dose during flare ups.). 180 tablet 1   lipase/protease/amylase (CREON) 36000 UNITS CPEP capsule 3 pills with every meal and 2 pills with every snack 390 capsule 11   losartan (COZAAR)  50 MG tablet TAKE 1 TABLET(50 MG) BY MOUTH DAILY 90 tablet 1   montelukast (SINGULAIR) 10 MG tablet Take 1 tablet (10 mg total) by mouth at bedtime. 30 tablet 5   Multiple Vitamin (MULTIVITAMIN WITH MINERALS) TABS tablet Take 1 tablet by mouth daily.     NUCALA 100 MG/ML SOAJ Inject 1 mL (100 mg total) into the skin every 28 (twenty-eight) days. 1 mL 11   omeprazole (PRILOSEC) 20 MG capsule Take 1 capsule (20 mg total) by mouth daily. 30 capsule 11   ONETOUCH VERIO test strip USE 1 STRIP TO TEST BLOOD SUGAR 8 TIMES DAILY 250 strip 3   topiramate (TOPAMAX) 50 MG tablet Take 1 tablet (50 mg total) by mouth 2 (two) times daily. Please call and make overdue appt for further refills. 2nd attempt 30 tablet 0   Vitamin D,  Ergocalciferol, (DRISDOL) 1.25 MG (50000 UNIT) CAPS capsule Take 50,000 Units by mouth every 7 (seven) days.     Current Facility-Administered Medications on File Prior to Visit  Medication Dose Route Frequency Provider Last Rate Last Admin   gadopentetate dimeglumine (MAGNEVIST) injection 20 mL  20 mL Intravenous Once PRN Dohmeier, Porfirio Mylar, MD        Past Surgical History:  Procedure Laterality Date   EYE SURGERY Right 05/30/2020   Vitrectomy, Dr. Luciana Axe   EYE SURGERY  04/2020   IR 3D INDEPENDENT WKST  07/16/2020   IR ANGIO INTRA EXTRACRAN SEL COM CAROTID INNOMINATE BILAT MOD SED  07/16/2020   IR ANGIO VERTEBRAL SEL SUBCLAVIAN INNOMINATE UNI L MOD SED  07/16/2020   IR ANGIO VERTEBRAL SEL VERTEBRAL UNI R MOD SED  07/16/2020   IR US GUIDE VASC ACCESS RIGHT  07/16/2020   REFRACTIVE SURGERY     x9   WISDOM TOOTH EXTRACTION      Allergies  Allergen Reactions   Fluticasone Shortness Of Breath and Cough    Tachycardia   Sulfa Antibiotics Nausea Only and Shortness Of Breath    Shortness of breath, increased heart rate; "happened quite a while ago"   Sulfites Itching, Swelling, Nausea And Vomiting, Cough and Palpitations    Tachycardia, flushing   Lactose Nausea Only, Diarrhea and Nausea And Vomiting    Upset stomach   Molds & Smuts Other (See Comments), Cough and Rash    Aggravate asthma   Tetracyclines & Related Other (See Comments)    increased intracranial pressure  Other Reaction(s): craninal pressure increase  Increased Intracranial pressure    increased intracranial pressure  increased intracranial pressure, increased intracranial pressure   Betula Alba Oil Rash   Lactose Intolerance (Gi) Diarrhea and Nausea And Vomiting    Upset stomach   Pollen Extract Cough   White [place]ch Cough    BP 122/72   Ht 6' (1.829 m)   Wt 192 lb (87.1 kg)   BMI 26.04 kg/m      09/11/2020    9:45 AM 06/24/2021   10:44 AM 08/05/2021   10:00 AM 09/16/2021   10:08 AM  [facility]  Frequency of aerobic exercise (# of days/week) 5 3 3 3   Average time in minutes 30 15 15 15   Frequency of strengthening activities (# of days/week) 0 1 1 1         No data to display              Objective:  Physical Exam:  Gen: NAD, comfortable in exam room  Right shoulder: No swelling, ecchymoses.  No gross deformity. No TTP AC joint, biceps tendon Full IR.  ER limited to 20 degrees.  Abduction and flexion actively to 90 degrees, passively 110 degrees. Strength 5-/5 with empty can and 5/5 resisted internal/external rotation. NV intact distally.   Assessment & Plan:  1. Right shoulder pain - about 9 months out from his comminuted proximal humerus fracture.  He has adhesive capsulitis.  Will obtain records from Dr. Ave Filter to see what plan was going forward.  Referral for additional PT sent today.  Consider intraarticular injection (toradol vs steroid with his diabetes), hydrodilatation.    Addendum:  records reviewed - he did have a manipulation and capsular release of his right shoulder a few months ago for adhesive capsulitis.  Follow ups with ortho are now as needed.  Will have him get x-rays to ensure no evidence of AVN - hasn't had x-rays in about 6 months and type of fracture is at risk for that.  If normal will proceed with intraarticular injection.  Consider hydrodilatation if that doesn't help.

## 2023-10-13 ENCOUNTER — Other Ambulatory Visit (HOSPITAL_COMMUNITY): Payer: Self-pay | Admitting: Interventional Radiology

## 2023-10-13 DIAGNOSIS — I771 Stricture of artery: Secondary | ICD-10-CM

## 2023-10-13 NOTE — Addendum Note (Signed)
Addended by: Rutha Bouchard E on: 10/13/2023 04:33 PM   Modules accepted: Orders

## 2023-10-14 DIAGNOSIS — F411 Generalized anxiety disorder: Secondary | ICD-10-CM | POA: Diagnosis not present

## 2023-10-16 ENCOUNTER — Ambulatory Visit
Admission: RE | Admit: 2023-10-16 | Discharge: 2023-10-16 | Disposition: A | Discharge status: Home / Self Care | Payer: BC Managed Care – PPO | Source: Ambulatory Visit | Attending: Family Medicine | Admitting: Family Medicine

## 2023-10-16 DIAGNOSIS — M7501 Adhesive capsulitis of right shoulder: Secondary | ICD-10-CM

## 2023-10-26 DIAGNOSIS — M7501 Adhesive capsulitis of right shoulder: Secondary | ICD-10-CM | POA: Diagnosis not present

## 2023-11-02 ENCOUNTER — Other Ambulatory Visit: Payer: Self-pay

## 2023-11-03 ENCOUNTER — Ambulatory Visit: Payer: BC Managed Care – PPO | Admitting: Allergy and Immunology

## 2023-11-03 DIAGNOSIS — Z7682 Awaiting organ transplant status: Secondary | ICD-10-CM | POA: Diagnosis not present

## 2023-11-03 DIAGNOSIS — I10 Essential (primary) hypertension: Secondary | ICD-10-CM | POA: Diagnosis not present

## 2023-11-03 DIAGNOSIS — J309 Allergic rhinitis, unspecified: Secondary | ICD-10-CM

## 2023-11-04 ENCOUNTER — Ambulatory Visit
Admission: RE | Admit: 2023-11-04 | Discharge: 2023-11-04 | Disposition: A | Discharge status: Home / Self Care | Payer: BC Managed Care – PPO | Source: Ambulatory Visit | Attending: Interventional Radiology | Admitting: Interventional Radiology

## 2023-11-04 ENCOUNTER — Ambulatory Visit
Admission: RE | Admit: 2023-11-04 | Discharge: 2023-11-04 | Disposition: A | Discharge status: Home / Self Care | Payer: BC Managed Care – PPO | Source: Ambulatory Visit | Attending: Family Medicine | Admitting: Family Medicine

## 2023-11-04 ENCOUNTER — Other Ambulatory Visit (HOSPITAL_COMMUNITY): Payer: Self-pay

## 2023-11-04 ENCOUNTER — Other Ambulatory Visit: Payer: Self-pay

## 2023-11-04 DIAGNOSIS — I6612 Occlusion and stenosis of left anterior cerebral artery: Secondary | ICD-10-CM | POA: Diagnosis not present

## 2023-11-04 DIAGNOSIS — I6522 Occlusion and stenosis of left carotid artery: Secondary | ICD-10-CM | POA: Diagnosis not present

## 2023-11-04 DIAGNOSIS — M25511 Pain in right shoulder: Secondary | ICD-10-CM | POA: Diagnosis not present

## 2023-11-04 DIAGNOSIS — I771 Stricture of artery: Secondary | ICD-10-CM

## 2023-11-04 MED ORDER — IOPAMIDOL (ISOVUE-370) INJECTION 76%
200.0000 mL | Freq: Once | INTRAVENOUS | Status: AC | PRN
  Administered 2023-11-04: 75 mL via INTRAVENOUS

## 2023-11-05 ENCOUNTER — Other Ambulatory Visit: Payer: Self-pay

## 2023-11-05 ENCOUNTER — Encounter: Payer: Self-pay | Admitting: Family Medicine

## 2023-11-05 ENCOUNTER — Ambulatory Visit: Payer: BC Managed Care – PPO | Admitting: Family Medicine

## 2023-11-05 VITALS — BP 102/60 | HR 91 | Temp 98.5°F | Resp 16 | Ht 69.88 in | Wt 198.2 lb

## 2023-11-05 DIAGNOSIS — J3089 Other allergic rhinitis: Secondary | ICD-10-CM | POA: Diagnosis not present

## 2023-11-05 DIAGNOSIS — J4550 Severe persistent asthma, uncomplicated: Secondary | ICD-10-CM | POA: Diagnosis not present

## 2023-11-05 DIAGNOSIS — J302 Other seasonal allergic rhinitis: Secondary | ICD-10-CM | POA: Diagnosis not present

## 2023-11-05 DIAGNOSIS — L7682 Other postprocedural complications of skin and subcutaneous tissue: Secondary | ICD-10-CM | POA: Insufficient documentation

## 2023-11-05 MED ORDER — MONTELUKAST SODIUM 10 MG PO TABS: 10.0000 mg | ORAL_TABLET | Freq: Every day | ORAL | 5 refills | Status: DC

## 2023-11-05 MED ORDER — AIRSUPRA 90-80 MCG/ACT IN AERO: 2.0000 | INHALATION_SPRAY | RESPIRATORY_TRACT | 1 refills | Status: AC | PRN

## 2023-11-05 MED ORDER — FLUTICASONE-SALMETEROL 250-50 MCG/ACT IN AEPB: 1.0000 | INHALATION_SPRAY | Freq: Two times a day (BID) | RESPIRATORY_TRACT | 5 refills | Status: AC

## 2023-11-05 NOTE — Progress Notes (Signed)
 Per chart patient discontinued Nucala  approximately 3 months ago due to bleeding from injection site for 1 week after dose per provider note. Provider documented as treatment failure and did not mention alternative biologic as patient has failed numerous already. Disenrolling.

## 2023-11-05 NOTE — Patient Instructions (Addendum)
 Asthma Restart montelukast 10 mg once a day to prevent cough or wheeze Continue Airsupra 2 puffs if needed.  Do not use more than 12 puffs in a 24-hour time span For asthma flare, begin Wixela 250-1 puff twice a day and call the clinic  Allergic rhinitis. Continue an antihistamine once a day if needed for runny nose or itch Continue Flonase 1-2 sprays in each nostril once a day if needed for stuffy nose Consider saline nasal rinses as needed for nasal symptoms. Use this before any medicated nasal sprays for best result Continue allergen avoidance measures directed toward tree pollen and mold as listed below  Bleeding at insulin pump insertion site A lab has been ordered to check your blood count including platelets. We will call you when the result becomes available  Call the clinic if this treatment plan is not working well for you.  Follow up in 3 months or sooner if needed.  Reducing Pollen Exposure The American Academy of Allergy, Asthma and Immunology suggests the following steps to reduce your exposure to pollen during allergy seasons. Do not hang sheets or clothing out to dry; pollen may collect on these items. Do not mow lawns or spend time around freshly cut grass; mowing stirs up pollen. Keep windows closed at night.  Keep car windows closed while driving. Minimize morning activities outdoors, a time when pollen counts are usually at their highest. Stay indoors as much as possible when pollen counts or humidity is high and on windy days when pollen tends to remain in the air longer. Use air conditioning when possible.  Many air conditioners have filters that trap the pollen spores. Use a HEPA room air filter to remove pollen form the indoor air you breathe.  Control of Mold Allergen Mold and fungi can grow on a variety of surfaces provided certain temperature and moisture conditions exist.  Outdoor molds grow on plants, decaying vegetation and soil.  The major outdoor mold,  Alternaria and Cladosporium, are found in very high numbers during hot and dry conditions.  Generally, a late Summer - Fall peak is seen for common outdoor fungal spores.  Rain will temporarily lower outdoor mold spore count, but counts rise rapidly when the rainy period ends.  The most important indoor molds are Aspergillus and Penicillium.  Dark, humid and poorly ventilated basements are ideal sites for mold growth.  The next most common sites of mold growth are the bathroom and the kitchen.  Outdoor [employer] Use air conditioning and keep windows closed Avoid exposure to decaying vegetation. Avoid leaf raking. Avoid grain handling. Consider wearing a face mask if working in moldy areas.  Indoor Mold Control Maintain humidity below 50%. Clean washable surfaces with 5% bleach solution. Remove sources e.g. Contaminated carpets.

## 2023-11-05 NOTE — Progress Notes (Signed)
 [address] [place] [address] Dept: [phone]  FOLLOW UP NOTE  Patient ID: [name], male    DOB: Dec 22, 1980  Age: 43 y.o. MRN: [id number] Date of Office Visit: 11/05/2023  Assessment  Chief Complaint: Follow-up  HPI [name] is a 43 year old male who presents to the clinic for follow-up visit.  He was last seen in this clinic on 04/21/2023 by Dr. Kozlow for evaluation of asthma and allergic rhinitis.    At today's visit, he reports his asthma has been moderately well controlled with occasional shortness of breath with activity. He denies cough or wheeze with activity or rest and denies shortness of breath at rest. He reports that, when feeling short of breath, he rests until the feeling passes. He is not currently using albuterol , levalbuterol , AirSupra , or Wixela. He stopped receiving Nucala  injections about 3 months ago. He reports that for 1 week following his Nucala  injection he would bleed from the insulin  pump site. He has preciously failed omalizumab , benralizumab, dupilumab , tezepelumab , and now mepolizumab .  Allergic rhinitis is reported as well-controlled with occasional clear rhinorrhea, nasal congestion, and postnasal drainage mainly occurring over the winter and in the fall.  He continues an over-the-counter antihistamine as needed and is not currently using Flonase  or nasal saline rinses.  His last environmental allergy skin testing was on 06/10/2017 and was positive to tree pollen and mold mix 2.  Of note, he reports that his Diamox  dose has recently been reduced to 500 mg daily.  His current medications are listed in the chart.   Drug Allergies:  Allergies  Allergen Reactions   Fluticasone  Shortness Of Breath and Cough    Tachycardia   Sulfa Antibiotics Nausea Only and Shortness Of Breath    Shortness of breath, increased heart rate; happened quite a while ago   Sulfites Itching, Swelling, Nausea And Vomiting, Cough and Palpitations     Tachycardia, flushing   Lactose Nausea Only, Diarrhea and Nausea And Vomiting    Upset stomach   Molds & Smuts Other (See Comments), Cough and Rash    Aggravate asthma   Tetracyclines & Related Other (See Comments)    increased intracranial pressure  Other Reaction(s): craninal pressure increase  Increased Intracranial pressure    increased intracranial pressure  increased intracranial pressure, increased intracranial pressure   Betula Alba Oil Rash   Lactose Intolerance (Gi) Diarrhea and Nausea And Vomiting    Upset stomach   Pollen Extract Cough   White Birch Cough    Physical Exam: BP 102/60   Pulse 91   Temp 98.5 F (36.9 C) (Temporal)   Resp 16   Ht 5' 9.88 (1.775 m)   Wt 198 lb 3.2 oz (89.9 kg)   SpO2 98%   BMI 28.53 kg/m    Physical Exam Vitals reviewed.  Constitutional:      Appearance: Normal appearance.  HENT:     Head: Normocephalic and atraumatic.     Right Ear: Tympanic membrane normal.     Left Ear: Tympanic membrane normal.     Nose:     Comments: Bilateral nares normal.  Pharynx normal.  Ears normal.  Eyes normal.    Mouth/Throat:     Pharynx: Oropharynx is clear.  Eyes:     Conjunctiva/sclera: Conjunctivae normal.  Cardiovascular:     Rate and Rhythm: Normal rate and regular rhythm.     Heart sounds: Normal heart sounds. No murmur heard. Pulmonary:     Effort: Pulmonary effort  is normal.     Breath sounds: Normal breath sounds.     Comments: Lungs clear to auscultation Musculoskeletal:        General: Normal range of motion.     Cervical back: Normal range of motion and neck supple.  Skin:    General: Skin is warm and dry.     Comments: No bleeding or bruising  Neurological:     Mental Status: He is alert and oriented to person, place, and time.  Psychiatric:        Mood and Affect: Mood normal.        Behavior: Behavior normal.        Thought Content: Thought content normal.        Judgment: Judgment normal.      Diagnostics: FVC 4.50 which is 84% of predicted value, FEV1 3.78 which is 91% of predicted value.  Spirometry indicates normal ventilatory function.  Assessment and Plan: 1. Asthma, severe persistent, well-controlled   2. Seasonal and perennial allergic rhinitis   3. Bleeding at insertion site     Meds ordered this encounter  Medications   Albuterol -Budesonide  (AIRSUPRA ) 90-80 MCG/ACT AERO    Sig: Inhale 2 puffs into the lungs every 4 (four) hours as needed.    Dispense:  10.7 g    Refill:  1    Bin:[id number] [id number] HME:[id number] PI:[id number]   montelukast  (SINGULAIR ) 10 MG tablet    Sig: Take 1 tablet (10 mg total) by mouth at bedtime.    Dispense:  30 tablet    Refill:  5   fluticasone -salmeterol (WIXELA INHUB) 250-50 MCG/ACT AEPB    Sig: Inhale 1 puff into the lungs in the morning and at bedtime.    Dispense:  60 each    Refill:  5    Patient Instructions  Asthma Restart montelukast  10 mg once a day to prevent cough or wheeze Continue Airsupra  2 puffs if needed.  Do not use more than 12 puffs in a 24-hour time span For asthma flare, begin Wixela 250-1 puff twice a day and call the clinic  Allergic rhinitis. Continue an antihistamine once a day if needed for runny nose or itch Continue Flonase  1-2 sprays in each nostril once a day if needed for stuffy nose Consider saline nasal rinses as needed for nasal symptoms. Use this before any medicated nasal sprays for best result Continue allergen avoidance measures directed toward tree pollen and mold as listed below  Bleeding at insulin  pump insertion site A lab has been ordered to check your blood count including platelets. We will call you when the result becomes available  Call the clinic if this treatment plan is not working well for you.  Follow up in 3 months or sooner if needed.  Return in about 3 months (around 02/03/2024).    Thank you for the opportunity to care for this patient.  Please do not hesitate to  contact me with questions.  Arlean Mutter, FNP [facility] 

## 2023-11-09 ENCOUNTER — Encounter: Payer: Self-pay | Admitting: Family Medicine

## 2023-11-09 ENCOUNTER — Telehealth (HOSPITAL_COMMUNITY): Payer: Self-pay

## 2023-11-09 NOTE — Telephone Encounter (Signed)
Pt agreed to f/u in 1 year with a cta head/neck. AB

## 2023-11-10 DIAGNOSIS — H43822 Vitreomacular adhesion, left eye: Secondary | ICD-10-CM | POA: Diagnosis not present

## 2023-11-10 DIAGNOSIS — H2513 Age-related nuclear cataract, bilateral: Secondary | ICD-10-CM | POA: Diagnosis not present

## 2023-11-10 DIAGNOSIS — G932 Benign intracranial hypertension: Secondary | ICD-10-CM | POA: Diagnosis not present

## 2023-11-10 DIAGNOSIS — E103553 Type 1 diabetes mellitus with stable proliferative diabetic retinopathy, bilateral: Secondary | ICD-10-CM | POA: Diagnosis not present

## 2023-11-11 DIAGNOSIS — E1040 Type 1 diabetes mellitus with diabetic neuropathy, unspecified: Secondary | ICD-10-CM | POA: Diagnosis not present

## 2023-11-12 ENCOUNTER — Ambulatory Visit: Payer: BC Managed Care – PPO | Admitting: Family Medicine

## 2023-12-02 ENCOUNTER — Ambulatory Visit: Payer: BC Managed Care – PPO | Admitting: Family Medicine

## 2023-12-02 ENCOUNTER — Other Ambulatory Visit: Payer: Self-pay

## 2023-12-02 VITALS — BP 110/60 | Ht 71.0 in | Wt 192.0 lb

## 2023-12-02 DIAGNOSIS — M7501 Adhesive capsulitis of right shoulder: Secondary | ICD-10-CM

## 2023-12-02 MED ORDER — KETOROLAC TROMETHAMINE 60 MG/2ML IM SOLN
60.0000 mg | Freq: Once | INTRAMUSCULAR | Status: AC
  Administered 2023-12-02: 60 mg via INTRA_ARTICULAR

## 2023-12-02 MED ORDER — METHYLPREDNISOLONE ACETATE 40 MG/ML IJ SUSP: 40.0000 mg | Freq: Once | INTRAMUSCULAR | Status: DC

## 2023-12-03 NOTE — Progress Notes (Signed)
 Patient returned today for right shoulder intraarticular injection with toradol .  After informed written consent timeout was performed, patient was lying on left side on exam table. Right shoulder was prepped with alcohol swab and utilizing posterior approach with ultrasound guidance, patient's right glenohumeral space was injected with 4:2 lidocaine : toradol  (total 60mg ). Patient tolerated the procedure well without immediate complications.

## 2023-12-10 DIAGNOSIS — Z01818 Encounter for other preprocedural examination: Secondary | ICD-10-CM | POA: Diagnosis not present

## 2023-12-15 DIAGNOSIS — H0015 Chalazion left lower eyelid: Secondary | ICD-10-CM | POA: Diagnosis not present

## 2023-12-15 DIAGNOSIS — H02886 Meibomian gland dysfunction of left eye, unspecified eyelid: Secondary | ICD-10-CM | POA: Diagnosis not present

## 2023-12-15 DIAGNOSIS — H02883 Meibomian gland dysfunction of right eye, unspecified eyelid: Secondary | ICD-10-CM | POA: Diagnosis not present

## 2023-12-30 ENCOUNTER — Encounter: Payer: Self-pay | Admitting: Family Medicine

## 2023-12-30 ENCOUNTER — Ambulatory Visit (INDEPENDENT_AMBULATORY_CARE_PROVIDER_SITE_OTHER): Payer: BC Managed Care – PPO | Admitting: Family Medicine

## 2023-12-30 VITALS — BP 120/82 | Ht 71.0 in | Wt 192.0 lb

## 2023-12-30 DIAGNOSIS — G8929 Other chronic pain: Secondary | ICD-10-CM | POA: Diagnosis not present

## 2023-12-30 DIAGNOSIS — M25562 Pain in left knee: Secondary | ICD-10-CM

## 2023-12-30 DIAGNOSIS — M7501 Adhesive capsulitis of right shoulder: Secondary | ICD-10-CM | POA: Diagnosis not present

## 2023-12-30 NOTE — Patient Instructions (Addendum)
 Continue the exercises but focus on only getting about 10 degrees back with your stretching, not pushing too far. Add VMO strengthening exercise for both knees. Topical voltaren gel, heat for your shoulder. Consider hydrodilatation of your shoulder but would do this once the pain has improved and it's not progressing otherwise it will likely just freeze up again. Follow up with me in 2 month.

## 2023-12-30 NOTE — Progress Notes (Signed)
 PCP: Dani Gobble, PA-C  Subjective:   HPI: Patient is a 43 y.o. male here for right shoulder pain.  10/12/23: Patient continues with right shoulder pain. Sustained comminuted nondisplaced fracture right proximal humerus on 3/30 - treated conservatively. Still struggling to regain motion of this shoulder. Has done physical therapy and doing home exercises still. Told he likely has frozen shoulder. Soreness still in joints especially knees, ankles, hands in addition to his right shoulder.  12/30/23: Patient reports right shoulder  intraarticular toradol injection given last visit did not provide benefit. Motion if anything feels like it has worsened a little bit. He is aggressively doing motion exercises - to the point he will feel pops and get much more motion but gets more stiff day afterwards. Also with pain anterior knees more on the left. Has continued to walk on treadmill quite a bit. No knee injuries recently.  Past Medical History:  Diagnosis Date   ADHD (attention deficit hyperactivity disorder)    Anxiety    Asthma    Autonomic neuropathy due to diabetes (HCC)    Chronic headaches    Diabetes (HCC)    Fatigue    GERD (gastroesophageal reflux disease)    Goiter    High blood pressure    High cholesterol    Hypercholesterolemia    Hypertension    Hypoglycemia associated with diabetes (HCC)    Malnutrition (HCC)    Obesity    Sleep apnea    mild, no cpap   Tachycardia    Type 1 diabetes mellitus not at goal [facility])    Uncontrolled DM with microalbuminuria or microproteinuria     Current Outpatient Medications on File Prior to Visit  Medication Sig Dispense Refill   acetaZOLAMIDE ER (DIAMOX) 500 MG capsule 625 mg.     albuterol (VENTOLIN HFA) 108 (90 Base) MCG/ACT inhaler INHALE 2 PUFFS INTO THE LUNGS EVERY 4 HOURS AS NEEDED FOR WHEEZING OR SHORTNESS OF BREATH (Patient not taking: Reported on 11/05/2023) 8.5 g 1   Albuterol-Budesonide (AIRSUPRA) 90-80  MCG/ACT AERO Inhale 2 puffs into the lungs every 4 (four) hours as needed. 10.7 g 1   atorvastatin (LIPITOR) 20 MG tablet Take 20 mg by mouth daily.   0   BAQSIMI TWO PACK 3 MG/DOSE POWD USE 1 SPRAY INTO THE NOSE AS DIRECTED 2 each 3   Continuous Blood Gluc Receiver (DEXCOM G6 RECEIVER) DEVI 1 Device by Does not apply route as directed. 1 each 2   Continuous Blood Gluc Sensor (DEXCOM G6 SENSOR) MISC INJECT 1 SENSOR UNDER THE SKIN AS DIRECTED( CHANGE SENSOR EVERY 10 DAYS) 3 each 11   Continuous Blood Gluc Sensor (FREESTYLE LIBRE 2 SENSOR) MISC USE EVERY 14 DAYS. USE IF DEXCOM FAILS 2 each 5   Continuous Blood Gluc Transmit (DEXCOM G6 TRANSMITTER) MISC USE AS DIRECTED WITH SENSOR UP TO 8 TIMES 1 each 2   EPINEPHrine (AUVI-Q) 0.3 mg/0.3 mL IJ SOAJ injection Use as directed for life-threatening allergic reaction. 2 each 2   fluticasone-salmeterol (WIXELA INHUB) 250-50 MCG/ACT AEPB Inhale 1 puff into the lungs in the morning and at bedtime. 60 each 5   HUMALOG 100 UNIT/ML injection Use 300 units in insulin pump every 48 hours 120 mL 1   insulin aspart (NOVOLOG) 100 UNIT/ML injection Use up to 300 units in pump every 48 hours 120 mL 3   Insulin Lispro Junior KwikPen (HUMALOG JR) 100 UNIT/ML KwikPen ADMINISTER UP TO 50 UNITS UNDER THE SKIN DAILY AS DIRECTED BY PRESCRIBER  15 mL 11   Insulin Pen Needle (PEN NEEDLES) 32G X 4 MM MISC Use to inject insulin 6x per day 200 each 5   levocetirizine (XYZAL) 5 MG tablet Take 1 tablet (5 mg total) by mouth daily as needed for allergies (Can take an extra dose during flare ups.). 180 tablet 1   lipase/protease/amylase (CREON) 36000 UNITS CPEP capsule 3 pills with every meal and 2 pills with every snack 390 capsule 11   losartan (COZAAR) 50 MG tablet TAKE 1 TABLET(50 MG) BY MOUTH DAILY 90 tablet 1   montelukast (SINGULAIR) 10 MG tablet Take 1 tablet (10 mg total) by mouth at bedtime. 30 tablet 5   Multiple Vitamin (MULTIVITAMIN WITH MINERALS) TABS tablet Take 1 tablet  by mouth daily.     NUCALA 100 MG/ML SOAJ Inject 1 mL (100 mg total) into the skin every 28 (twenty-eight) days. (Patient not taking: Reported on 11/05/2023) 1 mL 11   omeprazole (PRILOSEC) 20 MG capsule Take 1 capsule (20 mg total) by mouth daily. 30 capsule 11   ONETOUCH VERIO test strip USE 1 STRIP TO TEST BLOOD SUGAR 8 TIMES DAILY 250 strip 3   topiramate (TOPAMAX) 50 MG tablet Take 1 tablet (50 mg total) by mouth 2 (two) times daily. Please call and make overdue appt for further refills. 2nd attempt 30 tablet 0   Vitamin D, Ergocalciferol, (DRISDOL) 1.25 MG (50000 UNIT) CAPS capsule Take 50,000 Units by mouth every 7 (seven) days.     Current Facility-Administered Medications on File Prior to Visit  Medication Dose Route Frequency Provider Last Rate Last Admin   gadopentetate dimeglumine (MAGNEVIST) injection 20 mL  20 mL Intravenous Once PRN Dohmeier, Porfirio Mylar, MD        Past Surgical History:  Procedure Laterality Date   EYE SURGERY Right 05/30/2020   Vitrectomy, Dr. Luciana Axe   EYE SURGERY  04/2020   IR 3D INDEPENDENT WKST  07/16/2020   IR ANGIO INTRA EXTRACRAN SEL COM CAROTID INNOMINATE BILAT MOD SED  07/16/2020   IR ANGIO VERTEBRAL SEL SUBCLAVIAN INNOMINATE UNI L MOD SED  07/16/2020   IR ANGIO VERTEBRAL SEL VERTEBRAL UNI R MOD SED  07/16/2020   IR US GUIDE VASC ACCESS RIGHT  07/16/2020   REFRACTIVE SURGERY     x9   WISDOM TOOTH EXTRACTION      Allergies  Allergen Reactions   Fluticasone Shortness Of Breath and Cough    Tachycardia   Sulfa Antibiotics Nausea Only and Shortness Of Breath    Shortness of breath, increased heart rate; "happened quite a while ago"   Sulfites Itching, Swelling, Nausea And Vomiting, Cough and Palpitations    Tachycardia, flushing   Lactose Nausea Only, Diarrhea and Nausea And Vomiting    Upset stomach   Molds & Smuts Other (See Comments), Cough and Rash    Aggravate asthma   Tetracyclines & Related Other (See Comments)    increased intracranial  pressure  Other Reaction(s): craninal pressure increase  Increased Intracranial pressure    increased intracranial pressure  increased intracranial pressure, increased intracranial pressure   Betula Alba Oil Rash   Lactose Intolerance (Gi) Diarrhea and Nausea And Vomiting    Upset stomach   Pollen Extract Cough   [place] Cough    BP 120/82   Ht 5\' 11"  (1.803 m)   Wt 192 lb (87.1 kg)   BMI 26.78 kg/m      09/11/2020    9:45 AM 06/24/2021   10:44 AM 08/05/2021  10:00 AM 09/16/2021   10:08 AM  [facility] Adult Exercise  Frequency of aerobic exercise (# of days/week) 5 3 3 3   Average time in minutes 30 15 15 15   Frequency of strengthening activities (# of days/week) 0 1 1 1         No data to display              Objective:  Physical Exam:  Gen: NAD, comfortable in exam room  Right shoulder: No swelling, ecchymoses.  No gross deformity. No TTP anteriorly. Full IR.  ER limited to 10 degrees.  Abduction and flexion to 80 degrees actively and passively. NV intact distally.   Left knee: No gross deformity, ecchymoses, swelling.  VMO atrophy. Mild TTP joint lines. FROM with normal strength. Negative ant/post drawers. Negative valgus/varus testing. Negative lachman.  Negative mcmurrays, apleys.  NV intact distally.  Assessment & Plan:  1. Right shoulder pain - 1 year out now from comminuted proximal humerus fracture, with adhesive capsulitis.  He had manipulation and capsular release last year as well.  Repeat radiographs showed healing of the prior fracture.  Think he's being a little too aggressive with his motion exercises which may be prolonging his pain and causing worsening of his motion - goal is slow continued progress of his motion.  Unfortunately no benefit with glenohumeral injection at all.  I don't think a suprascapular nerve block at this point would provide much benefit.  I would like his motion to stabilize at least before we try a  hydrodilatation.  Continue motion as we discussed.  Voltaren gel, heat, tylenol.  2. Knee pain - add VMO exercises.  Patellar shift on exam and he continues to have some joint line tenderness, general muscle atrophy.

## 2024-01-01 ENCOUNTER — Telehealth: Payer: Self-pay | Admitting: Dietician

## 2024-01-01 DIAGNOSIS — K8681 Exocrine pancreatic insufficiency: Secondary | ICD-10-CM | POA: Diagnosis not present

## 2024-01-01 DIAGNOSIS — E103593 Type 1 diabetes mellitus with proliferative diabetic retinopathy without macular edema, bilateral: Secondary | ICD-10-CM | POA: Diagnosis not present

## 2024-01-01 DIAGNOSIS — Z1331 Encounter for screening for depression: Secondary | ICD-10-CM | POA: Diagnosis not present

## 2024-01-01 NOTE — Telephone Encounter (Signed)
 Returned patient call. He has had 2 failed Dexcom placements in the last couple of days and just realized that he does not have a Dexcom prescription.  He has reached out to his endocrinologist for this. He will call Dexcom for a replacement sensor but needs a CGM more urgently. Dexcom G7 - lot [id number], exp 09/25/2024 left at [address] for pick up.  Oran Rein, RD, LDN, CDCES, DipACLM

## 2024-01-01 NOTE — Telephone Encounter (Signed)
 Returned patient call. He had 2 failed dexcom placements and now noted that he has no dexcom prescription.  He has reached out to his endocrinologist for this. He will call Dexcom to get a replacement sensor. Provided patient a Dexcom G6 sensor [id number] Exp 06/26/2024.  Oran Rein, RD, LDN, CDCES, DipACLM

## 2024-01-04 DIAGNOSIS — Z01818 Encounter for other preprocedural examination: Secondary | ICD-10-CM | POA: Diagnosis not present

## 2024-01-04 DIAGNOSIS — Z794 Long term (current) use of insulin: Secondary | ICD-10-CM | POA: Diagnosis not present

## 2024-01-04 DIAGNOSIS — E1065 Type 1 diabetes mellitus with hyperglycemia: Secondary | ICD-10-CM | POA: Diagnosis not present

## 2024-01-04 DIAGNOSIS — E1069 Type 1 diabetes mellitus with other specified complication: Secondary | ICD-10-CM | POA: Diagnosis not present

## 2024-01-04 DIAGNOSIS — Z9641 Presence of insulin pump (external) (internal): Secondary | ICD-10-CM | POA: Diagnosis not present

## 2024-01-11 DIAGNOSIS — E1040 Type 1 diabetes mellitus with diabetic neuropathy, unspecified: Secondary | ICD-10-CM | POA: Diagnosis not present

## 2024-01-12 DIAGNOSIS — H02886 Meibomian gland dysfunction of left eye, unspecified eyelid: Secondary | ICD-10-CM | POA: Diagnosis not present

## 2024-01-12 DIAGNOSIS — H02883 Meibomian gland dysfunction of right eye, unspecified eyelid: Secondary | ICD-10-CM | POA: Diagnosis not present

## 2024-01-12 DIAGNOSIS — E103593 Type 1 diabetes mellitus with proliferative diabetic retinopathy without macular edema, bilateral: Secondary | ICD-10-CM | POA: Diagnosis not present

## 2024-01-12 DIAGNOSIS — H2513 Age-related nuclear cataract, bilateral: Secondary | ICD-10-CM | POA: Diagnosis not present

## 2024-01-13 DIAGNOSIS — I10 Essential (primary) hypertension: Secondary | ICD-10-CM | POA: Diagnosis not present

## 2024-01-13 DIAGNOSIS — E109 Type 1 diabetes mellitus without complications: Secondary | ICD-10-CM | POA: Diagnosis not present

## 2024-01-13 DIAGNOSIS — Z1330 Encounter for screening examination for mental health and behavioral disorders, unspecified: Secondary | ICD-10-CM | POA: Diagnosis not present

## 2024-02-02 DIAGNOSIS — Z01818 Encounter for other preprocedural examination: Secondary | ICD-10-CM | POA: Diagnosis not present

## 2024-02-02 DIAGNOSIS — E109 Type 1 diabetes mellitus without complications: Secondary | ICD-10-CM | POA: Diagnosis not present

## 2024-02-02 DIAGNOSIS — E559 Vitamin D deficiency, unspecified: Secondary | ICD-10-CM | POA: Diagnosis not present

## 2024-02-02 DIAGNOSIS — E782 Mixed hyperlipidemia: Secondary | ICD-10-CM | POA: Diagnosis not present

## 2024-02-03 NOTE — Progress Notes (Deleted)
   [address] [place] [address] Dept: [phone]  FOLLOW UP NOTE  Patient ID: [name], male    DOB: 05-Sep-1981  Age: 43 y.o. MRN: [id number] Date of Office Visit: 02/04/2024  Assessment  Chief Complaint: No chief complaint on file.  HPI [name] is a 43 year old male who presents to the clinic for follow-up visit.  He was last seen in this clinic on 11/05/2023 by Thermon Leyland, FNP, for evaluation of asthma, allergic rhinitis, and bleeding at the insulin pump site with use of biologic therapy for asthma control. His last environmental allergy skin testing was on 06/10/2017 and was positive to tree pollen and mold mix 2.  Discussed the use of AI scribe software for clinical note transcription with the patient, who gave verbal consent to proceed.  History of Present Illness      Drug Allergies:  Allergies  Allergen Reactions   Fluticasone Shortness Of Breath and Cough    Tachycardia   Sulfa Antibiotics Nausea Only and Shortness Of Breath    Shortness of breath, increased heart rate; "happened quite a while ago"   Sulfites Itching, Swelling, Nausea And Vomiting, Cough and Palpitations    Tachycardia, flushing   Lactose Nausea Only, Diarrhea and Nausea And Vomiting    Upset stomach   Molds & Smuts Other (See Comments), Cough and Rash    Aggravate asthma   Tetracyclines & Related Other (See Comments)    increased intracranial pressure  Other Reaction(s): craninal pressure increase  Increased Intracranial pressure    increased intracranial pressure  increased intracranial pressure, increased intracranial pressure   Betula Alba Oil Rash   Lactose Intolerance (Gi) Diarrhea and Nausea And Vomiting    Upset stomach   Pollen Extract Cough   White [place] Cough    Physical Exam: There were no vitals taken for this visit.   Physical Exam  Diagnostics:    Assessment and Plan: No diagnosis found.  No orders of the defined types were placed  in this encounter.   There are no Patient Instructions on file for this visit.  No follow-ups on file.    Thank you for the opportunity to care for this patient.  Please do not hesitate to contact me with questions.  Thermon Leyland, FNP [facility]

## 2024-02-04 ENCOUNTER — Ambulatory Visit: Payer: BC Managed Care – PPO | Admitting: Family Medicine

## 2024-02-04 DIAGNOSIS — J309 Allergic rhinitis, unspecified: Secondary | ICD-10-CM

## 2024-02-10 DIAGNOSIS — F411 Generalized anxiety disorder: Secondary | ICD-10-CM | POA: Diagnosis not present

## 2024-02-17 DIAGNOSIS — F411 Generalized anxiety disorder: Secondary | ICD-10-CM | POA: Diagnosis not present

## 2024-02-22 DIAGNOSIS — R300 Dysuria: Secondary | ICD-10-CM | POA: Diagnosis not present

## 2024-02-22 DIAGNOSIS — E1043 Type 1 diabetes mellitus with diabetic autonomic (poly)neuropathy: Secondary | ICD-10-CM | POA: Diagnosis not present

## 2024-02-22 DIAGNOSIS — G932 Benign intracranial hypertension: Secondary | ICD-10-CM | POA: Diagnosis not present

## 2024-02-22 DIAGNOSIS — I10 Essential (primary) hypertension: Secondary | ICD-10-CM | POA: Diagnosis not present

## 2024-02-25 DIAGNOSIS — F411 Generalized anxiety disorder: Secondary | ICD-10-CM | POA: Diagnosis not present

## 2024-02-29 DIAGNOSIS — Z01818 Encounter for other preprocedural examination: Secondary | ICD-10-CM | POA: Diagnosis not present

## 2024-03-03 DIAGNOSIS — F411 Generalized anxiety disorder: Secondary | ICD-10-CM | POA: Diagnosis not present

## 2024-03-03 NOTE — Progress Notes (Signed)
 [address] [place] [address] Dept: [phone]  FOLLOW UP NOTE  Patient ID: [name], male    DOB: 21-Jul-1981  Age: 43 y.o. MRN: [id number] Date of Office Visit: 03/04/2024  Assessment  Chief Complaint: Asthma (3 mth f/u - Alright) and Seasonal and Perennial Allergic Rhinitis (3 mth f/u - Okay)  HPI [name] is a 43 year old male who presents to the clinic for follow-up visit.  He was last seen in this clinic on 11/05/2023 by Marinus Sic, FNP, for evaluation of asthma, allergic rhinitis, and bleeding at the insulin  pump site with use of biologic therapy for asthma control.  He remains on the transplant list for pancreas and kidney  At today's visit, he reports his asthma has been well-controlled with no shortness of breath, cough, or wheeze with activity or rest.  He is not currently taking montelukast  and has not used Airsupra  or Wixela since his last visit to this clinic.  He does report occasional chest tightness when he spends a lot of time outside without a mask during high pollen season.  Of note, his endocrinologist suggested stopping montelukast  in an effort to stop bleeding at the insulin  pump sites.  He reports that he still has some bleeding, however, this has improved significantly.  Allergic rhinitis is reported as moderately well-controlled with occasional postnasal drainage noted mainly after spending a lot of time outside.  He denies rhinorrhea, nasal congestion, and sneezing at this time.  He does report that he did experience some of the symptoms when the pollen was high in March and April he continues an antihistamine as needed and occasionally uses Flonase  nasal spray.  He is not currently using Flonase  nasal spray.  He reports no difference in his cast. His last environmental allergy skin testing was on 06/10/2017 and was positive to tree pollen and mold mix 2.  Reflux is reported as well-controlled with no symptoms including heartburn or  vomiting.  He is not currently taking any medication to control reflux.  His current medications are listed in the chart.  Drug Allergies:  Allergies  Allergen Reactions   Fluticasone  Shortness Of Breath and Cough    Tachycardia   Sulfa Antibiotics Nausea Only and Shortness Of Breath    Shortness of breath, increased heart rate; "happened quite a while ago"   Sulfites Itching, Swelling, Nausea And Vomiting, Cough and Palpitations    Tachycardia, flushing   Lactose Nausea Only, Diarrhea and Nausea And Vomiting    Upset stomach   Molds & Smuts Other (See Comments), Cough and Rash    Aggravate asthma   Tetracyclines & Related Other (See Comments)    increased intracranial pressure  Other Reaction(s): craninal pressure increase  Increased Intracranial pressure    increased intracranial pressure  increased intracranial pressure, increased intracranial pressure   Betula Alba Oil Rash   Lactose Intolerance (Gi) Diarrhea and Nausea And Vomiting    Upset stomach   Pollen Extract Cough   White [place] Cough    Physical Exam: BP 100/68 (BP Location: Left Arm, Patient Position: Sitting, Cuff Size: Normal)   Pulse 84   Temp 98.2 F (36.8 C) (Temporal)   Resp 16   Ht 6' (1.829 m)   Wt 193 lb 8 oz (87.8 kg)   SpO2 98%   BMI 26.24 kg/m    Physical Exam Vitals reviewed.  Constitutional:      Appearance: Normal appearance.  HENT:     Head: Normocephalic and atraumatic.  Right Ear: Tympanic membrane normal.     Left Ear: Tympanic membrane normal.     Nose:     Comments: Bilateral nares normal. Pharynx normal. Ears normal. Eyes normal.    Mouth/Throat:     Pharynx: Oropharynx is clear.  Eyes:     Conjunctiva/sclera: Conjunctivae normal.  Cardiovascular:     Rate and Rhythm: Normal rate and regular rhythm.     Heart sounds: Normal heart sounds. No murmur heard. Pulmonary:     Effort: Pulmonary effort is normal.     Breath sounds: Normal breath sounds.     Comments:  Lungs clear to auscultation Musculoskeletal:        General: Normal range of motion.     Cervical back: Normal range of motion and neck supple.  Skin:    General: Skin is warm and dry.  Neurological:     Mental Status: He is alert and oriented to person, place, and time.  Psychiatric:        Mood and Affect: Mood normal.        Behavior: Behavior normal.        Thought Content: Thought content normal.        Judgment: Judgment normal.     Diagnostics: FVC 4.72 which is 89% of predicted value, FEV1 3.89 which is 91% of predicted value.  Spirometry indicates normal ventilatory function.  Assessment and Plan: 1. Mild intermittent asthma without complication   2. Seasonal and perennial allergic rhinitis   3. Bleeding at insertion site     Patient Instructions  Asthma Continue Airsupra  2 puffs if needed.  Do not use more than 12 puffs in a 24-hour time span For asthma flare, begin Wixela 250-1 puff twice a day and call the clinic  Allergic rhinitis. Continue an antihistamine once a day if needed for runny nose or itch Continue Flonase  1-2 sprays in each nostril once a day if needed for stuffy nose Consider saline nasal rinses as needed for nasal symptoms. Use this before any medicated nasal sprays for best result Continue allergen avoidance measures directed toward tree pollen and mold as listed below  Bleeding at insulin  pump insertion site Continue to follow-up with your endocrinologist as recommended  Call the clinic if this treatment plan is not working well for you.  Follow up in 6 months or sooner if needed.   Return in about 6 months (around 09/04/2024), or if symptoms worsen or fail to improve.    Thank you for the opportunity to care for this patient.  Please do not hesitate to contact me with questions.  Marinus Sic, FNP [facility] 

## 2024-03-03 NOTE — Patient Instructions (Addendum)
 Asthma Continue Airsupra  2 puffs if needed.  Do not use more than 12 puffs in a 24-hour time span For asthma flare, begin Wixela 250-1 puff twice a day and call the clinic  Allergic rhinitis. Continue an antihistamine once a day if needed for runny nose or itch Continue Flonase  1-2 sprays in each nostril once a day if needed for stuffy nose Consider saline nasal rinses as needed for nasal symptoms. Use this before any medicated nasal sprays for best result Continue allergen avoidance measures directed toward tree pollen and mold as listed below  Bleeding at insulin  pump insertion site Continue to follow-up with your endocrinologist as recommended  Call the clinic if this treatment plan is not working well for you.  Follow up in 6 months or sooner if needed.  Reducing Pollen Exposure The American Academy of Allergy, Asthma and [employer] suggests the following steps to reduce your exposure to pollen during allergy seasons. Do not hang sheets or clothing out to dry; pollen may collect on these items. Do not mow lawns or spend time around freshly cut grass; mowing stirs up pollen. Keep windows closed at night.  Keep car windows closed while driving. Minimize morning activities outdoors, a time when pollen counts are usually at their highest. Stay indoors as much as possible when pollen counts or humidity is high and on windy days when pollen tends to remain in the air longer. Use air conditioning when possible.  Many air conditioners have filters that trap the pollen spores. Use a HEPA room air filter to remove pollen form the indoor air you breathe.  Control of Mold Allergen Mold and fungi can grow on a variety of surfaces provided certain temperature and moisture conditions exist.  Outdoor molds grow on plants, decaying vegetation and soil.  The major outdoor mold, Alternaria and Cladosporium, are found in very high numbers during hot and dry conditions.  Generally, a late Summer - Fall  peak is seen for common outdoor fungal spores.  Rain will temporarily lower outdoor mold spore count, but counts rise rapidly when the rainy period ends.  The most important indoor molds are Aspergillus and Penicillium.  Dark, humid and poorly ventilated basements are ideal sites for mold growth.  The next most common sites of mold growth are the bathroom and the kitchen.  Outdoor [employer] Use air conditioning and keep windows closed Avoid exposure to decaying vegetation. Avoid leaf raking. Avoid grain handling. Consider wearing a face mask if working in moldy areas.  Indoor Mold Control Maintain humidity below 50%. Clean washable surfaces with 5% bleach solution. Remove sources e.g. Contaminated carpets.

## 2024-03-04 ENCOUNTER — Encounter: Payer: Self-pay | Admitting: Family Medicine

## 2024-03-04 ENCOUNTER — Other Ambulatory Visit: Payer: Self-pay

## 2024-03-04 ENCOUNTER — Ambulatory Visit (INDEPENDENT_AMBULATORY_CARE_PROVIDER_SITE_OTHER): Admitting: Family Medicine

## 2024-03-04 VITALS — BP 100/68 | HR 84 | Temp 98.2°F | Resp 16 | Ht 72.0 in | Wt 193.5 lb

## 2024-03-04 DIAGNOSIS — J4520 Mild intermittent asthma, uncomplicated: Secondary | ICD-10-CM | POA: Diagnosis not present

## 2024-03-04 DIAGNOSIS — J3089 Other allergic rhinitis: Secondary | ICD-10-CM | POA: Diagnosis not present

## 2024-03-04 DIAGNOSIS — L7682 Other postprocedural complications of skin and subcutaneous tissue: Secondary | ICD-10-CM | POA: Diagnosis not present

## 2024-03-04 DIAGNOSIS — J302 Other seasonal allergic rhinitis: Secondary | ICD-10-CM

## 2024-03-11 DIAGNOSIS — F411 Generalized anxiety disorder: Secondary | ICD-10-CM | POA: Diagnosis not present

## 2024-03-16 DIAGNOSIS — E1043 Type 1 diabetes mellitus with diabetic autonomic (poly)neuropathy: Secondary | ICD-10-CM | POA: Diagnosis not present

## 2024-03-16 DIAGNOSIS — E109 Type 1 diabetes mellitus without complications: Secondary | ICD-10-CM | POA: Diagnosis not present

## 2024-03-16 DIAGNOSIS — Z0000 Encounter for general adult medical examination without abnormal findings: Secondary | ICD-10-CM | POA: Diagnosis not present

## 2024-03-16 DIAGNOSIS — G912 (Idiopathic) normal pressure hydrocephalus: Secondary | ICD-10-CM | POA: Diagnosis not present

## 2024-03-16 DIAGNOSIS — J4520 Mild intermittent asthma, uncomplicated: Secondary | ICD-10-CM | POA: Diagnosis not present

## 2024-03-16 DIAGNOSIS — E785 Hyperlipidemia, unspecified: Secondary | ICD-10-CM | POA: Diagnosis not present

## 2024-03-16 DIAGNOSIS — Z6827 Body mass index (BMI) 27.0-27.9, adult: Secondary | ICD-10-CM | POA: Diagnosis not present

## 2024-03-16 DIAGNOSIS — F411 Generalized anxiety disorder: Secondary | ICD-10-CM | POA: Diagnosis not present

## 2024-03-16 DIAGNOSIS — I10 Essential (primary) hypertension: Secondary | ICD-10-CM | POA: Diagnosis not present

## 2024-03-22 DIAGNOSIS — Z794 Long term (current) use of insulin: Secondary | ICD-10-CM | POA: Diagnosis not present

## 2024-03-22 DIAGNOSIS — E1039 Type 1 diabetes mellitus with other diabetic ophthalmic complication: Secondary | ICD-10-CM | POA: Diagnosis not present

## 2024-03-22 DIAGNOSIS — N200 Calculus of kidney: Secondary | ICD-10-CM | POA: Diagnosis not present

## 2024-03-22 DIAGNOSIS — N186 End stage renal disease: Secondary | ICD-10-CM | POA: Diagnosis not present

## 2024-03-22 DIAGNOSIS — K8689 Other specified diseases of pancreas: Secondary | ICD-10-CM | POA: Diagnosis not present

## 2024-03-22 DIAGNOSIS — Z01818 Encounter for other preprocedural examination: Secondary | ICD-10-CM | POA: Diagnosis not present

## 2024-03-23 DIAGNOSIS — F411 Generalized anxiety disorder: Secondary | ICD-10-CM | POA: Diagnosis not present

## 2024-03-30 DIAGNOSIS — E1069 Type 1 diabetes mellitus with other specified complication: Secondary | ICD-10-CM | POA: Diagnosis not present

## 2024-03-30 DIAGNOSIS — Z794 Long term (current) use of insulin: Secondary | ICD-10-CM | POA: Diagnosis not present

## 2024-03-30 DIAGNOSIS — Z9641 Presence of insulin pump (external) (internal): Secondary | ICD-10-CM | POA: Diagnosis not present

## 2024-03-30 DIAGNOSIS — E1065 Type 1 diabetes mellitus with hyperglycemia: Secondary | ICD-10-CM | POA: Diagnosis not present

## 2024-03-30 DIAGNOSIS — F411 Generalized anxiety disorder: Secondary | ICD-10-CM | POA: Diagnosis not present

## 2024-04-04 DIAGNOSIS — Z01818 Encounter for other preprocedural examination: Secondary | ICD-10-CM | POA: Diagnosis not present

## 2024-04-06 DIAGNOSIS — E113599 Type 2 diabetes mellitus with proliferative diabetic retinopathy without macular edema, unspecified eye: Secondary | ICD-10-CM | POA: Diagnosis not present

## 2024-04-06 DIAGNOSIS — G932 Benign intracranial hypertension: Secondary | ICD-10-CM | POA: Diagnosis not present

## 2024-04-06 DIAGNOSIS — Z9889 Other specified postprocedural states: Secondary | ICD-10-CM | POA: Diagnosis not present

## 2024-04-07 DIAGNOSIS — F411 Generalized anxiety disorder: Secondary | ICD-10-CM | POA: Diagnosis not present

## 2024-04-12 DIAGNOSIS — F411 Generalized anxiety disorder: Secondary | ICD-10-CM | POA: Diagnosis not present

## 2024-04-15 ENCOUNTER — Telehealth: Admitting: Physician Assistant

## 2024-04-15 DIAGNOSIS — B9689 Other specified bacterial agents as the cause of diseases classified elsewhere: Secondary | ICD-10-CM

## 2024-04-15 DIAGNOSIS — J019 Acute sinusitis, unspecified: Secondary | ICD-10-CM

## 2024-04-15 MED ORDER — AMOXICILLIN-POT CLAVULANATE 875-125 MG PO TABS: 1.0000 | ORAL_TABLET | Freq: Two times a day (BID) | ORAL | 0 refills | Status: AC

## 2024-04-15 NOTE — Patient Instructions (Signed)
 Dela Favor, thank you for joining Angelia Kelp, PA-C for today's virtual visit.  While this provider is not your primary care provider (PCP), if your PCP is located in our provider database this encounter information will be shared with them immediately following your visit.   A [facility] MyChart account gives you access to today's visit and all your visits, tests, and labs performed at [facility]  click here if you don't have a [facility] MyChart account or go to [email or web address]  Consent: (Patient) [name] provided verbal consent for this virtual visit at the beginning of the encounter.  Current Medications:  Current Outpatient Medications:    amoxicillin -clavulanate (AUGMENTIN ) 875-125 MG tablet, Take 1 tablet by mouth 2 (two) times daily., Disp: 14 tablet, Rfl: 0   acetaZOLAMIDE  ER (DIAMOX ) 500 MG capsule, 625 mg., Disp: , Rfl:    Albuterol -Budesonide  (AIRSUPRA ) 90-80 MCG/ACT AERO, Inhale 2 puffs into the lungs every 4 (four) hours as needed., Disp: 10.7 g, Rfl: 1   atorvastatin  (LIPITOR) 20 MG tablet, Take 20 mg by mouth daily. , Disp: , Rfl: 0   BAQSIMI  TWO PACK 3 MG/DOSE POWD, USE 1 SPRAY INTO THE NOSE AS DIRECTED, Disp: 2 each, Rfl: 3   Continuous Blood Gluc Receiver (DEXCOM G6 RECEIVER) DEVI, 1 Device by Does not apply route as directed., Disp: 1 each, Rfl: 2   Continuous Blood Gluc Sensor (DEXCOM G6 SENSOR) MISC, INJECT 1 SENSOR UNDER THE SKIN AS DIRECTED( CHANGE SENSOR EVERY 10 DAYS), Disp: 3 each, Rfl: 11   Continuous Blood Gluc Sensor (FREESTYLE LIBRE 2 SENSOR) MISC, USE EVERY 14 DAYS. USE IF DEXCOM FAILS, Disp: 2 each, Rfl: 5   Continuous Blood Gluc Transmit (DEXCOM G6 TRANSMITTER) MISC, USE AS DIRECTED WITH SENSOR UP TO 8 TIMES, Disp: 1 each, Rfl: 2   EPINEPHrine  (AUVI-Q ) 0.3 mg/0.3 mL IJ SOAJ injection, Use as directed for life-threatening allergic reaction., Disp: 2 each, Rfl: 2   fluticasone -salmeterol (WIXELA INHUB)  250-50 MCG/ACT AEPB, Inhale 1 puff into the lungs in the morning and at bedtime., Disp: 60 each, Rfl: 5   HUMALOG  100 UNIT/ML injection, Use 300 units in insulin  pump every 48 hours, Disp: 120 mL, Rfl: 1   insulin  aspart (NOVOLOG ) 100 UNIT/ML injection, Use up to 300 units in pump every 48 hours, Disp: 120 mL, Rfl: 3   Insulin  Lispro Junior KwikPen (HUMALOG  JR) 100 UNIT/ML KwikPen, ADMINISTER UP TO 50 UNITS UNDER THE SKIN DAILY AS DIRECTED BY PRESCRIBER, Disp: 15 mL, Rfl: 11   Insulin  Pen Needle (PEN NEEDLES) 32G X 4 MM MISC, Use to inject insulin  6x per day, Disp: 200 each, Rfl: 5   levocetirizine (XYZAL ) 5 MG tablet, Take 1 tablet (5 mg total) by mouth daily as needed for allergies (Can take an extra dose during flare ups.)., Disp: 180 tablet, Rfl: 1   lipase/protease/amylase (CREON ) 36000 UNITS CPEP capsule, 3 pills with every meal and 2 pills with every snack, Disp: 390 capsule, Rfl: 11   losartan  (COZAAR ) 50 MG tablet, TAKE 1 TABLET(50 MG) BY MOUTH DAILY, Disp: 90 tablet, Rfl: 1   Multiple Vitamin (MULTIVITAMIN WITH MINERALS) TABS tablet, Take 1 tablet by mouth daily., Disp: , Rfl:    omeprazole  (PRILOSEC) 20 MG capsule, Take 1 capsule (20 mg total) by mouth daily., Disp: 30 capsule, Rfl: 11   ONETOUCH VERIO test strip, USE 1 STRIP TO TEST BLOOD SUGAR 8 TIMES DAILY, Disp: 250 strip, Rfl: 3   topiramate  (TOPAMAX ) 50 MG tablet,  Take 1 tablet (50 mg total) by mouth 2 (two) times daily. Please call and make overdue appt for further refills. 2nd attempt, Disp: 30 tablet, Rfl: 0   Vitamin D, Ergocalciferol, (DRISDOL) 1.25 MG (50000 UNIT) CAPS capsule, Take 50,000 Units by mouth every 7 (seven) days., Disp: , Rfl:  No current facility-administered medications for this visit.  Facility-Administered Medications Ordered in Other Visits:    gadopentetate dimeglumine  (MAGNEVIST ) injection 20 mL, 20 mL, Intravenous, Once PRN, Dohmeier, Raoul Byes, MD   Medications ordered in this encounter:  Meds ordered this  encounter  Medications   amoxicillin -clavulanate (AUGMENTIN ) 875-125 MG tablet    Sig: Take 1 tablet by mouth 2 (two) times daily.    Dispense:  14 tablet    Refill:  0    Supervising Provider:   Corine Dice [[id number]]     *If you need refills on other medications prior to your next appointment, please contact your pharmacy*  Follow-Up: Call back or seek an in-person evaluation if the symptoms worsen or if the condition fails to improve as anticipated.   Virtual Care [phone]  Other Instructions  Upper Respiratory Infection, Adult An upper respiratory infection (URI) is a common viral infection of the nose, throat, and upper air passages that lead to the lungs. The most common type of URI is the common cold. URIs usually get better on their own, without medical treatment. What are the causes? A URI is caused by a virus. You may catch a virus by: Breathing in droplets from an infected person's cough or sneeze. Touching something that has been exposed to the virus (is contaminated) and then touching your mouth, nose, or eyes. What increases the risk? You are more likely to get a URI if: You are very young or very old. You have close contact with others, such as at work, school, or a health care facility. You smoke. You have long-term (chronic) heart or lung disease. You have a weakened disease-fighting system (immune system). You have nasal allergies or asthma. You are experiencing a lot of stress. You have poor nutrition. What are the signs or symptoms? A URI usually involves some of the following symptoms: Runny or stuffy (congested) nose. Cough. Sneezing. Sore throat. Headache. Fatigue. Fever. Loss of appetite. Pain in your forehead, behind your eyes, and over your cheekbones (sinus pain). Muscle aches. Redness or irritation of the eyes. Pressure in the ears or face. How is this diagnosed? This condition may be diagnosed based on your medical  history and symptoms, and a physical exam. Your health care provider may use a swab to take a mucus sample from your nose (nasal swab). This sample can be tested to determine what virus is causing the illness. How is this treated? URIs usually get better on their own within 7-10 days. Medicines cannot cure URIs, but your health care provider may recommend certain medicines to help relieve symptoms, such as: Over-the-counter cold medicines. Cough suppressants. Coughing is a type of defense against infection that helps to clear the respiratory system, so take these medicines only as recommended by your health care provider. Fever-reducing medicines. Follow these instructions at home: Activity Rest as needed. If you have a fever, stay home from work or school until your fever is gone or until your health care provider says your URI cannot spread to other people (is no longer contagious). Your health care provider may have you wear a face mask to prevent your infection from spreading. Relieving symptoms Gargle with  a mixture of salt and water 3-4 times a day or as needed. To make salt water, completely dissolve -1 tsp (3-6 g) of salt in 1 cup (237 mL) of warm water. Use a cool-mist humidifier to add moisture to the air. This can help you breathe more easily. Eating and drinking  Drink enough fluid to keep your urine pale yellow. Eat soups and other clear broths. General instructions  Take over-the-counter and prescription medicines only as told by your health care provider. These include cold medicines, fever reducers, and cough suppressants. Do not use any products that contain nicotine or tobacco. These products include cigarettes, chewing tobacco, and vaping devices, such as e-cigarettes. If you need help quitting, ask your health care provider. Stay away from secondhand smoke. Stay up to date on all immunizations, including the yearly (annual) flu vaccine. Keep all follow-up visits. This is  important. How to prevent the spread of infection to others URIs can be contagious. To prevent the infection from spreading: Wash your hands with soap and water for at least 20 seconds. If soap and water are not available, use hand sanitizer. Avoid touching your mouth, face, eyes, or nose. Cough or sneeze into a tissue or your sleeve or elbow instead of into your hand or into the air.  Contact a health care provider if: You are getting worse instead of better. You have a fever or chills. Your mucus is brown or red. You have yellow or brown discharge coming from your nose. You have pain in your face, especially when you bend forward. You have swollen neck glands. You have pain while swallowing. You have white areas in the back of your throat. Get help right away if: You have shortness of breath that gets worse. You have severe or persistent: Headache. Ear pain. Sinus pain. Chest pain. You have chronic lung disease along with any of the following: Making high-pitched whistling sounds when you breathe, most often when you breathe out (wheezing). Prolonged cough (more than 14 days). Coughing up blood. A change in your usual mucus. You have a stiff neck. You have changes in your: Vision. Hearing. Thinking. Mood. These symptoms may be an emergency. Get help right away. Call 911. Do not wait to see if the symptoms will go away. Do not drive yourself to the hospital. Summary An upper respiratory infection (URI) is a common infection of the nose, throat, and upper air passages that lead to the lungs. A URI is caused by a virus. URIs usually get better on their own within 7-10 days. Medicines cannot cure URIs, but your health care provider may recommend certain medicines to help relieve symptoms. This information is not intended to replace advice given to you by your health care provider. Make sure you discuss any questions you have with your health care provider. Document Revised:  05/15/2021 Document Reviewed: 05/15/2021 Elsevier Patient Education  2024 Elsevier Inc.   If you have been instructed to have an in-person evaluation today at a local Urgent Care facility, please use the link below. It will take you to a list of all of our available [facility], including address, phone number and hours of operation. Please do not delay care.  [facility]  If you or a family member do not have a primary care provider, use the link below to schedule a visit and establish care. When you choose a [facility] primary care physician or advanced practice provider, you gain a long-term partner in health.  Find a Primary Care Provider  Learn more about [facility]'s in-office and virtual care options: [facility] - Get Care Now

## 2024-04-15 NOTE — Progress Notes (Signed)
 Virtual Visit Consent   [name], you are scheduled for a virtual visit with a [facility] provider today. Just as with appointments in the office, your consent must be obtained to participate. Your consent will be active for this visit and any virtual visit you may have with one of our providers in the next 365 days. If you have a MyChart account, a copy of this consent can be sent to you electronically.  As this is a virtual visit, video technology does not allow for your provider to perform a traditional examination. This may limit your provider's ability to fully assess your condition. If your provider identifies any concerns that need to be evaluated in person or the need to arrange testing (such as labs, EKG, etc.), we will make arrangements to do so. Although advances in technology are sophisticated, we cannot ensure that it will always work on either your end or our end. If the connection with a video visit is poor, the visit may have to be switched to a telephone visit. With either a video or telephone visit, we are not always able to ensure that we have a secure connection.  By engaging in this virtual visit, you consent to the provision of healthcare and authorize for your insurance to be billed (if applicable) for the services provided during this visit. Depending on your insurance coverage, you may receive a charge related to this service.  I need to obtain your verbal consent now. Are you willing to proceed with your visit today? [name] has provided verbal consent on 04/15/2024 for a virtual visit (video or telephone). Angelia Kelp, PA-C  Date: 04/15/2024 12:38 PM   Virtual Visit via Video Note   I, Angelia Kelp, connected with  [name]  ([id number], [birth year]) on 04/15/24 at 12:30 PM EDT by a video-enabled telemedicine application and verified that I am speaking with the correct person using two identifiers.  Location: Patient:  Virtual Visit Location Patient: Home Provider: Virtual Visit Location Provider: Home Office   I discussed the limitations of evaluation and management by telemedicine and the availability of in person appointments. The patient expressed understanding and agreed to proceed.    History of Present Illness: [name] is a 43 y.o. who identifies as a male who was assigned male at birth, and is being seen today for URI symptoms.  HPI: URI  This is a new problem. The current episode started 1 to 4 weeks ago. The problem has been gradually worsening. There has been no fever. Associated symptoms include chest pain (tightness), congestion, rhinorrhea (and post nasal drainage) and a sore throat (initial symptom, improved). Pertinent negatives include no coughing, diarrhea, ear pain, headaches, nausea, plugged ear sensation, sinus pain, vomiting or wheezing. Associated symptoms comments: Shortness of breath with activity, fatigue, myalgias. Treatments tried: sugar-free cough drops. The treatment provided no relief.      Problems:  Patient Active Problem List   Diagnosis Date Noted   Bleeding at insertion site 11/05/2023   Asthma, severe persistent, well-controlled 11/05/2023   Exocrine pancreatic insufficiency 02/27/2021   Hypoglycemia 01/30/2021   Nuclear sclerotic cataract of both eyes 09/12/2020   Ischemic optic neuritis, right 07/19/2020   Cerebral arteriosclerosis 07/19/2020   Photophobia, left eye 07/19/2020   Optic neuritis, posterior 07/10/2020   Color vision deficiency 07/10/2020   Vitreomacular adhesion of left eye 07/03/2020   H/O visual field defect 06/14/2020   Stable treated proliferative diabetic retinopathy of left eye  with macular edema determined by examination associated with type 1 diabetes mellitus (HCC) 05/28/2020   Benign intracranial hypertension 05/28/2020   Stable treated proliferative diabetic retinopathy of right eye with macular edema determined by  examination associated with type 1 diabetes mellitus (HCC) 05/28/2020   Vitreous hemorrhage of right eye (HCC) 05/28/2020   Pain in joint, shoulder region 04/17/2020   Finger numbness 04/17/2020   Moderate persistent asthma without complication 12/27/2019   Contusion of back, right, initial encounter 04/12/2019   Left shoulder pain 04/12/2019   Acute pain of left wrist 04/12/2019   Pseudotumor cerebri syndrome 02/16/2019   Class 1 obesity with serious comorbidity and body mass index (BMI) of 32.0 to 32.9 in adult 02/16/2019   Idiopathic normal pressure hydrocephalus (HCC) 12/15/2018   Severe persistent asthma with acute exacerbation 08/31/2018   Seasonal and perennial allergic rhinitis 08/31/2018   Mild intermittent asthma without complication 07/12/2018   Type 1 diabetes mellitus with other specified complication (HCC) 06/09/2018   Sore throat 04/26/2018   Moderate persistent asthma with acute exacerbation 04/26/2018   Other allergic rhinitis 04/26/2018   Gastroesophageal reflux disease 04/26/2018   Food allergy 04/26/2018   Pseudotumor cerebri 03/25/2018   Morbid obesity with BMI of 40.0-44.9, adult (HCC) 03/25/2018   Altitude sickness, subsequent encounter 03/25/2018   Encounter for medication management 03/25/2018   Snoring 03/25/2018   Increased intracranial pressure 03/20/2018   Low back pain 03/10/2018   Cervicalgia of occipito-atlanto-axial region 08/31/2017   Type 1 diabetes mellitus with complication (HCC) 08/31/2017   Calcified lymph nodes 06/04/2017   Insulin  pump in place 05/28/2017   Neuropathy 05/28/2017   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, with proliferative retinopathy (HCC) 05/28/2017   Other symptoms and signs involving the nervous system 05/28/2017   Nightmares REM-sleep type 05/28/2017   Proliferative diabetic retinopathy, right eye (HCC) 04/11/2017   Benign paroxysmal positional vertigo 03/29/2017   Costochondritis 03/06/2017   Acute  upper respiratory infection 03/06/2017   Combined hyperlipidemia 02/11/2016   Metatarsalgia of right foot 02/07/2016   Lactose intolerance 01/07/2016   Inappropriate sinus tachycardia (HCC) 06/29/2015   Thoracic back pain 11/02/2013   Type 1 diabetes mellitus not at goal [facility])    Goiter    Hypercholesterolemia    ADHD (attention deficit hyperactivity disorder)    Fatigue    Tachycardia    Autonomic neuropathy associated with type 1 diabetes mellitus (HCC)    Hypertension    Uncontrolled DM with microalbuminuria or microproteinuria    Hypoglycemia associated with diabetes (HCC)    DIABETES MELLITUS, I 12/24/2006   ATTENTION DEFICIT, W/HYPERACTIVITY 12/24/2006    Allergies:  Allergies  Allergen Reactions   Fluticasone  Shortness Of Breath and Cough    Tachycardia   Sulfa Antibiotics Nausea Only and Shortness Of Breath    Shortness of breath, increased heart rate; happened quite a while ago   Sulfites Itching, Swelling, Nausea And Vomiting, Cough and Palpitations    Tachycardia, flushing   Lactose Nausea Only, Diarrhea and Nausea And Vomiting    Upset stomach   Molds & Smuts Other (See Comments), Cough and Rash    Aggravate asthma   Tetracyclines & Related Other (See Comments)    increased intracranial pressure  Other Reaction(s): craninal pressure increase  Increased Intracranial pressure    increased intracranial pressure  increased intracranial pressure, increased intracranial pressure   Bet[place] Oil Rash   Lactose Intolerance (Gi) Diarrhea and Nausea And Vomiting    Upset stomach  Pollen Extract Cough   [place] Cough   Medications:  Current Outpatient Medications:    amoxicillin -clavulanate (AUGMENTIN ) 875-125 MG tablet, Take 1 tablet by mouth 2 (two) times daily., Disp: 14 tablet, Rfl: 0   acetaZOLAMIDE  ER (DIAMOX ) 500 MG capsule, 625 mg., Disp: , Rfl:    Albuterol -Budesonide  (AIRSUPRA ) 90-80 MCG/ACT AERO, Inhale 2 puffs into the lungs every 4 (four)  hours as needed., Disp: 10.7 g, Rfl: 1   atorvastatin  (LIPITOR) 20 MG tablet, Take 20 mg by mouth daily. , Disp: , Rfl: 0   BAQSIMI  TWO PACK 3 MG/DOSE POWD, USE 1 SPRAY INTO THE NOSE AS DIRECTED, Disp: 2 each, Rfl: 3   Continuous Blood Gluc Receiver (DEXCOM G6 RECEIVER) DEVI, 1 Device by Does not apply route as directed., Disp: 1 each, Rfl: 2   Continuous Blood Gluc Sensor (DEXCOM G6 SENSOR) MISC, INJECT 1 SENSOR UNDER THE SKIN AS DIRECTED( CHANGE SENSOR EVERY 10 DAYS), Disp: 3 each, Rfl: 11   Continuous Blood Gluc Sensor (FREESTYLE LIBRE 2 SENSOR) MISC, USE EVERY 14 DAYS. USE IF DEXCOM FAILS, Disp: 2 each, Rfl: 5   Continuous Blood Gluc Transmit (DEXCOM G6 TRANSMITTER) MISC, USE AS DIRECTED WITH SENSOR UP TO 8 TIMES, Disp: 1 each, Rfl: 2   EPINEPHrine  (AUVI-Q ) 0.3 mg/0.3 mL IJ SOAJ injection, Use as directed for life-threatening allergic reaction., Disp: 2 each, Rfl: 2   fluticasone -salmeterol (WIXELA INHUB) 250-50 MCG/ACT AEPB, Inhale 1 puff into the lungs in the morning and at bedtime., Disp: 60 each, Rfl: 5   HUMALOG  100 UNIT/ML injection, Use 300 units in insulin  pump every 48 hours, Disp: 120 mL, Rfl: 1   insulin  aspart (NOVOLOG ) 100 UNIT/ML injection, Use up to 300 units in pump every 48 hours, Disp: 120 mL, Rfl: 3   Insulin  Lispro Junior KwikPen (HUMALOG  JR) 100 UNIT/ML KwikPen, ADMINISTER UP TO 50 UNITS UNDER THE SKIN DAILY AS DIRECTED BY PRESCRIBER, Disp: 15 mL, Rfl: 11   Insulin  Pen Needle (PEN NEEDLES) 32G X 4 MM MISC, Use to inject insulin  6x per day, Disp: 200 each, Rfl: 5   levocetirizine (XYZAL ) 5 MG tablet, Take 1 tablet (5 mg total) by mouth daily as needed for allergies (Can take an extra dose during flare ups.)., Disp: 180 tablet, Rfl: 1   lipase/protease/amylase (CREON ) 36000 UNITS CPEP capsule, 3 pills with every meal and 2 pills with every snack, Disp: 390 capsule, Rfl: 11   losartan  (COZAAR ) 50 MG tablet, TAKE 1 TABLET(50 MG) BY MOUTH DAILY, Disp: 90 tablet, Rfl: 1   Multiple  Vitamin (MULTIVITAMIN WITH MINERALS) TABS tablet, Take 1 tablet by mouth daily., Disp: , Rfl:    omeprazole  (PRILOSEC) 20 MG capsule, Take 1 capsule (20 mg total) by mouth daily., Disp: 30 capsule, Rfl: 11   ONETOUCH VERIO test strip, USE 1 STRIP TO TEST BLOOD SUGAR 8 TIMES DAILY, Disp: 250 strip, Rfl: 3   topiramate  (TOPAMAX ) 50 MG tablet, Take 1 tablet (50 mg total) by mouth 2 (two) times daily. Please call and make overdue appt for further refills. 2nd attempt, Disp: 30 tablet, Rfl: 0   Vitamin D, Ergocalciferol, (DRISDOL) 1.25 MG (50000 UNIT) CAPS capsule, Take 50,000 Units by mouth every 7 (seven) days., Disp: , Rfl:  No current facility-administered medications for this visit.  Facility-Administered Medications Ordered in Other Visits:    gadopentetate dimeglumine  (MAGNEVIST ) injection 20 mL, 20 mL, Intravenous, Once PRN, Dohmeier, Raoul Byes, MD  Observations/Objective: Patient is well-developed, well-nourished in no acute distress.  Resting comfortably  at home.  Head is normocephalic, atraumatic.  No labored breathing.  Speech is clear and coherent with logical content.  Patient is alert and oriented at baseline.    Assessment and Plan: 1. Acute bacterial sinusitis (Primary) - amoxicillin -clavulanate (AUGMENTIN ) 875-125 MG tablet; Take 1 tablet by mouth 2 (two) times daily.  Dispense: 14 tablet; Refill: 0  - Worsening symptoms that have not responded to OTC medications.  - Will give Augmentin  - Advised to use his at home inhalers for the chest tightness and shortness of breath as he has not used them yet, but does have them  - Continue allergy medications.  - Steam and humidifier can help - Stay well hydrated and get plenty of rest.  - Seek in person evaluation if no symptom improvement or if symptoms worsen   Follow Up Instructions: I discussed the assessment and treatment plan with the patient. The patient was provided an opportunity to ask questions and all were answered. The  patient agreed with the plan and demonstrated an understanding of the instructions.  A copy of instructions were sent to the patient via MyChart unless otherwise noted below.    The patient was advised to call back or seek an in-person evaluation if the symptoms worsen or if the condition fails to improve as anticipated.    Angelia Kelp, PA-C

## 2024-04-18 DIAGNOSIS — D485 Neoplasm of uncertain behavior of skin: Secondary | ICD-10-CM | POA: Diagnosis not present

## 2024-04-18 DIAGNOSIS — L304 Erythema intertrigo: Secondary | ICD-10-CM | POA: Diagnosis not present

## 2024-04-18 DIAGNOSIS — D225 Melanocytic nevi of trunk: Secondary | ICD-10-CM | POA: Diagnosis not present

## 2024-04-20 DIAGNOSIS — F411 Generalized anxiety disorder: Secondary | ICD-10-CM | POA: Diagnosis not present

## 2024-04-22 ENCOUNTER — Encounter (HOSPITAL_COMMUNITY): Payer: Self-pay | Admitting: Interventional Radiology

## 2024-05-09 ENCOUNTER — Ambulatory Visit (INDEPENDENT_AMBULATORY_CARE_PROVIDER_SITE_OTHER): Admitting: Family Medicine

## 2024-05-09 ENCOUNTER — Other Ambulatory Visit: Payer: Self-pay

## 2024-05-09 VITALS — BP 116/63 | Ht 72.0 in | Wt 192.0 lb

## 2024-05-09 DIAGNOSIS — M25512 Pain in left shoulder: Secondary | ICD-10-CM | POA: Diagnosis not present

## 2024-05-09 NOTE — Patient Instructions (Signed)
 You have rotator cuff impingement, at least labral contusion Try to avoid painful activities (overhead activities, lifting with extended arm) as much as possible. Ibuprofen 600mg  three times a day with food for pain and inflammation - take for 7 days then as needed. Can take tylenol  in addition to this. Subacromial injection may be beneficial to help with pain and to decrease inflammation once out of the more acute phase of this Consider physical therapy with transition to home exercise program. Do home exercise program with theraband and scapular stabilization exercises daily 3 sets of 10 once a day. If not improving at follow-up we will consider further imaging, injection, physical therapy. Follow up with me in 1 month.

## 2024-05-10 ENCOUNTER — Encounter: Payer: Self-pay | Admitting: Family Medicine

## 2024-05-10 NOTE — Progress Notes (Signed)
 PCP: Jacques Camie Pepper, PA-C  Subjective:   HPI: Patient is a 43 y.o. male here for left shoulder injury.  Patient reports 1 month ago he was pulling up a garage roll-up door when it took off and yanked his left arm superiorly. Felt pain within left shoulder at the time that has persisted. Feels a sharp pain when he moves wrong. Wakes him up occasionally. Bothers reaching behind his back.  Past Medical History:  Diagnosis Date   ADHD (attention deficit hyperactivity disorder)    Anxiety    Asthma    Autonomic neuropathy due to diabetes (HCC)    Chronic headaches    Diabetes (HCC)    Fatigue    GERD (gastroesophageal reflux disease)    Goiter    High blood pressure    High cholesterol    Hypercholesterolemia    Hypertension    Hypoglycemia associated with diabetes (HCC)    Malnutrition (HCC)    Obesity    Sleep apnea    mild, no cpap   Tachycardia    Type 1 diabetes mellitus not at goal [facility])    Uncontrolled DM with microalbuminuria or microproteinuria     Current Outpatient Medications on File Prior to Visit  Medication Sig Dispense Refill   acetaZOLAMIDE  ER (DIAMOX ) 500 MG capsule 625 mg.     Albuterol -Budesonide  (AIRSUPRA ) 90-80 MCG/ACT AERO Inhale 2 puffs into the lungs every 4 (four) hours as needed. 10.7 g 1   amoxicillin -clavulanate (AUGMENTIN ) 875-125 MG tablet Take 1 tablet by mouth 2 (two) times daily. 14 tablet 0   atorvastatin  (LIPITOR) 20 MG tablet Take 20 mg by mouth daily.   0   BAQSIMI  TWO PACK 3 MG/DOSE POWD USE 1 SPRAY INTO THE NOSE AS DIRECTED 2 each 3   Continuous Blood Gluc Receiver (DEXCOM G6 RECEIVER) DEVI 1 Device by Does not apply route as directed. 1 each 2   Continuous Blood Gluc Sensor (DEXCOM G6 SENSOR) MISC INJECT 1 SENSOR UNDER THE SKIN AS DIRECTED( CHANGE SENSOR EVERY 10 DAYS) 3 each 11   Continuous Blood Gluc Sensor (FREESTYLE LIBRE 2 SENSOR) MISC USE EVERY 14 DAYS. USE IF DEXCOM FAILS 2 each 5   Continuous Blood Gluc Transmit (DEXCOM  G6 TRANSMITTER) MISC USE AS DIRECTED WITH SENSOR UP TO 8 TIMES 1 each 2   EPINEPHrine  (AUVI-Q ) 0.3 mg/0.3 mL IJ SOAJ injection Use as directed for life-threatening allergic reaction. 2 each 2   fluticasone -salmeterol (WIXELA INHUB) 250-50 MCG/ACT AEPB Inhale 1 puff into the lungs in the morning and at bedtime. 60 each 5   HUMALOG  100 UNIT/ML injection Use 300 units in insulin  pump every 48 hours 120 mL 1   insulin  aspart (NOVOLOG ) 100 UNIT/ML injection Use up to 300 units in pump every 48 hours 120 mL 3   Insulin  Lispro Junior KwikPen (HUMALOG  JR) 100 UNIT/ML KwikPen ADMINISTER UP TO 50 UNITS UNDER THE SKIN DAILY AS DIRECTED BY PRESCRIBER 15 mL 11   Insulin  Pen Needle (PEN NEEDLES) 32G X 4 MM MISC Use to inject insulin  6x per day 200 each 5   levocetirizine (XYZAL ) 5 MG tablet Take 1 tablet (5 mg total) by mouth daily as needed for allergies (Can take an extra dose during flare ups.). 180 tablet 1   lipase/protease/amylase (CREON ) 36000 UNITS CPEP capsule 3 pills with every meal and 2 pills with every snack 390 capsule 11   losartan  (COZAAR ) 50 MG tablet TAKE 1 TABLET(50 MG) BY MOUTH DAILY 90 tablet 1  Multiple Vitamin (MULTIVITAMIN WITH MINERALS) TABS tablet Take 1 tablet by mouth daily.     omeprazole  (PRILOSEC) 20 MG capsule Take 1 capsule (20 mg total) by mouth daily. 30 capsule 11   ONETOUCH VERIO test strip USE 1 STRIP TO TEST BLOOD SUGAR 8 TIMES DAILY 250 strip 3   topiramate  (TOPAMAX ) 50 MG tablet Take 1 tablet (50 mg total) by mouth 2 (two) times daily. Please call and make overdue appt for further refills. 2nd attempt 30 tablet 0   Vitamin D, Ergocalciferol, (DRISDOL) 1.25 MG (50000 UNIT) CAPS capsule Take 50,000 Units by mouth every 7 (seven) days.     Current Facility-Administered Medications on File Prior to Visit  Medication Dose Route Frequency Provider Last Rate Last Admin   gadopentetate dimeglumine  (MAGNEVIST ) injection 20 mL  20 mL Intravenous Once PRN Dohmeier, Dedra, MD         Past Surgical History:  Procedure Laterality Date   EYE SURGERY Right 05/30/2020   Vitrectomy, Dr. Elner   EYE SURGERY  04/2020   IR 3D INDEPENDENT WKST  07/16/2020   IR ANGIO INTRA EXTRACRAN SEL COM CAROTID INNOMINATE BILAT MOD SED  07/16/2020   IR ANGIO VERTEBRAL SEL SUBCLAVIAN INNOMINATE UNI L MOD SED  07/16/2020   IR ANGIO VERTEBRAL SEL VERTEBRAL UNI R MOD SED  07/16/2020   IR US  GUIDE VASC ACCESS RIGHT  07/16/2020   REFRACTIVE SURGERY     x9   WISDOM TOOTH EXTRACTION      Allergies  Allergen Reactions   Fluticasone  Shortness Of Breath and Cough    Tachycardia   Sulfa Antibiotics Nausea Only and Shortness Of Breath    Shortness of breath, increased heart rate; happened quite a while ago   Sulfites Itching, Swelling, Nausea And Vomiting, Cough and Palpitations    Tachycardia, flushing   Lactose Nausea Only, Diarrhea and Nausea And Vomiting    Upset stomach   Molds & Smuts Other (See Comments), Cough and Rash    Aggravate asthma   Tetracyclines & Related Other (See Comments)    increased intracranial pressure  Other Reaction(s): craninal pressure increase  Increased Intracranial pressure    increased intracranial pressure  increased intracranial pressure, increased intracranial pressure   Betula Alba Oil Rash   Lactose Intolerance (Gi) Diarrhea and Nausea And Vomiting    Upset stomach   Pollen Extract Cough   White Birch Cough    BP 116/63   Ht 6' (1.829 m)   Wt 192 lb (87.1 kg)   BMI 26.04 kg/m      09/11/2020    9:45 AM 06/24/2021   10:44 AM 08/05/2021   10:00 AM 09/16/2021   10:08 AM  [facility] Adult Exercise  Frequency of aerobic exercise (# of days/week) 5 3 3 3   Average time in minutes 30 15 15 15   Frequency of strengthening activities (# of days/week) 0 1 1 1         No data to display              Objective:  Physical Exam:  Gen: NAD, comfortable in exam room  Left shoulder: No swelling, ecchymoses.  No gross  deformity. No TTP AC joint, biceps tendon. FROM with painful arc. Positive Hawkins, Neers. Negative Yergasons. Strength 5/5 with empty can and resisted internal/external rotation. Negative apprehension, sulcus, o'briens NV intact distally.  Complete MSK u/s left shoulder: Biceps tendon: intact on long and short views without tenosynovitis Pec major tendon: intact Subscapularis: intact without  tear in long and short axis AC joint: mild effusion but otherwise normal Infraspinatus: intact without tears Teres minor: intact without tears Supraspinatus: intact without tears but with mild subacromial bursitis Posterior glenohumeral joint: visible posterior labrum intact.  No paralabral cyst, no effusion  Impression: Subacromial bursitis.  Otherwise normal ultrasound   Assessment & Plan:  1. Left shoulder pain - due to rotator cuff impingement.  Mechanism suggests concern for labral contusion or injury though exam reassuring to this end.  Ibuprofen for 7 days then as needed.  Start home exercise program which was reviewed today.  Consider subacromial injection, physical therapy if not improving.  Follow up in 1 month.

## 2024-05-11 DIAGNOSIS — E1040 Type 1 diabetes mellitus with diabetic neuropathy, unspecified: Secondary | ICD-10-CM | POA: Diagnosis not present

## 2024-05-12 DIAGNOSIS — H43822 Vitreomacular adhesion, left eye: Secondary | ICD-10-CM | POA: Diagnosis not present

## 2024-05-12 DIAGNOSIS — H2513 Age-related nuclear cataract, bilateral: Secondary | ICD-10-CM | POA: Diagnosis not present

## 2024-05-12 DIAGNOSIS — E103553 Type 1 diabetes mellitus with stable proliferative diabetic retinopathy, bilateral: Secondary | ICD-10-CM | POA: Diagnosis not present

## 2024-05-18 DIAGNOSIS — F411 Generalized anxiety disorder: Secondary | ICD-10-CM | POA: Diagnosis not present

## 2024-05-26 DIAGNOSIS — E1021 Type 1 diabetes mellitus with diabetic nephropathy: Secondary | ICD-10-CM | POA: Diagnosis not present

## 2024-05-26 DIAGNOSIS — Z01818 Encounter for other preprocedural examination: Secondary | ICD-10-CM | POA: Diagnosis not present

## 2024-05-26 DIAGNOSIS — G629 Polyneuropathy, unspecified: Secondary | ICD-10-CM | POA: Diagnosis not present

## 2024-05-26 DIAGNOSIS — G909 Disorder of the autonomic nervous system, unspecified: Secondary | ICD-10-CM | POA: Diagnosis not present

## 2024-06-06 DIAGNOSIS — Z01818 Encounter for other preprocedural examination: Secondary | ICD-10-CM | POA: Diagnosis not present

## 2024-06-13 ENCOUNTER — Ambulatory Visit (INDEPENDENT_AMBULATORY_CARE_PROVIDER_SITE_OTHER): Admitting: Family Medicine

## 2024-06-13 VITALS — BP 124/82 | Ht 72.0 in | Wt 192.0 lb

## 2024-06-13 DIAGNOSIS — M25512 Pain in left shoulder: Secondary | ICD-10-CM

## 2024-06-13 NOTE — Patient Instructions (Signed)
 Start physical therapy and do home exercises on days you don't go to therapy. Try to avoid painful activities (overhead activities, lifting with extended arm) as much as possible. Ibuprofen 600mg  three times a day with food for pain and inflammation as needed. Can take tylenol  in addition to this. Subacromial injection may be beneficial to help with pain and to decrease inflammation  If not improving at follow-up we will consider MRI, injection. Follow up with me in 6 weeks.

## 2024-06-13 NOTE — Progress Notes (Unsigned)
 PCP: Jacques Camie Pepper, PA-C  Subjective:   HPI: Patient is a 43 y.o. male here for repeat evaluation of left shoulder pain, he was last seen in clinic on 05/09/2024.  Patient reports that his left shoulder pain has remained relatively unchanged since his appointment a little over a month ago. He has been doing home exercise therapy as well as resting the left shoulder by not lifting more than 15lbs. He has also been taking Ibuprofen as needed for pain.   Past Medical History:  Diagnosis Date   ADHD (attention deficit hyperactivity disorder)    Anxiety    Asthma    Autonomic neuropathy due to diabetes (HCC)    Chronic headaches    Diabetes (HCC)    Fatigue    GERD (gastroesophageal reflux disease)    Goiter    High blood pressure    High cholesterol    Hypercholesterolemia    Hypertension    Hypoglycemia associated with diabetes (HCC)    Malnutrition (HCC)    Obesity    Sleep apnea    mild, no cpap   Tachycardia    Type 1 diabetes mellitus not at goal [facility])    Uncontrolled DM with microalbuminuria or microproteinuria     Current Outpatient Medications on File Prior to Visit  Medication Sig Dispense Refill   acetaZOLAMIDE  ER (DIAMOX ) 500 MG capsule 625 mg.     Albuterol -Budesonide  (AIRSUPRA ) 90-80 MCG/ACT AERO Inhale 2 puffs into the lungs every 4 (four) hours as needed. 10.7 g 1   amoxicillin -clavulanate (AUGMENTIN ) 875-125 MG tablet Take 1 tablet by mouth 2 (two) times daily. 14 tablet 0   atorvastatin  (LIPITOR) 20 MG tablet Take 20 mg by mouth daily.   0   BAQSIMI  TWO PACK 3 MG/DOSE POWD USE 1 SPRAY INTO THE NOSE AS DIRECTED 2 each 3   Continuous Blood Gluc Receiver (DEXCOM G6 RECEIVER) DEVI 1 Device by Does not apply route as directed. 1 each 2   Continuous Blood Gluc Sensor (DEXCOM G6 SENSOR) MISC INJECT 1 SENSOR UNDER THE SKIN AS DIRECTED( CHANGE SENSOR EVERY 10 DAYS) 3 each 11   Continuous Blood Gluc Sensor (FREESTYLE LIBRE 2 SENSOR) MISC USE EVERY 14 DAYS. USE IF  DEXCOM FAILS 2 each 5   Continuous Blood Gluc Transmit (DEXCOM G6 TRANSMITTER) MISC USE AS DIRECTED WITH SENSOR UP TO 8 TIMES 1 each 2   EPINEPHrine  (AUVI-Q ) 0.3 mg/0.3 mL IJ SOAJ injection Use as directed for life-threatening allergic reaction. 2 each 2   fluticasone -salmeterol (WIXELA INHUB) 250-50 MCG/ACT AEPB Inhale 1 puff into the lungs in the morning and at bedtime. 60 each 5   HUMALOG  100 UNIT/ML injection Use 300 units in insulin  pump every 48 hours 120 mL 1   insulin  aspart (NOVOLOG ) 100 UNIT/ML injection Use up to 300 units in pump every 48 hours 120 mL 3   Insulin  Lispro Junior KwikPen (HUMALOG  JR) 100 UNIT/ML KwikPen ADMINISTER UP TO 50 UNITS UNDER THE SKIN DAILY AS DIRECTED BY PRESCRIBER 15 mL 11   Insulin  Pen Needle (PEN NEEDLES) 32G X 4 MM MISC Use to inject insulin  6x per day 200 each 5   levocetirizine (XYZAL ) 5 MG tablet Take 1 tablet (5 mg total) by mouth daily as needed for allergies (Can take an extra dose during flare ups.). 180 tablet 1   lipase/protease/amylase (CREON ) 36000 UNITS CPEP capsule 3 pills with every meal and 2 pills with every snack 390 capsule 11   losartan  (COZAAR ) 50 MG tablet TAKE  1 TABLET(50 MG) BY MOUTH DAILY 90 tablet 1   Multiple Vitamin (MULTIVITAMIN WITH MINERALS) TABS tablet Take 1 tablet by mouth daily.     omeprazole  (PRILOSEC) 20 MG capsule Take 1 capsule (20 mg total) by mouth daily. 30 capsule 11   ONETOUCH VERIO test strip USE 1 STRIP TO TEST BLOOD SUGAR 8 TIMES DAILY 250 strip 3   topiramate  (TOPAMAX ) 50 MG tablet Take 1 tablet (50 mg total) by mouth 2 (two) times daily. Please call and make overdue appt for further refills. 2nd attempt 30 tablet 0   Vitamin D, Ergocalciferol, (DRISDOL) 1.25 MG (50000 UNIT) CAPS capsule Take 50,000 Units by mouth every 7 (seven) days.     Current Facility-Administered Medications on File Prior to Visit  Medication Dose Route Frequency Provider Last Rate Last Admin   gadopentetate dimeglumine  (MAGNEVIST )  injection 20 mL  20 mL Intravenous Once PRN Dohmeier, Dedra, MD        Past Surgical History:  Procedure Laterality Date   EYE SURGERY Right 05/30/2020   Vitrectomy, Dr. Elner   EYE SURGERY  04/2020   IR 3D INDEPENDENT WKST  07/16/2020   IR ANGIO INTRA EXTRACRAN SEL COM CAROTID INNOMINATE BILAT MOD SED  07/16/2020   IR ANGIO VERTEBRAL SEL SUBCLAVIAN INNOMINATE UNI L MOD SED  07/16/2020   IR ANGIO VERTEBRAL SEL VERTEBRAL UNI R MOD SED  07/16/2020   IR US  GUIDE VASC ACCESS RIGHT  07/16/2020   REFRACTIVE SURGERY     x9   WISDOM TOOTH EXTRACTION      Allergies  Allergen Reactions   Fluticasone  Shortness Of Breath and Cough    Tachycardia   Sulfa Antibiotics Nausea Only and Shortness Of Breath    Shortness of breath, increased heart rate; happened quite a while ago   Sulfites Itching, Swelling, Nausea And Vomiting, Cough and Palpitations    Tachycardia, flushing   Lactose Nausea Only, Diarrhea and Nausea And Vomiting    Upset stomach   Molds & Smuts Other (See Comments), Cough and Rash    Aggravate asthma   Tetracyclines & Related Other (See Comments)    increased intracranial pressure  Other Reaction(s): craninal pressure increase  Increased Intracranial pressure    increased intracranial pressure  increased intracranial pressure, increased intracranial pressure   Betula Alba Oil Rash   Lactose Intolerance (Gi) Diarrhea and Nausea And Vomiting    Upset stomach   Pollen Extract Cough   White [place]ch Cough    BP 124/82   Ht 6' (1.829 m)   Wt 192 lb (87.1 kg)   BMI 26.04 kg/m      09/11/2020    9:45 AM 06/24/2021   10:44 AM 08/05/2021   10:00 AM 09/16/2021   10:08 AM  [facility] Adult Exercise  Frequency of aerobic exercise (# of days/week) 5 3 3 3   Average time in minutes 30 15 15 15   Frequency of strengthening activities (# of days/week) 0 1 1 1         No data to display              Objective:  Physical Exam:  Gen: NAD, comfortable in  exam room  MSK: Left shoulder Inspection: No bony or soft tissue abnormalities appreciated. Appears symmetric in appearance compared to right shoulder.  Palpation: There is no tenderness to palpation of the clavicle, AC joint, bicipital groove, or posterior shoulder.  ROM: FROM with external rotation and forward flexion but with painful arc. Limited range  of motion with abduction and internal rotation due to pain.  Strength: 5/5 with flexion/extension and internal/external rotation, though patient endorses discomfort with resistance Neuro/Vasc: Neurovascularly intact distally.  Special Tests: Neer's positive. Hawkin's, Empty can, O'Brien's, Speed's, and Yergason's negative.    Assessment & Plan:  1. Left shoulder pain - Patient's left shoulder pain and range of motion are improved on exam today in clinic. Discussed with patient that if his symptoms remain bothersome, we could consider an intra-articular injection of Cortisone or Toradol . However, Cortisone would likely result in difficult to control hyperglycemia and Toradol  may not offer a long duration of relief of symptoms. Patient declined pursuing treatment with either injection today. Additionally, discussed the possibility of a 3 Tesla MRI for better evaluation of patient's labrum, however, labral tests today were negative, therefore, do not necessarily believe it will identify additional pathology. Lastly, discussed the option of formal physical therapy. Given his signs of improvement today, patient has opted to pursue treatment with formal physical therapy at this time.   - Formal physical therapy  - Consider MRI in the future if symptoms persist  - Follow-up as needed   Signe Ravel, MS4 [facility]

## 2024-06-14 ENCOUNTER — Encounter: Payer: Self-pay | Admitting: Family Medicine

## 2024-06-14 DIAGNOSIS — H60392 Other infective otitis externa, left ear: Secondary | ICD-10-CM | POA: Diagnosis not present

## 2024-06-14 DIAGNOSIS — H9202 Otalgia, left ear: Secondary | ICD-10-CM | POA: Diagnosis not present

## 2024-06-15 DIAGNOSIS — F411 Generalized anxiety disorder: Secondary | ICD-10-CM | POA: Diagnosis not present

## 2024-06-22 DIAGNOSIS — Z794 Long term (current) use of insulin: Secondary | ICD-10-CM | POA: Diagnosis not present

## 2024-06-22 DIAGNOSIS — E1069 Type 1 diabetes mellitus with other specified complication: Secondary | ICD-10-CM | POA: Diagnosis not present

## 2024-06-22 DIAGNOSIS — E103593 Type 1 diabetes mellitus with proliferative diabetic retinopathy without macular edema, bilateral: Secondary | ICD-10-CM | POA: Diagnosis not present

## 2024-06-22 DIAGNOSIS — E782 Mixed hyperlipidemia: Secondary | ICD-10-CM | POA: Diagnosis not present

## 2024-06-22 DIAGNOSIS — I10 Essential (primary) hypertension: Secondary | ICD-10-CM | POA: Diagnosis not present

## 2024-06-22 DIAGNOSIS — Z9641 Presence of insulin pump (external) (internal): Secondary | ICD-10-CM | POA: Diagnosis not present

## 2024-06-22 DIAGNOSIS — H60502 Unspecified acute noninfective otitis externa, left ear: Secondary | ICD-10-CM | POA: Diagnosis not present

## 2024-06-22 DIAGNOSIS — E1065 Type 1 diabetes mellitus with hyperglycemia: Secondary | ICD-10-CM | POA: Diagnosis not present

## 2024-06-23 DIAGNOSIS — E1069 Type 1 diabetes mellitus with other specified complication: Secondary | ICD-10-CM | POA: Diagnosis not present

## 2024-07-01 DIAGNOSIS — F411 Generalized anxiety disorder: Secondary | ICD-10-CM | POA: Diagnosis not present

## 2024-07-06 DIAGNOSIS — I10 Essential (primary) hypertension: Secondary | ICD-10-CM | POA: Diagnosis not present

## 2024-07-06 DIAGNOSIS — Z7682 Awaiting organ transplant status: Secondary | ICD-10-CM | POA: Diagnosis not present

## 2024-07-06 DIAGNOSIS — E109 Type 1 diabetes mellitus without complications: Secondary | ICD-10-CM | POA: Diagnosis not present

## 2024-07-06 DIAGNOSIS — E785 Hyperlipidemia, unspecified: Secondary | ICD-10-CM | POA: Diagnosis not present

## 2024-07-07 DIAGNOSIS — M26609 Unspecified temporomandibular joint disorder, unspecified side: Secondary | ICD-10-CM | POA: Diagnosis not present

## 2024-07-07 DIAGNOSIS — H9202 Otalgia, left ear: Secondary | ICD-10-CM | POA: Diagnosis not present

## 2024-07-13 DIAGNOSIS — H43822 Vitreomacular adhesion, left eye: Secondary | ICD-10-CM | POA: Diagnosis not present

## 2024-07-13 DIAGNOSIS — E103553 Type 1 diabetes mellitus with stable proliferative diabetic retinopathy, bilateral: Secondary | ICD-10-CM | POA: Diagnosis not present

## 2024-07-13 DIAGNOSIS — H2513 Age-related nuclear cataract, bilateral: Secondary | ICD-10-CM | POA: Diagnosis not present

## 2024-07-19 DIAGNOSIS — D225 Melanocytic nevi of trunk: Secondary | ICD-10-CM | POA: Diagnosis not present

## 2024-07-19 DIAGNOSIS — L578 Other skin changes due to chronic exposure to nonionizing radiation: Secondary | ICD-10-CM | POA: Diagnosis not present

## 2024-07-19 DIAGNOSIS — L821 Other seborrheic keratosis: Secondary | ICD-10-CM | POA: Diagnosis not present

## 2024-07-19 DIAGNOSIS — L814 Other melanin hyperpigmentation: Secondary | ICD-10-CM | POA: Diagnosis not present

## 2024-07-19 DIAGNOSIS — M25512 Pain in left shoulder: Secondary | ICD-10-CM | POA: Diagnosis not present

## 2024-07-25 DIAGNOSIS — M25512 Pain in left shoulder: Secondary | ICD-10-CM | POA: Diagnosis not present

## 2024-08-03 DIAGNOSIS — M25512 Pain in left shoulder: Secondary | ICD-10-CM | POA: Diagnosis not present

## 2024-08-09 DIAGNOSIS — M25512 Pain in left shoulder: Secondary | ICD-10-CM | POA: Diagnosis not present

## 2024-08-10 ENCOUNTER — Ambulatory Visit: Admitting: Family Medicine

## 2024-08-10 VITALS — BP 114/78 | Ht 72.0 in | Wt 192.0 lb

## 2024-08-10 DIAGNOSIS — M25512 Pain in left shoulder: Secondary | ICD-10-CM | POA: Diagnosis not present

## 2024-08-10 DIAGNOSIS — M7501 Adhesive capsulitis of right shoulder: Secondary | ICD-10-CM

## 2024-08-10 NOTE — Progress Notes (Signed)
 PCP: Jacques Camie Pepper, PA-C  Discussed the use of AI scribe software for clinical note transcription with the patient, who gave verbal consent to proceed.  History of Present Illness [name] is a 43 year old male with diabetes who presents with bilateral frozen shoulder.  Bilateral shoulder pain and stiffness - Persistent pain and weakness in the left shoulder, requiring ibuprofen for pain management and sleep - Pain in the left shoulder is constant and exacerbated by movement and even slight touch - Physical therapy has improved the right shoulder, which is now in the thawing phase with increased range of motion and reduced pain - Despite various treatments the left shoulder remains painful with increased movement - Cautious about preventing further injury to the shoulders - In physical therapy and making progress with the right side  Past Medical History:  Diagnosis Date   ADHD (attention deficit hyperactivity disorder)    Anxiety    Asthma    Autonomic neuropathy due to diabetes (HCC)    Chronic headaches    Diabetes (HCC)    Fatigue    GERD (gastroesophageal reflux disease)    Goiter    High blood pressure    High cholesterol    Hypercholesterolemia    Hypertension    Hypoglycemia associated with diabetes (HCC)    Malnutrition    Obesity    Sleep apnea    mild, no cpap   Tachycardia    Type 1 diabetes mellitus not at goal [facility])    Uncontrolled DM with microalbuminuria or microproteinuria     Current Outpatient Medications on File Prior to Visit  Medication Sig Dispense Refill   acetaZOLAMIDE  ER (DIAMOX ) 500 MG capsule 625 mg.     Albuterol -Budesonide  (AIRSUPRA ) 90-80 MCG/ACT AERO Inhale 2 puffs into the lungs every 4 (four) hours as needed. 10.7 g 1   amoxicillin -clavulanate (AUGMENTIN ) 875-125 MG tablet Take 1 tablet by mouth 2 (two) times daily. 14 tablet 0   atorvastatin  (LIPITOR) 20 MG tablet Take 20 mg by mouth daily.   0   BAQSIMI  TWO  PACK 3 MG/DOSE POWD USE 1 SPRAY INTO THE NOSE AS DIRECTED 2 each 3   Continuous Blood Gluc Receiver (DEXCOM G6 RECEIVER) DEVI 1 Device by Does not apply route as directed. 1 each 2   Continuous Blood Gluc Sensor (DEXCOM G6 SENSOR) MISC INJECT 1 SENSOR UNDER THE SKIN AS DIRECTED( CHANGE SENSOR EVERY 10 DAYS) 3 each 11   Continuous Blood Gluc Sensor (FREESTYLE LIBRE 2 SENSOR) MISC USE EVERY 14 DAYS. USE IF DEXCOM FAILS 2 each 5   Continuous Blood Gluc Transmit (DEXCOM G6 TRANSMITTER) MISC USE AS DIRECTED WITH SENSOR UP TO 8 TIMES 1 each 2   EPINEPHrine  (AUVI-Q ) 0.3 mg/0.3 mL IJ SOAJ injection Use as directed for life-threatening allergic reaction. 2 each 2   fluticasone -salmeterol (WIXELA INHUB) 250-50 MCG/ACT AEPB Inhale 1 puff into the lungs in the morning and at bedtime. 60 each 5   HUMALOG  100 UNIT/ML injection Use 300 units in insulin  pump every 48 hours 120 mL 1   insulin  aspart (NOVOLOG ) 100 UNIT/ML injection Use up to 300 units in pump every 48 hours 120 mL 3   Insulin  Lispro Junior KwikPen (HUMALOG  JR) 100 UNIT/ML KwikPen ADMINISTER UP TO 50 UNITS UNDER THE SKIN DAILY AS DIRECTED BY PRESCRIBER 15 mL 11   Insulin  Pen Needle (PEN NEEDLES) 32G X 4 MM MISC Use to inject insulin  6x per day 200 each 5   levocetirizine (XYZAL )  5 MG tablet Take 1 tablet (5 mg total) by mouth daily as needed for allergies (Can take an extra dose during flare ups.). 180 tablet 1   lipase/protease/amylase (CREON ) 36000 UNITS CPEP capsule 3 pills with every meal and 2 pills with every snack 390 capsule 11   losartan  (COZAAR ) 50 MG tablet TAKE 1 TABLET(50 MG) BY MOUTH DAILY 90 tablet 1   Multiple Vitamin (MULTIVITAMIN WITH MINERALS) TABS tablet Take 1 tablet by mouth daily.     omeprazole  (PRILOSEC) 20 MG capsule Take 1 capsule (20 mg total) by mouth daily. 30 capsule 11   ONETOUCH VERIO test strip USE 1 STRIP TO TEST BLOOD SUGAR 8 TIMES DAILY 250 strip 3   topiramate  (TOPAMAX ) 50 MG tablet Take 1 tablet (50 mg total) by  mouth 2 (two) times daily. Please call and make overdue appt for further refills. 2nd attempt 30 tablet 0   Vitamin D, Ergocalciferol, (DRISDOL) 1.25 MG (50000 UNIT) CAPS capsule Take 50,000 Units by mouth every 7 (seven) days.     Current Facility-Administered Medications on File Prior to Visit  Medication Dose Route Frequency Provider Last Rate Last Admin   gadopentetate dimeglumine  (MAGNEVIST ) injection 20 mL  20 mL Intravenous Once PRN Dohmeier, Dedra, MD        Past Surgical History:  Procedure Laterality Date   EYE SURGERY Right 05/30/2020   Vitrectomy, Dr. Elner   EYE SURGERY  04/2020   IR 3D INDEPENDENT WKST  07/16/2020   IR ANGIO INTRA EXTRACRAN SEL COM CAROTID INNOMINATE BILAT MOD SED  07/16/2020   IR ANGIO VERTEBRAL SEL SUBCLAVIAN INNOMINATE UNI L MOD SED  07/16/2020   IR ANGIO VERTEBRAL SEL VERTEBRAL UNI R MOD SED  07/16/2020   IR US  GUIDE VASC ACCESS RIGHT  07/16/2020   REFRACTIVE SURGERY     x9   WISDOM TOOTH EXTRACTION      Allergies  Allergen Reactions   Fluticasone  Shortness Of Breath and Cough    Tachycardia   Sulfa Antibiotics Nausea Only and Shortness Of Breath    Shortness of breath, increased heart rate; happened quite a while ago   Sulfites Itching, Swelling, Nausea And Vomiting, Cough and Palpitations    Tachycardia, flushing   Lactose Nausea Only, Diarrhea and Nausea And Vomiting    Upset stomach   Molds & Smuts Other (See Comments), Cough and Rash    Aggravate asthma   Tetracyclines & Related Other (See Comments)    increased intracranial pressure  Other Reaction(s): craninal pressure increase  Increased Intracranial pressure    increased intracranial pressure  increased intracranial pressure, increased intracranial pressure   Betula Alba Oil Rash   Lactose Intolerance (Gi) Diarrhea and Nausea And Vomiting    Upset stomach   Pollen Extract Cough   White [place]ch Cough    BP 114/78   Ht 6' (1.829 m)   Wt 192 lb (87.1 kg)   BMI 26.04 kg/m       09/11/2020    9:45 AM 06/24/2021   10:44 AM 08/05/2021   10:00 AM 09/16/2021   10:08 AM  [facility]  Frequency of aerobic exercise (# of days/week) 5 3 3 3   Average time in minutes 30 15 15 15   Frequency of strengthening activities (# of days/week) 0 1 1 1         No data to display              Objective:  Physical Exam:  Gen: NAD,  comfortable in exam room  Right shoulder: No swelling, ecchymoses.  No gross deformity. No TTP. Still very limited IR and ER.  Abduction and flexion to about 90 degrees. Strength 5/5 with empty can and resisted internal/external rotation. NV intact distally.  Left shoulder: No swelling, ecchymoses.  No gross deformity. Diffuse tenderness. ER and IR minimal even passively.  Flexion and extension to about 70 degrees, painful. Positive Hawkins. Strength 5/5 with empty can and resisted internal/external rotation. NV intact distally.    Assessment & Plan Left shoulder adhesive capsulitis (frozen shoulder) Left shoulder in first phase of adhesive capsulitis with constant pain. Diabetes puts him at increased risk. MRI considered for further evaluation. - Order MRI of the left shoulder to evaluate for potential labrum tear or other underlying issues given he's not improving with over 6 weeks of physical therapy and home exercise program. - Continue with physical therapy and rehabilitation exercises in meantime.  Right shoulder adhesive capsulitis (frozen shoulder), improving Right shoulder in thawing phase with improved movement and reduced pain. Improvement noted from physical therapy. Previous fracture led to frozen shoulder, now improving with rehabilitation. - Continue with physical therapy and rehabilitation exercises to maintain and improve shoulder mobility.

## 2024-08-19 DIAGNOSIS — M25512 Pain in left shoulder: Secondary | ICD-10-CM | POA: Diagnosis not present

## 2024-08-24 DIAGNOSIS — K8681 Exocrine pancreatic insufficiency: Secondary | ICD-10-CM | POA: Diagnosis not present

## 2024-08-27 ENCOUNTER — Ambulatory Visit
Admission: RE | Admit: 2024-08-27 | Discharge: 2024-08-27 | Disposition: A | Source: Ambulatory Visit | Attending: Family Medicine | Admitting: Family Medicine

## 2024-08-27 DIAGNOSIS — M7582 Other shoulder lesions, left shoulder: Secondary | ICD-10-CM | POA: Diagnosis not present

## 2024-08-27 DIAGNOSIS — M25512 Pain in left shoulder: Secondary | ICD-10-CM

## 2024-08-29 DIAGNOSIS — M25512 Pain in left shoulder: Secondary | ICD-10-CM | POA: Diagnosis not present

## 2024-08-29 DIAGNOSIS — M25511 Pain in right shoulder: Secondary | ICD-10-CM | POA: Diagnosis not present

## 2024-08-31 DIAGNOSIS — F411 Generalized anxiety disorder: Secondary | ICD-10-CM | POA: Diagnosis not present

## 2024-09-01 ENCOUNTER — Ambulatory Visit: Payer: Self-pay | Admitting: Family Medicine

## 2024-09-05 DIAGNOSIS — Z01818 Encounter for other preprocedural examination: Secondary | ICD-10-CM | POA: Diagnosis not present

## 2024-09-08 DIAGNOSIS — K115 Sialolithiasis: Secondary | ICD-10-CM | POA: Diagnosis not present

## 2024-09-12 DIAGNOSIS — M25511 Pain in right shoulder: Secondary | ICD-10-CM | POA: Diagnosis not present

## 2024-09-14 DIAGNOSIS — F411 Generalized anxiety disorder: Secondary | ICD-10-CM | POA: Diagnosis not present

## 2024-09-19 DIAGNOSIS — F411 Generalized anxiety disorder: Secondary | ICD-10-CM | POA: Diagnosis not present

## 2024-09-21 DIAGNOSIS — M25511 Pain in right shoulder: Secondary | ICD-10-CM | POA: Diagnosis not present

## 2024-09-21 DIAGNOSIS — M25512 Pain in left shoulder: Secondary | ICD-10-CM | POA: Diagnosis not present

## 2024-10-03 ENCOUNTER — Encounter: Payer: Self-pay | Admitting: Family Medicine

## 2024-10-04 DIAGNOSIS — E103593 Type 1 diabetes mellitus with proliferative diabetic retinopathy without macular edema, bilateral: Secondary | ICD-10-CM | POA: Diagnosis not present

## 2024-10-04 DIAGNOSIS — H02886 Meibomian gland dysfunction of left eye, unspecified eyelid: Secondary | ICD-10-CM | POA: Diagnosis not present

## 2024-10-04 DIAGNOSIS — H02883 Meibomian gland dysfunction of right eye, unspecified eyelid: Secondary | ICD-10-CM | POA: Diagnosis not present

## 2024-10-04 DIAGNOSIS — H3562 Retinal hemorrhage, left eye: Secondary | ICD-10-CM | POA: Diagnosis not present

## 2024-10-05 DIAGNOSIS — H4312 Vitreous hemorrhage, left eye: Secondary | ICD-10-CM | POA: Diagnosis not present

## 2024-10-05 DIAGNOSIS — E103552 Type 1 diabetes mellitus with stable proliferative diabetic retinopathy, left eye: Secondary | ICD-10-CM | POA: Diagnosis not present

## 2024-10-05 DIAGNOSIS — H43822 Vitreomacular adhesion, left eye: Secondary | ICD-10-CM | POA: Diagnosis not present

## 2024-10-05 DIAGNOSIS — E103551 Type 1 diabetes mellitus with stable proliferative diabetic retinopathy, right eye: Secondary | ICD-10-CM | POA: Diagnosis not present

## 2024-10-05 DIAGNOSIS — F411 Generalized anxiety disorder: Secondary | ICD-10-CM | POA: Diagnosis not present

## 2024-10-05 DIAGNOSIS — G932 Benign intracranial hypertension: Secondary | ICD-10-CM | POA: Diagnosis not present

## 2024-10-06 DIAGNOSIS — Z9641 Presence of insulin pump (external) (internal): Secondary | ICD-10-CM | POA: Diagnosis not present

## 2024-10-06 DIAGNOSIS — Z5309 Procedure and treatment not carried out because of other contraindication: Secondary | ICD-10-CM | POA: Diagnosis not present

## 2024-10-06 DIAGNOSIS — Z01818 Encounter for other preprocedural examination: Secondary | ICD-10-CM | POA: Diagnosis not present

## 2024-10-06 DIAGNOSIS — K8689 Other specified diseases of pancreas: Secondary | ICD-10-CM | POA: Diagnosis not present

## 2024-10-06 DIAGNOSIS — E109 Type 1 diabetes mellitus without complications: Secondary | ICD-10-CM | POA: Diagnosis not present

## 2024-10-06 DIAGNOSIS — E103592 Type 1 diabetes mellitus with proliferative diabetic retinopathy without macular edema, left eye: Secondary | ICD-10-CM | POA: Diagnosis not present

## 2024-10-06 DIAGNOSIS — I10 Essential (primary) hypertension: Secondary | ICD-10-CM | POA: Diagnosis not present

## 2024-10-10 ENCOUNTER — Ambulatory Visit: Admitting: Family Medicine

## 2024-10-10 VITALS — BP 124/82 | Ht 72.0 in | Wt 192.0 lb

## 2024-10-10 DIAGNOSIS — M7501 Adhesive capsulitis of right shoulder: Secondary | ICD-10-CM

## 2024-10-10 DIAGNOSIS — M7502 Adhesive capsulitis of left shoulder: Secondary | ICD-10-CM

## 2024-10-10 NOTE — Progress Notes (Unsigned)
 PCP: Jacques Camie Pepper, PA-C  Patient is a 43 y.o. male with PMH of diabetes here for bilateral frozen shoulders.  Bilateral shoulder pain and stiffness, frozen shoulders - Patient continues rehab for L frozen shoulder. - Also has R frozen shoulder, which is improving with rehab. - Patient continues to make progress on his right side. - Main triggers are fast movements. - Patient is unable to sleep on his side still. 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   Past Medical History:  Diagnosis Date   ADHD (attention deficit hyperactivity disorder)    Anxiety    Asthma    Autonomic neuropathy due to diabetes (HCC)    Chronic headaches    Diabetes (HCC)    Fatigue    GERD (gastroesophageal reflux disease)    Goiter    High blood pressure    High cholesterol    Hypercholesterolemia    Hypertension    Hypoglycemia associated with diabetes (HCC)    Malnutrition    Obesity    Sleep apnea    mild, no cpap   Tachycardia    Type 1 diabetes mellitus not at goal [facility])    Uncontrolled DM with microalbuminuria or microproteinuria     Medications Ordered Prior to Encounter[1]  Past Surgical History:  Procedure Laterality Date   EYE SURGERY Right 05/30/2020   Vitrectomy, Dr. Elner   EYE SURGERY  04/2020   IR 3D INDEPENDENT WKST  07/16/2020   IR ANGIO INTRA EXTRACRAN SEL COM CAROTID INNOMINATE BILAT MOD SED  07/16/2020   IR ANGIO VERTEBRAL SEL SUBCLAVIAN INNOMINATE UNI L MOD SED  07/16/2020   IR ANGIO VERTEBRAL SEL VERTEBRAL UNI R MOD SED  07/16/2020   IR US  GUIDE VASC ACCESS RIGHT  07/16/2020   REFRACTIVE SURGERY     x9   WISDOM TOOTH EXTRACTION      Allergies[2]  BP 124/82   Ht 6' (1.829 m)   Wt 192 lb (87.1 kg)   BMI 26.04 kg/m      09/11/2020    9:45 AM 06/24/2021   10:44 AM 08/05/2021   10:00 AM 09/16/2021   10:08 AM  [facility] Adult Exercise  Frequency of aerobic exercise (# of days/week) 5 3 3 3   Average  time in minutes 30 15 15 15   Frequency of strengthening activities (# of days/week) 0 1 1 1         No data to display              Objective:  Physical Exam:  Gen: NAD, comfortable in exam room  Location: Bilateral shoulders - Inspection: No swelling, edema, severe deformity, other overlying skin changes - Palpation: No TTP over AC joint, anterior shoulder, posterior scapula - ROM: Minimal passive external rotation (R>L), improved active internal rotation (R able to touch back, L close), flexion and extension improved to 100 deg R and 80 deg L - Strength: 5/5 extension/flexion and internal/external rotation (inc w/ resistance) - Special Tests: 5/5 strength and no pain with empty can - Neurovascular: Intact distally, 2+ radial pulses bilaterally   Assessment and Plan:   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  Right shoulder is improving and is in thawing phase.  Left shoulder slowly improving and likely in frozen phase. - Continue physical therapy, ambulatory referral resent     [1]  Current Outpatient Medications on File Prior to Visit  Medication Sig Dispense Refill  acetaZOLAMIDE  ER (DIAMOX ) 500 MG capsule 625 mg.     Albuterol -Budesonide  (AIRSUPRA ) 90-80 MCG/ACT AERO Inhale 2 puffs into the lungs every 4 (four) hours as needed. 10.7 g 1   amoxicillin -clavulanate (AUGMENTIN ) 875-125 MG tablet Take 1 tablet by mouth 2 (two) times daily. 14 tablet 0   atorvastatin  (LIPITOR) 20 MG tablet Take 20 mg by mouth daily.   0   BAQSIMI  TWO PACK 3 MG/DOSE POWD USE 1 SPRAY INTO THE NOSE AS DIRECTED 2 each 3   Continuous Blood Gluc Receiver (DEXCOM G6 RECEIVER) DEVI 1 Device by Does not apply route as directed. 1 each 2   Continuous Blood Gluc Sensor (DEXCOM G6 SENSOR) MISC INJECT 1 SENSOR UNDER THE SKIN AS DIRECTED( CHANGE SENSOR EVERY 10 DAYS) 3 each 11   Continuous Blood Gluc Sensor (FREESTYLE LIBRE 2 SENSOR) MISC USE EVERY 14 DAYS. USE IF DEXCOM  FAILS 2 each 5   Continuous Blood Gluc Transmit (DEXCOM G6 TRANSMITTER) MISC USE AS DIRECTED WITH SENSOR UP TO 8 TIMES 1 each 2   EPINEPHrine  (AUVI-Q ) 0.3 mg/0.3 mL IJ SOAJ injection Use as directed for life-threatening allergic reaction. 2 each 2   fluticasone -salmeterol (WIXELA INHUB) 250-50 MCG/ACT AEPB Inhale 1 puff into the lungs in the morning and at bedtime. 60 each 5   HUMALOG  100 UNIT/ML injection Use 300 units in insulin  pump every 48 hours 120 mL 1   insulin  aspart (NOVOLOG ) 100 UNIT/ML injection Use up to 300 units in pump every 48 hours 120 mL 3   Insulin  Lispro Junior KwikPen (HUMALOG  JR) 100 UNIT/ML KwikPen ADMINISTER UP TO 50 UNITS UNDER THE SKIN DAILY AS DIRECTED BY PRESCRIBER 15 mL 11   Insulin  Pen Needle (PEN NEEDLES) 32G X 4 MM MISC Use to inject insulin  6x per day 200 each 5   levocetirizine (XYZAL ) 5 MG tablet Take 1 tablet (5 mg total) by mouth daily as needed for allergies (Can take an extra dose during flare ups.). 180 tablet 1   lipase/protease/amylase (CREON ) 36000 UNITS CPEP capsule 3 pills with every meal and 2 pills with every snack 390 capsule 11   losartan  (COZAAR ) 50 MG tablet TAKE 1 TABLET(50 MG) BY MOUTH DAILY 90 tablet 1   Multiple Vitamin (MULTIVITAMIN WITH MINERALS) TABS tablet Take 1 tablet by mouth daily.     omeprazole  (PRILOSEC) 20 MG capsule Take 1 capsule (20 mg total) by mouth daily. 30 capsule 11   ONETOUCH VERIO test strip USE 1 STRIP TO TEST BLOOD SUGAR 8 TIMES DAILY 250 strip 3   topiramate  (TOPAMAX ) 50 MG tablet Take 1 tablet (50 mg total) by mouth 2 (two) times daily. Please call and make overdue appt for further refills. 2nd attempt 30 tablet 0   Vitamin D, Ergocalciferol, (DRISDOL) 1.25 MG (50000 UNIT) CAPS capsule Take 50,000 Units by mouth every 7 (seven) days.     Current Facility-Administered Medications on File Prior to Visit  Medication Dose Route Frequency Provider Last Rate Last Admin   gadopentetate dimeglumine  (MAGNEVIST ) injection 20  mL  20 mL Intravenous Once PRN Dohmeier, Dedra, MD      [2]  Allergies Allergen Reactions   Fluticasone  Shortness Of Breath and Cough    Tachycardia   Sulfa Antibiotics Nausea Only and Shortness Of Breath    Shortness of breath, increased heart rate; happened quite a while ago   Sulfites Itching, Swelling, Nausea And Vomiting, Cough and Palpitations    Tachycardia, flushing   Lactose Nausea Only, Diarrhea and Nausea And  Vomiting    Upset stomach   Molds & Smuts Other (See Comments), Cough and Rash    Aggravate asthma   Tetracyclines & Related Other (See Comments)    increased intracranial pressure  Other Reaction(s): craninal pressure increase  Increased Intracranial pressure    increased intracranial pressure  increased intracranial pressure, increased intracranial pressure   Betula [place] Oil Rash   Lactose Intolerance (Gi) Diarrhea and Nausea And Vomiting    Upset stomach   Pollen Extract Cough   [place] Cough

## 2024-10-12 DIAGNOSIS — F411 Generalized anxiety disorder: Secondary | ICD-10-CM | POA: Diagnosis not present

## 2024-10-18 DIAGNOSIS — E109 Type 1 diabetes mellitus without complications: Secondary | ICD-10-CM | POA: Diagnosis not present
